# Patient Record
Sex: Female | Born: 1955 | Race: White | Hispanic: No | Smoking: Never smoker
Health system: Southern US, Community
[De-identification: ages and names within clinical notes are randomized; demographics above are authoritative.]

## PROBLEM LIST (undated history)

## (undated) ENCOUNTER — Emergency Department (HOSPITAL_BASED_OUTPATIENT_CLINIC_OR_DEPARTMENT_OTHER): Admission: EM | Payer: Medicare Other | Source: Home / Self Care

## (undated) DIAGNOSIS — D649 Anemia, unspecified: Secondary | ICD-10-CM

## (undated) DIAGNOSIS — R51 Headache: Secondary | ICD-10-CM

## (undated) DIAGNOSIS — T7840XA Allergy, unspecified, initial encounter: Secondary | ICD-10-CM

## (undated) DIAGNOSIS — K449 Diaphragmatic hernia without obstruction or gangrene: Secondary | ICD-10-CM

## (undated) DIAGNOSIS — F329 Major depressive disorder, single episode, unspecified: Secondary | ICD-10-CM

## (undated) DIAGNOSIS — Z8669 Personal history of other diseases of the nervous system and sense organs: Secondary | ICD-10-CM

## (undated) DIAGNOSIS — F419 Anxiety disorder, unspecified: Secondary | ICD-10-CM

## (undated) DIAGNOSIS — M2550 Pain in unspecified joint: Secondary | ICD-10-CM

## (undated) DIAGNOSIS — D329 Benign neoplasm of meninges, unspecified: Secondary | ICD-10-CM

## (undated) DIAGNOSIS — Z87898 Personal history of other specified conditions: Secondary | ICD-10-CM

## (undated) DIAGNOSIS — K8020 Calculus of gallbladder without cholecystitis without obstruction: Secondary | ICD-10-CM

## (undated) DIAGNOSIS — K279 Peptic ulcer, site unspecified, unspecified as acute or chronic, without hemorrhage or perforation: Secondary | ICD-10-CM

## (undated) DIAGNOSIS — R011 Cardiac murmur, unspecified: Secondary | ICD-10-CM

## (undated) DIAGNOSIS — Z8719 Personal history of other diseases of the digestive system: Secondary | ICD-10-CM

## (undated) DIAGNOSIS — M1990 Unspecified osteoarthritis, unspecified site: Secondary | ICD-10-CM

## (undated) DIAGNOSIS — M549 Dorsalgia, unspecified: Secondary | ICD-10-CM

## (undated) DIAGNOSIS — M2540 Effusion, unspecified joint: Secondary | ICD-10-CM

## (undated) DIAGNOSIS — K315 Obstruction of duodenum: Secondary | ICD-10-CM

## (undated) DIAGNOSIS — F32A Depression, unspecified: Secondary | ICD-10-CM

## (undated) DIAGNOSIS — IMO0002 Reserved for concepts with insufficient information to code with codable children: Secondary | ICD-10-CM

## (undated) DIAGNOSIS — K219 Gastro-esophageal reflux disease without esophagitis: Secondary | ICD-10-CM

## (undated) DIAGNOSIS — Z8711 Personal history of peptic ulcer disease: Secondary | ICD-10-CM

## (undated) DIAGNOSIS — R519 Headache, unspecified: Secondary | ICD-10-CM

## (undated) HISTORY — PX: REPLACEMENT TOTAL KNEE: SUR1224

## (undated) HISTORY — DX: Headache, unspecified: R51.9

## (undated) HISTORY — DX: Anemia, unspecified: D64.9

## (undated) HISTORY — PX: COLONOSCOPY: SHX174

## (undated) HISTORY — DX: Obstruction of duodenum: K31.5

## (undated) HISTORY — PX: DIAGNOSTIC LAPAROSCOPY: SUR761

## (undated) HISTORY — DX: Peptic ulcer, site unspecified, unspecified as acute or chronic, without hemorrhage or perforation: K27.9

## (undated) HISTORY — DX: Benign neoplasm of meninges, unspecified: D32.9

## (undated) HISTORY — DX: Cardiac murmur, unspecified: R01.1

## (undated) HISTORY — DX: Calculus of gallbladder without cholecystitis without obstruction: K80.20

## (undated) HISTORY — DX: Reserved for concepts with insufficient information to code with codable children: IMO0002

## (undated) HISTORY — DX: Diaphragmatic hernia without obstruction or gangrene: K44.9

## (undated) HISTORY — DX: Allergy, unspecified, initial encounter: T78.40XA

---

## 1998-11-28 ENCOUNTER — Emergency Department (HOSPITAL_COMMUNITY): Admission: EM | Admit: 1998-11-28 | Discharge: 1998-11-28 | Payer: Self-pay | Admitting: Emergency Medicine

## 1998-12-03 ENCOUNTER — Emergency Department (HOSPITAL_COMMUNITY): Admission: EM | Admit: 1998-12-03 | Discharge: 1998-12-03 | Payer: Self-pay | Admitting: Emergency Medicine

## 1999-02-27 ENCOUNTER — Encounter: Payer: Self-pay | Admitting: Orthopedic Surgery

## 1999-02-27 ENCOUNTER — Ambulatory Visit (HOSPITAL_COMMUNITY): Admission: RE | Admit: 1999-02-27 | Discharge: 1999-02-27 | Payer: Self-pay | Admitting: Orthopedic Surgery

## 2000-04-11 ENCOUNTER — Other Ambulatory Visit: Admission: RE | Admit: 2000-04-11 | Discharge: 2000-04-11 | Payer: Self-pay | Admitting: *Deleted

## 2001-04-16 ENCOUNTER — Encounter (INDEPENDENT_AMBULATORY_CARE_PROVIDER_SITE_OTHER): Payer: Self-pay | Admitting: Specialist

## 2001-04-16 ENCOUNTER — Ambulatory Visit (HOSPITAL_COMMUNITY): Admission: RE | Admit: 2001-04-16 | Discharge: 2001-04-16 | Payer: Self-pay | Admitting: Urology

## 2001-06-03 ENCOUNTER — Encounter (INDEPENDENT_AMBULATORY_CARE_PROVIDER_SITE_OTHER): Payer: Self-pay

## 2001-06-03 ENCOUNTER — Ambulatory Visit (HOSPITAL_COMMUNITY): Admission: RE | Admit: 2001-06-03 | Discharge: 2001-06-03 | Payer: Self-pay | Admitting: Obstetrics and Gynecology

## 2001-11-18 ENCOUNTER — Emergency Department (HOSPITAL_COMMUNITY): Admission: EM | Admit: 2001-11-18 | Discharge: 2001-11-18 | Payer: Self-pay | Admitting: Emergency Medicine

## 2001-11-18 ENCOUNTER — Encounter: Payer: Self-pay | Admitting: Emergency Medicine

## 2002-04-29 ENCOUNTER — Encounter: Admission: RE | Admit: 2002-04-29 | Discharge: 2002-04-29 | Payer: Self-pay | Admitting: Internal Medicine

## 2002-06-07 ENCOUNTER — Emergency Department (HOSPITAL_COMMUNITY): Admission: EM | Admit: 2002-06-07 | Discharge: 2002-06-08 | Payer: Self-pay | Admitting: *Deleted

## 2002-06-08 ENCOUNTER — Encounter: Payer: Self-pay | Admitting: Emergency Medicine

## 2002-06-09 ENCOUNTER — Encounter: Admission: RE | Admit: 2002-06-09 | Discharge: 2002-06-09 | Payer: Self-pay | Admitting: Internal Medicine

## 2002-06-10 ENCOUNTER — Encounter: Payer: Self-pay | Admitting: Emergency Medicine

## 2002-06-10 ENCOUNTER — Emergency Department (HOSPITAL_COMMUNITY): Admission: EM | Admit: 2002-06-10 | Discharge: 2002-06-10 | Payer: Self-pay | Admitting: Emergency Medicine

## 2002-06-14 ENCOUNTER — Encounter: Admission: RE | Admit: 2002-06-14 | Discharge: 2002-06-14 | Payer: Self-pay | Admitting: Internal Medicine

## 2002-06-15 ENCOUNTER — Ambulatory Visit (HOSPITAL_COMMUNITY): Admission: RE | Admit: 2002-06-15 | Discharge: 2002-06-15 | Payer: Self-pay | Admitting: Internal Medicine

## 2002-06-15 ENCOUNTER — Encounter: Payer: Self-pay | Admitting: Internal Medicine

## 2002-06-16 ENCOUNTER — Encounter: Admission: RE | Admit: 2002-06-16 | Discharge: 2002-06-16 | Payer: Self-pay | Admitting: Internal Medicine

## 2002-06-18 ENCOUNTER — Ambulatory Visit (HOSPITAL_COMMUNITY): Admission: RE | Admit: 2002-06-18 | Discharge: 2002-06-18 | Payer: Self-pay | Admitting: Internal Medicine

## 2002-06-18 ENCOUNTER — Encounter: Payer: Self-pay | Admitting: Internal Medicine

## 2002-06-30 ENCOUNTER — Encounter: Admission: RE | Admit: 2002-06-30 | Discharge: 2002-06-30 | Payer: Self-pay | Admitting: Internal Medicine

## 2002-07-19 ENCOUNTER — Encounter: Admission: RE | Admit: 2002-07-19 | Discharge: 2002-07-19 | Payer: Self-pay | Admitting: Internal Medicine

## 2002-07-21 ENCOUNTER — Encounter: Admission: RE | Admit: 2002-07-21 | Discharge: 2002-07-21 | Payer: Self-pay | Admitting: Internal Medicine

## 2002-08-19 ENCOUNTER — Encounter: Admission: RE | Admit: 2002-08-19 | Discharge: 2002-08-19 | Payer: Self-pay | Admitting: Internal Medicine

## 2002-09-08 ENCOUNTER — Emergency Department (HOSPITAL_COMMUNITY): Admission: EM | Admit: 2002-09-08 | Discharge: 2002-09-08 | Payer: Self-pay | Admitting: *Deleted

## 2002-09-29 ENCOUNTER — Encounter: Admission: RE | Admit: 2002-09-29 | Discharge: 2002-09-29 | Payer: Self-pay | Admitting: Internal Medicine

## 2002-10-07 ENCOUNTER — Encounter: Admission: RE | Admit: 2002-10-07 | Discharge: 2002-10-07 | Payer: Self-pay | Admitting: Internal Medicine

## 2002-12-02 ENCOUNTER — Encounter: Admission: RE | Admit: 2002-12-02 | Discharge: 2002-12-02 | Payer: Self-pay | Admitting: Internal Medicine

## 2002-12-22 ENCOUNTER — Encounter: Payer: Self-pay | Admitting: Internal Medicine

## 2002-12-22 ENCOUNTER — Encounter: Admission: RE | Admit: 2002-12-22 | Discharge: 2002-12-22 | Payer: Self-pay | Admitting: Internal Medicine

## 2003-01-03 ENCOUNTER — Encounter: Admission: RE | Admit: 2003-01-03 | Discharge: 2003-01-03 | Payer: Self-pay | Admitting: Internal Medicine

## 2003-01-12 ENCOUNTER — Encounter: Admission: RE | Admit: 2003-01-12 | Discharge: 2003-01-12 | Payer: Self-pay | Admitting: Internal Medicine

## 2003-02-04 ENCOUNTER — Ambulatory Visit (HOSPITAL_COMMUNITY): Admission: RE | Admit: 2003-02-04 | Discharge: 2003-02-04 | Payer: Self-pay | Admitting: *Deleted

## 2003-03-11 ENCOUNTER — Encounter: Admission: RE | Admit: 2003-03-11 | Discharge: 2003-03-11 | Payer: Self-pay | Admitting: Internal Medicine

## 2003-07-18 ENCOUNTER — Encounter: Admission: RE | Admit: 2003-07-18 | Discharge: 2003-07-18 | Payer: Self-pay | Admitting: Internal Medicine

## 2003-12-04 ENCOUNTER — Ambulatory Visit (HOSPITAL_COMMUNITY): Admission: RE | Admit: 2003-12-04 | Discharge: 2003-12-04 | Payer: Self-pay | Admitting: Internal Medicine

## 2004-02-28 ENCOUNTER — Ambulatory Visit (HOSPITAL_COMMUNITY): Admission: RE | Admit: 2004-02-28 | Discharge: 2004-02-28 | Payer: Self-pay | Admitting: *Deleted

## 2004-08-14 ENCOUNTER — Ambulatory Visit: Payer: Self-pay | Admitting: Internal Medicine

## 2004-08-27 ENCOUNTER — Ambulatory Visit: Payer: Self-pay | Admitting: Oncology

## 2004-09-18 ENCOUNTER — Ambulatory Visit: Payer: Self-pay | Admitting: Internal Medicine

## 2004-09-20 ENCOUNTER — Encounter (INDEPENDENT_AMBULATORY_CARE_PROVIDER_SITE_OTHER): Payer: Self-pay | Admitting: Specialist

## 2004-09-20 ENCOUNTER — Ambulatory Visit (HOSPITAL_COMMUNITY): Admission: RE | Admit: 2004-09-20 | Discharge: 2004-09-20 | Payer: Self-pay | Admitting: Oncology

## 2004-09-25 ENCOUNTER — Ambulatory Visit (HOSPITAL_COMMUNITY): Admission: RE | Admit: 2004-09-25 | Discharge: 2004-09-25 | Payer: Self-pay | Admitting: Oncology

## 2004-10-25 ENCOUNTER — Ambulatory Visit: Payer: Self-pay | Admitting: Internal Medicine

## 2004-11-22 ENCOUNTER — Ambulatory Visit: Payer: Self-pay | Admitting: Internal Medicine

## 2004-11-27 ENCOUNTER — Ambulatory Visit: Payer: Self-pay | Admitting: Internal Medicine

## 2004-12-18 ENCOUNTER — Ambulatory Visit (HOSPITAL_COMMUNITY): Admission: RE | Admit: 2004-12-18 | Discharge: 2004-12-18 | Payer: Self-pay | Admitting: Orthopedic Surgery

## 2005-09-30 ENCOUNTER — Ambulatory Visit (HOSPITAL_BASED_OUTPATIENT_CLINIC_OR_DEPARTMENT_OTHER): Admission: RE | Admit: 2005-09-30 | Discharge: 2005-09-30 | Payer: Self-pay | Admitting: Orthopedic Surgery

## 2006-06-03 ENCOUNTER — Encounter: Admission: RE | Admit: 2006-06-03 | Discharge: 2006-06-19 | Payer: Self-pay | Admitting: Orthopedic Surgery

## 2006-06-06 ENCOUNTER — Encounter: Admission: RE | Admit: 2006-06-06 | Discharge: 2006-06-06 | Payer: Self-pay | Admitting: Family Medicine

## 2006-06-23 ENCOUNTER — Emergency Department (HOSPITAL_COMMUNITY): Admission: EM | Admit: 2006-06-23 | Discharge: 2006-06-23 | Payer: Self-pay | Admitting: Emergency Medicine

## 2006-09-22 ENCOUNTER — Inpatient Hospital Stay (HOSPITAL_COMMUNITY): Admission: RE | Admit: 2006-09-22 | Discharge: 2006-09-25 | Payer: Self-pay | Admitting: Orthopedic Surgery

## 2006-10-11 ENCOUNTER — Emergency Department (HOSPITAL_COMMUNITY): Admission: EM | Admit: 2006-10-11 | Discharge: 2006-10-12 | Payer: Self-pay | Admitting: Emergency Medicine

## 2006-11-03 ENCOUNTER — Encounter: Admission: RE | Admit: 2006-11-03 | Discharge: 2006-11-19 | Payer: Self-pay | Admitting: Orthopedic Surgery

## 2007-02-19 ENCOUNTER — Observation Stay (HOSPITAL_COMMUNITY): Admission: EM | Admit: 2007-02-19 | Discharge: 2007-02-20 | Payer: Self-pay | Admitting: Emergency Medicine

## 2007-06-03 ENCOUNTER — Encounter: Admission: RE | Admit: 2007-06-03 | Discharge: 2007-08-10 | Payer: Self-pay | Admitting: Orthopedic Surgery

## 2007-07-07 ENCOUNTER — Encounter: Admission: RE | Admit: 2007-07-07 | Discharge: 2007-07-07 | Payer: Self-pay | Admitting: Family Medicine

## 2007-07-14 ENCOUNTER — Encounter: Admission: RE | Admit: 2007-07-14 | Discharge: 2007-07-14 | Payer: Self-pay | Admitting: Nephrology

## 2007-08-09 ENCOUNTER — Emergency Department (HOSPITAL_COMMUNITY): Admission: EM | Admit: 2007-08-09 | Discharge: 2007-08-09 | Payer: Self-pay | Admitting: Family Medicine

## 2007-08-10 ENCOUNTER — Emergency Department (HOSPITAL_COMMUNITY): Admission: EM | Admit: 2007-08-10 | Discharge: 2007-08-10 | Payer: Self-pay | Admitting: *Deleted

## 2008-05-29 ENCOUNTER — Emergency Department (HOSPITAL_COMMUNITY): Admission: EM | Admit: 2008-05-29 | Discharge: 2008-05-29 | Payer: Self-pay | Admitting: Family Medicine

## 2008-10-23 ENCOUNTER — Ambulatory Visit (HOSPITAL_BASED_OUTPATIENT_CLINIC_OR_DEPARTMENT_OTHER): Admission: RE | Admit: 2008-10-23 | Discharge: 2008-10-23 | Payer: Self-pay | Admitting: Otolaryngology

## 2008-10-30 ENCOUNTER — Ambulatory Visit: Payer: Self-pay | Admitting: Internal Medicine

## 2009-08-31 ENCOUNTER — Encounter: Admission: RE | Admit: 2009-08-31 | Discharge: 2009-08-31 | Payer: Self-pay | Admitting: Family Medicine

## 2010-01-12 ENCOUNTER — Emergency Department (HOSPITAL_COMMUNITY): Admission: EM | Admit: 2010-01-12 | Discharge: 2010-01-12 | Payer: Self-pay | Admitting: Family Medicine

## 2010-07-19 LAB — HM COLONOSCOPY

## 2010-08-09 ENCOUNTER — Emergency Department (HOSPITAL_COMMUNITY): Admission: EM | Admit: 2010-08-09 | Discharge: 2010-08-09 | Payer: Self-pay | Admitting: Emergency Medicine

## 2010-09-20 ENCOUNTER — Encounter
Admission: RE | Admit: 2010-09-20 | Discharge: 2010-09-20 | Payer: Self-pay | Source: Home / Self Care | Attending: Family Medicine | Admitting: Family Medicine

## 2010-10-02 ENCOUNTER — Encounter
Admission: RE | Admit: 2010-10-02 | Discharge: 2010-10-02 | Payer: Self-pay | Source: Home / Self Care | Attending: Family Medicine | Admitting: Family Medicine

## 2010-10-07 ENCOUNTER — Encounter: Payer: Self-pay | Admitting: Family Medicine

## 2010-10-07 ENCOUNTER — Encounter: Payer: Self-pay | Admitting: Internal Medicine

## 2011-01-29 NOTE — Procedures (Signed)
Lindsay Frost, DEVERS NO.:  [id number]   MEDICAL RECORD NO.:  [id number]          PATIENT TYPE:  OUT   LOCATION:  SLEEP CENTER                 FACILITY:  [facility]   PHYSICIAN:  Clinton D. Maple Hudson, MD, FCCP, FACPDATE OF BIRTH:  1956/06/08   DATE OF STUDY:  10/23/2008                            NOCTURNAL POLYSOMNOGRAM   REFERRING PHYSICIAN:  Onalee Hua L. Annalee Genta, M.D.   INDICATION FOR STUDY:  Hypersomnia with sleep apnea.  Epworth sleepiness  score 3/24, BMI 37.8, weight 220 pounds, height 64 inches, neck 13  inches.  Home medications are charted and reviewed.   SLEEP ARCHITECTURE:  Total sleep time 250 minutes with sleep efficiency  56.9%.  Stage I was 18.6%, stage II 80.2%, stage III 1.2%, REM absent.  Sleep latency 106 minutes, awake after sleep onset 83 minutes, arousal  index 50.5 indicating increased EEG arousals.  Sleep was marked by  frequent sleep stage changes and frequent awakenings.  On video review,  there was a great deal of incidental movement of arms, head, and body  position.  No bedtime medication was reported.  After arrival at the  laboratory, but apparently she had taken Cymbalta, bupropion, and  Crestor before arrival.   RESPIRATORY DATA:  Apnea/hypopnea index (AHI) 3.4 per hour.  A total of  14 events was scored, all as hypopneas associated with nonsupine sleep  position.   OXYGEN DATA:  Moderate snoring with oxygen desaturation to a nadir of  88%.  Mean oxygen saturation through the study was 95.9% on room air.   CARDIAC DATA:  Normal sinus rhythm.   MOVEMENTS/PARASOMNIA:  Although the patient made small adjustment  movements of arms, trunk, and face throughout the study, none that  scoring criteria for periodic limb movement associated with arousal.  Bathroom x1.   1. Fragmented sleep with frequent brief waking especially in the first      half of the night before 2 a.m.  2. Occasional respiratory event with sleep disturbance, AHI 3.4  per      hour (normal range 0-5 per hour).  Moderate snoring with oxygen      desaturation to a nadir of 88%.  Events were associated with sleep      in all positions, but recorded primarily as associated with      nonsupine sleep.  3. Consider primary management as insomnia.     Clinton D. Maple Hudson, MD, [facility], FACP  Diplomate, Biomedical engineer of Sleep Medicine  Electronically Signed    CDY/MEDQ  D:  10/29/2008 14:30:13  T:  10/29/2008 16:10:96  Job:  [id number]

## 2011-01-29 NOTE — Discharge Summary (Signed)
NAMEHENLI, Lindsay NO.:  [id number]   MEDICAL RECORD NO.:  [id number]          PATIENT TYPE:  OBV   LOCATION:  6529                         FACILITY:  [facility]   PHYSICIAN:  Nanetta Batty, M.D.   DATE OF BIRTH:  November 07, 1955   DATE OF ADMISSION:  02/19/2007  DATE OF DISCHARGE:  02/20/2007                               DISCHARGE SUMMARY   DISCHARGE DIAGNOSES:  1. Chest pain, probably gastroesophageal reflux disease - ruled out      for myocardial infarction by serial enzymes.  2. Migraine headaches.  3. History of vasodepressor syncope.  4. Family history of coronary artery disease.   HOSPITAL COURSE:  This is a 55 year old Caucasian female patient of Dr.  Allyson Sabal who presented to the emergency department at [facility]  with complaints of chest pain.  She has a history of vasodepressor  syncope with positive tilt table test in the past and also has a  positive family history of coronary artery disease.  She said that pain  started several weeks ago with activity, but denies any relation to  food.  Symptoms were short-lived and she noted that on the day of  presentation, the patient was walking in [place] and experienced severe  midsternal chest pain.  She rested, the pain improved, but did not  dissipate completely.  Also the patient complained of associated  shortness of breath, nausea, diaphoresis and some dizziness.  She  presented to the ER where she was evaluated and admitted to [facility] for rule-out MI protocol.   Her enzymes were cycled and all three tests revealed negative.  Her  enzymes were cycled and revealed a negative result times three.  D-dimer  was within normal limits.   The patient was started on proton pump inhibitor therapy and in the  morning when she was assessed by Dr. Jacinto Halim, she did not have any  significant complaints of chest pain.  Her EKG did not show any  abnormalities.  The patient was felt to be stable for  discharge home and  some of her medications were adjusted prior to discharge.   LABORATORY DATA:  Hospital laboratories:  CMET revealed sodium 142,  potassium 3.7, chloride 109, CO2 of 27, glucose 95, BUN 13, creatinine  0.82, and liver function tests were normal, magnesium 2.3.  CBC shows  white blood cell count 6.6, hemoglobin 12.0, hematocrit 35.9, platelet  count 265,000.   DISCHARGE MEDICATIONS:  Dr. Nadara Eaton recommended patient start on  propranolol for headaches so we gave a prescription for:  1. InnoPran XL 80 mg q.h.s.  2. Cymbalta 60 mg daily.  3. Wellbutrin 150 mg daily.  4. Protonix 40 mg daily.   PLAN:  The patient will be seen by Dr. Allyson Sabal in our office on March 10, 2007 at 4 PM.  If her migraine headaches will not improve, then patient  is to be on increased dose of propranolol or altogether calcium channel  blockers can be discontinued.   Also consideration should be given to a Lipo-Med profile and Niaspan  therapy if Lipo-Med profile reveals  abnormal results.      Raymon Mutton, P.A.      Nanetta Batty, M.D.  Electronically Signed    MK/MEDQ  D:  02/20/2007  T:  02/20/2007  Job:  [id number]

## 2011-02-01 NOTE — H&P (Signed)
[facility]  Patient:    Lindsay Frost, Lindsay Frost Visit Number: [id number] MRN: [id number]          Service Type: DSU Location: [facility] Attending Physician:  Wandalee Ferdinand Dictated by:   Rudy Jew Ashley Royalty, M.D. Admit Date:  06/03/2001                           History and Physical  HISTORY OF PRESENT ILLNESS:   This is a 55 year old, gravida 1, para 1, referred through the courtesy of Ammie Dalton, M.D. for an apparent right-sided ovarian cyst.  Pelvic ultrasound was obtained on March 31, 2001, which revealed a normal-size retroverted uterus.  The left ovary was 2.6 cm in greatest diameter, and the right ovary was 3.3 cm in greatest diameter and contained an area of increased echogenicity measuring approximately 1.5 cm. The conclusion at that time was a resolving hemorrhagic cyst.  Upon further questioning, the patient also stated her periods were every 30 days, heavy, and quite painful.  She uses Motrin and DC powders to obtain relief albeit inadequate.  She returned for sonohistogram and ultrasound pursing the possibility of a structural abnormality within the uterus as well as for followup regarding the ultrasound performed initially at [facility].  Ultrasound on May 27, 2001, revealed a 2.3 cm cyst of the right ovary with defined low-level echo pattern and also an echogenic excrescence measuring 10 mm in greatest diameter.  There was also a 1.8 cm cyst on the right ovary with a similar echogenic pattern.  The left ovary was unremarkable.  The sonohistogram revealed a 1.5 cm endometrial polyp.  The patient is hence for diagnostic/operative laparoscopy, diagnostic/operative hysteroscopy, and dilatation and curettage.  MEDICATIONS:                  Paxil, Allegra, Imitrex, Prevacid, hyoscyamine, cyclobenzaprine.  PAST MEDICAL HISTORY:         Medical:                               1. Depression.  2. Migraines.                               3. Peptic ulcer disease.                               4. Back pain treated by Lindaann Slough, M.D.  ALLERGIES:                    None.  FAMILY HISTORY:               Positive for hypertension, diabetes.  SOCIAL HISTORY:               The patient denies use of tobacco or significant alcohol.  REVIEW OF SYSTEMS:            Noncontributory.  PHYSICAL EXAMINATION:  GENERAL:                      Well-developed, well-nourished, pleasant white female in no acute distress.  VITAL SIGNS:                  Afebrile, vital signs stable.  SKIN:  Warm and dry without lesions.  LYMPH:                        There are no supraclavicular, cervical, or inguinal adenopathy.  HEENT:                        Normocephalic.  NECK:                         Supple without thyromegaly.  CHEST:                        Lungs are clear.  CARDIAC:                      Regular rate and rhythm without murmurs, gallops, or rubs.  BREASTS:                      Exam deferred.  ABDOMEN:                      Soft and nontender without masses or organomegaly.  Bowel sounds are active.  MUSCULOSKELETAL:              Full range of motion without edema, cyanosis, or CVA tenderness.  PELVIC:                       Deferred until examination under anesthesia.  IMPRESSION: 1. Right ovarian cyst or cysts, persistent.  Differential includes benign    ovarian neoplasm, endometriosis, paraovarian cyst, etc.  Doubt malignant    neoplasm. 2. Endometrial polyp at sonohysterogram. 3. Menorrhagia probably secondary to #2.  PLAN:                         1. Diagnostic/operative laparoscopy.                               2. Diagnostic/operative hysteroscopy.                               3. Dilatation and curettage.  Risks, benefits, complications, and alternatives fully discussed with the patient.  Possible need for unilateral salpingo-oophorectomy  discussed and accepted.  Possible need for full exploratory laparotomy discussed and accepted.  Questions invited and answered. Dictated by:   Rudy Jew Ashley Royalty, M.D. Attending Physician:  Wandalee Ferdinand DD:  06/03/01 TD:  06/03/01 Job: [id number]

## 2011-02-01 NOTE — Op Note (Signed)
[facility]  Patient:    Lindsay Frost, Lindsay Frost Visit Number: [id number] MRN: [id number]          Service Type: DSU Location: [facility] Attending Physician:  Wandalee Ferdinand Dictated by:   Rudy Jew Ashley Royalty, M.D. Proc. Date: 06/03/01 Admit Date:  06/03/2001                             Operative Report  PREOPERATIVE DIAGNOSES:       1. Right ovarian cyst or cysts--persistent.                               2. Endometrial polyp.                               3. Menorrhagia--probably secondary to                                  endometrial polyp.  POSTOPERATIVE DIAGNOSES:      1. Right ovarian cyst--probably physiologic.                               2. Pelvic adhesions from the omentum and colon                                  to the posterior aspect of the uterus and                                  cul-de-sac.                               3. Endometrial polyp.                               4. Possible submucosal fibroid, pathology                                  pending.  PROCEDURE:                    1. Diagnostic/operative hysteroscopy.                               2. Dilatation and curettage.                               3. Diagnostic laparoscopy.  SURGEON:                      Rudy Jew. Ashley Royalty, M.D.  ANESTHESIA:                   General.  ESTIMATED BLOOD LOSS:         Less than 50 cc.  COMPLICATIONS:                None.  PACKS AND DRAINS:             None.  DESCRIPTION OF PROCEDURE:     The patient was taken to the operating room and placed in the dorsal supine position. After adequate general anesthesia was administered, she was placed in the lithotomy position and prepped and draped in the usual manner for abdominal and vaginal surgery. A posterior weighted retractor was placed per vagina. The anterior lip of the cervix was grasped with a single-tooth tenaculum. The uterus was gently sounded to approximately 8 cm. The cervix  was then serially dilated to a size 29 [place] using [employer] dilators. The resectoscope was then placed into the uterine cavity. Sorbitol was used as the distention medium. The cavity was explored in its entirety and appropriate photos obtained. The region of the tubal ostia were seen bilaterally. There was an apparent polyp on the left anterior aspect of the uterine fundus which was excised using the resectoscope with 70 watts cutting waveform. The remainder of the cavity was thoroughly inspected. There was a hard area on the anterior aspect of the fundus to the right of the previous area and also involving the midline. This felt consistent with but not diagnostic of a submucosal fibroid. Using the same power setting, this area was shaved such that is was flush with the remainder of the uterine cavity. The shavings were submitted to pathology separate from the first specimen for histologic studies.  Next, a uterine curettage was performed. First, a four-quadrant technique was used. Then, a therapeutic technique was used. Next, hemostasis was obtained using the coagulation waveform at approximately 60 watts power. Hemostasis was noted and this portion of the procedure terminated. The posterior weighted retractor was replaced. Jarcho uterine manipulator was placed per cervix and held in place with the tenaculum. The bladder was drained with a red rubber catheter. Next, a 1.5 cm infraumbilical incision was made in the longitudinal plane. Disposable laparoscopic trocar was placed into the abdominal cavity. Its location was verified by placement of the laparoscope. There was no evidence of any trauma. The pelvic organs were visualized. Suprapubic trocars, 5 mm diameter, were placed in the left and right lower quadrants, respectively. Transillumination and direct visualization techniques were employed to avoid any trauma. Further exploration was conducted. The fundus appeared larger than normal  with perhaps a subserosal fibroid present. This did not seem to distort the overall contour of the uterus but simply looked more protuberant in the corpus than normal. Immediately upon manipulating the pelvic contents, adhesions were noted from the colon to the posterior cul-de-sac and the posterior aspect of the uterus. These all but obliterated the ability to see the adnexa, however, with careful manipulation, the adnexa were visualized satisfactorily. There was approximately a 1 cm cyst on the right ovary which appeared probably physiologic. The right ovarian surface was smooth and glistening. There were no excrescences whatsoever. The fallopian tube on the right side appeared normal as well. The left fallopian tube appeared normal size, shape, and contour. The fimbria were noted to be normal. The left ovary was normal size, shape, and contour without evidence of any cysts or endometriosis. Due to the fact that the adhesions to the posterior aspect of the uterus were quite dense and involving the colon, the decision was made to avoid any further manipulation in this operative setting, but simply to follow the patient clinically postoperatively. Abdominal instruments were then removed and the pneumoperitoneum evacuated. Fascial defects were closed using O Vicryl in interrupted  fashion. The skin was closed with 3-0 chromic in a subcuticular fashion. The vaginal instruments were removed and the procedure terminated. The patient tolerated the procedure extremely well and was returned to the recovery room in good condition.    Dictated by: Rudy Jew Ashley Royalty, M.D. Attending Physician:  Wandalee Ferdinand DD:  06/03/01 TD:  06/03/01 Job: [id number]

## 2011-02-01 NOTE — Op Note (Signed)
[facility]  Patient:    Lindsay Frost                      MRN: [id number] Proc. Date: 04/16/01 Attending:  Lindaann Slough, M.D. CC:         Ammie Dalton, M.D.   Operative Report  PREOPERATIVE DIAGNOSES:  Left hydronephrosis, microhematuria.  POSTOPERATIVE DIAGNOSES:  No hydronephrosis and microhematuria and meatal stenosis.  PROCEDURE DONE:  Cystoscopy, left retrograde pyelogram, and meatal dilation.  SURGEON:  Lindaann Slough, M.D.  ANESTHESIA:  General.  INDICATION:  The patient is a 55 year old female, who was found on urinalysis to have microhematuria.  CT scan showed possible left hydronephrosis.  Renal ultrasound showed the same findings.  She is scheduled today for cystoscopy and retrograde pyelogram.  DESCRIPTION OF PROCEDURE:  Under general anesthesia, the patient was prepped and draped and placed in the dorsal lithotomy position.  A #22 Wappler cystoscope could not be passed in the bladder because of meatal stenosis.  The urethra was then dilated up to a #32 [place].  Then the cystoscope was passed in the bladder.  The bladder mucosa was normal.  There is no stone or tumor in the bladder.  The ureteral orifices were normal in position in shape with clear efflux.  Then a cone-tip catheter was passed through the cystoscope into the left ureter.  Contrast was then injected through the cone-tip catheter. The ureter was normal.  The collecting system was normal.  There was no evidence of hydronephrosis.  There was no evidence of filling defect in the ureter nor in the collecting system.  There was good drainage of the collecting system.  The cone-tip catheter was then removed.  The bladder was irrigated with normal saline, and bladder washings were sent for cytology. The bladder was then emptied and the cystoscope removed.  The patient tolerated the procedure well and left the OR in satisfactory condition to postanesthesia care  unit. DD:  04/16/01 TD:  04/16/01 Job: [id number]

## 2011-02-01 NOTE — Op Note (Signed)
NAMEJAVON, Lindsay Frost NO.:  [id number]   MEDICAL RECORD NO.:  [id number]          PATIENT TYPE:  AMB   LOCATION:  DSC                          FACILITY:  [facility]   PHYSICIAN:  Feliberto Gottron. Turner Daniels, M.D.   DATE OF BIRTH:  03-20-1956   DATE OF PROCEDURE:  09/30/2005  DATE OF DISCHARGE:                                 OPERATIVE REPORT   PREOPERATIVE DIAGNOSIS:  Medial meniscal tear of right knee, possible  chondromalacia.   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   PROCEDURE:  Right knee arthroscopic partial medial meniscectomy and  debridement chondromalacia.   SURGEON:  Feliberto Gottron. Turner Daniels, MD   FIRST ASSISTANT:  Skip Mayer, PA-C.   ANESTHETIC:  General LMA.   ESTIMATED BLOOD LOSS:  Minimal.   FLUID REPLACEMENT:  700 mL crystalloid.   DRAINS PLACED:  None.   TOURNIQUET TIME:  None.   INDICATIONS FOR PROCEDURE:  A 55 year old woman with persistent right knee  pain who has failed conservative treatment with anti-inflammatory medicines,  exercises and attempts at weight loss MRI scan is consistent with  chondromalacia. They did not really see much in the way of a meniscal tear,  but she is quite tender along the medial joint line.  She does have some  loss of cartilage on the plain x-rays, but she is not bone-on-bone. The  risks and benefits of surgery discussed with her. She desires elective  arthroscopic evaluation and treatment of her right knee.   DESCRIPTION OF PROCEDURE:  The patient was identified by armband, taken the  operating room at [facility] day surgery center where appropriate anesthetic  monitors were attached and general LMA anesthesia induced with the patient  in supine position.  A lateral post was applied to the table and the right  lower extremity  prepped and draped in usual sterile fashion from the ankle  to the midthigh.  We then performed a local block using 15 mL of  0.5%  Marcaine and epinephrine solution in the inferomedial, inferolateral  peripatellar portal region and using a #11 blade, made standard  inferolateral, inferomedial peripatellar portals. Diagnostic arthroscopy  revealed focal grade 3 chondromalacia of the apex of the patella, trochlea,  and the medial femoral condyle. These areas were all debrided with some  small flap tears along the trochlea. Moving into the medial compartment, a  posterior horn medial meniscal tear was identified requiring removal of the  inner third of the posterior horn of the medial meniscus. The medial tibial  plateau had global grade 3 and focal grade 4 chondromalacia which was also  debrided.  The ACL and PCL were intact. On the lateral side, there is a  small amount of degenerative tearing of the lateral meniscus and this  was incidentally debrided.  The gutters were cleared medially and laterally.  The knee was irrigated out with normal saline solution. The arthroscopic  instruments  were removed and a dressing of Xeroform, 4x4 dressing sponges,  Webril and Ace wrap applied. The patient was awakened and taken to the  recovery room without difficulty.      Feliberto Gottron. Turner Daniels, M.D.  Electronically Signed     FJR/MEDQ  D:  09/30/2005  T:  09/30/2005  Job:  [id number]

## 2011-02-01 NOTE — Cardiovascular Report (Signed)
NAME:  Lindsay Frost, Lindsay Frost                         ACCOUNT NO.:  [id number]   MEDICAL RECORD NO.:  [id number]                   PATIENT TYPE:  OIB   LOCATION:  2899                                 FACILITY:  [facility]   PHYSICIAN:  Darlin Priestly, M.D.             DATE OF BIRTH:  02/06/1956   DATE OF PROCEDURE:  02/28/2004  DATE OF DISCHARGE:  02/28/2004                              CARDIAC CATHETERIZATION   PROCEDURE PERFORMED:  Heads up tilt table testing.   CARDIOLOGIST:  Darlin Priestly, M.D.   COMPLICATIONS:  None.   INDICATIONS:  Ms. Swayze is a 56 year old, patient of Dr. Ricki Miller and Dr.  Nanetta Batty, with a history of recurrent syncope.  She had a negative  Cardiolite scan.  She is now referred for tilt table testing to rule out new  cardiogenic syncope.   DESCRIPTION OF PROCEDURE:  After getting informed written consent the  patient was brought to the cardiac cath lab in the fasting state.  The  patient was placed in the supine position and the hemodynamic measurements  were obtained.  Resting blood pressure 124/68 and respiratory rate 69.  The  patient was monitored for approximately five minutes and then tilted in the  70-degree heads up position.  After approximately 30 minutes in the heads up  position the patient became nauseated, diaphoretic and again vomiting.  Blood pressure dropped to 65/40and ultimately to 22/16.  The patient was  returned to a supine position, but had no loss of consciousness.  She  quickly regained blood pressure again to 106/56.  She was monitored for  approximately 15 minutes following the episode and remained hemodynamically  stable.   The patient was disconnected from the monitor and then transferred to the  recovery room in stable condition.   CONCLUSION:  Positive heads up tilt table testing.                                               Darlin Priestly, M.D.    RHM/MEDQ  D:  02/28/2004  T:  02/28/2004  Job:  [id number]   cc:    Juline Patch, M.D.  [address]  [place], [place] [address]  Fax: [phone]   Nanetta Batty, M.D.  Fax: [phone]

## 2011-02-01 NOTE — Discharge Summary (Signed)
Lindsay Frost, Lindsay Frost NO.:  [id number]   MEDICAL RECORD NO.:  [id number]          PATIENT TYPE:  INP   LOCATION:  5019                         FACILITY:  [facility]   PHYSICIAN:  Erskine Squibb B. Su Hilt, P.A.  DATE OF BIRTH:  12-23-1955   DATE OF ADMISSION:  09/22/2006  DATE OF DISCHARGE:  09/25/2006                               DISCHARGE SUMMARY   DATE OF ADMISSION:  09/22/2006   DATE OF DISCHARGE:  09/25/2006.   PRIMARY DIAGNOSIS:  End-stage DJD of the right knee.   PROCEDURE IN HOSPITAL:  Right total knee arthroplasty.   DISCHARGE SUMMARY:  The patient is a 55 year old woman who was injured  at work and sustained cartilage tears and chondromalacia of the right  knee.  She had been treated appropriately and conservatively with anti-  inflammatory medications, physical therapy, cortisone injections, and  knee arthroscopy revealing grade 4 chondromalacia of the medial  compartment of the knee consistent with end-stage arthritis.  Because of  the patient's persistent pain and disability, she desires total knee  elective arthroplasty.  The risks and benefits of surgery were  discussed, and questions were answered.  The patient has no known drug  allergies.   MEDICATIONS AT ADMISSION:  Cymbalta, Wellbutrin, omeprazole, Skelaxin,  Imitrex, and she had recently discontinued BC powders and ibuprofen.   PAST MEDICAL HISTORY:  Childhood diseases, adult history DJD, peptic  ulcer disease.   PAST SURGICAL HISTORY:  Knee scope, cystoscope, laparoscopic and  difficulty with __________.   SOCIAL HISTORY:  No tobacco, no ethanol, no intravenous drug abuse.  She  is a Naval architect and divorced.   FAMILY HISTORY:  Her mother is alive at 5 with positive coronary artery  disease and hypertension.  Father died at age 7 with a history of  coronary artery disease, kidney failure, and diabetes.   REVIEW OF SYSTEMS:  Positive for glasses, morning cough, and depression.  She denies  any shortness of breath, chest pain, or recent illness.   PHYSICAL EXAMINATION:  VITAL SIGNS:  Temperature 98, pulse 72,  respirations 18, blood pressure 127/85.  The patient is a 5 feet 4 inch  198-pound female.  HEENT:  Head is normocephalic and atraumatic.  Ears:  TMs are clear.  Eyes:  Pupils are round and reactive to light and accommodation.  Nose:  Patent.  Throat:  Benign.  Neck:  Supple.  Full range of motion  CHEST:  Clear to auscultation and percussion.  HEART:  Regular rate and rhythm.  ABDOMEN:  Soft and nontender.  No masses.  Bowel sounds are 2+.  EXTREMITIES:  Within normal limits with the exception of the right knee  with range of motion applied to 95 degrees, positive crepitus, trace  effusion, and stable ligaments.  SKIN:  Within normal limits.   Preoperative x-rays show bone on bone changes in the medial compartment,  plus there are the intraoperative radiographs showing grade 4  chondromalacia.   Preoperative labs including CBC, CMET, chest x-ray, EKG, PT and PTT were  all within normal limits.   HOSPITAL COURSE:  On the day of  admission, the patient was taken to the  operating room at [facility] where she underwent a right total  knee arthroplasty using DePuy Sigma RP components, #3 femur, #3 tibia,  10-mm PS spacer, and a 32-mm all-poly patellar button.  Components were  cemented.  Hemovac drain was placed double-armed to the wound  perioperatively.  The patient was placed on preoperative antibiotic  coverage.  She was placed on postoperative Coumadin prophylaxis with a  target INR of 1.5 to 2 per pharmacy protocol with bridging Lovenox  therapy.  The patient was begun on physical therapy in the recovery room  using a CPM.  The patient was placed on a PCA Dilaudid pump for pain  control.  On postoperative day 1, the patient was awake and alert.  No  nausea or vomiting, taking p.o. well.  Vital signs were stable.  She was  afebrile.  Drain output was  200 mL.  It had become quiescent __________.  Urine output 600 mL.  Hemoglobin 10.6, WBC 6.8.  She was neurovascularly  intact and dressing was dry.  INR 1.0.  She continued with physical  therapy, weightbearing as tolerated, as well as with the CPM and  continued Coumadin.  Discharge planning was begun.  On postoperative day  2, the patient was complaining of moderate pain.  She was afebrile.  Hemoglobin 10.1, WBC 7.2, INR 1.4.  She was otherwise stable.  Her IV  was discontinued as was her PCA.  She continued with PT.  On  postoperative day 3, the patient was without complaint, had her way out  to the hallway with physical therapy's help, and had begun doing  transfers independently.  Hemoglobin 9.4, WBC 6.9.  She was afebrile.  INR 3.0.  Dressing was dry.  No signs of bleeding from the elevated INR.  Calf was soft and nontender.  She was otherwise neurovascularly intact  and medically stable and orthopedically improved.  She was discharged  home having met physical therapy goals.  She will have home PT, home  health RN for blood draws, CPM, and durable medical equipment, Coumadin  per pharmacy protocol times 2 weeks with a target INR of 1.5 to 2,  Robaxin and Mepergan Fortis were both prescribed.  Her home med rec  sheet was reconciled, and she will continue on all of her home meds  other than the ibuprofen and the [facility] powders.  Diet is regular.  Activity:  Weightbearing as tolerated with total knee precautions.  She  will continue with incentive spirometry.  She will return to see [place] in 1  week's time, sooner if she is having increase in temperature, drainage,  or pain from the wound.      Laural Benes. Judithe Modest  D:  11/17/2006  T:  11/17/2006  Job:  [id number]

## 2011-02-01 NOTE — Op Note (Signed)
[name], Lindsay Frost NO.:  [id number]   MEDICAL RECORD NO.:  [id number]          PATIENT TYPE:  INP   LOCATION:  2550                         FACILITY:  [facility]   PHYSICIAN:  Lindsay Frost. Lindsay Frost, M.D.   DATE OF BIRTH:  1956/03/13   DATE OF PROCEDURE:  09/22/2006  DATE OF DISCHARGE:                               OPERATIVE REPORT   PREOPERATIVE DIAGNOSIS:  End-stage arthritis of the right knee with  varus deformity.   POSTOPERATIVE DIAGNOSIS:  End-stage arthritis of the right knee with  varus deformity.   PROCEDURE:  Right total knee arthroplasty using DePuy Sigma RP  components, a #3 femur, a #3 tibia, 10 mm PS spacer and a 32-mm all poly  patellar button.   SURGEON:  Lindsay Frost. Lindsay Frost, M.D.   FIRST ASSISTANT:  Skip Mayer PA-C   SECOND ASSISTANT:  Shawna Clamp PA student.   ANESTHETIC:  General endotracheal.   ESTIMATED BLOOD LOSS:  Minimal.   FLUID REPLACEMENT:  12 mL crystalloid.   URINE OUTPUT:  300 mL.   TOURNIQUET TIME:  1 hour 15 minutes.   INDICATIONS FOR PROCEDURE:  The patient is a 55 year old woman injured  at work who sustained cartilage tears and chondromalacia to her right  knee.  She has been treated appropriately and conservatively with anti-  inflammatory medicines, physical therapy, cortisone injections, recently  had a knee arthroscopy which revealed grade 4 chondromalacia of the  medial compartment of the knee consistent with end-stage arthritis  because of persistent pain and disability.  She desires elective total  knee arthroplasty.  Risks and benefits of surgery discussed  preoperatively and questions answered.   DESCRIPTION OF PROCEDURE:  The patient identified by armband and in the  block area received a femoral nerve block to her right leg.  She was  then taken to the operative suite [facility].  Appropriate site  monitors were attached and general endotracheal anesthesia induced with  the patient supine position.   Tourniquet applied high to the right thigh  and a right lower extremity prepped and draped in usual sterile fashion  from the ankle to the midthigh after first placing a lateral post and  foot positioner on the table.  The limb was wrapped with an Esmarch  bandage, tourniquet inflated to 350 mmHg and prior to this she did  receive IV antibiotics we made an anterior midline incision 18 cm in  length, centered over the patella through the skin and subcutaneous  tissue down to the level the transverse retinaculum which was cut in  line with skin incision and reflected medially allowing a medial  parapatellar arthrotomy.  The prepatellar fat pad was resected the  superficial medial collateral ligament was elevated off the proximal  medial tibial plateau, kept intact distally, and we peeled it around to  the semimembranosus insertion to loosen up to medial side of the knee.  The patella was then everted the knee was hyperflexed and the tibia  translated forward with a posterior medial Z retractor notch osteophytes  were removed the ACL and PCL resected with electrocautery and  the tibia  was translated further anteriorly with a McHale retractor through the  notch and a lateral Homan retractor.  We then entered the proximal tibia  in the center with the DePuy step drill followed by intramedullary rod  and 0 degrees posterior slope cutting guide was pinned into place  allowing resection of 7 to 8 mm of bone medially and 10 mm of bone  laterally.  Satisfied with a proximal tibial resection which was  accomplished with a power saw, we then entered the distal femur 2 mm  anterior to the PCL origin with a step drill, followed by intramedullary  rod and 5 degree right distal femoral cutting guide pinned along the  epicondylar axis.  We then resected 11 mm of the distal femur and sized  for a #3 femoral component with the sizing guide and pinned it in 3  degrees of external rotation.  The chamfer  cutting guide was then  hammered into place.  The anterior-posterior chamfer cuts were  accomplished without difficulty followed by the DePuy Sigma RP box cut.  We measured the patella at 22.5 mm and set up to cut for a 32 mm  patellar button setting the cutting guide at 15 mm and resecting the  posterior 7.5 to 8 mm of the patella.  This was then drilled for the 32  mm patellar button and with the knee hyperflexed.  We sized the tibia  for a #3 tibial baseplate which was pinned into place followed by the  smokestack and the conical reamers set on forward followed by the Delta  Fit keel punch with a post for the trial tibial bearing.  We then  hammered into place a #3 right femoral trial, snapped in a 10 mm PS  trial bearing.  The knee was reduced with a 32-mm trial patellar button.  She did come to full extension.  The ligaments were stable and she  flexed to about 120 degrees limited by adipose tissue.  At this point  the trial components were removed.  All bony surfaces were water picked  clean and dried with suction sponges.  At the back table a double batch  of DePuy fast set polymethyl methacrylate cement with 1500 mg Zinacef  was mixed and applied to all bony and metallic mating surfaces except  for the posterior condyles of the femur itself.  In order we hammered  into place a #3 tibial baseplate and removed excess cement, a #3 right  femoral component and removed excess cement and we clamped the 32 mm  patellar button in place and removed excess cement.  The real 10 mm PS  spacer was then placed into the tibial tray and the knee reduced and  held in extension to apply compression to the cement mantle and also  allowing removal of any further excess cement.  Once again the wound was  water picked clean and dried with suction.  Medium Hemovac drains were  placed deep in the wound.  The patella was reduced, the knee taken through range of motion and tracking of the patella was  obtained without  any thumb pressure.  At this point the medial parapatellar arthrotomy  was closed with running #1 Vicryl suture.  The subcutaneous tissue with  running 0-0 and 2-0 undyed Vicryl suture and the skin with skin staples.  A dressing of Xeroform, 4x4 dressing sponges, Webril and Ace wrap  applied.  The tourniquet was let down and the patient was awakened and  taken to the recovery room without difficulty.      Lindsay Frost. Lindsay Frost, M.D.  Electronically Signed     FJR/MEDQ  D:  09/22/2006  T:  09/22/2006  Job:  [id number]

## 2011-04-19 ENCOUNTER — Other Ambulatory Visit: Payer: Self-pay

## 2011-04-19 ENCOUNTER — Other Ambulatory Visit: Payer: Self-pay | Admitting: Family Medicine

## 2011-04-19 ENCOUNTER — Ambulatory Visit
Admission: RE | Admit: 2011-04-19 | Discharge: 2011-04-19 | Disposition: A | Discharge status: Home / Self Care | Payer: Medicare Other | Source: Ambulatory Visit | Attending: Family Medicine | Admitting: Family Medicine

## 2011-04-19 DIAGNOSIS — M545 Low back pain, unspecified: Secondary | ICD-10-CM

## 2011-07-04 LAB — COMPREHENSIVE METABOLIC PANEL
ALT: 10
AST: 16
Albumin: 3.6
Alkaline Phosphatase: 78
BUN: 13
CO2: 27
Calcium: 9
Chloride: 109
Creatinine, Ser: 0.82
GFR calc Af Amer: >60
GFR calc non Af Amer: >60
Glucose, Bld: 95
Potassium: 3.7
Sodium: 142
Total Bilirubin: 0.4
Total Protein: 6.2

## 2011-07-04 LAB — TROPONIN I
Troponin I: 0.01
Troponin I: 0.01

## 2011-07-04 LAB — CK TOTAL AND CKMB (NOT AT ARMC)
CK, MB: 0.6
CK, MB: 0.6
Relative Index: INVALID
Relative Index: INVALID
Total CK: 50
Total CK: 64

## 2011-07-04 LAB — BASIC METABOLIC PANEL
BUN: 13
CO2: 24
Calcium: 9
Chloride: 110
Creatinine, Ser: 0.66
GFR calc Af Amer: >60
GFR calc non Af Amer: >60
Glucose, Bld: 93
Potassium: 3.4 — ABNORMAL LOW
Sodium: 142

## 2011-07-04 LAB — POCT CARDIAC MARKERS
CKMB, poc: <1 — ABNORMAL LOW
Myoglobin, poc: 31.1
Operator id: 272551
Troponin i, poc: <0.05

## 2011-07-04 LAB — LIPID PANEL
Cholesterol: 195
HDL: 38 — ABNORMAL LOW
LDL Cholesterol: 143 — ABNORMAL HIGH
Total CHOL/HDL Ratio: 5.1
Triglycerides: 72
VLDL: 14

## 2011-07-04 LAB — DIFFERENTIAL
Basophils Absolute: 0.1
Basophils Relative: 1
Eosinophils Absolute: 0.1
Eosinophils Relative: 2
Lymphocytes Relative: 29
Lymphs Abs: 1.9
Monocytes Absolute: 0.6
Monocytes Relative: 10
Neutro Abs: 3.8
Neutrophils Relative %: 58

## 2011-07-04 LAB — CBC
HCT: 35.9 — ABNORMAL LOW
Hemoglobin: 12
MCHC: 33.5
MCV: 85.4
Platelets: 265
RBC: 4.21
RDW: 15.5 — ABNORMAL HIGH
WBC: 6.6

## 2011-07-04 LAB — PROTIME-INR
INR: 0.9
Prothrombin Time: 12.7

## 2011-07-04 LAB — D-DIMER, QUANTITATIVE: D-Dimer, Quant: 0.41

## 2011-07-04 LAB — H. PYLORI ANTIBODY, IGG: H Pylori IgG: 0.4

## 2011-07-04 LAB — APTT: aPTT: 28

## 2011-07-04 LAB — MAGNESIUM: Magnesium: 2.3

## 2011-07-04 LAB — HEMOGLOBIN A1C: Hgb A1c MFr Bld: 5.2

## 2011-07-04 LAB — TSH: TSH: 3.09

## 2011-11-19 DIAGNOSIS — J069 Acute upper respiratory infection, unspecified: Secondary | ICD-10-CM | POA: Diagnosis not present

## 2011-11-19 DIAGNOSIS — R112 Nausea with vomiting, unspecified: Secondary | ICD-10-CM | POA: Diagnosis not present

## 2012-01-29 DIAGNOSIS — R1013 Epigastric pain: Secondary | ICD-10-CM | POA: Diagnosis not present

## 2012-01-29 DIAGNOSIS — E7800 Pure hypercholesterolemia, unspecified: Secondary | ICD-10-CM | POA: Diagnosis not present

## 2012-01-29 DIAGNOSIS — K219 Gastro-esophageal reflux disease without esophagitis: Secondary | ICD-10-CM | POA: Diagnosis not present

## 2012-01-29 DIAGNOSIS — R5383 Other fatigue: Secondary | ICD-10-CM | POA: Diagnosis not present

## 2012-01-29 DIAGNOSIS — R5381 Other malaise: Secondary | ICD-10-CM | POA: Diagnosis not present

## 2012-02-18 LAB — HM COLONOSCOPY: HM Colonoscopy: NORMAL

## 2012-03-03 DIAGNOSIS — M25559 Pain in unspecified hip: Secondary | ICD-10-CM | POA: Diagnosis not present

## 2012-03-03 DIAGNOSIS — M1710 Unilateral primary osteoarthritis, unspecified knee: Secondary | ICD-10-CM | POA: Diagnosis not present

## 2012-03-09 DIAGNOSIS — M25569 Pain in unspecified knee: Secondary | ICD-10-CM | POA: Diagnosis not present

## 2012-03-09 DIAGNOSIS — M1710 Unilateral primary osteoarthritis, unspecified knee: Secondary | ICD-10-CM | POA: Diagnosis not present

## 2012-03-18 DIAGNOSIS — M1710 Unilateral primary osteoarthritis, unspecified knee: Secondary | ICD-10-CM | POA: Diagnosis not present

## 2012-03-18 DIAGNOSIS — M25569 Pain in unspecified knee: Secondary | ICD-10-CM | POA: Diagnosis not present

## 2012-03-23 DIAGNOSIS — K208 Other esophagitis without bleeding: Secondary | ICD-10-CM | POA: Diagnosis not present

## 2012-03-23 DIAGNOSIS — K269 Duodenal ulcer, unspecified as acute or chronic, without hemorrhage or perforation: Secondary | ICD-10-CM | POA: Diagnosis not present

## 2012-03-23 DIAGNOSIS — R1013 Epigastric pain: Secondary | ICD-10-CM | POA: Diagnosis not present

## 2012-03-31 DIAGNOSIS — K2990 Gastroduodenitis, unspecified, without bleeding: Secondary | ICD-10-CM | POA: Diagnosis not present

## 2012-03-31 DIAGNOSIS — K315 Obstruction of duodenum: Secondary | ICD-10-CM | POA: Diagnosis not present

## 2012-03-31 DIAGNOSIS — K259 Gastric ulcer, unspecified as acute or chronic, without hemorrhage or perforation: Secondary | ICD-10-CM | POA: Diagnosis not present

## 2012-03-31 DIAGNOSIS — R109 Unspecified abdominal pain: Secondary | ICD-10-CM | POA: Diagnosis not present

## 2012-03-31 DIAGNOSIS — K222 Esophageal obstruction: Secondary | ICD-10-CM | POA: Diagnosis not present

## 2012-03-31 DIAGNOSIS — K2970 Gastritis, unspecified, without bleeding: Secondary | ICD-10-CM | POA: Diagnosis not present

## 2012-04-01 DIAGNOSIS — Z13820 Encounter for screening for osteoporosis: Secondary | ICD-10-CM | POA: Diagnosis not present

## 2012-04-01 DIAGNOSIS — M810 Age-related osteoporosis without current pathological fracture: Secondary | ICD-10-CM | POA: Diagnosis not present

## 2012-04-02 DIAGNOSIS — M899 Disorder of bone, unspecified: Secondary | ICD-10-CM | POA: Diagnosis not present

## 2012-04-02 DIAGNOSIS — M1710 Unilateral primary osteoarthritis, unspecified knee: Secondary | ICD-10-CM | POA: Diagnosis not present

## 2012-04-02 DIAGNOSIS — M949 Disorder of cartilage, unspecified: Secondary | ICD-10-CM | POA: Diagnosis not present

## 2012-04-03 ENCOUNTER — Ambulatory Visit (HOSPITAL_COMMUNITY)
Admission: RE | Admit: 2012-04-03 | Discharge: 2012-04-03 | Disposition: A | Discharge status: Home / Self Care | Payer: Medicare Other | Source: Ambulatory Visit | Attending: Gastroenterology | Admitting: Gastroenterology

## 2012-04-03 ENCOUNTER — Encounter (HOSPITAL_COMMUNITY)
Admission: RE | Disposition: A | Discharge status: Home / Self Care | Payer: Self-pay | Source: Ambulatory Visit | Attending: Gastroenterology

## 2012-04-03 ENCOUNTER — Encounter (HOSPITAL_COMMUNITY): Payer: Self-pay | Admitting: *Deleted

## 2012-04-03 DIAGNOSIS — K259 Gastric ulcer, unspecified as acute or chronic, without hemorrhage or perforation: Secondary | ICD-10-CM | POA: Insufficient documentation

## 2012-04-03 DIAGNOSIS — Z96659 Presence of unspecified artificial knee joint: Secondary | ICD-10-CM | POA: Insufficient documentation

## 2012-04-03 DIAGNOSIS — K315 Obstruction of duodenum: Secondary | ICD-10-CM | POA: Diagnosis not present

## 2012-04-03 DIAGNOSIS — K219 Gastro-esophageal reflux disease without esophagitis: Secondary | ICD-10-CM | POA: Insufficient documentation

## 2012-04-03 HISTORY — DX: Depression, unspecified: F32.A

## 2012-04-03 HISTORY — DX: Gastro-esophageal reflux disease without esophagitis: K21.9

## 2012-04-03 HISTORY — DX: Headache: R51

## 2012-04-03 HISTORY — DX: Major depressive disorder, single episode, unspecified: F32.9

## 2012-04-03 HISTORY — PX: ESOPHAGOGASTRODUODENOSCOPY: SHX5428

## 2012-04-03 HISTORY — DX: Unspecified osteoarthritis, unspecified site: M19.90

## 2012-04-03 HISTORY — PX: BALLOON DILATION: SHX5330

## 2012-04-03 SURGERY — EGD (ESOPHAGOGASTRODUODENOSCOPY)
Anesthesia: Moderate Sedation

## 2012-04-03 MED ORDER — MIDAZOLAM HCL 10 MG/2ML IJ SOLN
INTRAMUSCULAR | Status: DC | PRN
  Administered 2012-04-03 (×2): 1 mg via INTRAVENOUS
  Administered 2012-04-03 (×3): 2 mg via INTRAVENOUS

## 2012-04-03 MED ORDER — BUTAMBEN-TETRACAINE-BENZOCAINE 2-2-14 % EX AERO
INHALATION_SPRAY | CUTANEOUS | Status: DC | PRN
  Administered 2012-04-03: 2 via TOPICAL

## 2012-04-03 MED ORDER — DIPHENHYDRAMINE HCL 50 MG/ML IJ SOLN
INTRAMUSCULAR | Status: AC
  Filled 2012-04-03: qty 1

## 2012-04-03 MED ORDER — FENTANYL CITRATE 0.05 MG/ML IJ SOLN
INTRAMUSCULAR | Status: AC
  Filled 2012-04-03: qty 2

## 2012-04-03 MED ORDER — DIPHENHYDRAMINE HCL 50 MG/ML IJ SOLN
INTRAMUSCULAR | Status: DC | PRN
  Administered 2012-04-03: 25 mg via INTRAVENOUS

## 2012-04-03 MED ORDER — SODIUM CHLORIDE 0.9 % IV SOLN
Freq: Once | INTRAVENOUS | Status: AC
  Administered 2012-04-03: 08:00:00 via INTRAVENOUS

## 2012-04-03 MED ORDER — FENTANYL CITRATE 0.05 MG/ML IJ SOLN
INTRAMUSCULAR | Status: DC | PRN
  Administered 2012-04-03 (×4): 25 ug via INTRAVENOUS

## 2012-04-03 MED ORDER — MIDAZOLAM HCL 10 MG/2ML IJ SOLN
INTRAMUSCULAR | Status: AC
  Filled 2012-04-03: qty 2

## 2012-04-03 NOTE — Op Note (Signed)
[facility] [address] [place], [place]  [address]  OPERATIVE PROCEDURE REPORT  PATIENT:  Baby, Gieger  MR#:  [id number] BIRTHDATE:  [birth year]  GENDER:  female ENDOSCOPIST:  Jeani Hawking, MD ASSISTANT:  Julieta Gutting PROCEDURE DATE:  04/03/2012 PROCEDURE:  EGD with balloon dilatation ASA CLASS:  Class III INDICATIONS:  Post bulbar stricture MEDICATIONS:  Fentanyl 100 mcg IV, Versed 8 mg IV, Benadryl 25 mg IV TOPICAL ANESTHETIC:  Cetacaine Spray  DESCRIPTION OF PROCEDURE:   After the risks benefits and alternatives of the procedure were thoroughly explained, informed consent was obtained.  The [id number] endoscope was introduced through the mouth and advanced to the third portion of the duodenum, without limitations.  The instrument was slowly withdrawn as the mucosa was fully examined. <<PROCEDUREIMAGES>> FINDINGS:  The shallow antral ulcers were again identified. The pediatric endoscope was able to pass through the post bulbar stricture without any difficulty. The 12-15 mm dilating balloon was inserted into the gastric lumen without difficulty. Using the long, 450 cm Jagwire, the tip of the wire was grasped with biopsy forceps. (The short guidewire was too short to traverse through the entire dilating balloon.) The balloon was then guided into the second portion of the duodenum. The Al Pimple was inserted further into the duodenum to ensure stability of the balloon. Dilation was performed initially at 12 mm, but the balloon was able to move freely. At 13.5 mm the balloon was tighter, but some movement was noted. The balloon at this diameter was held in place for 1 minute. Reexamination of the stricture site was performed after the 13.5 mm dilation and it was felt that she would benefit with the 15 mm dilation. Again, the balloon was held in place for 1 minute. Actual dilation of the stricture was visualized with the larger diameter. Some post dilation bleeding was  noted, which was expected and it appeared that the dilation was successful. Retroflexed views revealed no abnormalities.    The scope was then withdrawn from the patient and the procedure terminated.  COMPLICATIONS:  None  IMPRESSION:  1) Post bulbar duodenal tricture s/p balloon dilation. 2) Antral ulcers. RECOMMENDATIONS:  1) Follow up in the office in one month.  ______________________________ Jeani Hawking, MD  n. Rosalie DoctorJeani Hawking at 04/03/2012 09:35 AM  Josetta Huddle, [phone]

## 2012-04-03 NOTE — H&P (Signed)
  Reason for Consult:Duodenal stenosis Referring Physician: Jeani Hawking, M.D.  Lindsay Frost HPI: The patient underwent an EGD in the office 3 days ago for complaints of abdominal pain.  She was identified to have shallow antral ulcers and a post-bulbar duodenal stenosis.  The adult endoscope was not able to pass through the area and an attempted balloon dilation failed.  Past Medical History  Diagnosis Date  . GERD (gastroesophageal reflux disease)   . Headache     migraines  . Depression   . Arthritis     Past Surgical History  Procedure Date  . Replacement total knee     RT    History reviewed. No pertinent family history.  Social History:  reports that she has never smoked. She has never used smokeless tobacco. She reports that she does not drink alcohol or use illicit drugs.  Allergies: No Known Allergies  Medications:  Scheduled:   . sodium chloride   Intravenous Once   Continuous:   No results found for this or any previous visit (from the past 24 hour(s)).   No results found.  ROS:  As stated above in the HPI otherwise negative.  Blood pressure 143/69, temperature 98 F (36.7 C), temperature source Oral, resp. rate 15, height 5\' 4"  (1.626 m), weight 81.647 kg (180 lb), SpO2 96.00%.    PE: Gen: NAD, Alert and Oriented HEENT:  [place]/AT, EOMI Neck: Supple, no LAD Lungs: CTA Bilaterally CV: RRR without M/G/R ABM: Soft, NTND, +BS Ext: No C/C/E  Assessment/Plan: 1) Duodenal stenosis.  Plan: 1) EGD with balloon dilation.  Kamora Vossler D 04/03/2012, 8:40 AM

## 2012-04-06 ENCOUNTER — Encounter (HOSPITAL_COMMUNITY): Payer: Self-pay | Admitting: Gastroenterology

## 2012-07-22 DIAGNOSIS — Z23 Encounter for immunization: Secondary | ICD-10-CM | POA: Diagnosis not present

## 2012-07-22 DIAGNOSIS — J019 Acute sinusitis, unspecified: Secondary | ICD-10-CM | POA: Diagnosis not present

## 2012-09-24 DIAGNOSIS — R51 Headache: Secondary | ICD-10-CM | POA: Diagnosis not present

## 2012-09-24 DIAGNOSIS — J019 Acute sinusitis, unspecified: Secondary | ICD-10-CM | POA: Diagnosis not present

## 2012-11-10 DIAGNOSIS — M1710 Unilateral primary osteoarthritis, unspecified knee: Secondary | ICD-10-CM | POA: Diagnosis not present

## 2012-11-11 ENCOUNTER — Other Ambulatory Visit: Payer: Self-pay | Admitting: Orthopedic Surgery

## 2012-11-13 ENCOUNTER — Other Ambulatory Visit: Payer: Self-pay | Admitting: Orthopedic Surgery

## 2012-11-24 ENCOUNTER — Encounter (HOSPITAL_COMMUNITY)
Admission: RE | Admit: 2012-11-24 | Discharge: 2012-11-24 | Disposition: A | Discharge status: Home / Self Care | Payer: Medicare Other | Source: Ambulatory Visit | Attending: Orthopedic Surgery | Admitting: Orthopedic Surgery

## 2012-11-24 ENCOUNTER — Encounter (HOSPITAL_COMMUNITY): Payer: Self-pay

## 2012-11-24 DIAGNOSIS — M6281 Muscle weakness (generalized): Secondary | ICD-10-CM | POA: Diagnosis not present

## 2012-11-24 DIAGNOSIS — IMO0002 Reserved for concepts with insufficient information to code with codable children: Secondary | ICD-10-CM | POA: Diagnosis not present

## 2012-11-24 DIAGNOSIS — R279 Unspecified lack of coordination: Secondary | ICD-10-CM | POA: Diagnosis not present

## 2012-11-24 DIAGNOSIS — F3289 Other specified depressive episodes: Secondary | ICD-10-CM | POA: Diagnosis present

## 2012-11-24 DIAGNOSIS — R269 Unspecified abnormalities of gait and mobility: Secondary | ICD-10-CM | POA: Diagnosis not present

## 2012-11-24 DIAGNOSIS — G8918 Other acute postprocedural pain: Secondary | ICD-10-CM | POA: Diagnosis not present

## 2012-11-24 DIAGNOSIS — S8990XA Unspecified injury of unspecified lower leg, initial encounter: Secondary | ICD-10-CM | POA: Diagnosis not present

## 2012-11-24 DIAGNOSIS — Z96659 Presence of unspecified artificial knee joint: Secondary | ICD-10-CM | POA: Diagnosis not present

## 2012-11-24 DIAGNOSIS — Z01818 Encounter for other preprocedural examination: Secondary | ICD-10-CM | POA: Diagnosis not present

## 2012-11-24 DIAGNOSIS — R05 Cough: Secondary | ICD-10-CM | POA: Diagnosis not present

## 2012-11-24 DIAGNOSIS — R059 Cough, unspecified: Secondary | ICD-10-CM | POA: Diagnosis not present

## 2012-11-24 DIAGNOSIS — Z01810 Encounter for preprocedural cardiovascular examination: Secondary | ICD-10-CM | POA: Diagnosis not present

## 2012-11-24 DIAGNOSIS — Z471 Aftercare following joint replacement surgery: Secondary | ICD-10-CM | POA: Diagnosis not present

## 2012-11-24 DIAGNOSIS — M1990 Unspecified osteoarthritis, unspecified site: Secondary | ICD-10-CM | POA: Diagnosis not present

## 2012-11-24 DIAGNOSIS — F329 Major depressive disorder, single episode, unspecified: Secondary | ICD-10-CM | POA: Diagnosis present

## 2012-11-24 DIAGNOSIS — Z01812 Encounter for preprocedural laboratory examination: Secondary | ICD-10-CM | POA: Diagnosis not present

## 2012-11-24 DIAGNOSIS — K219 Gastro-esophageal reflux disease without esophagitis: Secondary | ICD-10-CM | POA: Diagnosis not present

## 2012-11-24 DIAGNOSIS — G43909 Migraine, unspecified, not intractable, without status migrainosus: Secondary | ICD-10-CM | POA: Diagnosis present

## 2012-11-24 DIAGNOSIS — D62 Acute posthemorrhagic anemia: Secondary | ICD-10-CM | POA: Diagnosis not present

## 2012-11-24 DIAGNOSIS — M25569 Pain in unspecified knee: Secondary | ICD-10-CM | POA: Diagnosis not present

## 2012-11-24 DIAGNOSIS — M1710 Unilateral primary osteoarthritis, unspecified knee: Secondary | ICD-10-CM | POA: Diagnosis not present

## 2012-11-24 HISTORY — DX: Personal history of peptic ulcer disease: Z87.11

## 2012-11-24 HISTORY — DX: Effusion, unspecified joint: M25.40

## 2012-11-24 HISTORY — DX: Personal history of other diseases of the nervous system and sense organs: Z86.69

## 2012-11-24 HISTORY — DX: Pain in unspecified joint: M25.50

## 2012-11-24 HISTORY — DX: Personal history of other diseases of the digestive system: Z87.19

## 2012-11-24 HISTORY — DX: Dorsalgia, unspecified: M54.9

## 2012-11-24 LAB — CBC WITH DIFFERENTIAL/PLATELET
Band Neutrophils: 0 % (ref 0–10)
Basophils Absolute: 0.1 10*3/uL (ref 0.0–0.1)
Basophils Relative: 2 % — ABNORMAL HIGH (ref 0–1)
Blasts: 0 %
Eosinophils Absolute: 0 10*3/uL (ref 0.0–0.7)
Eosinophils Relative: 0 % (ref 0–5)
HCT: 40.4 % (ref 36.0–46.0)
Hemoglobin: 13.1 g/dL (ref 12.0–15.0)
Lymphocytes Relative: 19 % (ref 12–46)
Lymphs Abs: 0.8 10*3/uL (ref 0.7–4.0)
MCH: 29.4 pg (ref 26.0–34.0)
MCHC: 32.4 g/dL (ref 30.0–36.0)
MCV: 90.8 fL (ref 78.0–100.0)
Metamyelocytes Relative: 0 %
Monocytes Absolute: 0.2 10*3/uL (ref 0.1–1.0)
Monocytes Relative: 4 % (ref 3–12)
Myelocytes: 0 %
Neutro Abs: 3 10*3/uL (ref 1.7–7.7)
Neutrophils Relative %: 75 % (ref 43–77)
Platelets: 314 10*3/uL (ref 150–400)
Promyelocytes Absolute: 0 %
RBC: 4.45 MIL/uL (ref 3.87–5.11)
RDW: 14 % (ref 11.5–15.5)
WBC: 4.1 10*3/uL (ref 4.0–10.5)
nRBC: 0 /100 WBC

## 2012-11-24 LAB — APTT: aPTT: 31 seconds (ref 24–37)

## 2012-11-24 LAB — URINALYSIS, ROUTINE W REFLEX MICROSCOPIC
Bilirubin Urine: NEGATIVE
Glucose, UA: NEGATIVE mg/dL
Hgb urine dipstick: NEGATIVE
Ketones, ur: NEGATIVE mg/dL
Leukocytes, UA: NEGATIVE
Nitrite: NEGATIVE
Protein, ur: NEGATIVE mg/dL
Specific Gravity, Urine: 1.028 (ref 1.005–1.030)
Urobilinogen, UA: 0.2 mg/dL (ref 0.0–1.0)
pH: 5 (ref 5.0–8.0)

## 2012-11-24 LAB — BASIC METABOLIC PANEL
BUN: 14 mg/dL (ref 6–23)
CO2: 25 mEq/L (ref 19–32)
Calcium: 8.6 mg/dL (ref 8.4–10.5)
Chloride: 106 mEq/L (ref 96–112)
Creatinine, Ser: 0.68 mg/dL (ref 0.50–1.10)
GFR calc Af Amer: >90 mL/min (ref >90)
GFR calc non Af Amer: >90 mL/min (ref >90)
Glucose, Bld: 74 mg/dL (ref 70–99)
Potassium: 3.4 mEq/L — ABNORMAL LOW (ref 3.5–5.1)
Sodium: 142 mEq/L (ref 135–145)

## 2012-11-24 LAB — SURGICAL PCR SCREEN
MRSA, PCR: NEGATIVE
Staphylococcus aureus: NEGATIVE

## 2012-11-24 LAB — PROTIME-INR
INR: 0.98 (ref 0.00–1.49)
Prothrombin Time: 12.9 seconds (ref 11.6–15.2)

## 2012-11-24 LAB — TYPE AND SCREEN
ABO/RH(D): O POS
Antibody Screen: NEGATIVE

## 2012-11-24 MED ORDER — CHLORHEXIDINE GLUCONATE 4 % EX LIQD: 60.0000 mL | Freq: Once | CUTANEOUS | Status: DC

## 2012-11-24 NOTE — Progress Notes (Signed)
Pt saw Dr.Berry at [facility] about 69yrs ago was referred by a neurologist d/t migraines and dizziness  Stress test and echo to be requested from Dr.Berry  Denies Heart cath  Dr.Tanya Daphine Deutscher is Medical Md with [facility]  Denies EKG or CXR being done within past yr

## 2012-11-24 NOTE — Pre-Procedure Instructions (Signed)
Lindsay Frost  11/24/2012   Your procedure is scheduled on: Mon, Mar 17 @ 7:30 AM  Report to [facility] at 5:30 AM.  Call this number if you have problems the morning of surgery: [phone]   Remember:   Do not eat food or drink liquids after midnight.   Take these medicines the morning of surgery with A SIP OF WATER: Cymbalta(Duloxetine) and Omeprazole(Prilosec)   Do not wear jewelry, make-up or nail polish.  Do not wear lotions, powders, or perfumes. You may wear deodorant.  Do not shave 48 hours prior to surgery.   Do not bring valuables to the hospital.  Contacts, dentures or bridgework may not be worn into surgery.  Leave suitcase in the car. After surgery it may be brought to your room.  For patients admitted to the hospital, checkout time is 11:00 AM the day of  discharge.   Patients discharged the day of surgery will not be allowed to drive  home.    Special Instructions: Shower using CHG 2 nights before surgery and the night before surgery.  If you shower the day of surgery use CHG.  Use special wash - you have one bottle of CHG for all showers.  You should use approximately 1/3 of the bottle for each shower.   Please read over the following fact sheets that you were given: Pain Booklet, Coughing and Deep Breathing, Blood Transfusion Information, Total Joint Packet, MRSA Information and Surgical Site Infection Prevention

## 2012-11-25 NOTE — H&P (Signed)
Lindsay Frost is an 57 y.o. female.   Chief Complaint: Left Knee pain HPI: Patient is here to discuss left total knee arthroplasty.  She has failed conservative treatment with observation, anti-inflammatory medicine, intra-articular injection of cortisone and viscosupplementation.  Her right total knee that was done September 22, 2006 continues to perform well.  Her most recent x-rays of the left knee done in July of 2013 did show bone-on-bone arthritic changes, peripheral osteophytes, subchondral sclerosis, and mild lateral subluxation of the tibia beneath the femur.  Past Medical History  Diagnosis Date  . Headache     migraines  . Arthritis   . History of migraine     last one about 2wks ago-takes Imitrex prn  . Joint pain   . Joint swelling   . Back pain     arthritis  . GERD (gastroesophageal reflux disease)     takes Omeprazole daily  . History of gastric ulcer   . Depression     takes cymbalta daily    Past Surgical History  Procedure Laterality Date  . Replacement total knee Right 27yrs ago  . Esophagogastroduodenoscopy  04/03/2012    Procedure: ESOPHAGOGASTRODUODENOSCOPY (EGD);  Surgeon: Theda Belfast, MD;  Location: [facility];  Service: Endoscopy;  Laterality: N/A;  EGD w/ balloon dilation  . Balloon dilation  04/03/2012    Procedure: BALLOON DILATION;  Surgeon: Theda Belfast, MD;  Location: [facility];  Service: Endoscopy;  Laterality: N/A;  . Diagnostic laparoscopy  47yrs ago  . Colonoscopy      No family history on file. Social History:  reports that she has never smoked. She has never used smokeless tobacco. She reports that she does not drink alcohol or use illicit drugs.  Allergies: No Known Allergies  No prescriptions prior to admission    Results for orders placed during the hospital encounter of 11/24/12 (from the past 48 hour(s))  APTT     Status: None   Collection Time    11/24/12 11:32 AM      Result Value Range   aPTT 31  24 - 37 seconds  BASIC  METABOLIC PANEL     Status: Abnormal   Collection Time    11/24/12 11:32 AM      Result Value Range   Sodium 142  135 - 145 mEq/L   Potassium 3.4 (*) 3.5 - 5.1 mEq/L   Chloride 106  96 - 112 mEq/L   CO2 25  19 - 32 mEq/L   Glucose, Bld 74  70 - 99 mg/dL   BUN 14  6 - 23 mg/dL   Creatinine, Ser 1.61  0.50 - 1.10 mg/dL   Calcium 8.6  8.4 - 09.6 mg/dL   GFR calc non Af Amer >90  >90 mL/min   GFR calc Af Amer >90  >90 mL/min   Comment:            The eGFR has been calculated     using the CKD EPI equation.     This calculation has not been     validated in all clinical     situations.     eGFR's persistently     <90 mL/min signify     possible Chronic Kidney Disease.  CBC WITH DIFFERENTIAL     Status: Abnormal   Collection Time    11/24/12 11:32 AM      Result Value Range   WBC 4.1  4.0 - 10.5 K/uL   RBC 4.45  3.87 -  5.11 MIL/uL   Hemoglobin 13.1  12.0 - 15.0 g/dL   HCT 87.5  64.3 - 32.9 %   MCV 90.8  78.0 - 100.0 fL   MCH 29.4  26.0 - 34.0 pg   MCHC 32.4  30.0 - 36.0 g/dL   RDW 51.8  84.1 - 66.0 %   Platelets 314  150 - 400 K/uL   Neutrophils Relative 75  43 - 77 %   Lymphocytes Relative 19  12 - 46 %   Monocytes Relative 4  3 - 12 %   Eosinophils Relative 0  0 - 5 %   Basophils Relative 2 (*) 0 - 1 %   Band Neutrophils 0  0 - 10 %   Metamyelocytes Relative 0     Myelocytes 0     Promyelocytes Absolute 0     Blasts 0     nRBC 0  0 /100 WBC   Neutro Abs 3.0  1.7 - 7.7 K/uL   Lymphs Abs 0.8  0.7 - 4.0 K/uL   Monocytes Absolute 0.2  0.1 - 1.0 K/uL   Eosinophils Absolute 0.0  0.0 - 0.7 K/uL   Basophils Absolute 0.1  0.0 - 0.1 K/uL   RBC Morphology ELLIPTOCYTES    PROTIME-INR     Status: None   Collection Time    11/24/12 11:32 AM      Result Value Range   Prothrombin Time 12.9  11.6 - 15.2 seconds   INR 0.98  0.00 - 1.49  TYPE AND SCREEN     Status: None   Collection Time    11/24/12 11:32 AM      Result Value Range   ABO/RH(D) O POS     Antibody Screen NEG      Sample Expiration 12/08/2012    URINALYSIS, ROUTINE W REFLEX MICROSCOPIC     Status: None   Collection Time    11/24/12 11:32 AM      Result Value Range   Color, Urine YELLOW  YELLOW   APPearance CLEAR  CLEAR   Specific Gravity, Urine 1.028  1.005 - 1.030   pH 5.0  5.0 - 8.0   Glucose, UA NEGATIVE  NEGATIVE mg/dL   Hgb urine dipstick NEGATIVE  NEGATIVE   Bilirubin Urine NEGATIVE  NEGATIVE   Ketones, ur NEGATIVE  NEGATIVE mg/dL   Protein, ur NEGATIVE  NEGATIVE mg/dL   Urobilinogen, UA 0.2  0.0 - 1.0 mg/dL   Nitrite NEGATIVE  NEGATIVE   Leukocytes, UA NEGATIVE  NEGATIVE   Comment: MICROSCOPIC NOT DONE ON URINES WITH NEGATIVE PROTEIN, BLOOD, LEUKOCYTES, NITRITE, OR GLUCOSE <1000 mg/dL.  SURGICAL PCR SCREEN     Status: None   Collection Time    11/24/12 11:32 AM      Result Value Range   MRSA, PCR NEGATIVE  NEGATIVE   Staphylococcus aureus NEGATIVE  NEGATIVE   Comment:            The Xpert SA Assay (FDA     approved for NASAL specimens     in patients over 40 years of age),     is one component of     a comprehensive surveillance     program.  Test performance has     been validated by [employer] for patients greater     than or equal to 53 year old.     It is not intended     to diagnose infection nor to  guide or monitor treatment.   Dg Chest 2 View  11/24/2012  *RADIOLOGY REPORT*  Clinical Data: Cough; preoperative evaluation  CHEST - 2 VIEW  Comparison: February 19, 2007  Findings:  Lungs clear.  Heart size and pulmonary vascularity are normal.  No adenopathy.  No bone lesions.  IMPRESSION: No abnormality noted.   Original Report Authenticated By: Bretta Bang, M.D.     Review of Systems  Constitutional: Negative.   HENT: Negative.   Eyes: Negative.   Respiratory: Negative.   Cardiovascular: Negative.   Gastrointestinal: Negative.   Genitourinary: Negative.   Musculoskeletal: Positive for joint pain.  Skin: Negative.   Neurological: Negative.    Endo/Heme/Allergies: Negative.   Psychiatric/Behavioral: Negative.     There were no vitals taken for this visit. Physical Exam  Constitutional: She is oriented to person, place, and time. She appears well-developed and well-nourished.  HENT:  Head: Normocephalic.  Eyes: Pupils are equal, round, and reactive to light.  Cardiovascular: Normal heart sounds.   Respiratory: Breath sounds normal.  GI: Bowel sounds are normal.  Musculoskeletal:       Left knee: She exhibits decreased range of motion and effusion.  Neurological: She is alert and oriented to person, place, and time.  Psychiatric: Her behavior is normal.     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  Plan: Risks and benefits of knee replacement were discussed and reinforced at length with the patient.  We will get her set up for left total knee arthroplasty at her convenience using DePuy Sigma RP components.  WILLIAMS,JENNIFER M. 11/25/2012, 7:54 PM

## 2012-11-29 MED ORDER — CEFAZOLIN SODIUM-DEXTROSE 2-3 GM-% IV SOLR
2.0000 g | INTRAVENOUS | Status: AC
  Administered 2012-11-30: 2 g via INTRAVENOUS
  Filled 2012-11-29: qty 50

## 2012-11-30 ENCOUNTER — Encounter (HOSPITAL_COMMUNITY): Payer: Self-pay | Admitting: Vascular Surgery

## 2012-11-30 ENCOUNTER — Inpatient Hospital Stay (HOSPITAL_COMMUNITY)
Admission: RE | Admit: 2012-11-30 | Discharge: 2012-12-03 | DRG: 470 | Disposition: A | Discharge status: Skilled Nursing Facility | Payer: Medicare Other | Source: Ambulatory Visit | Attending: Orthopedic Surgery | Admitting: Orthopedic Surgery

## 2012-11-30 ENCOUNTER — Encounter (HOSPITAL_COMMUNITY): Payer: Self-pay | Admitting: *Deleted

## 2012-11-30 ENCOUNTER — Inpatient Hospital Stay (HOSPITAL_COMMUNITY): Payer: Medicare Other | Admitting: Anesthesiology

## 2012-11-30 ENCOUNTER — Encounter (HOSPITAL_COMMUNITY)
Admission: RE | Disposition: A | Discharge status: Skilled Nursing Facility | Payer: Self-pay | Source: Ambulatory Visit | Attending: Orthopedic Surgery

## 2012-11-30 DIAGNOSIS — Z96659 Presence of unspecified artificial knee joint: Secondary | ICD-10-CM | POA: Diagnosis not present

## 2012-11-30 DIAGNOSIS — Z01810 Encounter for preprocedural cardiovascular examination: Secondary | ICD-10-CM

## 2012-11-30 DIAGNOSIS — F329 Major depressive disorder, single episode, unspecified: Secondary | ICD-10-CM | POA: Diagnosis present

## 2012-11-30 DIAGNOSIS — M1712 Unilateral primary osteoarthritis, left knee: Secondary | ICD-10-CM

## 2012-11-30 DIAGNOSIS — G8918 Other acute postprocedural pain: Secondary | ICD-10-CM | POA: Diagnosis not present

## 2012-11-30 DIAGNOSIS — M1710 Unilateral primary osteoarthritis, unspecified knee: Secondary | ICD-10-CM | POA: Diagnosis not present

## 2012-11-30 DIAGNOSIS — D62 Acute posthemorrhagic anemia: Secondary | ICD-10-CM | POA: Diagnosis not present

## 2012-11-30 DIAGNOSIS — K219 Gastro-esophageal reflux disease without esophagitis: Secondary | ICD-10-CM | POA: Diagnosis not present

## 2012-11-30 DIAGNOSIS — G43909 Migraine, unspecified, not intractable, without status migrainosus: Secondary | ICD-10-CM | POA: Diagnosis present

## 2012-11-30 DIAGNOSIS — Z01818 Encounter for other preprocedural examination: Secondary | ICD-10-CM

## 2012-11-30 DIAGNOSIS — Z01812 Encounter for preprocedural laboratory examination: Secondary | ICD-10-CM

## 2012-11-30 DIAGNOSIS — F3289 Other specified depressive episodes: Secondary | ICD-10-CM | POA: Diagnosis present

## 2012-11-30 HISTORY — PX: TOTAL KNEE ARTHROPLASTY: SHX125

## 2012-11-30 SURGERY — ARTHROPLASTY, KNEE, TOTAL
Anesthesia: Regional | Site: Knee | Laterality: Left | Wound class: Clean

## 2012-11-30 MED ORDER — HYDROMORPHONE HCL PF 1 MG/ML IJ SOLN
0.2500 mg | INTRAMUSCULAR | Status: DC | PRN
  Administered 2012-11-30 (×6): 0.5 mg via INTRAVENOUS

## 2012-11-30 MED ORDER — METHOCARBAMOL 100 MG/ML IJ SOLN: 500.0000 mg | Freq: Four times a day (QID) | INTRAVENOUS | Status: DC | PRN

## 2012-11-30 MED ORDER — LIDOCAINE HCL (CARDIAC) 20 MG/ML IV SOLN
INTRAVENOUS | Status: DC | PRN
  Administered 2012-11-30: 100 mg via INTRAVENOUS

## 2012-11-30 MED ORDER — CEFUROXIME SODIUM 1.5 G IJ SOLR
INTRAMUSCULAR | Status: AC
  Filled 2012-11-30: qty 1.5

## 2012-11-30 MED ORDER — OXYCODONE HCL 5 MG PO TABS
5.0000 mg | ORAL_TABLET | ORAL | Status: DC | PRN
  Administered 2012-11-30 – 2012-12-03 (×17): 10 mg via ORAL
  Filled 2012-11-30 (×17): qty 2

## 2012-11-30 MED ORDER — METOCLOPRAMIDE HCL 5 MG PO TABS: 5.0000 mg | ORAL_TABLET | Freq: Three times a day (TID) | ORAL | Status: DC | PRN

## 2012-11-30 MED ORDER — FLEET ENEMA 7-19 GM/118ML RE ENEM: 1.0000 | ENEMA | Freq: Once | RECTAL | Status: AC | PRN

## 2012-11-30 MED ORDER — ONDANSETRON HCL 4 MG/2ML IJ SOLN: 4.0000 mg | Freq: Four times a day (QID) | INTRAMUSCULAR | Status: DC | PRN

## 2012-11-30 MED ORDER — DULOXETINE HCL 60 MG PO CPEP
60.0000 mg | ORAL_CAPSULE | Freq: Two times a day (BID) | ORAL | Status: DC
  Administered 2012-11-30 – 2012-12-03 (×6): 60 mg via ORAL
  Filled 2012-11-30 (×8): qty 1

## 2012-11-30 MED ORDER — FENTANYL CITRATE 0.05 MG/ML IJ SOLN
INTRAMUSCULAR | Status: DC | PRN
  Administered 2012-11-30 (×3): 50 ug via INTRAVENOUS
  Administered 2012-11-30: 100 ug via INTRAVENOUS

## 2012-11-30 MED ORDER — ONDANSETRON HCL 4 MG PO TABS
4.0000 mg | ORAL_TABLET | Freq: Four times a day (QID) | ORAL | Status: DC | PRN
  Administered 2012-12-02: 4 mg via ORAL
  Filled 2012-11-30: qty 1

## 2012-11-30 MED ORDER — OXYCODONE HCL 5 MG/5ML PO SOLN: 5.0000 mg | Freq: Once | ORAL | Status: DC | PRN

## 2012-11-30 MED ORDER — ACETAMINOPHEN 10 MG/ML IV SOLN
1000.0000 mg | Freq: Four times a day (QID) | INTRAVENOUS | Status: AC
  Administered 2012-11-30 – 2012-12-01 (×4): 1000 mg via INTRAVENOUS
  Filled 2012-11-30 (×4): qty 100

## 2012-11-30 MED ORDER — METOCLOPRAMIDE HCL 5 MG/ML IJ SOLN
INTRAMUSCULAR | Status: AC
  Filled 2012-11-30: qty 2

## 2012-11-30 MED ORDER — HYDROMORPHONE HCL PF 1 MG/ML IJ SOLN
INTRAMUSCULAR | Status: AC
  Filled 2012-11-30: qty 1

## 2012-11-30 MED ORDER — BUPIVACAINE-EPINEPHRINE PF 0.5-1:200000 % IJ SOLN
INTRAMUSCULAR | Status: DC | PRN
  Administered 2012-11-30: 30 mL

## 2012-11-30 MED ORDER — DEXAMETHASONE SODIUM PHOSPHATE 10 MG/ML IJ SOLN
INTRAMUSCULAR | Status: DC | PRN
  Administered 2012-11-30: 8 mg via INTRAVENOUS

## 2012-11-30 MED ORDER — PANTOPRAZOLE SODIUM 40 MG PO TBEC
40.0000 mg | DELAYED_RELEASE_TABLET | Freq: Every day | ORAL | Status: DC
  Administered 2012-11-30 – 2012-12-03 (×4): 40 mg via ORAL
  Filled 2012-11-30 (×4): qty 1

## 2012-11-30 MED ORDER — CELECOXIB 200 MG PO CAPS
200.0000 mg | ORAL_CAPSULE | Freq: Two times a day (BID) | ORAL | Status: DC
  Administered 2012-11-30 – 2012-12-03 (×7): 200 mg via ORAL
  Filled 2012-11-30 (×8): qty 1

## 2012-11-30 MED ORDER — DEXTROSE-NACL 5-0.45 % IV SOLN: INTRAVENOUS | Status: DC

## 2012-11-30 MED ORDER — TEMAZEPAM 15 MG PO CAPS: 15.0000 mg | ORAL_CAPSULE | Freq: Every evening | ORAL | Status: DC | PRN

## 2012-11-30 MED ORDER — OXYCODONE HCL 5 MG PO TABS
ORAL_TABLET | ORAL | Status: AC
  Filled 2012-11-30: qty 2

## 2012-11-30 MED ORDER — SUMATRIPTAN SUCCINATE 50 MG PO TABS: 50.0000 mg | ORAL_TABLET | ORAL | Status: DC | PRN

## 2012-11-30 MED ORDER — PHENOL 1.4 % MT LIQD: 1.0000 | OROMUCOSAL | Status: DC | PRN

## 2012-11-30 MED ORDER — BISACODYL 5 MG PO TBEC: 5.0000 mg | DELAYED_RELEASE_TABLET | Freq: Every day | ORAL | Status: DC | PRN

## 2012-11-30 MED ORDER — SODIUM CHLORIDE 0.9 % IR SOLN
Status: DC | PRN
  Administered 2012-11-30: 1000 mL
  Administered 2012-11-30: 3000 mL

## 2012-11-30 MED ORDER — METHOCARBAMOL 500 MG PO TABS
ORAL_TABLET | ORAL | Status: AC
  Filled 2012-11-30: qty 1

## 2012-11-30 MED ORDER — KCL IN DEXTROSE-NACL 20-5-0.45 MEQ/L-%-% IV SOLN
INTRAVENOUS | Status: DC
  Administered 2012-11-30: 12:00:00 via INTRAVENOUS
  Filled 2012-11-30 (×12): qty 1000

## 2012-11-30 MED ORDER — ACETAMINOPHEN 650 MG RE SUPP: 650.0000 mg | Freq: Four times a day (QID) | RECTAL | Status: DC | PRN

## 2012-11-30 MED ORDER — MAGNESIUM HYDROXIDE 400 MG/5ML PO SUSP
30.0000 mL | Freq: Every day | ORAL | Status: DC | PRN
  Administered 2012-12-01: 30 mL via ORAL
  Filled 2012-11-30: qty 30

## 2012-11-30 MED ORDER — OXYCODONE HCL 5 MG PO TABS: 5.0000 mg | ORAL_TABLET | Freq: Once | ORAL | Status: DC | PRN

## 2012-11-30 MED ORDER — DIPHENHYDRAMINE HCL 12.5 MG/5ML PO ELIX: 12.5000 mg | ORAL_SOLUTION | ORAL | Status: DC | PRN

## 2012-11-30 MED ORDER — HYDROMORPHONE HCL PF 1 MG/ML IJ SOLN
1.0000 mg | INTRAMUSCULAR | Status: DC | PRN
  Administered 2012-11-30 – 2012-12-01 (×4): 1 mg via INTRAVENOUS
  Filled 2012-11-30 (×5): qty 1

## 2012-11-30 MED ORDER — CEFUROXIME SODIUM 1.5 G IJ SOLR
INTRAMUSCULAR | Status: DC | PRN
  Administered 2012-11-30: 1.5 g

## 2012-11-30 MED ORDER — METHOCARBAMOL 500 MG PO TABS
500.0000 mg | ORAL_TABLET | Freq: Four times a day (QID) | ORAL | Status: DC | PRN
  Administered 2012-11-30 – 2012-12-03 (×10): 500 mg via ORAL
  Filled 2012-11-30 (×9): qty 1

## 2012-11-30 MED ORDER — ACETAMINOPHEN 325 MG PO TABS
650.0000 mg | ORAL_TABLET | Freq: Four times a day (QID) | ORAL | Status: DC | PRN
  Administered 2012-12-01 – 2012-12-02 (×3): 650 mg via ORAL
  Filled 2012-11-30 (×3): qty 2

## 2012-11-30 MED ORDER — ONDANSETRON HCL 4 MG/2ML IJ SOLN
INTRAMUSCULAR | Status: DC | PRN
  Administered 2012-11-30: 4 mg via INTRAVENOUS

## 2012-11-30 MED ORDER — PROPOFOL 10 MG/ML IV EMUL
INTRAVENOUS | Status: DC | PRN
  Administered 2012-11-30: 180 mL via INTRAVENOUS

## 2012-11-30 MED ORDER — LACTATED RINGERS IV SOLN
INTRAVENOUS | Status: DC | PRN
  Administered 2012-11-30 (×2): via INTRAVENOUS

## 2012-11-30 MED ORDER — METOCLOPRAMIDE HCL 5 MG/ML IJ SOLN
5.0000 mg | Freq: Three times a day (TID) | INTRAMUSCULAR | Status: DC | PRN
  Administered 2012-11-30: 10 mg via INTRAVENOUS

## 2012-11-30 MED ORDER — MENTHOL 3 MG MT LOZG: 1.0000 | LOZENGE | OROMUCOSAL | Status: DC | PRN

## 2012-11-30 MED ORDER — ASPIRIN EC 325 MG PO TBEC
325.0000 mg | DELAYED_RELEASE_TABLET | Freq: Two times a day (BID) | ORAL | Status: DC
Start: 2012-11-30 — End: 2012-12-03
  Administered 2012-11-30 – 2012-12-03 (×7): 325 mg via ORAL
  Filled 2012-11-30 (×8): qty 1

## 2012-11-30 MED ORDER — ALUM & MAG HYDROXIDE-SIMETH 200-200-20 MG/5ML PO SUSP: 30.0000 mL | ORAL | Status: DC | PRN

## 2012-11-30 MED ORDER — MIDAZOLAM HCL 5 MG/5ML IJ SOLN
INTRAMUSCULAR | Status: DC | PRN
  Administered 2012-11-30: 2 mg via INTRAVENOUS

## 2012-11-30 SURGICAL SUPPLY — 54 items
BANDAGE ESMARK 6X9 LF (GAUZE/BANDAGES/DRESSINGS) ×1 IMPLANT
BLADE SAG 18X100X1.27 (BLADE) ×2 IMPLANT
BLADE SAW SGTL 13X75X1.27 (BLADE) ×2 IMPLANT
BLADE SURG ROTATE 9660 (MISCELLANEOUS) IMPLANT
BNDG ELASTIC 6X10 VLCR STRL LF (GAUZE/BANDAGES/DRESSINGS) ×2 IMPLANT
BNDG ESMARK 6X9 LF (GAUZE/BANDAGES/DRESSINGS) ×2
BOWL SMART MIX CTS (DISPOSABLE) ×2 IMPLANT
CEMENT HV SMART SET (Cement) ×4 IMPLANT
CLOTH BEACON ORANGE TIMEOUT ST (SAFETY) ×2 IMPLANT
COVER SURGICAL LIGHT HANDLE (MISCELLANEOUS) ×2 IMPLANT
CUFF TOURNIQUET SINGLE 34IN LL (TOURNIQUET CUFF) ×2 IMPLANT
CUFF TOURNIQUET SINGLE 44IN (TOURNIQUET CUFF) IMPLANT
DRAPE EXTREMITY T 121X128X90 (DRAPE) ×2 IMPLANT
DRAPE U-SHAPE 47X51 STRL (DRAPES) ×2 IMPLANT
DURAPREP 26ML APPLICATOR (WOUND CARE) ×4 IMPLANT
ELECT REM PT RETURN 9FT ADLT (ELECTROSURGICAL) ×2
ELECTRODE REM PT RTRN 9FT ADLT (ELECTROSURGICAL) ×1 IMPLANT
EVACUATOR 1/8 PVC DRAIN (DRAIN) ×2 IMPLANT
GAUZE XEROFORM 1X8 LF (GAUZE/BANDAGES/DRESSINGS) ×2 IMPLANT
GLOVE BIO SURGEON STRL SZ7 (GLOVE) ×2 IMPLANT
GLOVE BIO SURGEON STRL SZ7.5 (GLOVE) ×4 IMPLANT
GLOVE BIOGEL PI IND STRL 7.0 (GLOVE) ×2 IMPLANT
GLOVE BIOGEL PI IND STRL 8 (GLOVE) ×1 IMPLANT
GLOVE BIOGEL PI INDICATOR 7.0 (GLOVE) ×2
GLOVE BIOGEL PI INDICATOR 8 (GLOVE) ×1
GLOVE BIOGEL PI ORTHO PRO 7.5 (GLOVE) ×1
GLOVE PI ORTHO PRO STRL 7.5 (GLOVE) ×1 IMPLANT
GLOVE SURG SS PI 7.5 STRL IVOR (GLOVE) ×6 IMPLANT
GOWN PREVENTION PLUS XLARGE (GOWN DISPOSABLE) ×2 IMPLANT
GOWN STRL NON-REIN LRG LVL3 (GOWN DISPOSABLE) ×2 IMPLANT
GOWN STRL REIN XL XLG (GOWN DISPOSABLE) ×2 IMPLANT
HANDPIECE INTERPULSE COAX TIP (DISPOSABLE) ×1
HOOD PEEL AWAY FACE SHEILD DIS (HOOD) ×4 IMPLANT
KIT BASIN OR (CUSTOM PROCEDURE TRAY) ×2 IMPLANT
KIT ROOM TURNOVER OR (KITS) ×2 IMPLANT
MANIFOLD NEPTUNE II (INSTRUMENTS) ×2 IMPLANT
NS IRRIG 1000ML POUR BTL (IV SOLUTION) ×2 IMPLANT
PACK TOTAL JOINT (CUSTOM PROCEDURE TRAY) ×2 IMPLANT
PAD ARMBOARD 7.5X6 YLW CONV (MISCELLANEOUS) ×4 IMPLANT
PADDING CAST COTTON 6X4 STRL (CAST SUPPLIES) ×2 IMPLANT
SET HNDPC FAN SPRY TIP SCT (DISPOSABLE) ×1 IMPLANT
SPONGE GAUZE 4X4 12PLY (GAUZE/BANDAGES/DRESSINGS) ×4 IMPLANT
STAPLER VISISTAT 35W (STAPLE) ×2 IMPLANT
SUCTION FRAZIER TIP 10 FR DISP (SUCTIONS) ×2 IMPLANT
SUT VIC AB 0 CTX 36 (SUTURE) ×1
SUT VIC AB 0 CTX36XBRD ANTBCTR (SUTURE) ×1 IMPLANT
SUT VIC AB 1 CTX 36 (SUTURE) ×1
SUT VIC AB 1 CTX36XBRD ANBCTR (SUTURE) ×1 IMPLANT
SUT VIC AB 2-0 CT1 27 (SUTURE) ×1
SUT VIC AB 2-0 CT1 TAPERPNT 27 (SUTURE) ×1 IMPLANT
TOWEL OR 17X24 6PK STRL BLUE (TOWEL DISPOSABLE) ×2 IMPLANT
TOWEL OR 17X26 10 PK STRL BLUE (TOWEL DISPOSABLE) ×2 IMPLANT
TRAY FOLEY CATH 14FR (SET/KITS/TRAYS/PACK) ×2 IMPLANT
WATER STERILE IRR 1000ML POUR (IV SOLUTION) ×4 IMPLANT

## 2012-11-30 NOTE — Transfer of Care (Signed)
Immediate Anesthesia Transfer of Care Note  Patient: Lindsay Frost  Procedure(s) Performed: Procedure(s): LEFT TOTAL KNEE ARTHROPLASTY (Left)  Patient Location: PACU  Anesthesia Type:General  Level of Consciousness: awake, alert , oriented and patient cooperative  Airway & Oxygen Therapy: Patient Spontanous Breathing and Patient connected to nasal cannula oxygen  Post-op Assessment: Report given to PACU RN and Post -op Vital signs reviewed and stable  Post vital signs: Reviewed and stable  Complications: No apparent anesthesia complications

## 2012-11-30 NOTE — Anesthesia Procedure Notes (Addendum)
Anesthesia Regional Block:  Femoral nerve block  Pre-Anesthetic Checklist: ,, timeout performed, Correct Patient, Correct Site, Correct Laterality, Correct Procedure,, site marked, risks and benefits discussed, Surgical consent,  Pre-op evaluation,  At surgeon's request and post-op pain management  Laterality: Left  Prep: chloraprep       Needles:  Injection technique: Single-shot  Needle Type: Other     Needle Length: 5cm 5 cm Needle Gauge: 20 and 20 G    Additional Needles:  Procedures: ultrasound guided (picture in chart) and nerve stimulator Femoral nerve block Narrative:  Start time: 11/30/2012 7:04 AM End time: 11/30/2012 7:23 AM Injection made incrementally with aspirations every 5 mL.  Performed by: Personally   Additional Notes: Monitors applied. Patient sedated. Sterile prep and drape,hand hygiene and sterile gloves were used. Relevant anatomy identified.Needle position confirmed.Local anesthetic injected incrementally after negative aspiration. Local anesthetic spread visualized around nerve(s). Vascular puncture avoided. No complications. Image printed for medical record.The patient tolerated the procedure well.         Femoral nerve block Procedure Name: LMA Insertion Date/Time: 11/30/2012 7:36 AM Performed by: Tyrone Nine Pre-anesthesia Checklist: Patient identified, Timeout performed, Emergency Drugs available, Suction available and Patient being monitored Patient Re-evaluated:Patient Re-evaluated prior to inductionOxygen Delivery Method: Circle system utilized Preoxygenation: Pre-oxygenation with 100% oxygen Intubation Type: IV induction Ventilation: Mask ventilation without difficulty LMA: LMA with gastric port inserted LMA Size: 4.0 Number of attempts: 1 Placement Confirmation: breath sounds checked- equal and bilateral and positive ETCO2 Tube secured with: Tape Dental Injury: Teeth and Oropharynx as per pre-operative assessment

## 2012-11-30 NOTE — Anesthesia Preprocedure Evaluation (Addendum)
Anesthesia Evaluation  Patient identified by MRN, date of birth, ID band Patient awake    Reviewed: Allergy & Precautions, H&P , NPO status , Patient's Chart, lab work & pertinent test results  Airway Mallampati: II  Neck ROM: full    Dental  (+) Teeth Intact and Dental Advisory Given   Pulmonary  11-24-12 Chest X-ray  Findings:  Lungs clear.  Heart size and pulmonary vascularity are normal.  No adenopathy.  No bone lesions.   IMPRESSION: No abnormality noted.        Pulmonary exam normal       Cardiovascular Exercise Tolerance: Good Rhythm:Regular Rate:Normal     Neuro/Psych  Headaches, Depression    GI/Hepatic Neg liver ROS, GERD-  Medicated and Controlled,  Endo/Other  negative endocrine ROSobese  Renal/GU negative Renal ROS  negative genitourinary   Musculoskeletal  (+) Arthritis -,   Abdominal Normal abdominal exam  (+)   Peds  Hematology negative hematology ROS (+)   Anesthesia Other Findings   Reproductive/Obstetrics negative OB ROS                        Anesthesia Physical Anesthesia Plan  ASA: II  Anesthesia Plan: General and Regional   Post-op Pain Management: MAC Combined w/ Regional for Post-op pain   Induction: Intravenous  Airway Management Planned: LMA  Additional Equipment:   Intra-op Plan:   Post-operative Plan: Extubation in OR  Informed Consent: I have reviewed the patients History and Physical, chart, labs and discussed the procedure including the risks, benefits and alternatives for the proposed anesthesia with the patient or authorized representative who has indicated his/her understanding and acceptance.   Dental advisory given  Plan Discussed with: CRNA and Surgeon  Anesthesia Plan Comments:        Anesthesia Quick Evaluation

## 2012-11-30 NOTE — Progress Notes (Signed)
Orthopedic Tech Progress Note Patient Details:  Lindsay Frost [birth year] [id number]  CPM Left Knee CPM Left Knee: On Left Knee Flexion (Degrees): 60 Left Knee Extension (Degrees): 0 Additional Comments: trapeze bar   [name] 11/30/2012, 10:57 AM

## 2012-11-30 NOTE — Op Note (Signed)
PATIENT ID:      Lindsay Frost  MRN:     [id number] DOB/AGE:    02-28-1956 / 57 y.o.       OPERATIVE REPORT    DATE OF PROCEDURE:  11/30/2012       PREOPERATIVE DIAGNOSIS:   OSTEOARTHRITIS LEFT KNEE VARUS      Estimated body mass index is 30.88 kg/(m^2) as calculated from the following:   Height as of 11/24/12: 5\' 4"  (1.626 m).   Weight as of 04/03/12: 81.647 kg (180 lb).                                                        POSTOPERATIVE DIAGNOSIS:   OSTEOARTHRITIS LEFT KNEE VARUS                                                                      PROCEDURE:  Procedure(s): LEFT TOTAL KNEE ARTHROPLASTY Using Depuy Sigma RP implants #3L Femur, #3Tibia, 10mm sigma RP bearing, 32 Patella     SURGEON: Islah Eve J    ASSISTANT:   Shirl Harris PA-C   (Present and scrubbed throughout the case, critical for assistance with exposure, retraction, instrumentation, and closure.)         ANESTHESIA: GET with Femoral Nerve Block  DRAINS: foley, 2 medium hemovac in knee   TOURNIQUET TIME:   COMPLICATIONS:  None     SPECIMENS: None   INDICATIONS FOR PROCEDURE: The patient has  OSTEOARTHRITIS LEFT KNEE VARUS, varus deformities, XR shows bone on bone arthritis. Patient has failed all conservative measures including anti-inflammatory medicines, narcotics, attempts at  exercise and weight loss, cortisone injections and viscosupplementation.  Risks and benefits of surgery have been discussed, questions answered.   DESCRIPTION OF PROCEDURE: The patient identified by armband, received  right femoral nerve block and IV antibiotics, in the holding area at [facility]. Patient taken to the operating room, appropriate anesthetic  monitors were attached General endotracheal anesthesia induced with  the patient in supine position, Foley catheter was inserted. Tourniquet  applied high to the operative thigh. Lateral post and foot positioner  applied to the table, the lower extremity was  then prepped and draped  in usual sterile fashion from the ankle to the tourniquet. Time-out procedure was performed. The limb was wrapped with an Esmarch bandage and the tourniquet inflated to 350 mmHg. We began the operation by making the anterior midline incision starting at handbreadth above the patella going over the patella 1 cm medial to and  4 cm distal to the tibial tubercle. Small bleeders in the skin and the  subcutaneous tissue identified and cauterized. Transverse retinaculum was incised and reflected medially and a medial parapatellar arthrotomy was accomplished. the patella was everted and theprepatellar fat pad resected. The superficial medial collateral  ligament was then elevated from anterior to posterior along the proximal  flare of the tibia and anterior half of the menisci resected. The knee was hyperflexed exposing bone on bone arthritis. Peripheral and notch osteophytes as well as the cruciate ligaments were then resected. We  continued to  work our way around posteriorly along the proximal tibia, and externally  rotated the tibia subluxing it out from underneath the femur. A McHale  retractor was placed through the notch and a lateral Hohmann retractor  placed, and we then drilled through the proximal tibia in line with the  axis of the tibia followed by an intramedullary guide rod and 2-degree  posterior slope cutting guide. The tibial cutting guide was pinned into place  allowing resection of 5 mm of bone medially and about 9 mm of bone  laterally because of her varus deformity. Satisfied with the tibial resection, we then  entered the distal femur 2 mm anterior to the PCL origin with the  intramedullary guide rod and applied the distal femoral cutting guide  set at 11mm, with 5 degrees of valgus. This was pinned along the  epicondylar axis. At this point, the distal femoral cut was accomplished without difficulty. We then sized for a #3R femoral component and pinned the  guide in 3 degrees of external rotation.The chamfer cutting guide was pinned into place. The anterior, posterior, and chamfer cuts were accomplished without difficulty followed by  the Sigma RP box cutting guide and the box cut. We also removed posterior osteophytes from the posterior femoral condyles. At this  time, the knee was brought into full extension. We checked our  extension and flexion gaps and found them symmetric at 10mm.  The patella thickness measured at 22 mm. We set the cutting guide at 13 and removed the posterior 9 mm  of the patella sized for 32 button and drilled the lollipop. The knee  was then once again hyperflexed exposing the proximal tibia. We sized for a #3 tibial base plate, applied the smokestack and the conical reamer followed by the the Delta fin keel punch. We then hammered into place the Sigma RP trial femoral component, inserted a 10-mm trial bearing, trial patellar button, and took the knee through range of motion from 0-130 degrees. No thumb pressure was required for patellar  tracking. At this point, all trial components were removed, a double batch of DePuy HV cement with 1500 mg of Zinacef was mixed and applied to all bony metallic mating surfaces except for the posterior condyles of the femur itself. In order, we  hammered into place the tibial tray and removed excess cement, the femoral component and removed excess cement, a 10-mm Sigma RP bearing  was inserted, and the knee brought to full extension with compression.  The patellar button was clamped into place, and excess cement  removed. While the cement cured the wound was irrigated out with normal saline solution pulse lavage, and medium Hemovac drains were placed from an anterolateral  approach. Ligament stability and patellar tracking were checked and found to be excellent. The parapatellar arthrotomy was closed with  running #1 Vicryl suture. The subcutaneous tissue with 0 and 2-0 undyed  Vicryl suture,  and the skin with skin staples. A dressing of Xeroform,  4 x 4, dressing sponges, Webril, and Ace wrap applied. The patient  awakened, extubated, and taken to recovery room without difficulty.   Krishav Mamone J 11/30/2012, 8:52 AM

## 2012-11-30 NOTE — Anesthesia Postprocedure Evaluation (Signed)
Anesthesia Post Note  Patient: Lindsay Frost  Procedure(s) Performed: Procedure(s) (LRB): LEFT TOTAL KNEE ARTHROPLASTY (Left)  Anesthesia type: General  Patient location: PACU  Post pain: Pain level controlled and Adequate analgesia  Post assessment: Post-op Vital signs reviewed, Patient's Cardiovascular Status Stable, Respiratory Function Stable, Patent Airway and Pain level controlled  Last Vitals:  Filed Vitals:   11/30/12 0900  BP: 143/72  Pulse: 98  Temp: 36.6 C  Resp: 16    Post vital signs: Reviewed and stable  Level of consciousness: awake, alert  and oriented  Complications: No apparent anesthesia complications

## 2012-11-30 NOTE — Evaluation (Signed)
Physical Therapy Evaluation Patient Details Name: Lindsay Frost MRN: [id number] DOB: 01-11-1956 Today's Date: 11/30/2012 Time: 8413-2440 PT Time Calculation (min): 20 min  PT Assessment / Plan / Recommendation Clinical Impression  Patient is a 57 yo female s/p Lt. TKA.  Will benefit from acute PT to maximize independence prior to return home.  Patient lives with daughter who works away from home most of day.  Recommend ST-SNF for continued therapy at discharge.    PT Assessment  Patient needs continued PT services    Follow Up Recommendations  SNF    Does the patient have the potential to tolerate intense rehabilitation      Barriers to Discharge Decreased caregiver support Daughter is away from home most of the day.    Equipment Recommendations  Rolling walker with 5" wheels (3-in-1 BSC)    Recommendations for Other Services     Frequency 7X/week    Precautions / Restrictions Precautions Precautions: Knee Precaution Booklet Issued: Yes (comment) Precaution Comments: Provided education on precautions to patient Restrictions Weight Bearing Restrictions: Yes LLE Weight Bearing: Weight bearing as tolerated   Pertinent Vitals/Pain       Mobility  Bed Mobility Bed Mobility: Supine to Sit;Sitting - Scoot to Edge of Bed Supine to Sit: 4: Min assist;HOB flat Sitting - Scoot to [place] of Bed: 4: Min guard Details for Bed Mobility Assistance: Verbal cues for technique.  Assist to move LLE off of bed. Transfers Transfers: Sit to Stand;Stand to [employer] Transfers Sit to Stand: 4: Min assist;With upper extremity assist;From bed Stand to Sit: 4: Min assist;With upper extremity assist;With armrests;To chair/3-in-1 Stand Pivot Transfers: 4: Min assist Details for Transfer Assistance: Verbal cues for hand placement and technique.  Assist to rise to standing and for balance.  Verbal and tactile cues for safe use of RW and technique for pivot transfer.  Patient able to take  several steps to pivot to chair. Ambulation/Gait Ambulation/Gait Assistance: Not tested (comment)    Exercises Total Joint Exercises Ankle Circles/Pumps: AROM;Both;10 reps;Seated   PT Diagnosis: Difficulty walking;Acute pain  PT Problem List: Decreased strength;Decreased range of motion;Decreased activity tolerance;Decreased mobility;Decreased knowledge of use of DME;Decreased knowledge of precautions;Pain PT Treatment Interventions: DME instruction;Gait training;Functional mobility training;Therapeutic exercise;Patient/family education   PT Goals Acute Rehab PT Goals PT Goal Formulation: With patient Time For Goal Achievement: 12/07/12 Potential to Achieve Goals: Good Pt will go Supine/Side to Sit: with modified independence;with rail;with HOB 0 degrees PT Goal: Supine/Side to Sit - Progress: Goal set today Pt will go Sit to Supine/Side: with modified independence;with HOB 0 degrees;with rail PT Goal: Sit to Supine/Side - Progress: Goal set today Pt will go Sit to Stand: with supervision;with upper extremity assist PT Goal: Sit to Stand - Progress: Goal set today Pt will Ambulate: 51 - 150 feet;with supervision;with rolling walker PT Goal: Ambulate - Progress: Goal set today Pt will Perform Home Exercise Program: with supervision, verbal cues required/provided PT Goal: Perform Home Exercise Program - Progress: Goal set today  Visit Information  Last PT Received On: 11/30/12 Assistance Needed: +1    Subjective Data  Subjective: "I'm going to [facility] for therapy" Patient Stated Goal: To walk.   Prior Functioning  Home Living Lives With: Daughter (Daughter works - away from home most of day) Available Help at Discharge: Skilled Nursing Facility Home Adaptive Equipment: None Prior Function Level of Independence: Independent Able to Take Stairs?: Yes Driving: Yes Vocation: Unemployed Communication Communication: No difficulties    Cognition  Cognition Overall  Cognitive Status: Appears within functional limits for tasks assessed/performed Arousal/Alertness: Awake/alert (Initially somewhat drowzy) Orientation Level: Oriented X4 / Intact Behavior During Session: [facility] for tasks performed    Extremity/Trunk Assessment Right Upper Extremity Assessment RUE ROM/Strength/Tone: WFL for tasks assessed RUE Sensation: WFL - Light Touch Left Upper Extremity Assessment LUE ROM/Strength/Tone: WFL for tasks assessed LUE Sensation: WFL - Light Touch Right Lower Extremity Assessment RLE ROM/Strength/Tone: WFL for tasks assessed RLE Sensation: WFL - Light Touch Left Lower Extremity Assessment LLE ROM/Strength/Tone: Deficits;Unable to fully assess;Due to pain LLE ROM/Strength/Tone Deficits: Able to assist moving LLE off of bed. Trunk Assessment Trunk Assessment: Normal   Balance Balance Balance Assessed: Yes Static Sitting Balance Static Sitting - Balance Support: No upper extremity supported;Feet supported Static Sitting - Level of Assistance: 5: Stand by assistance Static Sitting - Comment/# of Minutes: 6  End of Session PT - End of Session Equipment Utilized During Treatment: Gait belt;Oxygen Activity Tolerance: Patient limited by pain;Patient limited by fatigue Patient left: in chair;with call bell/phone within reach;with family/visitor present Nurse Communication: Mobility status;Patient requests pain meds CPM Left Knee CPM Left Knee: Off  GP     Vena Austria 11/30/2012, 5:27 PM Durenda Hurt. Renaldo Fiddler, [facility] Acute Rehab Services Pager [phone]

## 2012-11-30 NOTE — Preoperative (Signed)
Beta Blockers   Reason not to administer Beta Blockers:Not Applicable 

## 2012-11-30 NOTE — Progress Notes (Signed)
UR COMPLETED  

## 2012-11-30 NOTE — Interval H&P Note (Signed)
History and Physical Interval Note:  11/30/2012 7:11 AM  Lindsay Frost  has presented today for surgery, with the diagnosis of OSTEOARTHRITIS LEFT KNEE VARUS  The various methods of treatment have been discussed with the patient and family. After consideration of risks, benefits and other options for treatment, the patient has consented to  Procedure(s) with comments: TOTAL KNEE ARTHROPLASTY (Left) - DEPUY, CATEGORY 3 as a surgical intervention .  The patient's history has been reviewed, patient examined, no change in status, stable for surgery.  I have reviewed the patient's chart and labs.  Questions were answered to the patient's satisfaction.     Nestor Lewandowsky

## 2012-12-01 ENCOUNTER — Encounter (HOSPITAL_COMMUNITY): Payer: Self-pay | Admitting: Orthopedic Surgery

## 2012-12-01 LAB — CBC
HCT: 28.8 % — ABNORMAL LOW (ref 36.0–46.0)
Hemoglobin: 9.5 g/dL — ABNORMAL LOW (ref 12.0–15.0)
MCH: 30 pg (ref 26.0–34.0)
MCHC: 33 g/dL (ref 30.0–36.0)
MCV: 90.9 fL (ref 78.0–100.0)
Platelets: 240 10*3/uL (ref 150–400)
RBC: 3.17 MIL/uL — ABNORMAL LOW (ref 3.87–5.11)
RDW: 13.6 % (ref 11.5–15.5)
WBC: 9.3 10*3/uL (ref 4.0–10.5)

## 2012-12-01 LAB — BASIC METABOLIC PANEL
BUN: 9 mg/dL (ref 6–23)
CO2: 26 mEq/L (ref 19–32)
Calcium: 8.3 mg/dL — ABNORMAL LOW (ref 8.4–10.5)
Chloride: 109 mEq/L (ref 96–112)
Creatinine, Ser: 0.65 mg/dL (ref 0.50–1.10)
GFR calc Af Amer: >90 mL/min (ref >90)
GFR calc non Af Amer: >90 mL/min (ref >90)
Glucose, Bld: 106 mg/dL — ABNORMAL HIGH (ref 70–99)
Potassium: 3.9 mEq/L (ref 3.5–5.1)
Sodium: 142 mEq/L (ref 135–145)

## 2012-12-01 NOTE — Progress Notes (Signed)
[facility]  Patient Status: New  [facility] is providing the following services: PT - referral from MD office.  Noted plan is to go to [facility] for rehab  If patient discharges after hours, please call [phone].   Jodene Nam 12/01/2012, 4:46 PM

## 2012-12-01 NOTE — Clinical Social Work Psychosocial (Addendum)
Clinical Social Work Department  BRIEF PSYCHOSOCIAL ASSESSMENT  Patient: Lindsay Frost  Account Number: [id number]  Admit date: 11/30/12 Clinical Social Worker Sabino Niemann, MSW Date/Time: 12/01/2012  Referred by: Physician Date Referred: 11/30/12 Referred for   SNF Placement   Other Referral:  Interview type: Patient  Other interview type: PSYCHOSOCIAL DATA  Living Status: Alone Admitted from facility:  Level of care:  Primary support name: Deroshia,Vicky Primary support relationship to patient: sister Degree of support available:  Strong and vested  CURRENT CONCERNS  Current Concerns   Post-Acute Placement   Other Concerns:  SOCIAL WORK ASSESSMENT / PLAN  CSW met with pt re: PT recommendation for SNF.   Pt lives alone  CSW explained placement process and answered questions.   Pt reports she pre-registered at [facility]   CSW completed FL2 and SW sent clinicals to [facility]    Assessment/plan status: Information/Referral to [facility]  Other assessment/ plan:  Information/referral to community resources:  SNF      PATIENT'S/FAMILY'S RESPONSE TO PLAN OF CARE:  Pt  reports she is agreeable to ST SNF in order to increase strength and independence with mobility prior to return home  Pt verbalized understanding of placement process and appreciation for CSW assist.   Sabino Niemann, MSW [phone]

## 2012-12-01 NOTE — Progress Notes (Signed)
Physical Therapy Treatment Patient Details Name: Lindsay Frost MRN: [id number] DOB: [birth year] Today's Date: 12/01/2012 Time: 3086-5784 PT Time Calculation (min): 24 min  PT Assessment / Plan / Recommendation Comments on Treatment Session  Patient s/p L TKA. Able to ambulate this morning and progress with therex. Patient lives with daugther who is not available during the day. Patient planning to discharge to [facility] for rehab prior to going home    Follow Up Recommendations  SNF     Does the patient have the potential to tolerate intense rehabilitation     Barriers to Discharge        Equipment Recommendations  Rolling walker with 5" wheels    Recommendations for Other Services    Frequency 7X/week   Plan Discharge plan remains appropriate;Frequency remains appropriate    Precautions / Restrictions Precautions Precautions: Knee Restrictions Weight Bearing Restrictions: Yes LLE Weight Bearing: Weight bearing as tolerated   Pertinent Vitals/Pain     Mobility  Bed Mobility Supine to Sit: 4: Min guard;HOB elevated;With rails Sitting - Scoot to Edge of Bed: 5: Supervision Details for Bed Mobility Assistance: Verbal cues for technique. Transfers Sit to Stand: 4: Min assist;With upper extremity assist;From bed Stand to Sit: 4: Min assist;With upper extremity assist;With armrests;To chair/3-in-1 Details for Transfer Assistance: Verbal cues for hand placement and technique. A to ensure balance as patient with slight posterior LOBx2 Ambulation/Gait Ambulation/Gait Assistance: 4: Min guard Ambulation Distance (Feet): 60 Feet Assistive device: Rolling walker Ambulation/Gait Assistance Details: Cues for gait sequence, posture and management of RW Gait Pattern: Step-to pattern;Decreased step length - right;Decreased step length - left;Antalgic    Exercises Total Joint Exercises Ankle Circles/Pumps: AROM;Both;10 reps;Seated Quad Sets: AROM;Left;10 reps Heel Slides:  AAROM;Left;10 reps Hip ABduction/ADduction: AAROM;Left;10 reps Straight Leg Raises: AAROM;Left;10 reps   PT Diagnosis:    PT Problem List:   PT Treatment Interventions:     PT Goals Acute Rehab PT Goals PT Goal: Supine/Side to Sit - Progress: Progressing toward goal PT Goal: Sit to Stand - Progress: Progressing toward goal PT Goal: Ambulate - Progress: Progressing toward goal PT Goal: Perform Home Exercise Program - Progress: Progressing toward goal  Visit Information  Last PT Received On: 12/01/12 Assistance Needed: +1    Subjective Data      Cognition  Cognition Overall Cognitive Status: Appears within functional limits for tasks assessed/performed Arousal/Alertness: Awake/alert Orientation Level: Oriented X4 / Intact Behavior During Session: [facility] for tasks performed    Balance     End of Session CPM Left Knee CPM Left Knee: On Left Knee Flexion (Degrees): 60 Left Knee Extension (Degrees): 0 Additional Comments: trapeze bar   GP     Fredrich Birks 12/01/2012, 9:47 AM 12/01/2012 Fredrich Birks PTA [phone] pager [phone] office

## 2012-12-01 NOTE — Progress Notes (Deleted)
Physical Therapy Treatment Patient Details Name: Lindsay Frost MRN: [id number] DOB: Feb 05, 1956 Today's Date: 12/01/2012 Time: 6644-0347 PT Time Calculation (min): 24 min  PT Assessment / Plan / Recommendation Comments on Treatment Session  Patient s/p L TKA. Able to ambulate this morning and progress with therex. Patient lives with daugther who is not available during the day. Patient planning to discharge to [facility] for rehab prior to going home    Follow Up Recommendations  SNF     Does the patient have the potential to tolerate intense rehabilitation     Barriers to Discharge        Equipment Recommendations  Rolling walker with 5" wheels    Recommendations for Other Services    Frequency 7X/week   Plan Discharge plan remains appropriate;Frequency remains appropriate    Precautions / Restrictions Precautions Precautions: Knee Restrictions Weight Bearing Restrictions: Yes LLE Weight Bearing: Weight bearing as tolerated   Pertinent Vitals/Pain 8/10 R knee pain. Patient medicated    Mobility  Bed Mobility Supine to Sit: 4: Min guard;HOB elevated;With rails Sitting - Scoot to Edge of Bed: 5: Supervision Details for Bed Mobility Assistance: Verbal cues for technique. Transfers Sit to Stand: 4: Min assist;With upper extremity assist;From bed Stand to Sit: 4: Min assist;With upper extremity assist;With armrests;To chair/3-in-1 Details for Transfer Assistance: Verbal cues for hand placement and technique. A to ensure balance as patient with slight posterior LOBx2 Ambulation/Gait Ambulation/Gait Assistance: 4: Min guard Ambulation Distance (Feet): 60 Feet Assistive device: Rolling walker Ambulation/Gait Assistance Details: Cues for gait sequence, posture and management of RW Gait Pattern: Step-to pattern;Decreased step length - right;Decreased step length - left;Antalgic    Exercises Total Joint Exercises Ankle Circles/Pumps: AROM;Both;10 reps;Seated Quad Sets:  AROM;Left;10 reps Heel Slides: AAROM;Left;10 reps Hip ABduction/ADduction: AAROM;Left;10 reps Straight Leg Raises: AAROM;Left;10 reps   PT Diagnosis:    PT Problem List:   PT Treatment Interventions:     PT Goals Acute Rehab PT Goals PT Goal: Supine/Side to Sit - Progress: Progressing toward goal PT Goal: Sit to Stand - Progress: Progressing toward goal PT Goal: Ambulate - Progress: Progressing toward goal PT Goal: Perform Home Exercise Program - Progress: Progressing toward goal  Visit Information  Last PT Received On: 12/01/12 Assistance Needed: +1    Subjective Data      Cognition  Cognition Overall Cognitive Status: Appears within functional limits for tasks assessed/performed Arousal/Alertness: Awake/alert Orientation Level: Oriented X4 / Intact Behavior During Session: [facility] for tasks performed    Balance     End of Session CPM Left Knee CPM Left Knee: On Left Knee Flexion (Degrees): 60 Left Knee Extension (Degrees): 0 Additional Comments: trapeze bar   GP     Fredrich Birks 12/01/2012, 9:48 AM  12/01/2012 Fredrich Birks PTA [phone] pager [phone] office

## 2012-12-01 NOTE — Progress Notes (Signed)
Physical Therapy Treatment Patient Details Name: Lindsay Frost MRN: [id number] DOB: July 18, 1956 Today's Date: 12/01/2012 Time: 1350-1407 PT Time Calculation (min): 17 min  PT Assessment / Plan / Recommendation Comments on Treatment Session  Patient progressing well with ambulation. Patient requested to stay up in recliner at end of session and to get back in CPM later this afternoon    Follow Up Recommendations  SNF     Does the patient have the potential to tolerate intense rehabilitation     Barriers to Discharge        Equipment Recommendations  Rolling walker with 5" wheels    Recommendations for Other Services    Frequency 7X/week   Plan Discharge plan remains appropriate;Frequency remains appropriate    Precautions / Restrictions Precautions Precautions: Knee Restrictions LLE Weight Bearing: Weight bearing as tolerated   Pertinent Vitals/Pain     Mobility  Bed Mobility Bed Mobility: Not assessed Transfers Sit to Stand: 4: Min guard;From chair/3-in-1;With upper extremity assist Stand to Sit: 4: Min guard;To chair/3-in-1;With upper extremity assist Details for Transfer Assistance: Verbal cues for hand placement and technique.  Ambulation/Gait Ambulation/Gait Assistance: 4: Min guard Ambulation Distance (Feet): 100 Feet Assistive device: Rolling walker Ambulation/Gait Assistance Details: Cues for posture and to look forward. Patient complained of UE soreness. Encouraged to increase weight through LEs Gait Pattern: Step-to pattern;Decreased step length - right;Decreased step length - left;Antalgic    Exercises     PT Diagnosis:    PT Problem List:   PT Treatment Interventions:     PT Goals Acute Rehab PT Goals PT Goal: Sit to Stand - Progress: Progressing toward goal PT Goal: Ambulate - Progress: Progressing toward goal  Visit Information  Last PT Received On: 12/01/12 Assistance Needed: +1    Subjective Data      Cognition  Cognition Overall  Cognitive Status: Appears within functional limits for tasks assessed/performed Arousal/Alertness: Awake/alert Orientation Level: Oriented X4 / Intact Behavior During Session: [facility] for tasks performed    Balance     End of Session PT - End of Session Equipment Utilized During Treatment: Gait belt Activity Tolerance: Patient tolerated treatment well Patient left: in chair;with call bell/phone within reach Nurse Communication: Mobility status   GP     Fredrich Birks 12/01/2012, 2:22 PM 12/01/2012 Fredrich Birks PTA [phone] pager [phone] office

## 2012-12-01 NOTE — Clinical Social Work Placement (Addendum)
Clinical Social Work Department  CLINICAL SOCIAL WORK PLACEMENT NOTE  12/01/2012 Patient: Lindsay Frost Account Number: [id number] Admit date: 08/19/2012  Clinical Social Worker: Sabino Niemann LCSWA Date/time: 08/23/2012 11:30 AM  Clinical Social Work is seeking post-discharge placement for this patient at the following level of care: SKILLED NURSING (*CSW will update this form in Epic as items are completed)  12/01/2012 Patient/family provided with [facility] Department of Clinical Social Work's list of facilities offering this level of care within the geographic area requested by the patient (or if unable, by the patient's family).  12/01/2012 Patient/family informed of their freedom to choose among providers that offer the needed level of care, that participate in Medicare, Medicaid or managed care program needed by the patient, have an available bed and are willing to accept the patient.  12/01/2012  Patient/family informed of [facility]' ownership interest in [facility], as well as of the fact that they are under no obligation to receive care at this facility.  PASARR submitted to EDS on 12/01/2012 PASARR number received from [employer]DS on 13/18/2014 FL2 transmitted to all facilities in geographic area requested by pt/family on 12/01/2012  FL2 transmitted to all facilities within larger geographic area on  Patient informed that his/her managed care company has contracts with or will negotiate with certain facilities, including the following:  Patient/family informed of bed offers received: Patient pre-registered at [facility] Patient chooses bed at [facility] Physician recommends and patient chooses bed at  Patient to be transferred to on 12/03/2012 Patient to be transferred to facility by [facility] The following physician request were entered in Epic:  Additional Comments:   Sabino Niemann, MSW, LCSWA [phone] Signing Off

## 2012-12-01 NOTE — Progress Notes (Signed)
Patient ID: Lindsay Frost, female   DOB: 08-26-1956, 56 y.o.   MRN: [id number] PATIENT ID: Lindsay Frost  MRN: [id number]  DOB/AGE:  1956/06/14 / 57 y.o.  1 Day Post-Op Procedure(s) (LRB): LEFT TOTAL KNEE ARTHROPLASTY (Left)    PROGRESS NOTE Subjective: Patient is alert, oriented, no Nausea, no Vomiting, yes passing gas, no Bowel Movement. Taking PO well. Denies SOB, Chest or Calf Pain. Using Incentive Spirometer, PAS in place. Ambulate WBAT, CPM 0-50 Patient reports pain as 3 on 0-10 scale  .    Objective: Vital signs in last 24 hours: Filed Vitals:   11/30/12 1756 11/30/12 2154 12/01/12 0243 12/01/12 0618  BP: 110/58 88/50 110/70 114/78  Pulse: 89 65 74 66  Temp: 98.1 F (36.7 C) 98.3 F (36.8 C) 98.9 F (37.2 C) 98.7 F (37.1 C)  TempSrc:  Oral Oral Oral  Resp: 18 16 16 16   SpO2: 98% 99% 98% 97%      Intake/Output from previous day: I/O last 3 completed shifts: In: 2170 [P.O.:720; I.V.:1450] Out: 2650 [Urine:2100; Drains:550]   Intake/Output this shift:     LABORATORY DATA:  Recent Labs  12/01/12 0420  WBC 9.3  HGB 9.5*  HCT 28.8*  PLT 240  NA 142  K 3.9  CL 109  CO2 26  BUN 9  CREATININE 0.65  GLUCOSE 106*  CALCIUM 8.3*    Examination: Neurologically intact ABD soft Neurovascular intact Sensation intact distally Intact pulses distally Dorsiflexion/Plantar flexion intact Incision: scant drainage No cellulitis present Compartment soft} Blood and plasma separated in drain indicating minimal recent drainage, drain pulled without difficulty.  Assessment:   1 Day Post-Op Procedure(s) (LRB): LEFT TOTAL KNEE ARTHROPLASTY (Left) ADDITIONAL DIAGNOSIS:    Plan: PT/OT WBAT, CPM 5/hrs day until ROM 0-90 degrees, then D/C CPM DVT Prophylaxis:  SCDx72hrs, ASA 325 mg BID x 2 weeks DISCHARGE PLAN: Skilled Nursing Facility/Rehab, [facility] DISCHARGE NEEDS: HHPT, HHRN, CPM, Walker and 3-in-1 comode seat     Jesusmanuel Erbes J 12/01/2012, 7:24 AM

## 2012-12-02 DIAGNOSIS — M1712 Unilateral primary osteoarthritis, left knee: Secondary | ICD-10-CM

## 2012-12-02 HISTORY — DX: Unilateral primary osteoarthritis, left knee: M17.12

## 2012-12-02 LAB — CBC
HCT: 26.5 % — ABNORMAL LOW (ref 36.0–46.0)
Hemoglobin: 8.8 g/dL — ABNORMAL LOW (ref 12.0–15.0)
MCH: 29.4 pg (ref 26.0–34.0)
MCHC: 33.2 g/dL (ref 30.0–36.0)
MCV: 88.6 fL (ref 78.0–100.0)
Platelets: 200 10*3/uL (ref 150–400)
RBC: 2.99 MIL/uL — ABNORMAL LOW (ref 3.87–5.11)
RDW: 13.9 % (ref 11.5–15.5)
WBC: 7.5 10*3/uL (ref 4.0–10.5)

## 2012-12-02 MED ORDER — METHOCARBAMOL 500 MG PO TABS: 500.0000 mg | ORAL_TABLET | Freq: Four times a day (QID) | ORAL | Status: DC | PRN

## 2012-12-02 MED ORDER — CELECOXIB 200 MG PO CAPS: 200.0000 mg | ORAL_CAPSULE | Freq: Two times a day (BID) | ORAL | Status: DC

## 2012-12-02 MED ORDER — OXYCODONE-ACETAMINOPHEN 5-325 MG PO TABS: 1.0000 | ORAL_TABLET | ORAL | Status: DC | PRN

## 2012-12-02 MED ORDER — BISACODYL 5 MG PO TBEC: 5.0000 mg | DELAYED_RELEASE_TABLET | Freq: Every day | ORAL | Status: DC | PRN

## 2012-12-02 MED ORDER — ASPIRIN 325 MG PO TBEC: 325.0000 mg | DELAYED_RELEASE_TABLET | Freq: Two times a day (BID) | ORAL | Status: DC

## 2012-12-02 NOTE — Progress Notes (Signed)
Physical Therapy Treatment Patient Details Name: Lindsay Frost MRN: [id number] DOB: 04-29-1956 Today's Date: 12/02/2012 Time: 4098-1191 PT Time Calculation (min): 26 min  PT Assessment / Plan / Recommendation Comments on Treatment Session  Patient with increased knee pain this morning. Able to tolerate ambulation and increased therex well. Continue with current POC    Follow Up Recommendations  SNF     Does the patient have the potential to tolerate intense rehabilitation     Barriers to Discharge        Equipment Recommendations  Rolling walker with 5" wheels    Recommendations for Other Services    Frequency 7X/week   Plan Discharge plan remains appropriate;Frequency remains appropriate    Precautions / Restrictions Precautions Precautions: Knee Restrictions Weight Bearing Restrictions: Yes LLE Weight Bearing: Weight bearing as tolerated   Pertinent Vitals/Pain 8/10 knee pain. RN aware    Mobility  Bed Mobility Supine to Sit: 5: Supervision Sitting - Scoot to Edge of Bed: 5: Supervision Transfers Sit to Stand: 4: Min guard;With upper extremity assist;From bed Stand to Sit: With upper extremity assist;4: Min guard;To chair/3-in-1 Details for Transfer Assistance: Verbal cues for hand placement. Patient continues to be a little unsteady with standing Ambulation/Gait Ambulation/Gait Assistance: 4: Min guard Ambulation Distance (Feet): 110 Feet Assistive device: Rolling walker Ambulation/Gait Assistance Details: Cues for posture throughout and to increase weight through LEs Gait Pattern: Step-to pattern;Decreased step length - right;Decreased step length - left;Antalgic    Exercises Total Joint Exercises Quad Sets: AROM;Left;15 reps Short Arc QuadBarbaraann Boys;Left;10 reps Heel Slides: AAROM;Left;15 reps Hip ABduction/ADduction: AAROM;Left;15 reps Straight Leg Raises: AAROM;Left;15 reps   PT Diagnosis:    PT Problem List:   PT Treatment Interventions:     PT  Goals Acute Rehab PT Goals PT Goal: Supine/Side to Sit - Progress: Progressing toward goal PT Goal: Sit to Stand - Progress: Progressing toward goal PT Goal: Ambulate - Progress: Progressing toward goal PT Goal: Perform Home Exercise Program - Progress: Progressing toward goal  Visit Information  Last PT Received On: 12/02/12 Assistance Needed: +1    Subjective Data      Cognition  Cognition Overall Cognitive Status: Appears within functional limits for tasks assessed/performed Arousal/Alertness: Awake/alert Orientation Level: Oriented X4 / Intact Behavior During Session: [facility] for tasks performed    Balance     End of Session PT - End of Session Equipment Utilized During Treatment: Gait belt Activity Tolerance: Patient tolerated treatment well Patient left: in chair;with call bell/phone within reach Nurse Communication: Mobility status CPM Left Knee CPM Left Knee: Off Additional Comments: trapeze bar   GP     Fredrich Birks 12/02/2012, 8:30 AM 12/02/2012 Fredrich Birks PTA [phone] pager [phone] office

## 2012-12-02 NOTE — Progress Notes (Addendum)
CARE MANAGEMENT NOTE 12/02/2012  Patient:  Lindsay Frost, Lindsay Frost   Account Number:  [id number]  Date Initiated:  12/02/2012  Documentation initiated by:  Vance Peper  Subjective/Objective Assessment:   57 yr old female s/p left total knee arthroplasty.     Action/Plan:   Patient is going to [facility] for shortterm rehab. Social worker is aware.   Anticipated DC Date:  12/03/2012   Anticipated DC Plan:  SKILLED NURSING FACILITY  In-house referral  Clinical Social Worker      DC Planning Services  CM consult      Choice offered to / List presented to:             Status of service:  Completed, signed off Medicare Important Message given?   (If response is "NO", the following Medicare IM given date fields will be blank) Date Medicare IM given:   Date Additional Medicare IM given:    Discharge Disposition:  SKILLED NURSING FACILITY  Per UR Regulation:    If discussed at Long Length of Stay Meetings, dates discussed:    Comments:

## 2012-12-02 NOTE — Progress Notes (Signed)
OT Cancellation Note  Patient Details Name: Lindsay Frost MRN: [id number] DOB: 1956-06-01   Cancelled Treatment:    Reason Eval/Treat Not Completed: Other (comment) Pt plans to rehab at [facility]. Will defer OT to SNF. If eval needed, please reorder. OT signing off  [facility], OTR/L  [phone] 12/02/2012 12/02/2012, 10:26 AM

## 2012-12-02 NOTE — Discharge Summary (Signed)
Patient ID: Lindsay Frost MRN: [id number] DOB/AGE: June 29, 1956 57 y.o.  Admit date: 11/30/2012 Discharge date: 12/03/12  Admission Diagnoses:  Principal Problem:   Osteoarthritis of left knee   Discharge Diagnoses:  Same  Past Medical History  Diagnosis Date  . Headache     migraines  . Arthritis   . History of migraine     last one about 2wks ago-takes Imitrex prn  . Joint pain   . Joint swelling   . Back pain     arthritis  . GERD (gastroesophageal reflux disease)     takes Omeprazole daily  . History of gastric ulcer   . Depression     takes cymbalta daily    Surgeries: Procedure(s): LEFT TOTAL KNEE ARTHROPLASTY on 11/30/2012   Consultants:    Discharged Condition: Improved  Hospital Course: RUTA CAPECE is an 57 y.o. female who was admitted 11/30/2012 for operative treatment ofOsteoarthritis of left knee. Patient has severe unremitting pain that affects sleep, daily activities, and work/hobbies. After pre-op clearance the patient was taken to the operating room on 11/30/2012 and underwent  Procedure(s): LEFT TOTAL KNEE ARTHROPLASTY.    Patient was given perioperative antibiotics: Anti-infectives   Start     Dose/Rate Route Frequency Ordered Stop   11/30/12 0822  cefUROXime (ZINACEF) injection  Status:  Discontinued       As needed 11/30/12 0822 11/30/12 0910   11/30/12 0600  ceFAZolin (ANCEF) IVPB 2 g/50 mL premix     2 g 100 mL/hr over 30 Minutes Intravenous On call to O.R. 11/29/12 1243 11/30/12 0735       Patient was given sequential compression devices, early ambulation, and chemoprophylaxis to prevent DVT.  Patient benefited maximally from hospital stay and there were no complications.    Recent vital signs: Patient Vitals for the past 24 hrs:  BP Temp Temp src Pulse Resp SpO2  12/02/12 0630 90/60 mmHg - - - - -  12/02/12 0608 85/50 mmHg 98 F (36.7 C) Oral 62 16 94 %  12/02/12 0400 - - - - 16 97 %  12/01/12 2349 - - - - 16 97 %  12/01/12 2001  92/51 mmHg 98.7 F (37.1 C) Oral 60 16 97 %  12/01/12 2000 - - - - 16 97 %  12/01/12 1549 - - - - 16 -  12/01/12 1355 113/50 mmHg 98.1 F (36.7 C) - 87 18 98 %  12/01/12 1141 - - - - 16 -     Recent laboratory studies:  Recent Labs  12/01/12 0420 12/02/12 0430  WBC 9.3 7.5  HGB 9.5* 8.8*  HCT 28.8* 26.5*  PLT 240 200  NA 142  --   K 3.9  --   CL 109  --   CO2 26  --   BUN 9  --   CREATININE 0.65  --   GLUCOSE 106*  --   CALCIUM 8.3*  --      Discharge Medications:     Medication List    STOP taking these medications       BC HEADACHE POWDER PO     HYDROcodone-acetaminophen 5-500 MG per tablet  Commonly known as:  VICODIN      TAKE these medications       aspirin 325 MG EC tablet  Take 1 tablet (325 mg total) by mouth 2 (two) times daily.     bisacodyl 5 MG EC tablet  Commonly known as:  DULCOLAX  Take 1 tablet (5  mg total) by mouth daily as needed.     celecoxib 200 MG capsule  Commonly known as:  CELEBREX  Take 1 capsule (200 mg total) by mouth every 12 (twelve) hours.     DULoxetine 60 MG capsule  Commonly known as:  CYMBALTA  Take 60 mg by mouth 2 (two) times daily.     methocarbamol 500 MG tablet  Commonly known as:  ROBAXIN  Take 1 tablet (500 mg total) by mouth every 6 (six) hours as needed.     omeprazole 20 MG capsule  Commonly known as:  PRILOSEC  Take 20 mg by mouth 2 (two) times daily.     oxyCODONE-acetaminophen 5-325 MG per tablet  Commonly known as:  ROXICET  Take 1-2 tablets by mouth every 4 (four) hours as needed for pain.     SUMAtriptan 50 MG tablet  Commonly known as:  IMITREX  Take 50 mg by mouth every 2 (two) hours as needed.        Diagnostic Studies: Dg Chest 2 View  11/24/2012  *RADIOLOGY REPORT*  Clinical Data: Cough; preoperative evaluation  CHEST - 2 VIEW  Comparison: February 19, 2007  Findings:  Lungs clear.  Heart size and pulmonary vascularity are normal.  No adenopathy.  No bone lesions.  IMPRESSION: No  abnormality noted.   Original Report Authenticated By: Bretta Bang, M.D.     Disposition: 01-Home or Self Care      Discharge Orders   Future Orders Complete By Expires     Call MD for:  redness, tenderness, or signs of infection (pain, swelling, redness, odor or green/yellow discharge around incision site)  As directed     Call MD for:  severe uncontrolled pain  As directed     Call MD for:  temperature >100.4  As directed     Change dressing (specify)  As directed     Comments:      Dressing change as needed.    Discharge instructions  As directed     Comments:      F/U with Dr. Turner Daniels as scheduled (POD #14)    Driving Restrictions  As directed     Comments:      No driving for 2 weeks.    Increase activity slowly  As directed     May shower / Bathe  As directed     Walker   As directed           Signed: Andreu Drudge M. 12/02/2012, 8:09 AM

## 2012-12-02 NOTE — Progress Notes (Signed)
PATIENT ID: Lindsay Frost  MRN: [id number]  DOB/AGE:  1955/10/16 / 57 y.o.  2 Days Post-Op Procedure(s) (LRB): LEFT TOTAL KNEE ARTHROPLASTY (Left)    PROGRESS NOTE Subjective: Patient is alert, oriented, no Nausea, no Vomiting, yes passing gas, no Bowel Movement. Taking PO well. Denies SOB, Chest or Calf Pain. Using Incentive Spirometer, PAS in place. Ambulating slowly with PT. Patient reports pain as 7 on 0-10 scale  .    Objective: Vital signs in last 24 hours: Filed Vitals:   12/01/12 2349 12/02/12 0400 12/02/12 0608 12/02/12 0630  BP:   85/50 90/60  Pulse:   62   Temp:   98 F (36.7 C)   TempSrc:   Oral   Resp: 16 16 16    SpO2: 97% 97% 94%       Intake/Output from previous day: I/O last 3 completed shifts: In: 1280 [P.O.:1280] Out: 1350 [Urine:1200; Drains:150]   Intake/Output this shift:     LABORATORY DATA:  Recent Labs  12/01/12 0420 12/02/12 0430  WBC 9.3 7.5  HGB 9.5* 8.8*  HCT 28.8* 26.5*  PLT 240 200  NA 142  --   K 3.9  --   CL 109  --   CO2 26  --   BUN 9  --   CREATININE 0.65  --   GLUCOSE 106*  --   CALCIUM 8.3*  --     Examination: Neurologically intact ABD soft Neurovascular intact Sensation intact distally Intact pulses distally Dorsiflexion/Plantar flexion intact Incision: dressing C/D/I}  Assessment:   2 Days Post-Op Procedure(s) (LRB): LEFT TOTAL KNEE ARTHROPLASTY (Left) ADDITIONAL DIAGNOSIS:  none  Plan: PT/OT WBAT, CPM 5/hrs day until ROM 0-90 degrees, then D/C CPM DVT Prophylaxis:  SCDx72hrs, ASA 325 mg BID x 2 weeks DISCHARGE PLAN: Skilled Nursing Facility/Rehab tomorrow DISCHARGE NEEDS: HHPT, HHRN, CPM, Walker and 3-in-1 comode seat     Lindsay Frost M. 12/02/2012, 8:05 AM

## 2012-12-02 NOTE — Progress Notes (Signed)
Physical Therapy Treatment Patient Details Name: RACINE ERBY MRN: [id number] DOB: 02-29-1956 Today's Date: 12/02/2012 Time: 4098-1191 PT Time Calculation (min): 14 min  PT Assessment / Plan / Recommendation Comments on Treatment Session  Patient agreeable to therex. She had just returned to bed for dressing change. Educated on importance of extension stretch.     Follow Up Recommendations  SNF     Does the patient have the potential to tolerate intense rehabilitation     Barriers to Discharge        Equipment Recommendations  Rolling walker with 5" wheels    Recommendations for Other Services    Frequency 7X/week   Plan Discharge plan remains appropriate;Frequency remains appropriate    Precautions / Restrictions Precautions Precautions: Knee Precaution Comments: Provided education on precautions to patient; Patient educated on extension stretch Restrictions LLE Weight Bearing: Weight bearing as tolerated   Pertinent Vitals/Pain     Mobility       Exercises Total Joint Exercises Quad Sets: AROM;Left;15 reps Short Arc QuadBarbaraann Boys;Left;10 reps Heel Slides: AAROM;Left;15 reps Hip ABduction/ADduction: AAROM;Left;15 reps Straight Leg Raises: AAROM;Left;15 reps   PT Diagnosis:    PT Problem List:   PT Treatment Interventions:     PT Goals Acute Rehab PT Goals PT Goal: Perform Home Exercise Program - Progress: Progressing toward goal  Visit Information  Last PT Received On: 12/02/12    Subjective Data      Cognition  Cognition Overall Cognitive Status: Appears within functional limits for tasks assessed/performed Arousal/Alertness: Awake/alert Orientation Level: Oriented X4 / Intact Behavior During Session: [facility] for tasks performed    Balance     End of Session PT - End of Session Activity Tolerance: Patient limited by pain Patient left: in bed;with call bell/phone within reach   GP     Fredrich Birks 12/02/2012, 2:43  PM 12/02/2012 Fredrich Birks PTA [phone] pager [phone] office

## 2012-12-03 DIAGNOSIS — S8990XA Unspecified injury of unspecified lower leg, initial encounter: Secondary | ICD-10-CM | POA: Diagnosis not present

## 2012-12-03 DIAGNOSIS — R52 Pain, unspecified: Secondary | ICD-10-CM | POA: Diagnosis not present

## 2012-12-03 DIAGNOSIS — G43909 Migraine, unspecified, not intractable, without status migrainosus: Secondary | ICD-10-CM | POA: Diagnosis not present

## 2012-12-03 DIAGNOSIS — R059 Cough, unspecified: Secondary | ICD-10-CM | POA: Diagnosis not present

## 2012-12-03 DIAGNOSIS — R279 Unspecified lack of coordination: Secondary | ICD-10-CM | POA: Diagnosis not present

## 2012-12-03 DIAGNOSIS — S99919A Unspecified injury of unspecified ankle, initial encounter: Secondary | ICD-10-CM | POA: Diagnosis not present

## 2012-12-03 DIAGNOSIS — Z471 Aftercare following joint replacement surgery: Secondary | ICD-10-CM | POA: Diagnosis not present

## 2012-12-03 DIAGNOSIS — F329 Major depressive disorder, single episode, unspecified: Secondary | ICD-10-CM | POA: Diagnosis not present

## 2012-12-03 DIAGNOSIS — Z7982 Long term (current) use of aspirin: Secondary | ICD-10-CM | POA: Diagnosis not present

## 2012-12-03 DIAGNOSIS — M6281 Muscle weakness (generalized): Secondary | ICD-10-CM | POA: Diagnosis not present

## 2012-12-03 DIAGNOSIS — Z8739 Personal history of other diseases of the musculoskeletal system and connective tissue: Secondary | ICD-10-CM | POA: Diagnosis not present

## 2012-12-03 DIAGNOSIS — M79609 Pain in unspecified limb: Secondary | ICD-10-CM | POA: Diagnosis not present

## 2012-12-03 DIAGNOSIS — M7989 Other specified soft tissue disorders: Secondary | ICD-10-CM | POA: Diagnosis not present

## 2012-12-03 DIAGNOSIS — M62838 Other muscle spasm: Secondary | ICD-10-CM | POA: Diagnosis not present

## 2012-12-03 DIAGNOSIS — M1990 Unspecified osteoarthritis, unspecified site: Secondary | ICD-10-CM | POA: Diagnosis not present

## 2012-12-03 DIAGNOSIS — F3289 Other specified depressive episodes: Secondary | ICD-10-CM | POA: Diagnosis not present

## 2012-12-03 DIAGNOSIS — Z79899 Other long term (current) drug therapy: Secondary | ICD-10-CM | POA: Diagnosis not present

## 2012-12-03 DIAGNOSIS — M1710 Unilateral primary osteoarthritis, unspecified knee: Secondary | ICD-10-CM | POA: Diagnosis not present

## 2012-12-03 DIAGNOSIS — D62 Acute posthemorrhagic anemia: Secondary | ICD-10-CM | POA: Diagnosis not present

## 2012-12-03 DIAGNOSIS — Z96659 Presence of unspecified artificial knee joint: Secondary | ICD-10-CM | POA: Diagnosis not present

## 2012-12-03 DIAGNOSIS — Z8719 Personal history of other diseases of the digestive system: Secondary | ICD-10-CM | POA: Diagnosis not present

## 2012-12-03 DIAGNOSIS — R609 Edema, unspecified: Secondary | ICD-10-CM | POA: Diagnosis not present

## 2012-12-03 DIAGNOSIS — G43901 Migraine, unspecified, not intractable, with status migrainosus: Secondary | ICD-10-CM | POA: Diagnosis not present

## 2012-12-03 DIAGNOSIS — R269 Unspecified abnormalities of gait and mobility: Secondary | ICD-10-CM | POA: Diagnosis not present

## 2012-12-03 DIAGNOSIS — M25569 Pain in unspecified knee: Secondary | ICD-10-CM | POA: Diagnosis not present

## 2012-12-03 DIAGNOSIS — R05 Cough: Secondary | ICD-10-CM | POA: Diagnosis not present

## 2012-12-03 DIAGNOSIS — K219 Gastro-esophageal reflux disease without esophagitis: Secondary | ICD-10-CM | POA: Diagnosis not present

## 2012-12-03 LAB — CBC
HCT: 26.2 % — ABNORMAL LOW (ref 36.0–46.0)
Hemoglobin: 8.7 g/dL — ABNORMAL LOW (ref 12.0–15.0)
MCH: 29.4 pg (ref 26.0–34.0)
MCHC: 33.2 g/dL (ref 30.0–36.0)
MCV: 88.5 fL (ref 78.0–100.0)
Platelets: 210 10*3/uL (ref 150–400)
RBC: 2.96 MIL/uL — ABNORMAL LOW (ref 3.87–5.11)
RDW: 13.9 % (ref 11.5–15.5)
WBC: 6.3 10*3/uL (ref 4.0–10.5)

## 2012-12-03 NOTE — Progress Notes (Signed)
Physical Therapy Treatment Patient Details Name: Lindsay Frost MRN: [id number] DOB: 1956-08-06 Today's Date: 12/03/2012 Time: 0812-0828 PT Time Calculation (min): 16 min  PT Assessment / Plan / Recommendation Comments on Treatment Session  Patient progressing well with ambulation. Appears to have better pain control and tolerating increased therex and ambulation. Patient hopeful to [facility] to work with rehab to increase functional independence prior to returning to daughters house where she will be home most of the day alon.    Follow Up Recommendations  SNF     Does the patient have the potential to tolerate intense rehabilitation     Barriers to Discharge        Equipment Recommendations       Recommendations for Other Services    Frequency     Plan Discharge plan remains appropriate;Frequency remains appropriate    Precautions / Restrictions Precautions Precautions: Knee Restrictions LLE Weight Bearing: Weight bearing as tolerated   Pertinent Vitals/Pain     Mobility  Bed Mobility Bed Mobility: Not assessed Transfers Sit to Stand: 5: Supervision;With upper extremity assist Stand to Sit: 5: Supervision;With upper extremity assist Ambulation/Gait Ambulation/Gait Assistance: 5: Supervision Ambulation Distance (Feet): 200 Feet Assistive device: Rolling walker Ambulation/Gait Assistance Details: Patient starting to ambulate with step through gait pattern this morning Gait Pattern: Step-through pattern    Exercises Total Joint Exercises Quad Sets: AROM;Left;15 reps Heel Slides: Left;15 reps;AROM Hip ABduction/ADduction: Left;15 reps;AROM Straight Leg Raises: Left;15 reps;AROM Long Arc Quad: AAROM;Left   PT Diagnosis:    PT Problem List:   PT Treatment Interventions:     PT Goals Acute Rehab PT Goals PT Goal: Sit to Stand - Progress: Met PT Goal: Ambulate - Progress: Met PT Goal: Perform Home Exercise Program - Progress: Progressing  toward goal  Visit Information  Last PT Received On: 12/03/12 Assistance Needed: +1    Subjective Data      Cognition  Cognition Overall Cognitive Status: Appears within functional limits for tasks assessed/performed Arousal/Alertness: Awake/alert Orientation Level: Oriented X4 / Intact Behavior During Session: [facility] for tasks performed    Balance     End of Session PT - End of Session Equipment Utilized During Treatment: Gait belt Activity Tolerance: Patient tolerated treatment well Patient left: in chair;with call bell/phone within reach Nurse Communication: Mobility status   GP     Fredrich Birks 12/03/2012, 8:35 AM  12/03/2012 Fredrich Birks PTA [phone] pager [phone] office

## 2012-12-03 NOTE — Progress Notes (Signed)
PATIENT ID: Lindsay Frost  MRN: [id number]  DOB/AGE:  [birth year] / 57 y.o.  3 Days Post-Op Procedure(s) (LRB): LEFT TOTAL KNEE ARTHROPLASTY (Left)    PROGRESS NOTE Subjective: Patient is alert, oriented, no Nausea, no Vomiting, yes passing gas, no Bowel Movement. Taking PO well. Denies SOB, Chest or Calf Pain. Using Incentive Spirometer, PAS in place. Ambulating slowly with PT. Patient reports pain as moderate  .    Objective: Vital signs in last 24 hours: Filed Vitals:   12/02/12 2152 12/03/12 0000 12/03/12 0631 12/03/12 0800  BP: 133/60  110/60   Pulse: 96  76   Temp: 98.6 F (37 C)  98.1 F (36.7 C)   TempSrc: Oral  Oral   Resp: 18 18 18 20   SpO2: 96%  97% 97%      Intake/Output from previous day: I/O last 3 completed shifts: In: 1400 [P.O.:1400] Out: -    Intake/Output this shift:     LABORATORY DATA:  Recent Labs  12/01/12 0420 12/02/12 0430 12/03/12 0550  WBC 9.3 7.5 6.3  HGB 9.5* 8.8* 8.7*  HCT 28.8* 26.5* 26.2*  PLT 240 200 210  NA 142  --   --   K 3.9  --   --   CL 109  --   --   CO2 26  --   --   BUN 9  --   --   CREATININE 0.65  --   --   GLUCOSE 106*  --   --   CALCIUM 8.3*  --   --     Examination: Neurologically intact ABD soft Neurovascular intact Sensation intact distally Intact pulses distally Dorsiflexion/Plantar flexion intact Incision: dressing C/D/I}  Assessment:   3 Days Post-Op Procedure(s) (LRB): LEFT TOTAL KNEE ARTHROPLASTY (Left) ADDITIONAL DIAGNOSIS:  Acute Blood Loss Anemia - asymptomatic  Plan: PT/OT WBAT, CPM 5/hrs day until ROM 0-90 degrees, then D/C CPM DVT Prophylaxis:  SCDx72hrs, ASA 325 mg BID x 2 weeks DISCHARGE PLAN: Skilled Nursing Facility/Rehab today DISCHARGE NEEDS: HHPT, HHRN, CPM, Walker and 3-in-1 comode seat     Keano Guggenheim M. 12/03/2012, 9:40 AM

## 2012-12-05 ENCOUNTER — Encounter (HOSPITAL_COMMUNITY): Payer: Self-pay

## 2012-12-05 ENCOUNTER — Emergency Department (HOSPITAL_COMMUNITY)
Admission: EM | Admit: 2012-12-05 | Discharge: 2012-12-05 | Disposition: A | Discharge status: Skilled Nursing Facility | Payer: Medicare Other | Attending: Emergency Medicine | Admitting: Emergency Medicine

## 2012-12-05 DIAGNOSIS — Z8719 Personal history of other diseases of the digestive system: Secondary | ICD-10-CM | POA: Insufficient documentation

## 2012-12-05 DIAGNOSIS — F329 Major depressive disorder, single episode, unspecified: Secondary | ICD-10-CM | POA: Insufficient documentation

## 2012-12-05 DIAGNOSIS — Z7982 Long term (current) use of aspirin: Secondary | ICD-10-CM | POA: Insufficient documentation

## 2012-12-05 DIAGNOSIS — G43909 Migraine, unspecified, not intractable, without status migrainosus: Secondary | ICD-10-CM | POA: Insufficient documentation

## 2012-12-05 DIAGNOSIS — F3289 Other specified depressive episodes: Secondary | ICD-10-CM | POA: Insufficient documentation

## 2012-12-05 DIAGNOSIS — M7989 Other specified soft tissue disorders: Secondary | ICD-10-CM

## 2012-12-05 DIAGNOSIS — Z8739 Personal history of other diseases of the musculoskeletal system and connective tissue: Secondary | ICD-10-CM | POA: Insufficient documentation

## 2012-12-05 DIAGNOSIS — Z96659 Presence of unspecified artificial knee joint: Secondary | ICD-10-CM | POA: Insufficient documentation

## 2012-12-05 DIAGNOSIS — Z79899 Other long term (current) drug therapy: Secondary | ICD-10-CM | POA: Insufficient documentation

## 2012-12-05 DIAGNOSIS — M79609 Pain in unspecified limb: Secondary | ICD-10-CM

## 2012-12-05 DIAGNOSIS — K219 Gastro-esophageal reflux disease without esophagitis: Secondary | ICD-10-CM | POA: Insufficient documentation

## 2012-12-05 MED ORDER — OXYCODONE-ACETAMINOPHEN 5-325 MG PO TABS
2.0000 | ORAL_TABLET | Freq: Once | ORAL | Status: AC
  Administered 2012-12-05: 2 via ORAL
  Filled 2012-12-05: qty 2

## 2012-12-05 NOTE — Progress Notes (Signed)
VASCULAR LAB PRELIMINARY  PRELIMINARY  PRELIMINARY  PRELIMINARY  Left lower extremity venous Doppler completed.    Preliminary report:  There is no DVT or SVT noted in the left lower extremity.  Kimari Lienhard, RVT 12/05/2012, 4:43 PM

## 2012-12-05 NOTE — ED Provider Notes (Signed)
DVT study was negative per technician.  Gavin Pound. Jannifer Fischler, MD 12/05/12 [phone]

## 2012-12-05 NOTE — Discharge Instructions (Signed)
 Your ultrasound was negative for a blood clot on preliminary testing.  Please continue to keep your leg elevated, take your usual medications as previously instructed and follow up with your orthopedist as previously scheduled.  Return if you have other concerns, develop fever, chest pain, or difficulty breathing.

## 2012-12-05 NOTE — ED Provider Notes (Signed)
History     CSN: [id number]  Arrival date & time 12/05/12  1424   First MD Initiated Contact with Patient 12/05/12 1431      Chief Complaint  Patient presents with  . Leg Swelling    (Consider location/radiation/quality/duration/timing/severity/associated sxs/prior treatment) HPI Comments: Patient history of osteoarthritis, status post left total knee replacement performed on 11/30/2012 by Dr. Turner Daniels -- presents with complaints of left lower extremity swelling and dark into the skin that began last night and got worse this morning. She is currently at rehabilitation and was sent to the emergency department for a Doppler ultrasound to rule out DVT. Patient complaint of mild tenderness. No chest pain or shortness of breath. No past history of blood clots. Patient is not currently anticoagulated. She denies weakness, numbness, or tingling. Onset of symptoms gradual. Course is gradually worsening. Nothing makes symptoms better or worse.  The history is provided by the patient and medical records.    Past Medical History  Diagnosis Date  . Headache     migraines  . Arthritis   . History of migraine     last one about 2wks ago-takes Imitrex prn  . Joint pain   . Joint swelling   . Back pain     arthritis  . GERD (gastroesophageal reflux disease)     takes Omeprazole daily  . History of gastric ulcer   . Depression     takes cymbalta daily    Past Surgical History  Procedure Laterality Date  . Replacement total knee Right 106yrs ago  . Esophagogastroduodenoscopy  04/03/2012    Procedure: ESOPHAGOGASTRODUODENOSCOPY (EGD);  Surgeon: Theda Belfast, MD;  Location: [facility];  Service: Endoscopy;  Laterality: N/A;  EGD w/ balloon dilation  . Balloon dilation  04/03/2012    Procedure: BALLOON DILATION;  Surgeon: Theda Belfast, MD;  Location: [facility];  Service: Endoscopy;  Laterality: N/A;  . Diagnostic laparoscopy  66yrs ago  . Colonoscopy    . Total knee arthroplasty Left  11/30/2012    Procedure: LEFT TOTAL KNEE ARTHROPLASTY;  Surgeon: Nestor Lewandowsky, MD;  Location: [facility] OR;  Service: Orthopedics;  Laterality: Left;    No family history on file.  History  Substance Use Topics  . Smoking status: Never Smoker   . Smokeless tobacco: Never Used  . Alcohol Use: No    OB History   Grav Para Term Preterm Abortions TAB SAB Ect Mult Living                  Review of Systems  Constitutional: Negative for fever.  HENT: Negative for sore throat and rhinorrhea.   Eyes: Negative for redness.  Respiratory: Negative for shortness of breath.   Cardiovascular: Positive for leg swelling. Negative for chest pain.  Gastrointestinal: Negative for nausea, vomiting, abdominal pain and diarrhea.  Genitourinary: Negative for dysuria.  Musculoskeletal: Negative for myalgias.  Skin: Negative for rash.  Neurological: Negative for headaches.    Allergies  Review of patient's allergies indicates no known allergies.  Home Medications   Current Outpatient Rx  Name  Route  Sig  Dispense  Refill  . aspirin EC 325 MG EC tablet   Oral   Take 1 tablet (325 mg total) by mouth 2 (two) times daily.   30 tablet   0   . bisacodyl (DULCOLAX) 5 MG EC tablet   Oral   Take 1 tablet (5 mg total) by mouth daily as needed.   30 tablet  0   . celecoxib (CELEBREX) 200 MG capsule   Oral   Take 1 capsule (200 mg total) by mouth every 12 (twelve) hours.   60 capsule   0   . DULoxetine (CYMBALTA) 60 MG capsule   Oral   Take 60 mg by mouth 2 (two) times daily.         . methocarbamol (ROBAXIN) 500 MG tablet   Oral   Take 1 tablet (500 mg total) by mouth every 6 (six) hours as needed.   60 tablet   0   . omeprazole (PRILOSEC) 20 MG capsule   Oral   Take 20 mg by mouth 2 (two) times daily.         Marland Kitchen oxyCODONE-acetaminophen (ROXICET) 5-325 MG per tablet   Oral   Take 1-2 tablets by mouth every 4 (four) hours as needed for pain.   60 tablet   0   . SUMAtriptan  (IMITREX) 50 MG tablet   Oral   Take 50 mg by mouth every 2 (two) hours as needed.           BP 147/81  Pulse 78  Temp(Src) 98.1 F (36.7 C) (Oral)  Resp 20  SpO2 98%  Physical Exam  Nursing note and vitals reviewed. Constitutional: She appears well-developed and well-nourished.  HENT:  Head: Normocephalic and atraumatic.  Eyes: Conjunctivae are normal. Right eye exhibits no discharge. Left eye exhibits no discharge.  Neck: Normal range of motion. Neck supple.  Cardiovascular: Normal rate, regular rhythm and normal heart sounds.   Pulmonary/Chest: Effort normal and breath sounds normal.  Abdominal: Soft. There is no tenderness.  Musculoskeletal: She exhibits edema and tenderness.  2+ nonpitting edema from left mid calf to left foot with mild tenderness to palpation. Compartments are soft. Skin is normal color, temperature. 2+ pedal pulses bilaterally. Surgical wound appears to be healing well without drainage.   Neurological: She is alert.  Skin: Skin is warm and dry.  Psychiatric: She has a normal mood and affect.    ED Course  Procedures (including critical care time)  Labs Reviewed - No data to display No results found.   1. Leg swelling     2:40 PM Patient seen and examined. Doppler US ordered.    Vital signs reviewed and are as follows: Filed Vitals:   12/05/12 1428  BP: 147/81  Pulse: 78  Temp: 98.1 F (36.7 C)  Resp: 20   3:40 PM Handoff to Dr. Oletta Lamas. Patient pending doppler ultrasound.   Plan: d/c to home if negative.     MDM  Pending [place] to r/o DVT. No concern for arterial injury, compartment syndrome.         Renne Crigler, PA-C 12/05/12 1541

## 2012-12-05 NOTE — ED Notes (Signed)
Josh, PA at the bedside  

## 2012-12-05 NOTE — ED Notes (Addendum)
Pt had left knee surgery on Monday and has been at [facility] since discharge. Noticed increase in swelling to left lower leg and ankle since yesterday after doing PT. Denies any SOB or CP. VSS.

## 2012-12-06 NOTE — ED Provider Notes (Signed)
Medical screening examination/treatment/procedure(s) were performed by non-physician practitioner and as supervising physician I was immediately available for consultation/collaboration.   Gwyneth Sprout, MD 12/06/12 [phone]

## 2012-12-08 ENCOUNTER — Non-Acute Institutional Stay (SKILLED_NURSING_FACILITY): Payer: Medicare Other | Admitting: Internal Medicine

## 2012-12-08 DIAGNOSIS — R059 Cough, unspecified: Secondary | ICD-10-CM | POA: Diagnosis not present

## 2012-12-08 DIAGNOSIS — D62 Acute posthemorrhagic anemia: Secondary | ICD-10-CM

## 2012-12-08 DIAGNOSIS — G43901 Migraine, unspecified, not intractable, with status migrainosus: Secondary | ICD-10-CM

## 2012-12-08 DIAGNOSIS — M1710 Unilateral primary osteoarthritis, unspecified knee: Secondary | ICD-10-CM

## 2012-12-08 DIAGNOSIS — R05 Cough: Secondary | ICD-10-CM | POA: Diagnosis not present

## 2012-12-14 ENCOUNTER — Encounter: Payer: Self-pay | Admitting: Adult Health

## 2012-12-14 ENCOUNTER — Other Ambulatory Visit: Payer: Self-pay | Admitting: *Deleted

## 2012-12-14 ENCOUNTER — Non-Acute Institutional Stay (SKILLED_NURSING_FACILITY): Payer: Medicare Other | Admitting: Adult Health

## 2012-12-14 DIAGNOSIS — F3289 Other specified depressive episodes: Secondary | ICD-10-CM

## 2012-12-14 DIAGNOSIS — F329 Major depressive disorder, single episode, unspecified: Secondary | ICD-10-CM | POA: Diagnosis not present

## 2012-12-14 DIAGNOSIS — IMO0002 Reserved for concepts with insufficient information to code with codable children: Secondary | ICD-10-CM

## 2012-12-14 DIAGNOSIS — K219 Gastro-esophageal reflux disease without esophagitis: Secondary | ICD-10-CM | POA: Insufficient documentation

## 2012-12-14 DIAGNOSIS — M1712 Unilateral primary osteoarthritis, left knee: Secondary | ICD-10-CM

## 2012-12-14 DIAGNOSIS — F418 Other specified anxiety disorders: Secondary | ICD-10-CM | POA: Insufficient documentation

## 2012-12-14 DIAGNOSIS — M62838 Other muscle spasm: Secondary | ICD-10-CM

## 2012-12-14 DIAGNOSIS — G43909 Migraine, unspecified, not intractable, without status migrainosus: Secondary | ICD-10-CM | POA: Insufficient documentation

## 2012-12-14 DIAGNOSIS — M1710 Unilateral primary osteoarthritis, unspecified knee: Secondary | ICD-10-CM

## 2012-12-14 DIAGNOSIS — F32A Depression, unspecified: Secondary | ICD-10-CM | POA: Insufficient documentation

## 2012-12-14 MED ORDER — OXYCODONE-ACETAMINOPHEN 5-325 MG PO TABS: ORAL_TABLET | ORAL | Status: DC

## 2012-12-14 NOTE — Progress Notes (Signed)
  Subjective:    Patient ID: Lindsay Frost, female    DOB: 1956-04-29, 57 y.o.   MRN: [id number]  HPI This is a 57 year old female who is for discharge home with Home health PT and Nursing. DME:  Rolling walker and 3-in-1 commode due to unsteady gait. She has been admitted to [facility] on 12/03/12 from [facility] with Osteoarthritis S/P Left TKA. She has been admitted to [facility] for a short-term rehabilitation.   Review of Systems  Constitutional: Negative.   HENT: Negative.   Eyes: Negative.   Respiratory: Negative for chest tightness, shortness of breath and wheezing.   Cardiovascular: Positive for leg swelling. Negative for chest pain.  Gastrointestinal: Negative.   Endocrine: Negative.   Genitourinary: Negative.   Neurological: Negative.   Hematological: Negative for adenopathy. Does not bruise/bleed easily.  Psychiatric/Behavioral: Negative.        Objective:   Physical Exam  Constitutional: She is oriented to person, place, and time. She appears well-developed and well-nourished.  HENT:  Head: Normocephalic.  Right Ear: External ear normal.  Left Ear: External ear normal.  Eyes: Conjunctivae are normal. Pupils are equal, round, and reactive to light.  Neck: Normal range of motion. Neck supple.  Cardiovascular: Normal rate, regular rhythm and normal heart sounds.   Pulmonary/Chest: Effort normal and breath sounds normal. No respiratory distress.  Abdominal: Soft. Bowel sounds are normal. She exhibits no distension. There is no tenderness.  Musculoskeletal: Normal range of motion. She exhibits edema. She exhibits no tenderness.  LLE edema , 2+  Neurological: She is alert and oriented to person, place, and time.  Skin: Skin is warm and dry.  Psychiatric: She has a normal mood and affect. Her behavior is normal. Judgment and thought content normal.          Assessment & Plan:  Osteoarthritis of left knee S/P Left TKA - for Home health PT and Nursing  follow-up  Depression - stable  GERD (gastroesophageal reflux disease) - stable  Migraine, unspecified, without mention of intractable migraine without mention of status migrainosus - stable  Muscle spasm - stable

## 2012-12-15 DIAGNOSIS — M1710 Unilateral primary osteoarthritis, unspecified knee: Secondary | ICD-10-CM | POA: Diagnosis not present

## 2012-12-16 DIAGNOSIS — Z471 Aftercare following joint replacement surgery: Secondary | ICD-10-CM

## 2012-12-16 DIAGNOSIS — Z96659 Presence of unspecified artificial knee joint: Secondary | ICD-10-CM | POA: Diagnosis not present

## 2012-12-16 DIAGNOSIS — M25569 Pain in unspecified knee: Secondary | ICD-10-CM

## 2012-12-16 DIAGNOSIS — R269 Unspecified abnormalities of gait and mobility: Secondary | ICD-10-CM | POA: Diagnosis not present

## 2012-12-21 NOTE — Progress Notes (Signed)
Patient ID: Lindsay Frost, female   DOB: Nov 13, 1955, 57 y.o.   MRN: [id number]        HISTORY & PHYSICAL  DATE:   12/08/2012  FACILITY:  [facility]   LEVEL OF CARE: SNF   ALLERGIES:  No Known Allergies   CHIEF COMPLAINT:  Manage left knee osteoarthritis, acute blood loss anemia and migraines.    HISTORY OF PRESENT ILLNESS:  57 year-old female was having end-stage left knee osteoarthritis.    KNEE OSTEOARTHRITIS: Patient had a history of pain and functional disability in the knee due to end-stage osteoarthritis and has failed nonsurgical conservative treatments. Patient had worsening of pain with activity and weight bearing, pain that interfered with activities of daily living & pain with passive range of motion. Therefore patient underwent total knee arthroplasty and tolerated the procedure well. Patient is admitted to this facility for sort short-term rehabilitation. Patient denies knee pain.   ANEMIA: Postoperatively, the patient suffered acute blood loss.  The anemia has been stable. The patient denies fatigue, melena or hematochezia. No complications from the medications currently being used.  Last hemoglobins are 8.8 and 9.5.    MIGRAINES: The patient has a history of migraine headaches.  She is on sumatriptan and tolerates it without any problems.  She denies recent migraines.     PAST MEDICAL HISTORY :  Past Medical History  Diagnosis Date  . Headache     migraines  . Arthritis   . History of migraine     last one about 2wks ago-takes Imitrex prn  . Joint pain   . Joint swelling   . Back pain     arthritis  . GERD (gastroesophageal reflux disease)     takes Omeprazole daily  . History of gastric ulcer   . Depression     takes cymbalta daily     PAST SURGICAL HISTORY: Past Surgical History  Procedure Laterality Date  . Replacement total knee Right 78yrs ago  . Esophagogastroduodenoscopy  04/03/2012    Procedure: ESOPHAGOGASTRODUODENOSCOPY (EGD);  Surgeon:  Theda Belfast, MD;  Location: [facility];  Service: Endoscopy;  Laterality: N/A;  EGD w/ balloon dilation  . Balloon dilation  04/03/2012    Procedure: BALLOON DILATION;  Surgeon: Theda Belfast, MD;  Location: [facility];  Service: Endoscopy;  Laterality: N/A;  . Diagnostic laparoscopy  66yrs ago  . Colonoscopy    . Total knee arthroplasty Left 11/30/2012    Procedure: LEFT TOTAL KNEE ARTHROPLASTY;  Surgeon: Nestor Lewandowsky, MD;  Location: [facility] OR;  Service: Orthopedics;  Laterality: Left;   SOCIAL HISTORY:  reports that she has never smoked. She has never used smokeless tobacco. She reports that she does not drink alcohol or use illicit drugs.  FAMILY HISTORY: None  CURRENT MEDICATIONS: Reviewed per [facility]  REVIEW OF SYSTEMS:   CHEST/RESPIRATORY:   Complains of a productive cough with hemoptysis.   See HPI otherwise 14 point ROS is negative.  PHYSICAL EXAMINATION  VS:  T 98       P 62      RR 16       BP 90/60      POX% 94       WT (Lb)  GENERAL: no acute distress, moderately obese body habitus SKIN: warm & dry, no suspicious lesions or rashes, no excessive dryness, left knee exam clean and dry, staples in place EYES: conjunctivae normal, sclerae normal, normal eye lids MOUTH/THROAT: lips without lesions,no lesions in the mouth,tongue is  without lesions,uvula elevates in midline NECK: supple, trachea midline, no neck masses, no thyroid tenderness, no thyromegaly LYMPHATICS: no LAN in the neck, no supraclavicular LAN RESPIRATORY: breathing is even & unlabored, BS CTAB CARDIAC: RRR, no murmur,no extra heart sounds EDEMA/VARICOSITIES:  left lower extremity has +2 edema   ARTERIAL:  Pedal pulse nonpalpable GI:  ABDOMEN: abdomen soft, normal BS, no masses, no tenderness  LIVER/SPLEEN: no hepatomegaly, no splenomegaly MUSCULOSKELETAL: HEAD: normal to inspection & palpation BACK: no kyphosis, scoliosis or spinal processes tenderness EXTREMITIES: LEFT UPPER EXTREMITY: full range of  motion, normal strength & tone RIGHT UPPER EXTREMITY:  full range of motion, normal strength & tone LEFT LOWER EXTREMITY:  Strength intact.  Range of motion not tested due to surgery RIGHT LOWER EXTREMITY:  full range of motion, normal strength & ton PSYCHIATRIC: the patient is alert & oriented to person, affect & behavior appropriate  LABS/RADIOLOGY: Hemoglobin 8.8, otherwise CBC normal.    BMP normal.  Chest x-ray:  No acute disease.   ASSESSMENT/PLAN:  Left knee osteoarthritis.  Status post left total knee arthroplasty.  Continue rehabilitation.   Acute blood loss anemia.  Reassess hemoglobin level.   Migraine headaches.  Continue sumatriptan as needed.     Cough with hemoptysis.  Start Mucinex 600 mg b.i.d. for one week and saline nasal spray 2 sprays to each nostril b.i.d. for 10 days.    GERD.  Well controlled.   V58.69.  Check CBC and BMP.  I have reviewed patient's medical records received from hospitalization.  CPT CODE: 14782.

## 2013-02-15 ENCOUNTER — Other Ambulatory Visit: Payer: Self-pay | Admitting: Adult Health

## 2013-02-17 LAB — HM MAMMOGRAPHY: HM Mammogram: NORMAL

## 2013-02-22 ENCOUNTER — Other Ambulatory Visit: Payer: Self-pay | Admitting: *Deleted

## 2013-03-08 ENCOUNTER — Encounter (HOSPITAL_BASED_OUTPATIENT_CLINIC_OR_DEPARTMENT_OTHER): Payer: Self-pay | Admitting: *Deleted

## 2013-03-08 ENCOUNTER — Emergency Department (HOSPITAL_BASED_OUTPATIENT_CLINIC_OR_DEPARTMENT_OTHER)
Admission: EM | Admit: 2013-03-08 | Discharge: 2013-03-09 | Disposition: A | Discharge status: Home / Self Care | Payer: Medicare Other | Attending: Emergency Medicine | Admitting: Emergency Medicine

## 2013-03-08 DIAGNOSIS — R1084 Generalized abdominal pain: Secondary | ICD-10-CM | POA: Diagnosis not present

## 2013-03-08 DIAGNOSIS — M129 Arthropathy, unspecified: Secondary | ICD-10-CM | POA: Diagnosis not present

## 2013-03-08 DIAGNOSIS — E876 Hypokalemia: Secondary | ICD-10-CM | POA: Diagnosis not present

## 2013-03-08 DIAGNOSIS — K920 Hematemesis: Secondary | ICD-10-CM | POA: Diagnosis not present

## 2013-03-08 DIAGNOSIS — Z9889 Other specified postprocedural states: Secondary | ICD-10-CM | POA: Insufficient documentation

## 2013-03-08 DIAGNOSIS — G43909 Migraine, unspecified, not intractable, without status migrainosus: Secondary | ICD-10-CM | POA: Diagnosis not present

## 2013-03-08 DIAGNOSIS — Z8719 Personal history of other diseases of the digestive system: Secondary | ICD-10-CM | POA: Insufficient documentation

## 2013-03-08 DIAGNOSIS — Z79899 Other long term (current) drug therapy: Secondary | ICD-10-CM | POA: Diagnosis not present

## 2013-03-08 DIAGNOSIS — K219 Gastro-esophageal reflux disease without esophagitis: Secondary | ICD-10-CM | POA: Diagnosis not present

## 2013-03-08 DIAGNOSIS — Z8739 Personal history of other diseases of the musculoskeletal system and connective tissue: Secondary | ICD-10-CM | POA: Insufficient documentation

## 2013-03-08 DIAGNOSIS — F3289 Other specified depressive episodes: Secondary | ICD-10-CM | POA: Diagnosis not present

## 2013-03-08 DIAGNOSIS — F329 Major depressive disorder, single episode, unspecified: Secondary | ICD-10-CM | POA: Insufficient documentation

## 2013-03-08 LAB — CBC
HCT: 38.7 % (ref 36.0–46.0)
Hemoglobin: 12.6 g/dL (ref 12.0–15.0)
MCH: 28.1 pg (ref 26.0–34.0)
MCHC: 32.6 g/dL (ref 30.0–36.0)
MCV: 86.2 fL (ref 78.0–100.0)
Platelets: 291 10*3/uL (ref 150–400)
RBC: 4.49 MIL/uL (ref 3.87–5.11)
RDW: 14.2 % (ref 11.5–15.5)
WBC: 6.1 10*3/uL (ref 4.0–10.5)

## 2013-03-08 LAB — COMPREHENSIVE METABOLIC PANEL
ALT: 10 U/L (ref 0–35)
AST: 15 U/L (ref 0–37)
Albumin: 3.7 g/dL (ref 3.5–5.2)
Alkaline Phosphatase: 73 U/L (ref 39–117)
BUN: 12 mg/dL (ref 6–23)
CO2: 25 mEq/L (ref 19–32)
Calcium: 9.1 mg/dL (ref 8.4–10.5)
Chloride: 105 mEq/L (ref 96–112)
Creatinine, Ser: 0.7 mg/dL (ref 0.50–1.10)
GFR calc Af Amer: >90 mL/min (ref >90)
GFR calc non Af Amer: >90 mL/min (ref >90)
Glucose, Bld: 92 mg/dL (ref 70–99)
Potassium: 2.9 mEq/L — ABNORMAL LOW (ref 3.5–5.1)
Sodium: 142 mEq/L (ref 135–145)
Total Bilirubin: 0.2 mg/dL — ABNORMAL LOW (ref 0.3–1.2)
Total Protein: 6.9 g/dL (ref 6.0–8.3)

## 2013-03-08 LAB — LIPASE, BLOOD: Lipase: 23 U/L (ref 11–59)

## 2013-03-08 LAB — OCCULT BLOOD X 1 CARD TO LAB, STOOL: Fecal Occult Bld: NEGATIVE

## 2013-03-08 MED ORDER — POTASSIUM CHLORIDE CRYS ER 20 MEQ PO TBCR
40.0000 meq | EXTENDED_RELEASE_TABLET | Freq: Once | ORAL | Status: AC
  Administered 2013-03-08: 40 meq via ORAL
  Filled 2013-03-08: qty 2

## 2013-03-08 MED ORDER — POTASSIUM CHLORIDE ER 10 MEQ PO TBCR: 10.0000 meq | EXTENDED_RELEASE_TABLET | Freq: Two times a day (BID) | ORAL | Status: DC

## 2013-03-08 NOTE — ED Notes (Signed)
Pt states she vomited blood x 1 today after eating, abd pain before she vomited. Pt states she has also had diarrhea x 3 days which was dark in color.

## 2013-03-08 NOTE — ED Provider Notes (Signed)
History    CSN: [id number] Arrival date & time 03/08/13  2150  First MD Initiated Contact with Patient 03/08/13 2229     Chief Complaint  Patient presents with  . Hematemesis   (Consider location/radiation/quality/duration/timing/severity/associated sxs/prior Treatment) HPI Comments: Pt states that she had blood in her vomit after one vomit today:pt states that she had a similar episode a couple of days ago:pt states that she has had some generalized abdominal pain:pt states that she has also had diarrhea times 3 days which is dark in color:denies fever  The history is provided by the patient. No language interpreter was used.   Past Medical History  Diagnosis Date  . Headache(784.0)     migraines  . Arthritis   . History of migraine     last one about 2wks ago-takes Imitrex prn  . Joint pain   . Joint swelling   . Back pain     arthritis  . GERD (gastroesophageal reflux disease)     takes Omeprazole daily  . History of gastric ulcer   . Depression     takes cymbalta daily   Past Surgical History  Procedure Laterality Date  . Replacement total knee Right 39yrs ago  . Esophagogastroduodenoscopy  04/03/2012    Procedure: ESOPHAGOGASTRODUODENOSCOPY (EGD);  Surgeon: Theda Belfast, MD;  Location: [facility];  Service: Endoscopy;  Laterality: N/A;  EGD w/ balloon dilation  . Balloon dilation  04/03/2012    Procedure: BALLOON DILATION;  Surgeon: Theda Belfast, MD;  Location: [facility];  Service: Endoscopy;  Laterality: N/A;  . Diagnostic laparoscopy  51yrs ago  . Colonoscopy    . Total knee arthroplasty Left 11/30/2012    Procedure: LEFT TOTAL KNEE ARTHROPLASTY;  Surgeon: Nestor Lewandowsky, MD;  Location: [facility] OR;  Service: Orthopedics;  Laterality: Left;   History reviewed. No pertinent family history. History  Substance Use Topics  . Smoking status: Never Smoker   . Smokeless tobacco: Never Used  . Alcohol Use: No   OB History   Grav Para Term Preterm Abortions TAB SAB  Ect Mult Living                 Review of Systems  Constitutional: Negative.   Respiratory: Negative.   Cardiovascular: Negative.     Allergies  Review of patient's allergies indicates no known allergies.  Home Medications   Current Outpatient Rx  Name  Route  Sig  Dispense  Refill  . methocarbamol (ROBAXIN) 500 MG tablet   Oral   Take 500 mg by mouth every 6 (six) hours as needed (for muscle spasms).         Marland Kitchen omeprazole (PRILOSEC) 20 MG capsule   Oral   Take 20 mg by mouth 2 (two) times daily.         Marland Kitchen oxyCODONE-acetaminophen (PERCOCET/ROXICET) 5-325 MG per tablet      Take one tablet by mouth every four hours routine; Take one tablet by mouth every four hours as needed for pain.   360 tablet   0   . SUMAtriptan (IMITREX) 50 MG tablet   Oral   Take 50 mg by mouth every 2 (two) hours as needed for migraine.          Marland Kitchen aspirin EC 325 MG EC tablet   Oral   Take 1 tablet (325 mg total) by mouth 2 (two) times daily.   30 tablet   0   . bisacodyl (DULCOLAX) 5 MG EC tablet  Oral   Take 1 tablet (5 mg total) by mouth daily as needed.   30 tablet   0   . DULoxetine (CYMBALTA) 60 MG capsule   Oral   Take 60 mg by mouth 2 (two) times daily.          BP 148/75  Pulse 78  Temp(Src) 97.9 F (36.6 C) (Oral)  Resp 20  Ht 5\' 4"  (1.626 m)  Wt 185 lb (83.915 kg)  BMI 31.74 kg/m2  SpO2 99% Physical Exam  Nursing note and vitals reviewed. Constitutional: She is oriented to person, place, and time. She appears well-developed and well-nourished.  HENT:  Head: Normocephalic and atraumatic.  Eyes: Conjunctivae are normal. Pupils are equal, round, and reactive to light.  Neck: Normal range of motion. Neck supple.  Cardiovascular: Normal rate and regular rhythm.   Pulmonary/Chest: Effort normal and breath sounds normal.  Abdominal: Soft. Bowel sounds are normal.  Genitourinary:  Stool normal color  Musculoskeletal: Normal range of motion.  Neurological: She  is alert and oriented to person, place, and time.  Skin: Skin is warm and dry.    ED Course  Procedures (including critical care time) Labs Reviewed  COMPREHENSIVE METABOLIC PANEL - Abnormal; Notable for the following:    Potassium 2.9 (*)    Total Bilirubin 0.2 (*)    All other components within normal limits  CBC  LIPASE, BLOOD  OCCULT BLOOD X 1 CARD TO LAB, STOOL   No results found. 1. Hypokalemia   2. Hematemesis     MDM  Pt treated for low potassium:vitals stable:hgb within normal limits  Teressa Lower, NP 03/09/13 0001

## 2013-03-11 NOTE — ED Provider Notes (Signed)
Medical screening examination/treatment/procedure(s) were performed by non-physician practitioner and as supervising physician I was immediately available for consultation/collaboration.   Rolan Bucco, MD 03/11/13 [phone]

## 2013-04-28 DIAGNOSIS — Z0000 Encounter for general adult medical examination without abnormal findings: Secondary | ICD-10-CM | POA: Diagnosis not present

## 2013-05-06 DIAGNOSIS — M1710 Unilateral primary osteoarthritis, unspecified knee: Secondary | ICD-10-CM | POA: Diagnosis not present

## 2013-05-27 DIAGNOSIS — Z1231 Encounter for screening mammogram for malignant neoplasm of breast: Secondary | ICD-10-CM | POA: Diagnosis not present

## 2013-05-27 DIAGNOSIS — Z803 Family history of malignant neoplasm of breast: Secondary | ICD-10-CM | POA: Diagnosis not present

## 2013-05-28 DIAGNOSIS — Z0000 Encounter for general adult medical examination without abnormal findings: Secondary | ICD-10-CM | POA: Diagnosis not present

## 2013-07-22 LAB — HM PAP SMEAR: HM Pap smear: NORMAL

## 2013-08-05 ENCOUNTER — Emergency Department (HOSPITAL_COMMUNITY)
Admission: EM | Admit: 2013-08-05 | Discharge: 2013-08-05 | Disposition: A | Discharge status: Home / Self Care | Payer: Medicare Other | Attending: Emergency Medicine | Admitting: Emergency Medicine

## 2013-08-05 ENCOUNTER — Emergency Department (HOSPITAL_COMMUNITY): Payer: Medicare Other

## 2013-08-05 ENCOUNTER — Encounter (HOSPITAL_COMMUNITY): Payer: Self-pay | Admitting: Emergency Medicine

## 2013-08-05 DIAGNOSIS — Y929 Unspecified place or not applicable: Secondary | ICD-10-CM | POA: Insufficient documentation

## 2013-08-05 DIAGNOSIS — Z96659 Presence of unspecified artificial knee joint: Secondary | ICD-10-CM | POA: Insufficient documentation

## 2013-08-05 DIAGNOSIS — D320 Benign neoplasm of cerebral meninges: Secondary | ICD-10-CM | POA: Insufficient documentation

## 2013-08-05 DIAGNOSIS — D329 Benign neoplasm of meninges, unspecified: Secondary | ICD-10-CM

## 2013-08-05 DIAGNOSIS — W1809XA Striking against other object with subsequent fall, initial encounter: Secondary | ICD-10-CM | POA: Insufficient documentation

## 2013-08-05 DIAGNOSIS — S8990XA Unspecified injury of unspecified lower leg, initial encounter: Secondary | ICD-10-CM | POA: Insufficient documentation

## 2013-08-05 DIAGNOSIS — W010XXA Fall on same level from slipping, tripping and stumbling without subsequent striking against object, initial encounter: Secondary | ICD-10-CM | POA: Insufficient documentation

## 2013-08-05 DIAGNOSIS — Y9301 Activity, walking, marching and hiking: Secondary | ICD-10-CM | POA: Insufficient documentation

## 2013-08-05 DIAGNOSIS — S0990XA Unspecified injury of head, initial encounter: Secondary | ICD-10-CM | POA: Insufficient documentation

## 2013-08-05 DIAGNOSIS — M25569 Pain in unspecified knee: Secondary | ICD-10-CM | POA: Diagnosis not present

## 2013-08-05 DIAGNOSIS — G43909 Migraine, unspecified, not intractable, without status migrainosus: Secondary | ICD-10-CM | POA: Insufficient documentation

## 2013-08-05 DIAGNOSIS — Z9889 Other specified postprocedural states: Secondary | ICD-10-CM | POA: Insufficient documentation

## 2013-08-05 DIAGNOSIS — F329 Major depressive disorder, single episode, unspecified: Secondary | ICD-10-CM | POA: Insufficient documentation

## 2013-08-05 DIAGNOSIS — K219 Gastro-esophageal reflux disease without esophagitis: Secondary | ICD-10-CM | POA: Insufficient documentation

## 2013-08-05 DIAGNOSIS — Z23 Encounter for immunization: Secondary | ICD-10-CM | POA: Insufficient documentation

## 2013-08-05 DIAGNOSIS — R011 Cardiac murmur, unspecified: Secondary | ICD-10-CM | POA: Insufficient documentation

## 2013-08-05 DIAGNOSIS — M129 Arthropathy, unspecified: Secondary | ICD-10-CM | POA: Insufficient documentation

## 2013-08-05 DIAGNOSIS — IMO0002 Reserved for concepts with insufficient information to code with codable children: Secondary | ICD-10-CM | POA: Insufficient documentation

## 2013-08-05 DIAGNOSIS — F3289 Other specified depressive episodes: Secondary | ICD-10-CM | POA: Insufficient documentation

## 2013-08-05 DIAGNOSIS — W19XXXA Unspecified fall, initial encounter: Secondary | ICD-10-CM

## 2013-08-05 DIAGNOSIS — Z79899 Other long term (current) drug therapy: Secondary | ICD-10-CM | POA: Insufficient documentation

## 2013-08-05 DIAGNOSIS — M25562 Pain in left knee: Secondary | ICD-10-CM

## 2013-08-05 DIAGNOSIS — Z8711 Personal history of peptic ulcer disease: Secondary | ICD-10-CM | POA: Insufficient documentation

## 2013-08-05 MED ORDER — TETANUS-DIPHTH-ACELL PERTUSSIS 5-2.5-18.5 LF-MCG/0.5 IM SUSP
0.5000 mL | Freq: Once | INTRAMUSCULAR | Status: AC
  Administered 2013-08-05: 0.5 mL via INTRAMUSCULAR
  Filled 2013-08-05: qty 0.5

## 2013-08-05 MED ORDER — ACETAMINOPHEN 325 MG PO TABS
650.0000 mg | ORAL_TABLET | Freq: Once | ORAL | Status: AC
  Administered 2013-08-05: 650 mg via ORAL
  Filled 2013-08-05: qty 2

## 2013-08-05 NOTE — ED Notes (Signed)
Patient reports that she was out walking the dog in the dark and tripped on something causing her to fall and hit her head. Patient denies having LOC. Patient also c/o left knee pain and iced the left knee. Patient has had surgery to the left knee.

## 2013-08-05 NOTE — ED Provider Notes (Signed)
CSN: [id number]     Arrival date & time 08/05/13  1742 History   First MD Initiated Contact with Patient 08/05/13 1801     Chief Complaint  Patient presents with  . Fall  . Head Injury   HPI  Lindsay Frost is a 57 y.o. female with a PMH of migraine headaches, arthritis, GERD, gastric ulcer, and depression who presents to the ED for evaluation of fall and head injury.  History was provided by the patient.  Patient states that last night she was outside walking her dog when she tripped and fell forward and hit her forehead. She states that she used her hands to help her prevent her fall. She denies any loss of consciousness.  She also states that she fell on her left knee. She had swelling to her left knee last night which has improved after applying ice. Patient has a history of a knee replacement. She has a small abrasion to the right forehead. She is unsure when her last tetanus administration was.  She states that she has a frontal headache on the right side of her head.  She has a history of migraines which are not similar to her headaches today. She describes a throbbing constant pain. She denies any vision changes, back pain, or neck pain. She's been able to ambulate without difficulty.  Patient is not on any anticoagulation. She denies any confusion, slurred speech, weakness, loss of sensation, or numbness or tingling. She denies any chest pain, shortness of breath, abdominal pain, nausea, vomiting, or other concerns.   Past Medical History  Diagnosis Date  . Headache(784.0)     migraines  . Arthritis   . History of migraine     last one about 2wks ago-takes Imitrex prn  . Joint pain   . Joint swelling   . Back pain     arthritis  . GERD (gastroesophageal reflux disease)     takes Omeprazole daily  . History of gastric ulcer   . Depression     takes cymbalta daily   Past Surgical History  Procedure Laterality Date  . Replacement total knee Right 29yrs ago  .  Esophagogastroduodenoscopy  04/03/2012    Procedure: ESOPHAGOGASTRODUODENOSCOPY (EGD);  Surgeon: Theda Belfast, MD;  Location: [facility];  Service: Endoscopy;  Laterality: N/A;  EGD w/ balloon dilation  . Balloon dilation  04/03/2012    Procedure: BALLOON DILATION;  Surgeon: Theda Belfast, MD;  Location: [facility];  Service: Endoscopy;  Laterality: N/A;  . Diagnostic laparoscopy  57yrs ago  . Colonoscopy    . Total knee arthroplasty Left 11/30/2012    Procedure: LEFT TOTAL KNEE ARTHROPLASTY;  Surgeon: Nestor Lewandowsky, MD;  Location: [facility] OR;  Service: Orthopedics;  Laterality: Left;   History reviewed. No pertinent family history. History  Substance Use Topics  . Smoking status: Never Smoker   . Smokeless tobacco: Never Used  . Alcohol Use: No   OB History   Grav Para Term Preterm Abortions TAB SAB Ect Mult Living                 Review of Systems  Constitutional: Negative for fever, chills, diaphoresis, activity change, appetite change and fatigue.  HENT: Negative for congestion, rhinorrhea, sore throat and trouble swallowing.   Eyes: Negative for photophobia and visual disturbance.  Respiratory: Negative for cough and shortness of breath.   Cardiovascular: Negative for chest pain.  Gastrointestinal: Negative for nausea, vomiting, abdominal pain and diarrhea.  Genitourinary:  Negative for dysuria and hematuria.  Musculoskeletal: Positive for arthralgias (left knee) and joint swelling (resolved). Negative for back pain, myalgias, neck pain and neck stiffness.  Skin: Positive for wound. Negative for color change and rash.  Neurological: Positive for headaches. Negative for dizziness, syncope, facial asymmetry, speech difficulty, weakness, light-headedness and numbness.    Allergies  Review of patient's allergies indicates no known allergies.  Home Medications   Current Outpatient Rx  Name  Route  Sig  Dispense  Refill  . DULoxetine (CYMBALTA) 60 MG capsule   Oral   Take 60 mg  by mouth 2 (two) times daily.         Marland Kitchen ibuprofen (ADVIL,MOTRIN) 200 MG tablet   Oral   Take 800 mg by mouth every 6 (six) hours as needed.         Marland Kitchen omeprazole (PRILOSEC) 20 MG capsule   Oral   Take 20 mg by mouth 2 (two) times daily.         . potassium chloride (K-DUR) 10 MEQ tablet   Oral   Take 1 tablet (10 mEq total) by mouth 2 (two) times daily.   10 tablet   0    BP 154/94  Pulse 80  Temp(Src) 98.3 F (36.8 C) (Oral)  Resp 16  SpO2 97%  Filed Vitals:   08/05/13 1750 08/05/13 2014  BP: 154/94 160/82  Pulse: 80 70  Temp: 98.3 F (36.8 C)   TempSrc: Oral   Resp: 16 16  SpO2: 97% 99%    Physical Exam  Nursing note and vitals reviewed. Constitutional: She is oriented to person, place, and time. She appears well-developed and well-nourished. No distress.  HENT:  Head: Normocephalic.    Right Ear: External ear normal.  Left Ear: External ear normal.  Nose: Nose normal.  Mouth/Throat: Oropharynx is clear and moist. No oropharyngeal exudate.  Superficial 1 cm x 2 cm abrasion to the right forehead with no open wounds or surrounding edema, erythema, or ecchymosis.  Tenderness to palpation to the right forehead and parietal scalp throughout. No palpable hematoma or step-off. Tympanic membranes gray and translucent bilaterally.  Eyes: Conjunctivae and EOM are normal. Pupils are equal, round, and reactive to light. Right eye exhibits no discharge. Left eye exhibits no discharge.  Neck: Normal range of motion. Neck supple.  No cervical spinal or paraspinal tenderness.  Cardiovascular: Normal rate, regular rhythm and intact distal pulses.  Exam reveals no gallop and no friction rub.   Murmur heard. Radial and dorsalis pedis pulses present bilaterally  Pulmonary/Chest: Effort normal and breath sounds normal. No respiratory distress. She has no wheezes. She has no rales. She exhibits no tenderness.  Abdominal: Soft. Bowel sounds are normal. She exhibits no distension  and no mass. There is no tenderness. There is no rebound and no guarding.  Musculoskeletal: Normal range of motion. She exhibits tenderness. She exhibits no edema.       Legs: Tenderness to palpation to the left inferior joint lines of the left knee. No joint effusion to the left knee. Patient able to flex and extend left knee without difficulty or limitations.  No hip or ankle tenderness bilaterally. No tenderness to the upper extremities throughout. No thoracic or lumbar spinal tenderness. Strength 5 out of 5 in the upper and lower extremities. Patient able to ambulate without difficulty or ataxia. No calf tenderness or edema bilaterally.  Neurological: She is alert and oriented to person, place, and time. No cranial nerve deficit.  GCS  15. No focal neurological deficits. Cranial nerves II through XII intact.  Gross sensation intact.  Skin: Skin is warm and dry. She is not diaphoretic.  Surgical scar present on the left kneecap with no overlying wounds, erythema, edema, ecchymosis.    ED Course  Procedures (including critical care time) Labs Review Labs Reviewed - No data to display Imaging Review No results found.  EKG Interpretation   None       CT Head Wo Contrast (Final result)  Result time: 08/05/13 19:11:37    Final result by Rad Results In Interface (08/05/13 19:11:37)    Narrative:   CLINICAL DATA: Fall.  EXAM: CT HEAD WITHOUT CONTRAST  TECHNIQUE: Contiguous axial images were obtained from the base of the skull through the vertex without intravenous contrast.  COMPARISON: 12/04/2003  FINDINGS: The brainstem, cerebellum, cerebral peduncles, thalamus, basal ganglia, basilar cisterns, and ventricular system appear within normal limits. Left mastoid effusion noted. Small left parietal vertex densely calcified meningioma, 1.2 x 0.8 cm, without adjacent vasogenic edema or significant mass effect on the brain. No intracranial hemorrhage or acute CVA.  IMPRESSION: 1.  No acute intracranial findings. 2. Chronic left mastoid effusion. 3. Small chronic left parietal vertex meningioma without complicating feature.   Electronically Signed By: Herbie Baltimore M.D. On: 08/05/2013 19:11             DG Knee Complete 4 Views Left (Final result)  Result time: 08/05/13 19:05:10    Final result by Rad Results In Interface (08/05/13 19:05:10)    Narrative:   CLINICAL DATA: Fall. Left knee pain.  EXAM: LEFT KNEE - COMPLETE 4+ VIEW  COMPARISON: 12/18/2004  FINDINGS: Left total knee prosthesis in place without fracture or complicating feature observed.  Underlying bony demineralization noted.  IMPRESSION: 1. No acute bony findings are observed. Left total knee prosthesis is in place.   Electronically Signed By: Herbie Baltimore M.D. On: 08/05/2013 19:05            MDM   Lindsay Frost is a 57 y.o. female with a PMH of migraine headaches, arthritis, GERD, gastric ulcer, and depression who presents to the ED for evaluation of fall and head injury.  CT head and left knee ordered.  Tylenol ordered for pain.  Tdap ordered.     Rechecks  7:50 PM = Patient asking for something for her headache.  Tylenol ordered.   8:40 PM = Patient able to ambulate without difficulty or ataxia.  Dressed and ready for discharge.      Patient evaluated in emergency department for a fall, which occurred yesterday.  Fall is described as a mechanical fall. Patient complained of a headache and left knee pain. Her head CT was negative for an acute intracranial process. She was found to have a chronic left mastoid effusion and left parietal meningioma. Both of these results were communicated to the patient. Patient was given referral to neurosurgery and instructed to make an appointment as soon as possible. Patient had no neurological deficits on exam. She had improvement in her pain throughout her ED visit. Left knee x-rays were negative for dislocation or fracture.  Patient was neurovascularly intact. Patient was instructed to followup with her orthopedic physician if she continues to have pain or concerns. Patient was given return precautions and instructed to return if she has any concerns. Patient was in agreement with discharge and plan.    New Prescriptions   No medications on file     Final impressions: 1. Fall, initial  encounter   2. Knee pain, left   3. Head injury, initial encounter       Luiz Iron PA-C        Jillyn Ledger, PA-C 08/06/13 [phone]

## 2013-08-06 NOTE — ED Provider Notes (Signed)
Medical screening examination/treatment/procedure(s) were performed by non-physician practitioner and as supervising physician I was immediately available for consultation/collaboration.  EKG Interpretation   None        Shon Baton, MD 08/06/13 [phone]

## 2013-08-10 ENCOUNTER — Other Ambulatory Visit: Payer: Self-pay | Admitting: Neurosurgery

## 2013-08-10 DIAGNOSIS — D320 Benign neoplasm of cerebral meninges: Secondary | ICD-10-CM

## 2013-08-18 ENCOUNTER — Ambulatory Visit
Admission: RE | Admit: 2013-08-18 | Discharge: 2013-08-18 | Disposition: A | Discharge status: Home / Self Care | Payer: Medicare Other | Source: Ambulatory Visit | Attending: Neurosurgery | Admitting: Neurosurgery

## 2013-08-18 DIAGNOSIS — D320 Benign neoplasm of cerebral meninges: Secondary | ICD-10-CM

## 2013-08-18 DIAGNOSIS — S0990XA Unspecified injury of head, initial encounter: Secondary | ICD-10-CM | POA: Diagnosis not present

## 2013-08-18 MED ORDER — GADOBENATE DIMEGLUMINE 529 MG/ML IV SOLN
20.0000 mL | Freq: Once | INTRAVENOUS | Status: AC | PRN
  Administered 2013-08-18: 20 mL via INTRAVENOUS

## 2013-08-22 ENCOUNTER — Other Ambulatory Visit: Payer: Medicare Other

## 2013-09-27 DIAGNOSIS — J019 Acute sinusitis, unspecified: Secondary | ICD-10-CM | POA: Diagnosis not present

## 2013-11-01 DIAGNOSIS — D1802 Hemangioma of intracranial structures: Secondary | ICD-10-CM | POA: Diagnosis not present

## 2013-12-07 DIAGNOSIS — M25569 Pain in unspecified knee: Secondary | ICD-10-CM | POA: Diagnosis not present

## 2014-01-10 DIAGNOSIS — R51 Headache: Secondary | ICD-10-CM | POA: Diagnosis not present

## 2014-01-10 DIAGNOSIS — D320 Benign neoplasm of cerebral meninges: Secondary | ICD-10-CM | POA: Diagnosis not present

## 2014-01-31 ENCOUNTER — Encounter (HOSPITAL_COMMUNITY): Payer: Self-pay | Admitting: Pharmacy Technician

## 2014-01-31 ENCOUNTER — Other Ambulatory Visit: Payer: Self-pay | Admitting: Gastroenterology

## 2014-01-31 DIAGNOSIS — R1013 Epigastric pain: Secondary | ICD-10-CM | POA: Diagnosis not present

## 2014-01-31 DIAGNOSIS — K259 Gastric ulcer, unspecified as acute or chronic, without hemorrhage or perforation: Secondary | ICD-10-CM | POA: Diagnosis not present

## 2014-01-31 DIAGNOSIS — K315 Obstruction of duodenum: Secondary | ICD-10-CM | POA: Diagnosis not present

## 2014-02-02 ENCOUNTER — Encounter (HOSPITAL_COMMUNITY): Payer: Self-pay | Admitting: *Deleted

## 2014-02-16 ENCOUNTER — Telehealth: Payer: Self-pay

## 2014-02-16 NOTE — Telephone Encounter (Signed)
Left message for call back Non-identifiable   New patient Previous PCP: Dr. Hassell Done Pap- DUE [facility]- DUE [facility]- 10/02/10- negative---DUE [facility]- 08/05/13

## 2014-02-17 ENCOUNTER — Ambulatory Visit (INDEPENDENT_AMBULATORY_CARE_PROVIDER_SITE_OTHER): Payer: Medicare Other | Admitting: Family Medicine

## 2014-02-17 ENCOUNTER — Encounter: Payer: Self-pay | Admitting: Family Medicine

## 2014-02-17 ENCOUNTER — Other Ambulatory Visit: Payer: Self-pay | Admitting: Family Medicine

## 2014-02-17 VITALS — BP 140/84 | HR 89 | Temp 98.2°F | Resp 16 | Ht 63.0 in | Wt 193.0 lb

## 2014-02-17 DIAGNOSIS — K219 Gastro-esophageal reflux disease without esophagitis: Secondary | ICD-10-CM

## 2014-02-17 DIAGNOSIS — F3289 Other specified depressive episodes: Secondary | ICD-10-CM

## 2014-02-17 DIAGNOSIS — F329 Major depressive disorder, single episode, unspecified: Secondary | ICD-10-CM | POA: Diagnosis not present

## 2014-02-17 DIAGNOSIS — G43909 Migraine, unspecified, not intractable, without status migrainosus: Secondary | ICD-10-CM | POA: Diagnosis not present

## 2014-02-17 DIAGNOSIS — R350 Frequency of micturition: Secondary | ICD-10-CM

## 2014-02-17 DIAGNOSIS — F32A Depression, unspecified: Secondary | ICD-10-CM

## 2014-02-17 DIAGNOSIS — R109 Unspecified abdominal pain: Secondary | ICD-10-CM | POA: Diagnosis not present

## 2014-02-17 DIAGNOSIS — R82998 Other abnormal findings in urine: Secondary | ICD-10-CM

## 2014-02-17 LAB — POCT URINALYSIS DIPSTICK
Bilirubin, UA: NEGATIVE
Blood, UA: NEGATIVE
Glucose, UA: NEGATIVE
Ketones, UA: NEGATIVE
Nitrite, UA: NEGATIVE
Protein, UA: 100
Spec Grav, UA: >=1.03
Urobilinogen, UA: 0.2
pH, UA: 5

## 2014-02-17 MED ORDER — RIZATRIPTAN BENZOATE 10 MG PO TABS: 10.0000 mg | ORAL_TABLET | ORAL | Status: DC | PRN

## 2014-02-17 MED ORDER — BUPROPION HCL ER (XL) 150 MG PO TB24: 150.0000 mg | ORAL_TABLET | Freq: Every day | ORAL | Status: DC

## 2014-02-17 NOTE — Telephone Encounter (Signed)
Med filled for #90 per pt request. We had originally sent in only #30 to make sure it works.

## 2014-02-17 NOTE — Assessment & Plan Note (Signed)
New to provider, ongoing for pt.  Not currently on meds.  Has endoscopy scheduled for tomorrow w/ Dr Benson Norway.  Will follow.

## 2014-02-17 NOTE — Assessment & Plan Note (Signed)
New to provider, ongoing for pt.  sxs are not well controlled on current dose of Cymbalta.  Will add Wellbutrin and monitor for improvement.  If no sxs improvement, will need to see psych to discuss medication options.

## 2014-02-17 NOTE — Patient Instructions (Signed)
Follow up in 1 month to recheck mood Continue the Cymbalta ADD the Welbutrin daily for the depression Use the Maxalt as needed for migraine We'll call you with your neuro appt for the migraines We'll notify you of your urine culture Call with any questions or concerns Hang in there!! Welcome!  We're glad to have you!

## 2014-02-17 NOTE — Progress Notes (Signed)
Pre visit review using our clinic review tool, if applicable. No additional management support is needed unless otherwise documented below in the visit note. 

## 2014-02-17 NOTE — Assessment & Plan Note (Signed)
New to provider, ongoing for pt.  No relief w/ Imitrex- will try Maxalt.  Refer to neurology for evaluation and tx as pt reports this is the biggest problem she currently faces.

## 2014-02-17 NOTE — Progress Notes (Signed)
   Subjective:    Patient ID: Lindsay Frost, female    DOB: June 09, 1956, 58 y.o.   MRN: [id number]  HPI New to establish.  Previous MD- Dr Criss Rosales  GI- [facility]  Neurosurg- Dr Sherwood Gambler  GERD- pt has endoscopy scheduled for tomorrow due to recurrent sxs.  Not currently on medication- previously on omeprazole.  Depression- chronic problem, currently on Cymbalta.  'it's keeping me from crying and totally breaking down but that's about all it's doing'.  Not currently in counseling.  Has seen psych 'years ago' at [facility].  Pt left previous office b/c she was not getting any help w/ her sxs.    Migraine- 'horrible'.  Was put on Imitrex previously and 'it did not help much'.  Was previously on Topamax but it did not improve HAs- had hair loss.  Currently has meningioma- L coronal, following w/ Dr Sherwood Gambler, 'watchful waiting'.    Lower back pain- no dysuria, + frequency, no urgency.  No blood in urine.  No fevers.   Review of Systems For ROS see HPI     Objective:   Physical Exam  Vitals reviewed. Constitutional: She is oriented to person, place, and time. She appears well-developed and well-nourished. No distress.  HENT:  Head: Normocephalic and atraumatic.  TMs WNL No TTP over sinuses Minimal nasal congestion  Eyes: Conjunctivae and EOM are normal. Pupils are equal, round, and reactive to light.  Neck: Normal range of motion. Neck supple.  Cardiovascular: Normal rate, regular rhythm, normal heart sounds and intact distal pulses.   Pulmonary/Chest: Effort normal and breath sounds normal. No respiratory distress. She has no wheezes. She has no rales.  Abdominal: Soft. Bowel sounds are normal. She exhibits no distension. There is no tenderness. There is no rebound.  Lymphadenopathy:    She has no cervical adenopathy.  Neurological: She is alert and oriented to person, place, and time. She has normal reflexes. No cranial nerve deficit. Coordination normal.  Psychiatric: Her behavior is  normal. Judgment and thought content normal.  Flat affect- withdrawn          Assessment & Plan:

## 2014-02-17 NOTE — Assessment & Plan Note (Signed)
New.  UA is not overwhelming for infxn so rather than start abx, will wait for urine cx.  Reviewed supportive care and red flags that should prompt return.  Pt expressed understanding and is in agreement w/ plan.

## 2014-02-18 ENCOUNTER — Encounter (HOSPITAL_COMMUNITY)
Admission: RE | Disposition: A | Discharge status: Home / Self Care | Payer: Self-pay | Source: Ambulatory Visit | Attending: Gastroenterology

## 2014-02-18 ENCOUNTER — Ambulatory Visit (HOSPITAL_COMMUNITY)
Admission: RE | Admit: 2014-02-18 | Discharge: 2014-02-18 | Disposition: A | Discharge status: Home / Self Care | Payer: Medicare Other | Source: Ambulatory Visit | Attending: Gastroenterology | Admitting: Gastroenterology

## 2014-02-18 ENCOUNTER — Ambulatory Visit (HOSPITAL_COMMUNITY): Payer: Medicare Other | Admitting: Anesthesiology

## 2014-02-18 ENCOUNTER — Encounter (HOSPITAL_COMMUNITY): Payer: Self-pay | Admitting: *Deleted

## 2014-02-18 ENCOUNTER — Encounter (HOSPITAL_COMMUNITY): Payer: Medicare Other | Admitting: Anesthesiology

## 2014-02-18 DIAGNOSIS — K219 Gastro-esophageal reflux disease without esophagitis: Secondary | ICD-10-CM | POA: Insufficient documentation

## 2014-02-18 DIAGNOSIS — K259 Gastric ulcer, unspecified as acute or chronic, without hemorrhage or perforation: Secondary | ICD-10-CM | POA: Insufficient documentation

## 2014-02-18 DIAGNOSIS — R1013 Epigastric pain: Secondary | ICD-10-CM | POA: Diagnosis not present

## 2014-02-18 DIAGNOSIS — F329 Major depressive disorder, single episode, unspecified: Secondary | ICD-10-CM | POA: Diagnosis not present

## 2014-02-18 DIAGNOSIS — K269 Duodenal ulcer, unspecified as acute or chronic, without hemorrhage or perforation: Secondary | ICD-10-CM | POA: Insufficient documentation

## 2014-02-18 DIAGNOSIS — K315 Obstruction of duodenum: Secondary | ICD-10-CM | POA: Insufficient documentation

## 2014-02-18 DIAGNOSIS — F3289 Other specified depressive episodes: Secondary | ICD-10-CM | POA: Diagnosis not present

## 2014-02-18 DIAGNOSIS — Z96659 Presence of unspecified artificial knee joint: Secondary | ICD-10-CM | POA: Insufficient documentation

## 2014-02-18 DIAGNOSIS — G43909 Migraine, unspecified, not intractable, without status migrainosus: Secondary | ICD-10-CM | POA: Diagnosis not present

## 2014-02-18 DIAGNOSIS — K449 Diaphragmatic hernia without obstruction or gangrene: Secondary | ICD-10-CM | POA: Diagnosis not present

## 2014-02-18 HISTORY — PX: ESOPHAGOGASTRODUODENOSCOPY (EGD) WITH PROPOFOL: SHX5813

## 2014-02-18 SURGERY — ESOPHAGOGASTRODUODENOSCOPY (EGD) WITH PROPOFOL
Anesthesia: Monitor Anesthesia Care

## 2014-02-18 MED ORDER — PROPOFOL INFUSION 10 MG/ML OPTIME
INTRAVENOUS | Status: DC | PRN
  Administered 2014-02-18: 160 ug/kg/min via INTRAVENOUS

## 2014-02-18 MED ORDER — LACTATED RINGERS IV SOLN
INTRAVENOUS | Status: DC | PRN
  Administered 2014-02-18: 11:00:00 via INTRAVENOUS

## 2014-02-18 MED ORDER — SODIUM CHLORIDE 0.9 % IV SOLN: INTRAVENOUS | Status: DC

## 2014-02-18 MED ORDER — FENTANYL CITRATE 0.05 MG/ML IJ SOLN: 25.0000 ug | INTRAMUSCULAR | Status: DC | PRN

## 2014-02-18 MED ORDER — PROPOFOL 10 MG/ML IV BOLUS
INTRAVENOUS | Status: DC | PRN
  Administered 2014-02-18: 80 mg via INTRAVENOUS

## 2014-02-18 MED ORDER — LACTATED RINGERS IV SOLN: INTRAVENOUS | Status: DC

## 2014-02-18 MED ORDER — PROPOFOL 10 MG/ML IV BOLUS
INTRAVENOUS | Status: AC
  Filled 2014-02-18: qty 20

## 2014-02-18 SURGICAL SUPPLY — 14 items

## 2014-02-18 NOTE — Anesthesia Preprocedure Evaluation (Addendum)
Anesthesia Evaluation  Patient identified by MRN, date of birth, ID band Patient awake    Reviewed: Allergy & Precautions, H&P , NPO status , Patient's Chart, lab work & pertinent test results  Airway Mallampati: II  TM Distance: >3 FB Neck ROM: full    Dental no notable dental hx. (+) Teeth Intact, Dental Advisory Given   Pulmonary neg pulmonary ROS,  breath sounds clear to auscultation  Pulmonary exam normal       Cardiovascular Exercise Tolerance: Good negative cardio ROS  Rhythm:regular Rate:Normal     Neuro/Psych negative neurological ROS  negative psych ROS   GI/Hepatic negative GI ROS, Neg liver ROS,   Endo/Other  negative endocrine ROS  Renal/GU negative Renal ROS  negative genitourinary   Musculoskeletal   Abdominal   Peds  Hematology negative hematology ROS (+)   Anesthesia Other Findings   Reproductive/Obstetrics negative OB ROS                             Anesthesia Physical Anesthesia Plan  ASA: I  Anesthesia Plan: MAC   Post-op Pain Management:    Induction:   Airway Management Planned:   Additional Equipment:   Intra-op Plan:   Post-operative Plan:   Informed Consent: I have reviewed the patients History and Physical, chart, labs and discussed the procedure including the risks, benefits and alternatives for the proposed anesthesia with the patient or authorized representative who has indicated his/her understanding and acceptance.   Dental Advisory Given  Plan Discussed with: CRNA and Surgeon  Anesthesia Plan Comments:         Anesthesia Quick Evaluation  

## 2014-02-18 NOTE — Anesthesia Postprocedure Evaluation (Signed)
  Anesthesia Post-op Note  Patient: Lindsay Frost  Procedure(s) Performed: Procedure(s) (LRB): ESOPHAGOGASTRODUODENOSCOPY (EGD) WITH PROPOFOL (N/A)  Patient Location: PACU  Anesthesia Type: MAC  Level of Consciousness: awake and alert   Airway and Oxygen Therapy: Patient Spontanous Breathing  Post-op Pain: mild  Post-op Assessment: Post-op Vital signs reviewed, Patient's Cardiovascular Status Stable, Respiratory Function Stable, Patent Airway and No signs of Nausea or vomiting  Last Vitals:  Filed Vitals:   02/18/14 1210  BP: 152/75  Pulse: 62  Temp:   Resp: 15    Post-op Vital Signs: stable   Complications: No apparent anesthesia complications

## 2014-02-18 NOTE — Op Note (Signed)
[facility] [place], [address]   OPERATIVE PROCEDURE REPORT  PATIENT: Lindsay Frost, Lindsay Frost  MR#: [id number] BIRTHDATE: 07-05-1956  GENDER: Female ENDOSCOPIST: Carol Ada, MD ASSISTANT:   Alcide Clever, RN, CGRN and Corliss Parish, technician  PROCEDURE DATE: 02/18/2014 PROCEDURE:   EGD, diagnostic ASA CLASS:   Class III INDICATIONS: ABM pain MEDICATIONS: MAC sedation, administered by CRNA TOPICAL ANESTHETIC:  DESCRIPTION OF PROCEDURE:   After the risks benefits and alternatives of the procedure were thoroughly explained, informed consent was obtained.  The [employer]entry [id number]  endoscope was introduced through the mouth  and advanced to the second portion of the duodenum Without limitations.      The instrument was slowly withdrawn as the mucosa was fully examined.     FINDINGS: The Z-line was sharp.  A 3 cm hiatal hernia was identified.  In the gastric antrum multiple clean-based ulcers were identified.  No evidence of any bleeding.  The largest ulcer measured 1-1.5 cm.  In the post duodenal bulbar area there was a stenosis, but the endoscope was able to pass through this area. The stenosis was a combination of stricturing and duodenal ulcer. An adequate image was not possible.   Retroflexed views revealed no abnormalities.     The scope was then withdrawn from the patient and the procedure terminated.  COMPLICATIONS: There were no complications. IMPRESSION: 1) Antral ulcers. 2) Duodenal ulcer with stenosis. 3) 3 cm hiatal hernia.  RECOMMENDATIONS: 1) PPI BID and sucralfate. 2) Repeat EGD in 2 months to assess healing and the stenosis.  If the ulcerations and stenosis persist surgical intervention will be necessary.  _______________________________ eSignedCarol Ada, MD 02/18/2014 11:48 AM

## 2014-02-18 NOTE — Transfer of Care (Signed)
Immediate Anesthesia Transfer of Care Note  Patient: Lindsay Frost  Procedure(s) Performed: Procedure(s): ESOPHAGOGASTRODUODENOSCOPY (EGD) WITH PROPOFOL (N/A)  Patient Location: endoscopy Anesthesia Type:MAC  Level of Consciousness: awake and alert   Airway & Oxygen Therapy: Patient Spontanous Breathing and Patient connected to nasal cannula oxygen  Post-op Assessment: Report given to PACU RN and Post -op Vital signs reviewed and stable  Post vital signs: Reviewed and stable  Complications: No apparent anesthesia complications

## 2014-02-18 NOTE — Discharge Instructions (Signed)
Esophagogastroduodenoscopy Care After Refer to this sheet in the next few weeks. These instructions provide you with information on caring for yourself after your procedure. Your caregiver may also give you more specific instructions. Your treatment has been planned according to current medical practices, but problems sometimes occur. Call your caregiver if you have any problems or questions after your procedure.  HOME CARE INSTRUCTIONS  Do not eat or drink anything until the numbing medicine (local anesthetic) has worn off and your gag reflex has returned. You will know that the local anesthetic has worn off when you can swallow comfortably.  Do not drive for 12 hours after the procedure or as directed by your caregiver.  Only take medicines as directed by your caregiver. SEEK MEDICAL CARE IF:   You cannot stop coughing.  You are not urinating at all or less than usual. SEEK IMMEDIATE MEDICAL CARE IF:  You have difficulty swallowing.  You cannot eat or drink.  You have worsening throat or chest pain.  You have dizziness, lightheadedness, or you faint.  You have nausea or vomiting.  You have chills.  You have a fever.  You have severe abdominal pain.  You have black, tarry, or bloody stools. Document Released: 08/19/2012 Document Reviewed: 08/19/2012 [facility] Patient Information 2014 [place], [place].   Monitored Anesthesia Care  Monitored anesthesia care is an anesthesia service for a medical procedure. Anesthesia is the loss of the ability to feel pain. It is produced by medications called anesthetics. It may affect a small area of your body (local anesthesia), a large area of your body (regional anesthesia), or your entire body (general anesthesia). The need for monitored anesthesia care depends your procedure, your condition, and the potential need for regional or general anesthesia. It is often provided during procedures where:   General anesthesia may be needed if there  are complications. This is because you need special care when you are under general anesthesia.   You will be under local or regional anesthesia. This is so that you are able to have higher levels of anesthesia if needed.   You will receive calming medications (sedatives). This is especially the case if sedatives are given to put you in a semi-conscious state of relaxation (deep sedation). This is because the amount of sedative needed to produce this state can be hard to predict. Too much of a sedative can produce general anesthesia. Monitored anesthesia care is performed by one or more caregivers who have special training in all types of anesthesia. You will need to meet with these caregivers before your procedure. During this meeting, they will ask you about your medical history. They will also give you instructions to follow. (For example, you will need to stop eating and drinking before your procedure. You may also need to stop or change medications you are taking.) During your procedure, your caregivers will stay with you. They will:   Watch your condition. This includes watching you blood pressure, breathing, and level of pain.   Diagnose and treat problems that occur.   Give medications if they are needed. These may include calming medications (sedatives) and anesthetics.   Make sure you are comfortable.  Having monitored anesthesia care does not necessarily mean that you will be under anesthesia. It does mean that your caregivers will be able to manage anesthesia if you need it or if it occurs. It also means that you will be able to have a different type of anesthesia than you are having if you need  it. When your procedure is complete, your caregivers will continue to watch your condition. They will make sure any medications wear off before you are allowed to go home.  Document Released: 05/29/2005 Document Revised: 12/28/2012 Document Reviewed: 10/14/2012 [facility] Patient Information  2014 [place], [place].

## 2014-02-18 NOTE — Consult Note (Signed)
Lindsay Frost HPI: This is a 58 year old female with complaints of worsening GERD.  The symptoms aer described as a burning sensation.  It is brought about by eating.  She is having some reflux of material after eating and burning as well.  She was taking omeprazole and stopped due to being between doctors, however, this did help.  She takes BC powders, imitrex, advil, tylenol for a lot of headaches, which may be associated with her recent meningioma diagnosis.  It is worse at night.  She eats a meal around 8-9 PM and goes to bed around 11 PM.  She denies lightheadedness, but is fatigued more lately.  No blood in her bowel movements and no change in bowel habits.  She tried Maalox and TUMS without much benefit.  Past Medical History  Diagnosis Date  . Headache(784.0)     migraines  . Arthritis   . History of migraine     last one about 2wks ago-takes Imitrex prn  . Joint pain   . Joint swelling   . Back pain     arthritis  . GERD (gastroesophageal reflux disease)     takes Omeprazole daily  . History of gastric ulcer   . Depression     takes cymbalta daily  . Ulcer   . Heart murmur     Past Surgical History  Procedure Laterality Date  . Replacement total knee Right 33yrs ago  . Esophagogastroduodenoscopy  04/03/2012    Procedure: ESOPHAGOGASTRODUODENOSCOPY (EGD);  Surgeon: Beryle Beams, MD;  Location: [facility];  Service: Endoscopy;  Laterality: N/A;  EGD w/ balloon dilation  . Balloon dilation  04/03/2012    Procedure: BALLOON DILATION;  Surgeon: Beryle Beams, MD;  Location: [facility];  Service: Endoscopy;  Laterality: N/A;  . Diagnostic laparoscopy  23yrs ago  . Colonoscopy    . Total knee arthroplasty Left 11/30/2012    Procedure: LEFT TOTAL KNEE ARTHROPLASTY;  Surgeon: Kerin Salen, MD;  Location: [facility];  Service: Orthopedics;  Laterality: Left;    Family History  Problem Relation Age of Onset  . Arthritis Mother   . Cancer Mother     breast  . Heart disease  Mother   . Depression Mother   . Heart disease Father   . Kidney disease Father     Social History:  reports that she has never smoked. She has never used smokeless tobacco. She reports that she does not drink alcohol or use illicit drugs.  Allergies: No Known Allergies  Medications:  Scheduled:  Continuous: . sodium chloride      Results for orders placed in visit on 02/17/14 (from the past 24 hour(s))  POCT URINALYSIS DIPSTICK     Status: Abnormal   Collection Time    02/17/14  1:56 PM      Result Value Ref Range   Color, UA amber     Clarity, UA hazy     Glucose, UA negative     Bilirubin, UA negative     Ketones, UA negative     Spec Grav, UA >=1.030     Blood, UA negative     pH, UA 5.0     Protein, UA 100     Urobilinogen, UA 0.2     Nitrite, UA negative     Leukocytes, UA Trace       No results found.  ROS:  As stated above in the HPI otherwise negative.  Blood pressure 141/60, pulse  71, temperature 98.3 F (36.8 C), resp. rate 19, height 5\' 3"  (1.6 m), weight 193 lb (87.544 kg), SpO2 96.00%.    PE: Gen: NAD, Alert and Oriented HEENT:  [place]/AT, EOMI Neck: Supple, no LAD Lungs: CTA Bilaterally CV: RRR without M/G/R ABM: Soft, NTND, +BS Ext: No C/C/E  Assessment/Plan: 1) Epigastric pain. 2) GERD.   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  Plan: 1) EGD now.  Beryle Beams 02/18/2014, 11:20 AM

## 2014-02-19 LAB — URINE CULTURE: Colony Count: 60000

## 2014-02-19 LAB — HM MAMMOGRAPHY

## 2014-02-21 ENCOUNTER — Encounter (HOSPITAL_COMMUNITY): Payer: Self-pay | Admitting: Gastroenterology

## 2014-02-23 NOTE — Telephone Encounter (Signed)
Unable to reach patient pre visit.  

## 2014-03-11 ENCOUNTER — Encounter: Payer: Self-pay | Admitting: Neurology

## 2014-03-11 ENCOUNTER — Ambulatory Visit (INDEPENDENT_AMBULATORY_CARE_PROVIDER_SITE_OTHER): Payer: Medicare Other | Admitting: Neurology

## 2014-03-11 VITALS — BP 118/70 | HR 74 | Temp 97.8°F | Ht 63.0 in | Wt 192.2 lb

## 2014-03-11 DIAGNOSIS — G43009 Migraine without aura, not intractable, without status migrainosus: Secondary | ICD-10-CM | POA: Diagnosis not present

## 2014-03-11 DIAGNOSIS — G4440 Drug-induced headache, not elsewhere classified, not intractable: Secondary | ICD-10-CM | POA: Diagnosis not present

## 2014-03-11 MED ORDER — ELETRIPTAN HYDROBROMIDE 40 MG PO TABS: ORAL_TABLET | ORAL | Status: DC

## 2014-03-11 NOTE — Progress Notes (Signed)
NEUROLOGY CONSULTATION NOTE  Lindsay Frost MRN: [id number] DOB: Sep 28, 1955  Referring Kristoph Sattler: Dr. Birdie Riddle Primary care Giovannina Mun: Dr. Birdie Riddle  Reason for consult:  Headache  HISTORY OF PRESENT ILLNESS: Lindsay Frost is a 58 year old right-handed woman with history of depression, migraine, and GERD who presents for headache.  Records personally reviewed.  Onset:  Early 40s Location:  Left frontal Quality:  Stabbing Intensity:  10 out of 10 Aura:  No Prodrome:  Photophobia Associated symptoms:  Nausea, phonophobia, photophobia, sometimes vomiting. No osmophobia, visual disturbance, or autonomic symptoms. Duration:  2-3 days Frequency:  Migraines occur every 2 weeks (4-6 days per month) and dull headaches occur 2 times a week (8 days per month). Total of 14 headache days per month. Triggers/exacerbating factors:  Heat Relieving factors:  Laying down in a quiet, dark, cool room with ice pack. Activity:  Cannot function when she has migraine  Past abortive therapy:  sumatriptan 50mg  (ineffective, caused racing heart)  Past preventative therapy:  Topamax (side effects, ineffective)  Current abortive therapy:  Maxalt 10mg  (takes 1 day/week.  Takes edge off), ibuprofen 800mg  (takes once a week), BC powder (takes daily, often for arthritis) Current preventative therapy:  none Other medications:  Cymbalta 60mg  twice daily, Wellbutrin XL 150mg  daily  Caffeine:  Coffee, tea, soda Alcohol:  no Smoker:  no Diet:  Junk food Exercise:  Walks dog Depression/stress:  controlled Sleep hygiene:  Good.  Sleeps 10 hours, which she feels is too much (particularly if she has a migraine) Family history of headache:  daughter  08/19/13 MRI BRAIN W/WO:  12 mm left parietal calcified meningioma without surrounding vasogenic edema, minimally increased compared to prior study from 10/2003.  PAST MEDICAL HISTORY: Past Medical History  Diagnosis Date  . Headache(784.0)     migraines  . Arthritis    . History of migraine     last one about 2wks ago-takes Imitrex prn  . Joint pain   . Joint swelling   . Back pain     arthritis  . GERD (gastroesophageal reflux disease)     takes Omeprazole daily  . History of gastric ulcer   . Depression     takes cymbalta daily  . Ulcer   . Heart murmur   . Meningioma     PAST SURGICAL HISTORY: Past Surgical History  Procedure Laterality Date  . Replacement total knee Right 67yrs ago  . Esophagogastroduodenoscopy  04/03/2012    Procedure: ESOPHAGOGASTRODUODENOSCOPY (EGD);  Surgeon: Beryle Beams, MD;  Location: [facility];  Service: Endoscopy;  Laterality: N/A;  EGD w/ balloon dilation  . Balloon dilation  04/03/2012    Procedure: BALLOON DILATION;  Surgeon: Beryle Beams, MD;  Location: [facility];  Service: Endoscopy;  Laterality: N/A;  . Diagnostic laparoscopy  1yrs ago  . Colonoscopy    . Total knee arthroplasty Left 11/30/2012    Procedure: LEFT TOTAL KNEE ARTHROPLASTY;  Surgeon: Kerin Salen, MD;  Location: [facility];  Service: Orthopedics;  Laterality: Left;  . Esophagogastroduodenoscopy (egd) with propofol N/A 02/18/2014    Procedure: ESOPHAGOGASTRODUODENOSCOPY (EGD) WITH PROPOFOL;  Surgeon: Beryle Beams, MD;  Location: [facility];  Service: Endoscopy;  Laterality: N/A;    MEDICATIONS: Current Outpatient Prescriptions on File Prior to Visit  Medication Sig Dispense Refill  . aluminum-magnesium hydroxide-simethicone (MAALOX) [phone] MG/5ML SUSP Take 30 mLs by mouth as needed (Upset stomach).      . Aspirin-Salicylamide-Caffeine (BC HEADACHE POWDER PO) Take 1 packet by mouth 2 (  two) times daily as needed (Pain).      Marland Kitchen buPROPion (WELLBUTRIN XL) 150 MG 24 hr tablet TAKE 1 TABLET BY MOUTH DAILY  90 tablet  0  . DULoxetine (CYMBALTA) 60 MG capsule Take 60 mg by mouth 2 (two) times daily.      Marland Kitchen ibuprofen (ADVIL,MOTRIN) 200 MG tablet Take 800 mg by mouth every 6 (six) hours as needed (Pain).      . rizatriptan (MAXALT) 10 MG tablet  Take 1 tablet (10 mg total) by mouth as needed for migraine. May repeat in 2 hours if needed  10 tablet  3   No current facility-administered medications on file prior to visit.    ALLERGIES: No Known Allergies  FAMILY HISTORY: Family History  Problem Relation Age of Onset  . Arthritis Mother   . Cancer Mother     breast  . Heart disease Mother   . Depression Mother   . Heart disease Father   . Kidney disease Father     SOCIAL HISTORY: History   Social History  . Marital Status: Single    Spouse Name: N/A    Number of Children: N/A  . Years of Education: N/A   Occupational History  . Not on file.   Social History Main Topics  . Smoking status: Never Smoker   . Smokeless tobacco: Never Used  . Alcohol Use: No  . Drug Use: No  . Sexual Activity: No   Other Topics Concern  . Not on file   Social History Narrative  . No narrative on file    REVIEW OF SYSTEMS: Constitutional: No fevers, chills, or sweats, no generalized fatigue, change in appetite Eyes: No visual changes, double vision, eye pain Ear, nose and throat: No hearing loss, ear pain, nasal congestion, sore throat Cardiovascular: No chest pain, palpitations Respiratory:  No shortness of breath at rest or with exertion, wheezes GastrointestinaI: No nausea, vomiting, diarrhea, abdominal pain, fecal incontinence Genitourinary:  No dysuria, urinary retention or frequency Musculoskeletal:  No neck pain, back pain Integumentary: No rash, pruritus, skin lesions Neurological: as above Psychiatric: No depression, insomnia, anxiety Endocrine: No palpitations, fatigue, diaphoresis, mood swings, change in appetite, change in weight, increased thirst Hematologic/Lymphatic:  No anemia, purpura, petechiae. Allergic/Immunologic: no itchy/runny eyes, nasal congestion, recent allergic reactions, rashes  PHYSICAL EXAM: Filed Vitals:   03/11/14 1038  BP: 118/70  Pulse: 74  Temp: 97.8 F (36.6 C)   General: No  acute distress Head:  Normocephalic/atraumatic Neck: supple, no paraspinal tenderness, full range of motion Back: No paraspinal tenderness Heart: regular rate and rhythm Lungs: Clear to auscultation bilaterally. Vascular: No carotid bruits. Neurological Exam: Mental status: alert and oriented to person, place, and time, recent and remote memory intact, fund of knowledge intact, attention and concentration intact, speech fluent and not dysarthric, language intact. Cranial nerves: CN I: not tested CN II: pupils equal, round and reactive to light, visual fields intact, fundi unremarkable, without vessel changes, exudates, hemorrhages or papilledema. CN III, IV, VI:  full range of motion, no nystagmus, no ptosis CN V: facial sensation intact CN VII: upper and lower face symmetric CN VIII: hearing intact CN IX, X: gag intact, uvula midline CN XI: sternocleidomastoid and trapezius muscles intact CN XII: tongue midline Bulk & Tone: normal, no fasciculations. Motor: 5 out of 5 throughout Sensation: Temperature and vibration intact Deep Tendon Reflexes: 2+ throughout, toes downgoing Finger to nose testing: No dysmetria Heel to shin: No dysmetria Gait: Normal station and stride. Able  to turn and walk in tandem. Romberg negative.  IMPRESSION: 1. Migraine without aura 2. Medication overuse headaches  PLAN: 1. Start Inderal LA 60 mg daily. 2. Stop Maxalt. We'll start Relpax 40 mg. 3. Stopped BC powder. Take ibuprofen only sparingly for arthritis. Should limit all pain relievers to no more than 2 days out of the week. 4. Stop caffeine 5. Call in 4 weeks with update. Followup in 3 months.   Thank you for allowing me to take part in the care of this patient.  Metta Clines, DO  CC: Annye Asa, MD

## 2014-03-11 NOTE — Patient Instructions (Signed)
Migraine Recommendations: 1.  We will start Inderal LA 60mg  at bedtime.  Side effects include sleepiness or dizziness. 2.  Stop Maxalt (shouldn't take with Inderal anyway).  Instead, we will start Relpax 40mg .  Take at immediate onset of migraine.  May repeat once in 2 hours if needed.  Due not exceed 2 pills in 24hours.   3.  Stop BC powder.  Use ibuprofen sparingly for arthritis.  All pain relievers must be limited to no more than 2 days out of the week.  This will help reduce risk of rebound headaches. 4.  Keep a headache diary. 5.  Stay adequately hydrated. 6.  Maintain good sleep hygiene. 7.  Maintain proper stress management. 8.  Call in 4 weeks with update and we can adjust dose of Inderal if needed. 9.  Follow up in 3 months.

## 2014-03-21 ENCOUNTER — Ambulatory Visit (INDEPENDENT_AMBULATORY_CARE_PROVIDER_SITE_OTHER): Payer: Medicare Other | Admitting: Family Medicine

## 2014-03-21 ENCOUNTER — Encounter: Payer: Self-pay | Admitting: Family Medicine

## 2014-03-21 VITALS — BP 130/84 | HR 69 | Temp 98.5°F | Wt 192.4 lb

## 2014-03-21 DIAGNOSIS — F32A Depression, unspecified: Secondary | ICD-10-CM

## 2014-03-21 DIAGNOSIS — F3289 Other specified depressive episodes: Secondary | ICD-10-CM

## 2014-03-21 DIAGNOSIS — G43909 Migraine, unspecified, not intractable, without status migrainosus: Secondary | ICD-10-CM

## 2014-03-21 DIAGNOSIS — F329 Major depressive disorder, single episode, unspecified: Secondary | ICD-10-CM | POA: Diagnosis not present

## 2014-03-21 MED ORDER — DULOXETINE HCL 60 MG PO CPEP: 60.0000 mg | ORAL_CAPSULE | Freq: Two times a day (BID) | ORAL | Status: DC

## 2014-03-21 NOTE — Assessment & Plan Note (Signed)
Chronic problem.  Pt's HAs are improving as mood improves.  Was unable to afford Relpax so continues Maxalt prn.  Pt has f/u scheduled w/ Neurology in Sept.  Will follow.

## 2014-03-21 NOTE — Patient Instructions (Signed)
Schedule your complete physical in 3-4 months Continue the Cymbalta twice daily and the Wellbutrin once daily Call with any questions or concerns I'm so glad you're feeling better! Have a great summer!!

## 2014-03-21 NOTE — Progress Notes (Signed)
   Subjective:    Patient ID: Lindsay Frost, female    DOB: 11-07-1955, 58 y.o.   MRN: [id number]  HPI Depression- chronic problem.  Wellbutrin added at last visit w/ some improvement.  Continues Cymbalta daily.  Has been out of Cymbalta x1 week- needs refill.  Less tearful.  Sleeping better.  Migraines- chronic problem, saw Dr Tomi Likens.  He had wanted to switch from Maxalt to Relpax but pt's insurance did not cover this.  Is taking Maxalt w/ some relief of migraine HAs.       Review of Systems For ROS see HPI     Objective:   Physical Exam  Vitals reviewed. Constitutional: She is oriented to person, place, and time. She appears well-developed and well-nourished. No distress.  Neurological: She is alert and oriented to person, place, and time.  Skin: Skin is warm and dry.  Psychiatric: She has a normal mood and affect. Her behavior is normal. Thought content normal.          Assessment & Plan:

## 2014-03-21 NOTE — Assessment & Plan Note (Signed)
Chronic problem.  Improved since adding Wellbutrin.  Refill provided on Cymbalta.  Reviewed supportive care and red flags that should prompt return.  Pt expressed understanding and is in agreement w/ plan.

## 2014-03-30 DIAGNOSIS — M1710 Unilateral primary osteoarthritis, unspecified knee: Secondary | ICD-10-CM | POA: Diagnosis not present

## 2014-04-06 ENCOUNTER — Other Ambulatory Visit: Payer: Self-pay | Admitting: Gastroenterology

## 2014-04-06 DIAGNOSIS — K315 Obstruction of duodenum: Secondary | ICD-10-CM | POA: Diagnosis not present

## 2014-04-06 DIAGNOSIS — K259 Gastric ulcer, unspecified as acute or chronic, without hemorrhage or perforation: Secondary | ICD-10-CM | POA: Diagnosis not present

## 2014-04-11 ENCOUNTER — Encounter (HOSPITAL_COMMUNITY): Payer: Self-pay | Admitting: Pharmacy Technician

## 2014-04-15 ENCOUNTER — Encounter (HOSPITAL_COMMUNITY): Payer: Self-pay | Admitting: *Deleted

## 2014-04-20 ENCOUNTER — Telehealth: Payer: Self-pay | Admitting: *Deleted

## 2014-04-20 NOTE — Telephone Encounter (Signed)
Please see if pt is willing to see another provider b/c I don't want her to wait over a week to be seen.  In the interim, she can increase the Wellbutrin to 300mg  (2 tabs daily) and continue the Cymbalta

## 2014-04-20 NOTE — Telephone Encounter (Signed)
Pt states that she has been under a lot of stress lately.  She is in the process of finding another place and has to find another place before September.  She also shared that she felt fine until this past weekend.  She experienced a severe migraine.  + n/v  While getting up from the bed to walk to the bathroom to throw up, pt fell "pretty hard" and hit her head.  States she feels fine today.  Denies headache or injury.  However, she just feels sad.  She wakes up in the morning and starts crying. She feels hopeless.  Denies suicidal thoughts at this time.  No plan.   Dr. Virgil Benedict recommendations (below) were discussed with the patient.  Pt agreed with plan. She was also given the number to the crisis line as well [facility].  She was encouraged to call the crisis line if needed and to call [facility] to schedule an appointment with counselor.  She was also highly encouraged to keep scheduled appointment with Dr. Birdie Riddle next week.  Pt agreed.

## 2014-04-20 NOTE — Telephone Encounter (Signed)
Caller name: Laquita Relation to pt: self Call back number:  [phone] Pharmacy:  Reason for call: Pt called and stated she has been very depressed and not sleeping for the last 4 days.  States she does not know anything that has triggered this.  She has been taking both the buPROPion (WELLBUTRIN XL) 150 MG 24 hr tablet and DULoxetine (CYMBALTA) 60 MG capsule.  The first available appointment was next Thursday at 10:15, she is scheduled for that time.

## 2014-04-22 ENCOUNTER — Ambulatory Visit (HOSPITAL_COMMUNITY)
Admission: RE | Admit: 2014-04-22 | Discharge: 2014-04-22 | Disposition: A | Discharge status: Home / Self Care | Payer: Medicare Other | Source: Ambulatory Visit | Attending: Gastroenterology | Admitting: Gastroenterology

## 2014-04-22 ENCOUNTER — Encounter (HOSPITAL_COMMUNITY): Payer: Medicare Other | Admitting: Anesthesiology

## 2014-04-22 ENCOUNTER — Ambulatory Visit (HOSPITAL_COMMUNITY): Payer: Medicare Other | Admitting: Anesthesiology

## 2014-04-22 ENCOUNTER — Encounter (HOSPITAL_COMMUNITY)
Admission: RE | Disposition: A | Discharge status: Home / Self Care | Payer: Self-pay | Source: Ambulatory Visit | Attending: Gastroenterology

## 2014-04-22 ENCOUNTER — Encounter (HOSPITAL_COMMUNITY): Payer: Self-pay | Admitting: *Deleted

## 2014-04-22 DIAGNOSIS — F3289 Other specified depressive episodes: Secondary | ICD-10-CM | POA: Insufficient documentation

## 2014-04-22 DIAGNOSIS — K449 Diaphragmatic hernia without obstruction or gangrene: Secondary | ICD-10-CM | POA: Insufficient documentation

## 2014-04-22 DIAGNOSIS — F329 Major depressive disorder, single episode, unspecified: Secondary | ICD-10-CM | POA: Insufficient documentation

## 2014-04-22 DIAGNOSIS — K259 Gastric ulcer, unspecified as acute or chronic, without hemorrhage or perforation: Secondary | ICD-10-CM | POA: Insufficient documentation

## 2014-04-22 DIAGNOSIS — K315 Obstruction of duodenum: Secondary | ICD-10-CM | POA: Diagnosis not present

## 2014-04-22 DIAGNOSIS — R011 Cardiac murmur, unspecified: Secondary | ICD-10-CM | POA: Diagnosis not present

## 2014-04-22 DIAGNOSIS — K319 Disease of stomach and duodenum, unspecified: Secondary | ICD-10-CM | POA: Diagnosis not present

## 2014-04-22 DIAGNOSIS — K219 Gastro-esophageal reflux disease without esophagitis: Secondary | ICD-10-CM | POA: Diagnosis not present

## 2014-04-22 HISTORY — PX: ESOPHAGOGASTRODUODENOSCOPY (EGD) WITH PROPOFOL: SHX5813

## 2014-04-22 SURGERY — ESOPHAGOGASTRODUODENOSCOPY (EGD) WITH PROPOFOL
Anesthesia: Monitor Anesthesia Care

## 2014-04-22 MED ORDER — LACTATED RINGERS IV SOLN
INTRAVENOUS | Status: DC | PRN
  Administered 2014-04-22: 09:00:00 via INTRAVENOUS

## 2014-04-22 MED ORDER — PROPOFOL 10 MG/ML IV BOLUS
INTRAVENOUS | Status: DC | PRN
  Administered 2014-04-22: 30 mg via INTRAVENOUS
  Administered 2014-04-22 (×2): 20 mg via INTRAVENOUS

## 2014-04-22 MED ORDER — PROPOFOL INFUSION 10 MG/ML OPTIME
INTRAVENOUS | Status: DC | PRN
  Administered 2014-04-22: 160 ug/kg/min via INTRAVENOUS

## 2014-04-22 MED ORDER — PROPOFOL 10 MG/ML IV BOLUS
INTRAVENOUS | Status: AC
  Filled 2014-04-22: qty 20

## 2014-04-22 MED ORDER — LIDOCAINE HCL (CARDIAC) 20 MG/ML IV SOLN
INTRAVENOUS | Status: AC
  Filled 2014-04-22: qty 5

## 2014-04-22 MED ORDER — SODIUM CHLORIDE 0.9 % IV SOLN: INTRAVENOUS | Status: DC

## 2014-04-22 MED ORDER — LIDOCAINE HCL (CARDIAC) 20 MG/ML IV SOLN
INTRAVENOUS | Status: DC | PRN
  Administered 2014-04-22: 100 mg via INTRAVENOUS

## 2014-04-22 SURGICAL SUPPLY — 14 items

## 2014-04-22 NOTE — H&P (Signed)
   Lindsay Frost HPI: The patient is well at this time.  She is here for a follow up EGD to assess the antral ulcers, duodenal ulcer, and duodenal stricture.    Past Medical History  Diagnosis Date  . Headache(784.0)     migraines  . Arthritis   . History of migraine     last one about 2wks ago-takes Imitrex prn  . Joint pain   . Joint swelling   . Back pain     arthritis  . GERD (gastroesophageal reflux disease)     takes Omeprazole daily  . History of gastric ulcer   . Depression     takes cymbalta daily  . Ulcer   . Heart murmur   . Meningioma     Past Surgical History  Procedure Laterality Date  . Replacement total knee Right 70yrs ago  . Esophagogastroduodenoscopy  04/03/2012    Procedure: ESOPHAGOGASTRODUODENOSCOPY (EGD);  Surgeon: Beryle Beams, MD;  Location: [facility];  Service: Endoscopy;  Laterality: N/A;  EGD w/ balloon dilation  . Balloon dilation  04/03/2012    Procedure: BALLOON DILATION;  Surgeon: Beryle Beams, MD;  Location: [facility];  Service: Endoscopy;  Laterality: N/A;  . Diagnostic laparoscopy  41yrs ago  . Colonoscopy    . Total knee arthroplasty Left 11/30/2012    Procedure: LEFT TOTAL KNEE ARTHROPLASTY;  Surgeon: Kerin Salen, MD;  Location: [facility];  Service: Orthopedics;  Laterality: Left;  . Esophagogastroduodenoscopy (egd) with propofol N/A 02/18/2014    Procedure: ESOPHAGOGASTRODUODENOSCOPY (EGD) WITH PROPOFOL;  Surgeon: Beryle Beams, MD;  Location: [facility];  Service: Endoscopy;  Laterality: N/A;    Family History  Problem Relation Age of Onset  . Arthritis Mother   . Cancer Mother     breast  . Heart disease Mother   . Depression Mother   . Heart disease Father   . Kidney disease Father     Social History:  reports that she has never smoked. She has never used smokeless tobacco. She reports that she does not drink alcohol or use illicit drugs.  Allergies: No Known Allergies  Medications:  Scheduled:  Continuous: .  sodium chloride      No results found for this or any previous visit (from the past 24 hour(s)).   No results found.  ROS:  As stated above in the HPI otherwise negative.  Blood pressure 125/51, temperature 98.1 F (36.7 C), temperature source Oral, resp. rate 15, height 5\' 4"  (1.626 m), weight 192 lb (87.091 kg), SpO2 99.00%.    PE: Gen: NAD, Alert and Oriented HEENT:  [place]/AT, EOMI Neck: Supple, no LAD Lungs: CTA Bilaterally CV: RRR without M/G/R ABM: Soft, NTND, +BS Ext: No C/C/E  Assessment/Plan: 1) History of ulcers and duodenal stricture  Plan: 1) EGD now.  Obinna Ehresman D 04/22/2014, 8:38 AM

## 2014-04-22 NOTE — Discharge Instructions (Signed)

## 2014-04-22 NOTE — Anesthesia Postprocedure Evaluation (Signed)
Anesthesia Post Note  Patient: Lindsay Frost  Procedure(s) Performed: Procedure(s) (LRB): ESOPHAGOGASTRODUODENOSCOPY (EGD) WITH PROPOFOL (N/A)  Anesthesia type: MAC  Patient location: PACU  Post pain: Pain level controlled  Post assessment: Post-op Vital signs reviewed  Last Vitals: BP 151/69  Pulse 73  Temp(Src) 36.9 C (Oral)  Resp 19  Ht 5\' 4"  (1.626 m)  Wt 192 lb (87.091 kg)  BMI 32.94 kg/m2  SpO2 98%  Post vital signs: Reviewed  Level of consciousness: awake  Complications: No apparent anesthesia complications

## 2014-04-22 NOTE — Anesthesia Preprocedure Evaluation (Signed)
Anesthesia Evaluation  Patient identified by MRN, date of birth, ID band Patient awake    Reviewed: Allergy & Precautions, H&P , NPO status , Patient's Chart, lab work & pertinent test results  Airway Mallampati: II TM Distance: >3 FB Neck ROM: full    Dental no notable dental hx. (+) Teeth Intact, Dental Advisory Given   Pulmonary neg pulmonary ROS,  breath sounds clear to auscultation  Pulmonary exam normal       Cardiovascular Exercise Tolerance: Good negative cardio ROS  + Valvular Problems/Murmurs Rhythm:regular Rate:Normal     Neuro/Psych  Headaches, PSYCHIATRIC DISORDERS Depression    GI/Hepatic Neg liver ROS, GERD-  ,  Endo/Other  negative endocrine ROS  Renal/GU negative Renal ROS  negative genitourinary   Musculoskeletal   Abdominal   Peds  Hematology negative hematology ROS (+)   Anesthesia Other Findings   Reproductive/Obstetrics negative OB ROS                           Anesthesia Physical  Anesthesia Plan  ASA: II  Anesthesia Plan: MAC   Post-op Pain Management:    Induction:   Airway Management Planned:   Additional Equipment:   Intra-op Plan:   Post-operative Plan:   Informed Consent: I have reviewed the patients History and Physical, chart, labs and discussed the procedure including the risks, benefits and alternatives for the proposed anesthesia with the patient or authorized representative who has indicated his/her understanding and acceptance.   Dental Advisory Given  Plan Discussed with: CRNA  Anesthesia Plan Comments:         Anesthesia Quick Evaluation

## 2014-04-22 NOTE — Op Note (Signed)
[facility] [place], [address]   OPERATIVE PROCEDURE REPORT  PATIENT: Lindsay Frost, Lindsay Frost  MR#: [id number] BIRTHDATE: Aug 29, 1956  GENDER: Female ENDOSCOPIST: Carol Ada, MD ASSISTANT:   William Dalton, technician and Luanne Bras, RN CGRN PROCEDURE DATE: 04/22/2014 PROCEDURE:   EGD, diagnostic ASA CLASS:   Class III INDICATIONS:History of ulcer MEDICATIONS: MAC sedation, administered by CRNA TOPICAL ANESTHETIC:   none  DESCRIPTION OF PROCEDURE:   After the risks benefits and alternatives of the procedure were thoroughly explained, informed consent was obtained.  The Pentax Gastroscope [id number]  endoscope was introduced through the mouth  and advanced to the second portion of the duodenum Without limitations.      The instrument was slowly withdrawn as the mucosa was fully examined.      FINDINGS: A 3 cm hiatal hernia was identified.  In the proximal gastric body a small amount of hematin was noted.  No clear site of bleeding.  The antral ulcer was almost healed.  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  No evidence of any ulcerations.  The stricture was present, but it was nonobstructive. The scope was then withdrawn from the patient and the procedure terminated.  COMPLICATIONS: There were no complications.  IMPRESSION: 1) Hiatal hernia. 2) Healed ulcers. 3) Deformed duodenal bulb with nonobstructive stricture.  RECOMMENDATIONS: 1) PPI indefinitely. 2) Follow up in 3 months.  _______________________________ eSigned:  Carol Ada, MD 04/22/2014 9:25 AM

## 2014-04-22 NOTE — Transfer of Care (Signed)
Immediate Anesthesia Transfer of Care Note  Patient: Lindsay Frost  Procedure(s) Performed: Procedure(s): ESOPHAGOGASTRODUODENOSCOPY (EGD) WITH PROPOFOL (N/A)  Patient Location: PACU and Endoscopy Unit  Anesthesia Type:MAC  Level of Consciousness: awake, alert  and patient cooperative  Airway & Oxygen Therapy: Patient Spontanous Breathing and Patient connected to nasal cannula oxygen  Post-op Assessment: Report given to PACU RN and Post -op Vital signs reviewed and stable  Post vital signs: Reviewed and stable  Complications: No apparent anesthesia complications

## 2014-04-25 ENCOUNTER — Encounter (HOSPITAL_COMMUNITY): Payer: Self-pay | Admitting: Gastroenterology

## 2014-04-28 ENCOUNTER — Ambulatory Visit (INDEPENDENT_AMBULATORY_CARE_PROVIDER_SITE_OTHER): Payer: Medicare Other | Admitting: Family Medicine

## 2014-04-28 ENCOUNTER — Encounter: Payer: Self-pay | Admitting: Family Medicine

## 2014-04-28 VITALS — BP 146/88 | HR 86 | Temp 97.9°F | Resp 16 | Wt 186.1 lb

## 2014-04-28 DIAGNOSIS — F329 Major depressive disorder, single episode, unspecified: Secondary | ICD-10-CM | POA: Diagnosis not present

## 2014-04-28 DIAGNOSIS — F32A Depression, unspecified: Secondary | ICD-10-CM

## 2014-04-28 DIAGNOSIS — F3289 Other specified depressive episodes: Secondary | ICD-10-CM

## 2014-04-28 NOTE — Progress Notes (Signed)
   Subjective:    Patient ID: Lindsay Frost, female    DOB: Dec 19, 1955, 58 y.o.   MRN: [id number]  HPI Depression- severe.  Pt has had severe migraine x10 days.  Pt called last week and was instructed to double her Wellbutrin to 300mg  daily and continue Cymbalta.  Pt reports she fell last week after getting up to go the bathroom- 'i'm so confused about the night I fell'.  'i found myself on the floor and i don't remember falling'.  Pt reports depression has centered around HAs and fall- 'it went from really bad to horrible'.  Pt 'sorta' having thoughts about harming herself.  Pt able to contract for safety today.  Pt feels increasing the Wellbutrin has helped the situation.  HA has improved.  Pt has counseling appt scheduled.   Review of Systems For ROS see HPI     Objective:   Physical Exam  Vitals reviewed. Constitutional: She is oriented to person, place, and time. She appears well-developed and well-nourished. No distress.  Neurological: She is alert and oriented to person, place, and time.  Skin: Skin is warm and dry.  Psychiatric:  Flat No tearful Speech is somewhat slowed          Assessment & Plan:

## 2014-04-28 NOTE — Patient Instructions (Signed)
Follow up in 2 weeks to recheck mood Continue the Wellbutrin 300mg  (2 tabs daily) Continue the Cymbalta once daily Please call here or the Crisis line if you are again having thoughts of harming yourself Follow up w/ counseling as scheduled- this will be helpful! Call Dr Georgie Chard office and tell them that the medication was not covered by insurance Call Dr Sherwood Gambler to schedule an appt Call with any questions or concerns Hang in there!  You can do this!

## 2014-04-28 NOTE — Progress Notes (Signed)
Pre visit review using our clinic review tool, if applicable. No additional management support is needed unless otherwise documented below in the visit note. 

## 2014-04-29 NOTE — Assessment & Plan Note (Signed)
Deteriorated.  Pt reports sxs are severe- was unable to sleep or get out of bed for 4 days.  Feels that increasing the Wellbutrin last week to 300mg  has helped her sxs.  Remains on Cymbalta 60mg .  Recommended that pt see a psychiatrist- she declines.  Did schedule a therapy appt.  Is able to contract for safety and promise that she will not harm herself.  Pt feels her depressive sxs are improving as her HA is improving.  Pt given crisis numbers and instructions to call if she's having thoughts of harming herself.  Will follow closely.

## 2014-05-06 ENCOUNTER — Ambulatory Visit (INDEPENDENT_AMBULATORY_CARE_PROVIDER_SITE_OTHER): Payer: Medicare Other | Admitting: Psychology

## 2014-05-06 DIAGNOSIS — F331 Major depressive disorder, recurrent, moderate: Secondary | ICD-10-CM | POA: Diagnosis not present

## 2014-05-09 DIAGNOSIS — Z6836 Body mass index (BMI) 36.0-36.9, adult: Secondary | ICD-10-CM | POA: Diagnosis not present

## 2014-05-09 DIAGNOSIS — D320 Benign neoplasm of cerebral meninges: Secondary | ICD-10-CM | POA: Diagnosis not present

## 2014-05-12 ENCOUNTER — Encounter: Payer: Self-pay | Admitting: Family Medicine

## 2014-05-12 ENCOUNTER — Ambulatory Visit (INDEPENDENT_AMBULATORY_CARE_PROVIDER_SITE_OTHER): Payer: Medicare Other | Admitting: Family Medicine

## 2014-05-12 VITALS — BP 130/84 | HR 76 | Temp 98.1°F | Resp 16 | Wt 187.1 lb

## 2014-05-12 DIAGNOSIS — F329 Major depressive disorder, single episode, unspecified: Secondary | ICD-10-CM | POA: Diagnosis not present

## 2014-05-12 DIAGNOSIS — F3289 Other specified depressive episodes: Secondary | ICD-10-CM | POA: Diagnosis not present

## 2014-05-12 DIAGNOSIS — R569 Unspecified convulsions: Secondary | ICD-10-CM

## 2014-05-12 DIAGNOSIS — F32A Depression, unspecified: Secondary | ICD-10-CM

## 2014-05-12 HISTORY — DX: Unspecified convulsions: R56.9

## 2014-05-12 MED ORDER — BUPROPION HCL ER (XL) 150 MG PO TB24: 150.0000 mg | ORAL_TABLET | Freq: Every morning | ORAL | Status: DC

## 2014-05-12 NOTE — Assessment & Plan Note (Signed)
New.  Pt has now had 2 questionable episodes of seizure activity w/ completely normal neuro exam and behavior in between.  Pt reports Neurosurg was supposed to call in [place] but this has not happened (do not have his note to review).  Pt has hx of meningioma and complicated migraine which could also account for her episodes.  Pt needs to f/u w/ Dr Tomi Likens but does not have appt for another month.  Will forward this note to Dr Tomi Likens and see if her appt could be scheduled sooner.

## 2014-05-12 NOTE — Progress Notes (Signed)
Pre visit review using our clinic review tool, if applicable. No additional management support is needed unless otherwise documented below in the visit note. 

## 2014-05-12 NOTE — Patient Instructions (Signed)
Call and get Dr Georgie Chard appt moved up- this is VERY important Decrease the Wellbutrin to 1 tab daily (150mg  daily)- the higher dose could be the cause of your seizure activity I'm so glad that you're feeling better and that you're seeing Terri Call with any questions or concerns Hang in there!

## 2014-05-12 NOTE — Progress Notes (Signed)
   Subjective:    Patient ID: Lindsay Frost, female    DOB: [birth year], 58 y.o.   MRN: [id number]  HPI Depression- pt's mood has improved since increase the Wellbutrin to 300mg .  Pt started seeing Terri.  Pt had episode prior to last visit where she passed out on the bathroom floor.  Pt reports she had a 2nd episode 2 weeks ago where she fell out of bed and woke 'just kicking my legs'.  Has large rug burn on L side of face.  Pt did see surgeon and was told to start [place] for possible seizures but this was not called in.  Pt was told to f/u w/ Dr Tomi Likens- appt not scheduled until 9/29.  Pt is also to have repeat MRI.   Review of Systems For ROS see HPI     Objective:   Physical Exam  Vitals reviewed. Constitutional: She is oriented to person, place, and time. She appears well-developed and well-nourished. No distress.  HENT:  Head: Normocephalic.  semi-lunar scab consistent w/ rug burn along L cheekbone  Neurological: She is alert and oriented to person, place, and time. She has normal reflexes. No cranial nerve deficit. Coordination normal.  Skin: Skin is warm and dry.  Psychiatric:  Brighter today, less flat and withdrawn          Assessment & Plan:

## 2014-05-12 NOTE — Assessment & Plan Note (Signed)
Improved since increasing Wellbutrin and starting counseling but I have concerns that the increased Wellbutrin dose is contributing to her new, possible seizure activity.  Will decrease Wellbutrin back to 150mg  and monitor for recurrent depression.  Pt expressed understanding and is in agreement w/ plan.

## 2014-05-16 ENCOUNTER — Ambulatory Visit: Payer: Medicare Other | Admitting: Psychology

## 2014-05-30 ENCOUNTER — Encounter: Payer: Self-pay | Admitting: Neurology

## 2014-05-30 ENCOUNTER — Ambulatory Visit (INDEPENDENT_AMBULATORY_CARE_PROVIDER_SITE_OTHER): Payer: Medicare Other | Admitting: Neurology

## 2014-05-30 ENCOUNTER — Other Ambulatory Visit: Payer: Self-pay | Admitting: Neurosurgery

## 2014-05-30 VITALS — BP 122/72 | HR 68 | Resp 18 | Ht 64.0 in | Wt 188.8 lb

## 2014-05-30 DIAGNOSIS — G43909 Migraine, unspecified, not intractable, without status migrainosus: Secondary | ICD-10-CM | POA: Diagnosis not present

## 2014-05-30 DIAGNOSIS — R4020 Unspecified coma: Secondary | ICD-10-CM

## 2014-05-30 DIAGNOSIS — D320 Benign neoplasm of cerebral meninges: Secondary | ICD-10-CM

## 2014-05-30 DIAGNOSIS — D329 Benign neoplasm of meninges, unspecified: Secondary | ICD-10-CM

## 2014-05-30 DIAGNOSIS — R404 Transient alteration of awareness: Secondary | ICD-10-CM

## 2014-05-30 MED ORDER — ZOLMITRIPTAN 5 MG NA SOLN: NASAL | Status: DC

## 2014-05-30 NOTE — Progress Notes (Signed)
NEUROLOGY FOLLOW UP OFFICE NOTE  Lindsay Frost [id number]  HISTORY OF PRESENT ILLNESS: Lindsay Frost is a 58 year old right-handed woman with history of depression, migraine, and GERD who follows up for migraine without aura and now for possible seizures.  UPDATE: Her Wellbutrin was increased in August to help control her depression.    At around that time, she had an episode where she woke up and found herself on the bathroom floor.  She didn't remember falling.  Later in the month, she had another episode where she woke up on the floor next to her bed and noted that she was briefly kicking both legs.  She had a severe rug burn on the left side of her face.  She did not bite her tongue during either event but she did have some urinary incontinence during the episode in the bathroom.  She saw Dr. Kathyrn Sheriff, her neurosurgeon who has been monitoring her meningioma, and was advised to start [place] for possible seizures, although it was not filled.  She is scheduled for another MRI of the brain.  Her PCP, Dr. Birdie Riddle, subsequently decreased her dose of Wellbutrin.  She has no prior history of seizures.  She was unable to start Inderal LA because her insurance wouldn't cover it.  Headaches have pretty much been unchanged. Intensity:  10/10 Duration:  1-2 days Frequency:  Migraines occur 3-4 times a month (8 days/month), dull headaches occur 4 times a month (total of 12 headache days per month)  Current abortive therapy:  Relpax 40mg  +/- ibuprofen 800mg .  Relpax sometimes takes the edge off but the headache still persists for 1-2 days. Current preventative therapy:  none Other current medications:  Cymbalta 60mg  twice daily, Wellbutrin XL 150mg   HISTORY: Onset:  Early 40s Location:  Left frontal Quality:  Stabbing Intensity:  10 out of 10 Aura:  No Prodrome:  Photophobia Associated symptoms:  Nausea, phonophobia, photophobia, sometimes vomiting. No osmophobia, visual disturbance, or  autonomic symptoms. Duration:  2-3 days Frequency:  Migraines occur every 2 weeks (4-6 days per month) and dull headaches occur 2 times a week (8 days per month). Total of 14 headache days per month. Triggers/exacerbating factors:  Heat Relieving factors:  Laying down in a quiet, dark, cool room with ice pack. Activity:  Cannot function when she has migraine  Past abortive therapy:  sumatriptan 50mg  (ineffective, caused racing heart), Maxalt (ineffective), BC powder Past preventative therapy:  Topamax (side effects, ineffective)  Caffeine:  Coffee, tea, soda Alcohol:  no Smoker:  no Diet:  Junk food Exercise:  Walks dog Depression/stress:  controlled Sleep hygiene:  Good.  Sleeps 10 hours, which she feels is too much (particularly if she has a migraine) Family history of headache:  daughter  08/19/13 MRI BRAIN W/WO:  12 mm left parietal calcified meningioma without surrounding vasogenic edema, minimally increased compared to prior study from 10/2003.  PAST MEDICAL HISTORY: Past Medical History  Diagnosis Date  . Headache(784.0)     migraines  . Arthritis   . History of migraine     last one about 2wks ago-takes Imitrex prn  . Joint pain   . Joint swelling   . Back pain     arthritis  . GERD (gastroesophageal reflux disease)     takes Omeprazole daily  . History of gastric ulcer   . Depression     takes cymbalta daily  . Ulcer   . Heart murmur   . Meningioma     MEDICATIONS:  Current Outpatient Prescriptions on File Prior to Visit  Medication Sig Dispense Refill  . buPROPion (WELLBUTRIN XL) 150 MG 24 hr tablet Take 1 tablet (150 mg total) by mouth every morning.  30 tablet  6  . DULoxetine (CYMBALTA) 60 MG capsule Take 60 mg by mouth 2 (two) times daily.      Marland Kitchen ibuprofen (ADVIL,MOTRIN) 200 MG tablet Take 800 mg by mouth every 6 (six) hours as needed (Pain).      Marland Kitchen omeprazole (PRILOSEC) 40 MG capsule Take 40 mg by mouth 2 (two) times daily.      . rizatriptan (MAXALT) 10  MG tablet Take 10 mg by mouth every 2 (two) hours as needed for migraine. May repeat in 2 hours if needed      . sucralfate (CARAFATE) 1 G tablet Take 1 g by mouth 4 (four) times daily.       No current facility-administered medications on file prior to visit.    ALLERGIES: No Known Allergies  FAMILY HISTORY: Family History  Problem Relation Age of Onset  . Arthritis Mother   . Cancer Mother     breast  . Heart disease Mother   . Depression Mother   . Heart disease Father   . Kidney disease Father     SOCIAL HISTORY: History   Social History  . Marital Status: Single    Spouse Name: N/A    Number of Children: N/A  . Years of Education: N/A   Occupational History  . Not on file.   Social History Main Topics  . Smoking status: Never Smoker   . Smokeless tobacco: Never Used  . Alcohol Use: No  . Drug Use: No  . Sexual Activity: No   Other Topics Concern  . Not on file   Social History Narrative  . No narrative on file    REVIEW OF SYSTEMS: Constitutional: No fevers, chills, or sweats, no generalized fatigue, change in appetite Eyes: No visual changes, double vision, eye pain Ear, nose and throat: No hearing loss, ear pain, nasal congestion, sore throat Cardiovascular: No chest pain, palpitations Respiratory:  No shortness of breath at rest or with exertion, wheezes GastrointestinaI: No nausea, vomiting, diarrhea, abdominal pain, fecal incontinence Genitourinary:  No dysuria, urinary retention or frequency Musculoskeletal:  No neck pain, back pain Integumentary: No rash, pruritus, skin lesions Neurological: as above Psychiatric: Depression Endocrine: No palpitations, fatigue, diaphoresis, mood swings, change in appetite, change in weight, increased thirst Hematologic/Lymphatic:  No anemia, purpura, petechiae. Allergic/Immunologic: no itchy/runny eyes, nasal congestion, recent allergic reactions, rashes  PHYSICAL EXAM: Filed Vitals:   05/30/14 1115  BP:  122/72  Pulse: 68  Resp: 18   General: No acute distress Head:  Normocephalic/atraumatic Neck: supple, no paraspinal tenderness, full range of motion Heart:  Regular rate and rhythm Lungs:  Clear to auscultation bilaterally Back: No paraspinal tenderness Neurological Exam: alert and oriented to person, place, and time. Attention span and concentration intact, recent and remote memory intact, fund of knowledge intact.  Speech fluent and not dysarthric, language intact.  CN II-XII intact. Fundoscopic exam unremarkable without vessel changes, exudates, hemorrhages or papilledema.  Bulk and tone normal, muscle strength 5/5 throughout.  Sensation to light touch, temperature and vibration intact.  Deep tendon reflexes 2+ throughout, toes downgoing.  Finger to nose and heel to shin testing intact.  Gait normal, Romberg negative.  IMPRESSION: 1.  Two possible seizures.  It could be idiopathic versus related to the meningioma versus medication-induced.  Since  the onset of these two spells occurred soon after increase in Wellbutrin, these seizures were likely provoked by Wellbutrin. 2.  Migraine without aura 3.  Meningioma 4.  Depression  PLAN: 1.  We will get a sleep-deprived EEG to look for evidence of increased seizure tendency. 2.  She is scheduled to have a follow up brain MRI.  If the MRI reveals any meningioma enlargement with vasogenic edema or if the EEG is suggestive of increased seizure tendency, then I would initiate anti-epileptic therapy. 3.  I would discontinue the Wellbutrin completely.  Instead, she may benefit from nortriptyline, as this could also address migraine prevention.  I will discuss this with Dr. Birdie Riddle.  We could prescribe immediate-release propranolol, but I will hold off given her depression. 4.  Even though these were likely provoked seizures, I would still not recommend driving until the testing is complete. 5.  Follow up in 3 months.  Metta Clines, DO  CC:  Annye Asa, MD

## 2014-05-30 NOTE — Patient Instructions (Signed)
1.  I think the possible seizure may have been due to the Wellbutrin.  Therefore, I recommend stopping the Wellbutrin and instead try either nortriptyline or Effexor for depression, because they are also used to reduce frequency of migraines.  Let my contact Dr. Birdie Riddle before we make that change. 2.  When you feel that you are getting a migraine, take Zomig 5mg  nasal spray.  At earliest onset of headache, take 1 spray in one nostril.  May repeat once in 2 hours if needed. 3.  We will get EEG to record your brain waves to look for evidence of increased risk for seizures. 4.  Get the MRI of the brain performed. 5.  I would recommend not driving until testing is completed.  If testing (MRI and EEG) are unremarkable, then you may resume driving. 6.  Follow up in 3 months.  Call in 4 weeks with update.  Keep a headache diary so we know exactly how often and for how long the headaches are.

## 2014-06-02 ENCOUNTER — Other Ambulatory Visit: Payer: Self-pay | Admitting: *Deleted

## 2014-06-02 DIAGNOSIS — R51 Headache: Secondary | ICD-10-CM

## 2014-06-02 MED ORDER — NORTRIPTYLINE HCL 25 MG PO CAPS: 25.0000 mg | ORAL_CAPSULE | Freq: Every day | ORAL | Status: DC

## 2014-06-07 ENCOUNTER — Ambulatory Visit
Admission: RE | Admit: 2014-06-07 | Discharge: 2014-06-07 | Disposition: A | Discharge status: Home / Self Care | Payer: Medicare Other | Source: Ambulatory Visit | Attending: Neurosurgery | Admitting: Neurosurgery

## 2014-06-07 ENCOUNTER — Ambulatory Visit (INDEPENDENT_AMBULATORY_CARE_PROVIDER_SITE_OTHER): Payer: Medicare Other | Admitting: Neurology

## 2014-06-07 DIAGNOSIS — G43909 Migraine, unspecified, not intractable, without status migrainosus: Secondary | ICD-10-CM

## 2014-06-07 DIAGNOSIS — D320 Benign neoplasm of cerebral meninges: Secondary | ICD-10-CM

## 2014-06-07 DIAGNOSIS — D329 Benign neoplasm of meninges, unspecified: Secondary | ICD-10-CM

## 2014-06-07 DIAGNOSIS — E236 Other disorders of pituitary gland: Secondary | ICD-10-CM | POA: Diagnosis not present

## 2014-06-07 DIAGNOSIS — R4020 Unspecified coma: Secondary | ICD-10-CM

## 2014-06-07 MED ORDER — GADOBENATE DIMEGLUMINE 529 MG/ML IV SOLN
16.0000 mL | Freq: Once | INTRAVENOUS | Status: AC | PRN
  Administered 2014-06-07: 16 mL via INTRAVENOUS

## 2014-06-08 NOTE — Procedures (Signed)
ELECTROENCEPHALOGRAM REPORT  Date of Study: 06/07/2014  Patient's Name: Lindsay Frost MRN: [id number] Date of Birth: 01-23-1956  Referring Provider: Metta Clines, DO  Indication: 59 year old woman with history of left parietal parafalcine meningioma, migraine and depression who has had episodes of loss of consciousness after initiation of bupropion.  Medications: Duloxetine, nortriptyline, Zomig  Technical Summary: This is a multichannel digital EEG recording, using the international 10-20 placement system.  Spike detection software was employed.  Description: The EEG background is symmetric, with a well-developed posterior dominant rhythm of 10 Hz, which is reactive to eye opening and closing.  Diffuse beta activity is seen, with a bilateral frontal preponderance.  Intermittent brief bursts of transient sharp waves and theta activity are seen in the left temporal region (T3).  No focal or generalized epileptiform discharges are seen.  Stage II sleep is not seen, with normal and symmetric sleep patterns.  Hyperventilation and photic stimulation were performed, and produced no abnormalities.  ECG revealed normal cardiac rate and rhythm.  Impression: This is an abnormal routine EEG of the awake and drowsy states, with activating procedures, due to the presence of brief intermittent transient sharp waves and theta activity in the left temporal region.  This suggests a focal physiologic abnormality in this region.  Although not clearly epileptiform, it may be a focus for partial seizures.  Clinical correlation advised.  Fin Hupp R. Tomi Likens, DO

## 2014-06-10 ENCOUNTER — Telehealth: Payer: Self-pay | Admitting: Neurology

## 2014-06-10 MED ORDER — LEVETIRACETAM 500 MG PO TABS: 500.0000 mg | ORAL_TABLET | Freq: Two times a day (BID) | ORAL | Status: DC

## 2014-06-10 NOTE — Telephone Encounter (Signed)
I discussed results or EEG.  There is focal abnormality in the left temporal region.  It may be related to the known meningioma.  Since the seizures occurred when the bupropion was increased, it likely was provoked by the medication.  However, I cannot say that the meningioma is not a trigger.  Therefore, I recommend starting Keppra 500mg  twice daily.  She is agreeable.  I will send the prescription to the [facility] on [address]

## 2014-06-14 ENCOUNTER — Ambulatory Visit: Payer: Medicare Other | Admitting: Neurology

## 2014-07-22 ENCOUNTER — Ambulatory Visit (INDEPENDENT_AMBULATORY_CARE_PROVIDER_SITE_OTHER): Payer: Medicare Other | Admitting: Family Medicine

## 2014-07-22 ENCOUNTER — Encounter: Payer: Self-pay | Admitting: Family Medicine

## 2014-07-22 ENCOUNTER — Other Ambulatory Visit: Payer: Self-pay | Admitting: General Practice

## 2014-07-22 VITALS — BP 130/80 | HR 84 | Temp 97.9°F | Resp 16 | Ht 63.75 in | Wt 190.2 lb

## 2014-07-22 DIAGNOSIS — E785 Hyperlipidemia, unspecified: Secondary | ICD-10-CM

## 2014-07-22 DIAGNOSIS — Z0000 Encounter for general adult medical examination without abnormal findings: Secondary | ICD-10-CM

## 2014-07-22 DIAGNOSIS — R569 Unspecified convulsions: Secondary | ICD-10-CM | POA: Diagnosis not present

## 2014-07-22 DIAGNOSIS — E669 Obesity, unspecified: Secondary | ICD-10-CM | POA: Insufficient documentation

## 2014-07-22 DIAGNOSIS — F329 Major depressive disorder, single episode, unspecified: Secondary | ICD-10-CM

## 2014-07-22 DIAGNOSIS — G43009 Migraine without aura, not intractable, without status migrainosus: Secondary | ICD-10-CM | POA: Diagnosis not present

## 2014-07-22 DIAGNOSIS — F32A Depression, unspecified: Secondary | ICD-10-CM

## 2014-07-22 DIAGNOSIS — Z6830 Body mass index (BMI) 30.0-30.9, adult: Secondary | ICD-10-CM

## 2014-07-22 HISTORY — DX: Encounter for general adult medical examination without abnormal findings: Z00.00

## 2014-07-22 HISTORY — DX: Obesity, unspecified: E66.9

## 2014-07-22 LAB — BASIC METABOLIC PANEL
BUN: 17 mg/dL (ref 6–23)
CO2: 21 mEq/L (ref 19–32)
Calcium: 8.7 mg/dL (ref 8.4–10.5)
Chloride: 107 mEq/L (ref 96–112)
Creatinine, Ser: 0.9 mg/dL (ref 0.4–1.2)
GFR: 69.21 mL/min (ref >60.00)
Glucose, Bld: 83 mg/dL (ref 70–99)
Potassium: 3.9 mEq/L (ref 3.5–5.1)
Sodium: 142 mEq/L (ref 135–145)

## 2014-07-22 LAB — CBC WITH DIFFERENTIAL/PLATELET
Basophils Absolute: 0 10*3/uL (ref 0.0–0.1)
Basophils Relative: 0.8 % (ref 0.0–3.0)
Eosinophils Absolute: 0.1 10*3/uL (ref 0.0–0.7)
Eosinophils Relative: 2.2 % (ref 0.0–5.0)
HCT: 41.2 % (ref 36.0–46.0)
Hemoglobin: 13.7 g/dL (ref 12.0–15.0)
Lymphocytes Relative: 26.6 % (ref 12.0–46.0)
Lymphs Abs: 1.4 10*3/uL (ref 0.7–4.0)
MCHC: 33.1 g/dL (ref 30.0–36.0)
MCV: 90.3 fl (ref 78.0–100.0)
Monocytes Absolute: 0.4 10*3/uL (ref 0.1–1.0)
Monocytes Relative: 7.5 % (ref 3.0–12.0)
Neutro Abs: 3.4 10*3/uL (ref 1.4–7.7)
Neutrophils Relative %: 62.9 % (ref 43.0–77.0)
Platelets: 322 10*3/uL (ref 150.0–400.0)
RBC: 4.57 Mil/uL (ref 3.87–5.11)
RDW: 14.2 % (ref 11.5–15.5)
WBC: 5.4 10*3/uL (ref 4.0–10.5)

## 2014-07-22 LAB — LIPID PANEL
Cholesterol: 245 mg/dL — ABNORMAL HIGH (ref 0–200)
HDL: 56.3 mg/dL (ref >39.00)
LDL Cholesterol: 173 mg/dL — ABNORMAL HIGH (ref 0–99)
NonHDL: 188.7
Total CHOL/HDL Ratio: 4
Triglycerides: 77 mg/dL (ref 0.0–149.0)
VLDL: 15.4 mg/dL (ref 0.0–40.0)

## 2014-07-22 LAB — HEPATIC FUNCTION PANEL
ALT: 15 U/L (ref 0–35)
AST: 20 U/L (ref 0–37)
Albumin: 3.6 g/dL (ref 3.5–5.2)
Alkaline Phosphatase: 72 U/L (ref 39–117)
Bilirubin, Direct: 0 mg/dL (ref 0.0–0.3)
Total Bilirubin: 0.1 mg/dL — ABNORMAL LOW (ref 0.2–1.2)
Total Protein: 7.4 g/dL (ref 6.0–8.3)

## 2014-07-22 LAB — TSH: TSH: 1.75 u[IU]/mL (ref 0.35–4.50)

## 2014-07-22 MED ORDER — ATORVASTATIN CALCIUM 20 MG PO TABS: 20.0000 mg | ORAL_TABLET | Freq: Every day | ORAL | Status: DC

## 2014-07-22 NOTE — Assessment & Plan Note (Signed)
Pt reports this has improved since last visit w/ addition of Pamelor.  sxs did not worsen w/ discontinuation of Wellbutrin.  Will continue to follow as this is a chronic issue for pt.

## 2014-07-22 NOTE — Patient Instructions (Signed)
Follow up in 6 months to recheck BP We'll notify you of your lab results and make any changes if needed Try and make healthy food choices and get regular exercise Call with any questions or concerns Happy Holidays!!!

## 2014-07-22 NOTE — Progress Notes (Signed)
   Subjective:    Patient ID: Lindsay Frost, female    DOB: 06/16/1956, 58 y.o.   MRN: [id number]  HPI Here today for CPE.  Risk Factors: Seizures- new problem for pt as of this summer.  Felt to be triggered by Wellbutrin.  Has been seizure free since starting Keppra w/ Dr Tomi Likens.  Pt reports she is driving. Obesity- chronic problem, not following healthy diet or exercising more than walking the dog  Physical Activity: pt is walking dogs daily Fall Risk: pt has fallen multiple times this year but that was felt to be due to seizure activity Depression: chronic problem, severe at times but 'it's really doing good'.  On Cymbalta and Nortriptyline. Hearing: 'i can't hear hardly anything'.  Decreased to conversational tones.  Has hearing aid but 'i quit wearing it b/c it basically confused me'. ADL's: independent Cognitive: slowed thought process but linear.  Memory and attention intact Home Safety: safe at home, lives alone but good local support. Height, Weight, BMI, Visual Acuity: see vitals, vision corrected to 20/20 w/ glasses Counseling: UTD on colonoscopy, mammo, pt reports pap was last year w/ Dr Criss Rosales.   Labs Ordered: See A&P Care Plan: See A&P    Review of Systems Patient reports no vision changes, adenopathy,fever, weight change,  persistant/recurrent hoarseness , swallowing issues, chest pain, palpitations, edema, persistant/recurrent cough, hemoptysis, dyspnea (rest/exertional/paroxysmal nocturnal), gastrointestinal bleeding (melena, rectal bleeding), abdominal pain, significant heartburn, bowel changes, GU symptoms (dysuria, hematuria, incontinence), Gyn symptoms (abnormal  bleeding, pain),  syncope, focal weakness, memory loss, numbness & tingling, skin/hair/nail changes, abnormal bruising or bleeding, anxiety, or depression.     Objective:   Physical Exam General Appearance:    Alert, cooperative, no distress, appears stated age, obese  Head:    Normocephalic, without obvious  abnormality, atraumatic  Eyes:    PERRL, conjunctiva/corneas clear, EOM's intact, fundi    benign, both eyes  Ears:    Normal TM's and external ear canals, both ears  Nose:   Nares normal, septum midline, mucosa normal, no drainage    or sinus tenderness  Throat:   Lips, mucosa, and tongue normal; teeth and gums normal  Neck:   Supple, symmetrical, trachea midline, no adenopathy;    Thyroid: no enlargement/tenderness/nodules  Back:     Symmetric, no curvature, ROM normal, no CVA tenderness  Lungs:     Clear to auscultation bilaterally, respirations unlabored  Chest Wall:    No tenderness or deformity   Heart:    Regular rate and rhythm, S1 and S2 normal, no murmur, rub   or gallop  Breast Exam:    Deferred to mammo  Abdomen:     Soft, non-tender, bowel sounds active all four quadrants,    no masses, no organomegaly  Genitalia:    Deferred  Rectal:    Extremities:   Extremities normal, atraumatic, no cyanosis or edema  Pulses:   2+ and symmetric all extremities  Skin:   Skin color, texture, turgor normal, no rashes or lesions  Lymph nodes:   Cervical, supraclavicular, and axillary nodes normal  Neurologic:   CNII-XII intact, normal strength, sensation and reflexes    throughout          Assessment & Plan:

## 2014-07-22 NOTE — Assessment & Plan Note (Signed)
Pt's PE WNL w/ exception of obesity.  UTD on pap, mammo, colonoscopy.  Written screening schedule updated and given to pt.  Check labs.  Stressed need for healthy diet and regular exercise.  Anticipatory guidance provided.

## 2014-07-22 NOTE — Assessment & Plan Note (Signed)
New.  Pt continues to gain weight.  Some of this may be medication related.  Stressed the need for healthy, low carb diet and regular exercise.  Will check labs to risk stratify.  Will follow.

## 2014-07-22 NOTE — Progress Notes (Signed)
Pre visit review using our clinic review tool, if applicable. No additional management support is needed unless otherwise documented below in the visit note. 

## 2014-07-22 NOTE — Assessment & Plan Note (Signed)
Chronic problem.  Now following w/ Dr Tomi Likens.  Using Maxalt as needed but she reports HAs have improved recently.  Will continue to follow along and assist as able

## 2014-07-22 NOTE — Assessment & Plan Note (Signed)
Pt has not had seizure since starting Keppra and stopping Wellbutrin.  Following w/ Dr Tomi Likens.  Pt is driving.  Reports Keppra causes some fatigue and slowed thinking but otherwise is doing well on the medication.  Will follow along.

## 2014-08-24 DIAGNOSIS — R194 Change in bowel habit: Secondary | ICD-10-CM | POA: Diagnosis not present

## 2014-08-24 DIAGNOSIS — R1031 Right lower quadrant pain: Secondary | ICD-10-CM | POA: Diagnosis not present

## 2014-08-24 DIAGNOSIS — R1032 Left lower quadrant pain: Secondary | ICD-10-CM | POA: Diagnosis not present

## 2014-08-29 ENCOUNTER — Ambulatory Visit (INDEPENDENT_AMBULATORY_CARE_PROVIDER_SITE_OTHER): Payer: Medicare Other | Admitting: Neurology

## 2014-08-29 ENCOUNTER — Encounter: Payer: Self-pay | Admitting: Neurology

## 2014-08-29 VITALS — BP 128/88 | HR 72 | Resp 16 | Ht 64.0 in | Wt 193.0 lb

## 2014-08-29 DIAGNOSIS — D329 Benign neoplasm of meninges, unspecified: Secondary | ICD-10-CM

## 2014-08-29 DIAGNOSIS — G43009 Migraine without aura, not intractable, without status migrainosus: Secondary | ICD-10-CM

## 2014-08-29 DIAGNOSIS — R55 Syncope and collapse: Secondary | ICD-10-CM | POA: Diagnosis not present

## 2014-08-29 NOTE — Progress Notes (Signed)
NEUROLOGY FOLLOW UP OFFICE NOTE  Lindsay Frost [id number]  HISTORY OF PRESENT ILLNESS: Lindsay Frost is a 58 year old right-handed woman with history of depression, migraine, and GERD who follows up for migraine without aura and possible seizure.  MRI reviewed.  UPDATE: Migraine: Intensity:  7/10 Duration:  12 hours Frequency:  3 times a month  Current abortive therapy: Maxalt 10mg  followed by ibuprofen 1000mg  Current preventative therapy:  none Other current medications:  Cymbalta 60mg  twice daily  MRI of the brain with and without contrast was performed on 06/08/14, which demonstrated stability of the meningioma.  Seizures: A repeat MRI of the brain with and without contrast was performed on 06/07/14, which showed that the left parafalcine meningioma looked stable compared to prior scan from 08/19/13 and without vasogenic edema. She had a sleep deprived EEG performed on 06/07/14, which showed brief intermittent transient sharp waves and slowing in the left temporal region, which although not epileptiform, could be a seizure focus. She was started on Keppra 500mg  twice daily.  She has not had any other episodes of loss of consciousness or seizure like activity.  HISTORY: Migraine: Onset:  Early 40s Location:  Left frontal Quality:  Stabbing Intensity:  10 out of 10; September 10/10 Aura:  No Prodrome:  Photophobia Associated symptoms:  Nausea, phonophobia, photophobia, sometimes vomiting. No osmophobia, visual disturbance, or autonomic symptoms. Duration:  2-3 days; September 1-2 days Frequency:  Migraines occur every 2 weeks (4-6 days per month) and dull headaches occur 2 times a week (8 days per month). Total of 14 headache days per month; September 3-4 times a month (8 days/month), dull headaches occur 4 times a month (total of 12 headache days per month) Triggers/exacerbating factors:  Heat Relieving factors:  Laying down in a quiet, dark, cool room with ice  pack. Activity:  Cannot function when she has migraine  Past abortive therapy:  sumatriptan 50mg  (ineffective, caused racing heart), Maxalt (ineffective), BC powder Past preventative therapy:  Topamax (side effects, ineffective) Unable to start Inderal LA because insurance wouldn't cover it.  Caffeine:  Coffee, tea, soda Alcohol:  no Smoker:  no Diet:  Junk food Exercise:  Walks dog Depression/stress:  controlled Sleep hygiene:  Good.  Sleeps 10 hours, which she feels is too much (particularly if she has a migraine) Family history of headache:  daughter  08/19/13 MRI BRAIN W/WO:  12 mm left parietal calcified meningioma without surrounding vasogenic edema, minimally increased compared to prior study from 10/2003.  Seizure-like episodes: Her Wellbutrin was increased in August to help control her depression.    At around that time, she had an episode where she woke up and found herself on the bathroom floor.  She didn't remember falling.  Later in the month, she had another episode where she woke up on the floor next to her bed and noted that she was briefly kicking both legs.  She had a severe rug burn on the left side of her face.  She did not bite her tongue during either event but she did have some urinary incontinence during the episode in the bathroom.  She saw Dr. Kathyrn Sheriff, her neurosurgeon who has been monitoring her meningioma, and was advised to start [place] for possible seizures, although it was not filled.  She is scheduled for another MRI of the brain.  Her PCP, Dr. Birdie Riddle, subsequently decreased her dose of Wellbutrin.  She has no prior history of seizures.  PAST MEDICAL HISTORY: Past Medical History  Diagnosis  Date  . Headache(784.0)     migraines  . Arthritis   . History of migraine     last one about 2wks ago-takes Imitrex prn  . Joint pain   . Joint swelling   . Back pain     arthritis  . GERD (gastroesophageal reflux disease)     takes Omeprazole daily  . History of  gastric ulcer   . Depression     takes cymbalta daily  . Ulcer   . Heart murmur   . Meningioma     MEDICATIONS: Current Outpatient Prescriptions on File Prior to Visit  Medication Sig Dispense Refill  . atorvastatin (LIPITOR) 20 MG tablet Take 1 tablet (20 mg total) by mouth daily. 30 tablet 6  . DULoxetine (CYMBALTA) 60 MG capsule Take 60 mg by mouth 2 (two) times daily.    Marland Kitchen HYDROcodone-acetaminophen (NORCO/VICODIN) 5-325 MG per tablet 1 tablet. PRN  0  . ibuprofen (ADVIL,MOTRIN) 200 MG tablet Take 800 mg by mouth every 6 (six) hours as needed (Pain).    Marland Kitchen levETIRAcetam (KEPPRA) 500 MG tablet Take 1 tablet (500 mg total) by mouth 2 (two) times daily. 60 tablet 5  . nortriptyline (PAMELOR) 25 MG capsule Take 1 capsule (25 mg total) by mouth at bedtime. 30 capsule 3  . omeprazole (PRILOSEC) 40 MG capsule Take 40 mg by mouth 2 (two) times daily.    . rizatriptan (MAXALT) 10 MG tablet Take 10 mg by mouth every 2 (two) hours as needed for migraine. May repeat in 2 hours if needed    . sucralfate (CARAFATE) 1 G tablet Take 1 g by mouth 4 (four) times daily.     No current facility-administered medications on file prior to visit.    ALLERGIES: No Known Allergies  FAMILY HISTORY: Family History  Problem Relation Age of Onset  . Arthritis Mother   . Cancer Mother     breast  . Heart disease Mother   . Depression Mother   . Heart disease Father   . Kidney disease Father     SOCIAL HISTORY: History   Social History  . Marital Status: Single    Spouse Name: N/A    Number of Children: N/A  . Years of Education: N/A   Occupational History  . Not on file.   Social History Main Topics  . Smoking status: Never Smoker   . Smokeless tobacco: Never Used  . Alcohol Use: No  . Drug Use: No  . Sexual Activity: No   Other Topics Concern  . Not on file   Social History Narrative    REVIEW OF SYSTEMS: Constitutional: No fevers, chills, or sweats, no generalized fatigue,  change in appetite Eyes: No visual changes, double vision, eye pain Ear, nose and throat: No hearing loss, ear pain, nasal congestion, sore throat Cardiovascular: No chest pain, palpitations Respiratory:  No shortness of breath at rest or with exertion, wheezes GastrointestinaI: No nausea, vomiting, diarrhea, abdominal pain, fecal incontinence Genitourinary:  No dysuria, urinary retention or frequency Musculoskeletal:  No neck pain, back pain Integumentary: No rash, pruritus, skin lesions Neurological: as above Psychiatric: No depression, insomnia, anxiety Endocrine: No palpitations, fatigue, diaphoresis, mood swings, change in appetite, change in weight, increased thirst Hematologic/Lymphatic:  No anemia, purpura, petechiae. Allergic/Immunologic: no itchy/runny eyes, nasal congestion, recent allergic reactions, rashes  PHYSICAL EXAM: Filed Vitals:   08/29/14 1249  BP: 128/88  Pulse: 72  Resp: 16   General: No acute distress Head:  Normocephalic/atraumatic Eyes:  Fundoscopic exam unremarkable without vessel changes, exudates, hemorrhages or papilledema. Neck: supple, no paraspinal tenderness, full range of motion Heart:  Regular rate and rhythm Lungs:  Clear to auscultation bilaterally Back: No paraspinal tenderness Neurological Exam: alert and oriented to person, place, and time. Attention span and concentration intact, recent and remote memory intact, fund of knowledge intact.  Speech fluent and not dysarthric, language intact.  CN II-XII intact. Fundoscopic exam unremarkable without vessel changes, exudates, hemorrhages or papilledema.  Bulk and tone normal, muscle strength 5/5 throughout.  Sensation to light touch intact.  Deep tendon reflexes 2+ throughout.  Finger to nose  testing intact.  Gait normal.  IMPRESSION: Episodic migraine without aura Transient loss of consciousness.  With meningioma, consider possible seizure Meningioma, stable  PLAN: 1.  At earliest onset of  headache, take Maxalt 10mg  with naproxen 500mg .  The two medications may have a synergistic effect and naproxen is long-acting, which will hopefully prevent the headache from returning later in the day. 2.  Continue Keppra 500mg  twice daily 3.  Follow up in 6 months  15 minutes spent with patient, over 50% spent discussing management.  Metta Clines, DO  CC: Annye Asa, MD

## 2014-08-29 NOTE — Patient Instructions (Addendum)
1.  At the earliest onset of the headache, take the Maxalt with naproxen 500mg .  May repeat the Maxalt alone once in 2 hours if needed. 2.  Continue Keppra 500mg  twice daily 3.  Follow up in 6 months.

## 2014-09-05 ENCOUNTER — Encounter: Payer: Self-pay | Admitting: Family Medicine

## 2014-09-05 ENCOUNTER — Ambulatory Visit (INDEPENDENT_AMBULATORY_CARE_PROVIDER_SITE_OTHER): Payer: Medicare Other | Admitting: Family Medicine

## 2014-09-05 VITALS — BP 124/79 | HR 86 | Temp 98.0°F | Resp 12 | Wt 190.6 lb

## 2014-09-05 DIAGNOSIS — J019 Acute sinusitis, unspecified: Secondary | ICD-10-CM | POA: Insufficient documentation

## 2014-09-05 DIAGNOSIS — J0100 Acute maxillary sinusitis, unspecified: Secondary | ICD-10-CM | POA: Insufficient documentation

## 2014-09-05 HISTORY — DX: Acute sinusitis, unspecified: J01.90

## 2014-09-05 MED ORDER — AMOXICILLIN 875 MG PO TABS: 875.0000 mg | ORAL_TABLET | Freq: Two times a day (BID) | ORAL | Status: DC

## 2014-09-05 MED ORDER — PROMETHAZINE-DM 6.25-15 MG/5ML PO SYRP: 5.0000 mL | ORAL_SOLUTION | Freq: Four times a day (QID) | ORAL | Status: DC | PRN

## 2014-09-05 NOTE — Progress Notes (Signed)
Pre visit review using our clinic review tool, if applicable. No additional management support is needed unless otherwise documented below in the visit note. 

## 2014-09-05 NOTE — Progress Notes (Signed)
   Subjective:    Patient ID: Lindsay Frost, female    DOB: November 12, 1955, 58 y.o.   MRN: [id number]  HPI URI- sxs started 12/17 w/ congestion, cough.  'coughing up bloody mucous'.  Some dizziness.  Mild SOB.  Some bilateral ear fullness.  No fevers.  + facial pain/pressure.  + nausea.  No vomiting.  No known sick contacts.   Review of Systems For ROS see HPI     Objective:   Physical Exam  Constitutional: She appears well-developed and well-nourished. No distress.  HENT:  Head: Normocephalic and atraumatic.  Right Ear: Tympanic membrane normal.  Left Ear: Tympanic membrane normal.  Nose: Mucosal edema and rhinorrhea present. Right sinus exhibits maxillary sinus tenderness and frontal sinus tenderness. Left sinus exhibits maxillary sinus tenderness and frontal sinus tenderness.  Mouth/Throat: Uvula is midline and mucous membranes are normal. Posterior oropharyngeal erythema present. No oropharyngeal exudate.  Eyes: Conjunctivae and EOM are normal. Pupils are equal, round, and reactive to light.  Neck: Normal range of motion. Neck supple.  Cardiovascular: Normal rate, regular rhythm and normal heart sounds.   Pulmonary/Chest: Effort normal and breath sounds normal. No respiratory distress. She has no wheezes.  Lymphadenopathy:    She has no cervical adenopathy.  Vitals reviewed.         Assessment & Plan:

## 2014-09-05 NOTE — Patient Instructions (Signed)
Follow up as needed Start the Amoxicillin twice daily- take w/ food- for the sinus infection Use the promethazine cough syrup for nights/weekends- will cause drowsiness REST! Mucinex DM for daytime cough Call with any questions or concerns Happy Holidays!!

## 2014-09-11 NOTE — Assessment & Plan Note (Signed)
Pt's sxs and PE consistent w/ infxn.  Start abx.  Cough meds prn.  Reviewed supportive care and red flags that should prompt return.  Pt expressed understanding and is in agreement w/ plan.  

## 2014-09-27 DIAGNOSIS — Z96652 Presence of left artificial knee joint: Secondary | ICD-10-CM | POA: Diagnosis not present

## 2014-09-27 DIAGNOSIS — Z96651 Presence of right artificial knee joint: Secondary | ICD-10-CM | POA: Diagnosis not present

## 2014-11-07 ENCOUNTER — Ambulatory Visit (INDEPENDENT_AMBULATORY_CARE_PROVIDER_SITE_OTHER): Payer: Medicare Other | Admitting: Family Medicine

## 2014-11-07 ENCOUNTER — Encounter: Payer: Self-pay | Admitting: Family Medicine

## 2014-11-07 VITALS — BP 124/86 | HR 85 | Temp 98.2°F | Resp 16 | Wt 189.2 lb

## 2014-11-07 DIAGNOSIS — J0101 Acute recurrent maxillary sinusitis: Secondary | ICD-10-CM

## 2014-11-07 MED ORDER — PROMETHAZINE-DM 6.25-15 MG/5ML PO SYRP: 5.0000 mL | ORAL_SOLUTION | Freq: Four times a day (QID) | ORAL | Status: DC | PRN

## 2014-11-07 MED ORDER — AMOXICILLIN 875 MG PO TABS: 875.0000 mg | ORAL_TABLET | Freq: Two times a day (BID) | ORAL | Status: DC

## 2014-11-07 NOTE — Patient Instructions (Signed)
Follow up as needed Start the Amoxicillin as directed- take w/ food Drink plenty of fluids Claritin or Zyrtec daily for the allergy component REST! Use the cough syrup as needed- will cause drowsiness Use Mucinex DM for daytime cough (won't cause drowsiness) Call with any questions or concerns Hang in there!!!

## 2014-11-07 NOTE — Assessment & Plan Note (Signed)
Pt's sxs and PE consistent w/ infxn.  Start abx.  Cough meds prn.  Pt was told by ENT previously that she would benefit from surgery to fix deviated septum.  If pt has another infxn this year, would recommend f/u w/ ENT to determine the next steps.  Pt prefers [facility] provider.  Reviewed supportive care and red flags that should prompt return.  Pt expressed understanding and is in agreement w/ plan.

## 2014-11-07 NOTE — Progress Notes (Signed)
   Subjective:    Patient ID: Lindsay Frost, female    DOB: December 05, 1955, 59 y.o.   MRN: [id number]  HPI URI- sxs started 'last week' w/ 'a sinus headache', 'my nose would swell', and 'bloody discharge' from nose.  + cough- worse at night.  'i just can't breathe'- worse at night.  Pt had seen ENT previously and was told she would benefit from nasal surgery.  No fevers.  No tooth pain.  + sick contacts.  No ear pain.   Review of Systems For ROS see HPI     Objective:   Physical Exam  Constitutional: She appears well-developed and well-nourished. No distress.  HENT:  Head: Normocephalic and atraumatic.  Right Ear: Tympanic membrane normal.  Left Ear: Tympanic membrane normal.  Nose: Mucosal edema and rhinorrhea present. Right sinus exhibits maxillary sinus tenderness and frontal sinus tenderness. Left sinus exhibits maxillary sinus tenderness and frontal sinus tenderness.  Mouth/Throat: Uvula is midline and mucous membranes are normal. Posterior oropharyngeal erythema present. No oropharyngeal exudate.  Eyes: Conjunctivae and EOM are normal. Pupils are equal, round, and reactive to light.  Neck: Normal range of motion. Neck supple.  Cardiovascular: Normal rate, regular rhythm and normal heart sounds.   Pulmonary/Chest: Effort normal and breath sounds normal. No respiratory distress. She has no wheezes.  Lymphadenopathy:    She has no cervical adenopathy.  Vitals reviewed.         Assessment & Plan:

## 2014-11-07 NOTE — Progress Notes (Signed)
Pre visit review using our clinic review tool, if applicable. No additional management support is needed unless otherwise documented below in the visit note. 

## 2014-12-17 ENCOUNTER — Other Ambulatory Visit: Payer: Self-pay | Admitting: Neurology

## 2014-12-20 ENCOUNTER — Telehealth: Payer: Self-pay | Admitting: Neurology

## 2014-12-20 NOTE — Telephone Encounter (Signed)
Pt called wanting to see if Dr. Tomi Likens can Rx her anything for her headaches. She stated that she has been having headaches for 2 weeks now. C.b [phone]

## 2014-12-20 NOTE — Telephone Encounter (Signed)
Prior Auth was gotten for Relpax  40 mg [id number]  Valid until 09/16/15 spoke with Tillie Rung  Patient is aware

## 2014-12-20 NOTE — Telephone Encounter (Signed)
Pt called stating that she is returning your call. C/b [phone]

## 2014-12-20 NOTE — Telephone Encounter (Signed)
I informed patient that Relpax  Was sent to pharmacy this am 7:34am # 9  And advised her to call me back if it is not there

## 2015-01-13 ENCOUNTER — Ambulatory Visit (INDEPENDENT_AMBULATORY_CARE_PROVIDER_SITE_OTHER): Payer: Medicare Other | Admitting: Neurology

## 2015-01-13 ENCOUNTER — Encounter: Payer: Self-pay | Admitting: Neurology

## 2015-01-13 VITALS — BP 140/70 | HR 68 | Resp 16 | Ht 64.0 in | Wt 187.4 lb

## 2015-01-13 DIAGNOSIS — G40209 Localization-related (focal) (partial) symptomatic epilepsy and epileptic syndromes with complex partial seizures, not intractable, without status epilepticus: Secondary | ICD-10-CM

## 2015-01-13 DIAGNOSIS — G43009 Migraine without aura, not intractable, without status migrainosus: Secondary | ICD-10-CM

## 2015-01-13 HISTORY — DX: Migraine without aura, not intractable, without status migrainosus: G43.009

## 2015-01-13 HISTORY — DX: Localization-related (focal) (partial) symptomatic epilepsy and epileptic syndromes with complex partial seizures, not intractable, without status epilepticus: G40.209

## 2015-01-13 MED ORDER — NORTRIPTYLINE HCL 50 MG PO CAPS: 50.0000 mg | ORAL_CAPSULE | Freq: Every day | ORAL | Status: DC

## 2015-01-13 MED ORDER — LEVETIRACETAM 750 MG PO TABS: 750.0000 mg | ORAL_TABLET | Freq: Two times a day (BID) | ORAL | Status: DC

## 2015-01-13 NOTE — Progress Notes (Signed)
NEUROLOGY FOLLOW UP OFFICE NOTE  Lindsay Frost [id number]  HISTORY OF PRESENT ILLNESS: Lindsay Frost is a 59 year old right-handed woman with history of depression, migraine, and GERD who follows up for migraine without aura and  seizure.    UPDATE: Migraine: About 3 weeks ago, she developed a migraine which lasted 3 days. She was prescribed Relpax, which helped, however she began having a migraine every 3 days.  It responds to Relpax after 1.5 hours. I Current abortive therapy: relpax 40mg  Current preventative therapy:  nortriptyline 25mg  at bedtime Other current medications:  Cymbalta 60mg  twice daily  Seizure: She is taking Keppra 500mg  twice daily.  Yesterday, she had another episode of black out.  She was on the couch watching TV.  She felt fine.  No prodrome.  The next thing she knows, she is slumped over the couch.  She felt confused.  She did not bite her tongue or have incontinence.  Her cell phone, which had been in front of her, was now under the couch.  HISTORY: Migraine: Onset:  Early 40s Location:  Left frontal Quality:  Stabbing Initial Intensity:  10 out of 10; December 7/10 Aura:  No Prodrome:  Photophobia Associated symptoms:  Nausea, phonophobia, photophobia, sometimes vomiting. No osmophobia, visual disturbance, or autonomic symptoms. Initial Duration:  2-3 days; December 12 hours Initial Frequency:  Migraines occur every 2 weeks (4-6 days per month) and dull headaches occur 2 times a week (8 days per month). Total of 14 headache days per month; December 3 days per month. Triggers/exacerbating factors:  Heat Relieving factors:  Laying down in a quiet, dark, cool room with ice pack. Activity:  Cannot function when she has migraine  Past abortive therapy:  sumatriptan 50mg  (ineffective, caused racing heart), Maxalt (ineffective), BC powder Past preventative therapy:  Topamax (side effects, ineffective) Unable to start Inderal LA because insurance wouldn't  cover it.  Caffeine:  Coffee, tea, soda Alcohol:  no Smoker:  no Diet:  Junk food Exercise:  Walks dog Depression/stress:  controlled Sleep hygiene:  Good.  Sleeps 10 hours, which she feels is too much (particularly if she has a migraine) Family history of headache:  daughter  08/19/13 MRI BRAIN W/WO:  12 mm left parietal calcified meningioma without surrounding vasogenic edema, minimally increased compared to prior study from 10/2003. MRI of the brain with and without contrast was performed on 06/08/14, which demonstrated stability of the meningioma.  Seizure-like episodes: Her Wellbutrin was increased in August to help control her depression.    At around that time, she had an episode where she woke up and found herself on the bathroom floor.  She didn't remember falling.  Later in the month, she had another episode where she woke up on the floor next to her bed and noted that she was briefly kicking both legs.  She had a severe rug burn on the left side of her face.  She did not bite her tongue during either event but she did have some urinary incontinence during the episode in the bathroom.  She saw Dr. Kathyrn Sheriff, her neurosurgeon who has been monitoring her meningioma, and was advised to start [place] for possible seizures, although it was not filled.  She is scheduled for another MRI of the brain.  Her PCP, Dr. Birdie Riddle, subsequently decreased her dose of Wellbutrin.  She has no prior history of seizures.  She had a sleep deprived EEG performed on 06/07/14, which showed brief intermittent transient sharp waves and slowing  in the left temporal region, which although not epileptiform, could be a seizure focus.    PAST MEDICAL HISTORY: Past Medical History  Diagnosis Date  . Headache(784.0)     migraines  . Arthritis   . History of migraine     last one about 2wks ago-takes Imitrex prn  . Joint pain   . Joint swelling   . Back pain     arthritis  . GERD (gastroesophageal reflux disease)      takes Omeprazole daily  . History of gastric ulcer   . Depression     takes cymbalta daily  . Ulcer   . Heart murmur   . Meningioma     MEDICATIONS: Current Outpatient Prescriptions on File Prior to Visit  Medication Sig Dispense Refill  . amoxicillin (AMOXIL) 875 MG tablet Take 1 tablet (875 mg total) by mouth 2 (two) times daily. 20 tablet 0  . atorvastatin (LIPITOR) 20 MG tablet Take 1 tablet (20 mg total) by mouth daily. 30 tablet 6  . DULoxetine (CYMBALTA) 60 MG capsule Take 60 mg by mouth 2 (two) times daily.    Marland Kitchen HYDROcodone-acetaminophen (NORCO/VICODIN) 5-325 MG per tablet 1 tablet. PRN  0  . ibuprofen (ADVIL,MOTRIN) 200 MG tablet Take 800 mg by mouth every 6 (six) hours as needed (Pain).    Marland Kitchen omeprazole (PRILOSEC) 40 MG capsule Take 40 mg by mouth 2 (two) times daily.    . promethazine-dextromethorphan (PROMETHAZINE-DM) 6.25-15 MG/5ML syrup Take 5 mLs by mouth 4 (four) times daily as needed. 240 mL 0  . RELPAX 40 MG tablet   0  . RELPAX 40 MG tablet TAKE 1 TABLET BY MOUTH AT ONSET OF HEADACHE. MAY REPEAT IN 2 HOURS IF HEADACHE PERSISTS OR RECURS. DO NOT EXCEED 2 TABLETS IN 24 HOURS 9 tablet 0  . sucralfate (CARAFATE) 1 G tablet Take 1 g by mouth 4 (four) times daily.    . rizatriptan (MAXALT) 10 MG tablet Take 10 mg by mouth every 2 (two) hours as needed for migraine. May repeat in 2 hours if needed     No current facility-administered medications on file prior to visit.    ALLERGIES: No Known Allergies  FAMILY HISTORY: Family History  Problem Relation Age of Onset  . Arthritis Mother   . Cancer Mother     breast  . Heart disease Mother   . Depression Mother   . Heart disease Father   . Kidney disease Father     SOCIAL HISTORY: History   Social History  . Marital Status: Single    Spouse Name: N/A  . Number of Children: N/A  . Years of Education: N/A   Occupational History  . Not on file.   Social History Main Topics  . Smoking status: Never Smoker   .  Smokeless tobacco: Never Used  . Alcohol Use: No  . Drug Use: No  . Sexual Activity: No   Other Topics Concern  . Not on file   Social History Narrative    REVIEW OF SYSTEMS: Constitutional: No fevers, chills, or sweats, no generalized fatigue, change in appetite Eyes: No visual changes, double vision, eye pain Ear, nose and throat: No hearing loss, ear pain, nasal congestion, sore throat Cardiovascular: No chest pain, palpitations Respiratory:  No shortness of breath at rest or with exertion, wheezes GastrointestinaI: No nausea, vomiting, diarrhea, abdominal pain, fecal incontinence Genitourinary:  No dysuria, urinary retention or frequency Musculoskeletal:  No neck pain, back pain Integumentary: No rash, pruritus,  skin lesions Neurological: as above Psychiatric: No depression, insomnia, anxiety Endocrine: No palpitations, fatigue, diaphoresis, mood swings, change in appetite, change in weight, increased thirst Hematologic/Lymphatic:  No anemia, purpura, petechiae. Allergic/Immunologic: no itchy/runny eyes, nasal congestion, recent allergic reactions, rashes  PHYSICAL EXAM: Filed Vitals:   01/13/15 1137  BP: 140/70  Pulse: 68  Resp: 16   General: No acute distress Head:  Normocephalic/atraumatic Eyes:  Fundoscopic exam unremarkable without vessel changes, exudates, hemorrhages or papilledema. Neck: supple, no paraspinal tenderness, full range of motion Heart:  Regular rate and rhythm Lungs:  Clear to auscultation bilaterally Back: No paraspinal tenderness Neurological Exam: alert and oriented to person, place, and time. Attention span and concentration intact, recent and remote memory intact, fund of knowledge intact.  Speech fluent and not dysarthric, language intact.  CN II-XII intact. Fundoscopic exam unremarkable without vessel changes, exudates, hemorrhages or papilledema.  Bulk and tone normal, muscle strength 5/5 throughout.  Sensation to light touch, temperature and  vibration intact.  Deep tendon reflexes 2+ throughout, toes downgoing.  Finger to nose and heel to shin testing intact.  Gait normal, Romberg negative.  IMPRESSION: Another episode of blacking out.  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 Migraine without aura, worsening  PLAN: 1.  Will increase Keppra to 750mg  twice daily.  Will start immediately 2.  Increase nortriptyline to 50mg  at bedtime.  In order to minimize side effects, will have her start this dose in one week. 3.  Relpax 40mg  for abortive migraine treatment. 4.  Informed her that as per [place] law, she is not to drive until she has had 6 months without another seizure. 5.  She has scheduled appointment in 3 months.  25 minutes spent with patient, over 50% spent discussing management.  Metta Clines, DO  CC:  Annye Asa, MD

## 2015-01-13 NOTE — Patient Instructions (Addendum)
1.  We will need to increase the Keppra.  I have put in a prescription for 750mg  pills.  Take 1 twice daily 2.  In one week, start nortriptyline 50mg  at bedtime.  I don't want to increase it at the same time as the [place] in order to avoid side effects, which is why you will begin the 50mg  capsule in one week.  CALL IN 5 WEEKS WITH UPDATE 3.  Relpax for migraine 4.  No driving as per state law 5.  Follow up at next scheduled appointment

## 2015-01-26 ENCOUNTER — Telehealth: Payer: Self-pay | Admitting: *Deleted

## 2015-01-26 NOTE — Telephone Encounter (Signed)
Patient has increased her Keppra and nortriptyline and is calling to report that all is well with this increase   Call back number [phone]

## 2015-02-14 ENCOUNTER — Other Ambulatory Visit: Payer: Self-pay | Admitting: *Deleted

## 2015-02-14 MED ORDER — ELETRIPTAN HYDROBROMIDE 40 MG PO TABS: ORAL_TABLET | ORAL | Status: DC

## 2015-02-20 ENCOUNTER — Encounter: Payer: Self-pay | Admitting: Family Medicine

## 2015-02-20 ENCOUNTER — Ambulatory Visit (INDEPENDENT_AMBULATORY_CARE_PROVIDER_SITE_OTHER): Payer: Medicare Other | Admitting: Family Medicine

## 2015-02-20 VITALS — BP 136/80 | HR 82 | Temp 98.0°F | Resp 16 | Wt 195.2 lb

## 2015-02-20 DIAGNOSIS — G4762 Sleep related leg cramps: Secondary | ICD-10-CM

## 2015-02-20 DIAGNOSIS — J0101 Acute recurrent maxillary sinusitis: Secondary | ICD-10-CM | POA: Diagnosis not present

## 2015-02-20 HISTORY — DX: Sleep related leg cramps: G47.62

## 2015-02-20 LAB — BASIC METABOLIC PANEL
BUN: 21 mg/dL (ref 6–23)
CO2: 25 mEq/L (ref 19–32)
Calcium: 8.8 mg/dL (ref 8.4–10.5)
Chloride: 105 mEq/L (ref 96–112)
Creatinine, Ser: 0.69 mg/dL (ref 0.40–1.20)
GFR: 92.65 mL/min (ref >60.00)
Glucose, Bld: 80 mg/dL (ref 70–99)
Potassium: 3.7 mEq/L (ref 3.5–5.1)
Sodium: 139 mEq/L (ref 135–145)

## 2015-02-20 MED ORDER — AMOXICILLIN 875 MG PO TABS: 875.0000 mg | ORAL_TABLET | Freq: Two times a day (BID) | ORAL | Status: DC

## 2015-02-20 NOTE — Assessment & Plan Note (Signed)
New.  Stressed need for increased water intake and decreased soda intake.  Check BMP to r/o electrolyte abnormality.  Reviewed supportive care and red flags that should prompt return.  Pt expressed understanding and is in agreement w/ plan.

## 2015-02-20 NOTE — Progress Notes (Signed)
Pre visit review using our clinic review tool, if applicable. No additional management support is needed unless otherwise documented below in the visit note. 

## 2015-02-20 NOTE — Progress Notes (Signed)
   Subjective:    Patient ID: Lindsay Frost, female    DOB: May 13, 1956, 59 y.o.   MRN: [id number]  HPI URI- sxs started 3 days ago w/ nasal congestion, 'really bad headache for 3 days'.  Had nosebleeds yesterday.  + dizziness.  No fever.  + sinus pain/pressure.  No ear pain.  Minimal cough.  No known sick contacts.  Nocturnal leg cramps- sxs started 'a couple of weeks ago'.  Not drinking water.  Drinking a lot of soda.  Pt is worried about her potassium.   Review of Systems For ROS see HPI     Objective:   Physical Exam  Constitutional: She is oriented to person, place, and time. She appears well-developed and well-nourished. No distress.  HENT:  Head: Normocephalic and atraumatic.  Right Ear: Tympanic membrane normal.  Left Ear: Tympanic membrane normal.  Nose: Mucosal edema and rhinorrhea present. Right sinus exhibits maxillary sinus tenderness and frontal sinus tenderness. Left sinus exhibits maxillary sinus tenderness and frontal sinus tenderness.  Mouth/Throat: Uvula is midline and mucous membranes are normal. Posterior oropharyngeal erythema present. No oropharyngeal exudate.  Eyes: Conjunctivae and EOM are normal. Pupils are equal, round, and reactive to light.  Neck: Normal range of motion. Neck supple.  Cardiovascular: Normal rate, regular rhythm, normal heart sounds and intact distal pulses.   Pulmonary/Chest: Effort normal and breath sounds normal. No respiratory distress. She has no wheezes.  Musculoskeletal: She exhibits no edema or tenderness.  Lymphadenopathy:    She has no cervical adenopathy.  Neurological: She is alert and oriented to person, place, and time.  Skin: Skin is warm and dry.  Psychiatric: She has a normal mood and affect. Her behavior is normal.  Vitals reviewed.         Assessment & Plan:

## 2015-02-20 NOTE — Assessment & Plan Note (Signed)
Recurrent issue for pt.  sxs and PE consistent w/ infxn.  Start abx.  Reviewed supportive care and red flags that should prompt return.  Pt expressed understanding and is in agreement w/ plan.

## 2015-02-20 NOTE — Patient Instructions (Signed)
Follow up as needed We'll notify you of your lab results and make any changes if needed Start the Amoxicillin twice daily- take w/ food Drink plenty of water- try and limit your soda intake Call with any questions or concerns Hang in there!!!

## 2015-02-28 ENCOUNTER — Encounter: Payer: Self-pay | Admitting: Neurology

## 2015-02-28 ENCOUNTER — Ambulatory Visit (INDEPENDENT_AMBULATORY_CARE_PROVIDER_SITE_OTHER): Payer: Medicare Other | Admitting: Neurology

## 2015-02-28 VITALS — BP 128/78 | HR 104 | Ht 64.0 in | Wt 194.0 lb

## 2015-02-28 DIAGNOSIS — R208 Other disturbances of skin sensation: Secondary | ICD-10-CM

## 2015-02-28 DIAGNOSIS — G43009 Migraine without aura, not intractable, without status migrainosus: Secondary | ICD-10-CM

## 2015-02-28 DIAGNOSIS — R200 Anesthesia of skin: Secondary | ICD-10-CM

## 2015-02-28 DIAGNOSIS — G40209 Localization-related (focal) (partial) symptomatic epilepsy and epileptic syndromes with complex partial seizures, not intractable, without status epilepticus: Secondary | ICD-10-CM | POA: Diagnosis not present

## 2015-02-28 NOTE — Patient Instructions (Signed)
To evaluate the right foot numbness and cramping, we will get a nerve study of the right lower extremity Continue nortriptyline 50mg  at bedtime Take Relpax 40mg  as needed for headache Follow up in 3 months.
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
## 2015-03-28 DIAGNOSIS — M25562 Pain in left knee: Secondary | ICD-10-CM | POA: Diagnosis not present

## 2015-03-30 ENCOUNTER — Other Ambulatory Visit (HOSPITAL_COMMUNITY): Payer: Self-pay | Admitting: Orthopedic Surgery

## 2015-03-30 DIAGNOSIS — M9712XA Periprosthetic fracture around internal prosthetic left knee joint, initial encounter: Secondary | ICD-10-CM

## 2015-04-03 ENCOUNTER — Encounter (HOSPITAL_COMMUNITY)
Admission: RE | Admit: 2015-04-03 | Discharge: 2015-04-03 | Disposition: A | Discharge status: Home / Self Care | Payer: Medicare Other | Source: Ambulatory Visit | Attending: Orthopedic Surgery | Admitting: Orthopedic Surgery

## 2015-04-03 DIAGNOSIS — Z96652 Presence of left artificial knee joint: Secondary | ICD-10-CM | POA: Diagnosis not present

## 2015-04-03 DIAGNOSIS — M9712XA Periprosthetic fracture around internal prosthetic left knee joint, initial encounter: Secondary | ICD-10-CM

## 2015-04-03 DIAGNOSIS — M25562 Pain in left knee: Secondary | ICD-10-CM | POA: Diagnosis not present

## 2015-04-03 DIAGNOSIS — T84043A Periprosthetic fracture around internal prosthetic left knee joint, initial encounter: Secondary | ICD-10-CM | POA: Diagnosis not present

## 2015-04-03 DIAGNOSIS — X58XXXA Exposure to other specified factors, initial encounter: Secondary | ICD-10-CM | POA: Diagnosis not present

## 2015-04-03 DIAGNOSIS — Z96653 Presence of artificial knee joint, bilateral: Secondary | ICD-10-CM | POA: Diagnosis not present

## 2015-04-03 MED ORDER — TECHNETIUM TC 99M MEDRONATE IV KIT
25.0000 | PACK | Freq: Once | INTRAVENOUS | Status: AC | PRN
  Administered 2015-04-03: 25 via INTRAVENOUS

## 2015-04-04 ENCOUNTER — Ambulatory Visit (INDEPENDENT_AMBULATORY_CARE_PROVIDER_SITE_OTHER): Payer: Medicare Other | Admitting: Neurology

## 2015-04-04 DIAGNOSIS — G40209 Localization-related (focal) (partial) symptomatic epilepsy and epileptic syndromes with complex partial seizures, not intractable, without status epilepticus: Secondary | ICD-10-CM

## 2015-04-04 DIAGNOSIS — R200 Anesthesia of skin: Secondary | ICD-10-CM

## 2015-04-04 DIAGNOSIS — M5417 Radiculopathy, lumbosacral region: Secondary | ICD-10-CM

## 2015-04-04 DIAGNOSIS — G43009 Migraine without aura, not intractable, without status migrainosus: Secondary | ICD-10-CM

## 2015-04-04 NOTE — Procedures (Signed)
[facility] Neurology  [address], [address]  [place], [place] [address] Tel: [phone] Fax:  [phone] Test Date:  04/04/2015  Patient: Lindsay Frost DOB: 1956/01/17 Physician: Narda Amber  Sex: Female Height: 5\' 4"  Ref Phys: Metta Clines, M.D.  ID#: [id number] Temp: 33.8C Technician: Jerilynn Mages. Dean   Patient Complaints: This is a 59 year-old female presenting for evaluation of right foot numbness.  NCV & EMG Findings: Extensive electrodiagnostic testing of the right lower extremity shows:  1. Right sural and superficial peroneal sensory responses are within normal limits. 2. Right peroneal and tibial motor responses are within normal limits. 3. Sparse chronic motor axon loss changes are seen affecting the L5 myotomes, without accompanied active denervation.  Impression: 1. Chronic L5 radiculopathy affecting the right lower extremity; very mild in degree electrically. 2. There is no evidence of a generalized sensorimotor polyneuropathy.   ___________________________ Narda Amber    Nerve Conduction Studies Anti Sensory Summary Table   Stim Site NR Peak (ms) Norm Peak (ms) P-T Amp (V) Norm P-T Amp  Right Sup Peroneal Anti Sensory (Ant Lat Mall)  33.8C  12 cm    3.1 <4.6 7.2 >4  Right Sural Anti Sensory (Lat Mall)  33.8C  Calf    3.0 <4.6 5.7 >4   Motor Summary Table   Stim Site NR Onset (ms) Norm Onset (ms) O-P Amp (mV) Norm O-P Amp Site1 Site2 Delta-0 (ms) Dist (cm) Vel (m/s) Norm Vel (m/s)  Right Peroneal Motor (Ext Dig Brev)  33.8C  Ankle    3.8 <6.0 6.4 >2.5 B Fib Ankle 5.8 29.0 50 >40  B Fib    9.6  6.0  Poplt B Fib 1.8 10.0 56 >40  Poplt    11.4  6.1         Right Peroneal TA Motor (Tib Ant)  33.8C  Fib Head    3.6 <4.5 3.5 >3 Poplit Fib Head 1.2 8.0 67 >40  Poplit    4.8  3.4         Right Tibial Motor (Abd Hall Brev)  33.8C  Ankle    2.9 <6.0 8.6 >4 Knee Ankle 8.4 39.0 46 >40  Knee    11.3  4.7          F Wave Studies   NR F-Lat (ms) Lat Norm  (ms) L-R F-Lat (ms)  Right Tibial (Mrkrs) (Abd Hallucis)  33.8C     50.15 <55    H Reflex Studies   NR H-Lat (ms) Lat Norm (ms) L-R H-Lat (ms) M-Lat (ms) HLat-MLat (ms)  Right Tibial (Gastroc)  33.8C     31.97 <35  3.81 28.16   EMG   Side Muscle Ins Act Fibs Psw Fasc Number Recrt Dur Dur. Amp Amp. Poly Poly. Comment  Right AntTibialis Nml Nml Nml Nml 1- Mod-R Few 1+ Few 1+ Nml Nml N/A  Right Gastroc Nml Nml Nml Nml Nml Nml Nml Nml Nml Nml Nml Nml N/A  Right Flex Dig Long Nml Nml Nml Nml 2- Mod-R Some 1+ Some 1+ Nml Nml N/A  Right RectFemoris Nml Nml Nml Nml Nml Nml Nml Nml Nml Nml Nml Nml N/A  Right GluteusMed Nml Nml Nml Nml 1- Mod-R Few 1+ Few 1+ Nml Nml N/A  Right BicepsFemS Nml Nml Nml Nml Nml Nml Nml Nml Nml Nml Nml Nml N/A      Waveforms:

## 2015-04-11 ENCOUNTER — Other Ambulatory Visit: Payer: Self-pay | Admitting: Family Medicine

## 2015-04-11 NOTE — Telephone Encounter (Signed)
Patient requesting Cymbalta, this is a historical med prescribed by [facility] hospitalist.    Lindsay Frost 02/20/15 for leg cramps/sinusitis CPE 07/22/14  Please advise if OK to refill

## 2015-04-20 DIAGNOSIS — Z09 Encounter for follow-up examination after completed treatment for conditions other than malignant neoplasm: Secondary | ICD-10-CM | POA: Diagnosis not present

## 2015-04-20 DIAGNOSIS — M25551 Pain in right hip: Secondary | ICD-10-CM | POA: Diagnosis not present

## 2015-04-20 DIAGNOSIS — Z96651 Presence of right artificial knee joint: Secondary | ICD-10-CM | POA: Diagnosis not present

## 2015-04-20 DIAGNOSIS — Z96652 Presence of left artificial knee joint: Secondary | ICD-10-CM | POA: Diagnosis not present

## 2015-05-30 ENCOUNTER — Telehealth: Payer: Self-pay | Admitting: Neurology

## 2015-05-30 MED ORDER — LEVETIRACETAM 1000 MG PO TABS: 1000.0000 mg | ORAL_TABLET | Freq: Two times a day (BID) | ORAL | Status: DC

## 2015-05-30 NOTE — Telephone Encounter (Signed)
Spoke with pt. Pt.states that this was not a witnessed seizure she states that she lives alone. She stated that when she woke up she felt like she couldn't catch her breath and it took her minutes to recover. She stated that she then went to the bathroom and looked in the mirror and reports that her lips were "gray". She states that she was panicky and had felt like someone hit her and she lost her breath. I advised her that we sent in the new dosage of Keppra and advised that if this occurred again she needed to seek medical attention. She stated that this was the scariest seizure she has had and thought she was having " a come to [employer].

## 2015-05-30 NOTE — Telephone Encounter (Signed)
Spoke with pt. Pt. States that 3 nights ago she had a "pretty big" seizure. She stated that she "quit breathing for a few minutes" and then the next day she was very confused and groggy. She stated that she has not missed any dosages of her Keppra but that this seizure has really scared her and she wanted to know if she should increase her Keppra dosage? Thanks

## 2015-05-30 NOTE — Telephone Encounter (Signed)
Pt called and said she was having bad seizures and wanted to know if she should increase her Keppra/Dawn CB# [phone]

## 2015-05-30 NOTE — Telephone Encounter (Signed)
I would increase Keppra to 1000mg  twice daily.  Was this seizure witnessed?  How did she know she stopped breathing for a few minutes?  She should note that if she has a seizure lasting over  5 minutes or if she does not return to baseline within a few minutes, she should go to the ED.

## 2015-06-14 ENCOUNTER — Encounter: Payer: Self-pay | Admitting: Neurology

## 2015-06-14 ENCOUNTER — Ambulatory Visit (INDEPENDENT_AMBULATORY_CARE_PROVIDER_SITE_OTHER): Payer: Medicare Other | Admitting: Neurology

## 2015-06-14 VITALS — BP 122/78 | HR 88 | Ht 64.0 in | Wt 197.8 lb

## 2015-06-14 DIAGNOSIS — G40209 Localization-related (focal) (partial) symptomatic epilepsy and epileptic syndromes with complex partial seizures, not intractable, without status epilepticus: Secondary | ICD-10-CM

## 2015-06-14 DIAGNOSIS — D329 Benign neoplasm of meninges, unspecified: Secondary | ICD-10-CM

## 2015-06-14 DIAGNOSIS — G43009 Migraine without aura, not intractable, without status migrainosus: Secondary | ICD-10-CM | POA: Diagnosis not present

## 2015-06-14 NOTE — Patient Instructions (Addendum)
1.  We will repeat MRI of the brain with and without contrast to re-examine the meningioma 2.  Continue Keppra 1000mg  twice daily 3.  Continue nortriptyline 50mg  at bedtime.  Use Relpax as needed for headache 4.  No driving until 11/28/92, unless you have another seizure 5.  Follow up in 6 months.  MRI/Brain scheduled at [place] location on 06/29/2015 at 7:30 pm arrive at 7:10 pm

## 2015-06-14 NOTE — Addendum Note (Signed)
Addended by: Oda Kilts on: 06/14/2015 03:42 PM   Modules accepted: Orders

## 2015-06-14 NOTE — Progress Notes (Signed)
NEUROLOGY FOLLOW UP OFFICE NOTE  [name] [id number]  HISTORY OF PRESENT ILLNESS: Lindsay Frost is a 59 year old right-handed woman with history of depression, migraine, and GERD who follows up for migraine without aura and seizure.  Labs reviewed.  UPDATE: Migraine: Stable Duration:  3 hours with Relpax Frequency:  3 times a month Current abortive therapy: relpax 40mg  Current preventative therapy:  nortriptyline 50mg  at bedtime Other current medications:  Cymbalta 60mg  twice daily  Seizure: Last spell occurred 05/27/15.  It was unwitnessed.  This spell was different.  When she woke up, she was short of breath and it took a few minutes to recover.  Sometimes, she will wake up short of breath, but this episode was worse.  She did not have incontinence or tongue biting.  Keppra was increased from 750mg  twice daily to 1000mg  twice daily.  Beginning in May, she noted numbness over the dorsum of her toes in her right foot.  There is also associated cramping in the right foot at night time.  She denies symptoms involving the left leg.  She denies back pain or radicular pain down the leg.  She says she sometimes trips because of the numbness in the foot.  She says she does not cross her legs.  NCV-EMG performed on 04/04/15 demonstrated mild chronic L5 radiculopathy.  The numbness has since resolved.  HISTORY: Migraine: Onset:  Early 40s Location:  Left frontal Quality:  Stabbing Initial Intensity:  10 out of 10; June 7/10 Aura:  No Prodrome:  Photophobia Associated symptoms:  Nausea, phonophobia, photophobia, sometimes vomiting. No osmophobia, visual disturbance, or autonomic symptoms. Initial Duration:  2-3 days; June 3 hours with Relpax Initial Frequency:  Migraines occur every 2 weeks (4-6 days per month) and dull headaches occur 2 times a week (8 days per month). Total of 14 headache days per month; June 3 days per month. Triggers/exacerbating factors:  Heat Relieving factors:   Laying down in a quiet, dark, cool room with ice pack. Activity:  Cannot function when she has migraine  Past abortive therapy:  sumatriptan 50mg  (ineffective, caused racing heart), Maxalt (ineffective), BC powder Past preventative therapy:  Topamax (side effects, ineffective) Unable to start Inderal LA because insurance wouldn't cover it.  Caffeine:  Coffee, tea, soda Alcohol:  no Smoker:  no Diet:  Junk food Exercise:  Walks dog Depression/stress:  controlled Sleep hygiene:  Good.  Sleeps 10 hours, which she feels is too much (particularly if she has a migraine) Family history of headache:  daughter  08/19/13 MRI BRAIN W/WO:  12 mm left parietal calcified meningioma without surrounding vasogenic edema, minimally increased compared to prior study from 10/2003. MRI of the brain with and without contrast was performed on 06/08/14, which demonstrated stability of the meningioma.  Seizure-like episodes: Her Wellbutrin was increased in August to help control her depression.    At around that time, she had an episode where she woke up and found herself on the bathroom floor.  She didn't remember falling.  Later in the month, she had another episode where she woke up on the floor next to her bed and noted that she was briefly kicking both legs.  She had a severe rug burn on the left side of her face.  She did not bite her tongue during either event but she did have some urinary incontinence during the episode in the bathroom.  Her PCP, Dr. Birdie Riddle, subsequently decreased her dose of Wellbutrin.  She has no prior  history of seizures.  She had a sleep deprived EEG performed on 06/07/14, which showed brief intermittent transient sharp waves and slowing in the left temporal region, which although not epileptiform, could be a seizure focus.  PAST MEDICAL HISTORY: Past Medical History  Diagnosis Date  . Headache(784.0)     migraines  . Arthritis   . History of migraine     last one about 2wks ago-takes  Imitrex prn  . Joint pain   . Joint swelling   . Back pain     arthritis  . GERD (gastroesophageal reflux disease)     takes Omeprazole daily  . History of gastric ulcer   . Depression     takes cymbalta daily  . Ulcer   . Heart murmur   . Meningioma     MEDICATIONS: Current Outpatient Prescriptions on File Prior to Visit  Medication Sig Dispense Refill  . amoxicillin (AMOXIL) 875 MG tablet Take 1 tablet (875 mg total) by mouth 2 (two) times daily. 20 tablet 0  . atorvastatin (LIPITOR) 20 MG tablet Take 1 tablet (20 mg total) by mouth daily. 30 tablet 6  . DULoxetine (CYMBALTA) 60 MG capsule Take 60 mg by mouth 2 (two) times daily.    Marland Kitchen HYDROcodone-acetaminophen (NORCO/VICODIN) 5-325 MG per tablet 1 tablet. PRN  0  . ibuprofen (ADVIL,MOTRIN) 200 MG tablet Take 800 mg by mouth every 6 (six) hours as needed (Pain).    Marland Kitchen levETIRAcetam (KEPPRA) 1000 MG tablet Take 1 tablet (1,000 mg total) by mouth 2 (two) times daily. 60 tablet 3  . nortriptyline (PAMELOR) 50 MG capsule Take 1 capsule (50 mg total) by mouth at bedtime. 30 capsule 1  . omeprazole (PRILOSEC) 40 MG capsule Take 40 mg by mouth 2 (two) times daily.    . RELPAX 40 MG tablet   0  . rizatriptan (MAXALT) 10 MG tablet Take 10 mg by mouth every 2 (two) hours as needed for migraine. May repeat in 2 hours if needed    . sucralfate (CARAFATE) 1 G tablet Take 1 g by mouth 4 (four) times daily.     No current facility-administered medications on file prior to visit.    ALLERGIES: No Known Allergies  FAMILY HISTORY: Family History  Problem Relation Age of Onset  . Arthritis Mother   . Cancer Mother     breast  . Heart disease Mother   . Depression Mother   . Heart disease Father   . Kidney disease Father     SOCIAL HISTORY: Social History   Social History  . Marital Status: Single    Spouse Name: N/A  . Number of Children: N/A  . Years of Education: N/A   Occupational History  . Not on file.   Social History  Main Topics  . Smoking status: Never Smoker   . Smokeless tobacco: Never Used  . Alcohol Use: No  . Drug Use: No  . Sexual Activity: No   Other Topics Concern  . Not on file   Social History Narrative    REVIEW OF SYSTEMS: Constitutional: No fevers, chills, or sweats, no generalized fatigue, change in appetite Eyes: No visual changes, double vision, eye pain Ear, nose and throat: No hearing loss, ear pain, nasal congestion, sore throat Cardiovascular: No chest pain, palpitations Respiratory:  No shortness of breath at rest or with exertion, wheezes GastrointestinaI: No nausea, vomiting, diarrhea, abdominal pain, fecal incontinence Genitourinary:  No dysuria, urinary retention or frequency Musculoskeletal:  No neck pain,  back pain Integumentary: No rash, pruritus, skin lesions Neurological: as above Psychiatric: No depression, insomnia, anxiety Endocrine: No palpitations, fatigue, diaphoresis, mood swings, change in appetite, change in weight, increased thirst Hematologic/Lymphatic:  No anemia, purpura, petechiae. Allergic/Immunologic: no itchy/runny eyes, nasal congestion, recent allergic reactions, rashes  PHYSICAL EXAM: Filed Vitals:   06/14/15 1407  BP: 122/78  Pulse: 88   General: No acute distress.   Head:  Normocephalic/atraumatic Eyes:  Fundoscopic exam unremarkable without vessel changes, exudates, hemorrhages or papilledema. Neck: supple, no paraspinal tenderness, full range of motion Heart:  Regular rate and rhythm Lungs:  Clear to auscultation bilaterally Back: No paraspinal tenderness Neurological Exam: alert and oriented to person, place, and time. Attention span and concentration intact, recent and remote memory intact, fund of knowledge intact.  Speech fluent and not dysarthric, language intact.  CN II-XII intact. Fundoscopic exam unremarkable without vessel changes, exudates, hemorrhages or papilledema.  Bulk and tone normal, muscle strength 5/5 throughout.   Sensation to light touch intact.  Deep tendon reflexes 2+ throughout.  Finger to nose and heel to shin testing intact.  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  Unclear if recent spell from earlier this week was seizure.  My suspicion for seizure is low. Meningioma  PLAN: 1.  Repeat MRI of brain with and without contrast to evaluate meningioma.  If stable, would not repeat imaging any longer unless clinically indicated 2.  Continue Keppra 1000mg  twice daily.  As long as she is seizure-free, no driving until 78/67/54. 3.  Continue nortriptyline and Relpax for migraine 4.  Follow up in 6 months.  Metta Clines, DO  CC: Annye Asa, MD

## 2015-06-15 ENCOUNTER — Encounter: Payer: Self-pay | Admitting: Family Medicine

## 2015-06-15 ENCOUNTER — Ambulatory Visit (INDEPENDENT_AMBULATORY_CARE_PROVIDER_SITE_OTHER): Payer: Medicare Other | Admitting: Family Medicine

## 2015-06-15 VITALS — BP 120/80 | HR 73 | Temp 98.0°F | Resp 16 | Wt 198.2 lb

## 2015-06-15 DIAGNOSIS — J0100 Acute maxillary sinusitis, unspecified: Secondary | ICD-10-CM

## 2015-06-15 MED ORDER — PROMETHAZINE-DM 6.25-15 MG/5ML PO SYRP: 5.0000 mL | ORAL_SOLUTION | Freq: Four times a day (QID) | ORAL | Status: DC | PRN

## 2015-06-15 MED ORDER — AMOXICILLIN 875 MG PO TABS: 875.0000 mg | ORAL_TABLET | Freq: Two times a day (BID) | ORAL | Status: DC

## 2015-06-15 NOTE — Progress Notes (Signed)
   Subjective:    Patient ID: Lindsay Frost, female    DOB: 1956-08-22, 59 y.o.   MRN: [id number]  HPI URI- sxs started 4 days ago w/ nasal congestion, sore throat.  Difficulty breathing at night, 'sinus drainage when I lay down'.  + cough- productive of yellow mucous.  No fevers.  No known sick contacts.  No N/V.  + sinus pain/pressure.  + HA.   Review of Systems For ROS see HPI     Objective:   Physical Exam  Constitutional: She appears well-developed and well-nourished. No distress.  HENT:  Head: Normocephalic and atraumatic.  Right Ear: Tympanic membrane normal.  Left Ear: Tympanic membrane normal.  Nose: Mucosal edema and rhinorrhea present. Right sinus exhibits maxillary sinus tenderness and frontal sinus tenderness. Left sinus exhibits maxillary sinus tenderness and frontal sinus tenderness.  Mouth/Throat: Uvula is midline and mucous membranes are normal. Posterior oropharyngeal erythema present. No oropharyngeal exudate.  Eyes: Conjunctivae and EOM are normal. Pupils are equal, round, and reactive to light.  Neck: Normal range of motion. Neck supple.  Cardiovascular: Normal rate, regular rhythm and normal heart sounds.   Pulmonary/Chest: Effort normal and breath sounds normal. No respiratory distress. She has no wheezes.  Lymphadenopathy:    She has no cervical adenopathy.          Assessment & Plan:

## 2015-06-15 NOTE — Patient Instructions (Signed)
Follow up as needed Start the Amoxicillin twice daily- take w/ food Drink plenty of fluids Use the cough syrup as needed- will cause drowsiness Mucinex DM for daytime cough REST! Call with any questions or concerns Hang in there!!!

## 2015-06-15 NOTE — Assessment & Plan Note (Signed)
Pt's sxs and PE consistent w/ infxn.  Start abx.  Cough meds prn.  Reviewed supportive care and red flags that should prompt return.  Pt expressed understanding and is in agreement w/ plan.  

## 2015-06-15 NOTE — Progress Notes (Signed)
Pre visit review using our clinic review tool, if applicable. No additional management support is needed unless otherwise documented below in the visit note. 

## 2015-06-29 ENCOUNTER — Ambulatory Visit
Admission: RE | Admit: 2015-06-29 | Discharge: 2015-06-29 | Disposition: A | Discharge status: Home / Self Care | Payer: Medicare Other | Source: Ambulatory Visit | Attending: Neurology | Admitting: Neurology

## 2015-06-29 DIAGNOSIS — D329 Benign neoplasm of meninges, unspecified: Secondary | ICD-10-CM | POA: Diagnosis not present

## 2015-06-29 DIAGNOSIS — G40209 Localization-related (focal) (partial) symptomatic epilepsy and epileptic syndromes with complex partial seizures, not intractable, without status epilepticus: Secondary | ICD-10-CM

## 2015-06-29 DIAGNOSIS — G43009 Migraine without aura, not intractable, without status migrainosus: Secondary | ICD-10-CM

## 2015-06-29 DIAGNOSIS — R51 Headache: Secondary | ICD-10-CM | POA: Diagnosis not present

## 2015-06-29 MED ORDER — GADOBENATE DIMEGLUMINE 529 MG/ML IV SOLN
18.0000 mL | Freq: Once | INTRAVENOUS | Status: AC | PRN
  Administered 2015-06-29: 18 mL via INTRAVENOUS

## 2015-07-24 ENCOUNTER — Other Ambulatory Visit: Payer: Self-pay | Admitting: Neurology

## 2015-07-24 DIAGNOSIS — G43009 Migraine without aura, not intractable, without status migrainosus: Secondary | ICD-10-CM

## 2015-07-24 NOTE — Telephone Encounter (Signed)
Last OV: 06/04/15 Next OV: 01/15/16

## 2015-08-02 DIAGNOSIS — K449 Diaphragmatic hernia without obstruction or gangrene: Secondary | ICD-10-CM | POA: Diagnosis not present

## 2015-08-02 DIAGNOSIS — K219 Gastro-esophageal reflux disease without esophagitis: Secondary | ICD-10-CM | POA: Diagnosis not present

## 2015-08-02 DIAGNOSIS — K315 Obstruction of duodenum: Secondary | ICD-10-CM | POA: Diagnosis not present

## 2015-08-18 ENCOUNTER — Other Ambulatory Visit: Payer: Self-pay | Admitting: Neurology

## 2015-08-30 ENCOUNTER — Encounter: Payer: Self-pay | Admitting: Family Medicine

## 2015-08-30 ENCOUNTER — Ambulatory Visit (INDEPENDENT_AMBULATORY_CARE_PROVIDER_SITE_OTHER): Payer: Medicare Other | Admitting: Family Medicine

## 2015-08-30 VITALS — BP 130/78 | HR 76 | Temp 97.9°F | Resp 16 | Ht 64.0 in | Wt 203.1 lb

## 2015-08-30 DIAGNOSIS — J0100 Acute maxillary sinusitis, unspecified: Secondary | ICD-10-CM | POA: Diagnosis not present

## 2015-08-30 MED ORDER — AMOXICILLIN 875 MG PO TABS: 875.0000 mg | ORAL_TABLET | Freq: Two times a day (BID) | ORAL | Status: DC

## 2015-08-30 MED ORDER — PROMETHAZINE-DM 6.25-15 MG/5ML PO SYRP: 5.0000 mL | ORAL_SOLUTION | Freq: Four times a day (QID) | ORAL | Status: DC | PRN

## 2015-08-30 NOTE — Patient Instructions (Signed)
Follow up as needed Start the Amoxicillin twice daily- take w/ food Drink plenty of fluids Use the cough syrup as needed- will cause drowsiness Mucinex DM for daytime cough REST! Call with any questions or concerns If you want to join [place] at the new [place] office, any scheduled appointments will automatically transfer and we will see you at [address] [place] [address] [place], [place] [address] (OPENING 09/19/15) Happy Holidays!!!

## 2015-08-30 NOTE — Assessment & Plan Note (Signed)
Pt's sxs and PE consistent w/ infxn.  Start abx.  Cough meds prn.  Reviewed supportive care and red flags that should prompt return.  Pt expressed understanding and is in agreement w/ plan.  

## 2015-08-30 NOTE — Progress Notes (Signed)
   Subjective:    Patient ID: Lindsay Frost, female    DOB: [birth year], 59 y.o.   MRN: [id number]  HPI URI- sxs started ~1 week ago, 'with a really bad cough'.  + nasal congestion, blood tinged.  Cough is productive of 'mucous w/ a little bit of blood'.  No fevers.  + facial pain/pressure, tooth pain.  L ear pain.  Mild nausea, no vomiting.  No known sick contacts.   Review of Systems For ROS see HPI     Objective:   Physical Exam  Constitutional: She appears well-developed and well-nourished. No distress.  HENT:  Head: Normocephalic and atraumatic.  Right Ear: Tympanic membrane normal.  Left Ear: Tympanic membrane normal.  Nose: Mucosal edema and rhinorrhea present. Right sinus exhibits maxillary sinus tenderness. Right sinus exhibits no frontal sinus tenderness. Left sinus exhibits maxillary sinus tenderness. Left sinus exhibits no frontal sinus tenderness.  Mouth/Throat: Uvula is midline and mucous membranes are normal. Posterior oropharyngeal erythema present. No oropharyngeal exudate.  Eyes: Conjunctivae and EOM are normal. Pupils are equal, round, and reactive to light.  Neck: Normal range of motion. Neck supple.  Cardiovascular: Normal rate, regular rhythm and normal heart sounds.   Pulmonary/Chest: Effort normal and breath sounds normal. No respiratory distress. She has no wheezes.  Lymphadenopathy:    She has no cervical adenopathy.  Vitals reviewed.         Assessment & Plan:

## 2015-08-30 NOTE — Progress Notes (Signed)
Pre visit review using our clinic review tool, if applicable. No additional management support is needed unless otherwise documented below in the visit note. 

## 2015-09-04 ENCOUNTER — Ambulatory Visit (HOSPITAL_COMMUNITY)
Admission: RE | Admit: 2015-09-04 | Discharge: 2015-09-04 | Disposition: A | Discharge status: Home / Self Care | Payer: Medicare Other | Source: Ambulatory Visit | Attending: Gastroenterology | Admitting: Gastroenterology

## 2015-09-04 ENCOUNTER — Encounter (HOSPITAL_COMMUNITY)
Admission: RE | Disposition: A | Discharge status: Home / Self Care | Payer: Self-pay | Source: Ambulatory Visit | Attending: Gastroenterology

## 2015-09-04 DIAGNOSIS — K449 Diaphragmatic hernia without obstruction or gangrene: Secondary | ICD-10-CM | POA: Diagnosis not present

## 2015-09-04 DIAGNOSIS — F329 Major depressive disorder, single episode, unspecified: Secondary | ICD-10-CM | POA: Diagnosis not present

## 2015-09-04 DIAGNOSIS — K219 Gastro-esophageal reflux disease without esophagitis: Secondary | ICD-10-CM | POA: Diagnosis not present

## 2015-09-04 DIAGNOSIS — G43909 Migraine, unspecified, not intractable, without status migrainosus: Secondary | ICD-10-CM | POA: Diagnosis not present

## 2015-09-04 DIAGNOSIS — Z538 Procedure and treatment not carried out for other reasons: Secondary | ICD-10-CM | POA: Insufficient documentation

## 2015-09-04 DIAGNOSIS — R05 Cough: Secondary | ICD-10-CM | POA: Diagnosis not present

## 2015-09-04 DIAGNOSIS — Z96653 Presence of artificial knee joint, bilateral: Secondary | ICD-10-CM | POA: Insufficient documentation

## 2015-09-04 DIAGNOSIS — M479 Spondylosis, unspecified: Secondary | ICD-10-CM | POA: Insufficient documentation

## 2015-09-04 HISTORY — PX: ESOPHAGEAL MANOMETRY: SHX5429

## 2015-09-04 HISTORY — PX: PH IMPEDANCE STUDY: SHX5565

## 2015-09-04 SURGERY — MANOMETRY, ESOPHAGUS

## 2015-09-04 MED ORDER — LIDOCAINE VISCOUS 2 % MT SOLN
OROMUCOSAL | Status: AC
  Filled 2015-09-04: qty 15

## 2015-09-04 SURGICAL SUPPLY — 2 items
FACESHIELD LNG OPTICON STERILE (SAFETY) IMPLANT
GLOVE BIO SURGEON STRL SZ8 (GLOVE) ×4 IMPLANT

## 2015-09-05 ENCOUNTER — Encounter (HOSPITAL_COMMUNITY): Payer: Self-pay | Admitting: Gastroenterology

## 2015-09-05 NOTE — H&P (Signed)
  Lindsay Frost HPI: A couple of months ago she started to "gag" she she had to spit up. She also had an episode of nocturnal waking and coughing. Regurgitation occurred. A few nights ago she had similar sympoms. Last year she was diagnosed with a healing antral ulcer, deformed duodenum, and 3 cm hiatal hernia. With these nocturnal symptoms she will have some SOB. On a routine basis she does take omeprazole BID and most of the time it is 20 minutes before PO intake.  Past Medical History  Diagnosis Date  . Headache(784.0)     migraines  . Arthritis   . History of migraine     last one about 2wks ago-takes Imitrex prn  . Joint pain   . Joint swelling   . Back pain     arthritis  . GERD (gastroesophageal reflux disease)     takes Omeprazole daily  . History of gastric ulcer   . Depression     takes cymbalta daily  . Ulcer   . Heart murmur   . Meningioma [facility])     Past Surgical History  Procedure Laterality Date  . Replacement total knee Right 37yrs ago  . Esophagogastroduodenoscopy  04/03/2012    Procedure: ESOPHAGOGASTRODUODENOSCOPY (EGD);  Surgeon: Beryle Beams, MD;  Location: [facility];  Service: Endoscopy;  Laterality: N/A;  EGD w/ balloon dilation  . Balloon dilation  04/03/2012    Procedure: BALLOON DILATION;  Surgeon: Beryle Beams, MD;  Location: [facility];  Service: Endoscopy;  Laterality: N/A;  . Diagnostic laparoscopy  35yrs ago  . Colonoscopy    . Total knee arthroplasty Left 11/30/2012    Procedure: LEFT TOTAL KNEE ARTHROPLASTY;  Surgeon: Kerin Salen, MD;  Location: [facility];  Service: Orthopedics;  Laterality: Left;  . Esophagogastroduodenoscopy (egd) with propofol N/A 02/18/2014    Procedure: ESOPHAGOGASTRODUODENOSCOPY (EGD) WITH PROPOFOL;  Surgeon: Beryle Beams, MD;  Location: [facility];  Service: Endoscopy;  Laterality: N/A;  . Esophagogastroduodenoscopy (egd) with propofol N/A 04/22/2014    Procedure: ESOPHAGOGASTRODUODENOSCOPY (EGD) WITH PROPOFOL;   Surgeon: Beryle Beams, MD;  Location: [facility];  Service: Endoscopy;  Laterality: N/A;  . Esophageal manometry N/A 09/04/2015    Procedure: ESOPHAGEAL MANOMETRY (EM);  Surgeon: Carol Ada, MD;  Location: [facility];  Service: Endoscopy;  Laterality: N/A;  . Ph impedance study N/A 09/04/2015    Procedure: [place] IMPEDANCE STUDY;  Surgeon: Carol Ada, MD;  Location: [facility];  Service: Endoscopy;  Laterality: N/A;    Family History  Problem Relation Age of Onset  . Arthritis Mother   . Cancer Mother     breast  . Heart disease Mother   . Depression Mother   . Heart disease Father   . Kidney disease Father     Social History:  reports that she has never smoked. She has never used smokeless tobacco. She reports that she does not drink alcohol or use illicit drugs.  Allergies: No Known Allergies  Medications: Scheduled: Continuous:  No results found for this or any previous visit (from the past 24 hour(s)).   No results found.  ROS:  As stated above in the HPI otherwise negative.  Height 5\' 4"  (1.626 m).    PE: Nursing procedure.  Assessment/Plan: 1) Manometry and pH Impedence.  Kaiel Weide D 09/05/2015, 2:02 PM

## 2015-10-23 ENCOUNTER — Telehealth: Payer: Self-pay | Admitting: Neurology

## 2015-10-23 ENCOUNTER — Other Ambulatory Visit: Payer: Self-pay

## 2015-10-23 DIAGNOSIS — G43009 Migraine without aura, not intractable, without status migrainosus: Secondary | ICD-10-CM

## 2015-10-23 MED ORDER — ELETRIPTAN HYDROBROMIDE 40 MG PO TABS
40.0000 mg | ORAL_TABLET | Freq: Once | ORAL | Status: DC
Start: 2015-10-23 — End: 2016-04-15

## 2015-10-23 NOTE — Progress Notes (Signed)
Relpax order submitted. P.A. initiated on [email or web address]

## 2015-10-23 NOTE — Telephone Encounter (Signed)
Pat called and has had a migraine since yesterday. Needs her Relpax refilled.

## 2015-10-23 NOTE — Telephone Encounter (Signed)
RX refilled this morning. Patient aware.

## 2015-10-27 ENCOUNTER — Telehealth: Payer: Self-pay | Admitting: Neurology

## 2015-10-27 NOTE — Telephone Encounter (Signed)
Pt needs to talk to some one about medication please call [phone]

## 2015-10-27 NOTE — Telephone Encounter (Signed)
Pa initiated for Relax via [place] tracks @ [phone]. PA# [id number]

## 2015-11-02 ENCOUNTER — Telehealth: Payer: Self-pay | Admitting: Neurology

## 2015-11-02 NOTE — Telephone Encounter (Signed)
PT called and has questions about her medication/Dawn CB# [phone]

## 2015-11-02 NOTE — Telephone Encounter (Signed)
Needs Relpax refilled but her insurance will not pay for it.  She said that they will pay for Naratriptan.  Please advise.

## 2015-11-02 NOTE — Telephone Encounter (Signed)
See if you can fill out appeal form for relpax since she has been taking in the past; doesn't make sense to change.  Plus, in my opinion, amerge is less effective and generally meant for menstrual migraine.  Find out also from patient if she has used imitrex in past

## 2015-11-06 NOTE — Telephone Encounter (Signed)
Call patient to see if she has gotten a denial letter.

## 2015-11-07 NOTE — Telephone Encounter (Signed)
She can come in and pick up a sample of the Zomig 5mg  nasal spray (1 spray in one nostril, may repeat once in 2 hours if needed)  It appears that her insurance will not be approving anything else at this point.

## 2015-11-07 NOTE — Telephone Encounter (Signed)
I spoke with patient and she is going to appeal this.  She will bring a copy of the denial letter and I will also appeal for her.  Is there anything that we can call in for now?

## 2015-11-07 NOTE — Telephone Encounter (Signed)
Left message on machine for pt to return call to the office.  

## 2015-12-05 DIAGNOSIS — Z96652 Presence of left artificial knee joint: Secondary | ICD-10-CM | POA: Diagnosis not present

## 2015-12-05 DIAGNOSIS — Z09 Encounter for follow-up examination after completed treatment for conditions other than malignant neoplasm: Secondary | ICD-10-CM | POA: Diagnosis not present

## 2015-12-05 DIAGNOSIS — Z96651 Presence of right artificial knee joint: Secondary | ICD-10-CM | POA: Diagnosis not present

## 2015-12-05 DIAGNOSIS — M7052 Other bursitis of knee, left knee: Secondary | ICD-10-CM | POA: Diagnosis not present

## 2015-12-11 DIAGNOSIS — K449 Diaphragmatic hernia without obstruction or gangrene: Secondary | ICD-10-CM | POA: Diagnosis not present

## 2015-12-11 DIAGNOSIS — K219 Gastro-esophageal reflux disease without esophagitis: Secondary | ICD-10-CM | POA: Diagnosis not present

## 2015-12-19 DIAGNOSIS — K315 Obstruction of duodenum: Secondary | ICD-10-CM | POA: Diagnosis not present

## 2015-12-19 DIAGNOSIS — K208 Other esophagitis: Secondary | ICD-10-CM | POA: Diagnosis not present

## 2015-12-19 DIAGNOSIS — K2970 Gastritis, unspecified, without bleeding: Secondary | ICD-10-CM | POA: Diagnosis not present

## 2015-12-19 DIAGNOSIS — K319 Disease of stomach and duodenum, unspecified: Secondary | ICD-10-CM | POA: Diagnosis not present

## 2015-12-19 DIAGNOSIS — K209 Esophagitis, unspecified: Secondary | ICD-10-CM | POA: Diagnosis not present

## 2015-12-19 DIAGNOSIS — K2950 Unspecified chronic gastritis without bleeding: Secondary | ICD-10-CM | POA: Diagnosis not present

## 2015-12-19 DIAGNOSIS — K222 Esophageal obstruction: Secondary | ICD-10-CM | POA: Diagnosis not present

## 2015-12-19 DIAGNOSIS — K449 Diaphragmatic hernia without obstruction or gangrene: Secondary | ICD-10-CM | POA: Diagnosis not present

## 2015-12-19 DIAGNOSIS — K259 Gastric ulcer, unspecified as acute or chronic, without hemorrhage or perforation: Secondary | ICD-10-CM | POA: Diagnosis not present

## 2016-01-02 ENCOUNTER — Encounter: Payer: Self-pay | Admitting: Physician Assistant

## 2016-01-02 ENCOUNTER — Ambulatory Visit (INDEPENDENT_AMBULATORY_CARE_PROVIDER_SITE_OTHER): Payer: Medicare Other | Admitting: Physician Assistant

## 2016-01-02 VITALS — BP 118/86 | HR 87 | Temp 98.3°F | Resp 14 | Ht 64.0 in | Wt 210.2 lb

## 2016-01-02 DIAGNOSIS — R3911 Hesitancy of micturition: Secondary | ICD-10-CM

## 2016-01-02 DIAGNOSIS — R399 Unspecified symptoms and signs involving the genitourinary system: Secondary | ICD-10-CM | POA: Diagnosis not present

## 2016-01-02 LAB — POCT URINALYSIS DIPSTICK
Bilirubin, UA: NEGATIVE
Blood, UA: NEGATIVE
Glucose, UA: NEGATIVE
Ketones, UA: NEGATIVE
Leukocytes, UA: NEGATIVE
Nitrite, UA: NEGATIVE
Protein, UA: NEGATIVE
Spec Grav, UA: >=1.03
Urobilinogen, UA: 4
pH, UA: 6

## 2016-01-02 MED ORDER — CEPHALEXIN 500 MG PO CAPS: 500.0000 mg | ORAL_CAPSULE | Freq: Two times a day (BID) | ORAL | Status: DC

## 2016-01-02 NOTE — Progress Notes (Signed)
Patient presents to clinic today c/o left sided lower back pain x 1 week associated with urinary urgency, frequency, incomplete bladder emptying. Also endorses suprapubic pressure. Denies fever, chills, nausea, vomiting, hematuria. Endorses history of urinary issues, previously followed by Urology ([facility]). Endorses having bladder stretched previously. Denies history of kidney stone. Denies trauma, injury or heavy lifting. Denies radiation of back pain.   Past Medical History  Diagnosis Date  . Headache(784.0)     migraines  . Arthritis   . History of migraine     last one about 2wks ago-takes Imitrex prn  . Joint pain   . Joint swelling   . Back pain     arthritis  . GERD (gastroesophageal reflux disease)     takes Omeprazole daily  . History of gastric ulcer   . Depression     takes cymbalta daily  . Ulcer   . Heart murmur   . Meningioma [facility])     Current Outpatient Prescriptions on File Prior to Visit  Medication Sig Dispense Refill  . atorvastatin (LIPITOR) 20 MG tablet Take 1 tablet (20 mg total) by mouth daily. 30 tablet 6  . DULoxetine (CYMBALTA) 60 MG capsule Take 60 mg by mouth 2 (two) times daily.    Marland Kitchen HYDROcodone-acetaminophen (NORCO/VICODIN) 5-325 MG per tablet 1 tablet. PRN  0  . levETIRAcetam (KEPPRA) 1000 MG tablet Take 1 tablet (1,000 mg total) by mouth 2 (two) times daily. 60 tablet 3  . nortriptyline (PAMELOR) 50 MG capsule TAKE 1 CAPSULE(50 MG) BY MOUTH AT BEDTIME 90 capsule 1  . sucralfate (CARAFATE) 1 G tablet Take 1 g by mouth 4 (four) times daily.    Marland Kitchen eletriptan (RELPAX) 40 MG tablet Take 1 tablet (40 mg total) by mouth once. MAY REPEAT IN 2 HOURS IF HEADACHE PERSISTS OR RECURS. DO NOT EXCEED 2 TABLETS IN 24 HOURS (Patient not taking: Reported on 01/02/2016) 9 tablet 4  . ibuprofen (ADVIL,MOTRIN) 200 MG tablet Take 800 mg by mouth every 6 (six) hours as needed (Pain).     No current facility-administered medications on file prior to visit.    No Known  Allergies  Family History  Problem Relation Age of Onset  . Arthritis Mother   . Cancer Mother     breast  . Heart disease Mother   . Depression Mother   . Heart disease Father   . Kidney disease Father     Social History   Social History  . Marital Status: Single    Spouse Name: N/A  . Number of Children: N/A  . Years of Education: N/A   Social History Main Topics  . Smoking status: Never Smoker   . Smokeless tobacco: Never Used  . Alcohol Use: No  . Drug Use: No  . Sexual Activity: No   Other Topics Concern  . None   Social History Narrative   Review of Systems - See HPI.  All other ROS are negative.  BP 118/86 mmHg  Pulse 87  Temp(Src) 98.3 F (36.8 C) (Oral)  Resp 14  Ht 5\' 4"  (1.626 m)  Wt 210 lb 3.2 oz (95.346 kg)  BMI 36.06 kg/m2  SpO2 99%  Physical Exam  Constitutional: She is oriented to person, place, and time.  Cardiovascular: Normal rate, regular rhythm, normal heart sounds and intact distal pulses.   Pulmonary/Chest: Effort normal and breath sounds normal. No respiratory distress. She has no wheezes. She has no rales. She exhibits no tenderness.  Neurological: She is  alert and oriented to person, place, and time.  Skin: Skin is warm and dry. No rash noted.  Psychiatric: Affect normal.  Vitals reviewed.  Assessment/Plan: UTI symptoms Urine dip negative but classic symptoms and + history. Will start Keflex BID while culture is processing. Supportive measures reviewed. If culture negative, suspect IC and will refer to Urology for further assessment.

## 2016-01-02 NOTE — Assessment & Plan Note (Signed)
Urine dip negative but classic symptoms and + history. Will start Keflex BID while culture is processing. Supportive measures reviewed. If culture negative, suspect IC and will refer to Urology for further assessment.

## 2016-01-02 NOTE — Progress Notes (Signed)
Pre visit review using our clinic tool,if applicable. No additional management support is needed unless otherwise documented below in the visit note.  

## 2016-01-02 NOTE — Patient Instructions (Signed)
Your symptoms are concerning for a bladder infection, also called acute cystitis. Please take your antibiotic (Keflex) as directed until all pills are gone.  Stay very well hydrated.  Consider a daily probiotic (Align, Culturelle, or Activia) to help prevent stomach upset caused by the antibiotic.  Taking a probiotic daily may also help prevent recurrent UTIs.  Also consider taking AZO (Phenazopyridine) tablets to help decrease pain with urination.  I will call you with your urine testing results. If negative, then we will want to get you with in Urology for further assessment and to check for interstitial cystitis which is inflammation that mimics symptoms of UTI. Call or return to clinic if symptoms are not resolved by completion of antibiotic.   Urinary Tract Infection A urinary tract infection (UTI) can occur any place along the urinary tract. The tract includes the kidneys, ureters, bladder, and urethra. A type of germ called bacteria often causes a UTI. UTIs are often helped with antibiotic medicine.  HOME CARE   If given, take antibiotics as told by your doctor. Finish them even if you start to feel better.  Drink enough fluids to keep your pee (urine) clear or pale yellow.  Avoid tea, drinks with caffeine, and bubbly (carbonated) drinks.  Pee often. Avoid holding your pee in for a long time.  Pee before and after having sex (intercourse).  Wipe from front to back after you poop (bowel movement) if you are a woman. Use each tissue only once. GET HELP RIGHT AWAY IF:   You have back pain.  You have lower belly (abdominal) pain.  You have chills.  You feel sick to your stomach (nauseous).  You throw up (vomit).  Your burning or discomfort with peeing does not go away.  You have a fever.  Your symptoms are not better in 3 days. MAKE SURE YOU:   Understand these instructions.  Will watch your condition.  Will get help right away if you are not doing well or get  worse. Document Released: 02/19/2008 Document Revised: 05/27/2012 Document Reviewed: 04/02/2012 [facility] Patient Information 2015 [place], [place]. This information is not intended to replace advice given to you by your health care provider. Make sure you discuss any questions you have with your health care provider.

## 2016-01-15 ENCOUNTER — Ambulatory Visit (INDEPENDENT_AMBULATORY_CARE_PROVIDER_SITE_OTHER): Payer: Medicare Other | Admitting: Neurology

## 2016-01-15 ENCOUNTER — Encounter: Payer: Self-pay | Admitting: Neurology

## 2016-01-15 VITALS — BP 126/74 | HR 68 | Ht 64.0 in | Wt 208.0 lb

## 2016-01-15 DIAGNOSIS — R413 Other amnesia: Secondary | ICD-10-CM

## 2016-01-15 DIAGNOSIS — G40209 Localization-related (focal) (partial) symptomatic epilepsy and epileptic syndromes with complex partial seizures, not intractable, without status epilepticus: Secondary | ICD-10-CM | POA: Diagnosis not present

## 2016-01-15 DIAGNOSIS — G43009 Migraine without aura, not intractable, without status migrainosus: Secondary | ICD-10-CM | POA: Diagnosis not present

## 2016-01-15 MED ORDER — NARATRIPTAN HCL 2.5 MG PO TABS: 2.5000 mg | ORAL_TABLET | ORAL | Status: DC | PRN

## 2016-01-15 NOTE — Progress Notes (Signed)
NEUROLOGY FOLLOW UP OFFICE NOTE  [name] [id number]  HISTORY OF PRESENT ILLNESS: Lindsay Frost is a 60 year old right-handed woman with history of depression, migraine, and GERD who follows up for migraine without aura and seizure.    UPDATE: Current abortive therapy: Insurance stopped covering Relpax.  As a result, headaches are lasting 3 days.  They occur 6 times a month. Current preventative therapy:  nortriptyline 50mg  at bedtime Other current medications:  Cymbalta 60mg  twice daily  No seizures.  For past 3 or 4 months, she reports memory problems.  She has been leaving the stove or faucets on and forgetting to shut them off.  One time, she got lost on a familiar route.  She denies any new medications.  Her mother had memory problems when she was older.  Seizure: Last spell occurred 05/27/15.  It was unwitnessed.  This spell was different.  When she woke up, she was short of breath and it took a few minutes to recover.  Sometimes, she will wake up short of breath, but this episode was worse.  She did not have incontinence or tongue biting.  Keppra was increased from 750mg  twice daily to 1000mg  twice daily.  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  HISTORY: Migraine: Onset:  Early 40s Location:  Left frontal Quality:  Stabbing Initial Intensity:  10 out of 10; September 7/10 Aura:  No Prodrome:  Photophobia Associated symptoms:  Nausea, phonophobia, photophobia, sometimes vomiting. No osmophobia, visual disturbance, or autonomic symptoms. Initial Duration:  2-3 days; September 3 hours with Relpax Initial Frequency:  Migraines occur every 2 weeks (4-6 days per month) and dull headaches occur 2 times a week (8 days per month). Total of 14 headache days per month;  September 3 times a month Triggers/exacerbating factors:  Heat Relieving factors:  Laying down in a quiet, dark, cool room with ice pack. Activity:  Cannot function when she has migraine  Past abortive therapy:  sumatriptan 50mg  (ineffective, caused racing heart), Maxalt (ineffective), BC powder Past preventative therapy:  Topamax (side effects, ineffective) Unable to start Inderal LA because insurance wouldn't cover it.  Caffeine:  Coffee, tea, soda Alcohol:  no Smoker:  no Diet:  Junk food Exercise:  Walks dog Depression/stress:  controlled Sleep hygiene:  Good.  Sleeps 10 hours, which she feels is too much (particularly if she has a migraine) Family history of headache:  daughter  08/19/13 MRI BRAIN W/WO:  12 mm left parietal calcified meningioma without surrounding vasogenic edema, minimally increased compared to prior study from 10/2003. MRI of the brain with and without contrast was performed on 06/08/14, which demonstrated stability of the meningioma.  Seizure: Her Wellbutrin was increased in August to help control her depression.    At around that time, she had an episode where she woke up and found herself on the bathroom floor.  She didn't remember falling.  Later in the month, she had another episode where she woke up on the floor next to her bed and noted that she was briefly kicking both legs.  She had a severe rug burn on the left side of her face.  She did not bite her tongue during either event but she did have some urinary incontinence during the episode in the bathroom.  Her  PCP, Dr. Birdie Riddle, subsequently decreased her dose of Wellbutrin.  She has no prior history of seizures.  She had a sleep deprived EEG performed on 06/07/14, which showed brief intermittent transient sharp waves and slowing in the left temporal region, which although not epileptiform, could be a seizure focus.  PAST MEDICAL HISTORY: Past Medical History  Diagnosis Date  . Headache(784.0)     migraines  .  Arthritis   . History of migraine     last one about 2wks ago-takes Imitrex prn  . Joint pain   . Joint swelling   . Back pain     arthritis  . GERD (gastroesophageal reflux disease)     takes Omeprazole daily  . History of gastric ulcer   . Depression     takes cymbalta daily  . Ulcer   . Heart murmur   . Meningioma [facility])     MEDICATIONS: Current Outpatient Prescriptions on File Prior to Visit  Medication Sig Dispense Refill  . atorvastatin (LIPITOR) 20 MG tablet Take 1 tablet (20 mg total) by mouth daily. 30 tablet 6  . dexlansoprazole (DEXILANT) 60 MG capsule Take 60 mg by mouth.    . DULoxetine (CYMBALTA) 60 MG capsule Take 60 mg by mouth 2 (two) times daily.    Marland Kitchen HYDROcodone-acetaminophen (NORCO/VICODIN) 5-325 MG per tablet 1 tablet. PRN  0  . ibuprofen (ADVIL,MOTRIN) 200 MG tablet Take 800 mg by mouth every 6 (six) hours as needed (Pain).    Marland Kitchen levETIRAcetam (KEPPRA) 1000 MG tablet Take 1 tablet (1,000 mg total) by mouth 2 (two) times daily. 60 tablet 3  . nortriptyline (PAMELOR) 50 MG capsule TAKE 1 CAPSULE(50 MG) BY MOUTH AT BEDTIME 90 capsule 1  . sucralfate (CARAFATE) 1 G tablet Take 1 g by mouth 4 (four) times daily.    Marland Kitchen eletriptan (RELPAX) 40 MG tablet Take 1 tablet (40 mg total) by mouth once. MAY REPEAT IN 2 HOURS IF HEADACHE PERSISTS OR RECURS. DO NOT EXCEED 2 TABLETS IN 24 HOURS (Patient not taking: Reported on 01/02/2016) 9 tablet 4   No current facility-administered medications on file prior to visit.    ALLERGIES: No Known Allergies  FAMILY HISTORY: Family History  Problem Relation Age of Onset  . Arthritis Mother   . Cancer Mother     breast  . Heart disease Mother   . Depression Mother   . Heart disease Father   . Kidney disease Father     SOCIAL HISTORY: Social History   Social History  . Marital Status: Single    Spouse Name: N/A  . Number of Children: N/A  . Years of Education: N/A   Occupational History  . Not on file.   Social  History Main Topics  . Smoking status: Never Smoker   . Smokeless tobacco: Never Used  . Alcohol Use: No  . Drug Use: No  . Sexual Activity: No   Other Topics Concern  . Not on file   Social History Narrative    REVIEW OF SYSTEMS: Constitutional: No fevers, chills, or sweats, no generalized fatigue, change in appetite Eyes: No visual changes, double vision, eye pain Ear, nose and throat: No hearing loss, ear pain, nasal congestion, sore throat Cardiovascular: No chest pain, palpitations Respiratory:  No shortness of breath at rest or with exertion, wheezes GastrointestinaI: No nausea, vomiting, diarrhea, abdominal pain, fecal incontinence Genitourinary:  No dysuria, urinary retention or frequency Musculoskeletal:  No neck pain, back pain Integumentary: No rash, pruritus, skin lesions  Neurological: as above Psychiatric: No depression, insomnia, anxiety Endocrine: No palpitations, fatigue, diaphoresis, mood swings, change in appetite, change in weight, increased thirst Hematologic/Lymphatic:  No anemia, purpura, petechiae. Allergic/Immunologic: no itchy/runny eyes, nasal congestion, recent allergic reactions, rashes  PHYSICAL EXAM: Filed Vitals:   01/15/16 1346  BP: 126/74  Pulse: 68   General: No acute distress.   Head:  Normocephalic/atraumatic Eyes:  Fundi examined but not visualized Neck: supple, no paraspinal tenderness, full range of motion Heart:  Regular rate and rhythm Lungs:  Clear to auscultation bilaterally Back: No paraspinal tenderness Neurological Exam: alert and oriented to person, place, and time. Attention span and concentration intact, recent and remote memory intact, fund of knowledge intact.  Speech fluent and not dysarthric, language intact.   MMSE - Mini Mental State Exam 01/15/2016  Orientation to time 5  Orientation to Place 5  Registration 3  Attention/ Calculation 5  Recall 3  Language- name 2 objects 2  Language- repeat 1  Language- follow 3  step command 3  Language- read & follow direction 1  Write a sentence 1  Copy design 0  Total score 29   CN II-XII intact. Bulk and tone normal, muscle strength 5/5 throughout.  sensation to light touch intact.  Deep tendon reflexes 2+ throughout.  Finger to nose and heel to shin testing intact.  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  Consider medication effect?  PLAN: 1.  Continue nortriptyline 50mg  at bedtime 2.  For migraine attacks, will try naratriptan 2.5mg , as recommended by the insurance company.  If ineffective, will petition for Relpax. 3.  Continue Keppra 1000mg  twice daily 4.  Will check TSH and B12.  If labs are normal, we will switch seizure medication from Keppra to Lamictal.  We will also check CMP. 5.  Follow up in 6 months.  15 minutes spent face to face with patient, over 50% spent discussing management and diagnosis.  Metta Clines, DO  CC:  Annye Asa, MD

## 2016-01-15 NOTE — Patient Instructions (Addendum)
1.  Continue nortriptyline 50mg  at bedtime 2.  For migraine attacks, take naratriptan 2.5mg  at earliest onset of headache.  May repeat dose once in 4 hours if needed.  Do not exceed 2 tablets in 24 hours. 3.  Continue Keppra 1000mg  twice daily 4.  Will check TSH and B12.  If labs are normal, we will switch seizure medication from Keppra to Lamictal.  We will also check CMP. 5.  Follow up in 6 months.

## 2016-01-17 ENCOUNTER — Other Ambulatory Visit (INDEPENDENT_AMBULATORY_CARE_PROVIDER_SITE_OTHER): Payer: Medicare Other

## 2016-01-17 DIAGNOSIS — G40209 Localization-related (focal) (partial) symptomatic epilepsy and epileptic syndromes with complex partial seizures, not intractable, without status epilepticus: Secondary | ICD-10-CM | POA: Diagnosis not present

## 2016-01-17 DIAGNOSIS — R413 Other amnesia: Secondary | ICD-10-CM | POA: Diagnosis not present

## 2016-01-17 DIAGNOSIS — K259 Gastric ulcer, unspecified as acute or chronic, without hemorrhage or perforation: Secondary | ICD-10-CM | POA: Diagnosis not present

## 2016-01-17 DIAGNOSIS — K449 Diaphragmatic hernia without obstruction or gangrene: Secondary | ICD-10-CM | POA: Diagnosis not present

## 2016-01-17 DIAGNOSIS — K208 Other esophagitis: Secondary | ICD-10-CM | POA: Diagnosis not present

## 2016-01-17 LAB — COMPLETE METABOLIC PANEL WITH GFR
ALT: 18 U/L (ref 6–29)
AST: 24 U/L (ref 10–35)
Albumin: 4.2 g/dL (ref 3.6–5.1)
Alkaline Phosphatase: 63 U/L (ref 33–130)
BUN: 12 mg/dL (ref 7–25)
CO2: 25 mmol/L (ref 20–31)
Calcium: 9.1 mg/dL (ref 8.6–10.4)
Chloride: 110 mmol/L (ref 98–110)
Creat: 0.71 mg/dL (ref 0.50–1.05)
GFR, Est African American: >89 mL/min (ref >=60)
GFR, Est Non African American: >89 mL/min (ref >=60)
Glucose, Bld: 78 mg/dL (ref 65–99)
Potassium: 3.7 mmol/L (ref 3.5–5.3)
Sodium: 143 mmol/L (ref 135–146)
Total Bilirubin: 0.2 mg/dL (ref 0.2–1.2)
Total Protein: 6.6 g/dL (ref 6.1–8.1)

## 2016-01-17 LAB — VITAMIN B12: Vitamin B-12: 125 pg/mL — ABNORMAL LOW (ref 211–911)

## 2016-01-17 LAB — TSH: TSH: 1.61 u[IU]/mL (ref 0.35–4.50)

## 2016-01-18 ENCOUNTER — Other Ambulatory Visit: Payer: Self-pay

## 2016-01-18 ENCOUNTER — Telehealth: Payer: Self-pay

## 2016-01-18 DIAGNOSIS — E538 Deficiency of other specified B group vitamins: Secondary | ICD-10-CM

## 2016-01-18 MED ORDER — CYANOCOBALAMIN 1000 MCG/ML IJ SOLN: 1000.0000 ug | INTRAMUSCULAR | Status: DC

## 2016-01-18 MED ORDER — CYANOCOBALAMIN 1000 MCG/ML IJ SOLN: 1000.0000 ug | Freq: Every day | INTRAMUSCULAR | Status: DC

## 2016-01-18 NOTE — Telephone Encounter (Signed)
All order's placed. Will call pt after 9 a.m. Mychart message also sent.

## 2016-01-18 NOTE — Telephone Encounter (Signed)
-----   Message from Pieter Partridge, DO sent at 01/18/2016  6:55 AM EDT ----- TSH is normal.  However, B12 level is quite low.  This deficiency may cause memory problems.  I recommend starting B12 injections:  1048mcg daily for 7 days, then weekly for 4 weeks, then monthly for one year.  I would repeat B12 level in 6 months prior to follow up and see if she has improved.

## 2016-01-18 NOTE — Telephone Encounter (Signed)
Pt will come in on Monday for first B12.

## 2016-01-22 ENCOUNTER — Ambulatory Visit (INDEPENDENT_AMBULATORY_CARE_PROVIDER_SITE_OTHER): Payer: Medicare Other | Admitting: Family Medicine

## 2016-01-22 DIAGNOSIS — E538 Deficiency of other specified B group vitamins: Secondary | ICD-10-CM

## 2016-01-22 MED ORDER — CYANOCOBALAMIN 1000 MCG/ML IJ SOLN
1000.0000 ug | Freq: Once | INTRAMUSCULAR | Status: AC
  Administered 2016-01-22: 1000 ug via INTRAMUSCULAR

## 2016-01-22 NOTE — Progress Notes (Signed)
Patient came in today for 1st B12 injection. She tolerated it well.

## 2016-01-23 ENCOUNTER — Ambulatory Visit: Payer: Medicare Other

## 2016-01-25 DIAGNOSIS — Z96652 Presence of left artificial knee joint: Secondary | ICD-10-CM | POA: Diagnosis not present

## 2016-01-25 DIAGNOSIS — M7052 Other bursitis of knee, left knee: Secondary | ICD-10-CM | POA: Diagnosis not present

## 2016-01-25 DIAGNOSIS — M25562 Pain in left knee: Secondary | ICD-10-CM | POA: Diagnosis not present

## 2016-01-29 ENCOUNTER — Ambulatory Visit (INDEPENDENT_AMBULATORY_CARE_PROVIDER_SITE_OTHER): Payer: Medicare Other

## 2016-01-29 DIAGNOSIS — E538 Deficiency of other specified B group vitamins: Secondary | ICD-10-CM

## 2016-01-29 MED ORDER — CYANOCOBALAMIN 1000 MCG/ML IJ SOLN
1000.0000 ug | Freq: Once | INTRAMUSCULAR | Status: AC
  Administered 2016-01-29: 1000 ug via INTRAMUSCULAR

## 2016-01-29 NOTE — Progress Notes (Signed)
Pt presented to clinic for B12 injection. Administered in right deltoid. Tolerated well. Pt did not come for her daily injections. Stated that she hurt her knee last week and was unable to come daily. Would just like to proceed w/ weekly injections.

## 2016-01-30 ENCOUNTER — Ambulatory Visit (INDEPENDENT_AMBULATORY_CARE_PROVIDER_SITE_OTHER): Payer: Medicare Other

## 2016-01-30 DIAGNOSIS — M25562 Pain in left knee: Secondary | ICD-10-CM | POA: Diagnosis not present

## 2016-01-30 DIAGNOSIS — E538 Deficiency of other specified B group vitamins: Secondary | ICD-10-CM | POA: Diagnosis not present

## 2016-01-30 MED ORDER — CYANOCOBALAMIN 1000 MCG/ML IJ SOLN
1000.0000 ug | Freq: Once | INTRAMUSCULAR | Status: AC
  Administered 2016-01-30: 1000 ug via INTRAMUSCULAR

## 2016-01-30 NOTE — Progress Notes (Signed)
Pt presented to clinic for B12 injection. Administered in left detoid. Tolerated well. Will set up next month's injection prior to leaving. Pt has decided that she will be able to come in for her daily shots. Today makes 3/7.

## 2016-01-31 ENCOUNTER — Ambulatory Visit (INDEPENDENT_AMBULATORY_CARE_PROVIDER_SITE_OTHER): Payer: Medicare Other

## 2016-01-31 DIAGNOSIS — E538 Deficiency of other specified B group vitamins: Secondary | ICD-10-CM

## 2016-01-31 MED ORDER — CYANOCOBALAMIN 1000 MCG/ML IJ SOLN
1000.0000 ug | Freq: Once | INTRAMUSCULAR | Status: AC
  Administered 2016-01-31: 1000 ug via INTRAMUSCULAR

## 2016-01-31 NOTE — Progress Notes (Signed)
Pt presented to clinic for B12 injection. Administered in right deltoid. Tolerated well. Will set up next injection prior to leaving. Today makes 4/7

## 2016-02-01 ENCOUNTER — Ambulatory Visit (INDEPENDENT_AMBULATORY_CARE_PROVIDER_SITE_OTHER): Payer: Medicare Other | Admitting: *Deleted

## 2016-02-01 DIAGNOSIS — E538 Deficiency of other specified B group vitamins: Secondary | ICD-10-CM | POA: Diagnosis not present

## 2016-02-01 MED ORDER — CYANOCOBALAMIN 1000 MCG/ML IJ SOLN
1000.0000 ug | Freq: Once | INTRAMUSCULAR | Status: AC
  Administered 2016-02-01: 1000 ug via INTRAMUSCULAR

## 2016-02-01 NOTE — Progress Notes (Signed)
Patient in for B12 injection. 

## 2016-02-02 ENCOUNTER — Ambulatory Visit (INDEPENDENT_AMBULATORY_CARE_PROVIDER_SITE_OTHER): Payer: Medicare Other | Admitting: *Deleted

## 2016-02-02 DIAGNOSIS — E538 Deficiency of other specified B group vitamins: Secondary | ICD-10-CM | POA: Diagnosis not present

## 2016-02-02 MED ORDER — CYANOCOBALAMIN 1000 MCG/ML IJ SOLN
1000.0000 ug | Freq: Once | INTRAMUSCULAR | Status: AC
  Administered 2016-02-02: 1000 ug via INTRAMUSCULAR

## 2016-02-02 NOTE — Progress Notes (Signed)
Patient in for B12 injection. 

## 2016-02-13 ENCOUNTER — Ambulatory Visit (INDEPENDENT_AMBULATORY_CARE_PROVIDER_SITE_OTHER): Payer: Medicare Other | Admitting: *Deleted

## 2016-02-13 DIAGNOSIS — E538 Deficiency of other specified B group vitamins: Secondary | ICD-10-CM | POA: Diagnosis not present

## 2016-02-13 MED ORDER — CYANOCOBALAMIN 1000 MCG/ML IJ SOLN
1000.0000 ug | Freq: Once | INTRAMUSCULAR | Status: AC
  Administered 2016-02-13: 1000 ug via INTRAMUSCULAR

## 2016-02-13 NOTE — Progress Notes (Signed)
Patient in for B12 injection. 

## 2016-02-21 ENCOUNTER — Ambulatory Visit (INDEPENDENT_AMBULATORY_CARE_PROVIDER_SITE_OTHER): Payer: Medicare Other

## 2016-02-21 DIAGNOSIS — E538 Deficiency of other specified B group vitamins: Secondary | ICD-10-CM

## 2016-02-21 MED ORDER — CYANOCOBALAMIN 1000 MCG/ML IJ SOLN
1000.0000 ug | Freq: Once | INTRAMUSCULAR | Status: AC
  Administered 2016-02-21: 1000 ug via INTRAMUSCULAR

## 2016-02-22 ENCOUNTER — Ambulatory Visit: Payer: Medicare Other

## 2016-03-05 ENCOUNTER — Ambulatory Visit (INDEPENDENT_AMBULATORY_CARE_PROVIDER_SITE_OTHER): Payer: Medicare Other

## 2016-03-05 DIAGNOSIS — E538 Deficiency of other specified B group vitamins: Secondary | ICD-10-CM

## 2016-03-05 MED ORDER — CYANOCOBALAMIN 1000 MCG/ML IJ SOLN
1000.0000 ug | Freq: Once | INTRAMUSCULAR | Status: AC
  Administered 2016-03-05: 1000 ug via INTRAMUSCULAR

## 2016-03-25 ENCOUNTER — Encounter: Payer: Self-pay | Admitting: General Practice

## 2016-03-25 ENCOUNTER — Other Ambulatory Visit: Payer: Self-pay | Admitting: General Practice

## 2016-03-25 MED ORDER — DULOXETINE HCL 60 MG PO CPEP: 60.0000 mg | ORAL_CAPSULE | Freq: Two times a day (BID) | ORAL | Status: DC

## 2016-03-25 NOTE — Telephone Encounter (Signed)
Pt needs to schedule f/u- she has not been seen for a routine visit since 2015

## 2016-03-25 NOTE — Telephone Encounter (Signed)
Last OV 08/30/15 (sinus) Duloxetine is listed as a historical med (last filled per chart in 2014)

## 2016-03-25 NOTE — Telephone Encounter (Signed)
mychart message sent to pt to inform.

## 2016-03-26 ENCOUNTER — Ambulatory Visit (INDEPENDENT_AMBULATORY_CARE_PROVIDER_SITE_OTHER): Payer: Medicare Other

## 2016-03-26 DIAGNOSIS — E538 Deficiency of other specified B group vitamins: Secondary | ICD-10-CM

## 2016-03-26 MED ORDER — CYANOCOBALAMIN 1000 MCG/ML IJ SOLN
1000.0000 ug | Freq: Once | INTRAMUSCULAR | Status: AC
  Administered 2016-03-26: 1000 ug via INTRAMUSCULAR

## 2016-04-15 ENCOUNTER — Telehealth: Payer: Self-pay

## 2016-04-15 MED ORDER — ELETRIPTAN HYDROBROMIDE 40 MG PO TABS: 40.0000 mg | ORAL_TABLET | ORAL | 0 refills | Status: DC | PRN

## 2016-04-15 NOTE — Telephone Encounter (Signed)
Now that she has failed naratriptan, they should now approve Relpax 40mg  (may repeat dose once in 2 hours if needed).

## 2016-04-15 NOTE — Telephone Encounter (Signed)
RX sent in. Pt aware. P.A. Initiated via covermymeds.

## 2016-04-15 NOTE — Telephone Encounter (Signed)
Pt called with complaints of migraine. States that the naratriptan is not effective. Would like something else called in now that it has been tried. (Which was a requirement by her insurance company because they did not want to pay for the Relpax before she tried naratriptan.) Pt stated that the Relpax was helpful previously. Please advise.   [phone]

## 2016-04-16 NOTE — Telephone Encounter (Signed)
Received fax from [employer] that P.A. For Relpax approved. Reference #: [id number].

## 2016-04-17 DIAGNOSIS — K208 Other esophagitis: Secondary | ICD-10-CM | POA: Diagnosis not present

## 2016-04-17 DIAGNOSIS — K449 Diaphragmatic hernia without obstruction or gangrene: Secondary | ICD-10-CM | POA: Diagnosis not present

## 2016-04-17 DIAGNOSIS — K259 Gastric ulcer, unspecified as acute or chronic, without hemorrhage or perforation: Secondary | ICD-10-CM | POA: Diagnosis not present

## 2016-04-18 ENCOUNTER — Ambulatory Visit (INDEPENDENT_AMBULATORY_CARE_PROVIDER_SITE_OTHER): Payer: Medicare Other | Admitting: *Deleted

## 2016-04-18 DIAGNOSIS — E538 Deficiency of other specified B group vitamins: Secondary | ICD-10-CM

## 2016-04-18 MED ORDER — CYANOCOBALAMIN 1000 MCG/ML IJ SOLN
1000.0000 ug | Freq: Once | INTRAMUSCULAR | Status: AC
  Administered 2016-04-18: 1000 ug via INTRAMUSCULAR

## 2016-05-09 ENCOUNTER — Ambulatory Visit (INDEPENDENT_AMBULATORY_CARE_PROVIDER_SITE_OTHER): Payer: Medicare Other | Admitting: *Deleted

## 2016-05-09 DIAGNOSIS — E538 Deficiency of other specified B group vitamins: Secondary | ICD-10-CM

## 2016-05-09 MED ORDER — CYANOCOBALAMIN 1000 MCG/ML IJ SOLN
1000.0000 ug | Freq: Once | INTRAMUSCULAR | Status: AC
  Administered 2016-05-09: 1000 ug via INTRAMUSCULAR

## 2016-05-24 ENCOUNTER — Telehealth: Payer: Self-pay | Admitting: Family Medicine

## 2016-05-24 ENCOUNTER — Ambulatory Visit: Payer: Medicare Other | Admitting: Medical

## 2016-05-24 NOTE — Telephone Encounter (Signed)
No charge. 

## 2016-05-24 NOTE — Telephone Encounter (Signed)
Patient lvm at 9:16am cancelling her 1pm appointment due to patient not feeling well. Charge or no charge

## 2016-05-30 ENCOUNTER — Ambulatory Visit (INDEPENDENT_AMBULATORY_CARE_PROVIDER_SITE_OTHER): Payer: Medicare Other

## 2016-05-30 DIAGNOSIS — E538 Deficiency of other specified B group vitamins: Secondary | ICD-10-CM | POA: Diagnosis not present

## 2016-05-30 MED ORDER — CYANOCOBALAMIN 1000 MCG/ML IJ SOLN
1000.0000 ug | Freq: Once | INTRAMUSCULAR | Status: AC
  Administered 2016-05-30: 1000 ug via INTRAMUSCULAR

## 2016-06-02 ENCOUNTER — Other Ambulatory Visit: Payer: Self-pay | Admitting: Neurology

## 2016-06-04 ENCOUNTER — Ambulatory Visit (INDEPENDENT_AMBULATORY_CARE_PROVIDER_SITE_OTHER): Payer: Medicare Other | Admitting: Family

## 2016-06-04 ENCOUNTER — Encounter: Payer: Self-pay | Admitting: Family

## 2016-06-04 VITALS — BP 122/75 | HR 76 | Temp 98.3°F | Resp 16 | Ht 64.0 in | Wt 203.2 lb

## 2016-06-04 DIAGNOSIS — R350 Frequency of micturition: Secondary | ICD-10-CM

## 2016-06-04 LAB — POCT URINALYSIS DIPSTICK
Bilirubin, UA: NEGATIVE
Blood, UA: NEGATIVE
Glucose, UA: NEGATIVE
Ketones, UA: NEGATIVE
Nitrite, UA: NEGATIVE
Spec Grav, UA: >=1.03
Urobilinogen, UA: NEGATIVE
pH, UA: 5.5

## 2016-06-04 MED ORDER — NITROFURANTOIN MONOHYD MACRO 100 MG PO CAPS: 100.0000 mg | ORAL_CAPSULE | Freq: Two times a day (BID) | ORAL | 0 refills | Status: DC

## 2016-06-04 NOTE — Patient Instructions (Signed)
For diarrhea you may use imodium as needed. Please let us know if your diarrhea worsens or if it does not improve. For urinary tract infection- please begin antibiotic (macrodantin).  Let us know if urinary symptoms worsen, if you see blood in your urine, if you have fever >101 or if your symptoms are not improved in 2-3 days.

## 2016-06-04 NOTE — Progress Notes (Signed)
Subjective:    Patient ID: Lindsay Frost, female    DOB: 09/15/1956, 60 y.o.   MRN: [id number]  HPI  Lindsay Frost is a 60 yr old female who presents today with c/o urinary frequency at night.  Reports diarrhea x 3 days. She denies abdominal pain but does have some bladder pressure.  She reports 7 loose stools/day for 3 days. No sick contacts. She denies recent antibiotic use or travel.  She denies fever or blood in stool.     Review of Systems    see HPI  Past Medical History:  Diagnosis Date  . Arthritis   . Back pain    arthritis  . Depression    takes cymbalta daily  . GERD (gastroesophageal reflux disease)    takes Omeprazole daily  . Headache(784.0)    migraines  . Heart murmur   . History of gastric ulcer   . History of migraine    last one about 2wks ago-takes Imitrex prn  . Joint pain   . Joint swelling   . Meningioma ([place])   . Ulcer      Social History   Social History  . Marital status: Single    Spouse name: N/A  . Number of children: N/A  . Years of education: N/A   Occupational History  . Not on file.   Social History Main Topics  . Smoking status: Never Smoker  . Smokeless tobacco: Never Used  . Alcohol use No  . Drug use: No  . Sexual activity: No   Other Topics Concern  . Not on file   Social History Narrative  . No narrative on file    Past Surgical History:  Procedure Laterality Date  . BALLOON DILATION  04/03/2012   Procedure: BALLOON DILATION;  Surgeon: Beryle Beams, MD;  Location: [facility];  Service: Endoscopy;  Laterality: N/A;  . COLONOSCOPY    . DIAGNOSTIC LAPAROSCOPY  29yrs ago  . ESOPHAGEAL MANOMETRY N/A 09/04/2015   Procedure: ESOPHAGEAL MANOMETRY (EM);  Surgeon: Carol Ada, MD;  Location: [facility];  Service: Endoscopy;  Laterality: N/A;  . ESOPHAGOGASTRODUODENOSCOPY  04/03/2012   Procedure: ESOPHAGOGASTRODUODENOSCOPY (EGD);  Surgeon: Beryle Beams, MD;  Location: [facility];  Service: Endoscopy;   Laterality: N/A;  EGD w/ balloon dilation  . ESOPHAGOGASTRODUODENOSCOPY (EGD) WITH PROPOFOL N/A 02/18/2014   Procedure: ESOPHAGOGASTRODUODENOSCOPY (EGD) WITH PROPOFOL;  Surgeon: Beryle Beams, MD;  Location: [facility];  Service: Endoscopy;  Laterality: N/A;  . ESOPHAGOGASTRODUODENOSCOPY (EGD) WITH PROPOFOL N/A 04/22/2014   Procedure: ESOPHAGOGASTRODUODENOSCOPY (EGD) WITH PROPOFOL;  Surgeon: Beryle Beams, MD;  Location: [facility];  Service: Endoscopy;  Laterality: N/A;  . [place] IMPEDANCE STUDY N/A 09/04/2015   Procedure: [place] IMPEDANCE STUDY;  Surgeon: Carol Ada, MD;  Location: [facility];  Service: Endoscopy;  Laterality: N/A;  . REPLACEMENT TOTAL KNEE Right 21yrs ago  . TOTAL KNEE ARTHROPLASTY Left 11/30/2012   Procedure: LEFT TOTAL KNEE ARTHROPLASTY;  Surgeon: Kerin Salen, MD;  Location: [facility];  Service: Orthopedics;  Laterality: Left;    Family History  Problem Relation Age of Onset  . Arthritis Mother   . Cancer Mother     breast  . Heart disease Mother   . Depression Mother   . Heart disease Father   . Kidney disease Father     No Known Allergies  Current Outpatient Prescriptions on File Prior to Visit  Medication Sig Dispense Refill  . atorvastatin (LIPITOR) 20 MG tablet Take 1 tablet (  20 mg total) by mouth daily. 30 tablet 6  . dexlansoprazole (DEXILANT) 60 MG capsule Take 60 mg by mouth.    . DULoxetine (CYMBALTA) 60 MG capsule Take 1 capsule (60 mg total) by mouth 2 (two) times daily. 180 capsule 0  . eletriptan (RELPAX) 40 MG tablet TAKE 1 TABLET BY MOUTH AS NEEDED FOR MIGRAINE OF HEADACHE. MAY REPEAT IN 2 HOURS IF HEADACHE PERSISTS OR RECURS 10 tablet 0  . HYDROcodone-acetaminophen (NORCO/VICODIN) 5-325 MG per tablet 1 tablet. PRN  0  . ibuprofen (ADVIL,MOTRIN) 200 MG tablet Take 800 mg by mouth every 6 (six) hours as needed (Pain).    Marland Kitchen levETIRAcetam (KEPPRA) 1000 MG tablet Take 1 tablet (1,000 mg total) by mouth 2 (two) times daily. 60 tablet 3  . naratriptan  (AMERGE) 2.5 MG tablet Take 1 tablet (2.5 mg total) by mouth as needed for migraine. Take 1 tab at onset of headache; imay repeat once after 4 hours; do not exceed 2 tabs in 24 hours. 10 tablet 5  . nortriptyline (PAMELOR) 50 MG capsule TAKE 1 CAPSULE(50 MG) BY MOUTH AT BEDTIME 90 capsule 1   No current facility-administered medications on file prior to visit.     Pulse 76   Temp 98.3 F (36.8 C) (Oral)   Resp 16   Ht 5\' 4"  (1.626 m)   Wt 203 lb 3.2 oz (92.2 kg)   SpO2 100% Comment: room air  BMI 34.88 kg/m    Objective:   Physical Exam  Constitutional: She appears well-developed and well-nourished.  Cardiovascular: Normal rate, regular rhythm and normal heart sounds.   No murmur heard. Pulmonary/Chest: Effort normal and breath sounds normal. No respiratory distress. She has no wheezes.  Abdominal: Soft. She exhibits no distension. There is tenderness in the suprapubic area. There is no rebound, no guarding and no CVA tenderness.  Psychiatric: She has a normal mood and affect. Her behavior is normal. Judgment and thought content normal.          Assessment & Plan:  UTI-  Trace leuks noted on dip. Will send urine for culture and rx with macrobid.   Viral gastroenteritis- tolerating PO's.  Advised pt to aggressively hydrate, ok to use imodium prn. Should resolve on its own in the next few days. However, if not improving in the next few days she is to let us know so that we can do additional work up including stool studies.

## 2016-06-04 NOTE — Progress Notes (Signed)
Pre visit review using our clinic review tool, if applicable. No additional management support is needed unless otherwise documented below in the visit note. 

## 2016-06-06 LAB — URINE CULTURE

## 2016-06-07 ENCOUNTER — Encounter: Payer: Self-pay | Admitting: Family

## 2016-06-17 ENCOUNTER — Ambulatory Visit (INDEPENDENT_AMBULATORY_CARE_PROVIDER_SITE_OTHER): Payer: Medicare Other | Admitting: Family

## 2016-06-17 ENCOUNTER — Other Ambulatory Visit: Payer: Self-pay | Admitting: Family

## 2016-06-17 ENCOUNTER — Encounter: Payer: Self-pay | Admitting: Family

## 2016-06-17 VITALS — BP 131/73 | HR 76 | Temp 98.3°F | Resp 18 | Ht 64.0 in | Wt 199.0 lb

## 2016-06-17 DIAGNOSIS — Z114 Encounter for screening for human immunodeficiency virus [HIV]: Secondary | ICD-10-CM

## 2016-06-17 DIAGNOSIS — F329 Major depressive disorder, single episode, unspecified: Secondary | ICD-10-CM

## 2016-06-17 DIAGNOSIS — R197 Diarrhea, unspecified: Secondary | ICD-10-CM

## 2016-06-17 DIAGNOSIS — F32A Depression, unspecified: Secondary | ICD-10-CM

## 2016-06-17 DIAGNOSIS — R0981 Nasal congestion: Secondary | ICD-10-CM | POA: Diagnosis not present

## 2016-06-17 DIAGNOSIS — Z23 Encounter for immunization: Secondary | ICD-10-CM | POA: Diagnosis not present

## 2016-06-17 DIAGNOSIS — Z1159 Encounter for screening for other viral diseases: Secondary | ICD-10-CM | POA: Diagnosis not present

## 2016-06-17 DIAGNOSIS — G43909 Migraine, unspecified, not intractable, without status migrainosus: Secondary | ICD-10-CM

## 2016-06-17 DIAGNOSIS — Z1239 Encounter for other screening for malignant neoplasm of breast: Secondary | ICD-10-CM

## 2016-06-17 DIAGNOSIS — Z0000 Encounter for general adult medical examination without abnormal findings: Secondary | ICD-10-CM

## 2016-06-17 LAB — HIV ANTIBODY (ROUTINE TESTING W REFLEX): HIV 1&2 Ab, 4th Generation: NONREACTIVE

## 2016-06-17 LAB — HEPATITIS C ANTIBODY: HCV Ab: NEGATIVE

## 2016-06-17 MED ORDER — DULOXETINE HCL 30 MG PO CPEP: ORAL_CAPSULE | ORAL | 0 refills | Status: DC

## 2016-06-17 MED ORDER — ESCITALOPRAM OXALATE 10 MG PO TABS: ORAL_TABLET | ORAL | 0 refills | Status: DC

## 2016-06-17 NOTE — Progress Notes (Signed)
Pre visit review using our clinic review tool, if applicable. No additional management support is needed unless otherwise documented below in the visit note. 

## 2016-06-17 NOTE — Patient Instructions (Addendum)
Please change cymbalta to 60mg  once daily for 2 weeks, then decrease to 30mg  once daily for 2 weeks then stop. Begin lexapo 10mg  1/2 tab once daily for 1 week, then increase to a full tab once daily on week two.   Begin claritin 10mg  once daily for sinus congestion. Call if new/worsening symptoms, if fever, or if symptoms are not improved in 4 days.

## 2016-06-17 NOTE — Assessment & Plan Note (Signed)
Uncontrolled. Will wean off of cymbalta, begin trial of lexapro. Please change cymbalta to 60mg  once daily for 2 weeks, then decrease to 30mg  once daily for 2 weeks then stop. Begin lexapo 10mg  1/2 tab once daily for 1 week, then increase to a full tab once daily on week two.

## 2016-06-17 NOTE — Progress Notes (Signed)
Subjective:    Patient ID: Lindsay Frost, female    DOB: Sep 06, 1956, 60 y.o.   MRN: [id number]  HPI  Mr. Fullenwider is a 60 yr old female who presents today for follow up.   1) Depression- She is maintained on cymbalta. Mood is "OK."  Has felt more tired lately.  She reports that she has trouble staying asleep.  Had been on wellbutrin in the past but this was stopped due to possible lowering of seizure threshold.  She has never been on lexapro.    2) Migraines- reports that migraines have worsened with recent weather change.  She is followed by Dr. Tomi Likens, neurology for migraines.  He also follows her for hx of seizures.   3) Sinus congestion- has been present x 4 days.  + nasal congestion, cough, phlegm   4) Diarrhea- reports diarrhea "all day over the weekend."  Has been using imodium which helped.   Review of Systems See HPI  Past Medical History:  Diagnosis Date  . Arthritis   . Back pain    arthritis  . Depression    takes cymbalta daily  . GERD (gastroesophageal reflux disease)    takes Omeprazole daily  . Headache(784.0)    migraines  . Heart murmur   . History of gastric ulcer   . History of migraine    last one about 2wks ago-takes Imitrex prn  . Joint pain   . Joint swelling   . Meningioma ([place])   . Ulcer [facility])      Social History   Social History  . Marital status: Single    Spouse name: N/A  . Number of children: N/A  . Years of education: N/A   Occupational History  . Not on file.   Social History Main Topics  . Smoking status: Never Smoker  . Smokeless tobacco: Never Used  . Alcohol use No  . Drug use: No  . Sexual activity: No   Other Topics Concern  . Not on file   Social History Narrative  . No narrative on file    Past Surgical History:  Procedure Laterality Date  . BALLOON DILATION  04/03/2012   Procedure: BALLOON DILATION;  Surgeon: Beryle Beams, MD;  Location: [facility];  Service: Endoscopy;  Laterality: N/A;  . COLONOSCOPY     . DIAGNOSTIC LAPAROSCOPY  72yrs ago  . ESOPHAGEAL MANOMETRY N/A 09/04/2015   Procedure: ESOPHAGEAL MANOMETRY (EM);  Surgeon: Carol Ada, MD;  [facility]: [facility];  Service: Endoscopy;  Laterality: N/A;  . ESOPHAGOGASTRODUODENOSCOPY  04/03/2012   Procedure: ESOPHAGOGASTRODUODENOSCOPY (EGD);  Surgeon: Beryle Beams, MD;  Location: [facility];  Service: Endoscopy;  Laterality: N/A;  EGD w/ balloon dilation  . ESOPHAGOGASTRODUODENOSCOPY (EGD) WITH PROPOFOL N/A 02/18/2014   Procedure: ESOPHAGOGASTRODUODENOSCOPY (EGD) WITH PROPOFOL;  Surgeon: Beryle Beams, MD;  [facility];  Service: Endoscopy;  Laterality: N/A;  . ESOPHAGOGASTRODUODENOSCOPY (EGD) WITH PROPOFOL N/A 04/22/2014   Procedure: ESOPHAGOGASTRODUODENOSCOPY (EGD) WITH PROPOFOL;  Surgeon: Beryle Beams, MD;  [facility];  Service: Endoscopy;  Laterality: N/A;  . [place] IMPEDANCE STUDY N/A 09/04/2015   Procedure: [place] IMPEDANCE STUDY;  Surgeon: Carol Ada, MD;  [facility];  Service: Endoscopy;  Laterality: N/A;  . REPLACEMENT TOTAL KNEE Right 69yrs ago  . TOTAL KNEE ARTHROPLASTY Left 11/30/2012   Procedure: LEFT TOTAL KNEE ARTHROPLASTY;  Surgeon: Kerin Salen, MD;  Location: [facility];  Service: Orthopedics;  Laterality: Left;    Family History  Problem  Relation Age of Onset  . Arthritis Mother   . Cancer Mother     breast  . Heart disease Mother   . Depression Mother   . Heart disease Father   . Kidney disease Father     No Known Allergies  Current Outpatient Prescriptions on File Prior to Visit  Medication Sig Dispense Refill  . atorvastatin (LIPITOR) 20 MG tablet Take 1 tablet (20 mg total) by mouth daily. 30 tablet 6  . dexlansoprazole (DEXILANT) 60 MG capsule Take 60 mg by mouth.    . DULoxetine (CYMBALTA) 60 MG capsule Take 1 capsule (60 mg total) by mouth 2 (two) times daily. 180 capsule 0  . eletriptan (RELPAX) 40 MG tablet TAKE 1 TABLET BY MOUTH AS NEEDED FOR MIGRAINE OF HEADACHE. MAY  REPEAT IN 2 HOURS IF HEADACHE PERSISTS OR RECURS 10 tablet 0  . HYDROcodone-acetaminophen (NORCO/VICODIN) 5-325 MG per tablet 1 tablet. PRN  0  . ibuprofen (ADVIL,MOTRIN) 200 MG tablet Take 800 mg by mouth every 6 (six) hours as needed (Pain).    Marland Kitchen levETIRAcetam (KEPPRA) 1000 MG tablet Take 1 tablet (1,000 mg total) by mouth 2 (two) times daily. 60 tablet 3  . nortriptyline (PAMELOR) 50 MG capsule TAKE 1 CAPSULE(50 MG) BY MOUTH AT BEDTIME 90 capsule 1   No current facility-administered medications on file prior to visit.     BP 131/73 (BP Location: Right Arm, Cuff Size: Large)   Pulse 76   Temp 98.3 F (36.8 C) (Oral)   Resp 18   Ht 5\' 4"  (1.626 m)   Wt 199 lb (90.3 kg)   SpO2 99% Comment: room air  BMI 34.16 kg/m       Objective:   Physical Exam  Constitutional: She appears well-developed and well-nourished.  HENT:  Right Ear: Tympanic membrane and ear canal normal.  Left Ear: Tympanic membrane and ear canal normal.  Mouth/Throat: No oropharyngeal exudate, posterior oropharyngeal edema or posterior oropharyngeal erythema.  Cardiovascular: Normal rate, regular rhythm and normal heart sounds.   No murmur heard. Pulmonary/Chest: Effort normal and breath sounds normal. No respiratory distress. She has no wheezes.  Psychiatric: She has a normal mood and affect. Her behavior is normal. Judgment and thought content normal.          Assessment & Plan:  Migraines- uncontrolled, defer to neurology.  Sinus congestion- suspect allergies. Advised pt as follows:   Begin claritin 10mg  once daily for sinus congestion. Begin trial of claritin. Call if new/worsening symptoms, if fever, or if symptoms are not improved in 4 days.   Diarrhea- resolved.  Advised pt to let me know if recurrent diarrhea.  Flu shot today.

## 2016-06-18 NOTE — Telephone Encounter (Signed)
Received requests from [facility] requesting 90 day supply of lexapro and duloxetine. Denials sent to pharmacy as 90 day supply not appropriate at this time. Pt is weaning off duloxetine and lexapro is new start. Will be re-evaluated in 1 month.

## 2016-06-25 ENCOUNTER — Ambulatory Visit (INDEPENDENT_AMBULATORY_CARE_PROVIDER_SITE_OTHER): Payer: Medicare Other

## 2016-06-25 DIAGNOSIS — E538 Deficiency of other specified B group vitamins: Secondary | ICD-10-CM

## 2016-06-25 MED ORDER — CYANOCOBALAMIN 1000 MCG/ML IJ SOLN
1000.0000 ug | Freq: Once | INTRAMUSCULAR | Status: AC
  Administered 2016-06-25: 1000 ug via INTRAMUSCULAR

## 2016-06-27 DIAGNOSIS — M25562 Pain in left knee: Secondary | ICD-10-CM | POA: Diagnosis not present

## 2016-06-27 DIAGNOSIS — S93411A Sprain of calcaneofibular ligament of right ankle, initial encounter: Secondary | ICD-10-CM | POA: Diagnosis not present

## 2016-07-03 ENCOUNTER — Other Ambulatory Visit: Payer: Self-pay | Admitting: Neurology

## 2016-07-16 ENCOUNTER — Ambulatory Visit (INDEPENDENT_AMBULATORY_CARE_PROVIDER_SITE_OTHER): Payer: Medicare Other | Admitting: Family

## 2016-07-16 ENCOUNTER — Other Ambulatory Visit: Payer: Self-pay | Admitting: Family

## 2016-07-16 ENCOUNTER — Encounter: Payer: Self-pay | Admitting: Family

## 2016-07-16 VITALS — BP 151/88 | HR 71 | Temp 98.2°F | Resp 16 | Ht 64.0 in | Wt 201.6 lb

## 2016-07-16 DIAGNOSIS — Z1239 Encounter for other screening for malignant neoplasm of breast: Secondary | ICD-10-CM

## 2016-07-16 DIAGNOSIS — R030 Elevated blood-pressure reading, without diagnosis of hypertension: Secondary | ICD-10-CM

## 2016-07-16 DIAGNOSIS — J309 Allergic rhinitis, unspecified: Secondary | ICD-10-CM | POA: Diagnosis not present

## 2016-07-16 DIAGNOSIS — F329 Major depressive disorder, single episode, unspecified: Secondary | ICD-10-CM

## 2016-07-16 DIAGNOSIS — Z0000 Encounter for general adult medical examination without abnormal findings: Secondary | ICD-10-CM | POA: Diagnosis not present

## 2016-07-16 DIAGNOSIS — F32A Depression, unspecified: Secondary | ICD-10-CM

## 2016-07-16 HISTORY — DX: Allergic rhinitis, unspecified: J30.9

## 2016-07-16 MED ORDER — ESCITALOPRAM OXALATE 20 MG PO TABS: 20.0000 mg | ORAL_TABLET | Freq: Every day | ORAL | 1 refills | Status: DC

## 2016-07-16 NOTE — Patient Instructions (Signed)
Please increase lexapro form 10mg  to 20mg .

## 2016-07-16 NOTE — Progress Notes (Signed)
Subjective:    Patient ID: Lindsay Frost, female    DOB: 20-Nov-1955, 60 y.o.   MRN: [id number]  HPI  [name] is a 60 yr old female who presents today for follow up of her depression. She reports that "I feel very depressed."  She thinks that she felt better on the cymbalta from a mood standpoint and a pain standpoint.   Reports stress re: her health problems.  Has her appointment with neurology to re-assess her meningioma. Lindsay Frost needs new brakes.  She has not seen psychiatry in in "years."  She is unable to afford to go.   Diarrhea- notes that she continues to have some loose stools. She reports that it occurs a "couple times a week."  Declines referral to GI.   Allergic rhinitis- improved with claritin.  Review of Systems    see HPI  Past Medical History:  Diagnosis Date  . Arthritis   . Back pain    arthritis  . Depression    takes cymbalta daily  . GERD (gastroesophageal reflux disease)    takes Omeprazole daily  . Headache(784.0)    migraines  . Heart murmur   . History of gastric ulcer   . History of migraine    last one about 2wks ago-takes Imitrex prn  . Joint pain   . Joint swelling   . Meningioma ([place])   . Ulcer [facility])      Social History   Social History  . Marital status: Single    Spouse name: N/A  . Number of children: N/A  . Years of education: N/A   Occupational History  . Not on file.   Social History Main Topics  . Smoking status: Never Smoker  . Smokeless tobacco: Never Used  . Alcohol use No  . Drug use: No  . Sexual activity: No   Other Topics Concern  . Not on file   Social History Narrative  . No narrative on file    Past Surgical History:  Procedure Laterality Date  . BALLOON DILATION  04/03/2012   Procedure: BALLOON DILATION;  Surgeon: Beryle Beams, MD;  [facility];  Service: Endoscopy;  Laterality: N/A;  . COLONOSCOPY    . DIAGNOSTIC LAPAROSCOPY  3yrs ago  . ESOPHAGEAL MANOMETRY N/A 09/04/2015   Procedure:  ESOPHAGEAL MANOMETRY (EM);  Surgeon: Carol Ada, MD;  [facility];  Service: Endoscopy;  Laterality: N/A;  . ESOPHAGOGASTRODUODENOSCOPY  04/03/2012   Procedure: ESOPHAGOGASTRODUODENOSCOPY (EGD);  Surgeon: Beryle Beams, MD;  Location: [facility];  Service: Endoscopy;  Laterality: N/A;  EGD w/ balloon dilation  . ESOPHAGOGASTRODUODENOSCOPY (EGD) WITH PROPOFOL N/A 02/18/2014   Procedure: ESOPHAGOGASTRODUODENOSCOPY (EGD) WITH PROPOFOL;  Surgeon: Beryle Beams, MD;  [facility];  Service: Endoscopy;  Laterality: N/A;  . ESOPHAGOGASTRODUODENOSCOPY (EGD) WITH PROPOFOL N/A 04/22/2014   Procedure: ESOPHAGOGASTRODUODENOSCOPY (EGD) WITH PROPOFOL;  Surgeon: Beryle Beams, MD;  [facility];  Service: Endoscopy;  Laterality: N/A;  . [place] IMPEDANCE STUDY N/A 09/04/2015   Procedure: [place] IMPEDANCE STUDY;  Surgeon: Carol Ada, MD;  [facility];  Service: Endoscopy;  Laterality: N/A;  . REPLACEMENT TOTAL KNEE Right 54yrs ago  . TOTAL KNEE ARTHROPLASTY Left 11/30/2012   Procedure: LEFT TOTAL KNEE ARTHROPLASTY;  Surgeon: Kerin Salen, MD;  Location: [facility];  Service: Orthopedics;  Laterality: Left;    Family History  Problem Relation Age of Onset  . Arthritis Mother   . Cancer Mother     breast  .  Heart disease Mother   . Depression Mother   . Heart disease Father   . Kidney disease Father     Allergies  Allergen Reactions  . Naratriptan     Ineffective    Current Outpatient Prescriptions on File Prior to Visit  Medication Sig Dispense Refill  . atorvastatin (LIPITOR) 20 MG tablet Take 1 tablet (20 mg total) by mouth daily. 30 tablet 6  . dexlansoprazole (DEXILANT) 60 MG capsule Take 60 mg by mouth.    . eletriptan (RELPAX) 40 MG tablet TAKE 1 TABLET BY MOUTH AS NEEDED FOR MIGRAINE OF HEADACHE. MAY REPEAT IN 2 HOURS IF HEADACHE PERSISTS OR RECURS 10 tablet 0  . escitalopram (LEXAPRO) 10 MG tablet 1/2 tab by mouth once daily for 1 week, then increase to a  full tab once daily on week two 30 tablet 0  . HYDROcodone-acetaminophen (NORCO/VICODIN) 5-325 MG per tablet 1 tablet. PRN  0  . ibuprofen (ADVIL,MOTRIN) 200 MG tablet Take 800 mg by mouth every 6 (six) hours as needed (Pain).    Marland Kitchen levETIRAcetam (KEPPRA) 1000 MG tablet Take 1 tablet (1,000 mg total) by mouth 2 (two) times daily. 60 tablet 3  . nortriptyline (PAMELOR) 50 MG capsule TAKE 1 CAPSULE(50 MG) BY MOUTH AT BEDTIME 90 capsule 1   No current facility-administered medications on file prior to visit.     BP (!) 151/88 (BP Location: Right Arm, Patient Position: Sitting, Cuff Size: Large)   Pulse 71   Temp 98.2 F (36.8 C) (Oral)   Resp 16   Ht 5\' 4"  (1.626 m)   Wt 201 lb 9.6 oz (91.4 kg)   SpO2 100% Comment: RA  BMI 34.60 kg/m    Objective:   Physical Exam  Constitutional: She is oriented to person, place, and time. She appears well-developed and well-nourished.  HENT:  Head: Normocephalic and atraumatic.  Cardiovascular: Normal rate, regular rhythm and normal heart sounds.   No murmur heard. Pulmonary/Chest: Effort normal and breath sounds normal. No respiratory distress. She has no wheezes.  Neurological: She is alert and oriented to person, place, and time.  Psychiatric: She has a normal mood and affect. Her behavior is normal. Judgment and thought content normal.          Assessment & Plan:  Elevated BP - plan to repeat at her follow up visit. And if still elevated, consider adding bp med.

## 2016-07-16 NOTE — Assessment & Plan Note (Addendum)
Improved on claritin, continue same.

## 2016-07-16 NOTE — Progress Notes (Signed)
Pre visit review using our clinic review tool, if applicable. No additional management support is needed unless otherwise documented below in the visit note. 

## 2016-07-16 NOTE — Assessment & Plan Note (Signed)
Uncontrolled. Increase lexapro from 10 mg to 20 mg. We discussed risk of serotonin syndrome.  She uses relpax sparingly.  I think benefit outweighs risk for her.

## 2016-07-17 ENCOUNTER — Other Ambulatory Visit (INDEPENDENT_AMBULATORY_CARE_PROVIDER_SITE_OTHER): Payer: Medicare Other

## 2016-07-17 ENCOUNTER — Ambulatory Visit: Payer: Medicare Other

## 2016-07-17 ENCOUNTER — Other Ambulatory Visit: Payer: Self-pay | Admitting: Neurology

## 2016-07-17 ENCOUNTER — Encounter: Payer: Self-pay | Admitting: Neurology

## 2016-07-17 ENCOUNTER — Ambulatory Visit (INDEPENDENT_AMBULATORY_CARE_PROVIDER_SITE_OTHER): Payer: Medicare Other | Admitting: Neurology

## 2016-07-17 VITALS — BP 130/86 | HR 83 | Ht 64.0 in | Wt 200.0 lb

## 2016-07-17 DIAGNOSIS — G40209 Localization-related (focal) (partial) symptomatic epilepsy and epileptic syndromes with complex partial seizures, not intractable, without status epilepticus: Secondary | ICD-10-CM | POA: Diagnosis not present

## 2016-07-17 DIAGNOSIS — E538 Deficiency of other specified B group vitamins: Secondary | ICD-10-CM

## 2016-07-17 DIAGNOSIS — R413 Other amnesia: Secondary | ICD-10-CM | POA: Diagnosis not present

## 2016-07-17 DIAGNOSIS — G43009 Migraine without aura, not intractable, without status migrainosus: Secondary | ICD-10-CM | POA: Diagnosis not present

## 2016-07-17 LAB — VITAMIN B12: Vitamin B-12: >1500 pg/mL — ABNORMAL HIGH (ref 211–911)

## 2016-07-17 MED ORDER — CYANOCOBALAMIN 1000 MCG/ML IJ SOLN
1000.0000 ug | Freq: Once | INTRAMUSCULAR | Status: AC
  Administered 2016-07-17: 1000 ug via INTRAMUSCULAR

## 2016-07-17 MED ORDER — TOPIRAMATE 50 MG PO TABS: ORAL_TABLET | ORAL | 0 refills | Status: DC

## 2016-07-17 NOTE — Telephone Encounter (Signed)
Rx for Lexapro 20 mg was sent into pharmacy on 07/16/16 with #30 tablet and 1 refill to [facility]. TL/CMA
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
## 2016-07-17 NOTE — Patient Instructions (Addendum)
1.  We will start topiramate (Topamax) 50mg  tablets.  We will increase the dose as follows to goal of 100mg  twice daily:      Morning Evening Week 1:   0.5 tab  0.5 tab Week 2:    1 tab  1 tab Week 3:   1.5 tabs 1.5 tabs Week 4 and thereafter 2 tabs  2 tabs.  Possible side effects include: impaired thinking, sedation, paresthesias (numbness and tingling) and weight loss.  It may cause dehydration and there is a small risk for kidney stones, so make sure to stay hydrated with water during the day.  There is also a very small risk for glaucoma, so if you notice any change in your vision while taking this medication, see an ophthalmologist.  There is also a very small risk of possible suicidal ideation, as it the case with all antiepileptic medications.  2.  In the meantime, continue Keppra 1000mg  twice daily.  Contact me in 4 weeks (once you start 2 tablets twice daily) and we can then slowly get off of Keppra. 3.  Continue nortriptyline 50mg  at bedtime.  If headaches improve on topamax, we can discontinue this as well. 4.  Continue B12.  Recheck level. 5.  Follow up in 6 months.

## 2016-07-18 ENCOUNTER — Telehealth: Payer: Self-pay

## 2016-07-18 ENCOUNTER — Other Ambulatory Visit: Payer: Self-pay | Admitting: Family

## 2016-07-18 NOTE — Telephone Encounter (Signed)
Sent via mychart

## 2016-07-18 NOTE — Telephone Encounter (Signed)
-----   Message from Pieter Partridge, DO sent at 07/18/2016  7:14 AM EDT ----- B12 is okay

## 2016-07-20 ENCOUNTER — Other Ambulatory Visit: Payer: Self-pay | Admitting: Neurology

## 2016-07-31 ENCOUNTER — Other Ambulatory Visit: Payer: Self-pay | Admitting: Neurology

## 2016-08-05 ENCOUNTER — Ambulatory Visit (INDEPENDENT_AMBULATORY_CARE_PROVIDER_SITE_OTHER): Payer: Medicare Other

## 2016-08-05 DIAGNOSIS — E538 Deficiency of other specified B group vitamins: Secondary | ICD-10-CM

## 2016-08-05 MED ORDER — CYANOCOBALAMIN 1000 MCG/ML IJ SOLN
1000.0000 ug | Freq: Once | INTRAMUSCULAR | Status: AC
  Administered 2016-08-05: 1000 ug via INTRAMUSCULAR

## 2016-08-14 ENCOUNTER — Encounter: Payer: Self-pay | Admitting: Medical

## 2016-08-14 ENCOUNTER — Ambulatory Visit (INDEPENDENT_AMBULATORY_CARE_PROVIDER_SITE_OTHER): Payer: Medicare Other | Admitting: Medical

## 2016-08-14 ENCOUNTER — Other Ambulatory Visit: Payer: Self-pay | Admitting: Medical

## 2016-08-14 ENCOUNTER — Ambulatory Visit (HOSPITAL_BASED_OUTPATIENT_CLINIC_OR_DEPARTMENT_OTHER)
Admission: RE | Admit: 2016-08-14 | Discharge: 2016-08-14 | Disposition: A | Discharge status: Home / Self Care | Payer: Medicare Other | Source: Ambulatory Visit | Attending: Medical | Admitting: Medical

## 2016-08-14 ENCOUNTER — Other Ambulatory Visit: Payer: Self-pay | Admitting: Neurology

## 2016-08-14 VITALS — BP 128/80 | HR 87 | Temp 98.1°F | Ht 64.0 in | Wt 193.4 lb

## 2016-08-14 DIAGNOSIS — J0100 Acute maxillary sinusitis, unspecified: Secondary | ICD-10-CM

## 2016-08-14 DIAGNOSIS — R0600 Dyspnea, unspecified: Secondary | ICD-10-CM | POA: Insufficient documentation

## 2016-08-14 DIAGNOSIS — R059 Cough, unspecified: Secondary | ICD-10-CM

## 2016-08-14 DIAGNOSIS — R918 Other nonspecific abnormal finding of lung field: Secondary | ICD-10-CM | POA: Insufficient documentation

## 2016-08-14 DIAGNOSIS — J329 Chronic sinusitis, unspecified: Secondary | ICD-10-CM | POA: Diagnosis not present

## 2016-08-14 DIAGNOSIS — R05 Cough: Secondary | ICD-10-CM | POA: Insufficient documentation

## 2016-08-14 DIAGNOSIS — R0602 Shortness of breath: Secondary | ICD-10-CM | POA: Diagnosis not present

## 2016-08-14 MED ORDER — ALBUTEROL SULFATE HFA 108 (90 BASE) MCG/ACT IN AERS: 2.0000 | INHALATION_SPRAY | Freq: Four times a day (QID) | RESPIRATORY_TRACT | 0 refills | Status: DC | PRN

## 2016-08-14 MED ORDER — FLUTICASONE PROPIONATE 50 MCG/ACT NA SUSP: 2.0000 | Freq: Every day | NASAL | 1 refills | Status: DC

## 2016-08-14 MED ORDER — DOXYCYCLINE HYCLATE 100 MG PO TABS: 100.0000 mg | ORAL_TABLET | Freq: Two times a day (BID) | ORAL | 0 refills | Status: DC

## 2016-08-14 MED ORDER — BENZONATATE 100 MG PO CAPS: 100.0000 mg | ORAL_CAPSULE | Freq: Three times a day (TID) | ORAL | 0 refills | Status: DC | PRN

## 2016-08-14 NOTE — Progress Notes (Signed)
Subjective:    Patient ID: Lindsay Frost, female    DOB: 08/25/1956, 60 y.o.   MRN: [id number]  HPI Pt in sick for one week.  She states hx of sinus infections. Recent illness cough and nasal congestion.Nose has been stopped and running some.  Has been coughing up dark brownish.  No wheezing. But states at times when laying down feels like can't take full deep breath.   Pt never smoked. No prior use of inhalers.     Review of Systems  Constitutional: Negative for chills, fatigue and fever.  HENT: Positive for congestion, sinus pain and sinus pressure. Negative for sneezing and sore throat.   Respiratory: Positive for cough. Negative for shortness of breath and wheezing.   Cardiovascular: Negative for chest pain and palpitations.  Gastrointestinal: Negative for abdominal pain.  Musculoskeletal: Negative for back pain.  Skin: Negative for rash.  Neurological: Negative for dizziness and headaches.  Hematological: Negative for adenopathy. Does not bruise/bleed easily.  Psychiatric/Behavioral: Negative for behavioral problems and confusion.    Past Medical History:  Diagnosis Date  . Arthritis   . Back pain    arthritis  . Depression    takes cymbalta daily  . GERD (gastroesophageal reflux disease)    takes Omeprazole daily  . Headache(784.0)    migraines  . Heart murmur   . History of gastric ulcer   . History of migraine    last one about 2wks ago-takes Imitrex prn  . Joint pain   . Joint swelling   . Meningioma ([place])   . Ulcer [facility])      Social History   Social History  . Marital status: Single    Spouse name: N/A  . Number of children: N/A  . Years of education: N/A   Occupational History  . Not on file.   Social History Main Topics  . Smoking status: Never Smoker  . Smokeless tobacco: Never Used  . Alcohol use No  . Drug use: No  . Sexual activity: No   Other Topics Concern  . Not on file   Social History Narrative  . No narrative on file      Past Surgical History:  Procedure Laterality Date  . BALLOON DILATION  04/03/2012   Procedure: BALLOON DILATION;  Surgeon: Beryle Beams, MD;  Location: [facility];  Service: Endoscopy;  Laterality: N/A;  . COLONOSCOPY    . DIAGNOSTIC LAPAROSCOPY  8yrs ago  . ESOPHAGEAL MANOMETRY N/A 09/04/2015   Procedure: ESOPHAGEAL MANOMETRY (EM);  Surgeon: Carol Ada, MD;  [facility];  Service: Endoscopy;  Laterality: N/A;  . ESOPHAGOGASTRODUODENOSCOPY  04/03/2012   Procedure: ESOPHAGOGASTRODUODENOSCOPY (EGD);  Surgeon: Beryle Beams, MD;  [facility];  Service: Endoscopy;  Laterality: N/A;  EGD w/ balloon dilation  . ESOPHAGOGASTRODUODENOSCOPY (EGD) WITH PROPOFOL N/A 02/18/2014   Procedure: ESOPHAGOGASTRODUODENOSCOPY (EGD) WITH PROPOFOL;  Surgeon: Beryle Beams, MD;  [facility];  Service: Endoscopy;  Laterality: N/A;  . ESOPHAGOGASTRODUODENOSCOPY (EGD) WITH PROPOFOL N/A 04/22/2014   Procedure: ESOPHAGOGASTRODUODENOSCOPY (EGD) WITH PROPOFOL;  Surgeon: Beryle Beams, MD;  [facility];  Service: Endoscopy;  Laterality: N/A;  . [place] IMPEDANCE STUDY N/A 09/04/2015   Procedure: [place] IMPEDANCE STUDY;  Surgeon: Carol Ada, MD;  [facility];  Service: Endoscopy;  Laterality: N/A;  . REPLACEMENT TOTAL KNEE Right 70yrs ago  . TOTAL KNEE ARTHROPLASTY Left 11/30/2012   Procedure: LEFT TOTAL KNEE ARTHROPLASTY;  Surgeon: Kerin Salen, MD;  Location: [facility];  Service: Orthopedics;  Laterality: Left;    Family History  Problem Relation Age of Onset  . Arthritis Mother   . Cancer Mother     breast  . Heart disease Mother   . Depression Mother   . Heart disease Father   . Kidney disease Father     Allergies  Allergen Reactions  . Naratriptan     Ineffective    Current Outpatient Prescriptions on File Prior to Visit  Medication Sig Dispense Refill  . atorvastatin (LIPITOR) 20 MG tablet Take 1 tablet (20 mg total) by mouth daily. 30 tablet 6  .  dexlansoprazole (DEXILANT) 60 MG capsule Take 60 mg by mouth.    . eletriptan (RELPAX) 40 MG tablet TAKE 1 TABLET BY MOUTH AS NEEDED FOR MIGRAINE OF HEADACHE. MAY REPEAT IN 2 HOURS IF HEADACHE PERSISTS OR RECURS 10 tablet 0  . eletriptan (RELPAX) 40 MG tablet TAKE 1 TABLET BY MOUTH AS NEEDED FOR MIGRAINE OF HEADACHE. MAY REPEAT IN 2 HOURS IF HEADACHE PERSISTS OR RECURS 10 tablet 0  . escitalopram (LEXAPRO) 20 MG tablet Take 1 tablet (20 mg total) by mouth daily. 30 tablet 1  . HYDROcodone-acetaminophen (NORCO/VICODIN) 5-325 MG per tablet 1 tablet. PRN  0  . ibuprofen (ADVIL,MOTRIN) 200 MG tablet Take 800 mg by mouth every 6 (six) hours as needed (Pain).    Marland Kitchen levETIRAcetam (KEPPRA) 1000 MG tablet Take 1 tablet (1,000 mg total) by mouth 2 (two) times daily. 60 tablet 3  . naratriptan (AMERGE) 2.5 MG tablet TAKE 1 TABLET BY MOUTH AT ONSET OF MIGRAINE HEADACHE. MAY REPEAT ONCE AFTER 4 HORUS. DO NOT EXCEED 2 TABLETS IN 24 HOURS 10 tablet 0  . nortriptyline (PAMELOR) 50 MG capsule TAKE 1 CAPSULE(50 MG) BY MOUTH AT BEDTIME 90 capsule 1  . topiramate (TOPAMAX) 50 MG tablet AKE 1/2 TABLET TWICE DAILY X 7 DAYS, THEN 1 TABLET TWICE DAILY X 7 DAYS THEN 1 AND 1/2 TABLET TWICE DAILY X7 DAYS THEN 2 TABLETS TWICE DAILY 360 tablet 1   No current facility-administered medications on file prior to visit.     BP 128/80 (BP Location: Left Arm, Patient Position: Sitting, Cuff Size: Normal)   Pulse 87   Temp 98.1 F (36.7 C) (Oral)   Ht 5\' 4"  (1.626 m)   Wt 193 lb 6.4 oz (87.7 kg)   SpO2 98%   BMI 33.20 kg/m       Objective:   Physical Exam  General  Mental Status - Alert. General Appearance - Well groomed. Not in acute distress.  Skin Rashes- No Rashes.  HEENT Head- Normal. Ear Auditory Canal - Left- Normal. Right - Normal.Tympanic Membrane- Left- Normal. Right- Normal. Eye Sclera/Conjunctiva- Left- Normal. Right- Normal. Nose & Sinuses Nasal Mucosa- Left-  Boggy and Congested. Right-  Boggy and   Congested.Bilateral maxillary and frontal sinus pressure. Mouth & Throat Lips: Upper Lip- Normal: no dryness, cracking, pallor, cyanosis, or vesicular eruption. Lower Lip-Normal: no dryness, cracking, pallor, cyanosis or vesicular eruption. Buccal Mucosa- Bilateral- No Aphthous ulcers. Oropharynx- No Discharge or Erythema. Tonsils: Characteristics- Bilateral- No Erythema or Congestion. Size/Enlargement- Bilateral- No enlargement. Discharge- bilateral-None.  Neck Neck- Supple. No Masses.   Chest and Lung Exam Auscultation: Breath Sounds:-Clear even and unlabored.  Cardiovascular Auscultation:Rythm- Regular, rate and rhythm. Murmurs & Other Heart Sounds:Ausculatation of the heart reveal- No Murmurs.  Lymphatic Head & Neck General Head & Neck Lymphatics: Bilateral: Description- No Localized lymphadenopathy.  Lower ext- no pedal edema. Negative homans sign.  Assessment & Plan:  You appear to have a sinus infection. I am prescribing doxycycline  antibiotic for the infection. To help with the nasal congestion I prescribed nasal steroid flonase. For your associated cough, I prescribed cough medicine benzonatate  Rest, hydrate, tylenol for fever.  Recommend cxr to evaluate your mild sob at night and having to sleep more upright position.(notify us of any worse breathing issues or swelling of your legs or pain behind your knees)  If you have any wheezing then use albuterol  At your request for chronic sinus infections refer to ENT.   Follow up in 7 days or as needed.  [place] Defina, Percell Miller, PA-C

## 2016-08-14 NOTE — Progress Notes (Signed)
Pre visit review using our clinic review tool, if applicable. No additional management support is needed unless otherwise documented below in the visit note. 

## 2016-08-14 NOTE — Patient Instructions (Addendum)
You appear to have a sinus infection. I am prescribing antibiotic doxycycline  for the infection. To help with the nasal congestion I prescribed nasal steroid flonase. For your associated cough, I prescribed cough medicine benzonatate.  Rest, hydrate, tylenol for fever.  Recommend cxr to evaluate your mild sob at night and having to sleep more upright position.(notify us of any worse breathing issues or swelling of your legs or pain behind your knees)  If you have any wheezing then use albuterol  At your request for chronic sinus infections refer to ENT.   Follow up in 7 days or as needed.

## 2016-08-15 ENCOUNTER — Telehealth: Payer: Self-pay | Admitting: Neurology

## 2016-08-15 NOTE — Telephone Encounter (Signed)
I don't think it is topamax.  If the symptoms have completely resolved, she should see opthalmology or contact her PCP for referral.

## 2016-08-15 NOTE — Telephone Encounter (Signed)
Message relayed to patient. Verbalized understanding and denied questions.   

## 2016-08-15 NOTE — Telephone Encounter (Signed)
Pt called to report vision issues. Last night when returning home from pcp's office (seen for sinus infection) pt began having difficulty seeing. Pt reported that it was dark, and lots of traffic. Pt began experiencing "double vision". Stated she was unable to see anything but lights, had to pull over and call someone to come get her. Pt wanted to know if it could be related to medication. Pt has not had any other vision issues or other side effects since switching to Topamax on 07/17/16. Please advise.

## 2016-08-15 NOTE — Telephone Encounter (Signed)
PT called and said she is having some vision problems/Dawn [phone]

## 2016-08-15 NOTE — Progress Notes (Signed)
Pt has seen results on MyChart and message also sent for patient to call back if any questions.

## 2016-08-27 ENCOUNTER — Telehealth: Payer: Self-pay | Admitting: Family

## 2016-08-27 ENCOUNTER — Ambulatory Visit: Payer: Medicare Other | Admitting: Family

## 2016-08-27 NOTE — Telephone Encounter (Signed)
Patient left message on VM 12/12 @ 8:58 stating she could not make her 9:30 appointment today because of car problems. Charge or No Charge?

## 2016-08-27 NOTE — Telephone Encounter (Signed)
No charge. 

## 2016-08-28 ENCOUNTER — Other Ambulatory Visit: Payer: Self-pay | Admitting: Neurology

## 2016-08-28 DIAGNOSIS — J343 Hypertrophy of nasal turbinates: Secondary | ICD-10-CM | POA: Diagnosis not present

## 2016-08-28 DIAGNOSIS — J342 Deviated nasal septum: Secondary | ICD-10-CM | POA: Insufficient documentation

## 2016-08-28 DIAGNOSIS — H6983 Other specified disorders of Eustachian tube, bilateral: Secondary | ICD-10-CM | POA: Diagnosis not present

## 2016-08-28 DIAGNOSIS — H903 Sensorineural hearing loss, bilateral: Secondary | ICD-10-CM | POA: Diagnosis not present

## 2016-08-28 DIAGNOSIS — J3089 Other allergic rhinitis: Secondary | ICD-10-CM | POA: Diagnosis not present

## 2016-09-02 ENCOUNTER — Other Ambulatory Visit: Payer: Self-pay | Admitting: Neurology

## 2016-09-02 ENCOUNTER — Ambulatory Visit (INDEPENDENT_AMBULATORY_CARE_PROVIDER_SITE_OTHER): Payer: Medicare Other

## 2016-09-02 DIAGNOSIS — E538 Deficiency of other specified B group vitamins: Secondary | ICD-10-CM

## 2016-09-02 MED ORDER — CYANOCOBALAMIN 1000 MCG/ML IJ SOLN
1000.0000 ug | Freq: Once | INTRAMUSCULAR | Status: AC
  Administered 2016-09-02: 1000 ug via INTRAMUSCULAR

## 2016-09-10 DIAGNOSIS — H35363 Drusen (degenerative) of macula, bilateral: Secondary | ICD-10-CM | POA: Diagnosis not present

## 2016-09-10 DIAGNOSIS — H35373 Puckering of macula, bilateral: Secondary | ICD-10-CM | POA: Diagnosis not present

## 2016-09-10 DIAGNOSIS — H04123 Dry eye syndrome of bilateral lacrimal glands: Secondary | ICD-10-CM | POA: Diagnosis not present

## 2016-09-10 DIAGNOSIS — H25013 Cortical age-related cataract, bilateral: Secondary | ICD-10-CM | POA: Diagnosis not present

## 2016-09-10 DIAGNOSIS — M3501 Sicca syndrome with keratoconjunctivitis: Secondary | ICD-10-CM | POA: Diagnosis not present

## 2016-09-10 DIAGNOSIS — H2513 Age-related nuclear cataract, bilateral: Secondary | ICD-10-CM | POA: Diagnosis not present

## 2016-09-11 ENCOUNTER — Other Ambulatory Visit: Payer: Self-pay | Admitting: Family

## 2016-09-13 NOTE — Telephone Encounter (Signed)
Refill done for #30 days and patient informed done

## 2016-09-13 NOTE — Telephone Encounter (Signed)
Patient is completely out of this medication and is calling to follow up on the status of the refill. Please advise

## 2016-09-16 HISTORY — PX: NASAL SEPTUM SURGERY: SHX37

## 2016-09-18 ENCOUNTER — Ambulatory Visit: Payer: Medicare Other

## 2016-09-20 DIAGNOSIS — J343 Hypertrophy of nasal turbinates: Secondary | ICD-10-CM | POA: Diagnosis not present

## 2016-09-20 DIAGNOSIS — J342 Deviated nasal septum: Secondary | ICD-10-CM | POA: Diagnosis not present

## 2016-09-24 ENCOUNTER — Other Ambulatory Visit: Payer: Self-pay | Admitting: Neurology

## 2016-10-07 ENCOUNTER — Telehealth: Payer: Self-pay | Admitting: Family

## 2016-10-08 DIAGNOSIS — M3501 Sicca syndrome with keratoconjunctivitis: Secondary | ICD-10-CM | POA: Diagnosis not present

## 2016-10-08 DIAGNOSIS — H04123 Dry eye syndrome of bilateral lacrimal glands: Secondary | ICD-10-CM | POA: Diagnosis not present

## 2016-10-08 NOTE — Telephone Encounter (Signed)
eScribe request from [facility] for refill on Lexapro 20mg  Last filled - 09/13/16, #30x0 [Early] Last AEX - 07/16/16 Next AEX - Patient was to return in 6-WKS [08/27/16 - No Show] Please Advise on refills/SLS 01/23

## 2016-10-08 NOTE — Telephone Encounter (Signed)
Ok to send 1 week supply but needs office visit prior to additional refills.

## 2016-10-09 ENCOUNTER — Other Ambulatory Visit: Payer: Self-pay | Admitting: Family

## 2016-10-09 NOTE — Telephone Encounter (Signed)
Lindsay Frost-- please call pt to schedule f/u with Melissa ASAP. We only sent 7 day supply of lexapro and she needs to be seen before we can give her any more. Thanks!

## 2016-10-09 NOTE — Telephone Encounter (Signed)
Pt has been scheduled for Friday 10/11/16 at 1:30

## 2016-10-11 ENCOUNTER — Ambulatory Visit (INDEPENDENT_AMBULATORY_CARE_PROVIDER_SITE_OTHER): Payer: Medicare Other | Admitting: Family

## 2016-10-11 ENCOUNTER — Encounter: Payer: Self-pay | Admitting: Family

## 2016-10-11 DIAGNOSIS — F329 Major depressive disorder, single episode, unspecified: Secondary | ICD-10-CM

## 2016-10-11 DIAGNOSIS — G43009 Migraine without aura, not intractable, without status migrainosus: Secondary | ICD-10-CM

## 2016-10-11 DIAGNOSIS — F32A Depression, unspecified: Secondary | ICD-10-CM

## 2016-10-11 MED ORDER — ESCITALOPRAM OXALATE 20 MG PO TABS: ORAL_TABLET | ORAL | 5 refills | Status: DC

## 2016-10-11 MED ORDER — OMEPRAZOLE 40 MG PO CPDR: 40.0000 mg | DELAYED_RELEASE_CAPSULE | Freq: Every day | ORAL | 3 refills | Status: DC

## 2016-10-11 NOTE — Assessment & Plan Note (Signed)
Stable on increased dose of lexapro. She is advised to continue once daily dosing.

## 2016-10-11 NOTE — Assessment & Plan Note (Signed)
Stable on PPI, management per GI.

## 2016-10-11 NOTE — Progress Notes (Signed)
Pre visit review using our clinic review tool, if applicable. No additional management support is needed unless otherwise documented below in the visit note. 

## 2016-10-11 NOTE — Progress Notes (Signed)
Subjective:    Patient ID: Lindsay Frost, female    DOB: 1955/10/08, 61 y.o.   MRN: [id number]  HPI  Lindsay Frost is a 61 yr old female who presents today for follow up of her her depression. Last visit she notes that her depression was poorly controlled.  Lexapro was increased from 10mg  to 20mg . She notes that she got confused and was taking 20mg  bid by accident.  Her last dose was this AM. She notes that she had surgery for a deviated septum in early January. Since that time she is sleeping a lot better.   Elevated blood pressure- BP was elevated last visit in October.  BP Readings from Last 3 Encounters:  10/11/16 126/78  08/14/16 128/80  07/17/16 130/86    Notes GI is prescribing omeprazole and her reflux is well controlled.  Migraines- reports well controlled on the regimen that neurology has her on.   Review of Systems See HPI  Past Medical History:  Diagnosis Date  . Arthritis   . Back pain    arthritis  . Depression    takes cymbalta daily  . GERD (gastroesophageal reflux disease)    takes Omeprazole daily  . Headache(784.0)    migraines  . Heart murmur   . History of gastric ulcer   . History of migraine    last one about 2wks ago-takes Imitrex prn  . Joint pain   . Joint swelling   . Meningioma ([place])   . Ulcer [facility])      Social History   Social History  . Marital status: Single    Spouse name: N/A  . Number of children: N/A  . Years of education: N/A   Occupational History  . Not on file.   Social History Main Topics  . Smoking status: Never Smoker  . Smokeless tobacco: Never Used  . Alcohol use No  . Drug use: No  . Sexual activity: No   Other Topics Concern  . Not on file   Social History Narrative  . No narrative on file    Past Surgical History:  Procedure Laterality Date  . BALLOON DILATION  04/03/2012   Procedure: BALLOON DILATION;  Surgeon: Beryle Beams, MD;  [facility];  Service: Endoscopy;  Laterality: N/A;  .  COLONOSCOPY    . DIAGNOSTIC LAPAROSCOPY  45yrs ago  . ESOPHAGEAL MANOMETRY N/A 09/04/2015   Procedure: ESOPHAGEAL MANOMETRY (EM);  Surgeon: Carol Ada, MD;  [facility];  Service: Endoscopy;  Laterality: N/A;  . ESOPHAGOGASTRODUODENOSCOPY  04/03/2012   Procedure: ESOPHAGOGASTRODUODENOSCOPY (EGD);  Surgeon: Beryle Beams, MD;  [facility];  Service: Endoscopy;  Laterality: N/A;  EGD w/ balloon dilation  . ESOPHAGOGASTRODUODENOSCOPY (EGD) WITH PROPOFOL N/A 02/18/2014   Procedure: ESOPHAGOGASTRODUODENOSCOPY (EGD) WITH PROPOFOL;  Surgeon: Beryle Beams, MD;  [facility];  Service: Endoscopy;  Laterality: N/A;  . ESOPHAGOGASTRODUODENOSCOPY (EGD) WITH PROPOFOL N/A 04/22/2014   Procedure: ESOPHAGOGASTRODUODENOSCOPY (EGD) WITH PROPOFOL;  Surgeon: Beryle Beams, MD;  [facility];  Service: Endoscopy;  Laterality: N/A;  . [place] IMPEDANCE STUDY N/A 09/04/2015   Procedure: [place] IMPEDANCE STUDY;  Surgeon: Carol Ada, MD;  [facility];  Service: Endoscopy;  Laterality: N/A;  . REPLACEMENT TOTAL KNEE Right 54yrs ago  . TOTAL KNEE ARTHROPLASTY Left 11/30/2012   Procedure: LEFT TOTAL KNEE ARTHROPLASTY;  Surgeon: Kerin Salen, MD;  Location: [place];  Service: Orthopedics;  Laterality: Left;    Family History  Problem Relation  Age of Onset  . Arthritis Mother   . Cancer Mother     breast  . Heart disease Mother   . Depression Mother   . Heart disease Father   . Kidney disease Father     Allergies  Allergen Reactions  . Naratriptan     Ineffective    Current Outpatient Prescriptions on File Prior to Visit  Medication Sig Dispense Refill  . albuterol (PROVENTIL HFA;VENTOLIN HFA) 108 (90 Base) MCG/ACT inhaler Inhale 2 puffs into the lungs every 6 (six) hours as needed for wheezing or shortness of breath. 1 Inhaler 0  . atorvastatin (LIPITOR) 20 MG tablet Take 1 tablet (20 mg total) by mouth daily. 30 tablet 6  . eletriptan (RELPAX) 40 MG tablet TAKE 1  TABLET BY MOUTH AS NEEDED FOR MIGRAINE OF HEADACHE. MAY REPEAT IN 2 HOURS IF HEADACHE PERSISTS OR RECURS 10 tablet 0  . fluticasone (FLONASE) 50 MCG/ACT nasal spray SHAKE LIQUID AND USE 2 SPRAYS IN EACH NOSTRIL DAILY 16 g 1  . HYDROcodone-acetaminophen (NORCO/VICODIN) 5-325 MG per tablet 1 tablet. PRN  0  . ibuprofen (ADVIL,MOTRIN) 200 MG tablet Take 800 mg by mouth every 6 (six) hours as needed (Pain).    . naratriptan (AMERGE) 2.5 MG tablet TAKE 1 TABLET BY MOUTH AT ONSET OF MIGRAINE HEADACHE. MAY REPEAT ONCE AFTER 4 HORUS. DO NOT EXCEED 2 TABLETS IN 24 HOURS 10 tablet 0  . nortriptyline (PAMELOR) 50 MG capsule TAKE 1 CAPSULE(50 MG) BY MOUTH AT BEDTIME 90 capsule 0  . topiramate (TOPAMAX) 100 MG tablet Take 1 tablet (100 mg total) by mouth 2 (two) times daily. 60 tablet 2  . topiramate (TOPAMAX) 50 MG tablet AKE 1/2 TABLET TWICE DAILY X 7 DAYS, THEN 1 TABLET TWICE DAILY X 7 DAYS THEN 1 AND 1/2 TABLET TWICE DAILY X7 DAYS THEN 2 TABLETS TWICE DAILY (Patient taking differently: Take 100 mg by mouth 2 (two) times daily. AKE 1/2 TABLET TWICE DAILY X 7 DAYS, THEN 1 TABLET TWICE DAILY X 7 DAYS THEN 1 AND 1/2 TABLET TWICE DAILY X7 DAYS THEN 2 TABLETS TWICE DAILY) 360 tablet 1   No current facility-administered medications on file prior to visit.     BP 126/78 (BP Location: Right Arm, Cuff Size: Large)   Pulse 78   Temp 97.8 F (36.6 C) (Oral)   Resp 18   Wt 180 lb (81.6 kg)   SpO2 100% Comment: room air  BMI 30.90 kg/m       Objective:   Physical Exam  Constitutional: She appears well-developed and well-nourished.  Cardiovascular: Normal rate, regular rhythm and normal heart sounds.   No murmur heard. Pulmonary/Chest: Effort normal and breath sounds normal. No respiratory distress. She has no wheezes.  Psychiatric: She has a normal mood and affect. Her behavior is normal. Judgment and thought content normal.          Assessment & Plan:

## 2016-10-11 NOTE — Assessment & Plan Note (Signed)
Stable, management per GI 

## 2016-10-21 ENCOUNTER — Telehealth: Payer: Self-pay | Admitting: Family

## 2016-10-21 NOTE — Telephone Encounter (Signed)
Patient scheduled medicare wellness appointment with PCP 01/07/17

## 2016-10-22 ENCOUNTER — Other Ambulatory Visit: Payer: Self-pay | Admitting: Neurology

## 2016-10-23 ENCOUNTER — Ambulatory Visit (INDEPENDENT_AMBULATORY_CARE_PROVIDER_SITE_OTHER): Payer: Medicare Other | Admitting: *Deleted

## 2016-10-23 DIAGNOSIS — E538 Deficiency of other specified B group vitamins: Secondary | ICD-10-CM

## 2016-10-23 MED ORDER — CYANOCOBALAMIN 1000 MCG/ML IJ SOLN
1000.0000 ug | Freq: Once | INTRAMUSCULAR | Status: AC
  Administered 2016-10-23: 1000 ug via INTRAMUSCULAR

## 2016-10-23 NOTE — Progress Notes (Signed)
Patient here for B12 injection. Gave 109mcg of Cyanocobalamin in L Deltoid. Patient tolerated well. Band-Aid applied.

## 2016-11-09 ENCOUNTER — Other Ambulatory Visit: Payer: Self-pay | Admitting: Neurology

## 2016-11-11 ENCOUNTER — Other Ambulatory Visit: Payer: Self-pay | Admitting: *Deleted

## 2016-11-11 MED ORDER — TOPIRAMATE 100 MG PO TABS: 100.0000 mg | ORAL_TABLET | Freq: Two times a day (BID) | ORAL | 2 refills | Status: DC

## 2016-11-11 NOTE — Telephone Encounter (Signed)
Rx sent 

## 2016-11-20 ENCOUNTER — Other Ambulatory Visit: Payer: Self-pay | Admitting: Neurology

## 2016-12-03 ENCOUNTER — Other Ambulatory Visit: Payer: Self-pay | Admitting: Neurology

## 2016-12-05 ENCOUNTER — Ambulatory Visit (INDEPENDENT_AMBULATORY_CARE_PROVIDER_SITE_OTHER): Payer: Medicare Other | Admitting: *Deleted

## 2016-12-05 DIAGNOSIS — E538 Deficiency of other specified B group vitamins: Secondary | ICD-10-CM

## 2016-12-05 MED ORDER — CYANOCOBALAMIN 1000 MCG/ML IJ SOLN
1000.0000 ug | Freq: Once | INTRAMUSCULAR | Status: AC
  Administered 2016-12-05: 1000 ug via INTRAMUSCULAR

## 2016-12-05 NOTE — Progress Notes (Signed)
Pt here for B12 injection. Gave Cyanocobalamin 1057mcb in L Deltoid. Patient tolerated well. Band-Aid applied.

## 2016-12-11 DIAGNOSIS — J018 Other acute sinusitis: Secondary | ICD-10-CM | POA: Diagnosis not present

## 2016-12-12 ENCOUNTER — Telehealth: Payer: Self-pay

## 2016-12-12 ENCOUNTER — Encounter: Payer: Self-pay | Admitting: Neurology

## 2016-12-12 NOTE — Telephone Encounter (Signed)
Pt requested letter written to apt owner who recently implemented "no pets policy", stating why it is medically necessary for her to have pet she has had for the past 3 years. Called patient and advised letter ready for pick up at the front desk.

## 2016-12-21 ENCOUNTER — Other Ambulatory Visit: Payer: Self-pay | Admitting: Neurology

## 2016-12-26 ENCOUNTER — Other Ambulatory Visit: Payer: Self-pay | Admitting: Neurology

## 2017-01-06 ENCOUNTER — Telehealth: Payer: Self-pay

## 2017-01-06 NOTE — Telephone Encounter (Signed)
Pre-visit call completed with  patient. 

## 2017-01-07 ENCOUNTER — Ambulatory Visit: Payer: Medicare Other | Admitting: Family

## 2017-01-07 ENCOUNTER — Ambulatory Visit (INDEPENDENT_AMBULATORY_CARE_PROVIDER_SITE_OTHER): Payer: Medicare Other | Admitting: Family

## 2017-01-07 ENCOUNTER — Encounter: Payer: Self-pay | Admitting: Family

## 2017-01-07 VITALS — BP 118/63 | HR 68 | Temp 98.4°F | Resp 18 | Ht 63.5 in | Wt 165.2 lb

## 2017-01-07 DIAGNOSIS — K219 Gastro-esophageal reflux disease without esophagitis: Secondary | ICD-10-CM

## 2017-01-07 DIAGNOSIS — F329 Major depressive disorder, single episode, unspecified: Secondary | ICD-10-CM

## 2017-01-07 DIAGNOSIS — Z0000 Encounter for general adult medical examination without abnormal findings: Secondary | ICD-10-CM

## 2017-01-07 DIAGNOSIS — E785 Hyperlipidemia, unspecified: Secondary | ICD-10-CM

## 2017-01-07 DIAGNOSIS — G43909 Migraine, unspecified, not intractable, without status migrainosus: Secondary | ICD-10-CM

## 2017-01-07 DIAGNOSIS — E538 Deficiency of other specified B group vitamins: Secondary | ICD-10-CM

## 2017-01-07 DIAGNOSIS — Z5181 Encounter for therapeutic drug level monitoring: Secondary | ICD-10-CM

## 2017-01-07 DIAGNOSIS — G40909 Epilepsy, unspecified, not intractable, without status epilepticus: Secondary | ICD-10-CM

## 2017-01-07 DIAGNOSIS — E2839 Other primary ovarian failure: Secondary | ICD-10-CM

## 2017-01-07 DIAGNOSIS — F32A Depression, unspecified: Secondary | ICD-10-CM

## 2017-01-07 LAB — COMPREHENSIVE METABOLIC PANEL
ALT: 9 U/L (ref 0–35)
AST: 16 U/L (ref 0–37)
Albumin: 4.2 g/dL (ref 3.5–5.2)
Alkaline Phosphatase: 79 U/L (ref 39–117)
BUN: 10 mg/dL (ref 6–23)
CO2: 22 mEq/L (ref 19–32)
Calcium: 8.9 mg/dL (ref 8.4–10.5)
Chloride: 110 mEq/L (ref 96–112)
Creatinine, Ser: 0.74 mg/dL (ref 0.40–1.20)
GFR: 84.92 mL/min (ref >60.00)
Glucose, Bld: 78 mg/dL (ref 70–99)
Potassium: 3.5 mEq/L (ref 3.5–5.1)
Sodium: 141 mEq/L (ref 135–145)
Total Bilirubin: 0.3 mg/dL (ref 0.2–1.2)
Total Protein: 6.9 g/dL (ref 6.0–8.3)

## 2017-01-07 LAB — LIPID PANEL
Cholesterol: 246 mg/dL — ABNORMAL HIGH (ref 0–200)
HDL: 55.3 mg/dL (ref >39.00)
LDL Cholesterol: 173 mg/dL — ABNORMAL HIGH (ref 0–99)
NonHDL: 190.2
Total CHOL/HDL Ratio: 4
Triglycerides: 85 mg/dL (ref 0.0–149.0)
VLDL: 17 mg/dL (ref 0.0–40.0)

## 2017-01-07 MED ORDER — ASPIRIN EC 81 MG PO TBEC: 81.0000 mg | DELAYED_RELEASE_TABLET | Freq: Every day | ORAL | Status: DC

## 2017-01-07 MED ORDER — ZOSTER VAC RECOMB ADJUVANTED 50 MCG/0.5ML IM SUSR: INTRAMUSCULAR | 1 refills | Status: DC

## 2017-01-07 NOTE — Patient Instructions (Signed)
Please complete lab work prior to leaving. Continue healthy diet and exercise. Please complete the 2 dose series of Shingrix (shingles vaccine) prior to leaving.

## 2017-01-07 NOTE — Progress Notes (Signed)
Pre visit review using our clinic review tool, if applicable. No additional management support is needed unless otherwise documented below in the visit note. 

## 2017-01-07 NOTE — Progress Notes (Signed)
Subjective:    Lindsay Frost is a 61 y.o. female who presents for Medicare Annual/Subsequent preventive examination.  Preventive Screening-Counseling & Management  Tobacco History  Smoking Status  . Never Smoker  Smokeless Tobacco  . Never Used     Problems Prior to Visit 1. Migraine- on topamax, pamelor, amerge- management per Dr. Tomi Likens- neuro.  Reports that her migraines are stable.   2. Jerrye Bushy- maintained on omeprazole. Working with GI. Reports symptoms are stable.   3. Depression- maintained on lexapro.  Reports mood is good.   4.  Seizure disorder- Last seizure was about 1 year ago.  Maintained on topamax per neuro.   5. B12 deficiency- receiving injections per neuro.  6. Hyperlipidemia- maintained on lipitor.  Lab Results  Component Value Date   CHOL 245 (H) 07/22/2014   HDL 56.30 07/22/2014   LDLCALC 173 (H) 07/22/2014   TRIG 77.0 07/22/2014   CHOLHDL 4 07/22/2014     Patient presents today for complete physical.  Immunizations: up to date.  Diet: reports that her diet is healthy Exercise: reports that she walks her dogs daily.  Colonoscopy: 2013 normal per patient (followed by Dr. Benson Norway) Dexa: reports that this was done at [facility] about 5 yrs ago Pap Smear: 11/14- declines.   Mammogram: scheduled this week    Current Problems (verified) Patient Active Problem List   Diagnosis Date Noted  . Allergic rhinitis 07/16/2016  . Nocturnal leg cramps 02/20/2015  . Migraine without aura and without status migrainosus, not intractable 01/13/2015  . 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[place]) 01/13/2015  . Obesity (BMI 30-39.9) 07/22/2014  . Physical exam 07/22/2014  . Seizure-like activity ([place]) 05/12/2014  . Depression 12/14/2012  . GERD (gastroesophageal reflux disease) 12/14/2012  . Migraine 12/14/2012  . Muscle spasm 12/14/2012  . Osteoarthritis of left knee 12/02/2012    Medications Prior to Visit Current Outpatient  Prescriptions on File Prior to Visit  Medication Sig Dispense Refill  . albuterol (PROVENTIL HFA;VENTOLIN HFA) 108 (90 Base) MCG/ACT inhaler Inhale 2 puffs into the lungs every 6 (six) hours as needed for wheezing or shortness of breath. 1 Inhaler 0  . atorvastatin (LIPITOR) 20 MG tablet Take 1 tablet (20 mg total) by mouth daily. 30 tablet 6  . eletriptan (RELPAX) 40 MG tablet TAKE 1 TABLET BY MOUTH AS NEEDED FOR MIGRAINE OF HEADACHE. MAY REPEAT IN 2 HOURS IF HEADACHE PERSISTS OR RECURS 10 tablet 0  . escitalopram (LEXAPRO) 20 MG tablet TAKE 1 TABLET(20 MG) BY MOUTH DAILY 30 tablet 5  . fluorometholone (FML) 0.1 % ophthalmic suspension Place 1 drop into both eyes as needed.  1  . fluticasone (FLONASE) 50 MCG/ACT nasal spray SHAKE LIQUID AND USE 2 SPRAYS IN EACH NOSTRIL DAILY 16 g 1  . HYDROcodone-acetaminophen (NORCO/VICODIN) 5-325 MG per tablet 1 tablet. PRN  0  . ibuprofen (ADVIL,MOTRIN) 200 MG tablet Take 800 mg by mouth every 6 (six) hours as needed (Pain).    . naratriptan (AMERGE) 2.5 MG tablet TAKE 1 TABLET BY MOUTH AT ONSET OF MIGRAINE HEADACHE. MAY REPEAT ONCE AFTER 4 HORUS. DO NOT EXCEED 2 TABLETS IN 24 HOURS 10 tablet 0  . nortriptyline (PAMELOR) 50 MG capsule TAKE 1 CAPSULE(50 MG) BY MOUTH AT BEDTIME 90 capsule 0  . omeprazole (PRILOSEC) 40 MG capsule Take 1 capsule (40 mg total) by mouth daily. 30 capsule 3  . RESTASIS 0.05 % ophthalmic emulsion     . topiramate (TOPAMAX) 100 MG tablet Take  1 tablet (100 mg total) by mouth 2 (two) times daily. 60 tablet 2  . topiramate (TOPAMAX) 50 MG tablet AKE 1/2 TABLET TWICE DAILY X 7 DAYS, THEN 1 TABLET TWICE DAILY X 7 DAYS THEN 1 AND 1/2 TABLET TWICE DAILY X7 DAYS THEN 2 TABLETS TWICE DAILY (Patient taking differently: Take 100 mg by mouth 2 (two) times daily. AKE 1/2 TABLET TWICE DAILY X 7 DAYS, THEN 1 TABLET TWICE DAILY X 7 DAYS THEN 1 AND 1/2 TABLET TWICE DAILY X7 DAYS THEN 2 TABLETS TWICE DAILY) 360 tablet 1   No current facility-administered  medications on file prior to visit.     Current Medications (verified) Current Outpatient Prescriptions  Medication Sig Dispense Refill  . albuterol (PROVENTIL HFA;VENTOLIN HFA) 108 (90 Base) MCG/ACT inhaler Inhale 2 puffs into the lungs every 6 (six) hours as needed for wheezing or shortness of breath. 1 Inhaler 0  . atorvastatin (LIPITOR) 20 MG tablet Take 1 tablet (20 mg total) by mouth daily. 30 tablet 6  . eletriptan (RELPAX) 40 MG tablet TAKE 1 TABLET BY MOUTH AS NEEDED FOR MIGRAINE OF HEADACHE. MAY REPEAT IN 2 HOURS IF HEADACHE PERSISTS OR RECURS 10 tablet 0  . escitalopram (LEXAPRO) 20 MG tablet TAKE 1 TABLET(20 MG) BY MOUTH DAILY 30 tablet 5  . fluorometholone (FML) 0.1 % ophthalmic suspension Place 1 drop into both eyes as needed.  1  . fluticasone (FLONASE) 50 MCG/ACT nasal spray SHAKE LIQUID AND USE 2 SPRAYS IN EACH NOSTRIL DAILY 16 g 1  . HYDROcodone-acetaminophen (NORCO/VICODIN) 5-325 MG per tablet 1 tablet. PRN  0  . ibuprofen (ADVIL,MOTRIN) 200 MG tablet Take 800 mg by mouth every 6 (six) hours as needed (Pain).    . naratriptan (AMERGE) 2.5 MG tablet TAKE 1 TABLET BY MOUTH AT ONSET OF MIGRAINE HEADACHE. MAY REPEAT ONCE AFTER 4 HORUS. DO NOT EXCEED 2 TABLETS IN 24 HOURS 10 tablet 0  . nortriptyline (PAMELOR) 50 MG capsule TAKE 1 CAPSULE(50 MG) BY MOUTH AT BEDTIME 90 capsule 0  . omeprazole (PRILOSEC) 40 MG capsule Take 1 capsule (40 mg total) by mouth daily. 30 capsule 3  . RESTASIS 0.05 % ophthalmic emulsion     . topiramate (TOPAMAX) 100 MG tablet Take 1 tablet (100 mg total) by mouth 2 (two) times daily. 60 tablet 2  . topiramate (TOPAMAX) 50 MG tablet AKE 1/2 TABLET TWICE DAILY X 7 DAYS, THEN 1 TABLET TWICE DAILY X 7 DAYS THEN 1 AND 1/2 TABLET TWICE DAILY X7 DAYS THEN 2 TABLETS TWICE DAILY (Patient taking differently: Take 100 mg by mouth 2 (two) times daily. AKE 1/2 TABLET TWICE DAILY X 7 DAYS, THEN 1 TABLET TWICE DAILY X 7 DAYS THEN 1 AND 1/2 TABLET TWICE DAILY X7 DAYS THEN 2  TABLETS TWICE DAILY) 360 tablet 1   No current facility-administered medications for this visit.      Allergies (verified) Naratriptan   PAST HISTORY  Family History Family History  Problem Relation Age of Onset  . Arthritis Mother   . Cancer Mother     breast  . Heart disease Mother   . Depression Mother   . Heart disease Father   . Kidney disease Father     Social History Social History  Substance Use Topics  . Smoking status: Never Smoker  . Smokeless tobacco: Never Used  . Alcohol use No     Are there smokers in your home (other than you)? No  Risk Factors Current exercise habits: walks regularly  Dietary issues discussed: continue healthy diet   Cardiac risk factors: + family hx of CAD in both parents.  Depression Screen (Note: if answer to either of the following is "Yes", a more complete depression screening is indicated)   Over the past two weeks, have you felt down, depressed or hopeless? Yes  Not happy where she lives, reports that she lives in a "bad neighborhood." Trying to move out  Over the past two weeks, have you felt little interest or pleasure in doing things? Yes  Have you lost interest or pleasure in daily life? No  Do you often feel hopeless? No  Do you cry easily over simple problems? Yes- attributes this to her personality  Activities of Daily Living In your present state of health, do you have any difficulty performing the following activities?:  Driving? No Managing money?  No Feeding yourself? No Getting from bed to chair? No . Climbing a flight of stairs? No Preparing food and eating?: No Bathing or showering? No Getting dressed: No Getting to the toilet? No Using the toilet:No Moving around from place to place: No In the past year have you fallen or had a near fall?:No   Are you sexually active?  No  Do you have more than one partner?  No  Hearing Difficulties: No Do you often ask people to speak up or repeat themselves?  Yes Do you experience ringing or noises in your ears? No Do you have difficulty understanding soft or whispered voices? Yes  Declines audiology referral   Do you feel that you have a problem with memory? no  Do you often misplace items? No  Do you feel safe at home?  No- working on finding a new apartment. Currently on [address] housing  Cognitive Testing  Alert? Yes  Normal Appearance?Yes  Oriented to person? Yes  Place? Yes   Time? Yes  Recall of three objects?  Yes  Can perform simple calculations? Yes  Displays appropriate judgment?Yes  Can read the correct time from a watch face?Yes   Advanced Directives have been discussed with the patient? No  List the Names of Other Physician/Practitioners you currently use: 1.    Indicate any recent Medical Services you may have received from other than [facility] providers in the past year (date may be approximate).  Immunization History  Administered Date(s) Administered  . Influenza,inj,Quad PF,36+ Mos 06/17/2016  . Tdap 08/05/2013    Screening Tests Health Maintenance  Topic Date Due  . MAMMOGRAM  02/06/2017 (Originally 02/20/2015)  . PAP SMEAR  07/09/2017 (Originally 07/22/2016)  . INFLUENZA VACCINE  04/16/2017  . COLONOSCOPY  02/17/2022  . TETANUS/TDAP  08/06/2023  . Hepatitis C Screening  Completed  . HIV Screening  Completed    All answers were reviewed with the patient and necessary referrals were made:  O'SULLIVAN,Lindsay Massmann S., Lindsay Frost   01/07/2017   History reviewed: allergies, current medications, past family history, past medical history, past social history, past surgical history and problem list  Review of Systems Pertinent items are noted in HPI.    Objective:   Body mass index is 28.8 kg/m. BP 118/63 (BP Location: Right Arm, Cuff Size: Large)   Pulse 68   Temp 98.4 F (36.9 C) (Oral)   Resp 18   Ht 5' 3.5" (1.613 m)   Wt 165 lb 3.2 oz (74.9 kg)   SpO2 100% Comment: room air  BMI 28.80 kg/m   Physical Exam   Constitutional: She is oriented to person, place,  and time. She appears well-developed and well-nourished. No distress. Appears older than stated age.  HENT:  Head: Normocephalic and atraumatic.  Right Ear: Tympanic membrane and ear canal normal.  Left Ear: Tympanic membrane and ear canal normal.  Mouth/Throat: Oropharynx is clear and moist.  Eyes: Pupils are equal, round, and reactive to light. No scleral icterus.  Neck: Normal range of motion. No thyromegaly present.  Cardiovascular: Normal rate and regular rhythm.   No murmur heard. Pulmonary/Chest: Effort normal and breath sounds normal. No respiratory distress. He has no wheezes. She has no rales. She exhibits no tenderness.  Abdominal: Soft. Bowel sounds are normal. She exhibits no distension and no mass. There is no tenderness. There is no rebound and no guarding.  Musculoskeletal: She exhibits no edema.  Lymphadenopathy:    She has no cervical adenopathy.  Neurological: She is alert and oriented to person, place, and time. She has normal patellar reflexes. She exhibits normal muscle tone. Coordination normal.  Skin: Skin is warm and dry.  Psychiatric: She has a slightly flattened affect. Her behavior is normal. Judgment and thought content normal.  Breasts: Examined lying Right: Without masses, retractions, discharge or axillary adenopathy.  Left: Without masses, retractions, discharge or axillary adenopathy. Pelvic: deferred          Assessment & Plan:   Migraine- stable, management per neurology.  Gerd- stable on omeprazole, management per GI.   Depression- stable. Continue lexapro.   b12 deficiency- continues injections per neuro.   Hyperlipidemia- due for follow up. Obtain follow up lipid panel.   Seizure disorder- stable on topamax- management per neuro. Obtain follow up cmet.     Assessment:          Plan:     During the course of the visit the patient was educated and counseled about appropriate  screening and preventive services including:    Advanced directives: no advanced directives, paperwork given  Diet review for nutrition referral? Yes ____  Not Indicated __x__   Patient Instructions (the written plan) was given to the patient.  Medicare Attestation I have personally reviewed: The patient's medical and social history Their use of alcohol, tobacco or illicit drugs Their current medications and supplements The patient's functional ability including ADLs,fall risks, home safety risks, cognitive, and hearing and visual impairment Diet and physical activities Evidence for depression or mood disorders  The patient's weight, height, BMI, and visual acuity have been recorded in the chart.  I have made referrals, counseling, and provided education to the patient based on review of the above and I have provided the patient with a written personalized care plan for preventive services.     O'SULLIVAN,Mivaan Corbitt S., Lindsay Frost   01/07/2017

## 2017-01-08 DIAGNOSIS — M3501 Sicca syndrome with keratoconjunctivitis: Secondary | ICD-10-CM | POA: Diagnosis not present

## 2017-01-08 DIAGNOSIS — H04123 Dry eye syndrome of bilateral lacrimal glands: Secondary | ICD-10-CM | POA: Diagnosis not present

## 2017-01-08 DIAGNOSIS — H524 Presbyopia: Secondary | ICD-10-CM | POA: Diagnosis not present

## 2017-01-09 DIAGNOSIS — Z1231 Encounter for screening mammogram for malignant neoplasm of breast: Secondary | ICD-10-CM | POA: Diagnosis not present

## 2017-01-09 DIAGNOSIS — Z803 Family history of malignant neoplasm of breast: Secondary | ICD-10-CM | POA: Diagnosis not present

## 2017-01-09 LAB — HM MAMMOGRAPHY

## 2017-01-12 ENCOUNTER — Telehealth: Payer: Self-pay | Admitting: Family

## 2017-01-12 DIAGNOSIS — E785 Hyperlipidemia, unspecified: Secondary | ICD-10-CM

## 2017-01-12 NOTE — Telephone Encounter (Signed)
Please let patient know that her blood work show cholesterol is elevated.  Please confirm that she is taking lipitor 20mg  once daily.  If so, please increase lipitor to 40mg  once daily, repeat lipids in 6 weeks, dx hyperlipidemia. Kidney function and electrolytes look good.

## 2017-01-14 ENCOUNTER — Encounter: Payer: Self-pay | Admitting: Neurology

## 2017-01-14 ENCOUNTER — Ambulatory Visit (INDEPENDENT_AMBULATORY_CARE_PROVIDER_SITE_OTHER): Payer: Medicare Other | Admitting: Neurology

## 2017-01-14 ENCOUNTER — Ambulatory Visit: Payer: Medicare Other

## 2017-01-14 ENCOUNTER — Encounter: Payer: Self-pay | Admitting: General Practice

## 2017-01-14 VITALS — BP 112/80 | HR 71 | Temp 97.8°F | Ht 63.5 in | Wt 162.0 lb

## 2017-01-14 DIAGNOSIS — E538 Deficiency of other specified B group vitamins: Secondary | ICD-10-CM | POA: Diagnosis not present

## 2017-01-14 DIAGNOSIS — G40209 Localization-related (focal) (partial) symptomatic epilepsy and epileptic syndromes with complex partial seizures, not intractable, without status epilepticus: Secondary | ICD-10-CM

## 2017-01-14 DIAGNOSIS — G43009 Migraine without aura, not intractable, without status migrainosus: Secondary | ICD-10-CM | POA: Diagnosis not present

## 2017-01-14 MED ORDER — CYANOCOBALAMIN 1000 MCG/ML IJ SOLN
1000.0000 ug | Freq: Once | INTRAMUSCULAR | Status: AC
  Administered 2017-01-14: 1000 ug via INTRAMUSCULAR

## 2017-01-14 NOTE — Telephone Encounter (Signed)
Noted. No further instructions. 

## 2017-01-14 NOTE — Progress Notes (Signed)
NEUROLOGY FOLLOW UP OFFICE NOTE  Lindsay Frost [id number]  HISTORY OF PRESENT ILLNESS: Lindsay Frost is a 61 year old right-handed woman with history of depression, migraine, and GERD who follows up for migraine without aura and seizure.     UPDATE: Last visit, we started topiramate and tapered off of Keppra.  She reported episode of visual disturbance in which she experienced double vision and unable to see anything but lights.  This occurred while driving at night.  She was referred to ophthalmology.  She was found to have mild cataracts and dry eye syndrome, but otherwise unremarkable.  Topamax has helped the headaches.   Migraine: Improved. Moderate intensity. Lasts 2 hours with Relpax Occurs twice a month Current abortive therapy: Relpax Current preventative:  topiramate 100mg  twice daily, nortriptyline 50mg  at bedtime   Seizures: No seizures.  Last spell occurred 05/27/15.   Current preventative: topiramate 100mg  twice daily   Memory:  TSH was 1.16.  However, B12 was 125.  She was started on injections.  Repeat B12 from 07/17/16 was over 1500.  Memory is okay.  HISTORY: Migraine: Onset:  Early 40s Location:  Left frontal Quality:  Stabbing Initial Intensity:  10 out of 10; September 7/10 Aura:  No Prodrome:  Photophobia Associated symptoms:  Nausea, phonophobia, photophobia, sometimes vomiting. No osmophobia, visual disturbance, or autonomic symptoms. Initial Duration:  2-3 days; September 3 hours with Relpax Initial Frequency:  Migraines occur every 2 weeks (4-6 days per month) and dull headaches occur 2 times a week (8 days per month). Total of 14 headache days per month; September 3 times a month Triggers/exacerbating factors:  Heat Relieving factors:  Laying down in a quiet, dark, cool room with ice pack. Activity:  Cannot function when she has migraine   Past abortive therapy:  sumatriptan 50mg  (ineffective, caused racing heart), Maxalt (ineffective), BC  powder Past preventative therapy:  Topamax (side effects, ineffective), Cymbalta (for depression) Unable to start Inderal LA because insurance wouldn't cover it.   Caffeine:  Coffee, tea, soda Alcohol:  no Smoker:  no Diet:  Junk food Exercise:  Walks dog Depression/stress:  controlled Sleep hygiene:  Good.  Sleeps 10 hours, which she feels is too much (particularly if she has a migraine) Family history of headache:  daughter   Seizure: Her Wellbutrin was increased in August to help control her depression.    At around that time, she had an episode where she woke up and found herself on the bathroom floor.  She didn't remember falling.  Later in the month, she had another episode where she woke up on the floor next to her bed and noted that she was briefly kicking both legs.  She had a severe rug burn on the left side of her face.  She did not bite her tongue during either event but she did have some urinary incontinence during the episode in the bathroom.  Her PCP, Dr. Birdie Riddle, subsequently decreased her dose of Wellbutrin.  She has no prior history of seizures.  She had a sleep deprived EEG performed on 06/07/14, which showed brief intermittent transient sharp waves and slowing in the left temporal region, which although not epileptiform, could be a seizure focus.   On 05/27/15, she had a different spell.  It was unwitnessed.  When she woke up, she was short of breath and it took a few minutes to recover.  Sometimes, she will wake up short of breath, but this episode was worse.  She did not  have incontinence or tongue biting.     MRI of brain with and without contrast from 08/19/13 demonstrated a 12 mm left parietal calcified meningioma without surrounding vasogenic edema, minimally increased compared to prior study from 10/2003. MRI of the brain with and without contrast was performed on 06/08/14, which demonstrated stability of the meningioma.  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  PAST MEDICAL HISTORY: Past Medical History:  Diagnosis Date  . Arthritis   . Back pain    arthritis  . Depression    takes cymbalta daily  . GERD (gastroesophageal reflux disease)    takes Omeprazole daily  . Headache(784.0)    migraines  . Heart murmur   . History of gastric ulcer   . History of migraine    last one about 2wks ago-takes Imitrex prn  . Joint pain   . Joint swelling   . Meningioma ([place])   . Ulcer     MEDICATIONS: Current Outpatient Prescriptions on File Prior to Visit  Medication Sig Dispense Refill  . albuterol (PROVENTIL HFA;VENTOLIN HFA) 108 (90 Base) MCG/ACT inhaler Inhale 2 puffs into the lungs every 6 (six) hours as needed for wheezing or shortness of breath. 1 Inhaler 0  . aspirin EC 81 MG tablet Take 1 tablet (81 mg total) by mouth daily.    Marland Kitchen atorvastatin (LIPITOR) 20 MG tablet Take 1 tablet (20 mg total) by mouth daily. 30 tablet 6  . eletriptan (RELPAX) 40 MG tablet TAKE 1 TABLET BY MOUTH AS NEEDED FOR MIGRAINE OF HEADACHE. MAY REPEAT IN 2 HOURS IF HEADACHE PERSISTS OR RECURS 10 tablet 0  . escitalopram (LEXAPRO) 20 MG tablet TAKE 1 TABLET(20 MG) BY MOUTH DAILY 30 tablet 5  . fluorometholone (FML) 0.1 % ophthalmic suspension Place 1 drop into both eyes as needed.  1  . fluticasone (FLONASE) 50 MCG/ACT nasal spray SHAKE LIQUID AND USE 2 SPRAYS IN EACH NOSTRIL DAILY 16 g 1  . HYDROcodone-acetaminophen (NORCO/VICODIN) 5-325 MG per tablet 1 tablet. PRN  0  . ibuprofen (ADVIL,MOTRIN) 200 MG tablet Take 800 mg by mouth every 6 (six) hours as needed (Pain).    . nortriptyline (PAMELOR) 50 MG capsule TAKE 1 CAPSULE(50 MG) BY MOUTH AT BEDTIME 90 capsule 0  . omeprazole (PRILOSEC) 40 MG capsule Take 1 capsule (40 mg total) by mouth daily. 30 capsule 3  . RESTASIS 0.05 % ophthalmic emulsion     . topiramate (TOPAMAX) 100 MG tablet Take 1 tablet  (100 mg total) by mouth 2 (two) times daily. 60 tablet 2  . topiramate (TOPAMAX) 50 MG tablet AKE 1/2 TABLET TWICE DAILY X 7 DAYS, THEN 1 TABLET TWICE DAILY X 7 DAYS THEN 1 AND 1/2 TABLET TWICE DAILY X7 DAYS THEN 2 TABLETS TWICE DAILY (Patient taking differently: Take 100 mg by mouth 2 (two) times daily. AKE 1/2 TABLET TWICE DAILY X 7 DAYS, THEN 1 TABLET TWICE DAILY X 7 DAYS THEN 1 AND 1/2 TABLET TWICE DAILY X7 DAYS THEN 2 TABLETS TWICE DAILY) 360 tablet 1  . Zoster Vac Recomb Adjuvanted [facility]) injection Inject 57mcg IM now and again in 2-6 months 0.5 mL 1   No current facility-administered medications on file prior to visit.     ALLERGIES: Allergies  Allergen Reactions  . Naratriptan     Ineffective    FAMILY HISTORY: Family History  Problem Relation Age  of Onset  . Arthritis Mother   . Cancer Mother     breast  . Heart disease Mother     died from "heart problems" at age 12, had CAD  . Depression Mother   . Heart disease Father     had CABG  . Kidney disease Father   . Arthritis Sister   . Heart murmur Sister   . Arthritis Brother     "serious back/neck problems"  . Arthritis Sister   . Aneurysm Sister     SOCIAL HISTORY: Social History   Social History  . Marital status: Single    Spouse name: N/A  . Number of children: N/A  . Years of education: N/A   Occupational History  . Not on file.   Social History Main Topics  . Smoking status: Never Smoker  . Smokeless tobacco: Never Used  . Alcohol use No  . Drug use: No  . Sexual activity: No   Other Topics Concern  . 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, mining   Single   1 daughter- lives in [place]   2 dogs   Enjoys reading, watching television    REVIEW OF SYSTEMS: Constitutional: No fevers, chills, or sweats, no generalized fatigue, change in appetite Eyes: No visual changes, double vision, eye pain Ear, nose and throat: No hearing  loss, ear pain, nasal congestion, sore throat Cardiovascular: No chest pain, palpitations Respiratory:  No shortness of breath at rest or with exertion, wheezes GastrointestinaI: No nausea, vomiting, diarrhea, abdominal pain, fecal incontinence Genitourinary:  No dysuria, urinary retention or frequency Musculoskeletal:  No neck pain, back pain Integumentary: No rash, pruritus, skin lesions Neurological: as above Psychiatric: No depression, insomnia, anxiety Endocrine: No palpitations, fatigue, diaphoresis, mood swings, change in appetite, change in weight, increased thirst Hematologic/Lymphatic:  No purpura, petechiae. Allergic/Immunologic: no itchy/runny eyes, nasal congestion, recent allergic reactions, rashes  PHYSICAL EXAM: Vitals:   01/14/17 1100  BP: 112/80  Pulse: 71  Temp: 97.8 F (36.6 C)   General: No acute distress.  Patient appears well-groomed.  normal body habitus. Head:  Normocephalic/atraumatic Eyes:  Fundi examined but not visualized Neck: supple, no paraspinal tenderness, full range of motion Heart:  Regular rate and rhythm Lungs:  Clear to auscultation bilaterally Back: No paraspinal tenderness Neurological Exam: alert and oriented to person, place, and time. Attention span and concentration intact, recent and remote memory intact, fund of knowledge intact.  Speech fluent and not dysarthric, language intact.  CN II-XII intact. Bulk and tone normal, muscle strength 5/5 throughout.  Sensation to light touch  intact.  Deep tendon reflexes 2+ throughout.  Finger to nose testing intact.  Gait normal, Romberg negative.  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 Continue topiramate 100mg  twice daily 2.  Discontinue nortriptyline.  Likely not needed for migraine prevention, as she is on topiramate. 3.  Relpax as needed for acute migraines 4.  B12 supplementation.  Repeat level prior to next  visit. 5.  Follow up in 8 months.  Metta Clines, DO  CC: Debbrah Alar, NP

## 2017-01-14 NOTE — Patient Instructions (Addendum)
1.  Continue Topamax 100mg  twice daily 2.  Use the Relpax for acute migraines 3.  Follow up in 8 months. 4.  Go to the lab prior to next appointment.

## 2017-01-14 NOTE — Telephone Encounter (Signed)
Notified pt of below and she voices understanding. Reports that she has not been taking cholesterol medication and doesn't ever remember taking it. Advised her I would send 20mg  Rx for her to start and scheduled lab appt for 02/25/17 at 11am. Future order entered. Please advise if any other instruction?

## 2017-02-25 ENCOUNTER — Other Ambulatory Visit: Payer: Medicare Other

## 2017-03-01 ENCOUNTER — Other Ambulatory Visit: Payer: Self-pay | Admitting: Neurology

## 2017-04-03 ENCOUNTER — Other Ambulatory Visit: Payer: Self-pay | Admitting: Neurology

## 2017-04-04 ENCOUNTER — Other Ambulatory Visit: Payer: Self-pay | Admitting: Medical

## 2017-04-07 NOTE — Telephone Encounter (Signed)
Pt is due for follow up call and schedule appointment please.

## 2017-04-08 NOTE — Telephone Encounter (Signed)
Patient scheduled for 04/22/2017 with PCP

## 2017-04-10 ENCOUNTER — Other Ambulatory Visit: Payer: Self-pay | Admitting: Family

## 2017-04-16 ENCOUNTER — Other Ambulatory Visit: Payer: Self-pay | Admitting: Family

## 2017-04-16 NOTE — Telephone Encounter (Signed)
Ok to refill. Likely needs appt with Melissa 6 mo from her most recent CPE for med check. TY.

## 2017-04-21 NOTE — Telephone Encounter (Signed)
My chart message sent to pt.

## 2017-04-22 ENCOUNTER — Encounter: Payer: Self-pay | Admitting: Family

## 2017-04-22 ENCOUNTER — Ambulatory Visit (HOSPITAL_BASED_OUTPATIENT_CLINIC_OR_DEPARTMENT_OTHER)
Admission: RE | Admit: 2017-04-22 | Discharge: 2017-04-22 | Disposition: A | Discharge status: Home / Self Care | Payer: Medicare Other | Source: Ambulatory Visit | Attending: Family | Admitting: Family

## 2017-04-22 ENCOUNTER — Ambulatory Visit (INDEPENDENT_AMBULATORY_CARE_PROVIDER_SITE_OTHER): Payer: Medicare Other | Admitting: Family

## 2017-04-22 VITALS — BP 108/78 | HR 69 | Temp 98.2°F | Ht 64.0 in | Wt 155.0 lb

## 2017-04-22 DIAGNOSIS — R059 Cough, unspecified: Secondary | ICD-10-CM

## 2017-04-22 DIAGNOSIS — R05 Cough: Secondary | ICD-10-CM | POA: Insufficient documentation

## 2017-04-22 DIAGNOSIS — J40 Bronchitis, not specified as acute or chronic: Secondary | ICD-10-CM

## 2017-04-22 DIAGNOSIS — R079 Chest pain, unspecified: Secondary | ICD-10-CM | POA: Diagnosis not present

## 2017-04-22 MED ORDER — CEFDINIR 300 MG PO CAPS: 300.0000 mg | ORAL_CAPSULE | Freq: Two times a day (BID) | ORAL | 0 refills | Status: DC

## 2017-04-22 MED ORDER — ATORVASTATIN CALCIUM 20 MG PO TABS: 20.0000 mg | ORAL_TABLET | Freq: Every day | ORAL | 6 refills | Status: DC

## 2017-04-22 NOTE — Progress Notes (Signed)
Subjective:    Patient ID: Lindsay Frost, female    DOB: 1956/01/08, 61 y.o.   MRN: [id number]  HPI   Lindsay Frost is a 61 yr old female who presents today with chief complaint of cough and chest congestion. Reports cough x 2 weeks. Denies associated fever. Using mucinex with brief improvement  Reports some pleuritic chest discomfort.      Review of Systems See hpi  Past Medical History:  Diagnosis Date  . Arthritis   . Back pain    arthritis  . Depression    takes cymbalta daily  . GERD (gastroesophageal reflux disease)    takes Omeprazole daily  . Headache(784.0)    migraines  . Heart murmur   . History of gastric ulcer   . History of migraine    last one about 2wks ago-takes Imitrex prn  . Joint pain   . Joint swelling   . Meningioma ([place])   . Ulcer      Social History   Social History  . Marital status: Single    Spouse name: N/A  . Number of children: N/A  . Years of education: N/A   Occupational History  . Not on file.   Social History Main Topics  . Smoking status: Never Smoker  . Smokeless tobacco: Never Used  . Alcohol use No  . Drug use: No  . Sexual activity: No   Other Topics Concern  . 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 BALLOON DILATION  04/03/2012   Procedure: BALLOON DILATION;  Surgeon: Beryle Beams, MD;  Location: [facility];  Service: Endoscopy;  Laterality: N/A;  . COLONOSCOPY    . DIAGNOSTIC LAPAROSCOPY  59yrs ago  . ESOPHAGEAL MANOMETRY N/A 09/04/2015   Procedure: ESOPHAGEAL MANOMETRY (EM);  Surgeon: Carol Ada, MD;  Location: [facility];  Service: Endoscopy;  Laterality: N/A;  . ESOPHAGOGASTRODUODENOSCOPY  04/03/2012   Procedure: ESOPHAGOGASTRODUODENOSCOPY (EGD);  Surgeon: Beryle Beams, MD;  Location: [facility];  Service: Endoscopy;  Laterality: N/A;  EGD w/ balloon dilation  . ESOPHAGOGASTRODUODENOSCOPY (EGD) WITH PROPOFOL N/A 02/18/2014   Procedure: ESOPHAGOGASTRODUODENOSCOPY (EGD) WITH PROPOFOL;  Surgeon: Beryle Beams, MD;  Location: [facility];  Service: Endoscopy;  Laterality: N/A;  . ESOPHAGOGASTRODUODENOSCOPY (EGD) WITH PROPOFOL N/A 04/22/2014   Procedure: ESOPHAGOGASTRODUODENOSCOPY (EGD) WITH PROPOFOL;  Surgeon: Beryle Beams, MD;  Location: [facility];  Service: Endoscopy;  Laterality: N/A;  . NASAL SEPTUM SURGERY  09/2016  . [place] IMPEDANCE STUDY N/A 09/04/2015   Procedure: [place] IMPEDANCE STUDY;  Surgeon: Carol Ada, MD;  Location: [facility];  Service: Endoscopy;  Laterality: N/A;  . REPLACEMENT TOTAL KNEE Right 26yrs ago  . TOTAL KNEE ARTHROPLASTY Left 11/30/2012   Procedure: LEFT TOTAL KNEE ARTHROPLASTY;  Surgeon: Kerin Salen, MD;  Location: [place];  Service: Orthopedics;  Laterality: Left;    Family History  Problem Relation Age of Onset  . Arthritis Mother   . Cancer Mother        breast  . Heart disease Mother        died from "heart problems" at age 7, had CAD  . Depression Mother   . Heart disease Father        had  CABG  . Kidney disease Father   . Arthritis Sister   . Heart murmur Sister   . Arthritis Brother        "serious back/neck problems"  . Arthritis Sister   . Aneurysm Sister     Allergies  Allergen Reactions  . Naratriptan     Ineffective    Current Outpatient Prescriptions on File Prior to Visit  Medication Sig Dispense Refill  . albuterol (PROVENTIL HFA;VENTOLIN HFA) 108 (90 Base) MCG/ACT inhaler Inhale 2 puffs into the lungs every 6 (six) hours as needed for wheezing or shortness of breath. 1 Inhaler 0  . aspirin EC 81 MG tablet Take 1 tablet (81 mg total) by mouth daily.    Marland Kitchen atorvastatin (LIPITOR) 20 MG tablet Take 1 tablet (20 mg total) by mouth daily. 30 tablet 6  . eletriptan (RELPAX) 40 MG tablet TAKE 1 TABLET BY MOUTH AS NEEDED FOR  MIGRAINE OF HEADACHE. MAY REPEAT IN 2 HOURS IF HEADACHE PERSISTS OR RECURS 10 tablet 0  . eletriptan (RELPAX) 40 MG tablet TAKE 1 TABLET BY MOUTH AS NEEDED FOR MIGRAINE OF HEADACHE. MAY REPEAT IN 2 HOURS IF HEADACHE PERSISTS OR RECURS 10 tablet 0  . escitalopram (LEXAPRO) 20 MG tablet TAKE 1 TABLET(20 MG) BY MOUTH DAILY 90 tablet 0  . fluorometholone (FML) 0.1 % ophthalmic suspension Place 1 drop into both eyes as needed.  1  . fluticasone (FLONASE) 50 MCG/ACT nasal spray SHAKE LIQUID AND USE 2 SPRAYS IN EACH NOSTRIL DAILY 16 g 1  . fluticasone (FLONASE) 50 MCG/ACT nasal spray SHAKE LIQUID AND USE 2 SPRAYS IN EACH NOSTRIL DAILY 48 g 0  . HYDROcodone-acetaminophen (NORCO/VICODIN) 5-325 MG per tablet 1 tablet. PRN  0  . ibuprofen (ADVIL,MOTRIN) 200 MG tablet Take 800 mg by mouth every 6 (six) hours as needed (Pain).    . nortriptyline (PAMELOR) 50 MG capsule TAKE 1 CAPSULE(50 MG) BY MOUTH AT BEDTIME 90 capsule 0  . omeprazole (PRILOSEC) 40 MG capsule Take 1 capsule (40 mg total) by mouth daily. 30 capsule 3  . RESTASIS 0.05 % ophthalmic emulsion     . topiramate (TOPAMAX) 100 MG tablet TAKE 1 TABLET(100 MG) BY MOUTH TWICE DAILY 60 tablet 5  . topiramate (TOPAMAX) 50 MG tablet AKE 1/2 TABLET TWICE DAILY X 7 DAYS, THEN 1 TABLET TWICE DAILY X 7 DAYS THEN 1 AND 1/2 TABLET TWICE DAILY X7 DAYS THEN 2 TABLETS TWICE DAILY (Patient taking differently: Take 100 mg by mouth 2 (two) times daily. AKE 1/2 TABLET TWICE DAILY X 7 DAYS, THEN 1 TABLET TWICE DAILY X 7 DAYS THEN 1 AND 1/2 TABLET TWICE DAILY X7 DAYS THEN 2 TABLETS TWICE DAILY) 360 tablet 1  . Zoster Vac Recomb Adjuvanted [facility]) injection Inject 24mcg IM now and again in 2-6 months 0.5 mL 1   No current facility-administered medications on file prior to visit.     BP 108/78 (BP Location: Left Arm, Patient Position: Sitting, Cuff Size: Normal)   Pulse 69   Temp 98.2 F (36.8 C) (Oral)   Ht 5\' 4"  (1.626 m)   Wt 155 lb (70.3 kg)   SpO2 96%   BMI  26.61 kg/m       Objective:   Physical Exam  Constitutional: She is oriented to person, place, and time. She appears well-developed and well-nourished.  HENT:  Head: Normocephalic and atraumatic.  Cardiovascular: Normal rate, regular rhythm and normal heart sounds.   No murmur heard. Pulmonary/Chest: Effort normal and breath sounds  normal. No respiratory distress. She has no wheezes.  Musculoskeletal: She exhibits no edema.  Neurological: She is alert and oriented to person, place, and time.  Psychiatric: She has a normal mood and affect. Her behavior is normal. Judgment and thought content normal.          Assessment & Plan:  Bronchitis- will prescribe Cefdinir. A chest x-ray is performed and is negative for infiltrate. She is advised to call if new or worsening symptoms or symptoms are not resolved in 1 week.

## 2017-04-22 NOTE — Progress Notes (Signed)
Pre visit review using our clinic review tool, if applicable. No additional management support is needed unless otherwise documented below in the visit note. 

## 2017-04-22 NOTE — Patient Instructions (Signed)
Please begin cefdinir for bronchitis. Begin atorvastin (lipitor) for cholesterol. Complete chest x ray on the first floor. Call if new/worsening symptoms or if not resolved in 1 week.

## 2017-05-06 ENCOUNTER — Other Ambulatory Visit: Payer: Self-pay | Admitting: Neurology

## 2017-06-04 ENCOUNTER — Other Ambulatory Visit: Payer: Self-pay | Admitting: Neurology

## 2017-06-04 DIAGNOSIS — Z23 Encounter for immunization: Secondary | ICD-10-CM | POA: Diagnosis not present

## 2017-06-23 ENCOUNTER — Other Ambulatory Visit: Payer: Self-pay | Admitting: Neurology

## 2017-07-08 DIAGNOSIS — M25562 Pain in left knee: Secondary | ICD-10-CM | POA: Diagnosis not present

## 2017-07-08 DIAGNOSIS — M25561 Pain in right knee: Secondary | ICD-10-CM | POA: Diagnosis not present

## 2017-07-10 ENCOUNTER — Other Ambulatory Visit (HOSPITAL_COMMUNITY): Payer: Self-pay | Admitting: Orthopedic Surgery

## 2017-07-10 DIAGNOSIS — T8484XA Pain due to internal orthopedic prosthetic devices, implants and grafts, initial encounter: Secondary | ICD-10-CM

## 2017-07-10 DIAGNOSIS — Z96652 Presence of left artificial knee joint: Secondary | ICD-10-CM

## 2017-07-15 ENCOUNTER — Other Ambulatory Visit: Payer: Self-pay | Admitting: Family Medicine

## 2017-07-23 ENCOUNTER — Ambulatory Visit (INDEPENDENT_AMBULATORY_CARE_PROVIDER_SITE_OTHER): Payer: Medicare Other | Admitting: Family

## 2017-07-23 ENCOUNTER — Encounter: Payer: Self-pay | Admitting: Family

## 2017-07-23 ENCOUNTER — Telehealth: Payer: Self-pay | Admitting: Family

## 2017-07-23 ENCOUNTER — Other Ambulatory Visit: Payer: Self-pay | Admitting: Family

## 2017-07-23 VITALS — BP 134/88 | HR 67 | Temp 97.7°F | Resp 18 | Ht 64.0 in | Wt 156.4 lb

## 2017-07-23 DIAGNOSIS — M545 Low back pain, unspecified: Secondary | ICD-10-CM

## 2017-07-23 DIAGNOSIS — R3989 Other symptoms and signs involving the genitourinary system: Secondary | ICD-10-CM

## 2017-07-23 DIAGNOSIS — R350 Frequency of micturition: Secondary | ICD-10-CM

## 2017-07-23 DIAGNOSIS — F32A Depression, unspecified: Secondary | ICD-10-CM

## 2017-07-23 DIAGNOSIS — F329 Major depressive disorder, single episode, unspecified: Secondary | ICD-10-CM

## 2017-07-23 LAB — POC URINALSYSI DIPSTICK (AUTOMATED)
Bilirubin, UA: NEGATIVE
Blood, UA: NEGATIVE
Glucose, UA: NEGATIVE
Ketones, UA: NEGATIVE
Leukocytes, UA: NEGATIVE
Nitrite, UA: NEGATIVE
Protein, UA: NEGATIVE
Spec Grav, UA: 1.02 (ref 1.010–1.025)
Urobilinogen, UA: 0.2 E.U./dL
pH, UA: 6 (ref 5.0–8.0)

## 2017-07-23 MED ORDER — VENLAFAXINE HCL ER 37.5 MG PO CP24: ORAL_CAPSULE | ORAL | 0 refills | Status: DC

## 2017-07-23 NOTE — Progress Notes (Signed)
Subjective:    Patient ID: Lindsay Frost, female    DOB: 12-Feb-1956, 61 y.o.   MRN: [id number]  HPI  Pt is a 61 yr old female who presents today for follow up of her depression. Maintained on lexapro 20mg  once daily.  Not seeing a counselor because it was too expensive.  Reports that her depression symptoms are fair.  Things are "about the same."  Reports low back pain- not new.  Denies dysuria.  Denies bladder pressure.  Has urinary frequency in the evenings for several years.  Saw a urologist for this "years ago and and they had to stretch my bladder because I have a small bladder. "       Review of Systems    see HPI  Past Medical History:  Diagnosis Date  . Arthritis   . Back pain    arthritis  . Depression    takes cymbalta daily  . GERD (gastroesophageal reflux disease)    takes Omeprazole daily  . Headache(784.0)    migraines  . Heart murmur   . History of gastric ulcer   . History of migraine    last one about 2wks ago-takes Imitrex prn  . Joint pain   . Joint swelling   . Meningioma ([place])   . Ulcer      Social History   Socioeconomic History  . Marital status: Single    Spouse name: Not on file  . Number of children: Not on file  . Years of education: Not on file  . Highest education level: Not on file  Social Needs  . Financial resource strain: Not on file  . Food insecurity - worry: Not on file  . Food insecurity - inability: Not on file  . Transportation needs - medical: Not on file  . Transportation needs - non-medical: Not on file  Occupational History  . Not on file  Tobacco Use  . Smoking status: Never Smoker  . Smokeless tobacco: Never Used  Substance and Sexual Activity  . Alcohol use: No  . Drug use: No  . Sexual activity: No    Birth control/protection: Post-menopausal  Other Topics Concern  . 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 COLONOSCOPY    . DIAGNOSTIC LAPAROSCOPY  51yrs ago  . NASAL SEPTUM SURGERY  09/2016  . REPLACEMENT TOTAL KNEE Right 17yrs ago    Family History  Problem Relation Age of Onset  . Arthritis Mother   . Cancer Mother        breast  . Heart disease Mother        died from "heart problems" at age 10, had CAD  . Depression Mother   . Heart disease Father        had CABG  . Kidney disease Father   . Arthritis Sister   . Heart murmur Sister   . Arthritis Brother        "serious back/neck problems"  . Arthritis Sister   . Aneurysm Sister     Allergies  Allergen Reactions  . Naratriptan     Ineffective    Current Outpatient Medications on File Prior to Visit  Medication Sig Dispense Refill  . albuterol (PROVENTIL HFA;VENTOLIN HFA) 108 (90  Base) MCG/ACT inhaler Inhale 2 puffs into the lungs every 6 (six) hours as needed for wheezing or shortness of breath. 1 Inhaler 0  . aspirin EC 81 MG tablet Take 1 tablet (81 mg total) by mouth daily.    Marland Kitchen atorvastatin (LIPITOR) 20 MG tablet Take 1 tablet (20 mg total) by mouth daily. 30 tablet 6  . cefdinir (OMNICEF) 300 MG capsule Take 1 capsule (300 mg total) by mouth 2 (two) times daily. 14 capsule 0  . eletriptan (RELPAX) 40 MG tablet TAKE 1 TABLET BY MOUTH AS NEEDED FOR MIGRAINE OF HEADACHE. MAY REPEAT IN 2 HOURS IF HEADACHE PERSISTS OR RECURS 10 tablet 0  . eletriptan (RELPAX) 40 MG tablet TAKE 1 TABLET BY MOUTH AS NEEDED FOR MIGRAINE OF HEADACHE. MAY REPEAT IN 2 HOURS IF HEADACHE PERSISTS OR RECURS 10 tablet 0  . eletriptan (RELPAX) 40 MG tablet TAKE 1 TABLET BY MOUTH AS NEEDED FOR MIGRAINE OF HEADACHE. MAY REPEAT IN 2 HOURS IF HEADACHE PERSISTS OR RECURS 10 tablet 2  . escitalopram (LEXAPRO) 20 MG tablet TAKE 1 TABLET(20 MG) BY MOUTH DAILY 90 tablet 0  . fluorometholone (FML) 0.1 % ophthalmic suspension Place 1 drop into  both eyes as needed.  1  . fluticasone (FLONASE) 50 MCG/ACT nasal spray SHAKE LIQUID AND USE 2 SPRAYS IN EACH NOSTRIL DAILY 48 g 0  . HYDROcodone-acetaminophen (NORCO/VICODIN) 5-325 MG per tablet 1 tablet. PRN  0  . ibuprofen (ADVIL,MOTRIN) 200 MG tablet Take 800 mg by mouth every 6 (six) hours as needed (Pain).    Marland Kitchen omeprazole (PRILOSEC) 40 MG capsule Take 1 capsule (40 mg total) by mouth daily. 30 capsule 3  . RESTASIS 0.05 % ophthalmic emulsion     . topiramate (TOPAMAX) 100 MG tablet TAKE 1 TABLET(100 MG) BY MOUTH TWICE DAILY 180 tablet 5  . topiramate (TOPAMAX) 50 MG tablet AKE 1/2 TABLET TWICE DAILY X 7 DAYS, THEN 1 TABLET TWICE DAILY X 7 DAYS THEN 1 AND 1/2 TABLET TWICE DAILY X7 DAYS THEN 2 TABLETS TWICE DAILY (Patient taking differently: Take 100 mg by mouth 2 (two) times daily. AKE 1/2 TABLET TWICE DAILY X 7 DAYS, THEN 1 TABLET TWICE DAILY X 7 DAYS THEN 1 AND 1/2 TABLET TWICE DAILY X7 DAYS THEN 2 TABLETS TWICE DAILY) 360 tablet 1   No current facility-administered medications on file prior to visit.     BP 134/88 (BP Location: Right Arm, Cuff Size: Normal)   Pulse 67   Temp 97.7 F (36.5 C) (Oral)   Resp 18   Ht 5\' 4"  (1.626 m)   Wt 156 lb 6.4 oz (70.9 kg)   SpO2 100%   BMI 26.85 kg/m    Objective:   Physical Exam  Constitutional: She is oriented to person, place, and time. She appears well-developed and well-nourished.  Cardiovascular: Normal rate, regular rhythm and normal heart sounds.  No murmur heard. Pulmonary/Chest: Effort normal and breath sounds normal. No respiratory distress. She has no wheezes.  Abdominal: Soft. Bowel sounds are normal. She exhibits no distension. There is no tenderness. There is no rebound, no guarding and no CVA tenderness.  Neurological: She is alert and oriented to person, place, and time.  Psychiatric: She has a normal mood and affect. Her behavior is normal. Judgment and thought content normal.          Assessment & Plan:    Depression- Scored 12 on PHQ-9. Maintained on lexapro.  Will add trial of effexor.  Urinary frequency- mainly  at night. UA unremarkable. Will send for culture.  If neg culture, consider rx for OAB.

## 2017-07-23 NOTE — Telephone Encounter (Signed)
Opened in erro

## 2017-07-23 NOTE — Patient Instructions (Signed)
Please begin effexor one tab once daily for 1 week, then increase to 2 tabs once daily on week two.

## 2017-07-24 LAB — URINE CULTURE
MICRO NUMBER:: 81252549
SPECIMEN QUALITY:: ADEQUATE

## 2017-07-27 ENCOUNTER — Telehealth: Payer: Self-pay | Admitting: Family

## 2017-07-27 MED ORDER — MIRABEGRON ER 25 MG PO TB24: 25.0000 mg | ORAL_TABLET | Freq: Every day | ORAL | 3 refills | Status: DC

## 2017-07-27 NOTE — Telephone Encounter (Signed)
Please let pt know urine culture is negative.  Let's have her try myrbetriq to see if this helps with her urinary symptoms.

## 2017-07-29 NOTE — Telephone Encounter (Signed)
Notified pt and she is agreeable to proceed with medication.

## 2017-08-22 ENCOUNTER — Ambulatory Visit: Payer: Medicare Other | Admitting: Family

## 2017-08-28 ENCOUNTER — Other Ambulatory Visit: Payer: Self-pay | Admitting: Neurology

## 2017-09-18 ENCOUNTER — Encounter: Payer: Self-pay | Admitting: Neurology

## 2017-09-18 ENCOUNTER — Ambulatory Visit (INDEPENDENT_AMBULATORY_CARE_PROVIDER_SITE_OTHER): Payer: Medicare Other | Admitting: Neurology

## 2017-09-18 VITALS — BP 132/100 | HR 66 | Ht 64.0 in | Wt 154.2 lb

## 2017-09-18 DIAGNOSIS — R400 Somnolence: Secondary | ICD-10-CM | POA: Diagnosis not present

## 2017-09-18 DIAGNOSIS — G40209 Localization-related (focal) (partial) symptomatic epilepsy and epileptic syndromes with complex partial seizures, not intractable, without status epilepticus: Secondary | ICD-10-CM

## 2017-09-18 DIAGNOSIS — G43009 Migraine without aura, not intractable, without status migrainosus: Secondary | ICD-10-CM

## 2017-09-18 NOTE — Patient Instructions (Signed)
1.  Continue topiramate 100mg  twice daily 2.  We will get a sleep study to check you for sleep apnea, which may contribute to morning headaches and daytime fatigue. 3.  Limit use of pain relievers to no more than 2 days out of the week.  These medications include acetaminophen, ibuprofen, triptans and narcotics.  This will help reduce risk of rebound headaches. 4.  Be aware of common food triggers such as processed sweets, processed foods with nitrites (such as deli meat, hot dogs, sausages), foods with MSG, alcohol (such as wine), chocolate, certain cheeses, certain fruits (dried fruits, bananas, some citrus fruit), vinegar, diet soda. 4.  Avoid caffeine 5.  Routine exercise 6.  Proper sleep hygiene 7.  Stay adequately hydrated with water 8.  Keep a headache diary. 9.  Maintain proper stress management. 10.  Do not skip meals. 11.  Consider supplements:  Magnesium citrate 400mg  to 600mg  daily, riboflavin 400mg , Coenzyme Q 10 100mg  three times daily 12.  Follow up in 5 months.

## 2017-09-18 NOTE — Progress Notes (Signed)
NEUROLOGY FOLLOW UP OFFICE NOTE  Lindsay Frost [id number]  HISTORY OF PRESENT ILLNESS: Lindsay Frost is a 62 year old right-handed woman with history of depression, migraine, and GERD who follows up for migraine without aura and possible history of seizures.  She is accompanied by her sister who supplements history.   UPDATE: Migraine: Over the past couple of months, she has had increased headaches associated with change in weather (storms).  She tends to wake up with the headache now. Moderate intensity. Lasts 2 hours with Relpax, ibuprofen or BC Occurs 3 to 4 days a week. Current abortive therapy: Relpax Current preventative:  topiramate 100mg  twice daily  Exercise:  No Diet:  Does not hydrate.  Eats processed foods Sleep.  Sleeps well but is fatigued during the day.   Seizures: No seizures.  Last spell occurred 05/27/15.   Current preventative: topiramate 100mg  twice daily  HISTORY: Migraine: Onset:  Early 40s Location:  Left frontal Quality:  Stabbing Initial Intensity:  10 out of 10; September 7/10 Aura:  No Prodrome:  Photophobia Associated symptoms:  Nausea, phonophobia, photophobia, sometimes vomiting. No osmophobia, visual disturbance, or autonomic symptoms. Initial Duration:  2-3 days; September 3 hours with Relpax Initial Frequency:  Migraines occur every 2 weeks (4-6 days per month) and dull headaches occur 2 times a week (8 days per month). Total of 14 headache days per month; September 3 times a month Triggers/exacerbating factors:  Heat Relieving factors:  Laying down in a quiet, dark, cool room with ice pack. Activity:  Cannot function when she has migraine   Past abortive therapy:  sumatriptan 50mg  (ineffective, caused racing heart), Maxalt (ineffective), BC powder Past preventative therapy:  Topamax (side effects, ineffective), Cymbalta (for depression), nortriptyline 50mg  (discontinued in order to simplify medication list) Unable to start Inderal LA  because insurance wouldn't cover it.   Caffeine:  Coffee, tea, soda Alcohol:  no Smoker:  no Diet:  Junk food Exercise:  Walks dog Depression/stress:  controlled Sleep hygiene:  Good.  Sleeps 10 hours, which she feels is too much (particularly if she has a migraine) Family history of headache:  daughter   Seizure: Her Wellbutrin was increased in August 2016 to help control her depression.    At around that time, she had an episode where she woke up and found herself on the bathroom floor.  She didn't remember falling.  Later in the month, she had another episode where she woke up on the floor next to her bed and noted that she was briefly kicking both legs.  She had a severe rug burn on the left side of her face.  She did not bite her tongue during either event but she did have some urinary incontinence during the episode in the bathroom.  Her PCP, Dr. Birdie Frost, subsequently decreased her dose of Wellbutrin.  She has no prior history of seizures.  She had a sleep deprived EEG performed on 06/07/14, which showed brief intermittent transient sharp waves and slowing in the left temporal region, which although not epileptiform, could be a seizure focus.   On 05/27/15, she had a different spell.  It was unwitnessed.  When she woke up, she was short of breath and it took a few minutes to recover.  Sometimes, she will wake up short of breath, but this episode was worse.  She did not have incontinence or tongue biting.     MRI of brain with and without contrast from 08/19/13 demonstrated a 12 mm left parietal  calcified meningioma without surrounding vasogenic edema, minimally increased compared to prior study from 10/2003. MRI of the brain with and without contrast was performed on 06/08/14, which demonstrated stability of the meningioma.  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  Past antiepileptic medication:  Keppra  PAST MEDICAL HISTORY: Past Medical History:  Diagnosis Date  . Arthritis   . Back pain    arthritis  . Depression    takes cymbalta daily  . GERD (gastroesophageal reflux disease)    takes Omeprazole daily  . Headache(784.0)    migraines  . Heart murmur   . History of gastric ulcer   . History of migraine    last one about 2wks ago-takes Imitrex prn  . Joint pain   . Joint swelling   . Meningioma ([place])   . Ulcer     MEDICATIONS: Current Outpatient Medications on File Prior to Visit  Medication Sig Dispense Refill  . albuterol (PROVENTIL HFA;VENTOLIN HFA) 108 (90 Base) MCG/ACT inhaler Inhale 2 puffs into the lungs every 6 (six) hours as needed for wheezing or shortness of breath. 1 Inhaler 0  . aspirin EC 81 MG tablet Take 1 tablet (81 mg total) by mouth daily.    Marland Kitchen atorvastatin (LIPITOR) 20 MG tablet Take 1 tablet (20 mg total) by mouth daily. 30 tablet 6  . cefdinir (OMNICEF) 300 MG capsule Take 1 capsule (300 mg total) by mouth 2 (two) times daily. 14 capsule 0  . eletriptan (RELPAX) 40 MG tablet TAKE 1 TABLET BY MOUTH AS NEEDED FOR MIGRAINE OF HEADACHE. MAY REPEAT IN 2 HOURS IF HEADACHE PERSISTS OR RECURS 10 tablet 0  . eletriptan (RELPAX) 40 MG tablet TAKE 1 TABLET BY MOUTH AS NEEDED FOR MIGRAINE OF HEADACHE. MAY REPEAT IN 2 HOURS IF HEADACHE PERSISTS OR RECURS 10 tablet 0  . eletriptan (RELPAX) 40 MG tablet TAKE 1 TABLET BY MOUTH AS NEEDED FOR MIGRAINE OF HEADACHE. MAY REPEAT IN 2 HOURS IF HEADACHE PERSISTS OR RECURS 10 tablet 2  . eletriptan (RELPAX) 40 MG tablet TAKE 1 TABLET BY MOUTH AS NEEDED FOR MIGRAINE OF HEADACHE. MAY REPEAT IN 2 HOURS IF HEADACHE PERSISTS OR RECURS 10 tablet 0  . escitalopram (LEXAPRO) 20 MG tablet TAKE 1 TABLET(20 MG) BY MOUTH DAILY 90 tablet 0  . fluorometholone (FML) 0.1 % ophthalmic suspension Place 1 drop into both eyes as needed.  1  . fluticasone (FLONASE) 50 MCG/ACT nasal spray SHAKE  LIQUID AND USE 2 SPRAYS IN EACH NOSTRIL DAILY 48 g 0  . HYDROcodone-acetaminophen (NORCO/VICODIN) 5-325 MG per tablet 1 tablet. PRN  0  . ibuprofen (ADVIL,MOTRIN) 200 MG tablet Take 800 mg by mouth every 6 (six) hours as needed (Pain).    . mirabegron ER (MYRBETRIQ) 25 MG TB24 tablet Take 1 tablet (25 mg total) daily by mouth. 30 tablet 3  . omeprazole (PRILOSEC) 40 MG capsule Take 1 capsule (40 mg total) by mouth daily. 30 capsule 3  . RESTASIS 0.05 % ophthalmic emulsion     . topiramate (TOPAMAX) 100 MG tablet TAKE 1 TABLET(100 MG) BY MOUTH TWICE DAILY 180 tablet 5  . topiramate (TOPAMAX) 50 MG tablet AKE 1/2 TABLET TWICE DAILY X 7 DAYS, THEN 1 TABLET TWICE DAILY X 7 DAYS THEN 1 AND 1/2 TABLET TWICE DAILY X7 DAYS THEN 2 TABLETS TWICE DAILY (Patient taking differently: Take 100 mg by mouth  2 (two) times daily. AKE 1/2 TABLET TWICE DAILY X 7 DAYS, THEN 1 TABLET TWICE DAILY X 7 DAYS THEN 1 AND 1/2 TABLET TWICE DAILY X7 DAYS THEN 2 TABLETS TWICE DAILY) 360 tablet 1  . venlafaxine XR (EFFEXOR XR) 37.5 MG 24 hr capsule 1 tab by mouth once daily for 1 week, then increase to two tabs once daily. 60 capsule 0   No current facility-administered medications on file prior to visit.     ALLERGIES: Allergies  Allergen Reactions  . Naratriptan     Ineffective    FAMILY HISTORY: Family History  Problem Relation Age of Onset  . Arthritis Mother   . Cancer Mother        breast  . Heart disease Mother        died from "heart problems" at age 65, had CAD  . Depression Mother   . Heart disease Father        had CABG  . Kidney disease Father   . Arthritis Sister   . Heart murmur Sister   . Arthritis Brother        "serious back/neck problems"  . Arthritis Sister   . Aneurysm Sister     SOCIAL HISTORY: Social History   Socioeconomic History  . Marital status: Single    Spouse name: Not on file  . Number of children: Not on file  . Years of education: Not on file  . Highest education level:  Not on file  Social Needs  . Financial resource strain: Not on file  . Food insecurity - worry: Not on file  . Food insecurity - inability: Not on file  . Transportation needs - medical: Not on file  . Transportation needs - non-medical: Not on file  Occupational History  . Not on file  Tobacco Use  . Smoking status: Never Smoker  . Smokeless tobacco: Never Used  Substance and Sexual Activity  . Alcohol use: No  . Drug use: No  . Sexual activity: No    Birth control/protection: Post-menopausal  Other Topics Concern  . 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, mining   Single   1 daughter- lives in [place]   2 dogs   Enjoys reading, watching television    REVIEW OF SYSTEMS: Constitutional: No fevers, chills, or sweats, no generalized fatigue, change in appetite Eyes: No visual changes, double vision, eye pain Ear, nose and throat: No hearing loss, ear pain, nasal congestion, sore throat Cardiovascular: No chest pain, palpitations Respiratory:  No shortness of breath at rest or with exertion, wheezes GastrointestinaI: No nausea, vomiting, diarrhea, abdominal pain, fecal incontinence Genitourinary:  No dysuria, urinary retention or frequency Musculoskeletal:  No neck pain, back pain Integumentary: No rash, pruritus, skin lesions Neurological: as above Psychiatric: No depression, insomnia, anxiety Endocrine: No palpitations, fatigue, diaphoresis, mood swings, change in appetite, change in weight, increased thirst Hematologic/Lymphatic:  No purpura, petechiae. Allergic/Immunologic: no itchy/runny eyes, nasal congestion, recent allergic reactions, rashes  PHYSICAL EXAM: Vitals:   09/18/17 1127  BP: (!) 132/100  Pulse: 66  SpO2: 98%   General: No acute distress.  Patient appears well-groomed.  normal body habitus. Head:  Normocephalic/atraumatic Eyes:  Fundi examined but not visualized Neck: supple, no paraspinal  tenderness, full range of motion Heart:  Regular rate and rhythm Lungs:  Clear to auscultation bilaterally Back: No paraspinal tenderness Neurological Exam: alert and oriented to person, place, and time. Attention  span and concentration intact, recent and remote memory intact, fund of knowledge intact.  Speech fluent and not dysarthric, language intact.  CN II-XII intact. Bulk and tone normal, muscle strength 5/5 throughout.  Sensation to light touch  intact.  Deep tendon reflexes 2+ throughout.  Finger to nose testing intact.  Gait normal, Romberg negative.  IMPRESSION: 1.  Migraines, increased frequency likely due to weather.  With morning headaches and daytime fatigue, consider sleep apnea. 2.  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  We will order sleep study to evaluate for sleep apnea 2.  Continue topiramate 100mg  twice daily 3.  Limit pain relievers to no more than 2 days a week to prevent rebound headache. 4.  Lifestyle modification:  Exercise, hydration, diet 5.  Recommend supplements/vitamins:  Mg, B2, CoQ10 6.  Follow up with PCP regarding blood pressure. 7.  Follow up in 5 months.  Metta Clines, DO  CC:  Debbrah Alar, NP

## 2017-09-21 IMAGING — DX DG CHEST 2V
2 series · 2 of 2 positions shown · non-contrast
Comparison: 11/24/2012 .

CLINICAL DATA: Shortness of breath.

EXAM:
CHEST  2 VIEW

[chest pa]
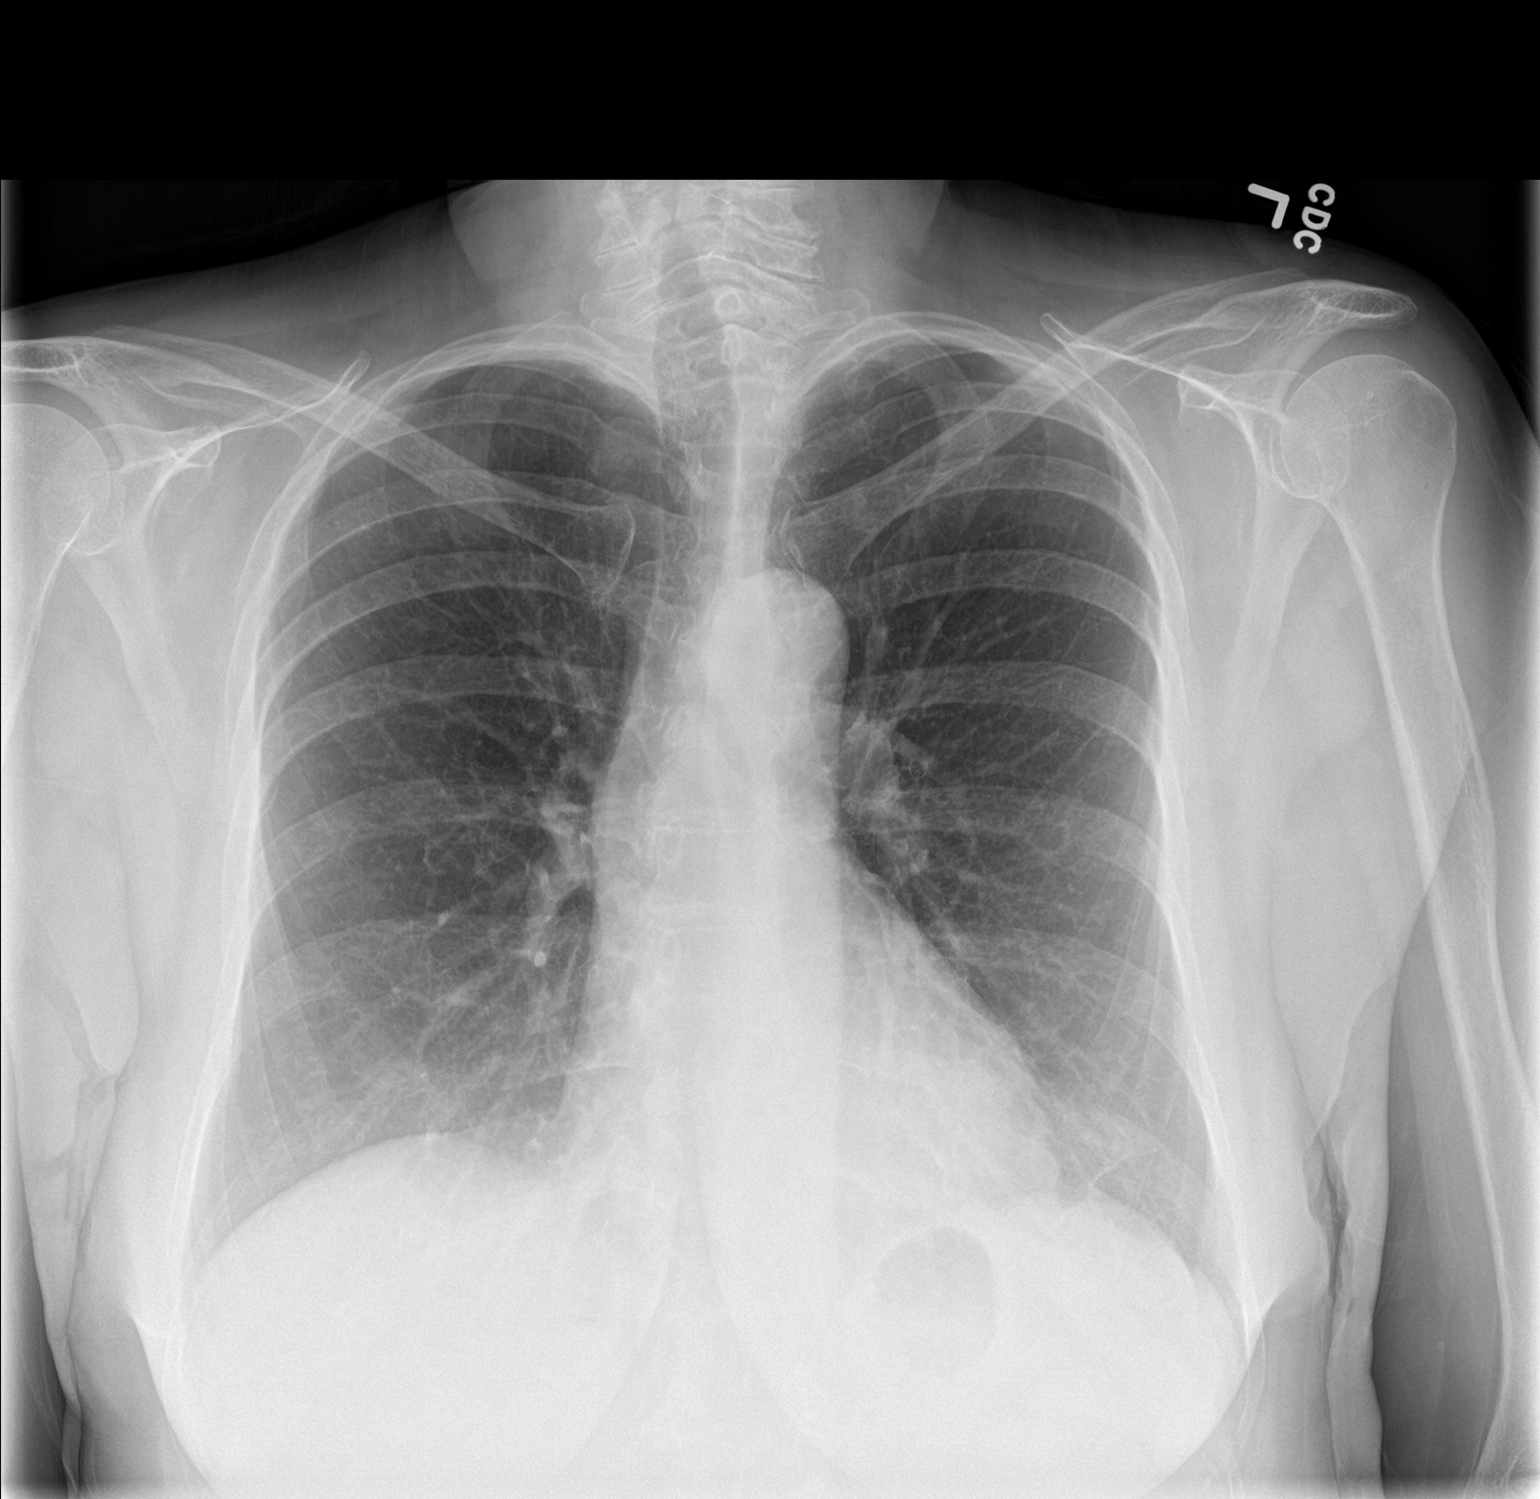

[chest lat]
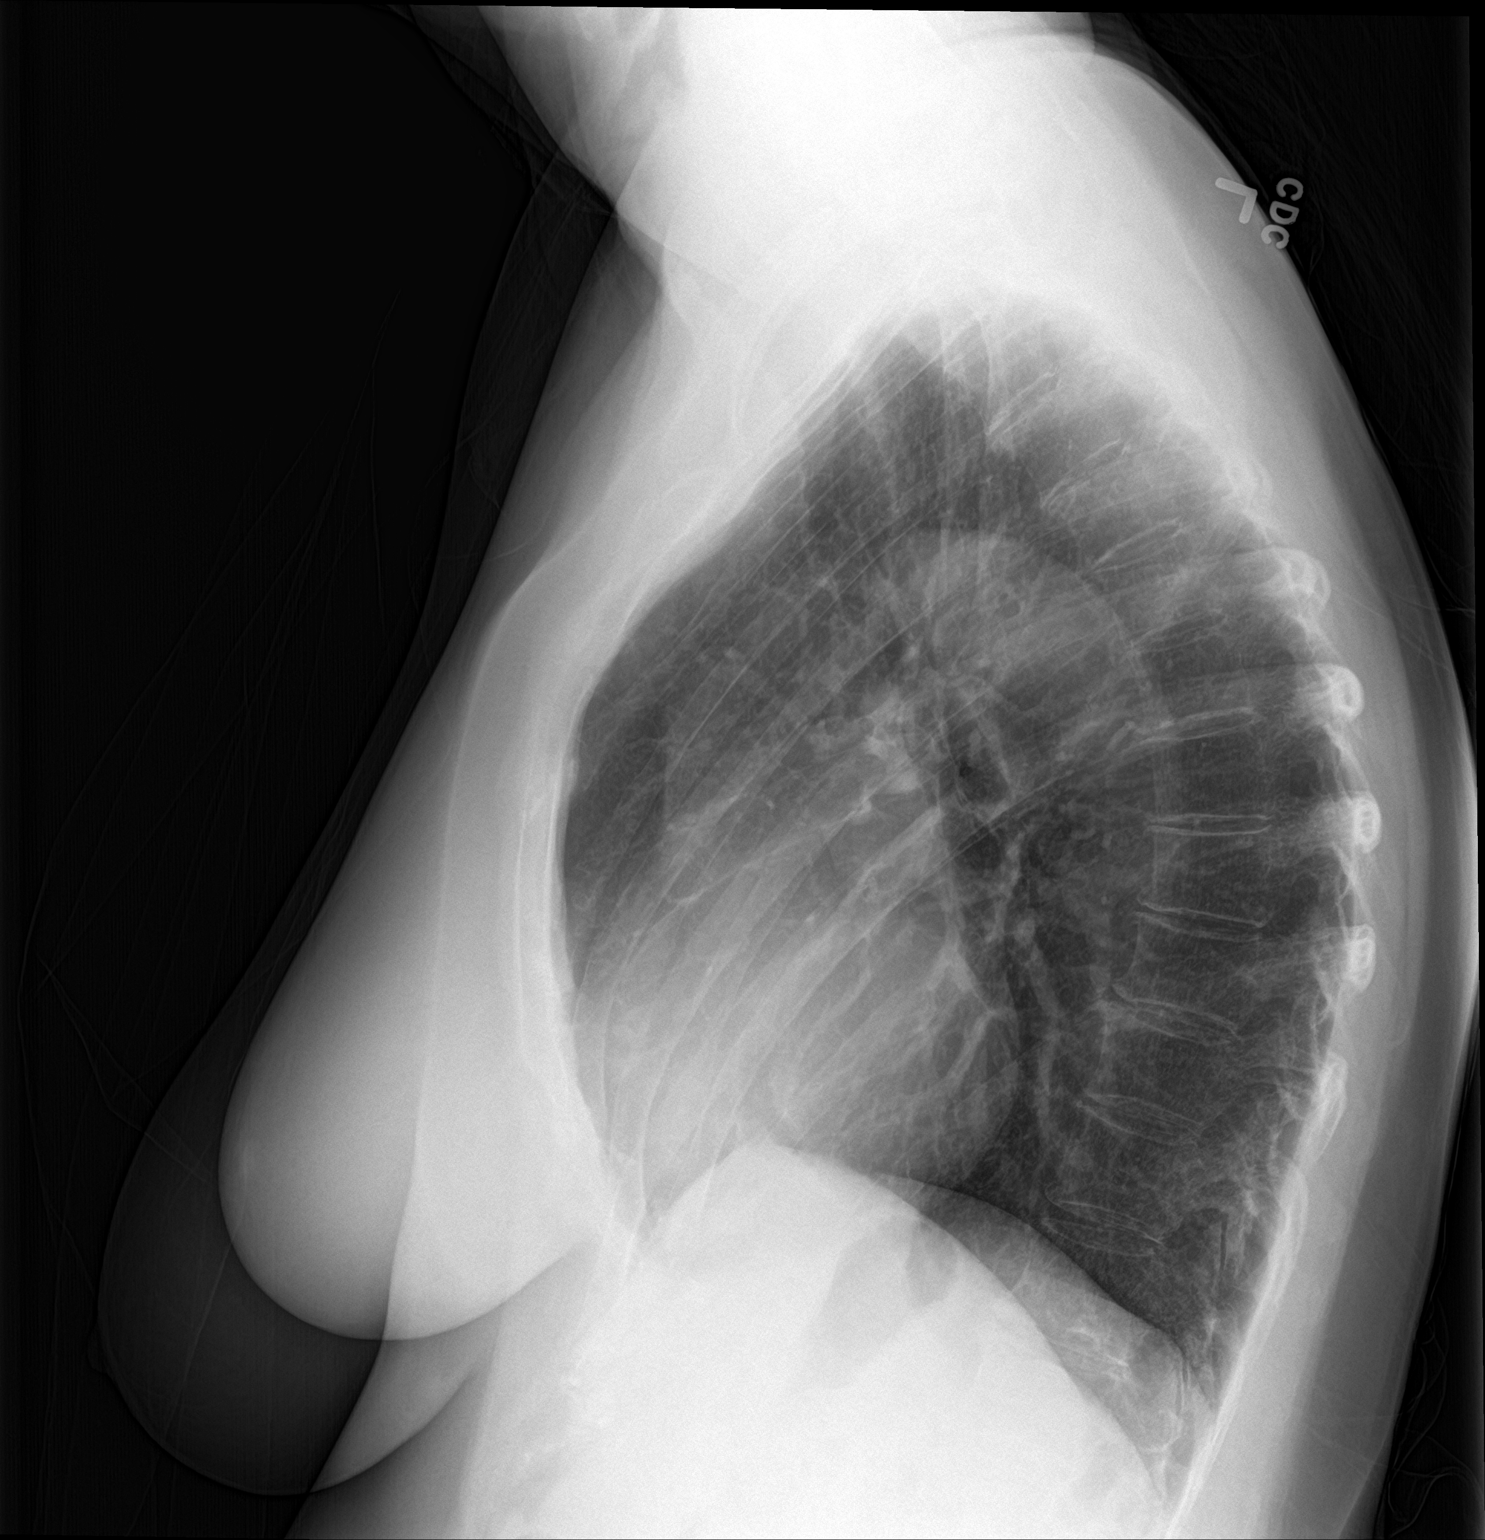

[2 of 2 positions shown; findings below may reference images not displayed]

FINDINGS: Mediastinum hilar structures are normal. Mild left base subsegmental
atelectasis and/or scarring. No pleural effusion pneumothorax. Heart
size normal. No acute bony abnormality .
IMPRESSION: Mild left base subsegmental atelectasis and/or scarring. Exam
otherwise unremarkable.

## 2017-09-30 ENCOUNTER — Ambulatory Visit (HOSPITAL_BASED_OUTPATIENT_CLINIC_OR_DEPARTMENT_OTHER)
Admission: RE | Admit: 2017-09-30 | Discharge: 2017-09-30 | Disposition: A | Discharge status: Home / Self Care | Payer: Medicare Other | Source: Ambulatory Visit | Attending: Family | Admitting: Family

## 2017-09-30 ENCOUNTER — Ambulatory Visit (INDEPENDENT_AMBULATORY_CARE_PROVIDER_SITE_OTHER): Payer: Medicare Other | Admitting: Family

## 2017-09-30 ENCOUNTER — Other Ambulatory Visit: Payer: Self-pay | Admitting: Family

## 2017-09-30 ENCOUNTER — Encounter: Payer: Self-pay | Admitting: Family

## 2017-09-30 VITALS — BP 128/82 | HR 71 | Temp 97.7°F | Resp 16 | Ht 64.0 in | Wt 156.4 lb

## 2017-09-30 DIAGNOSIS — F32A Depression, unspecified: Secondary | ICD-10-CM

## 2017-09-30 DIAGNOSIS — M545 Low back pain: Secondary | ICD-10-CM

## 2017-09-30 DIAGNOSIS — R350 Frequency of micturition: Secondary | ICD-10-CM | POA: Diagnosis not present

## 2017-09-30 DIAGNOSIS — F329 Major depressive disorder, single episode, unspecified: Secondary | ICD-10-CM

## 2017-09-30 LAB — POC URINALSYSI DIPSTICK (AUTOMATED)
Blood, UA: NEGATIVE
Glucose, UA: NEGATIVE
Ketones, UA: NEGATIVE
Leukocytes, UA: NEGATIVE
Nitrite, UA: NEGATIVE
Protein, UA: NEGATIVE
Spec Grav, UA: >=1.03 — AB (ref 1.010–1.025)
Urobilinogen, UA: 0.2 E.U./dL
pH, UA: 6 (ref 5.0–8.0)

## 2017-09-30 MED ORDER — MELOXICAM 7.5 MG PO TABS: 7.5000 mg | ORAL_TABLET | Freq: Every day | ORAL | 0 refills | Status: DC

## 2017-09-30 NOTE — Telephone Encounter (Signed)
LV: 09/30/2017 NV: No upcoming appt LR:07/15/2017

## 2017-09-30 NOTE — Patient Instructions (Addendum)
Please begin meloxicam once daily for back pain. You may use tylenol or tramadol as needed as well but don't take ibuprofen while you are taking meloxicam Complete x ray on the first floor. Call if pain worsens or if not improved in 2 weeks.

## 2017-09-30 NOTE — Progress Notes (Signed)
Continues lexapro, had nausea on effexor. Not interested in counseling. Reports depression is "about the same." Denies current SI. Had a bad day "about a week ago when I stayed in bed all day because I just didn't see the point."  Reports some low back pain 1 month. Using heating pad x 1 month. She has used the tramadol, ibuprofen prn. No improvement in the pain. Denies dysuria.   Review of Systems  See HPI      Past Medical History:  Diagnosis Date  . Arthritis   . Back pain    arthritis  . Depression    takes cymbalta daily  . GERD (gastroesophageal reflux disease)    takes Omeprazole daily  . Headache(784.0)    migraines  . Heart murmur   . History of gastric ulcer   . History of migraine    last one about 2wks ago-takes Imitrex prn  . Joint pain   . Joint swelling   . Meningioma ([place])   . Ulcer    Social History        Socioeconomic History  . Marital status: Single    Spouse name: Not on file  . Number of children: Not on file  . Years of education: Not on file  . Highest education level: Not on file  Social Needs  . Financial resource strain: Not on file  . Food insecurity - worry: Not on file  . Food insecurity - inability: Not on file  . Transportation needs - medical: Not on file  . Transportation needs - non-medical: Not on file  Occupational History  . Not on file  Tobacco Use  . Smoking status: Never Smoker  . Smokeless tobacco: Never Used  Substance and Sexual Activity  . Alcohol use: No  . Drug use: No  . Sexual activity: No    Birth control/protection: Post-menopausal  Other Topics Concern  . 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 BALLOON DILATION  04/03/2012   Procedure: BALLOON DILATION; Surgeon: Beryle Beams, MD; Location:  [facility]; Service: Endoscopy; Laterality: N/A;  . COLONOSCOPY    . DIAGNOSTIC LAPAROSCOPY  53yrs ago  . ESOPHAGEAL MANOMETRY N/A 09/04/2015   Procedure: ESOPHAGEAL MANOMETRY (EM); Surgeon: Carol Ada, MD; Location: [facility]; Service: Endoscopy; Laterality: N/A;  . ESOPHAGOGASTRODUODENOSCOPY  04/03/2012   Procedure: ESOPHAGOGASTRODUODENOSCOPY (EGD); Surgeon: Beryle Beams, MD; Location: [facility]; Service: Endoscopy; Laterality: N/A; EGD w/ balloon dilation  . ESOPHAGOGASTRODUODENOSCOPY (EGD) WITH PROPOFOL N/A 02/18/2014   Procedure: ESOPHAGOGASTRODUODENOSCOPY (EGD) WITH PROPOFOL; Surgeon: Beryle Beams, MD; Location: [facility]; Service: Endoscopy; Laterality: N/A;  . ESOPHAGOGASTRODUODENOSCOPY (EGD) WITH PROPOFOL N/A 04/22/2014   Procedure: ESOPHAGOGASTRODUODENOSCOPY (EGD) WITH PROPOFOL; Surgeon: Beryle Beams, MD; Location: [facility]; Service: Endoscopy; Laterality: N/A;  . NASAL SEPTUM SURGERY  09/2016  . [place] IMPEDANCE STUDY N/A 09/04/2015   Procedure: [place] IMPEDANCE STUDY; Surgeon: Carol Ada, MD; Location: [facility]; Service: Endoscopy; Laterality: N/A;  . REPLACEMENT TOTAL KNEE Right 4yrs ago  . TOTAL KNEE ARTHROPLASTY Left 11/30/2012   Procedure: LEFT TOTAL KNEE ARTHROPLASTY; Surgeon: Kerin Salen, MD; Location: [facility]; Service: Orthopedics; Laterality: Left;        Family History  Problem Relation Age of Onset  .  Arthritis Mother   . Cancer Mother    breast  . Heart disease Mother    died from "heart problems" at age 47, had CAD  . Depression Mother   . Heart disease Father    had CABG  . Kidney disease Father   . Arthritis Sister   . Heart murmur Sister   . Arthritis Brother    "serious back/neck problems"  . Arthritis Sister   . Aneurysm Sister         Allergies  Allergen Reactions  . Naratriptan     Ineffective         Current Outpatient Medications on File Prior to Visit  Medication Sig Dispense Refill  . albuterol (PROVENTIL HFA;VENTOLIN HFA)  108 (90 Base) MCG/ACT inhaler Inhale 2 puffs into the lungs every 6 (six) hours as needed for wheezing or shortness of breath. 1 Inhaler 0  . aspirin EC 81 MG tablet Take 1 tablet (81 mg total) by mouth daily.    Marland Kitchen atorvastatin (LIPITOR) 20 MG tablet Take 1 tablet (20 mg total) by mouth daily. 30 tablet 6  . cefdinir (OMNICEF) 300 MG capsule Take 1 capsule (300 mg total) by mouth 2 (two) times daily. 14 capsule 0  . eletriptan (RELPAX) 40 MG tablet TAKE 1 TABLET BY MOUTH AS NEEDED FOR MIGRAINE OF HEADACHE. MAY REPEAT IN 2 HOURS IF HEADACHE PERSISTS OR RECURS 10 tablet 0  . escitalopram (LEXAPRO) 20 MG tablet TAKE 1 TABLET(20 MG) BY MOUTH DAILY 90 tablet 0  . fluorometholone (FML) 0.1 % ophthalmic suspension Place 1 drop into both eyes as needed.  1  . fluticasone (FLONASE) 50 MCG/ACT nasal spray SHAKE LIQUID AND USE 2 SPRAYS IN EACH NOSTRIL DAILY 48 g 0  . HYDROcodone-acetaminophen (NORCO/VICODIN) 5-325 MG per tablet 1 tablet. PRN  0  . ibuprofen (ADVIL,MOTRIN) 200 MG tablet Take 800 mg by mouth every 6 (six) hours as needed (Pain).    . mirabegron ER (MYRBETRIQ) 25 MG TB24 tablet Take 1 tablet (25 mg total) daily by mouth. 30 tablet 3  . omeprazole (PRILOSEC) 40 MG capsule Take 1 capsule (40 mg total) by mouth daily. 30 capsule 3  . RESTASIS 0.05 % ophthalmic emulsion     . topiramate (TOPAMAX) 100 MG tablet TAKE 1 TABLET(100 MG) BY MOUTH TWICE DAILY 180 tablet 5  . topiramate (TOPAMAX) 50 MG tablet AKE 1/2 TABLET TWICE DAILY X 7 DAYS, THEN 1 TABLET TWICE DAILY X 7 DAYS THEN 1 AND 1/2 TABLET TWICE DAILY X7 DAYS THEN 2 TABLETS TWICE DAILY (Patient taking differently: Take 100 mg by mouth 2 (two) times daily. AKE 1/2 TABLET TWICE DAILY X 7 DAYS, THEN 1 TABLET TWICE DAILY X 7 DAYS THEN 1 AND 1/2 TABLET TWICE DAILY X7 DAYS THEN 2 TABLETS TWICE DAILY) 360 tablet 1  . venlafaxine XR (EFFEXOR XR) 37.5 MG 24 hr capsule 1 tab by mouth once daily for 1 week, then increase to two tabs once daily. 60 capsule 0   . traMADol (ULTRAM) 50 MG tablet Take 50 mg by mouth as needed.  3   No current facility-administered medications on file prior to visit.    BP 128/82  Pulse 71  Temp 97.7 F (36.5 C) (Oral)  Resp 16  Ht 5\' 4"  (1.626 m)  Wt 156 lb 6.4 oz (70.9 kg)  SpO2 100%  BMI 26.85 kg/m   Objective:  Physical Exam  Constitutional: She appears well-developed and well-nourished.  Cardiovascular: Normal rate, regular rhythm and  normal heart sounds.  No murmur heard.  Pulmonary/Chest: Effort normal and breath sounds normal. No respiratory distress. She has no wheezes.  Psychiatric: She has a normal mood and affect. Her behavior is normal. Judgment and thought content normal.   Assessment & Plan:   Depression- fair, does not want to change/add medications or counseling.  Continue lexapro.   Back pain- will obtain xray to further assess.  Advised pt as follows:   You may use tylenol or tramadol as needed as well but don't take ibuprofen while you are taking meloxicam.  UA negative.

## 2017-10-02 ENCOUNTER — Encounter: Payer: Self-pay | Admitting: Family

## 2017-10-14 ENCOUNTER — Other Ambulatory Visit: Payer: Self-pay | Admitting: Family

## 2017-11-14 ENCOUNTER — Other Ambulatory Visit: Payer: Self-pay | Admitting: Family

## 2017-12-22 ENCOUNTER — Encounter: Payer: Self-pay | Admitting: Family

## 2017-12-22 ENCOUNTER — Ambulatory Visit (INDEPENDENT_AMBULATORY_CARE_PROVIDER_SITE_OTHER): Payer: Medicare Other | Admitting: Family

## 2017-12-22 VITALS — BP 125/65 | HR 68 | Temp 98.4°F | Resp 16 | Ht 64.0 in | Wt 151.4 lb

## 2017-12-22 DIAGNOSIS — J329 Chronic sinusitis, unspecified: Secondary | ICD-10-CM | POA: Diagnosis not present

## 2017-12-22 MED ORDER — AMOXICILLIN-POT CLAVULANATE 875-125 MG PO TABS: 1.0000 | ORAL_TABLET | Freq: Two times a day (BID) | ORAL | 0 refills | Status: DC

## 2017-12-22 NOTE — Progress Notes (Signed)
Subjective:    Patient ID: Lindsay Frost, female    DOB: 06-Feb-1956, 62 y.o.   MRN: [id number]  HPI  Patient is a 62 yr old female who presents today with chief complaint of facial pain.  She reports sinus HA and blood discharge, nasal congestion and cheek pressure. Reports that symptoms began 1 week ago. She has tried otc sudafed, BC powders and cough syrup with very little improvement in her symptoms. She denies fever.    Review of Systems See HPI  Past Medical History:  Diagnosis Date  . Arthritis   . Back pain    arthritis  . Depression    takes cymbalta daily  . GERD (gastroesophageal reflux disease)    takes Omeprazole daily  . Headache(784.0)    migraines  . Heart murmur   . History of gastric ulcer   . History of migraine    last one about 2wks ago-takes Imitrex prn  . Joint pain   . Joint swelling   . Meningioma ([place])   . Ulcer      Social History   Socioeconomic History  . Marital status: Single    Spouse name: Not on file  . Number of children: Not on file  . Years of education: Not on file  . Highest education level: Not on file  Occupational History  . Not on file  Social Needs  . Financial resource strain: Not on file  . Food insecurity:    Worry: Not on file    Inability: Not on file  . Transportation needs:    Medical: Not on file    Non-medical: Not on file  Tobacco Use  . Smoking status: Never Smoker  . Smokeless tobacco: Never Used  Substance and Sexual Activity  . Alcohol use: No  . Drug use: No  . Sexual activity: Never    Birth control/protection: Post-menopausal  Lifestyle  . Physical activity:    Days per week: Not on file    Minutes per session: Not on file  . Stress: Not on file  Relationships  . Social connections:    Talks on phone: Not on file    Gets together: Not on file    Attends religious service: Not on file    Active member of club or organization: Not on file    Attends meetings of clubs or organizations:  Not on file    Relationship status: Not on file  . Intimate partner violence:    Fear of current or ex partner: Not on file    Emotionally abused: Not on file    Physically abused: Not on file    Forced sexual activity: Not on file  Other Topics Concern  . 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 BALLOON DILATION  04/03/2012   Procedure: BALLOON DILATION;  Surgeon: Beryle Beams, MD;  Location: [facility];  Service: Endoscopy;  Laterality: N/A;  . COLONOSCOPY    . DIAGNOSTIC LAPAROSCOPY  19yrs ago  . ESOPHAGEAL MANOMETRY N/A 09/04/2015   Procedure: ESOPHAGEAL MANOMETRY (EM);  Surgeon: Carol Ada, MD;  Location: [facility];  Service: Endoscopy;  Laterality: N/A;  . ESOPHAGOGASTRODUODENOSCOPY  04/03/2012   Procedure: ESOPHAGOGASTRODUODENOSCOPY (EGD);  Surgeon: Beryle Beams, MD;  [facility]: [facility];  Service: Endoscopy;  Laterality: N/A;  EGD w/ balloon dilation  . ESOPHAGOGASTRODUODENOSCOPY (EGD) WITH PROPOFOL N/A 02/18/2014   Procedure: ESOPHAGOGASTRODUODENOSCOPY (EGD) WITH PROPOFOL;  Surgeon: Beryle Beams, MD;  [facility];  Service: Endoscopy;  Laterality: N/A;  . ESOPHAGOGASTRODUODENOSCOPY (EGD) WITH PROPOFOL N/A 04/22/2014   Procedure: ESOPHAGOGASTRODUODENOSCOPY (EGD) WITH PROPOFOL;  Surgeon: Beryle Beams, MD;  [facility];  Service: Endoscopy;  Laterality: N/A;  . NASAL SEPTUM SURGERY  09/2016  . [place] IMPEDANCE STUDY N/A 09/04/2015   Procedure: [place] IMPEDANCE STUDY;  Surgeon: Carol Ada, MD;  [facility]: [facility];  Service: Endoscopy;  Laterality: N/A;  . REPLACEMENT TOTAL KNEE Right 54yrs ago  . TOTAL KNEE ARTHROPLASTY Left 11/30/2012   Procedure: LEFT TOTAL KNEE ARTHROPLASTY;  Surgeon: Kerin Salen, MD;  Location: [facility];  Service:  Orthopedics;  Laterality: Left;    Family History  Problem Relation Age of Onset  . Arthritis Mother   . Cancer Mother        breast  . Heart disease Mother        died from "heart problems" at age 38, had CAD  . Depression Mother   . Heart disease Father        had CABG  . Kidney disease Father   . Arthritis Sister   . Heart murmur Sister   . Arthritis Brother        "serious back/neck problems"  . Arthritis Sister   . Aneurysm Sister     Allergies  Allergen Reactions  . Naratriptan     Ineffective    Current Outpatient Medications on File Prior to Visit  Medication Sig Dispense Refill  . albuterol (PROVENTIL HFA;VENTOLIN HFA) 108 (90 Base) MCG/ACT inhaler Inhale 2 puffs into the lungs every 6 (six) hours as needed for wheezing or shortness of breath. 1 Inhaler 0  . aspirin EC 81 MG tablet Take 1 tablet (81 mg total) by mouth daily.    Marland Kitchen atorvastatin (LIPITOR) 20 MG tablet TAKE 1 TABLET(20 MG) BY MOUTH DAILY 30 tablet 5  . eletriptan (RELPAX) 40 MG tablet TAKE 1 TABLET BY MOUTH AS NEEDED FOR MIGRAINE OF HEADACHE. MAY REPEAT IN 2 HOURS IF HEADACHE PERSISTS OR RECURS 10 tablet 0  . escitalopram (LEXAPRO) 20 MG tablet TAKE 1 TABLET(20 MG) BY MOUTH DAILY 90 tablet 1  . fluorometholone (FML) 0.1 % ophthalmic suspension Place 1 drop into both eyes as needed.  1  . fluticasone (FLONASE) 50 MCG/ACT nasal spray SHAKE LIQUID AND USE 2 SPRAYS IN EACH NOSTRIL DAILY 48 g 0  . HYDROcodone-acetaminophen (NORCO/VICODIN) 5-325 MG per tablet 1 tablet. PRN  0  . ibuprofen (ADVIL,MOTRIN) 200 MG tablet Take 800 mg by mouth every 6 (six) hours as needed (Pain).    . meloxicam (MOBIC) 7.5 MG tablet TAKE 1 TABLET(7.5 MG) BY MOUTH DAILY 90 tablet 0  . mirabegron ER (MYRBETRIQ) 25 MG TB24 tablet Take 1 tablet (25 mg total) daily by mouth. 30 tablet 3  . omeprazole (PRILOSEC) 40 MG capsule Take 1 capsule (40 mg total) by mouth daily. 30 capsule 3  . RESTASIS 0.05 % ophthalmic emulsion     .  topiramate (TOPAMAX) 100 MG tablet TAKE 1 TABLET(100 MG) BY MOUTH TWICE DAILY 180 tablet 5  . topiramate (TOPAMAX) 50 MG tablet AKE 1/2 TABLET TWICE DAILY X 7 DAYS, THEN 1 TABLET TWICE DAILY X 7 DAYS THEN 1 AND 1/2 TABLET TWICE DAILY X7 DAYS THEN 2 TABLETS TWICE  DAILY (Patient taking differently: Take 100 mg by mouth 2 (two) times daily. AKE 1/2 TABLET TWICE DAILY X 7 DAYS, THEN 1 TABLET TWICE DAILY X 7 DAYS THEN 1 AND 1/2 TABLET TWICE DAILY X7 DAYS THEN 2 TABLETS TWICE DAILY) 360 tablet 1  . traMADol (ULTRAM) 50 MG tablet Take 50 mg by mouth as needed.  3   No current facility-administered medications on file prior to visit.     BP 125/65 (BP Location: Left Arm, Patient Position: Sitting, Cuff Size: Small)   Pulse 68   Temp 98.4 F (36.9 C) (Oral)   Resp 16   Ht 5\' 4"  (1.626 m)   Wt 151 lb 6.4 oz (68.7 kg)   SpO2 99%   BMI 25.99 kg/m       Objective:   Physical Exam  Constitutional: She appears well-developed and well-nourished.  HENT:  Head: Normocephalic and atraumatic.  Right Ear: Tympanic membrane and ear canal normal.  Left Ear: Tympanic membrane and ear canal normal.  Nose: Right sinus exhibits maxillary sinus tenderness and frontal sinus tenderness. Left sinus exhibits maxillary sinus tenderness and frontal sinus tenderness.  Mouth/Throat: No oropharyngeal exudate, posterior oropharyngeal edema or posterior oropharyngeal erythema.  Eyes: Conjunctivae are normal.  Cardiovascular: Normal rate, regular rhythm and normal heart sounds.  No murmur heard. Pulmonary/Chest: Effort normal and breath sounds normal. No respiratory distress. She has no wheezes.  Musculoskeletal: She exhibits no edema.  Neurological: She is alert.  Psychiatric: She has a normal mood and affect. Her behavior is normal. Judgment and thought content normal.          Assessment & Plan:  Sinusitis- new. Advised pt as follows: Continue flonase. Add claritin 10mg  once daily. Start augmentin twice  daily for 10 days. Call if new/worsening symptoms or if your symptoms are not improved in 3-4 days.

## 2017-12-22 NOTE — Patient Instructions (Signed)
Continue flonase. Add claritin 10mg  once daily. Start augmentin twice daily for 10 days. Call if new/worsening symptoms or if your symptoms are not improved in 3-4 days.

## 2017-12-29 ENCOUNTER — Other Ambulatory Visit: Payer: Self-pay | Admitting: Family

## 2018-01-05 ENCOUNTER — Other Ambulatory Visit: Payer: Self-pay | Admitting: Family

## 2018-01-09 ENCOUNTER — Encounter: Payer: Self-pay | Admitting: Neurology

## 2018-01-12 ENCOUNTER — Encounter: Payer: Self-pay | Admitting: Family Medicine

## 2018-01-12 ENCOUNTER — Ambulatory Visit (INDEPENDENT_AMBULATORY_CARE_PROVIDER_SITE_OTHER): Payer: Medicare Other | Admitting: Family Medicine

## 2018-01-12 VITALS — BP 126/86 | HR 76 | Temp 97.5°F | Resp 16 | Ht 64.0 in | Wt 152.4 lb

## 2018-01-12 DIAGNOSIS — G8929 Other chronic pain: Secondary | ICD-10-CM | POA: Diagnosis not present

## 2018-01-12 DIAGNOSIS — R350 Frequency of micturition: Secondary | ICD-10-CM | POA: Diagnosis not present

## 2018-01-12 DIAGNOSIS — M5442 Lumbago with sciatica, left side: Secondary | ICD-10-CM

## 2018-01-12 DIAGNOSIS — Z87448 Personal history of other diseases of urinary system: Secondary | ICD-10-CM

## 2018-01-12 LAB — POC URINALSYSI DIPSTICK (AUTOMATED)
Bilirubin, UA: NEGATIVE
Blood, UA: NEGATIVE
Glucose, UA: NEGATIVE
Ketones, UA: NEGATIVE
Leukocytes, UA: NEGATIVE
Nitrite, UA: NEGATIVE
Protein, UA: NEGATIVE
Spec Grav, UA: <=1.005 — AB (ref 1.010–1.025)
Urobilinogen, UA: 0.2 E.U./dL
pH, UA: 6 (ref 5.0–8.0)

## 2018-01-12 MED ORDER — PREDNISONE 10 MG PO TABS: ORAL_TABLET | ORAL | 0 refills | Status: DC

## 2018-01-12 MED ORDER — CYCLOBENZAPRINE HCL 10 MG PO TABS: 10.0000 mg | ORAL_TABLET | Freq: Three times a day (TID) | ORAL | 0 refills | Status: DC | PRN

## 2018-01-12 MED ORDER — KETOROLAC TROMETHAMINE 60 MG/2ML IM SOLN
60.0000 mg | Freq: Once | INTRAMUSCULAR | Status: AC
  Administered 2018-01-12: 60 mg via INTRAMUSCULAR

## 2018-01-12 NOTE — Patient Instructions (Signed)

## 2018-01-12 NOTE — Progress Notes (Signed)
Patient ID: KALIOPE QUINONEZ, female   DOB: 08-Mar-1956, 62 y.o.   MRN: [id number]    Subjective:  I acted as a Education administrator for Dr. Carollee Herter.  Guerry Bruin, [place]   Patient ID: [name], female    DOB: February 16, 1956, 62 y.o.   MRN: [id number]  Chief Complaint  Patient presents with  . Flank Pain    Flank Pain  This is a new problem. Episode onset: 1 week ago. The problem occurs intermittently. The quality of the pain is described as aching. Pertinent negatives include no bladder incontinence, chest pain, dysuria, fever or headaches.   pt c/o low back pain for several months.  No known injury.  She saw NP in Jan and xray were done that showed degenerative changes and disc space narrowing.  Pain has been worsening since. Patient is in today for flank pain.    No dysuria, frequency or vaginal d/c    Pain is across low back an radiates down L thigh.  + numbness and tingling.   Pt is concerned because she was told she had a cyst on her kidney many years ago and she is concerned it may be that.   Patient Care Team: Debbrah Alar, NP as PCP - General (Internal Medicine)   Past Medical History:  Diagnosis Date  . Arthritis   . Back pain    arthritis  . Depression    takes cymbalta daily  . GERD (gastroesophageal reflux disease)    takes Omeprazole daily  . Headache(784.0)    migraines  . Heart murmur   . History of gastric ulcer   . History of migraine    last one about 2wks ago-takes Imitrex prn  . Joint pain   . Joint swelling   . Meningioma ([place])   . Ulcer     Past Surgical History:  Procedure Laterality Date  . BALLOON DILATION  04/03/2012   Procedure: BALLOON DILATION;  Surgeon: Beryle Beams, MD;  [facility];  Service: Endoscopy;  Laterality: N/A;  . COLONOSCOPY    . DIAGNOSTIC LAPAROSCOPY  42yrs ago  . ESOPHAGEAL MANOMETRY N/A 09/04/2015   Procedure: ESOPHAGEAL MANOMETRY (EM);  Surgeon: Carol Ada, MD;  [facility];  Service: Endoscopy;   Laterality: N/A;  . ESOPHAGOGASTRODUODENOSCOPY  04/03/2012   Procedure: ESOPHAGOGASTRODUODENOSCOPY (EGD);  Surgeon: Beryle Beams, MD;  Location: [facility];  Service: Endoscopy;  Laterality: N/A;  EGD w/ balloon dilation  . ESOPHAGOGASTRODUODENOSCOPY (EGD) WITH PROPOFOL N/A 02/18/2014   Procedure: ESOPHAGOGASTRODUODENOSCOPY (EGD) WITH PROPOFOL;  Surgeon: Beryle Beams, MD;  [facility];  Service: Endoscopy;  Laterality: N/A;  . ESOPHAGOGASTRODUODENOSCOPY (EGD) WITH PROPOFOL N/A 04/22/2014   Procedure: ESOPHAGOGASTRODUODENOSCOPY (EGD) WITH PROPOFOL;  Surgeon: Beryle Beams, MD;  [facility];  Service: Endoscopy;  Laterality: N/A;  . NASAL SEPTUM SURGERY  09/2016  . [place] IMPEDANCE STUDY N/A 09/04/2015   Procedure: [place] IMPEDANCE STUDY;  Surgeon: Carol Ada, MD;  [facility];  Service: Endoscopy;  Laterality: N/A;  . REPLACEMENT TOTAL KNEE Right 21yrs ago  . TOTAL KNEE ARTHROPLASTY Left 11/30/2012   Procedure: LEFT TOTAL KNEE ARTHROPLASTY;  Surgeon: Kerin Salen, MD;  Location: [facility];  Service: Orthopedics;  Laterality: Left;    Family History  Problem Relation Age of Onset  . Arthritis Mother   . Cancer Mother        breast  . Heart disease Mother        died from "heart problems" at age 72,  had CAD  . Depression Mother   . Heart disease Father        had CABG  . Kidney disease Father   . Arthritis Sister   . Heart murmur Sister   . Arthritis Brother        "serious back/neck problems"  . Arthritis Sister   . Aneurysm Sister     Social History   Socioeconomic History  . Marital status: Single    Spouse name: Not on file  . Number of children: Not on file  . Years of education: Not on file  . Highest education level: Not on file  Occupational History  . Not on file  Social Needs  . Financial resource strain: Not on file  . Food insecurity:    Worry: Not on file    Inability: Not on file  . Transportation needs:    Medical: Not on file     Non-medical: Not on file  Tobacco Use  . Smoking status: Never Smoker  . Smokeless tobacco: Never Used  Substance and Sexual Activity  . Alcohol use: No  . Drug use: No  . Sexual activity: Never    Birth control/protection: Post-menopausal  Lifestyle  . Physical activity:    Days per week: Not on file    Minutes per session: Not on file  . Stress: Not on file  Relationships  . Social connections:    Talks on phone: Not on file    Gets together: Not on file    Attends religious service: Not on file    Active member of club or organization: Not on file    Attends meetings of clubs or organizations: Not on file    Relationship status: Not on file  . Intimate partner violence:    Fear of current or ex partner: Not on file    Emotionally abused: Not on file    Physically abused: Not on file    Forced sexual activity: Not on file  Other Topics Concern  . 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 albuterol (PROVENTIL HFA;VENTOLIN HFA) 108 (90 Base) MCG/ACT inhaler Inhale 2 puffs into the lungs every 6 (six) hours as needed for wheezing or shortness of breath. 1 Inhaler 0  . aspirin EC 81 MG tablet Take 1 tablet (81 mg total) by mouth daily.    Marland Kitchen atorvastatin (LIPITOR) 20 MG tablet TAKE 1 TABLET(20 MG) BY MOUTH DAILY 30 tablet 5  . eletriptan (RELPAX) 40 MG tablet TAKE 1 TABLET BY MOUTH AS NEEDED FOR MIGRAINE OF HEADACHE. MAY REPEAT IN 2 HOURS IF HEADACHE PERSISTS OR RECURS 10 tablet 0  . escitalopram (LEXAPRO) 20 MG tablet TAKE 1 TABLET(20 MG) BY MOUTH DAILY 90 tablet 1  . escitalopram (LEXAPRO) 20 MG tablet TAKE 1 TABLET(20 MG) BY MOUTH DAILY 90 tablet 1  . fluorometholone (FML) 0.1 % ophthalmic suspension Place 1 drop into both eyes as needed.  1  .  fluticasone (FLONASE) 50 MCG/ACT nasal spray SHAKE LIQUID AND USE 2 SPRAYS IN EACH NOSTRIL DAILY 48 g 0  . HYDROcodone-acetaminophen (NORCO/VICODIN) 5-325 MG per tablet 1 tablet. PRN  0  . ibuprofen (ADVIL,MOTRIN) 200 MG tablet Take 800 mg by mouth every 6 (six)  hours as needed (Pain).    . meloxicam (MOBIC) 7.5 MG tablet TAKE 1 TABLET(7.5 MG) BY MOUTH DAILY 90 tablet 0  . mirabegron ER (MYRBETRIQ) 25 MG TB24 tablet Take 1 tablet (25 mg total) daily by mouth. 30 tablet 3  . omeprazole (PRILOSEC) 40 MG capsule Take 1 capsule (40 mg total) by mouth daily. 30 capsule 3  . RESTASIS 0.05 % ophthalmic emulsion     . topiramate (TOPAMAX) 100 MG tablet TAKE 1 TABLET(100 MG) BY MOUTH TWICE DAILY 180 tablet 5  . topiramate (TOPAMAX) 50 MG tablet AKE 1/2 TABLET TWICE DAILY X 7 DAYS, THEN 1 TABLET TWICE DAILY X 7 DAYS THEN 1 AND 1/2 TABLET TWICE DAILY X7 DAYS THEN 2 TABLETS TWICE DAILY (Patient taking differently: Take 100 mg by mouth 2 (two) times daily. AKE 1/2 TABLET TWICE DAILY X 7 DAYS, THEN 1 TABLET TWICE DAILY X 7 DAYS THEN 1 AND 1/2 TABLET TWICE DAILY X7 DAYS THEN 2 TABLETS TWICE DAILY) 360 tablet 1  . traMADol (ULTRAM) 50 MG tablet Take 50 mg by mouth as needed.  3  . amoxicillin-clavulanate (AUGMENTIN) 875-125 MG tablet Take 1 tablet by mouth 2 (two) times daily. 20 tablet 0   No facility-administered medications prior to visit.     Allergies  Allergen Reactions  . Naratriptan     Ineffective    Review of Systems  Constitutional: Negative for fever and malaise/fatigue.  HENT: Negative for congestion.   Eyes: Negative for blurred vision.  Respiratory: Negative for cough and shortness of breath.   Cardiovascular: Negative for chest pain, palpitations and leg swelling.  Gastrointestinal: Negative for vomiting.  Genitourinary: Positive for flank pain, frequency and urgency. Negative for bladder incontinence and dysuria.  Musculoskeletal: Positive for back pain (bilateral back pain).  Skin:  Negative for rash.  Neurological: Negative for loss of consciousness and headaches.       Objective:    Physical Exam  Constitutional: She is oriented to person, place, and time. She appears well-developed and well-nourished.  HENT:  Head: Normocephalic and atraumatic.  Eyes: Conjunctivae and EOM are normal.  Neck: Normal range of motion. Neck supple. No JVD present. Carotid bruit is not present. No thyromegaly present.  Cardiovascular: Normal rate, regular rhythm and normal heart sounds.  No murmur heard. Pulmonary/Chest: Effort normal and breath sounds normal. No respiratory distress. She has no wheezes. She has no rales. She exhibits no tenderness.  Abdominal: Soft. There is no tenderness. There is no rebound and no guarding.  Musculoskeletal: She exhibits tenderness. She exhibits no edema.       Back:  Neurological: She is alert and oriented to person, place, and time. She displays normal reflexes. No cranial nerve deficit or sensory deficit. She exhibits normal muscle tone. Coordination normal.  Neg slr dtr + and b/l No dec strength in low ext Some pain with flexion and ext L spine but pt has full rom.    Psychiatric: She has a normal mood and affect.  Nursing note and vitals reviewed.   BP 126/86 (BP Location: Right Arm, Cuff Size: Normal)   Pulse 76   Temp (!) 97.5 F (36.4 C) (Oral)   Resp 16   Ht 5\' 4"  (1.626 m)   Wt 152 lb 6.4 oz (69.1 kg)   SpO2 98%   BMI 26.16 kg/m  Wt Readings from Last 3 Encounters:  01/12/18 152 lb 6.4 oz (69.1 kg)  12/22/17 151 lb 6.4 oz (68.7 kg)  09/30/17 156 lb  6.4 oz (70.9 kg)   BP Readings from Last 3 Encounters:  01/12/18 126/86  12/22/17 125/65  09/30/17 128/82     Immunization History  Administered Date(s) Administered  . Influenza,inj,Quad PF,6+ Mos 06/17/2016, 05/17/2017  . Tdap 08/05/2013    Health Maintenance  Topic Date Due  . PAP SMEAR  07/22/2016  . MAMMOGRAM  01/09/2018  . INFLUENZA VACCINE  04/16/2018  .  COLONOSCOPY  02/17/2022  . TETANUS/TDAP  08/06/2023  . Hepatitis C Screening  Completed  . HIV Screening  Completed    Lab Results  Component Value Date   WBC 5.4 07/22/2014   HGB 13.7 07/22/2014   HCT 41.2 07/22/2014   PLT 322.0 07/22/2014   GLUCOSE 78 01/07/2017   CHOL 246 (H) 01/07/2017   TRIG 85.0 01/07/2017   HDL 55.30 01/07/2017   LDLCALC 173 (H) 01/07/2017   ALT 9 01/07/2017   AST 16 01/07/2017   NA 141 01/07/2017   K 3.5 01/07/2017   CL 110 01/07/2017   CREATININE 0.74 01/07/2017   BUN 10 01/07/2017   CO2 22 01/07/2017   TSH 1.61 01/17/2016   INR 0.98 11/24/2012   HGBA1C  02/19/2007    5.2 (NOTE)   The ADA recommends the following therapeutic goals for glycemic   control related to Hgb A1C measurement:   Goal of Therapy:   < 7.0% Hgb A1C   Action Suggested:  > 8.0% Hgb A1C   Ref:  Diabetes Care, 22, Suppl. 1, 1999    Lab Results  Component Value Date   TSH 1.61 01/17/2016   Lab Results  Component Value Date   WBC 5.4 07/22/2014   HGB 13.7 07/22/2014   HCT 41.2 07/22/2014   MCV 90.3 07/22/2014   PLT 322.0 07/22/2014   Lab Results  Component Value Date   NA 141 01/07/2017   K 3.5 01/07/2017   CO2 22 01/07/2017   GLUCOSE 78 01/07/2017   BUN 10 01/07/2017   CREATININE 0.74 01/07/2017   BILITOT 0.3 01/07/2017   ALKPHOS 79 01/07/2017   AST 16 01/07/2017   ALT 9 01/07/2017   PROT 6.9 01/07/2017   ALBUMIN 4.2 01/07/2017   CALCIUM 8.9 01/07/2017   GFR 84.92 01/07/2017   Lab Results  Component Value Date   CHOL 246 (H) 01/07/2017   Lab Results  Component Value Date   HDL 55.30 01/07/2017   Lab Results  Component Value Date   LDLCALC 173 (H) 01/07/2017   Lab Results  Component Value Date   TRIG 85.0 01/07/2017   Lab Results  Component Value Date   CHOLHDL 4 01/07/2017   Lab Results  Component Value Date   HGBA1C  02/19/2007    5.2 (NOTE)   The ADA recommends the following therapeutic goals for glycemic   control related to Hgb A1C  measurement:   Goal of Therapy:   < 7.0% Hgb A1C   Action Suggested:  > 8.0% Hgb A1C   Ref:  Diabetes Care, 22, Suppl. 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 Bignell's amoxicillin-clavulanate. I am also having her start on cyclobenzaprine and predniSONE. Additionally, I am having her maintain her ibuprofen, HYDROcodone-acetaminophen, topiramate, albuterol, RESTASIS, fluorometholone, omeprazole, aspirin EC, fluticasone, topiramate, mirabegron ER, eletriptan, traMADol, escitalopram, atorvastatin, meloxicam, and escitalopram. We administered ketorolac.  Meds ordered this encounter  Medications  . cyclobenzaprine (FLEXERIL) 10 MG tablet    Sig: Take 1 tablet (10 mg total) by mouth 3 (three) times daily as needed for muscle spasms.    Dispense:  30 tablet    Refill:  0  . predniSONE (DELTASONE) 10 MG tablet    Sig: TAKE 3 TABLETS PO QD FOR 3 DAYS THEN TAKE 2 TABLETS PO QD FOR 3 DAYS THEN TAKE 1 TABLET PO QD FOR 3 DAYS THEN TAKE 1/2 TAB PO QD FOR 3 DAYS    Dispense:  20 tablet    Refill:  0  . ketorolac (TORADOL) injection 60 mg    CMA served as Education administrator during this visit. History, Physical and Plan performed by medical provider. Documentation and orders reviewed and attested to.  Ann Held, DO

## 2018-01-13 LAB — URINE CULTURE
MICRO NUMBER:: 90519071
Result:: NO GROWTH
SPECIMEN QUALITY:: ADEQUATE

## 2018-01-15 ENCOUNTER — Ambulatory Visit (HOSPITAL_BASED_OUTPATIENT_CLINIC_OR_DEPARTMENT_OTHER): Payer: Medicare Other

## 2018-01-20 ENCOUNTER — Ambulatory Visit (HOSPITAL_BASED_OUTPATIENT_CLINIC_OR_DEPARTMENT_OTHER)
Admission: RE | Admit: 2018-01-20 | Discharge: 2018-01-20 | Disposition: A | Discharge status: Home / Self Care | Payer: Medicare Other | Source: Ambulatory Visit | Attending: Family Medicine | Admitting: Family Medicine

## 2018-01-20 DIAGNOSIS — K8020 Calculus of gallbladder without cholecystitis without obstruction: Secondary | ICD-10-CM | POA: Diagnosis not present

## 2018-01-20 DIAGNOSIS — N281 Cyst of kidney, acquired: Secondary | ICD-10-CM | POA: Diagnosis not present

## 2018-01-20 DIAGNOSIS — Z87448 Personal history of other diseases of urinary system: Secondary | ICD-10-CM | POA: Diagnosis present

## 2018-01-21 ENCOUNTER — Telehealth: Payer: Self-pay | Admitting: *Deleted

## 2018-01-21 NOTE — Telephone Encounter (Signed)
Called patient back she is looking for ultrasound results from yesterday. I advised patient Dr. Etter Sjogren is not in the office and we will contact her after Dr. Reather Littler results and indicates any recommendations.

## 2018-01-21 NOTE — Telephone Encounter (Signed)
Melissa-- u/s was ordered from acute visit on 01/07/18.  Can you help?  Copied from [place] [phone]. Topic: Quick Communication - Lab Results >> Jan 21, 2018  2:40 PM Yvette Rack wrote: Pt called in requesting the results from 01/20/18 visit. Pt stated she was told someone would call her yesterday but she has not heard from anyone. Please call pt to discuss results.

## 2018-01-26 NOTE — Telephone Encounter (Signed)
Notified pt and she is agreeable to proceed with ortho follow up. She also requests that we fax back xrays from January to Lindsay Frost (ortho). Results faxed.

## 2018-01-26 NOTE — Telephone Encounter (Signed)
Pt states she really needs to hear back about ultrasound, because she is in a lot of pain. Please call back asap.  Pt needs to know if she needs to see her orthopedic.

## 2018-01-26 NOTE — Telephone Encounter (Signed)
I reviewed results. Some cysts noted on kidney but they appear benign and are unlikely cause for her flank pain, likely musculoskeletal in nature, urine culture was negative. I think it is reasonable for her to schedule follow up with her orthopedist.

## 2018-02-17 ENCOUNTER — Encounter: Payer: Self-pay | Admitting: Neurology

## 2018-02-17 ENCOUNTER — Ambulatory Visit (INDEPENDENT_AMBULATORY_CARE_PROVIDER_SITE_OTHER): Payer: Medicare Other | Admitting: Neurology

## 2018-02-17 VITALS — BP 124/88 | HR 78 | Ht 64.0 in | Wt 151.0 lb

## 2018-02-17 DIAGNOSIS — G43009 Migraine without aura, not intractable, without status migrainosus: Secondary | ICD-10-CM | POA: Diagnosis not present

## 2018-02-17 DIAGNOSIS — M542 Cervicalgia: Secondary | ICD-10-CM

## 2018-02-17 DIAGNOSIS — G40209 Localization-related (focal) (partial) symptomatic epilepsy and epileptic syndromes with complex partial seizures, not intractable, without status epilepticus: Secondary | ICD-10-CM

## 2018-02-17 NOTE — Patient Instructions (Signed)
1.  Continue topiramate 100mg  twice daily 2.  Relpax as needed 3.  Limit use of pain relievers to no more than 2 days out of week 4.  Try taking cyclobenzaprine 10mg  at bedtime for neck pain 5.  Follow up in 6 months.

## 2018-02-17 NOTE — Progress Notes (Signed)
NEUROLOGY FOLLOW UP OFFICE NOTE  Lindsay Frost [id number]  HISTORY OF PRESENT ILLNESS: Lindsay Frost is a 62 year old right-handed woman with history of depression, migraine, and GERD who follows up for migraine without aura and possible history of seizures.     UPDATE: Migraine: She has not had any of her habitual migraines. She wakes up in the morning with mild to moderate non-throbbing headache on the crown of her head.  It lasts an hour often without treatment.  It occurs 2 days a week.  She reports neck pain.  Current abortive therapy: Relpax, ibuprofen, BC (rarely) Current preventative:  topiramate 100mg  twice daily Other medication:  Lexapro, cyclobenzaprine as needed for back pain, tramadol   Caffeine:  Coffee, tea, soda Alcohol:  no Smoker:  no Diet:  Junk food Exercise:  Walks dog Depression/anxiety:  controlled Sleep hygiene:  Good.     Seizures: No seizures.  Last spell occurred 05/27/15.   Current preventative: topiramate 100mg  twice daily   HISTORY: Migraine: Onset:  Early 40s Location:  Left frontal Quality:  Stabbing Initial Intensity:  10 out of 10; September 7/10 Aura:  No Prodrome:  Photophobia Associated symptoms:  Nausea, phonophobia, photophobia, sometimes vomiting. No osmophobia, visual disturbance, or autonomic symptoms.  No associated unilateral numbness or weakness. Initial Duration:  2-3 days; September 3 hours with Relpax Initial Frequency:  Migraines occur every 2 weeks (4-6 days per month) and dull headaches occur 2 times a week (8 days per month). Total of 14 headache days per month; September 3 times a month Triggers/exacerbating factors:  Heat Relieving factors:  Laying down in a quiet, dark, cool room with ice pack. Activity:  Cannot function when she has migraine   Past abortive therapy:  sumatriptan 50mg  (ineffective, caused racing heart), Maxalt (ineffective), BC powder Past preventative therapy:  Topamax (side effects,  ineffective), Cymbalta (for depression), nortriptyline 50mg  (discontinued in order to simplify medication list) Unable to start Inderal LA because insurance wouldn't cover it.  Family history of headache:  daughter  Seizure: Her Wellbutrin was increased in August 2016 to help control her depression.    At around that time, she had an episode where she woke up and found herself on the bathroom floor.  She didn't remember falling.  Later in the month, she had another episode where she woke up on the floor next to her bed and noted that she was briefly kicking both legs.  She had a severe rug burn on the left side of her face.  She did not bite her tongue during either event but she did have some urinary incontinence during the episode in the bathroom.  Her PCP, Dr. Birdie Frost, subsequently decreased her dose of Wellbutrin.  She has no prior history of seizures.  She had a sleep deprived EEG performed on 06/07/14, which showed brief intermittent transient sharp waves and slowing in the left temporal region, which although not epileptiform, could be a seizure focus.   On 05/27/15, she had a different spell.  It was unwitnessed.  When she woke up, she was short of breath and it took a few minutes to recover.  Sometimes, she will wake up short of breath, but this episode was worse.  She did not have incontinence or tongue biting.     MRI of brain with and without contrast from 08/19/13 demonstrated a 12 mm left parietal calcified meningioma without surrounding vasogenic edema, minimally increased compared to prior study from 10/2003. MRI of the brain with  and without contrast was performed on 06/08/14, which demonstrated stability of the meningioma.  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   Past antiepileptic medication:  Keppra  PAST MEDICAL HISTORY: Past Medical History:    Diagnosis Date  . Arthritis   . Back pain    arthritis  . Depression    takes cymbalta daily  . GERD (gastroesophageal reflux disease)    takes Omeprazole daily  . Headache(784.0)    migraines  . Heart murmur   . History of gastric ulcer   . History of migraine    last one about 2wks ago-takes Imitrex prn  . Joint pain   . Joint swelling   . Meningioma ([place])   . Ulcer     MEDICATIONS: Current Outpatient Medications on File Prior to Visit  Medication Sig Dispense Refill  . albuterol (PROVENTIL HFA;VENTOLIN HFA) 108 (90 Base) MCG/ACT inhaler Inhale 2 puffs into the lungs every 6 (six) hours as needed for wheezing or shortness of breath. 1 Inhaler 0  . aspirin EC 81 MG tablet Take 1 tablet (81 mg total) by mouth daily.    Marland Kitchen atorvastatin (LIPITOR) 20 MG tablet TAKE 1 TABLET(20 MG) BY MOUTH DAILY 30 tablet 5  . cyclobenzaprine (FLEXERIL) 10 MG tablet Take 1 tablet (10 mg total) by mouth 3 (three) times daily as needed for muscle spasms. 30 tablet 0  . eletriptan (RELPAX) 40 MG tablet TAKE 1 TABLET BY MOUTH AS NEEDED FOR MIGRAINE OF HEADACHE. MAY REPEAT IN 2 HOURS IF HEADACHE PERSISTS OR RECURS 10 tablet 0  . escitalopram (LEXAPRO) 20 MG tablet TAKE 1 TABLET(20 MG) BY MOUTH DAILY 90 tablet 1  . escitalopram (LEXAPRO) 20 MG tablet TAKE 1 TABLET(20 MG) BY MOUTH DAILY 90 tablet 1  . fluorometholone (FML) 0.1 % ophthalmic suspension Place 1 drop into both eyes as needed.  1  . fluticasone (FLONASE) 50 MCG/ACT nasal spray SHAKE LIQUID AND USE 2 SPRAYS IN EACH NOSTRIL DAILY 48 g 0  . HYDROcodone-acetaminophen (NORCO/VICODIN) 5-325 MG per tablet 1 tablet. PRN  0  . ibuprofen (ADVIL,MOTRIN) 200 MG tablet Take 800 mg by mouth every 6 (six) hours as needed (Pain).    . meloxicam (MOBIC) 7.5 MG tablet TAKE 1 TABLET(7.5 MG) BY MOUTH DAILY 90 tablet 0  . mirabegron ER (MYRBETRIQ) 25 MG TB24 tablet Take 1 tablet (25 mg total) daily by mouth. 30 tablet 3  . omeprazole (PRILOSEC) 40 MG capsule Take 1  capsule (40 mg total) by mouth daily. 30 capsule 3  . predniSONE (DELTASONE) 10 MG tablet TAKE 3 TABLETS PO QD FOR 3 DAYS THEN TAKE 2 TABLETS PO QD FOR 3 DAYS THEN TAKE 1 TABLET PO QD FOR 3 DAYS THEN TAKE 1/2 TAB PO QD FOR 3 DAYS 20 tablet 0  . RESTASIS 0.05 % ophthalmic emulsion     . topiramate (TOPAMAX) 100 MG tablet TAKE 1 TABLET(100 MG) BY MOUTH TWICE DAILY 180 tablet 5  . topiramate (TOPAMAX) 50 MG tablet AKE 1/2 TABLET TWICE DAILY X 7 DAYS, THEN 1 TABLET TWICE DAILY X 7 DAYS THEN 1 AND 1/2 TABLET TWICE DAILY X7 DAYS THEN 2 TABLETS TWICE DAILY (Patient not taking: Reported on 02/17/2018) 360 tablet 1  . traMADol (ULTRAM) 50 MG tablet Take 50 mg by mouth as needed.  3   No current facility-administered medications on file prior to visit.  ALLERGIES: Allergies  Allergen Reactions  . Naratriptan     Ineffective    FAMILY HISTORY: Family History  Problem Relation Age of Onset  . Arthritis Mother   . Cancer Mother        breast  . Heart disease Mother        died from "heart problems" at age 71, had CAD  . Depression Mother   . Heart disease Father        had CABG  . Kidney disease Father   . Arthritis Sister   . Heart murmur Sister   . Arthritis Brother        "serious back/neck problems"  . Arthritis Sister   . Aneurysm Sister     SOCIAL HISTORY: Social History   Socioeconomic History  . Marital status: Single    Spouse name: Not on file  . Number of children: Not on file  . Years of education: Not on file  . Highest education level: Not on file  Occupational History  . Not on file  Social Needs  . Financial resource strain: Not on file  . Food insecurity:    Worry: Not on file    Inability: Not on file  . Transportation needs:    Medical: Not on file    Non-medical: Not on file  Tobacco Use  . Smoking status: Never Smoker  . Smokeless tobacco: Never Used  Substance and Sexual Activity  . Alcohol use: No  . Drug use: No  . Sexual activity: Never     Birth control/protection: Post-menopausal  Lifestyle  . Physical activity:    Days per week: Not on file    Minutes per session: Not on file  . Stress: Not on file  Relationships  . Social connections:    Talks on phone: Not on file    Gets together: Not on file    Attends religious service: Not on file    Active member of club or organization: Not on file    Attends meetings of clubs or organizations: Not on file    Relationship status: Not on file  . Intimate partner violence:    Fear of current or ex partner: Not on file    Emotionally abused: Not on file    Physically abused: Not on file    Forced sexual activity: Not on file  Other Topics Concern  . 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, mining   Single   1 daughter- lives in [place]   2 dogs   Enjoys reading, watching television    REVIEW OF SYSTEMS: Constitutional: No fevers, chills, or sweats, no generalized fatigue, change in appetite Eyes: No visual changes, double vision, eye pain Ear, nose and throat: No hearing loss, ear pain, nasal congestion, sore throat Cardiovascular: No chest pain, palpitations Respiratory:  No shortness of breath at rest or with exertion, wheezes GastrointestinaI: No nausea, vomiting, diarrhea, abdominal pain, fecal incontinence Genitourinary:  No dysuria, urinary retention or frequency Musculoskeletal:  Neck pain, back pain Integumentary: No rash, pruritus, skin lesions Neurological: as above Psychiatric: No depression, insomnia, anxiety Endocrine: No palpitations, fatigue, diaphoresis, mood swings, change in appetite, change in weight, increased thirst Hematologic/Lymphatic:  No purpura, petechiae. Allergic/Immunologic: no itchy/runny eyes, nasal congestion, recent allergic reactions, rashes  PHYSICAL EXAM: Vitals:   02/17/18 1529  BP: 124/88  Pulse: 78  SpO2: 94%   General: No acute distress.  Patient appears  well-groomed.  Head:  Normocephalic/atraumatic Eyes:  Fundi examined but not visualized Neck: supple, bilateral paraspinal tenderness, full range of motion Heart:  Regular rate and rhythm Lungs:  Clear to auscultation bilaterally Back: No paraspinal tenderness Neurological Exam: alert and oriented to person, place, and time. Attention span and concentration intact, recent and remote memory intact, fund of knowledge intact.  Speech fluent and not dysarthric, language intact.  CN II-XII intact. Bulk and tone normal, muscle strength 5/5 throughout.  Sensation to light touch  intact.  Deep tendon reflexes 2+ throughout.  Finger to nose testing intact.  Gait normal, Romberg negative.  IMPRESSION: Migraine without aura, not intractable Cervicalgia, possibly triggering posterior headaches. Seizure disorder, stable  PLAN: 1.  Continue topiramate 100mg  twice daily 2.  Relpax or ibuprofen for abortive therapy 3.  Limit pain relievers to no more than 2 days out of week to prevent rebound headache. 4.  Keep headache diary 5.  Try taking cyclobenzaprine 10mg  at bedtime for neck pain.  6.  Follow up in 6 months.  Metta Clines, DO  CC: Debbrah Alar, NP

## 2018-02-25 DIAGNOSIS — K5900 Constipation, unspecified: Secondary | ICD-10-CM | POA: Diagnosis not present

## 2018-02-25 DIAGNOSIS — K219 Gastro-esophageal reflux disease without esophagitis: Secondary | ICD-10-CM | POA: Diagnosis not present

## 2018-02-25 DIAGNOSIS — R0781 Pleurodynia: Secondary | ICD-10-CM | POA: Diagnosis not present

## 2018-03-09 ENCOUNTER — Other Ambulatory Visit: Payer: Self-pay | Admitting: Neurology

## 2018-03-16 DIAGNOSIS — R1013 Epigastric pain: Secondary | ICD-10-CM | POA: Diagnosis not present

## 2018-03-16 DIAGNOSIS — M549 Dorsalgia, unspecified: Secondary | ICD-10-CM | POA: Diagnosis not present

## 2018-03-16 DIAGNOSIS — K8020 Calculus of gallbladder without cholecystitis without obstruction: Secondary | ICD-10-CM | POA: Diagnosis not present

## 2018-03-17 DIAGNOSIS — M545 Low back pain: Secondary | ICD-10-CM | POA: Diagnosis not present

## 2018-03-24 DIAGNOSIS — K269 Duodenal ulcer, unspecified as acute or chronic, without hemorrhage or perforation: Secondary | ICD-10-CM | POA: Diagnosis not present

## 2018-03-24 DIAGNOSIS — R1013 Epigastric pain: Secondary | ICD-10-CM | POA: Diagnosis not present

## 2018-03-24 DIAGNOSIS — K259 Gastric ulcer, unspecified as acute or chronic, without hemorrhage or perforation: Secondary | ICD-10-CM | POA: Diagnosis not present

## 2018-03-31 ENCOUNTER — Ambulatory Visit: Payer: Self-pay | Admitting: Surgery

## 2018-03-31 DIAGNOSIS — K8050 Calculus of bile duct without cholangitis or cholecystitis without obstruction: Secondary | ICD-10-CM | POA: Diagnosis not present

## 2018-03-31 NOTE — H&P (Signed)
Lindsay Frost Documented: 03/31/2018 3:28 PM Location: [facility] Patient #: [id number] DOB: 04-28-1956 Divorced / Language: Lindsay Frost / Race: White Female  History of Present Illness (Lindsay Frost A. Lindsay Heller MD; 03/31/2018 3:46 PM) Patient words: This is a very pleasant 62 year old woman who is referred by Dr. Benson Frost for possible cholecystectomy. She has been having issues with abdominal pain since about January of this year. The pain is in the epigastrium and right upper quadrant, radiates around to the back. Associated with nausea and vomiting, occasional night sweats. The pain comes on an hour or after eating. It is a severe, crampy type pain.  She does have a known history of ulcers. She states that she had an endoscopy just a few days ago and was told that the ulcers are stable, have not really healed or worsened. She takes a PPI, and has been dealing with this issue for at least a decade.  No prior abdominal surgeries.  The patient is a 62 year old female.   Past Surgical History [name], [place]; 03/31/2018 3:28 PM) Knee Surgery Bilateral. Oral Surgery  Diagnostic Studies History ([name], [name]; 03/31/2018 3:28 PM) Colonoscopy 1-5 years ago Mammogram 1-3 years ago Pap Smear 1-5 years ago  Allergies [name], CMA; 03/31/2018 3:31 PM) NARATRIPTAN  Medication History ([name], CMA; 03/31/2018 3:35 PM) Eletriptan Hydrobromide (40MG  Tablet, Oral) Active. Aspirin (81MG  Tablet, Oral) Active. Albuterol Sulfate HFA (108 (90 Base)MCG/ACT Aerosol Soln, Inhalation) Active. Fluorometholone (0.1% Suspension, Ophthalmic) Active. Restasis (0.05% Emulsion, Ophthalmic) Active. Ibuprofen (200MG  Tablet, Oral) Active. Mobic (7.5MG  Tablet, Oral) Active. Mirabegron ER (25MG  Tablet ER 24HR, Oral) Active. Omeprazole (40MG  Capsule DR, Oral) Active. Topiramate (100MG  Tablet, Oral) Active. Sucralfate (1GM Tablet, Oral) Active. Medications  Reconciled  Social History [name], CMA; 03/31/2018 3:28 PM) Alcohol use Occasional alcohol use. Caffeine use Coffee. No drug use Tobacco use Never smoker.  Family History [name], [place]; 03/31/2018 3:28 PM) Arthritis Mother. Breast Cancer Mother. Diabetes Mellitus Father. Heart Disease Father, Mother. Heart disease in female family member before age 21 Kidney Disease Father.  Pregnancy / Birth History [name], [place]; 03/31/2018 3:28 PM) Age at menarche 56 years. Age of menopause <45 Gravida 1 Irregular periods Maternal age 47-25 Para 54  Other Problems [name], [place]; 03/31/2018 3:28 PM) Arthritis Back Pain Bladder Problems Depression Gastric Ulcer Gastroesophageal Reflux Disease Migraine Headache     Review of Systems ([name]; 03/31/2018 3:28 PM) General Present- Weight Loss. Not Present- Appetite Loss, Chills, Fatigue, Fever, Night Sweats and Weight Gain. Skin Not Present- Change in Wart/Mole, Dryness, Hives, Jaundice, New Lesions, Non-Healing Wounds, Rash and Ulcer. HEENT Present- Hearing Loss, Sinus Pain and Wears glasses/contact lenses. Not Present- Earache, Hoarseness, Nose Bleed, Oral Ulcers, Ringing in the Ears, Seasonal Allergies, Sore Throat, Visual Disturbances and Yellow Eyes. Respiratory Not Present- Bloody sputum, Chronic Cough, Difficulty Breathing, Snoring and Wheezing. Breast Not Present- Breast Mass, Breast Pain, Nipple Discharge and Skin Changes. Cardiovascular Not Present- Chest Pain, Difficulty Breathing Lying Down, Leg Cramps, Palpitations, Rapid Heart Rate, Shortness of Breath and Swelling of Extremities. Gastrointestinal Present- Abdominal Pain, Bloating, Change in Bowel Habits, Gets full quickly at meals, Indigestion, Nausea and Vomiting. Not Present- Bloody Stool, Chronic diarrhea, Constipation, Difficulty Swallowing, Excessive gas, Hemorrhoids and Rectal Pain. Female Genitourinary Present-  Frequency. Not Present- Nocturia, Painful Urination, Pelvic Pain and Urgency. Musculoskeletal Present- Back Pain and Joint Pain. Not Present- Joint Stiffness, Muscle Pain, Muscle Weakness and Swelling of Extremities. Neurological Present- Headaches. Not Present- Decreased Memory,  Fainting, Numbness, Seizures, Tingling, Tremor, Trouble walking and Weakness. Psychiatric Present- Depression. Not Present- Anxiety, Bipolar, Change in Sleep Pattern, Fearful and Frequent crying. Endocrine Not Present- Cold Intolerance, Excessive Hunger, Hair Changes, Heat Intolerance, Hot flashes and New Diabetes. Hematology Not Present- Blood Thinners, Easy Bruising, Excessive bleeding, Gland problems, HIV and Persistent Infections.  Vitals ([name]; 03/31/2018 3:30 PM) 03/31/2018 3:29 PM Weight: 150.13 lb Height: 64in Body Surface Area: 1.73 m Body Mass Index: 25.77 kg/m  Temp.: 97.67F  Pulse: 80 (Regular)  P.OX: 100% (Room air) BP: 120/90 (Sitting, Left Arm, Standard)      Physical Exam ([name] A. Lindsay Heller MD; 03/31/2018 3:46 PM)  The physical exam findings are as follows: Note:Gen: alert and well appearing Eye: extraocular motion intact, no scleral icterus ENT: moist mucus membranes, dentition intact Neck: no mass or thyromegaly Chest: unlabored respirations, symmetrical air entry, clear bilaterally CV: regular rate and rhythm, no pedal edema Abdomen: soft, mildly tender in the epigastrium and right subcostal margin, nondistended. No mass or organomegaly MSK: strength symmetrical throughout, no deformity Neuro: grossly intact, normal gait Psych: normal mood and affect, appropriate insight Skin: warm and dry, no rash or lesion on limited exam    Assessment & Plan ([name] A. Lindsay Heller MD; 03/31/2018 0:05 PM)  BILIARY COLIC (R10.21) Story: I recommend proceeding with laparoscopic cholecystectomy with possible cholangiogram. Discussed risks of surgery including bleeding, pain,  scarring, intraabdominal injury specifically to the common bile duct and sequelae, conversion to open surgery, failure to resolve symptoms, blood clots/ pulmonary embolus, heart attack, pneumonia, stroke, death. Questions welcomed and answered to patient's satisfaction.

## 2018-03-31 NOTE — H&P (View-Only) (Signed)
Lindsay Frost Documented: 03/31/2018 3:28 PM Location: [facility] Patient #: [id number] DOB: [birth year] Divorced / Language: [name] / Race: White Female  History of Present Illness (Jerimyah Vandunk A. Kae Heller MD; 03/31/2018 3:46 PM) Patient words: This is a very pleasant 62 year old woman who is referred by Dr. Benson [place] for possible cholecystectomy. She has been having issues with abdominal pain since about January of this year. The pain is in the epigastrium and right upper quadrant, radiates around to the back. Associated with nausea and vomiting, occasional night sweats. The pain comes on an hour or after eating. It is a severe, crampy type pain.  She does have a known history of ulcers. She states that she had an endoscopy just a few days ago and was told that the ulcers are stable, have not really healed or worsened. She takes a PPI, and has been dealing with this issue for at least a decade.  No prior abdominal surgeries.  The patient is a 62 year old female.   Past Surgical History [name], [place]; 03/31/2018 3:28 PM) Knee Surgery Bilateral. Oral Surgery  Diagnostic Studies History ([name], [place]; 03/31/2018 3:28 PM) Colonoscopy 1-5 years ago Mammogram 1-3 years ago Pap Smear 1-5 years ago  Allergies [name], CMA; 03/31/2018 3:31 PM) NARATRIPTAN  Medication History ([name], CMA; 03/31/2018 3:35 PM) Eletriptan Hydrobromide (40MG  Tablet, Oral) Active. Aspirin (81MG  Tablet, Oral) Active. Albuterol Sulfate HFA (108 (90 Base)MCG/ACT Aerosol Soln, Inhalation) Active. Fluorometholone (0.1% Suspension, Ophthalmic) Active. Restasis (0.05% Emulsion, Ophthalmic) Active. Ibuprofen (200MG  Tablet, Oral) Active. Mobic (7.5MG  Tablet, Oral) Active. Mirabegron ER (25MG  Tablet ER 24HR, Oral) Active. Omeprazole (40MG  Capsule DR, Oral) Active. Topiramate (100MG  Tablet, Oral) Active. Sucralfate (1GM Tablet, Oral) Active. Medications  Reconciled  Social History [name], CMA; 03/31/2018 3:28 PM) Alcohol use Occasional alcohol use. Caffeine use Coffee. No drug use Tobacco use Never smoker.  Family History [name], [place]; 03/31/2018 3:28 PM) Arthritis Mother. Breast Cancer Mother. Diabetes Mellitus Father. Heart Disease Father, Mother. Heart disease in female family member before age 55 Kidney Disease Father.  Pregnancy / Birth History [name], [place]; 03/31/2018 3:28 PM) Age at menarche 77 years. Age of menopause <45 Gravida 1 Irregular periods Maternal age 33-25 Para 91  Other Problems [name], [place]; 03/31/2018 3:28 PM) Arthritis Back Pain Bladder Problems Depression Gastric Ulcer Gastroesophageal Reflux Disease Migraine Headache     Review of Systems ([name]; 03/31/2018 3:28 PM) General Present- Weight Loss. Not Present- Appetite Loss, Chills, Fatigue, Fever, Night Sweats and Weight Gain. Skin Not Present- Change in Wart/Mole, Dryness, Hives, Jaundice, New Lesions, Non-Healing Wounds, Rash and Ulcer. HEENT Present- Hearing Loss, Sinus Pain and Wears glasses/contact lenses. Not Present- Earache, Hoarseness, Nose Bleed, Oral Ulcers, Ringing in the Ears, Seasonal Allergies, Sore Throat, Visual Disturbances and Yellow Eyes. Respiratory Not Present- Bloody sputum, Chronic Cough, Difficulty Breathing, Snoring and Wheezing. Breast Not Present- Breast Mass, Breast Pain, Nipple Discharge and Skin Changes. Cardiovascular Not Present- Chest Pain, Difficulty Breathing Lying Down, Leg Cramps, Palpitations, Rapid Heart Rate, Shortness of Breath and Swelling of Extremities. Gastrointestinal Present- Abdominal Pain, Bloating, Change in Bowel Habits, Gets full quickly at meals, Indigestion, Nausea and Vomiting. Not Present- Bloody Stool, Chronic diarrhea, Constipation, Difficulty Swallowing, Excessive gas, Hemorrhoids and Rectal Pain. Female Genitourinary Present-  Frequency. Not Present- Nocturia, Painful Urination, Pelvic Pain and Urgency. Musculoskeletal Present- Back Pain and Joint Pain. Not Present- Joint Stiffness, Muscle Pain, Muscle Weakness and Swelling of Extremities. Neurological Present- Headaches. Not Present- Decreased Memory,  Fainting, Numbness, Seizures, Tingling, Tremor, Trouble walking and Weakness. Psychiatric Present- Depression. Not Present- Anxiety, Bipolar, Change in Sleep Pattern, Fearful and Frequent crying. Endocrine Not Present- Cold Intolerance, Excessive Hunger, Hair Changes, Heat Intolerance, Hot flashes and New Diabetes. Hematology Not Present- Blood Thinners, Easy Bruising, Excessive bleeding, Gland problems, HIV and Persistent Infections.  Vitals ([name]; 03/31/2018 3:30 PM) 03/31/2018 3:29 PM Weight: 150.13 lb Height: 64in Body Surface Area: 1.73 m Body Mass Index: 25.77 kg/m  Temp.: 97.81F  Pulse: 80 (Regular)  P.OX: 100% (Room air) BP: 120/90 (Sitting, Left Arm, Standard)      Physical Exam (Oluwadamilare Tobler A. Kae Heller MD; 03/31/2018 3:46 PM)  The physical exam findings are as follows: Note:Gen: alert and well appearing Eye: extraocular motion intact, no scleral icterus ENT: moist mucus membranes, dentition intact Neck: no mass or thyromegaly Chest: unlabored respirations, symmetrical air entry, clear bilaterally CV: regular rate and rhythm, no pedal edema Abdomen: soft, mildly tender in the epigastrium and right subcostal margin, nondistended. No mass or organomegaly MSK: strength symmetrical throughout, no deformity Neuro: grossly intact, normal gait Psych: normal mood and affect, appropriate insight Skin: warm and dry, no rash or lesion on limited exam    Assessment & Plan (Emil Klassen A. Kae Heller MD; 03/31/2018 8:97 PM)  BILIARY COLIC (O47.84) Story: I recommend proceeding with laparoscopic cholecystectomy with possible cholangiogram. Discussed risks of surgery including bleeding, pain,  scarring, intraabdominal injury specifically to the common bile duct and sequelae, conversion to open surgery, failure to resolve symptoms, blood clots/ pulmonary embolus, heart attack, pneumonia, stroke, death. Questions welcomed and answered to patient's satisfaction.

## 2018-04-17 NOTE — Patient Instructions (Addendum)
Analicia Skibinski Stigall  04/17/2018   Your procedure is scheduled on: Friday 04/24/2018  Report to [facility] Main  Entrance              Report to admitting at  0530  AM    Call this number if you have problems the morning of surgery [phone]    Remember: Do not eat food or drink liquids :After Midnight.     Take these medicines the morning of surgery with A SIP OF WATER: Escitalopram (Lexapro), Omeprazole (Prilosec), Topiramate (Topamax), use eye drops as needed                                 You may not have any metal on your body including hair pins and              piercings  Do not wear jewelry, make-up, lotions, powders or perfumes, deodorant             Do not wear nail polish.  Do not shave  48 hours prior to surgery.               Do not bring valuables to the hospital. [place].  Contacts, dentures or bridgework may not be worn into surgery.  Leave suitcase in the car. After surgery it may be brought to your room.     Patients discharged the day of surgery will not be allowed to drive home.  Name and phone number of your driver:daughter- Hope                Please read over the following fact sheets you were given: _____________________________________________________________________             [facility] - Preparing for Surgery Before surgery, you can play an important role.  Because skin is not sterile, your skin needs to be as free of germs as possible.  You can reduce the number of germs on your skin by washing with CHG (chlorahexidine gluconate) soap before surgery.  CHG is an antiseptic cleaner which kills germs and bonds with the skin to continue killing germs even after washing. Please DO NOT use if you have an allergy to CHG or antibacterial soaps.  If your skin becomes reddened/irritated stop using the CHG and inform your nurse when you arrive at Short Stay. Do not shave (including  legs and underarms) for at least 48 hours prior to the first CHG shower.  You may shave your face/neck. Please follow these instructions carefully:  1.  Shower with CHG Soap the night before surgery and the  morning of Surgery.  2.  If you choose to wash your hair, wash your hair first as usual with your  normal  shampoo.  3.  After you shampoo, rinse your hair and body thoroughly to remove the  shampoo.                           4.  Use CHG as you would any other liquid soap.  You can apply chg directly  to the skin and wash  Gently with a scrungie or clean washcloth.  5.  Apply the CHG Soap to your body ONLY FROM THE NECK DOWN.   Do not use on face/ open                           Wound or open sores. Avoid contact with eyes, ears mouth and genitals (private parts).                       Wash face,  Genitals (private parts) with your normal soap.             6.  Wash thoroughly, paying special attention to the area where your surgery  will be performed.  7.  Thoroughly rinse your body with warm water from the neck down.  8.  DO NOT shower/wash with your normal soap after using and rinsing off  the CHG Soap.                9.  Pat yourself dry with a clean towel.            10.  Wear clean pajamas.            11.  Place clean sheets on your bed the night of your first shower and do not  sleep with pets. Day of Surgery : Do not apply any lotions/deodorants the morning of surgery.  Please wear clean clothes to the hospital/surgery center.  FAILURE TO FOLLOW THESE INSTRUCTIONS MAY RESULT IN THE CANCELLATION OF YOUR SURGERY PATIENT SIGNATURE_________________________________  NURSE SIGNATURE__________________________________  ________________________________________________________________________

## 2018-04-20 ENCOUNTER — Other Ambulatory Visit: Payer: Self-pay

## 2018-04-20 ENCOUNTER — Encounter (HOSPITAL_COMMUNITY): Payer: Self-pay

## 2018-04-20 ENCOUNTER — Encounter (HOSPITAL_COMMUNITY)
Admission: RE | Admit: 2018-04-20 | Discharge: 2018-04-20 | Disposition: A | Discharge status: Home / Self Care | Payer: Medicare Other | Source: Ambulatory Visit | Attending: Surgery | Admitting: Surgery

## 2018-04-20 DIAGNOSIS — Z01818 Encounter for other preprocedural examination: Secondary | ICD-10-CM | POA: Insufficient documentation

## 2018-04-20 DIAGNOSIS — Z01812 Encounter for preprocedural laboratory examination: Secondary | ICD-10-CM | POA: Insufficient documentation

## 2018-04-20 DIAGNOSIS — R1084 Generalized abdominal pain: Secondary | ICD-10-CM | POA: Insufficient documentation

## 2018-04-20 LAB — COMPREHENSIVE METABOLIC PANEL
ALT: 10 U/L (ref 0–44)
AST: 16 U/L (ref 15–41)
Albumin: 3.7 g/dL (ref 3.5–5.0)
Alkaline Phosphatase: 51 U/L (ref 38–126)
Anion gap: 9 (ref 5–15)
BUN: 13 mg/dL (ref 8–23)
CO2: 23 mmol/L (ref 22–32)
Calcium: 9 mg/dL (ref 8.9–10.3)
Chloride: 114 mmol/L — ABNORMAL HIGH (ref 98–111)
Creatinine, Ser: 0.81 mg/dL (ref 0.44–1.00)
GFR calc Af Amer: >60 mL/min (ref >60)
GFR calc non Af Amer: >60 mL/min (ref >60)
Glucose, Bld: 95 mg/dL (ref 70–99)
Potassium: 3.6 mmol/L (ref 3.5–5.1)
Sodium: 146 mmol/L — ABNORMAL HIGH (ref 135–145)
Total Bilirubin: 0.4 mg/dL (ref 0.3–1.2)
Total Protein: 6.6 g/dL (ref 6.5–8.1)

## 2018-04-20 LAB — CBC WITH DIFFERENTIAL/PLATELET
Basophils Absolute: 0.1 10*3/uL (ref 0.0–0.1)
Basophils Relative: 2 %
Eosinophils Absolute: 0.1 10*3/uL (ref 0.0–0.7)
Eosinophils Relative: 1 %
HCT: 40.2 % (ref 36.0–46.0)
Hemoglobin: 13.1 g/dL (ref 12.0–15.0)
Lymphocytes Relative: 40 %
Lymphs Abs: 2 10*3/uL (ref 0.7–4.0)
MCH: 29.9 pg (ref 26.0–34.0)
MCHC: 32.6 g/dL (ref 30.0–36.0)
MCV: 91.8 fL (ref 78.0–100.0)
Monocytes Absolute: 0.4 10*3/uL (ref 0.1–1.0)
Monocytes Relative: 9 %
Neutro Abs: 2.4 10*3/uL (ref 1.7–7.7)
Neutrophils Relative %: 48 %
Platelets: 367 10*3/uL (ref 150–400)
RBC: 4.38 MIL/uL (ref 3.87–5.11)
RDW: 14.3 % (ref 11.5–15.5)
WBC: 5 10*3/uL (ref 4.0–10.5)

## 2018-04-23 MED ORDER — BUPIVACAINE LIPOSOME 1.3 % IJ SUSP
20.0000 mL | INTRAMUSCULAR | Status: DC
  Filled 2018-04-23: qty 20

## 2018-04-23 NOTE — Anesthesia Preprocedure Evaluation (Addendum)
Anesthesia Evaluation  Patient identified by MRN, date of birth, ID band Patient awake    Reviewed: Allergy & Precautions, NPO status , Patient's Chart, lab work & pertinent test results  Airway Mallampati: II  TM Distance: >3 FB Neck ROM: Full    Dental no notable dental hx. (+) Teeth Intact, Dental Advisory Given   Pulmonary neg pulmonary ROS,    Pulmonary exam normal breath sounds clear to auscultation       Cardiovascular negative cardio ROS Normal cardiovascular exam Rhythm:Regular Rate:Normal     Neuro/Psych  Headaches, Seizures -,     GI/Hepatic Neg liver ROS, GERD  ,  Endo/Other  negative endocrine ROS  Renal/GU negative Renal ROS     Musculoskeletal  (+) Arthritis ,   Abdominal   Peds  Hematology negative hematology ROS (+)   Anesthesia Other Findings   Reproductive/Obstetrics                            Anesthesia Physical Anesthesia Plan  ASA: III  Anesthesia Plan: MAC   Post-op Pain Management:    Induction: Intravenous  PONV Risk Score and Plan: 4 or greater and Ondansetron, Dexamethasone and Treatment may vary due to age or medical condition  Airway Management Planned: Oral ETT  Additional Equipment:   Intra-op Plan:   Post-operative Plan: Extubation in OR  Informed Consent: I have reviewed the patients History and Physical, chart, labs and discussed the procedure including the risks, benefits and alternatives for the proposed anesthesia with the patient or authorized representative who has indicated his/her understanding and acceptance.   Dental advisory given  Plan Discussed with: CRNA  Anesthesia Plan Comments:         Anesthesia Quick Evaluation

## 2018-04-24 ENCOUNTER — Encounter (HOSPITAL_COMMUNITY): Payer: Self-pay

## 2018-04-24 ENCOUNTER — Encounter (HOSPITAL_COMMUNITY)
Admission: RE | Disposition: A | Discharge status: Home / Self Care | Payer: Self-pay | Source: Ambulatory Visit | Attending: Surgery

## 2018-04-24 ENCOUNTER — Ambulatory Visit (HOSPITAL_COMMUNITY)
Admission: RE | Admit: 2018-04-24 | Discharge: 2018-04-24 | Disposition: A | Discharge status: Home / Self Care | Payer: Medicare Other | Source: Ambulatory Visit | Attending: Surgery | Admitting: Surgery

## 2018-04-24 ENCOUNTER — Ambulatory Visit (HOSPITAL_COMMUNITY): Payer: Medicare Other | Admitting: Anesthesiology

## 2018-04-24 DIAGNOSIS — F329 Major depressive disorder, single episode, unspecified: Secondary | ICD-10-CM | POA: Diagnosis not present

## 2018-04-24 DIAGNOSIS — K8064 Calculus of gallbladder and bile duct with chronic cholecystitis without obstruction: Secondary | ICD-10-CM | POA: Insufficient documentation

## 2018-04-24 DIAGNOSIS — Z8719 Personal history of other diseases of the digestive system: Secondary | ICD-10-CM | POA: Insufficient documentation

## 2018-04-24 DIAGNOSIS — K219 Gastro-esophageal reflux disease without esophagitis: Secondary | ICD-10-CM | POA: Diagnosis not present

## 2018-04-24 DIAGNOSIS — G43009 Migraine without aura, not intractable, without status migrainosus: Secondary | ICD-10-CM | POA: Diagnosis not present

## 2018-04-24 DIAGNOSIS — Z8249 Family history of ischemic heart disease and other diseases of the circulatory system: Secondary | ICD-10-CM | POA: Diagnosis not present

## 2018-04-24 DIAGNOSIS — Z888 Allergy status to other drugs, medicaments and biological substances status: Secondary | ICD-10-CM | POA: Diagnosis not present

## 2018-04-24 DIAGNOSIS — Z79899 Other long term (current) drug therapy: Secondary | ICD-10-CM | POA: Insufficient documentation

## 2018-04-24 DIAGNOSIS — Z7982 Long term (current) use of aspirin: Secondary | ICD-10-CM | POA: Diagnosis not present

## 2018-04-24 DIAGNOSIS — M1990 Unspecified osteoarthritis, unspecified site: Secondary | ICD-10-CM | POA: Diagnosis not present

## 2018-04-24 DIAGNOSIS — M1712 Unilateral primary osteoarthritis, left knee: Secondary | ICD-10-CM | POA: Diagnosis not present

## 2018-04-24 DIAGNOSIS — K8050 Calculus of bile duct without cholangitis or cholecystitis without obstruction: Secondary | ICD-10-CM | POA: Diagnosis present

## 2018-04-24 DIAGNOSIS — K8020 Calculus of gallbladder without cholecystitis without obstruction: Secondary | ICD-10-CM | POA: Diagnosis not present

## 2018-04-24 DIAGNOSIS — K8010 Calculus of gallbladder with chronic cholecystitis without obstruction: Secondary | ICD-10-CM | POA: Diagnosis not present

## 2018-04-24 HISTORY — PX: CHOLECYSTECTOMY: SHX55

## 2018-04-24 SURGERY — LAPAROSCOPIC CHOLECYSTECTOMY
Anesthesia: Monitor Anesthesia Care

## 2018-04-24 MED ORDER — CELECOXIB 200 MG PO CAPS
200.0000 mg | ORAL_CAPSULE | ORAL | Status: AC
  Administered 2018-04-24: 200 mg via ORAL
  Filled 2018-04-24: qty 1

## 2018-04-24 MED ORDER — ACETAMINOPHEN 500 MG PO TABS: 1000.0000 mg | ORAL_TABLET | ORAL | Status: DC

## 2018-04-24 MED ORDER — 0.9 % SODIUM CHLORIDE (POUR BTL) OPTIME
TOPICAL | Status: DC | PRN
  Administered 2018-04-24: 1000 mL

## 2018-04-24 MED ORDER — ROCURONIUM BROMIDE 10 MG/ML (PF) SYRINGE
PREFILLED_SYRINGE | INTRAVENOUS | Status: AC
  Filled 2018-04-24: qty 10

## 2018-04-24 MED ORDER — OXYCODONE-ACETAMINOPHEN 5-325 MG PO TABS: 1.0000 | ORAL_TABLET | Freq: Four times a day (QID) | ORAL | 0 refills | Status: DC | PRN

## 2018-04-24 MED ORDER — BUPIVACAINE-EPINEPHRINE (PF) 0.25% -1:200000 IJ SOLN
INTRAMUSCULAR | Status: AC
  Filled 2018-04-24: qty 30

## 2018-04-24 MED ORDER — EPHEDRINE SULFATE-NACL 50-0.9 MG/10ML-% IV SOSY
PREFILLED_SYRINGE | INTRAVENOUS | Status: DC | PRN
  Administered 2018-04-24 (×3): 10 mg via INTRAVENOUS

## 2018-04-24 MED ORDER — SUGAMMADEX SODIUM 200 MG/2ML IV SOLN
INTRAVENOUS | Status: AC
  Filled 2018-04-24: qty 2

## 2018-04-24 MED ORDER — SUCCINYLCHOLINE CHLORIDE 200 MG/10ML IV SOSY
PREFILLED_SYRINGE | INTRAVENOUS | Status: DC | PRN
  Administered 2018-04-24: 100 mg via INTRAVENOUS

## 2018-04-24 MED ORDER — LIDOCAINE 2% (20 MG/ML) 5 ML SYRINGE
INTRAMUSCULAR | Status: AC
  Filled 2018-04-24: qty 5

## 2018-04-24 MED ORDER — PROPOFOL 10 MG/ML IV BOLUS
INTRAVENOUS | Status: DC | PRN
  Administered 2018-04-24: 100 mg via INTRAVENOUS

## 2018-04-24 MED ORDER — MIDAZOLAM HCL 2 MG/2ML IJ SOLN
INTRAMUSCULAR | Status: AC
  Filled 2018-04-24: qty 2

## 2018-04-24 MED ORDER — KETAMINE HCL 10 MG/ML IJ SOLN
INTRAMUSCULAR | Status: AC
  Filled 2018-04-24: qty 1

## 2018-04-24 MED ORDER — CEFAZOLIN SODIUM-DEXTROSE 2-4 GM/100ML-% IV SOLN
2.0000 g | INTRAVENOUS | Status: AC
  Administered 2018-04-24: 2 g via INTRAVENOUS
  Filled 2018-04-24: qty 100

## 2018-04-24 MED ORDER — GABAPENTIN 300 MG PO CAPS: 300.0000 mg | ORAL_CAPSULE | Freq: Once | ORAL | Status: DC

## 2018-04-24 MED ORDER — HYDROMORPHONE HCL 1 MG/ML IJ SOLN
INTRAMUSCULAR | Status: DC | PRN
  Administered 2018-04-24 (×2): 0.5 mg via INTRAVENOUS

## 2018-04-24 MED ORDER — MIDAZOLAM HCL 5 MG/5ML IJ SOLN
INTRAMUSCULAR | Status: DC | PRN
  Administered 2018-04-24 (×2): 0.5 mg via INTRAVENOUS

## 2018-04-24 MED ORDER — ACETAMINOPHEN 500 MG PO TABS
1000.0000 mg | ORAL_TABLET | Freq: Once | ORAL | Status: AC
  Administered 2018-04-24: 1000 mg via ORAL
  Filled 2018-04-24: qty 2

## 2018-04-24 MED ORDER — HYDROMORPHONE HCL 2 MG/ML IJ SOLN
INTRAMUSCULAR | Status: AC
  Filled 2018-04-24: qty 1

## 2018-04-24 MED ORDER — LACTATED RINGERS IR SOLN
Status: DC | PRN
  Administered 2018-04-24: 1000 mL

## 2018-04-24 MED ORDER — GABAPENTIN 300 MG PO CAPS
300.0000 mg | ORAL_CAPSULE | ORAL | Status: AC
  Administered 2018-04-24: 300 mg via ORAL
  Filled 2018-04-24: qty 1

## 2018-04-24 MED ORDER — SUGAMMADEX SODIUM 200 MG/2ML IV SOLN
INTRAVENOUS | Status: DC | PRN
  Administered 2018-04-24: 200 mg via INTRAVENOUS

## 2018-04-24 MED ORDER — PROPOFOL 10 MG/ML IV BOLUS
INTRAVENOUS | Status: AC
  Filled 2018-04-24: qty 20

## 2018-04-24 MED ORDER — SUCCINYLCHOLINE CHLORIDE 200 MG/10ML IV SOSY
PREFILLED_SYRINGE | INTRAVENOUS | Status: AC
  Filled 2018-04-24: qty 10

## 2018-04-24 MED ORDER — BUPIVACAINE-EPINEPHRINE 0.25% -1:200000 IJ SOLN
INTRAMUSCULAR | Status: DC | PRN
  Administered 2018-04-24: 30 mL

## 2018-04-24 MED ORDER — EPHEDRINE 5 MG/ML INJ
INTRAVENOUS | Status: AC
  Filled 2018-04-24: qty 10

## 2018-04-24 MED ORDER — DEXAMETHASONE SODIUM PHOSPHATE 10 MG/ML IJ SOLN
INTRAMUSCULAR | Status: DC | PRN
  Administered 2018-04-24: 6 mg via INTRAVENOUS

## 2018-04-24 MED ORDER — ONDANSETRON HCL 4 MG/2ML IJ SOLN
INTRAMUSCULAR | Status: DC | PRN
  Administered 2018-04-24: 4 mg via INTRAVENOUS

## 2018-04-24 MED ORDER — ROCURONIUM BROMIDE 50 MG/5ML IV SOSY
PREFILLED_SYRINGE | INTRAVENOUS | Status: DC | PRN
  Administered 2018-04-24: 10 mg via INTRAVENOUS
  Administered 2018-04-24: 30 mg via INTRAVENOUS

## 2018-04-24 MED ORDER — CHLORHEXIDINE GLUCONATE 4 % EX LIQD: 60.0000 mL | Freq: Once | CUTANEOUS | Status: DC

## 2018-04-24 MED ORDER — LACTATED RINGERS IV SOLN
INTRAVENOUS | Status: DC
  Administered 2018-04-24: 06:00:00 via INTRAVENOUS

## 2018-04-24 MED ORDER — DEXAMETHASONE SODIUM PHOSPHATE 10 MG/ML IJ SOLN
INTRAMUSCULAR | Status: AC
Start: 2018-04-24 — End: ?
  Filled 2018-04-24: qty 1

## 2018-04-24 MED ORDER — DOCUSATE SODIUM 100 MG PO CAPS: 100.0000 mg | ORAL_CAPSULE | Freq: Two times a day (BID) | ORAL | 0 refills | Status: AC

## 2018-04-24 MED ORDER — CLONIDINE HCL 0.2 MG PO TABS
0.2000 mg | ORAL_TABLET | Freq: Once | ORAL | Status: AC
  Administered 2018-04-24: 0.2 mg via ORAL
  Filled 2018-04-24: qty 1

## 2018-04-24 MED ORDER — LIDOCAINE 2% (20 MG/ML) 5 ML SYRINGE
INTRAMUSCULAR | Status: DC | PRN
  Administered 2018-04-24: 80 mg via INTRAVENOUS

## 2018-04-24 MED ORDER — ONDANSETRON HCL 4 MG/2ML IJ SOLN
INTRAMUSCULAR | Status: AC
  Filled 2018-04-24: qty 2

## 2018-04-24 SURGICAL SUPPLY — 28 items
APPLIER CLIP ROT 10 11.4 M/L (STAPLE) ×2
CABLE HIGH FREQUENCY MONO STRZ (ELECTRODE) ×2 IMPLANT
CHLORAPREP W/TINT 26ML (MISCELLANEOUS) ×2 IMPLANT
CLIP APPLIE ROT 10 11.4 M/L (STAPLE) ×1 IMPLANT
COVER MAYO STAND STRL (DRAPES) IMPLANT
COVER SURGICAL LIGHT HANDLE (MISCELLANEOUS) ×2 IMPLANT
DECANTER SPIKE VIAL GLASS SM (MISCELLANEOUS) ×2 IMPLANT
DERMABOND ADVANCED (GAUZE/BANDAGES/DRESSINGS) ×1
DERMABOND ADVANCED .7 DNX12 (GAUZE/BANDAGES/DRESSINGS) ×1 IMPLANT
ELECT REM PT RETURN 15FT ADLT (MISCELLANEOUS) ×2 IMPLANT
GLOVE BIO SURGEON STRL SZ 6 (GLOVE) ×2 IMPLANT
GLOVE INDICATOR 6.5 STRL GRN (GLOVE) ×2 IMPLANT
GOWN STRL REUS W/TWL LRG LVL3 (GOWN DISPOSABLE) ×4 IMPLANT
GOWN STRL REUS W/TWL XL LVL3 (GOWN DISPOSABLE) IMPLANT
GRASPER SUT TROCAR 14GX15 (MISCELLANEOUS) ×2 IMPLANT
HEMOSTAT SNOW SURGICEL 2X4 (HEMOSTASIS) IMPLANT
KIT BASIN OR (CUSTOM PROCEDURE TRAY) ×2 IMPLANT
NEEDLE INSUFFLATION 14GA 120MM (NEEDLE) ×2 IMPLANT
POUCH SPECIMEN RETRIEVAL 10MM (ENDOMECHANICALS) ×2 IMPLANT
SCISSORS LAP 5X35 DISP (ENDOMECHANICALS) ×2 IMPLANT
SET IRRIG TUBING LAPAROSCOPIC (IRRIGATION / IRRIGATOR) ×2 IMPLANT
SLEEVE XCEL OPT CAN 5 100 (ENDOMECHANICALS) ×4 IMPLANT
SUT MNCRL AB 4-0 PS2 18 (SUTURE) ×2 IMPLANT
TOWEL OR 17X26 10 PK STRL BLUE (TOWEL DISPOSABLE) ×2 IMPLANT
TRAY LAPAROSCOPIC (CUSTOM PROCEDURE TRAY) ×2 IMPLANT
TROCAR BLADELESS OPT 5 100 (ENDOMECHANICALS) ×2 IMPLANT
TROCAR XCEL 12X100 BLDLESS (ENDOMECHANICALS) ×2 IMPLANT
TUBING INSUF HEATED (TUBING) ×2 IMPLANT

## 2018-04-24 NOTE — Interval H&P Note (Signed)
History and Physical Interval Note:  04/24/2018 7:04 AM  Lindsay Frost  has presented today for surgery, with the diagnosis of Biliary colic  The various methods of treatment have been discussed with the patient and family. After consideration of risks, benefits and other options for treatment, the patient has consented to  Procedure(s): LAPAROSCOPIC CHOLECYSTECTOMY (N/A) as a surgical intervention .  The patient's history has been reviewed, patient examined, no change in status, stable for surgery.  I have reviewed the patient's chart and labs.  Questions were answered to the patient's satisfaction.     Faithlyn Recktenwald Rich Brave

## 2018-04-24 NOTE — Op Note (Addendum)
Operative Note  Lindsay Frost 62 y.o. female [id number]  04/24/2018  Surgeon: Clovis Riley MD  Assistant: none  Procedure performed: Laparoscopic Cholecystectomy  Preop diagnosis: biliary colic Post-op diagnosis/intraop findings: same  Specimens: gallbladder  EBL: minimal  Complications: none  Description of procedure: After obtaining informed consent the patient was brought to the operating room. Prophylactic antibiotics were administered. SCD's were applied. General endotracheal anesthesia was initiated and a formal time-out was performed. The abdomen was prepped and draped in the usual sterile fashion and the abdomen was entered using an infraumbilical veress needle after instilling the site with local. Insufflation to 37mmHg was obtained and gross inspection revealed no evidence of injury from our entry or other intraabdominal abnormalities. Two 68mm trocars were introduced in the right midclavicular and right anterior axillary lines under direct visualization and following infiltration with local. An 13mm trocar was placed in the epigastrium. The gallbladder was retracted cephalad and the infundibulum was retracted laterally. A combination of hook electrocautery and blunt dissection was utilized to clear the peritoneum from the neck and cystic duct, circumferentially isolating the cystic artery and cystic duct and lifting the gallbladder from the cystic plate. The critical view of safety was achieved with the cystic artery, cystic duct, and liver bed visualized between them with no other structures. The common bile duct was also easily visible and protected. The artery was clipped with two clips proximally and one distally and divided as was the cystic duct with three clips on the proximal end. The gallbladder was dissected from the liver plate using electrocautery. There was a tiny tubular structure entering the midbody of the gallbladder possibly a duct of Luschka which tore as I was  retracting the gallbladder; I did place a clip on this proximally although no bile or bleeding was noted from the end. Once freed the gallbladder was placed in an endocatch bag and removed intact through the epigastric trocar site. A small amount of bleeding on the liver bed was controlled with cautery. The right upper quadrant was irrigated and aspirated and the effluent was clear. Hemostasis was once again confirmed, and reinspection of the abdomen revealed no injuries. There was a band of thin omental adhesions just to the patients left of the umbilicus. The umbilical trocar was next to but did not penetrate these adhesions. I did take these adhesions down sharply and with cautery. The underlying bowel was inspected and confirmed free of injury. The liver bed was reinspected; the clips were well opposed without any bile leak from the duct or the liver bed. The 54mm trocar site in the epigastrium was closed with two 0 vicryls in the fascia under direct visualization using a PMI device. The abdomen was desufflated and all trocars removed. The skin incisions were closed with running subcuticular monocryl and Dermabond. The patient was awakened, extubated and transported to the recovery room in stable condition.   All counts were correct at the completion of the case.

## 2018-04-24 NOTE — Transfer of Care (Signed)
Immediate Anesthesia Transfer of Care Note  Patient: Lindsay Frost  Procedure(s) Performed: LAPAROSCOPIC CHOLECYSTECTOMY (N/A )  Patient Location: PACU  Anesthesia Type:General  Level of Consciousness: awake, alert , oriented and patient cooperative  Airway & Oxygen Therapy: Patient Spontanous Breathing and Patient connected to face mask  Post-op Assessment: Report given to RN and Post -op Vital signs reviewed and stable  Post vital signs: Reviewed and stable  Last Vitals:  Vitals Value Taken Time  BP 125/61 04/24/2018  8:47 AM  Temp    Pulse 81 04/24/2018  8:48 AM  Resp 16 04/24/2018  8:48 AM  SpO2 100 % 04/24/2018  8:48 AM  Vitals shown include unvalidated device data.  Last Pain:  Vitals:   04/24/18 0606  TempSrc:   PainSc: 0-No pain         Complications: No apparent anesthesia complications

## 2018-04-24 NOTE — Discharge Instructions (Signed)
LAPAROSCOPIC SURGERY: POST OP INSTRUCTIONS ° °###################################################################### ° °EAT °Gradually transition to a high fiber diet with a fiber supplement over the next few weeks after discharge.  Start with a pureed / full liquid diet (see below) ° °WALK °Walk an hour a day.  Control your pain to do that.   ° °CONTROL PAIN °Control pain so that you can walk, sleep, tolerate sneezing/coughing, go up/down stairs. ° °HAVE A BOWEL MOVEMENT DAILY °Keep your bowels regular to avoid problems.  OK to try a laxative to override constipation.  OK to use an antidairrheal to slow down diarrhea.  Call if not better after 2 tries ° °CALL IF YOU HAVE PROBLEMS/CONCERNS °Call if you are still struggling despite following these instructions. °Call if you have concerns not answered by these instructions ° °###################################################################### ° ° ° °1. DIET: Follow a light bland diet the first 24 hours after arrival home, such as soup, liquids, crackers, etc.  Be sure to include lots of fluids daily.  Avoid fast food or heavy meals as your are more likely to get nauseated.  Eat a low fat the next few days after surgery.   °2. Take your usually prescribed home medications unless otherwise directed. °3. PAIN CONTROL: °a. Pain is best controlled by a usual combination of three different methods TOGETHER: °i. Ice/Heat °ii. Over the counter pain medication °iii. Prescription pain medication °b. Most patients will experience some swelling and bruising around the incisions.  Ice packs or heating pads (30-60 minutes up to 6 times a day) will help. Use ice for the first few days to help decrease swelling and bruising, then switch to heat to help relax tight/sore spots and speed recovery.  Some people prefer to use ice alone, heat alone, alternating between ice & heat.  Experiment to what works for you.  Swelling and bruising can take several weeks to resolve.   °c. It is  helpful to take an over-the-counter pain medication regularly for the first few weeks.  Choose one of the following that works best for you: °i. Naproxen (Aleve, etc)  Two 220mg tabs twice a day °ii. Ibuprofen (Advil, etc) Three 200mg tabs four times a day (every meal & bedtime) °iii. Acetaminophen (Tylenol, etc) 500-650mg four times a day (every meal & bedtime) °d. A  prescription for pain medication (such as oxycodone, hydrocodone, etc) should be given to you upon discharge.  Take your pain medication as prescribed.  °i. If you are having problems/concerns with the prescription medicine (does not control pain, nausea, vomiting, rash, itching, etc), please call us [phone] to see if we need to switch you to a different pain medicine that will work better for you and/or control your side effect better. °ii. If you need a refill on your pain medication, please contact your pharmacy.  They will contact our office to request authorization. Prescriptions will not be filled after 5 pm or on week-ends. °4. Avoid getting constipated.  Between the surgery and the pain medications, it is common to experience some constipation.  Increasing fluid intake and taking a fiber supplement (such as Metamucil, Citrucel, FiberCon, MiraLax, etc) 1-2 times a day regularly will usually help prevent this problem from occurring.  A mild laxative (prune juice, Milk of Magnesia, MiraLax, etc) should be taken according to package directions if there are no bowel movements after 48 hours.   °5. Watch out for diarrhea.  If you have many loose bowel movements, simplify your diet to bland foods & liquids for   a few days.  Stop any stool softeners and decrease your fiber supplement.  Switching to mild anti-diarrheal medications (Kayopectate, Pepto Bismol) can help.  If this worsens or does not improve, please call us. °6. Wash / shower every day.  You may shower over the skin glue which is waterproof.  Continue to shower over incision(s) after  the dressing is off. °7. Skin glue will flake off after about 2 weeks.  You may leave the incision open to air.  You may replace a dressing/Band-Aid to cover the incision for comfort if you wish.  °8. ACTIVITIES as tolerated:   °a. You may resume regular (light) daily activities beginning the next day--such as daily self-care, walking, climbing stairs--gradually increasing activities as tolerated.  If you can walk 30 minutes without difficulty, it is safe to try more intense activity such as jogging, treadmill, bicycling, low-impact aerobics, swimming, etc. °b. Save the most intensive and strenuous activity for last such as sit-ups, heavy lifting, contact sports, etc  Refrain from any heavy lifting or straining until you are off narcotics for pain control.   °c. DO NOT PUSH THROUGH PAIN.  Let pain be your guide: If it hurts to do something, don't do it.  Pain is your body warning you to avoid that activity for another week until the pain goes down. °d. You may drive when you are no longer taking prescription pain medication, you can comfortably wear a seatbelt, and you can safely maneuver your car and apply brakes. °e. You may have sexual intercourse when it is comfortable.  °9. FOLLOW UP in our office °a. Please call [facility] at [phone] to set up an appointment to see your surgeon in the office for a follow-up appointment approximately 2-3 weeks after your surgery. °b. Make sure that you call for this appointment the day you arrive home to insure a convenient appointment time. °10. IF YOU HAVE DISABILITY OR FAMILY LEAVE FORMS, BRING THEM TO THE OFFICE FOR PROCESSING.  DO NOT GIVE THEM TO YOUR DOCTOR. ° ° °WHEN TO CALL US [phone]: °1. Poor pain control °2. Reactions / problems with new medications (rash/itching, nausea, etc)  °3. Fever over 101.5 F (38.5 C) °4. Inability to urinate °5. Nausea and/or vomiting °6. Worsening swelling or bruising °7. Continued bleeding from incision. °8. Increased pain,  redness, or drainage from the incision ° ° The clinic staff is available to answer your questions during regular business hours (8:30am-5pm).  Please don’t hesitate to call and ask to speak to one of our nurses for clinical concerns.  ° If you have a medical emergency, go to the nearest emergency room or call 911. ° A surgeon from [facility] is always on call at the hospitals ° ° °[facility] °[address], [address], [place], [place]  [address] ? °MAIN: [phone] ? TOLL FREE: [phone] ?  °FAX [phone] °[email or web address] ° ° °

## 2018-04-24 NOTE — Anesthesia Postprocedure Evaluation (Signed)
Anesthesia Post Note  Patient: Lindsay Frost  Procedure(s) Performed: LAPAROSCOPIC CHOLECYSTECTOMY (N/A )     Patient location during evaluation: PACU Anesthesia Type: MAC Level of consciousness: awake and alert Pain management: pain level controlled Vital Signs Assessment: post-procedure vital signs reviewed and stable Respiratory status: spontaneous breathing, nonlabored ventilation, respiratory function stable and patient connected to nasal cannula oxygen Cardiovascular status: stable and blood pressure returned to baseline Postop Assessment: no apparent nausea or vomiting Anesthetic complications: no    Last Vitals:  Vitals:   04/24/18 0930 04/24/18 0945  BP: (!) 97/57 (!) 94/59  Pulse: 71 74  Resp: (!) 24 15  Temp:    SpO2: 95% 96%    Last Pain:  Vitals:   04/24/18 0945  TempSrc:   PainSc: [place] A Shirley Bolle

## 2018-04-24 NOTE — Anesthesia Procedure Notes (Signed)
Procedure Name: Intubation Date/Time: 04/24/2018 7:43 AM Performed by: Maxwell Caul, CRNA Pre-anesthesia Checklist: Patient identified, Emergency Drugs available, Suction available and Patient being monitored Patient Re-evaluated:Patient Re-evaluated prior to induction Oxygen Delivery Method: Circle system utilized Preoxygenation: Pre-oxygenation with 100% oxygen Induction Type: IV induction Ventilation: Mask ventilation without difficulty Laryngoscope Size: Mac and 3 Grade View: Grade I Tube type: Oral Tube size: 7.0 mm Number of attempts: 1 Airway Equipment and Method: Stylet Placement Confirmation: ETT inserted through vocal cords under direct vision,  positive ETCO2 and breath sounds checked- equal and bilateral Secured at: 21 cm Tube secured with: Tape Dental Injury: Teeth and Oropharynx as per pre-operative assessment

## 2018-04-25 ENCOUNTER — Encounter (HOSPITAL_COMMUNITY): Payer: Self-pay | Admitting: Surgery

## 2018-04-25 ENCOUNTER — Other Ambulatory Visit: Payer: Self-pay | Admitting: Neurology

## 2018-04-27 NOTE — Addendum Note (Signed)
Addendum  created 04/27/18 1246 by Barnet Glasgow, MD   Sign clinical note

## 2018-04-27 NOTE — Anesthesia Postprocedure Evaluation (Signed)
Anesthesia Post Note  Patient: Lindsay Frost  Procedure(s) Performed: LAPAROSCOPIC CHOLECYSTECTOMY (N/A )     Patient location during evaluation: PACU Anesthesia Type: General Level of consciousness: awake and alert Pain management: pain level controlled Vital Signs Assessment: post-procedure vital signs reviewed and stable Respiratory status: spontaneous breathing, nonlabored ventilation, respiratory function stable and patient connected to nasal cannula oxygen Cardiovascular status: blood pressure returned to baseline and stable Postop Assessment: no apparent nausea or vomiting Anesthetic complications: no    Last Vitals:  Vitals:   04/24/18 1022 04/24/18 1110  BP: (!) 93/55 (!) 88/57  Pulse: 74 76  Resp: 12 12  Temp:  36.5 C  SpO2:  96%    Last Pain:  Vitals:   04/27/18 1054  TempSrc:   PainSc: 3                  Barnet Glasgow

## 2018-05-29 DIAGNOSIS — K8050 Calculus of bile duct without cholangitis or cholecystitis without obstruction: Secondary | ICD-10-CM | POA: Diagnosis not present

## 2018-06-03 DIAGNOSIS — R1012 Left upper quadrant pain: Secondary | ICD-10-CM | POA: Diagnosis not present

## 2018-06-03 DIAGNOSIS — R1013 Epigastric pain: Secondary | ICD-10-CM | POA: Diagnosis not present

## 2018-06-19 ENCOUNTER — Ambulatory Visit (INDEPENDENT_AMBULATORY_CARE_PROVIDER_SITE_OTHER): Payer: Medicare Other | Admitting: Internal Medicine

## 2018-06-19 ENCOUNTER — Ambulatory Visit (HOSPITAL_BASED_OUTPATIENT_CLINIC_OR_DEPARTMENT_OTHER)
Admission: RE | Admit: 2018-06-19 | Discharge: 2018-06-19 | Disposition: A | Discharge status: Home / Self Care | Payer: Medicare Other | Source: Ambulatory Visit | Attending: Internal Medicine | Admitting: Internal Medicine

## 2018-06-19 ENCOUNTER — Encounter: Payer: Self-pay | Admitting: Internal Medicine

## 2018-06-19 VITALS — BP 124/66 | HR 79 | Temp 98.0°F | Resp 16 | Ht 64.0 in | Wt 136.5 lb

## 2018-06-19 DIAGNOSIS — Z6823 Body mass index (BMI) 23.0-23.9, adult: Secondary | ICD-10-CM | POA: Insufficient documentation

## 2018-06-19 DIAGNOSIS — R634 Abnormal weight loss: Secondary | ICD-10-CM | POA: Diagnosis present

## 2018-06-19 DIAGNOSIS — K279 Peptic ulcer, site unspecified, unspecified as acute or chronic, without hemorrhage or perforation: Secondary | ICD-10-CM | POA: Diagnosis not present

## 2018-06-19 DIAGNOSIS — M546 Pain in thoracic spine: Secondary | ICD-10-CM | POA: Diagnosis not present

## 2018-06-19 LAB — CBC WITH DIFFERENTIAL/PLATELET
Basophils Absolute: 89 cells/uL (ref 0–200)
Basophils Relative: 1.5 %
Eosinophils Absolute: 71 cells/uL (ref 15–500)
Eosinophils Relative: 1.2 %
HCT: 38 % (ref 35.0–45.0)
Hemoglobin: 12.6 g/dL (ref 11.7–15.5)
Lymphs Abs: 1923 cells/uL (ref 850–3900)
MCH: 29.5 pg (ref 27.0–33.0)
MCHC: 33.2 g/dL (ref 32.0–36.0)
MCV: 89 fL (ref 80.0–100.0)
MPV: 11.4 fL (ref 7.5–12.5)
Monocytes Relative: 8.7 %
Neutro Abs: 3304 cells/uL (ref 1500–7800)
Neutrophils Relative %: 56 %
Platelets: 350 10*3/uL (ref 140–400)
RBC: 4.27 10*6/uL (ref 3.80–5.10)
RDW: 12.9 % (ref 11.0–15.0)
Total Lymphocyte: 32.6 %
WBC mixed population: 513 cells/uL (ref 200–950)
WBC: 5.9 10*3/uL (ref 3.8–10.8)

## 2018-06-19 LAB — COMPREHENSIVE METABOLIC PANEL
AG Ratio: 1.8 (calc) (ref 1.0–2.5)
ALT: 6 U/L (ref 6–29)
AST: 13 U/L (ref 10–35)
Albumin: 4.2 g/dL (ref 3.6–5.1)
Alkaline phosphatase (APISO): 53 U/L (ref 33–130)
BUN: 14 mg/dL (ref 7–25)
CO2: 21 mmol/L (ref 20–32)
Calcium: 9.4 mg/dL (ref 8.6–10.4)
Chloride: 108 mmol/L (ref 98–110)
Creat: 0.96 mg/dL (ref 0.50–0.99)
Globulin: 2.3 g/dL (calc) (ref 1.9–3.7)
Glucose, Bld: 81 mg/dL (ref 65–99)
Potassium: 3.5 mmol/L (ref 3.5–5.3)
Sodium: 141 mmol/L (ref 135–146)
Total Bilirubin: 0.2 mg/dL (ref 0.2–1.2)
Total Protein: 6.5 g/dL (ref 6.1–8.1)

## 2018-06-19 LAB — TSH: TSH: 1.2 mIU/L (ref 0.40–4.50)

## 2018-06-19 MED ORDER — CYCLOBENZAPRINE HCL 10 MG PO TABS: 10.0000 mg | ORAL_TABLET | Freq: Every evening | ORAL | 0 refills | Status: DC | PRN

## 2018-06-19 MED ORDER — OMEPRAZOLE 40 MG PO CPDR: 40.0000 mg | DELAYED_RELEASE_CAPSULE | Freq: Every day | ORAL | 3 refills | Status: DC

## 2018-06-19 NOTE — Progress Notes (Signed)
Subjective:    Patient ID: Lindsay Frost, female    DOB: 07/23/1956, 62 y.o.   MRN: [id number]  DOS:  10/4/2019acute Type of visit - description : acute Interval history: Here for back pain, I also noticed weight loss.  Back pain is started approximately January 2019, located at the thoracic and lumbar spine but mostly around at the distal lumbar spine. She was seen acutely 12-2017 by one of my colleagues, was prescribed prednisone and a muscle relaxant with some improvement. The pain however continue, does not radiate, is almost  daily, even at night.  As far as weight loss, is not intentional, it is started approximately 1.5 to 2 years ago. She denies fever, chills. No myalgias per se, no puffiness of the hands or wrist.  She saw Dr. Benson Norway,  GI, a couple of weeks ago, was told she has ulcers and PUD.  She is currently taking sucralfate but  reports "I was told not told to take prilosec"   Review of Systems Admits to some nausea and vomiting but no diarrhea or blood in the stools.  No hematemesis   Past Medical History:  Diagnosis Date  . Arthritis   . Back pain    arthritis  . Depression    takes cymbalta daily  . GERD (gastroesophageal reflux disease)    takes Omeprazole daily  . Headache(784.0)    migraines  . Heart murmur   . History of gastric ulcer   . History of migraine    last one about 2wks ago-takes Imitrex prn  . Joint pain   . Joint swelling   . Meningioma ([place])   . Ulcer     Past Surgical History:  Procedure Laterality Date  . BALLOON DILATION  04/03/2012   Procedure: BALLOON DILATION;  Surgeon: Beryle Beams, MD;  Location: [facility];  Service: Endoscopy;  Laterality: N/A;  . CHOLECYSTECTOMY N/A 04/24/2018   Procedure: LAPAROSCOPIC CHOLECYSTECTOMY;  Surgeon: Clovis Riley, MD;  Location: [facility];  Service: General;  Laterality: N/A;  . COLONOSCOPY    . DIAGNOSTIC LAPAROSCOPY  58yrs ago  . ESOPHAGEAL MANOMETRY N/A 09/04/2015   Procedure:  ESOPHAGEAL MANOMETRY (EM);  Surgeon: Carol Ada, MD;  [facility]: [facility];  Service: Endoscopy;  Laterality: N/A;  . ESOPHAGOGASTRODUODENOSCOPY  04/03/2012   Procedure: ESOPHAGOGASTRODUODENOSCOPY (EGD);  Surgeon: Beryle Beams, MD;  Location: [facility];  Service: Endoscopy;  Laterality: N/A;  EGD w/ balloon dilation  . ESOPHAGOGASTRODUODENOSCOPY (EGD) WITH PROPOFOL N/A 02/18/2014   Procedure: ESOPHAGOGASTRODUODENOSCOPY (EGD) WITH PROPOFOL;  Surgeon: Beryle Beams, MD;  [facility];  Service: Endoscopy;  Laterality: N/A;  . ESOPHAGOGASTRODUODENOSCOPY (EGD) WITH PROPOFOL N/A 04/22/2014   Procedure: ESOPHAGOGASTRODUODENOSCOPY (EGD) WITH PROPOFOL;  Surgeon: Beryle Beams, MD;  [facility]: [facility];  Service: Endoscopy;  Laterality: N/A;  . NASAL SEPTUM SURGERY  09/2016  . [place] IMPEDANCE STUDY N/A 09/04/2015   Procedure: [place] IMPEDANCE STUDY;  Surgeon: Carol Ada, MD;  [facility];  Service: Endoscopy;  Laterality: N/A;  . REPLACEMENT TOTAL KNEE Right 91yrs ago  . TOTAL KNEE ARTHROPLASTY Left 11/30/2012   Procedure: LEFT TOTAL KNEE ARTHROPLASTY;  Surgeon: Kerin Salen, MD;  Location: [place];  Service: Orthopedics;  Laterality: Left;    Social History   Socioeconomic History  . Marital status: Single    Spouse name: Not on file  . Number of children: Not on file  . Years of education: Not on file  . Highest education level: Not on  file  Occupational History  . Not on file  Social Needs  . Financial resource strain: Not on file  . Food insecurity:    Worry: Not on file    Inability: Not on file  . Transportation needs:    Medical: Not on file    Non-medical: Not on file  Tobacco Use  . Smoking status: Never Smoker  . Smokeless tobacco: Never Used  Substance and Sexual Activity  . Alcohol use: No  . Drug use: No  . Sexual activity: Not Currently    Birth control/protection: Post-menopausal  Lifestyle  . Physical activity:    Days per week: Not on file     Minutes per session: Not on file  . Stress: Not on file  Relationships  . Social connections:    Talks on phone: Not on file    Gets together: Not on file    Attends religious service: Not on file    Active member of club or organization: Not on file    Attends meetings of clubs or organizations: Not on file    Relationship status: Not on file  . Intimate partner violence:    Fear of current or ex partner: Not on file    Emotionally abused: Not on file    Physically abused: Not on file    Forced sexual activity: Not on file  Other Topics Concern  . 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 Always use your most recent med list.          BC HEADACHE PO Take 1 packet by mouth daily as needed (headaches).   cyclobenzaprine 10 MG tablet Commonly known as:  FLEXERIL Take 1 tablet (10 mg total) by mouth at bedtime as needed for muscle spasms.   eletriptan 40 MG tablet Commonly known as:  RELPAX TAKE 1 TABLET BY MOUTH AS NEEDED FOR MIGRAINE OF HEADACHE. MAY REPEAT IN 2 HOURS IF HEADACHE PERSISTS OR RECURS   escitalopram 20 MG tablet Commonly known as:  LEXAPRO TAKE 1 TABLET(20 MG) BY MOUTH DAILY   omeprazole 40 MG capsule Commonly known as:  PRILOSEC Take 1 capsule (40 mg total) by mouth daily.   oxyCODONE-acetaminophen 5-325 MG tablet Commonly known as:  PERCOCET/ROXICET Take 1 tablet by mouth every 6 (six) hours as needed for severe pain.   RESTASIS 0.05 % ophthalmic emulsion Generic drug:  cycloSPORINE Place 1 drop into both eyes 2 (two) times daily.   sucralfate 1 g tablet Commonly known as:  CARAFATE Take 1 g by mouth 4 (four) times daily.   topiramate 100 MG  tablet Commonly known as:  TOPAMAX TAKE 1 TABLET(100 MG) BY MOUTH TWICE DAILY          Objective:   Physical Exam BP 124/66 (BP Location: Left Arm, Patient Position: Sitting, Cuff Size: Small)   Pulse 79   Temp 98 F (36.7 C) (Oral)   Resp 16   Ht 5\' 4"  (1.626 m)   Wt 136 lb 8 oz (61.9 kg)   SpO2 98%   BMI 23.43 kg/m  General:   Well developed, NAD, see BMI.  HEENT:  Normocephalic . Face symmetric, atraumatic Neck: No thyromegaly Lungs:  CTA B Normal respiratory effort, no intercostal retractions, no accessory muscle use. Heart: RRR,  no murmur.  no pretibial edema bilaterally  Abdomen:  Not distended, soft, non-tender. No rebound or rigidity.  Palpable, nontender aorta noted. MSK: Hands and wrists with no synovitis Skin: at both sides of the distal thoracic spine, she has some excoriations from the use of a heating pad. Neurologic:  alert & oriented X3.  Speech normal, gait appropriate for age and unassisted Psych--  Cognition and judgment appear intact.  Cooperative with normal attention span and concentration.  Behavior appropriate. No anxious or depressed appearing.     Assessment & Plan:   62 year old female, PMH includes laparoscopic cholecystectomy 04-2018, GERD, migraines, seizures, depression, Presents with:  Thoracic/lumbar back pain.  Started several months ago, seems to be persistent, she has burned her skin using a heating pad. She has a history of PUD, is taking meloxicam. Had a x-ray of the lumbar spine 09-2017, showed DJD, plan: Stop meloxicam, Flexeril as needed, Ortho referral Weight loss: I am concerned about her weight loss, will check a TSH, recheck electrolytes and CBC.  Also a chest x-ray. We will get records from GI . Asked patient to come back for reassessment by PCP next week.  Wt loss: related to starting Topamax about a year and a half ago? PUD: Stop meloxicam, restart PPIs. RTC next week PCP

## 2018-06-19 NOTE — Patient Instructions (Addendum)
GO TO THE LAB : Get the blood work     GO TO THE FRONT DESK Schedule your next appointment for a follow-up with Mrs. Lindsay Frost next week next   STOP BY THE FIRST FLOOR:  get the XR   Stop naproxen meloxicam  For pain: Tylenol  500 mg OTC 2 tabs a day every 8 hours as needed for pain Flexeril at bedtime, a muscle relaxant We are referring you to a orthopedic doctor  Continue sucralfate  Restart omeprazole

## 2018-06-19 NOTE — Progress Notes (Signed)
Pre visit review using our clinic review tool, if applicable. No additional management support is needed unless otherwise documented below in the visit note. 

## 2018-06-26 ENCOUNTER — Ambulatory Visit (INDEPENDENT_AMBULATORY_CARE_PROVIDER_SITE_OTHER): Payer: Medicare Other | Admitting: Family

## 2018-06-26 ENCOUNTER — Encounter: Payer: Self-pay | Admitting: Family

## 2018-06-26 VITALS — BP 134/84 | HR 75 | Temp 98.5°F | Resp 16 | Ht 64.0 in | Wt 139.4 lb

## 2018-06-26 DIAGNOSIS — F329 Major depressive disorder, single episode, unspecified: Secondary | ICD-10-CM

## 2018-06-26 DIAGNOSIS — R634 Abnormal weight loss: Secondary | ICD-10-CM

## 2018-06-26 DIAGNOSIS — F32A Depression, unspecified: Secondary | ICD-10-CM

## 2018-06-26 DIAGNOSIS — M549 Dorsalgia, unspecified: Secondary | ICD-10-CM | POA: Diagnosis not present

## 2018-06-26 DIAGNOSIS — Z23 Encounter for immunization: Secondary | ICD-10-CM | POA: Diagnosis not present

## 2018-06-26 MED ORDER — TRAMADOL HCL 50 MG PO TABS: 50.0000 mg | ORAL_TABLET | Freq: Three times a day (TID) | ORAL | 0 refills | Status: DC | PRN

## 2018-06-26 NOTE — Progress Notes (Signed)
Subjective:    Patient ID: Lindsay Frost, female    DOB: 11-Oct-1955, 62 y.o.   MRN: [id number]  HPI  Patient is a 62 yr old female who presents today with c/o back pain. She saw Dr. Larose Kells for back pain on 06/19/18.  Initial visit was 4/19 at which time she was treated with prednisone/muscle relaxer and referred to orthopedics.  She notes that she has been sleeping better with the muscle relaxer.  Reports she still is having significant daytime pain and is unable to take the muscle relaxer during the day due to associated sleepiness.    She also had experienced weight loss.  She thinks that she was not sleeping well due to her back pain.  She is also been placed on Topamax per neurology. Wt Readings from Last 3 Encounters:  06/26/18 139 lb 6.4 oz (63.2 kg)  06/19/18 136 lb 8 oz (61.9 kg)  04/24/18 145 lb 6.4 oz (66 kg)   Lab Results  Component Value Date   WBC 5.9 06/19/2018   HGB 12.6 06/19/2018   HCT 38.0 06/19/2018   MCV 89.0 06/19/2018   PLT 350 06/19/2018   Lab Results  Component Value Date   TSH 1.20 06/19/2018   Depression- reports that her mood is good and denies anxiety.   Review of Systems    see HPI  Past Medical History:  Diagnosis Date  . Arthritis   . Back pain    arthritis  . Depression    takes cymbalta daily  . GERD (gastroesophageal reflux disease)    takes Omeprazole daily  . Headache(784.0)    migraines  . Heart murmur   . History of gastric ulcer   . History of migraine    last one about 2wks ago-takes Imitrex prn  . Joint pain   . Joint swelling   . Meningioma ([place])   . Ulcer      Social History   Socioeconomic History  . Marital status: Single    Spouse name: Not on file  . Number of children: Not on file  . Years of education: Not on file  . Highest education level: Not on file  Occupational History  . Not on file  Social Needs  . Financial resource strain: Not on file  . Food insecurity:    Worry: Not on file    Inability: Not  on file  . Transportation needs:    Medical: Not on file    Non-medical: Not on file  Tobacco Use  . Smoking status: Never Smoker  . Smokeless tobacco: Never Used  Substance and Sexual Activity  . Alcohol use: No  . Drug use: No  . Sexual activity: Not Currently    Birth control/protection: Post-menopausal  Lifestyle  . Physical activity:    Days per week: Not on file    Minutes per session: Not on file  . Stress: Not on file  Relationships  . Social connections:    Talks on phone: Not on file    Gets together: Not on file    Attends religious service: Not on file    Active member of club or organization: Not on file    Attends meetings of clubs or organizations: Not on file    Relationship status: Not on file  . Intimate partner violence:    Fear of current or ex partner: Not on file    Emotionally abused: Not on file    Physically abused: Not on file  Forced sexual activity: Not on file  Other Topics Concern  . 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 BALLOON DILATION  04/03/2012   Procedure: BALLOON DILATION;  Surgeon: Beryle Beams, MD;  [facility];  Service: Endoscopy;  Laterality: N/A;  . CHOLECYSTECTOMY N/A 04/24/2018   Procedure: LAPAROSCOPIC CHOLECYSTECTOMY;  Surgeon: Clovis Riley, MD;  [facility]: [facility];  Service: General;  Laterality: N/A;  . COLONOSCOPY    . DIAGNOSTIC LAPAROSCOPY  29yrs ago  . ESOPHAGEAL MANOMETRY N/A 09/04/2015   Procedure: ESOPHAGEAL MANOMETRY (EM);  Surgeon: Carol Ada, MD;  [facility];  Service: Endoscopy;  Laterality: N/A;  . ESOPHAGOGASTRODUODENOSCOPY  04/03/2012   Procedure: ESOPHAGOGASTRODUODENOSCOPY (EGD);  Surgeon: Beryle Beams, MD;  Location: [facility];  Service: Endoscopy;  Laterality:  N/A;  EGD w/ balloon dilation  . ESOPHAGOGASTRODUODENOSCOPY (EGD) WITH PROPOFOL N/A 02/18/2014   Procedure: ESOPHAGOGASTRODUODENOSCOPY (EGD) WITH PROPOFOL;  Surgeon: Beryle Beams, MD;  [facility];  Service: Endoscopy;  Laterality: N/A;  . ESOPHAGOGASTRODUODENOSCOPY (EGD) WITH PROPOFOL N/A 04/22/2014   Procedure: ESOPHAGOGASTRODUODENOSCOPY (EGD) WITH PROPOFOL;  Surgeon: Beryle Beams, MD;  [facility];  Service: Endoscopy;  Laterality: N/A;  . NASAL SEPTUM SURGERY  09/2016  . [place] IMPEDANCE STUDY N/A 09/04/2015   Procedure: [place] IMPEDANCE STUDY;  Surgeon: Carol Ada, MD;  [facility];  Service: Endoscopy;  Laterality: N/A;  . REPLACEMENT TOTAL KNEE Right 7yrs ago  . TOTAL KNEE ARTHROPLASTY Left 11/30/2012   Procedure: LEFT TOTAL KNEE ARTHROPLASTY;  Surgeon: Kerin Salen, MD;  Location: [place];  Service: Orthopedics;  Laterality: Left;    Family History  Problem Relation Age of Onset  . Arthritis Mother   . Cancer Mother        breast  . Heart disease Mother        died from "heart problems" at age 6, had CAD  . Depression Mother   . Heart disease Father        had CABG  . Kidney disease Father   . Arthritis Sister   . Heart murmur Sister   . Arthritis Brother        "serious back/neck problems"  . Arthritis Sister   . Aneurysm Sister     Allergies  Allergen Reactions  . Naratriptan     Ineffective    Current Outpatient Medications on File Prior to Visit  Medication Sig Dispense Refill  . Aspirin-Salicylamide-Caffeine (BC HEADACHE PO) Take 1 packet by mouth daily as needed (headaches).    . cyclobenzaprine (FLEXERIL) 10 MG tablet Take 1 tablet (10 mg total) by mouth at bedtime as needed for muscle spasms. 21 tablet 0  . eletriptan (RELPAX) 40 MG tablet TAKE 1 TABLET BY MOUTH AS NEEDED FOR MIGRAINE OF HEADACHE. MAY REPEAT IN 2 HOURS IF HEADACHE PERSISTS OR RECURS 10 tablet 5  . escitalopram (LEXAPRO) 20 MG tablet TAKE 1 TABLET(20 MG) BY MOUTH  DAILY 90 tablet 1  . omeprazole (PRILOSEC) 40 MG capsule Take 1 capsule (40 mg total) by mouth daily. 90 capsule 3  . oxyCODONE-acetaminophen (PERCOCET/ROXICET) 5-325 MG tablet Take 1 tablet by mouth every 6 (six) hours as needed for severe pain. 28 tablet 0  . RESTASIS 0.05 %  ophthalmic emulsion Place 1 drop into both eyes 2 (two) times daily.     . sucralfate (CARAFATE) 1 g tablet Take 1 g by mouth 4 (four) times daily.    [place] Kitchen topiramate (TOPAMAX) 100 MG tablet TAKE 1 TABLET(100 MG) BY MOUTH TWICE DAILY 180 tablet 5   No current facility-administered medications on file prior to visit.     BP 134/84 (BP Location: Right Arm, Patient Position: Sitting, Cuff Size: Small)   Pulse 75   Temp 98.5 F (36.9 C) (Oral)   Resp 16   Ht 5\' 4"  (1.626 m)   Wt 139 lb 6.4 oz (63.2 kg)   SpO2 99%   BMI 23.93 kg/m    Objective:   Physical Exam  Constitutional: She is oriented to person, place, and time. She appears well-developed and well-nourished.  Cardiovascular: Normal rate, regular rhythm and normal heart sounds.  No murmur heard. Pulmonary/Chest: Effort normal and breath sounds normal. No respiratory distress. She has no wheezes.  Neurological: She is alert and oriented to person, place, and time.  Skin: Skin is warm and dry.  Psychiatric: She has a normal mood and affect. Her behavior is normal. Judgment and thought content normal.          Assessment & Plan:  Back pain-she is requesting something for pain.  I told her I will give her a one-time dose of tramadol to get her through to her appointment with orthopedics early next week.  She is advised that further refills need to come from orthopedics.  Depression-this is stable on current dose of Lexapro.  Continue same.  Weight loss- blood work and chest x-ray all okay.  Her weight is up a few pounds from her last visit.  I have advised her to continue to monitor her weight at home and let me know if she has any further weight drop.   Patient verbalizes understanding.

## 2018-06-26 NOTE — Patient Instructions (Signed)
We have given you a one time prescription for tramadol as needed for pain.  Future refills will need to come from orthopedics.  Please keep your upcoming appointment with them.

## 2018-06-30 ENCOUNTER — Telehealth: Payer: Self-pay | Admitting: *Deleted

## 2018-06-30 DIAGNOSIS — M545 Low back pain: Secondary | ICD-10-CM | POA: Diagnosis not present

## 2018-06-30 NOTE — Telephone Encounter (Signed)
Received Endoscopy result notes from [facility]; forwarded to provider/SLS 10/15

## 2018-07-10 DIAGNOSIS — M546 Pain in thoracic spine: Secondary | ICD-10-CM | POA: Diagnosis not present

## 2018-07-10 DIAGNOSIS — M545 Low back pain: Secondary | ICD-10-CM | POA: Diagnosis not present

## 2018-07-14 DIAGNOSIS — M545 Low back pain: Secondary | ICD-10-CM | POA: Diagnosis not present

## 2018-07-21 DIAGNOSIS — Z803 Family history of malignant neoplasm of breast: Secondary | ICD-10-CM | POA: Diagnosis not present

## 2018-07-21 DIAGNOSIS — Z1231 Encounter for screening mammogram for malignant neoplasm of breast: Secondary | ICD-10-CM | POA: Diagnosis not present

## 2018-07-24 DIAGNOSIS — M47816 Spondylosis without myelopathy or radiculopathy, lumbar region: Secondary | ICD-10-CM | POA: Diagnosis not present

## 2018-07-24 DIAGNOSIS — M47818 Spondylosis without myelopathy or radiculopathy, sacral and sacrococcygeal region: Secondary | ICD-10-CM | POA: Diagnosis not present

## 2018-07-28 ENCOUNTER — Ambulatory Visit (INDEPENDENT_AMBULATORY_CARE_PROVIDER_SITE_OTHER): Payer: Medicare Other | Admitting: Family Medicine

## 2018-07-28 ENCOUNTER — Encounter: Payer: Self-pay | Admitting: Family

## 2018-07-28 VITALS — BP 133/62 | HR 78 | Temp 98.0°F | Resp 16 | Ht 64.0 in | Wt 135.0 lb

## 2018-07-28 DIAGNOSIS — K219 Gastro-esophageal reflux disease without esophagitis: Secondary | ICD-10-CM | POA: Diagnosis not present

## 2018-07-28 DIAGNOSIS — Z8719 Personal history of other diseases of the digestive system: Secondary | ICD-10-CM

## 2018-07-28 DIAGNOSIS — R634 Abnormal weight loss: Secondary | ICD-10-CM

## 2018-07-28 DIAGNOSIS — R1013 Epigastric pain: Secondary | ICD-10-CM | POA: Diagnosis not present

## 2018-07-28 DIAGNOSIS — Z8711 Personal history of peptic ulcer disease: Secondary | ICD-10-CM

## 2018-07-28 MED ORDER — DEXLANSOPRAZOLE 60 MG PO CPDR: 60.0000 mg | DELAYED_RELEASE_CAPSULE | Freq: Every day | ORAL | 2 refills | Status: DC

## 2018-07-28 NOTE — Patient Instructions (Signed)
Food Choices for Gastroesophageal Reflux Disease, Adult When you have gastroesophageal reflux disease (GERD), the foods you eat and your eating habits are very important. Choosing the right foods can help ease your discomfort. What guidelines do I need to follow?  Choose fruits, vegetables, whole grains, and low-fat dairy products.  Choose low-fat meat, fish, and poultry.  Limit fats such as oils, salad dressings, butter, nuts, and avocado.  Keep a food diary. This helps you identify foods that cause symptoms.  Avoid foods that cause symptoms. These may be different for everyone.  Eat small meals often instead of 3 large meals a day.  Eat your meals slowly, in a place where you are relaxed.  Limit fried foods.  Cook foods using methods other than frying.  Avoid drinking alcohol.  Avoid drinking large amounts of liquids with your meals.  Avoid bending over or lying down until 2-3 hours after eating. What foods are not recommended? These are some foods and drinks that may make your symptoms worse: Vegetables  Tomatoes. Tomato juice. Tomato and spaghetti sauce. Chili peppers. Onion and garlic. Horseradish. Fruits  Oranges, grapefruit, and lemon (fruit and juice). Meats  High-fat meats, fish, and poultry. This includes hot dogs, ribs, ham, sausage, salami, and bacon. Dairy  Whole milk and chocolate milk. Sour cream. Cream. Butter. Ice cream. Cream cheese. Drinks  Coffee and tea. Bubbly (carbonated) drinks or energy drinks. Condiments  Hot sauce. Barbecue sauce. Sweets/Desserts  Chocolate and cocoa. Donuts. Peppermint and spearmint. Fats and Oils  High-fat foods. This includes French fries and potato chips. Other  Vinegar. Strong spices. This includes black pepper, white pepper, red pepper, cayenne, curry powder, cloves, ginger, and chili powder. The items listed above may not be a complete list of foods and drinks to avoid. Contact your dietitian for more information.    This information is not intended to replace advice given to you by your health care provider. Make sure you discuss any questions you have with your health care provider. Document Released: 03/03/2012 Document Revised: 02/08/2016 Document Reviewed: 07/07/2013 Elsevier Interactive Patient Education  2017 [employer]  

## 2018-07-28 NOTE — Progress Notes (Deleted)
Patient presents today for follow up of multiple medical problems.  Diabetes Type 2  Pt is currently maintained on the following medications for diabetes:***  Lab Results  Component Value Date   HGBA1C  02/19/2007    5.2 (NOTE)   The ADA recommends the following therapeutic goals for glycemic   control related to Hgb A1C measurement:   Goal of Therapy:   < 7.0% Hgb A1C   Action Suggested:  > 8.0% Hgb A1C   Ref:  Diabetes Care, 22, Suppl. 1, 1999    Lab Results  Component Value Date   LDLCALC 173 (H) 01/07/2017   CREATININE 0.96 06/19/2018    Last diabetic eye exam was No results found for: HMDIABEYEEXA Denies polyuria/polydipsia. Denies hypoglycemia Home glucose readings range ***  Hyperlipidemia  Patient is currently maintained on the following medication for hyperlipidemia: *** Last lipid panel as follows:  Lab Results  Component Value Date   CHOL 246 (H) 01/07/2017   HDL 55.30 01/07/2017   LDLCALC 173 (H) 01/07/2017   TRIG 85.0 01/07/2017   CHOLHDL 4 01/07/2017   Patient denies myalgia. Patient reports good compliance with low fat/low cholesterol diet.   Hypertension  Patient is currently maintained on the following medications for blood pressure: *** Patient reports good compliance with blood pressure medications. Patient denies chest pain, shortness of breath or swelling. Last 3 blood pressure readings in our office are as follows: BP Readings from Last 3 Encounters:  07/28/18 133/62  06/26/18 134/84  06/19/18 124/66     Patient ID: Lindsay Frost, female    DOB: 12-23-1955  Age: 62 y.o. MRN: [id number]    Subjective:  Subjective  HPI Lindsay Frost presents for ***  Review of Systems  History Past Medical History:  Diagnosis Date  . Arthritis   . Back pain    arthritis  . Depression    takes cymbalta daily  . GERD (gastroesophageal reflux disease)    takes Omeprazole daily  . Headache(784.0)    migraines  . Heart murmur   . History of  gastric ulcer   . History of migraine    last one about 2wks ago-takes Imitrex prn  . Joint pain   . Joint swelling   . Meningioma ([place])   . Ulcer     She has a past surgical history that includes Replacement total knee (Right, 19yrs ago); Esophagogastroduodenoscopy (04/03/2012); Balloon dilation (04/03/2012); Diagnostic laparoscopy (81yrs ago); Colonoscopy; Total knee arthroplasty (Left, 11/30/2012); Esophagogastroduodenoscopy (egd) with propofol (N/A, 02/18/2014); Esophagogastroduodenoscopy (egd) with propofol (N/A, 04/22/2014); Esophageal manometry (N/A, 09/04/2015); PH impedance study (N/A, 09/04/2015); Nasal septum surgery (09/2016); and Cholecystectomy (N/A, 04/24/2018).   Her family history includes Aneurysm in her sister; Arthritis in her brother, mother, sister, and sister; Cancer in her mother; Depression in her mother; Heart disease in her father and mother; Heart murmur in her sister; Kidney disease in her father.She reports that she has never smoked. She has never used smokeless tobacco. She reports that she does not drink alcohol or use drugs.  Current Outpatient Medications on File Prior to Visit  Medication Sig Dispense Refill  . Aspirin-Salicylamide-Caffeine (BC HEADACHE PO) Take 1 packet by mouth daily as needed (headaches).    . cyclobenzaprine (FLEXERIL) 10 MG tablet Take 1 tablet (10 mg total) by mouth at bedtime as needed for muscle spasms. 21 tablet 0  . eletriptan (RELPAX) 40 MG tablet TAKE 1 TABLET BY MOUTH AS NEEDED FOR MIGRAINE OF HEADACHE. MAY REPEAT IN 2 HOURS IF  HEADACHE PERSISTS OR RECURS 10 tablet 5  . escitalopram (LEXAPRO) 20 MG tablet TAKE 1 TABLET(20 MG) BY MOUTH DAILY 90 tablet 1  . omeprazole (PRILOSEC) 40 MG capsule Take 1 capsule (40 mg total) by mouth daily. 90 capsule 3  . RESTASIS 0.05 % ophthalmic emulsion Place 1 drop into both eyes 2 (two) times daily.     . sucralfate (CARAFATE) 1 g tablet Take 1 g by mouth 4 (four) times daily.    Marland Kitchen topiramate (TOPAMAX) 100  MG tablet TAKE 1 TABLET(100 MG) BY MOUTH TWICE DAILY 180 tablet 5  . traMADol (ULTRAM) 50 MG tablet Take 1 tablet (50 mg total) by mouth every 8 (eight) hours as needed. 15 tablet 0   No current facility-administered medications on file prior to visit.      Objective:  Objective  Physical Exam  Constitutional: She is oriented to person, place, and time. She appears well-developed and well-nourished. No distress.  HENT:  Head: Normocephalic and atraumatic.  Right Ear: External ear normal.  Left Ear: External ear normal.  Nose: Nose normal.  Mouth/Throat: Oropharynx is clear and moist.  Eyes: Pupils are equal, round, and reactive to light. Conjunctivae and EOM are normal.  Neck: Normal range of motion. Neck supple. No JVD present. Carotid bruit is not present. No thyromegaly present.  Cardiovascular: Normal rate, regular rhythm and normal heart sounds.  No murmur heard. Pulmonary/Chest: Effort normal and breath sounds normal. No respiratory distress. She has no wheezes. She has no rales. She exhibits no tenderness.  Abdominal: Bowel sounds are normal. There is tenderness in the epigastric area. There is no rigidity, no rebound, no guarding, no CVA tenderness, no tenderness at McBurney's point and negative Murphy's sign.  Musculoskeletal: She exhibits no edema.  Neurological: She is alert and oriented to person, place, and time.  Psychiatric: She has a normal mood and affect. Her behavior is normal. Judgment and thought content normal.  Nursing note and vitals reviewed.  BP 133/62 (BP Location: Left Arm, Cuff Size: Normal)   Pulse 78   Temp 98 F (36.7 C) (Oral)   Resp 16   Ht 5\' 4"  (1.626 m)   Wt 135 lb (61.2 kg)   SpO2 99%   BMI 23.17 kg/m  Wt Readings from Last 3 Encounters:  07/28/18 135 lb (61.2 kg)  06/26/18 139 lb 6.4 oz (63.2 kg)  06/19/18 136 lb 8 oz (61.9 kg)     Lab Results  Component Value Date   WBC 5.9 06/19/2018   HGB 12.6 06/19/2018   HCT 38.0 06/19/2018     PLT 350 06/19/2018   GLUCOSE 81 06/19/2018   CHOL 246 (H) 01/07/2017   TRIG 85.0 01/07/2017   HDL 55.30 01/07/2017   LDLCALC 173 (H) 01/07/2017   ALT 6 06/19/2018   AST 13 06/19/2018   NA 141 06/19/2018   K 3.5 06/19/2018   CL 108 06/19/2018   CREATININE 0.96 06/19/2018   BUN 14 06/19/2018   CO2 21 06/19/2018   TSH 1.20 06/19/2018   INR 0.98 11/24/2012   HGBA1C  02/19/2007    5.2 (NOTE)   The ADA recommends the following therapeutic goals for glycemic   control related to Hgb A1C measurement:   Goal of Therapy:   < 7.0% Hgb A1C   Action Suggested:  > 8.0% Hgb A1C   Ref:  Diabetes Care, 22, Suppl. 1, 1999    Dg Chest 2 View  Result Date: 06/19/2018 CLINICAL DATA:  Weight loss,  mid back pain. EXAM: CHEST - 2 VIEW COMPARISON:  04/22/2017 FINDINGS: Heart and mediastinal contours are within normal limits. No focal opacities or effusions. No acute bony abnormality. IMPRESSION: No active cardiopulmonary disease. Electronically Signed   By: Rolm Baptise M.D.   On: 06/19/2018 16:37     Assessment & Plan:  Plan  I am having Lindsay Glassing Parekh maintain her RESTASIS, topiramate, escitalopram, sucralfate, Aspirin-Salicylamide-Caffeine (BC HEADACHE PO), eletriptan, cyclobenzaprine, omeprazole, and traMADol.  No orders of the defined types were placed in this encounter.   Problem List Items Addressed This Visit    None      Follow-up: No follow-ups on file.  Ann Held, DO

## 2018-07-28 NOTE — Progress Notes (Signed)
Patient ID: Lindsay Frost, female    DOB: December 07, 1955  Age: 62 y.o. MRN: [id number]    Subjective:  Subjective  HPI Lindsay Frost presents for midepigastric pain ==/-- she has see GI and was told nothing else could be done.  She is requesting a referral for another opinion She also has a copy of an MRI of the back that was done by her neurosurgeon and there was an abnormality on it - she was told to f/u with her pcp.   Mri showed ? Benign hemangioma vs metastatic dz.  She has had an 80 lb weight loss in the last year and is very concerned   Review of Systems  Constitutional: Positive for appetite change and unexpected weight change. Negative for diaphoresis and fatigue.  Eyes: Negative for pain, redness and visual disturbance.  Respiratory: Negative for cough, chest tightness, shortness of breath and wheezing.   Cardiovascular: Negative for chest pain, palpitations and leg swelling.  Gastrointestinal: Positive for abdominal pain and nausea. Negative for blood in stool, constipation, diarrhea and vomiting.  Endocrine: Negative for cold intolerance, heat intolerance, polydipsia, polyphagia and polyuria.  Genitourinary: Negative for difficulty urinating, dysuria and frequency.  Musculoskeletal: Positive for back pain.  Neurological: Negative for dizziness, light-headedness, numbness and headaches.    History Past Medical History:  Diagnosis Date  . Arthritis   . Back pain    arthritis  . Depression    takes cymbalta daily  . GERD (gastroesophageal reflux disease)    takes Omeprazole daily  . Headache(784.0)    migraines  . Heart murmur   . History of gastric ulcer   . History of migraine    last one about 2wks ago-takes Imitrex prn  . Joint pain   . Joint swelling   . Meningioma ([place])   . Ulcer     She has a past surgical history that includes Replacement total knee (Right, 18yrs ago); Esophagogastroduodenoscopy (04/03/2012); Balloon dilation (04/03/2012); Diagnostic  laparoscopy (50yrs ago); Colonoscopy; Total knee arthroplasty (Left, 11/30/2012); Esophagogastroduodenoscopy (egd) with propofol (N/A, 02/18/2014); Esophagogastroduodenoscopy (egd) with propofol (N/A, 04/22/2014); Esophageal manometry (N/A, 09/04/2015); PH impedance study (N/A, 09/04/2015); Nasal septum surgery (09/2016); and Cholecystectomy (N/A, 04/24/2018).   Her family history includes Aneurysm in her sister; Arthritis in her brother, mother, sister, and sister; Cancer in her mother; Depression in her mother; Heart disease in her father and mother; Heart murmur in her sister; Kidney disease in her father.She reports that she has never smoked. She has never used smokeless tobacco. She reports that she does not drink alcohol or use drugs.  Current Outpatient Medications on File Prior to Visit  Medication Sig Dispense Refill  . Aspirin-Salicylamide-Caffeine (BC HEADACHE PO) Take 1 packet by mouth daily as needed (headaches).    . cyclobenzaprine (FLEXERIL) 10 MG tablet Take 1 tablet (10 mg total) by mouth at bedtime as needed for muscle spasms. 21 tablet 0  . eletriptan (RELPAX) 40 MG tablet TAKE 1 TABLET BY MOUTH AS NEEDED FOR MIGRAINE OF HEADACHE. MAY REPEAT IN 2 HOURS IF HEADACHE PERSISTS OR RECURS 10 tablet 5  . escitalopram (LEXAPRO) 20 MG tablet TAKE 1 TABLET(20 MG) BY MOUTH DAILY 90 tablet 1  . RESTASIS 0.05 % ophthalmic emulsion Place 1 drop into both eyes 2 (two) times daily.     . sucralfate (CARAFATE) 1 g tablet Take 1 g by mouth 4 (four) times daily.    Marland Kitchen topiramate (TOPAMAX) 100 MG tablet TAKE 1 TABLET(100 MG) BY MOUTH TWICE DAILY  180 tablet 5  . traMADol (ULTRAM) 50 MG tablet Take 1 tablet (50 mg total) by mouth every 8 (eight) hours as needed. 15 tablet 0   No current facility-administered medications on file prior to visit.      Objective:  Objective  Physical Exam  Constitutional: She is oriented to person, place, and time. She appears well-developed and well-nourished.  HENT:  Head:  Normocephalic and atraumatic.  Eyes: Conjunctivae and EOM are normal.  Neck: Normal range of motion. Neck supple. No JVD present. Carotid bruit is not present. No thyromegaly present.  Cardiovascular: Normal rate, regular rhythm and normal heart sounds.  No murmur heard. Pulmonary/Chest: Effort normal and breath sounds normal. No respiratory distress. She has no wheezes. She has no rales. She exhibits no tenderness.  Abdominal: There is tenderness in the epigastric area. There is no rebound, no guarding and negative Murphy's sign.  Musculoskeletal: She exhibits no edema.  Neurological: She is alert and oriented to person, place, and time.  Psychiatric: She has a normal mood and affect.  Nursing note and vitals reviewed.  BP 133/62 (BP Location: Left Arm, Cuff Size: Normal)   Pulse 78   Temp 98 F (36.7 C) (Oral)   Resp 16   Ht 5\' 4"  (1.626 m)   Wt 135 lb (61.2 kg)   SpO2 99%   BMI 23.17 kg/m  Wt Readings from Last 3 Encounters:  07/28/18 135 lb (61.2 kg)  06/26/18 139 lb 6.4 oz (63.2 kg)  06/19/18 136 lb 8 oz (61.9 kg)     Lab Results  Component Value Date   WBC 6.4 07/28/2018   HGB 13.2 07/28/2018   HCT 39.6 07/28/2018   PLT 373 07/28/2018   GLUCOSE 81 06/19/2018   CHOL 246 (H) 01/07/2017   TRIG 85.0 01/07/2017   HDL 55.30 01/07/2017   LDLCALC 173 (H) 01/07/2017   ALT 6 06/19/2018   AST 13 06/19/2018   NA 141 06/19/2018   K 3.5 06/19/2018   CL 108 06/19/2018   CREATININE 0.96 06/19/2018   BUN 14 06/19/2018   CO2 21 06/19/2018   TSH 1.20 06/19/2018   INR 0.98 11/24/2012   HGBA1C  02/19/2007    5.2 (NOTE)   The ADA recommends the following therapeutic goals for glycemic   control related to Hgb A1C measurement:   Goal of Therapy:   < 7.0% Hgb A1C   Action Suggested:  > 8.0% Hgb A1C   Ref:  Diabetes Care, 22, Suppl. 1, 1999    Dg Chest 2 View  Result Date: 06/19/2018 CLINICAL DATA:  Weight loss, mid back pain. EXAM: CHEST - 2 VIEW COMPARISON:  04/22/2017  FINDINGS: Heart and mediastinal contours are within normal limits. No focal opacities or effusions. No acute bony abnormality. IMPRESSION: No active cardiopulmonary disease. Electronically Signed   By: Rolm Baptise M.D.   On: 06/19/2018 16:37     Assessment & Plan:  Plan  I have discontinued Juliann Pulse H. Duey's omeprazole. I am also having her start on dexlansoprazole. Additionally, I am having her maintain her RESTASIS, topiramate, escitalopram, sucralfate, Aspirin-Salicylamide-Caffeine (BC HEADACHE PO), eletriptan, cyclobenzaprine, and traMADol.  Meds ordered this encounter  Medications  . dexlansoprazole (DEXILANT) 60 MG capsule    Sig: Take 1 capsule (60 mg total) by mouth daily.    Dispense:  30 capsule    Refill:  2    Problem List Items Addressed This Visit      Unprioritized   Abnormal weight loss  Discussed weight loss with radiology They re read mri and feel the abnormality seen is more likely a benign hemangioma  The advised working up the weight loss first       Relevant Orders   CT Abdomen Pelvis W Contrast   CBC (INCLUDES DIFF/PLT) WITH PATHOLOGIST REVIEW (Completed)   Epigastric pain    Hx of ulcer / gerd  Take dexilant daily con't con't pepcid prn Refer GI       GERD (gastroesophageal reflux disease)   Relevant Medications   dexlansoprazole (DEXILANT) 60 MG capsule    Other Visit Diagnoses    History of gastric ulcer    -  Primary   Relevant Medications   dexlansoprazole (DEXILANT) 60 MG capsule   Other Relevant Orders   Ambulatory referral to Gastroenterology      Follow-up: Return if symptoms worsen or fail to improve.  Ann Held, DO

## 2018-07-29 ENCOUNTER — Encounter: Payer: Self-pay | Admitting: Family Medicine

## 2018-07-29 DIAGNOSIS — R634 Abnormal weight loss: Secondary | ICD-10-CM | POA: Insufficient documentation

## 2018-07-29 DIAGNOSIS — R1013 Epigastric pain: Secondary | ICD-10-CM | POA: Insufficient documentation

## 2018-07-29 HISTORY — DX: Epigastric pain: R10.13

## 2018-07-29 LAB — CBC (INCLUDES DIFF/PLT) WITH PATHOLOGIST REVIEW
Basophils Absolute: 102 cells/uL (ref 0–200)
Basophils Relative: 1.6 %
Eosinophils Absolute: 128 cells/uL (ref 15–500)
Eosinophils Relative: 2 %
HCT: 39.6 % (ref 35.0–45.0)
Hemoglobin: 13.2 g/dL (ref 11.7–15.5)
Lymphs Abs: 2560 cells/uL (ref 850–3900)
MCH: 29.4 pg (ref 27.0–33.0)
MCHC: 33.3 g/dL (ref 32.0–36.0)
MCV: 88.2 fL (ref 80.0–100.0)
MPV: 10.9 fL (ref 7.5–12.5)
Monocytes Relative: 7.1 %
Neutro Abs: 3155 cells/uL (ref 1500–7800)
Neutrophils Relative %: 49.3 %
Platelets: 373 10*3/uL (ref 140–400)
RBC: 4.49 10*6/uL (ref 3.80–5.10)
RDW: 13 % (ref 11.0–15.0)
Total Lymphocyte: 40 %
WBC mixed population: 454 cells/uL (ref 200–950)
WBC: 6.4 10*3/uL (ref 3.8–10.8)

## 2018-07-29 NOTE — Assessment & Plan Note (Signed)
Discussed weight loss with radiology They re read mri and feel the abnormality seen is more likely a benign hemangioma  The advised working up the weight loss first

## 2018-07-29 NOTE — Assessment & Plan Note (Signed)
Hx of ulcer / gerd  Take dexilant daily con't con't pepcid prn Refer GI

## 2018-07-31 ENCOUNTER — Other Ambulatory Visit: Payer: Self-pay | Admitting: Family Medicine

## 2018-07-31 DIAGNOSIS — R109 Unspecified abdominal pain: Secondary | ICD-10-CM

## 2018-08-06 DIAGNOSIS — M47816 Spondylosis without myelopathy or radiculopathy, lumbar region: Secondary | ICD-10-CM | POA: Diagnosis not present

## 2018-08-06 DIAGNOSIS — M545 Low back pain: Secondary | ICD-10-CM | POA: Diagnosis not present

## 2018-08-11 ENCOUNTER — Telehealth: Payer: Self-pay | Admitting: Family

## 2018-08-11 ENCOUNTER — Encounter: Payer: Self-pay | Admitting: Family

## 2018-08-11 NOTE — Telephone Encounter (Signed)
Will mail letter to patient.     Carollee Herter, Alferd Apa, DO  Debbrah Alar, NP        Just FYI--- Pt has not returned radiology phone calls   Previous Messages    ----- Message -----  From: Lindsay Frost  Sent: 08/10/2018  3:10 PM EST  To: Lindsay Held, DO, *  Subject: ct                        We have called Lindsay Frost a couple of times to get labs and schedule her CT.  She has not done so.   Thanks,  Hoyle Sauer and [employer]

## 2018-08-17 ENCOUNTER — Other Ambulatory Visit (INDEPENDENT_AMBULATORY_CARE_PROVIDER_SITE_OTHER): Payer: Medicare Other

## 2018-08-17 DIAGNOSIS — R109 Unspecified abdominal pain: Secondary | ICD-10-CM | POA: Diagnosis not present

## 2018-08-17 LAB — BASIC METABOLIC PANEL
BUN: 16 mg/dL (ref 6–23)
CO2: 22 mEq/L (ref 19–32)
Calcium: 8.6 mg/dL (ref 8.4–10.5)
Chloride: 112 mEq/L (ref 96–112)
Creatinine, Ser: 0.91 mg/dL (ref 0.40–1.20)
GFR: 66.54 mL/min (ref >60.00)
Glucose, Bld: 86 mg/dL (ref 70–99)
Potassium: 3.5 mEq/L (ref 3.5–5.1)
Sodium: 143 mEq/L (ref 135–145)

## 2018-08-19 ENCOUNTER — Ambulatory Visit (HOSPITAL_BASED_OUTPATIENT_CLINIC_OR_DEPARTMENT_OTHER)
Admission: RE | Admit: 2018-08-19 | Discharge: 2018-08-19 | Disposition: A | Discharge status: Home / Self Care | Payer: Medicare Other | Source: Ambulatory Visit | Attending: Family Medicine | Admitting: Family Medicine

## 2018-08-19 ENCOUNTER — Encounter (HOSPITAL_BASED_OUTPATIENT_CLINIC_OR_DEPARTMENT_OTHER): Payer: Self-pay

## 2018-08-19 DIAGNOSIS — R1011 Right upper quadrant pain: Secondary | ICD-10-CM | POA: Diagnosis not present

## 2018-08-19 DIAGNOSIS — K219 Gastro-esophageal reflux disease without esophagitis: Secondary | ICD-10-CM | POA: Diagnosis not present

## 2018-08-19 DIAGNOSIS — R634 Abnormal weight loss: Secondary | ICD-10-CM | POA: Diagnosis present

## 2018-08-19 MED ORDER — IOPAMIDOL (ISOVUE-300) INJECTION 61%
100.0000 mL | Freq: Once | INTRAVENOUS | Status: AC | PRN
  Administered 2018-08-19: 100 mL via INTRAVENOUS

## 2018-08-19 NOTE — Progress Notes (Signed)
NEUROLOGY FOLLOW UP OFFICE NOTE  Lindsay Frost [id number]  HISTORY OF PRESENT ILLNESS: Lindsay Frost is a 62 year old right-handed woman with depression, migraine and GERD who follows up for migraines and possible history of seizures.  UPDATE: Migraine: Intensity:  Mild-moderate Duration:  hour Frequency:  Once a week over past 30 days, sometimes 2 days in a row. Current NSAIDS:  Ibuprofen Current analgesics:  Tylenol Current triptans:  Relpax Current ergotamine:  none Current anti-emetic:  none Current muscle relaxants:  Flexeril (for back pain) Current anti-anxiolytic:  none Current sleep aide:  none Current Antihypertensive medications:  none Current Antidepressant medications:  Lexapro 20mg  Current Anticonvulsant medications:  topiramate 100mg  twice daily Current anti-CGRP:  none Current Vitamins/Herbal/Supplements:  none Current Antihistamines/Decongestants:  none Other therapy:  none  Caffeine:  Coffee, tea, soda Alcohol:  no Smoker:  no Diet:  Junk food Exercise:  Walks dog Depression:  no; Anxiety:  no Other pain:  Back pain Sleep hygiene:  good  Seizures: No seizures.  Last spell occurred 05/27/15.   Current preventative: topiramate 100mg  twice daily  HISTORY: Migraine: Onset: Early 40s Location: Left frontal Quality: Stabbing Initial Intensity: 10 out of 10; September 7/10 Aura: No Prodrome: Photophobia Associated symptoms: Nausea, phonophobia, photophobia, sometimes vomiting. No osmophobia, visual disturbance, or autonomic symptoms.  No associated unilateral numbness or weakness. Initial Duration: 2-3 days; September 3 hours with Relpax Initial Frequency: Migraines occur every 2 weeks (4-6 days per month) and dull headaches occur 2 times a week (8 days per month). Total of 14 headache days per month; September 3 times a month Triggers/exacerbating factors: Heat Relieving factors: Laying down in a quiet, dark, cool room with ice  pack. Activity: Cannot function when she has migraine  Past abortive therapy: sumatriptan 50mg  (ineffective, caused racing heart), Maxalt (ineffective), BC powder Past preventative therapy: Topamax (side effects, ineffective), Cymbalta (for depression), nortriptyline 50mg  (discontinued in order to simplify medication list) Unable to start Inderal LA because insurance wouldn't cover it.  Family history of headache: daughter  Seizure: Her Wellbutrin was increased in August 2016 to help control her depression. At around that time, she had an episode where she woke up and found herself on the bathroom floor. She didn't remember falling. Later in the month, she had another episode where she woke up on the floor next to her bed and noted that she was briefly kicking both legs. She had a severe rug burn on the left side of her face. She did not bite her tongue during either event but she did have some urinary incontinence during the episode in the bathroom. Her PCP, Dr. Birdie Riddle, subsequently decreased her dose of Wellbutrin. She has no prior history of seizures. She had a sleep deprived EEG performed on 06/07/14, which showed brief intermittent transient sharp waves and slowing in the left temporal region, which although not epileptiform, could be a seizure focus.  On 05/27/15, she had a different spell.  It was unwitnessed.  When she woke up, she was short of breath and it took a few minutes to recover.  Sometimes, she will wake up short of breath, but this episode was worse.  She did not have incontinence or tongue biting.    MRI of brain with and without contrast from 08/19/13 demonstrated a 12 mm left parietal calcified meningioma without surrounding vasogenic edema, minimally increased compared to prior study from 10/2003. MRI of the brain with and without contrast was performed on 06/08/14, which demonstrated stability of the  meningioma.  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  Past antiepileptic medication:  Keppra  PAST MEDICAL HISTORY: Past Medical History:  Diagnosis Date  . Arthritis   . Back pain    arthritis  . Depression    takes cymbalta daily  . GERD (gastroesophageal reflux disease)    takes Omeprazole daily  . Headache(784.0)    migraines  . Heart murmur   . History of gastric ulcer   . History of migraine    last one about 2wks ago-takes Imitrex prn  . Joint pain   . Joint swelling   . Meningioma ([place])   . Ulcer     MEDICATIONS: Current Outpatient Medications on File Prior to Visit  Medication Sig Dispense Refill  . Aspirin-Salicylamide-Caffeine (BC HEADACHE PO) Take 1 packet by mouth daily as needed (headaches).    . cyclobenzaprine (FLEXERIL) 10 MG tablet Take 1 tablet (10 mg total) by mouth at bedtime as needed for muscle spasms. 21 tablet 0  . dexlansoprazole (DEXILANT) 60 MG capsule Take 1 capsule (60 mg total) by mouth daily. 30 capsule 2  . eletriptan (RELPAX) 40 MG tablet TAKE 1 TABLET BY MOUTH AS NEEDED FOR MIGRAINE OF HEADACHE. MAY REPEAT IN 2 HOURS IF HEADACHE PERSISTS OR RECURS 10 tablet 5  . escitalopram (LEXAPRO) 20 MG tablet TAKE 1 TABLET(20 MG) BY MOUTH DAILY 90 tablet 1  . RESTASIS 0.05 % ophthalmic emulsion Place 1 drop into both eyes 2 (two) times daily.     . sucralfate (CARAFATE) 1 g tablet Take 1 g by mouth 4 (four) times daily.    Marland Kitchen topiramate (TOPAMAX) 100 MG tablet TAKE 1 TABLET(100 MG) BY MOUTH TWICE DAILY 180 tablet 5  . traMADol (ULTRAM) 50 MG tablet Take 1 tablet (50 mg total) by mouth every 8 (eight) hours as needed. 15 tablet 0   No current facility-administered medications on file prior to visit.     ALLERGIES: Allergies  Allergen Reactions  . Naratriptan     Ineffective    FAMILY HISTORY: Family History  Problem Relation Age of Onset  . Arthritis Mother   . Cancer  Mother        breast  . Heart disease Mother        died from "heart problems" at age 86, had CAD  . Depression Mother   . Heart disease Father        had CABG  . Kidney disease Father   . Arthritis Sister   . Heart murmur Sister   . Arthritis Brother        "serious back/neck problems"  . Arthritis Sister   . Aneurysm Sister    SOCIAL HISTORY: Social History   Socioeconomic History  . Marital status: Single    Spouse name: Not on file  . Number of children: Not on file  . Years of education: Not on file  . Highest education level: Not on file  Occupational History  . Not on file  Social Needs  . Financial resource strain: Not on file  . Food insecurity:    Worry: Not on file    Inability: Not on file  . Transportation needs:    Medical: Not on file    Non-medical: Not on file  Tobacco Use  . Smoking status: Never Smoker  . Smokeless tobacco: Never Used  Substance and Sexual  Activity  . Alcohol use: No  . Drug use: No  . Sexual activity: Not Currently    Birth control/protection: Post-menopausal  Lifestyle  . Physical activity:    Days per week: Not on file    Minutes per session: Not on file  . Stress: Not on file  Relationships  . Social connections:    Talks on phone: Not on file    Gets together: Not on file    Attends religious service: Not on file    Active member of club or organization: Not on file    Attends meetings of clubs or organizations: Not on file    Relationship status: Not on file  . Intimate partner violence:    Fear of current or ex partner: Not on file    Emotionally abused: Not on file    Physically abused: Not on file    Forced sexual activity: Not on file  Other Topics Concern  . 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, mining   Single   1 daughter- lives in [place]   2 dogs   Enjoys reading, watching television    REVIEW OF SYSTEMS: Constitutional: No fevers,  chills, or sweats, no generalized fatigue, change in appetite Eyes: No visual changes, double vision, eye pain Ear, nose and throat: No hearing loss, ear pain, nasal congestion, sore throat Cardiovascular: No chest pain, palpitations Respiratory:  No shortness of breath at rest or with exertion, wheezes GastrointestinaI: No nausea, vomiting, diarrhea, abdominal pain, fecal incontinence Genitourinary:  No dysuria, urinary retention or frequency Musculoskeletal:  Back pain Integumentary: No rash, pruritus, skin lesions Neurological: as above Psychiatric: No depression, insomnia, anxiety Endocrine: No palpitations, fatigue, diaphoresis, mood swings, change in appetite, change in weight, increased thirst Hematologic/Lymphatic:  No purpura, petechiae. Allergic/Immunologic: no itchy/runny eyes, nasal congestion, recent allergic reactions, rashes  PHYSICAL EXAM: Blood pressure 122/70, pulse 66, height 5\' 4"  (1.626 m), weight 136 lb (61.7 kg), SpO2 98 %. General: No acute distress.  Patient appears well-groomed.  Head:  Normocephalic/atraumatic Eyes:  Fundi examined but not visualized Neck: supple, no paraspinal tenderness, full range of motion Heart:  Regular rate and rhythm Lungs:  Clear to auscultation bilaterally Back: bilateral mid to lower tenderness Neurological Exam: alert and oriented to person, place, and time. Attention span and concentration intact, recent and remote memory intact, fund of knowledge intact.  Speech fluent and not dysarthric, language intact.  CN II-XII intact. Bulk and tone normal, muscle strength 5/5 throughout.  Sensation to light touch intact.  Deep tendon reflexes 2+ throughout, toes downgoing.  Finger to nose testing intact.  Gait normal, Romberg negative.  IMPRESSION: Migraine without aura, without status migrainosus, not intractable Seizure disorder, stable  PLAN: 1.  topiramate 100mg  twice daily 2.  For abortive therapy, Relpax or ibuprofen or Tylenol 3.   Limit use of pain relievers to no more than 2 days out of week to prevent risk of rebound or medication-overuse headache. 4.  Keep headache diary 5.  Follow up in 6 months.  Metta Clines, DO  CC: Debbrah Alar, NP

## 2018-08-20 ENCOUNTER — Encounter: Payer: Self-pay | Admitting: Neurology

## 2018-08-20 ENCOUNTER — Ambulatory Visit (INDEPENDENT_AMBULATORY_CARE_PROVIDER_SITE_OTHER): Payer: Medicare Other | Admitting: Neurology

## 2018-08-20 VITALS — BP 122/70 | HR 66 | Ht 64.0 in | Wt 136.0 lb

## 2018-08-20 DIAGNOSIS — R569 Unspecified convulsions: Secondary | ICD-10-CM

## 2018-08-20 DIAGNOSIS — G43009 Migraine without aura, not intractable, without status migrainosus: Secondary | ICD-10-CM | POA: Diagnosis not present

## 2018-08-20 NOTE — Patient Instructions (Signed)
1.  Continue topiramate 100mg  twice daily 2.  Continue Relpax as needed.  Limit use of pain relievers to no more than 2 days out of week to prevent risk of rebound or medication-overuse headache. 3.  Follow up in 6 months.

## 2018-08-22 ENCOUNTER — Other Ambulatory Visit: Payer: Self-pay | Admitting: Neurology

## 2018-09-16 NOTE — Progress Notes (Signed)
[facility] [address], [place],  [address] [phone]  Date:  09/17/2018   Name:  Lindsay Frost   DOB:  31-Jul-1956   MRN:  [id number]  PCP:  Debbrah Alar, NP    Chief Complaint: Sinusitis (Productive dark phlem, epistaxis, congestion, dizziness, one month)   History of Present Illness:  Lindsay Frost is a 63 y.o. very pleasant female patient who presents with the following:  Patient of Debbrah Alar here today with concern of sinus infection She suffers from migraines and is a patient of neurology He does have a possible history of seizures without any recent attack She also had a lap chole in August of 2019- she is recovered from this  She has noted sx of a sinus infection for about a month She has noted congestion, nosebleeds, pressure and sinus headache, and is coughing up a discolored mucus She has felt run down  No fever noted Her chest has felt raw some of the time, she thinks from cough She is blowing discolored mucus from her nose No GI symptoms  She has tried vicks cough syrup, sudafed She will feel better but then her sx return  Patient Active Problem List   Diagnosis Date Noted  . Abnormal weight loss 07/29/2018  . Epigastric pain 07/29/2018  . Allergic rhinitis 07/16/2016  . Nocturnal leg cramps 02/20/2015  . Migraine without aura and without status migrainosus, not intractable 01/13/2015  . 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 Obesity (BMI 30-39.9) 07/22/2014  . Physical exam 07/22/2014  . Seizure-like activity ([place]) 05/12/2014  . Depression 12/14/2012  . GERD (gastroesophageal reflux disease) 12/14/2012  . Migraine 12/14/2012  . Muscle spasm 12/14/2012  . Osteoarthritis of left knee 12/02/2012    Past Medical History:  Diagnosis Date  . Arthritis   . Back pain    arthritis  . Depression    takes cymbalta daily   . GERD (gastroesophageal reflux disease)    takes Omeprazole daily  . Headache(784.0)    migraines  . Heart murmur   . History of gastric ulcer   . History of migraine    last one about 2wks ago-takes Imitrex prn  . Joint pain   . Joint swelling   . Meningioma ([place])   . Ulcer     Past Surgical History:  Procedure Laterality Date  . BALLOON DILATION  04/03/2012   Procedure: BALLOON DILATION;  Surgeon: Beryle Beams, MD;  Location: [facility];  Service: Endoscopy;  Laterality: N/A;  . CHOLECYSTECTOMY N/A 04/24/2018   Procedure: LAPAROSCOPIC CHOLECYSTECTOMY;  Surgeon: Clovis Riley, MD;  Location: [facility];  Service: General;  Laterality: N/A;  . COLONOSCOPY    . DIAGNOSTIC LAPAROSCOPY  75yrs ago  . ESOPHAGEAL MANOMETRY N/A 09/04/2015   Procedure: ESOPHAGEAL MANOMETRY (EM);  Surgeon: Carol Ada, MD;  Location: [facility];  Service: Endoscopy;  Laterality: N/A;  . ESOPHAGOGASTRODUODENOSCOPY  04/03/2012   Procedure: ESOPHAGOGASTRODUODENOSCOPY (EGD);  Surgeon: Beryle Beams, MD;  Location: [facility];  Service: Endoscopy;  Laterality: N/A;  EGD w/ balloon dilation  . ESOPHAGOGASTRODUODENOSCOPY (EGD) WITH PROPOFOL N/A 02/18/2014   Procedure: ESOPHAGOGASTRODUODENOSCOPY (EGD) WITH PROPOFOL;  Surgeon: Beryle Beams, MD;  Location: [facility];  Service: Endoscopy;  Laterality: N/A;  . ESOPHAGOGASTRODUODENOSCOPY (EGD) WITH PROPOFOL N/A 04/22/2014   Procedure: ESOPHAGOGASTRODUODENOSCOPY (EGD) WITH PROPOFOL;  Surgeon: Beryle Beams, MD;  Location: [facility];  Service:  Endoscopy;  Laterality: N/A;  . NASAL SEPTUM SURGERY  09/2016  . [place] IMPEDANCE STUDY N/A 09/04/2015   Procedure: [place] IMPEDANCE STUDY;  Surgeon: Carol Ada, MD;  Location: [facility];  Service: Endoscopy;  Laterality: N/A;  . REPLACEMENT TOTAL KNEE Right 21yrs ago  . TOTAL KNEE ARTHROPLASTY Left 11/30/2012   Procedure: LEFT TOTAL KNEE ARTHROPLASTY;  Surgeon: Kerin Salen, MD;  Location: [facility];  Service: Orthopedics;   Laterality: Left;    Social History   Tobacco Use  . Smoking status: Never Smoker  . Smokeless tobacco: Never Used  Substance Use Topics  . Alcohol use: No  . Drug use: No    Family History  Problem Relation Age of Onset  . Arthritis Mother   . Cancer Mother        breast  . Heart disease Mother        died from "heart problems" at age 46, had CAD  . Depression Mother   . Heart disease Father        had CABG  . Kidney disease Father   . Arthritis Sister   . Heart murmur Sister   . Arthritis Brother        "serious back/neck problems"  . Arthritis Sister   . Aneurysm Sister     Allergies  Allergen Reactions  . Naratriptan     Ineffective    Medication list has been reviewed and updated.  Current Outpatient Medications on File Prior to Visit  Medication Sig Dispense Refill  . Aspirin-Salicylamide-Caffeine (BC HEADACHE PO) Take 1 packet by mouth daily as needed (headaches).    . cyclobenzaprine (FLEXERIL) 10 MG tablet Take 1 tablet (10 mg total) by mouth at bedtime as needed for muscle spasms. 21 tablet 0  . dexlansoprazole (DEXILANT) 60 MG capsule Take 1 capsule (60 mg total) by mouth daily. 30 capsule 2  . eletriptan (RELPAX) 40 MG tablet TAKE 1 TABLET BY MOUTH AS NEEDED FOR MIGRAINE OF HEADACHE. MAY REPEAT IN 2 HOURS IF HEADACHE PERSISTS OR RECURS 10 tablet 5  . escitalopram (LEXAPRO) 20 MG tablet TAKE 1 TABLET(20 MG) BY MOUTH DAILY 90 tablet 1  . RESTASIS 0.05 % ophthalmic emulsion Place 1 drop into both eyes 2 (two) times daily.     . sucralfate (CARAFATE) 1 g tablet Take 1 g by mouth 4 (four) times daily.    Marland Kitchen topiramate (TOPAMAX) 100 MG tablet TAKE 1 TABLET(100 MG) BY MOUTH TWICE DAILY 180 tablet 0  . traMADol (ULTRAM) 50 MG tablet Take 1 tablet (50 mg total) by mouth every 8 (eight) hours as needed. 15 tablet 0   No current facility-administered medications on file prior to visit.     Review of Systems:  As per HPI- otherwise negative.  No vomiting or  diarrhea Physical Examination: Vitals:   09/17/18 1521 09/17/18 1537  BP: (!) 150/98 136/88  Pulse: 96   Resp: 16   Temp: (!) 97.5 F (36.4 C)   SpO2: 97%    Vitals:   09/17/18 1521  Weight: 136 lb (61.7 kg)  Height: 5\' 4"  (1.626 m)   Body mass index is 23.34 kg/m. Ideal Body Weight: Weight in (lb) to have BMI = 25: 145.3  GEN: WDWN, NAD, Non-toxic, A & O x 3 normal weight, looks well HEENT: Atraumatic, Normocephalic. Neck supple. No masses, No LAD.   Bilateral TM wnl, oropharynx normal.  PEERL,EOMI.   Nasal cavity is congested Frontal sinus tenderness to percussion  Ears and Nose:  No external deformity. CV: RRR, No M/G/R. No JVD. No thrill. No extra heart sounds. PULM: CTA B, no wheezes, crackles, rhonchi. No retractions. No resp. distress. No accessory muscle use. EXTR: No c/c/e NEURO Normal gait.  PSYCH: Normally interactive. Conversant. Not depressed or anxious appearing.  Calm demeanor.    Assessment and Plan: Acute non-recurrent frontal sinusitis - Plan: amoxicillin (AMOXIL) 500 MG capsule  Here today with symptoms of a sinus infection for several weeks.  Will treat with amoxicillin Have asked her to let me know if not feeling better the next few days, she agrees and feels comfortable with our current plan  She has used amoxicillin multiple times in the recent past, and has not had any seizure activity with this medication She used it most recently in April of this year  Signed Lamar Blinks, MD

## 2018-09-17 ENCOUNTER — Encounter: Payer: Self-pay | Admitting: Family Medicine

## 2018-09-17 ENCOUNTER — Ambulatory Visit (INDEPENDENT_AMBULATORY_CARE_PROVIDER_SITE_OTHER): Payer: Medicare Other | Admitting: Family Medicine

## 2018-09-17 VITALS — BP 136/88 | HR 96 | Temp 97.5°F | Resp 16 | Ht 64.0 in | Wt 136.0 lb

## 2018-09-17 DIAGNOSIS — J0110 Acute frontal sinusitis, unspecified: Secondary | ICD-10-CM

## 2018-09-17 MED ORDER — AMOXICILLIN 500 MG PO CAPS: 1000.0000 mg | ORAL_CAPSULE | Freq: Two times a day (BID) | ORAL | 0 refills | Status: DC

## 2018-09-17 NOTE — Patient Instructions (Signed)
I agree that he is in a sinus infection. We will treat this with amoxicillin, take it twice a day for 10 days. Otherwise I recommend hydration, rest, and use of over-the-counter medications as needed. Please let us know if you are not feeling better in the next few days, sooner if you start getting worse

## 2018-10-20 ENCOUNTER — Ambulatory Visit: Payer: Medicare Other | Admitting: Family

## 2018-10-21 ENCOUNTER — Ambulatory Visit (INDEPENDENT_AMBULATORY_CARE_PROVIDER_SITE_OTHER): Payer: Medicare Other | Admitting: Family

## 2018-10-21 ENCOUNTER — Encounter: Payer: Self-pay | Admitting: Family

## 2018-10-21 VITALS — BP 110/71 | HR 83 | Temp 98.3°F | Resp 16 | Ht 64.0 in | Wt 131.0 lb

## 2018-10-21 DIAGNOSIS — K5900 Constipation, unspecified: Secondary | ICD-10-CM | POA: Diagnosis not present

## 2018-10-21 DIAGNOSIS — F329 Major depressive disorder, single episode, unspecified: Secondary | ICD-10-CM

## 2018-10-21 DIAGNOSIS — G8929 Other chronic pain: Secondary | ICD-10-CM

## 2018-10-21 DIAGNOSIS — M546 Pain in thoracic spine: Secondary | ICD-10-CM | POA: Diagnosis not present

## 2018-10-21 DIAGNOSIS — F32A Depression, unspecified: Secondary | ICD-10-CM

## 2018-10-21 MED ORDER — CYCLOBENZAPRINE HCL 5 MG PO TABS: 5.0000 mg | ORAL_TABLET | Freq: Every evening | ORAL | 0 refills | Status: DC | PRN

## 2018-10-21 NOTE — Progress Notes (Signed)
Subjective:    Patient ID: Lindsay Frost, female    DOB: 08-26-1956, 63 y.o.   MRN: [id number]  HPI   Patient is a 63 yr old female who presents today with two concerns:  1) Back pain-patient is followed by orthopedics. She reports that she had 4 ESI's which did not help at all.  Reports that her pain is intermittent but can be "horrible" at times.  Last weekend she had a lot of pain which kept her in the bed.    2) Constipation- reports only one bm a week.    3) Depression- maintained on lexapro 20mg .  Reports that mood is stable.  Review of Systems    see HPI  Past Medical History:  Diagnosis Date  . Arthritis   . Back pain    arthritis  . Depression    takes cymbalta daily  . GERD (gastroesophageal reflux disease)    takes Omeprazole daily  . Headache(784.0)    migraines  . Heart murmur   . History of gastric ulcer   . History of migraine    last one about 2wks ago-takes Imitrex prn  . Joint pain   . Joint swelling   . Meningioma ([place])   . Ulcer      Social History   Socioeconomic History  . Marital status: Single    Spouse name: Not on file  . Number of children: Not on file  . Years of education: Not on file  . Highest education level: Not on file  Occupational History  . Not on file  Social Needs  . Financial resource strain: Not on file  . Food insecurity:    Worry: Not on file    Inability: Not on file  . Transportation needs:    Medical: Not on file    Non-medical: Not on file  Tobacco Use  . Smoking status: Never Smoker  . Smokeless tobacco: Never Used  Substance and Sexual Activity  . Alcohol use: No  . Drug use: No  . Sexual activity: Not Currently    Birth control/protection: Post-menopausal  Lifestyle  . Physical activity:    Days per week: Not on file    Minutes per session: Not on file  . Stress: Not on file  Relationships  . Social connections:    Talks on phone: Not on file    Gets together: Not on file    Attends  religious service: Not on file    Active member of club or organization: Not on file    Attends meetings of clubs or organizations: Not on file    Relationship status: Not on file  . Intimate partner violence:    Fear of current or ex partner: Not on file    Emotionally abused: Not on file    Physically abused: Not on file    Forced sexual activity: Not on file  Other Topics Concern  . 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, mining   Single   1 daughter- lives in [place]   2 dogs   Enjoys reading, watching television    Past Surgical History:  Procedure Laterality Date  . BALLOON DILATION  04/03/2012   Procedure: BALLOON DILATION;  Surgeon: Beryle Beams, MD;  Location: [facility];  Service: Endoscopy;  Laterality: N/A;  . CHOLECYSTECTOMY N/A 04/24/2018   Procedure: LAPAROSCOPIC CHOLECYSTECTOMY;  Surgeon: Clovis Riley, MD;  Location: [facility];  Service: General;  Laterality: N/A;  . COLONOSCOPY    . DIAGNOSTIC LAPAROSCOPY  14yrs ago  . ESOPHAGEAL MANOMETRY N/A 09/04/2015   Procedure: ESOPHAGEAL MANOMETRY (EM);  Surgeon: Carol Ada, MD;  Location: [facility];  Service: Endoscopy;  Laterality: N/A;  . ESOPHAGOGASTRODUODENOSCOPY  04/03/2012   Procedure: ESOPHAGOGASTRODUODENOSCOPY (EGD);  Surgeon: Beryle Beams, MD;  Location: [facility];  Service: Endoscopy;  Laterality: N/A;  EGD w/ balloon dilation  . ESOPHAGOGASTRODUODENOSCOPY (EGD) WITH PROPOFOL N/A 02/18/2014   Procedure: ESOPHAGOGASTRODUODENOSCOPY (EGD) WITH PROPOFOL;  Surgeon: Beryle Beams, MD;  Location: [facility];  Service: Endoscopy;  Laterality: N/A;  . ESOPHAGOGASTRODUODENOSCOPY (EGD) WITH PROPOFOL N/A 04/22/2014   Procedure: ESOPHAGOGASTRODUODENOSCOPY (EGD) WITH PROPOFOL;  Surgeon: Beryle Beams, MD;  Location: [facility];  Service: Endoscopy;  Laterality: N/A;  . NASAL SEPTUM SURGERY  09/2016  . [place] IMPEDANCE STUDY N/A 09/04/2015   Procedure: [place]  IMPEDANCE STUDY;  Surgeon: Carol Ada, MD;  Location: [facility];  Service: Endoscopy;  Laterality: N/A;  . REPLACEMENT TOTAL KNEE Right 19yrs ago  . TOTAL KNEE ARTHROPLASTY Left 11/30/2012   Procedure: LEFT TOTAL KNEE ARTHROPLASTY;  Surgeon: Kerin Salen, MD;  Location: [facility];  Service: Orthopedics;  Laterality: Left;    Family History  Problem Relation Age of Onset  . Arthritis Mother   . Cancer Mother        breast  . Heart disease Mother        died from "heart problems" at age 79, had CAD  . Depression Mother   . Heart disease Father        had CABG  . Kidney disease Father   . Arthritis Sister   . Heart murmur Sister   . Arthritis Brother        "serious back/neck problems"  . Arthritis Sister   . Aneurysm Sister     Allergies  Allergen Reactions  . Naratriptan     Ineffective    Current Outpatient Medications on File Prior to Visit  Medication Sig Dispense Refill  . Aspirin-Salicylamide-Caffeine (BC HEADACHE PO) Take 1 packet by mouth daily as needed (headaches).    . cyclobenzaprine (FLEXERIL) 10 MG tablet Take 1 tablet (10 mg total) by mouth at bedtime as needed for muscle spasms. 21 tablet 0  . dexlansoprazole (DEXILANT) 60 MG capsule Take 1 capsule (60 mg total) by mouth daily. 30 capsule 2  . eletriptan (RELPAX) 40 MG tablet TAKE 1 TABLET BY MOUTH AS NEEDED FOR MIGRAINE OF HEADACHE. MAY REPEAT IN 2 HOURS IF HEADACHE PERSISTS OR RECURS 10 tablet 5  . escitalopram (LEXAPRO) 20 MG tablet TAKE 1 TABLET(20 MG) BY MOUTH DAILY 90 tablet 1  . RESTASIS 0.05 % ophthalmic emulsion Place 1 drop into both eyes 2 (two) times daily.     . sucralfate (CARAFATE) 1 g tablet Take 1 g by mouth 4 (four) times daily.    Marland Kitchen topiramate (TOPAMAX) 100 MG tablet TAKE 1 TABLET(100 MG) BY MOUTH TWICE DAILY 180 tablet 0  . traMADol (ULTRAM) 50 MG tablet Take 1 tablet (50 mg total) by mouth every 8 (eight) hours as needed. (Patient not taking: Reported on 10/21/2018) 15 tablet 0   No current  facility-administered medications on file prior to visit.     BP 110/71 (BP Location: Left Arm, Patient Position: Sitting, Cuff Size: Small)   Pulse 83   Temp 98.3 F (36.8 C) (Oral)   Resp 16   Ht 5\' 4"  (1.626 m)   Wt  131 lb (59.4 kg)   SpO2 98%   BMI 22.49 kg/m    Objective:   Physical Exam Constitutional:      Appearance: She is well-developed.  Neck:     Musculoskeletal: Neck supple.     Thyroid: No thyromegaly.  Cardiovascular:     Rate and Rhythm: Normal rate and regular rhythm.     Heart sounds: Normal heart sounds. No murmur.  Pulmonary:     Effort: Pulmonary effort is normal. No respiratory distress.     Breath sounds: Normal breath sounds. No wheezing.  Abdominal:     General: There is no distension.     Palpations: Abdomen is soft.  Skin:    General: Skin is warm and dry.  Neurological:     Mental Status: She is alert and oriented to person, place, and time.  Psychiatric:        Behavior: Behavior normal.        Thought Content: Thought content normal.        Judgment: Judgment normal.           Assessment & Plan:  Depression- stable on lexapro.  Back pain- uncontrolled. Rx provided for flexeril. Recommended prn tylenol and follow up with orthopedics.  Constipation- reports GI was waiting on her records from Dr. Benson Norway before they would schedule. States Dr. Ulyses Amor office sent records but they never called. Will re-initiate referral and pt was given contact number for their office.

## 2018-10-21 NOTE — Patient Instructions (Addendum)
Please schedule a follow up appointment with Dr. Damita Dunnings office. You should be contacted about your referral to Gastroenterology. Their number is:  [phone]

## 2018-11-07 IMAGING — DX DG THORACIC SPINE 2V
3 series · 3 of 3 positions shown · non-contrast
Comparison: 04/22/2017 chest x-ray.

CLINICAL DATA: 61-year-old female with mid and lower back pain for
months. No injury. Initial encounter.

EXAM:
THORACIC SPINE 2 VIEWS

[t-spine ap]
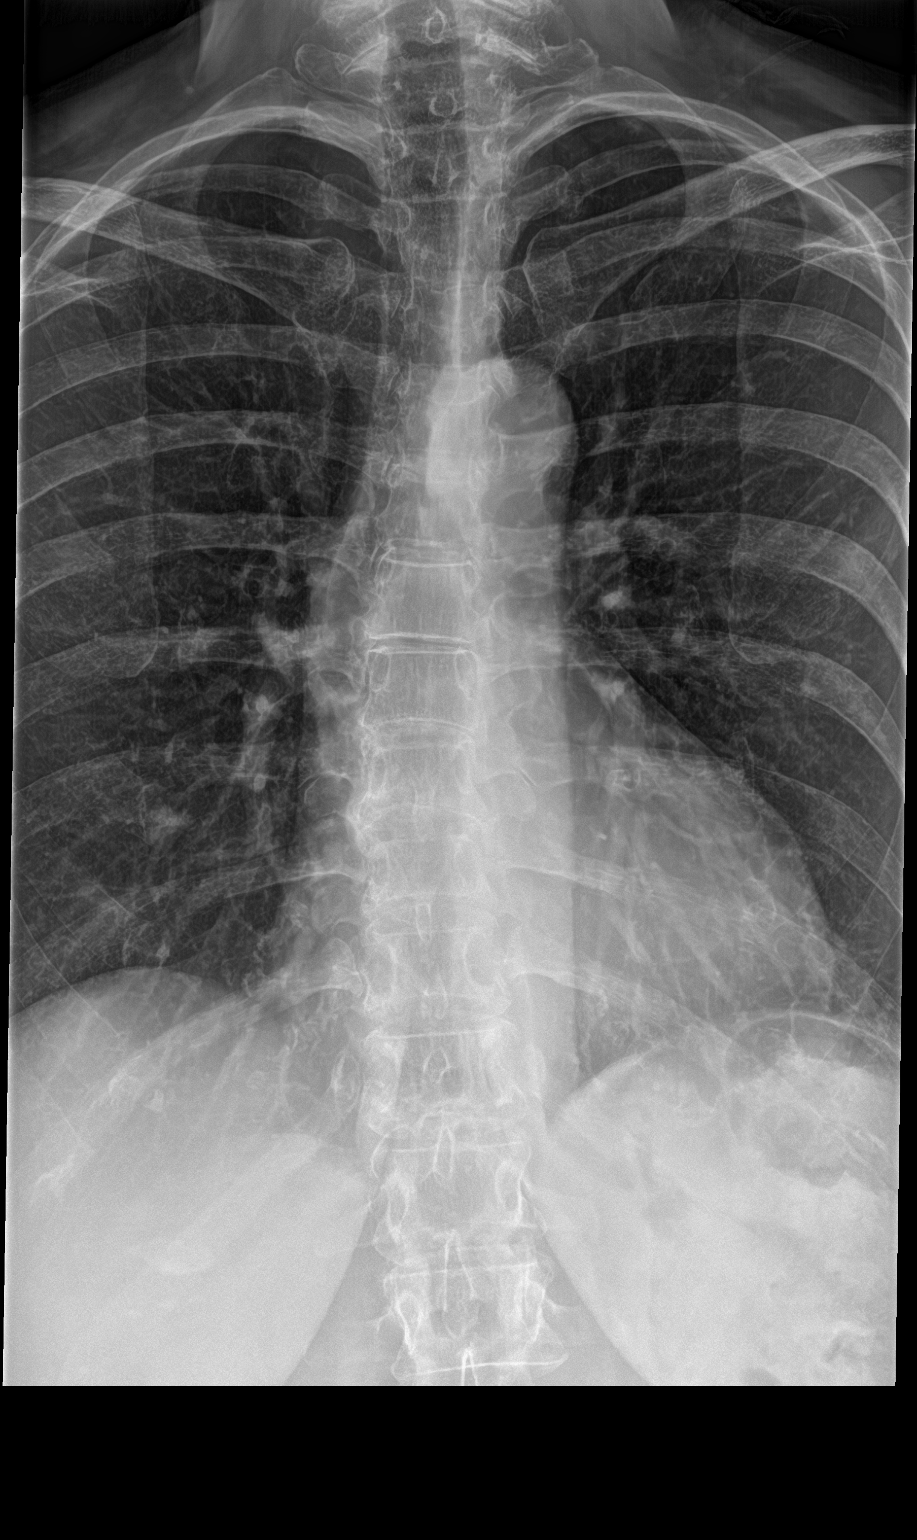

[t-spine lat]
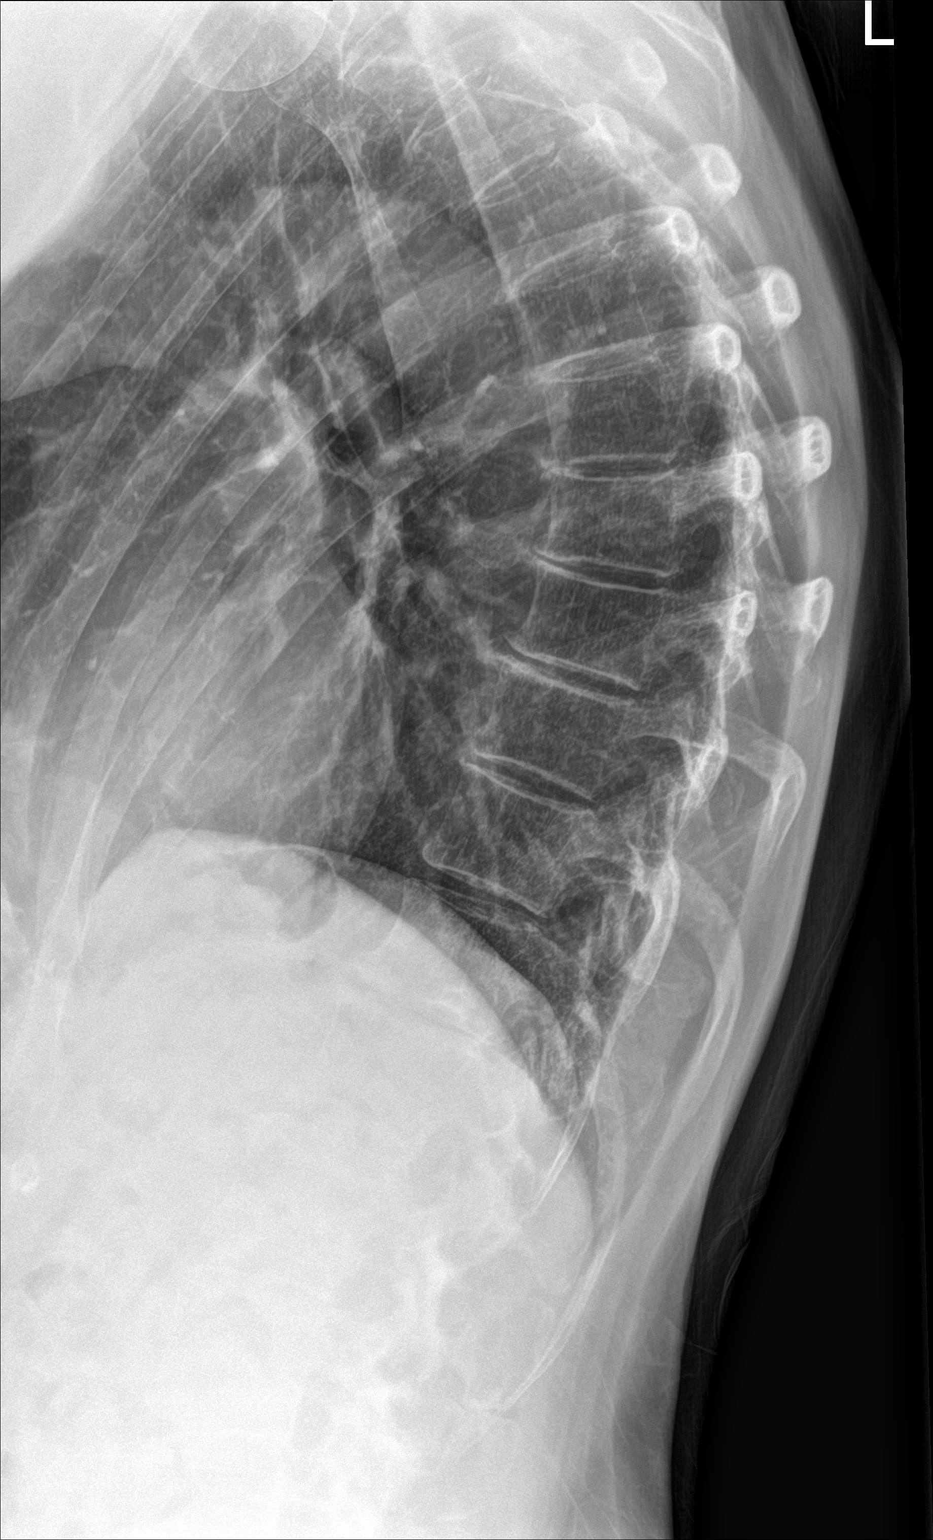

[t-spine swimmers]
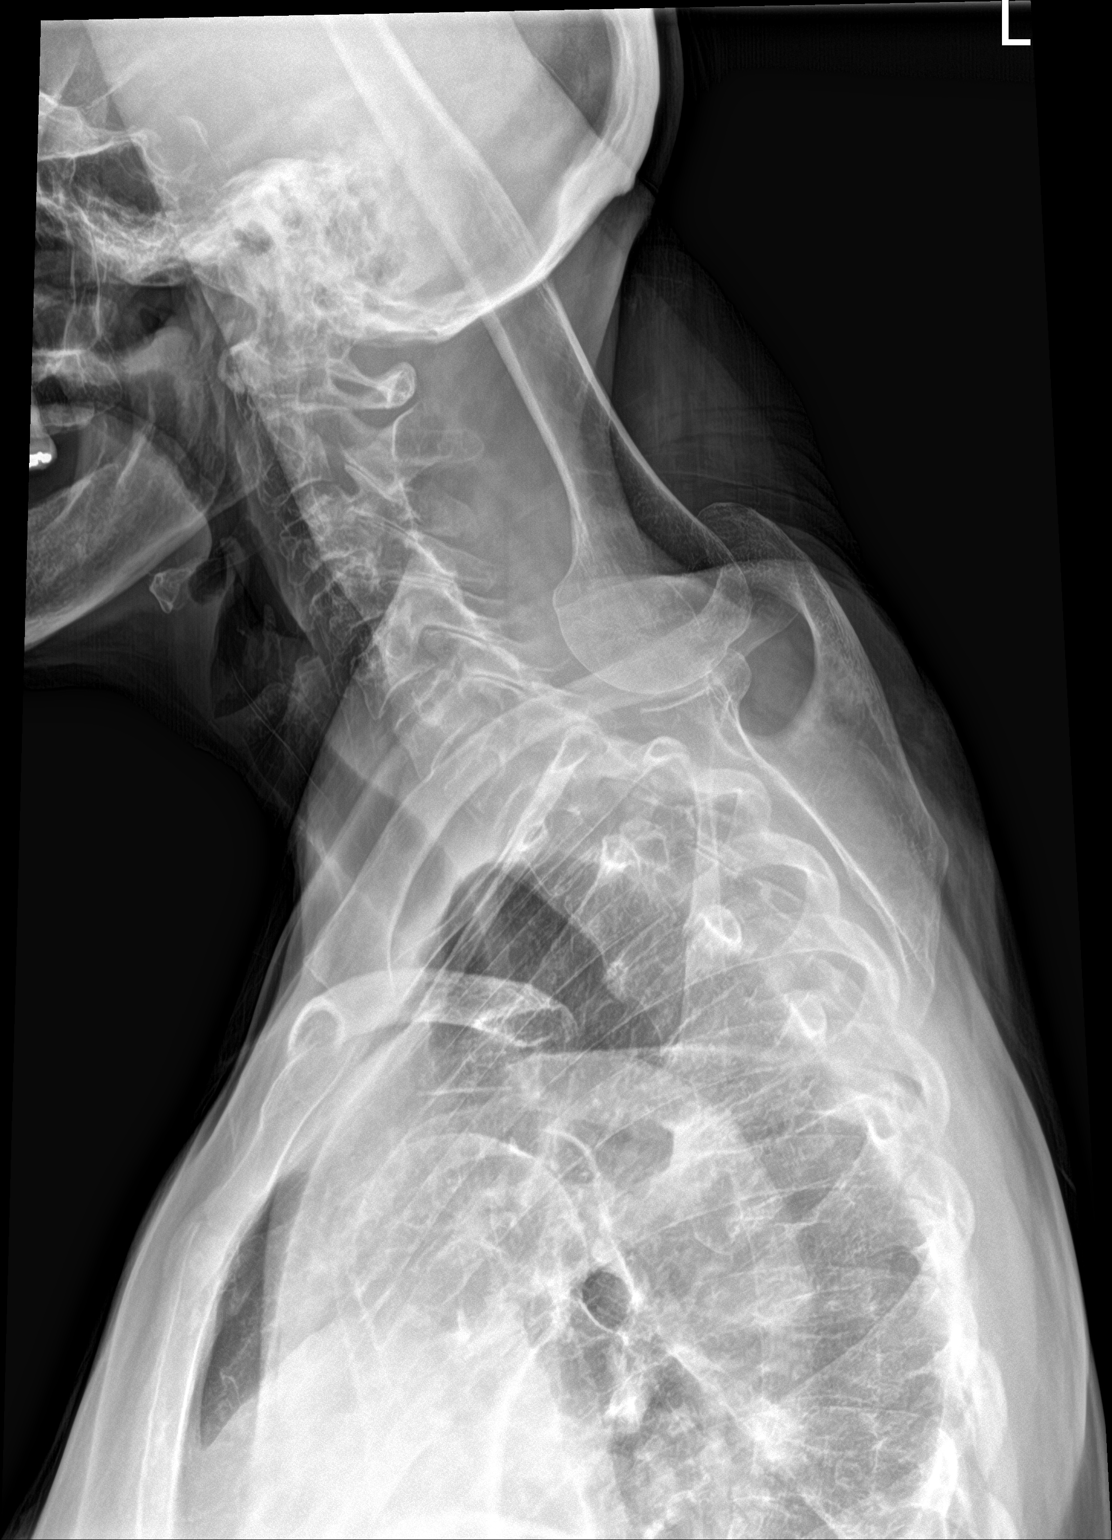

[3 of 3 positions shown; findings below may reference images not displayed]

FINDINGS: Mild curvature thoracic spine convex right.

Mild degenerative changes throughout the thoracic spine most notable
T12-L1.

No compression fracture.
IMPRESSION: Mild degenerative changes throughout the thoracic spine most notable
T12-L1.

No compression fracture.

## 2018-11-19 ENCOUNTER — Other Ambulatory Visit: Payer: Self-pay | Admitting: Neurology

## 2018-11-24 DIAGNOSIS — H353130 Nonexudative age-related macular degeneration, bilateral, stage unspecified: Secondary | ICD-10-CM | POA: Diagnosis not present

## 2018-11-24 DIAGNOSIS — H2513 Age-related nuclear cataract, bilateral: Secondary | ICD-10-CM | POA: Diagnosis not present

## 2018-11-24 DIAGNOSIS — H04123 Dry eye syndrome of bilateral lacrimal glands: Secondary | ICD-10-CM | POA: Diagnosis not present

## 2018-11-24 DIAGNOSIS — H0102A Squamous blepharitis right eye, upper and lower eyelids: Secondary | ICD-10-CM | POA: Diagnosis not present

## 2018-11-24 DIAGNOSIS — H0102B Squamous blepharitis left eye, upper and lower eyelids: Secondary | ICD-10-CM | POA: Diagnosis not present

## 2018-11-26 ENCOUNTER — Encounter: Payer: Self-pay | Admitting: Family Medicine

## 2018-12-18 ENCOUNTER — Other Ambulatory Visit: Payer: Self-pay | Admitting: Neurology

## 2019-01-18 ENCOUNTER — Ambulatory Visit (INDEPENDENT_AMBULATORY_CARE_PROVIDER_SITE_OTHER): Payer: Medicare Other | Admitting: Family

## 2019-01-18 ENCOUNTER — Telehealth: Payer: Self-pay

## 2019-01-18 DIAGNOSIS — J309 Allergic rhinitis, unspecified: Secondary | ICD-10-CM | POA: Diagnosis not present

## 2019-01-18 DIAGNOSIS — J019 Acute sinusitis, unspecified: Secondary | ICD-10-CM | POA: Diagnosis not present

## 2019-01-18 MED ORDER — AMOXICILLIN-POT CLAVULANATE 875-125 MG PO TABS: 1.0000 | ORAL_TABLET | Freq: Two times a day (BID) | ORAL | 0 refills | Status: DC

## 2019-01-18 NOTE — Progress Notes (Signed)
Virtual Visit via Video Note  I connected with Lindsay Frost on 01/18/19 at 11:40 AM EDT by a video enabled telemedicine application and verified that I am speaking with the correct person using two identifiers.  Location: Patient: home Provider: home   I discussed the limitations of evaluation and management by telemedicine and the availability of in person appointments. The patient expressed understanding and agreed to proceed.  History of Present Illness:  Patient reports a 1 month history of sinus congestion. Reports that she has been using ibuprofen and robitussin. Reports + sinus pressure.  Reports some yellow nasal drainage and also having some mucous with blood at times.  She denies known fever. She also reports a 1 month hx of cough which is worse at night.  Reports that she is sleeping propped up with helps. Attributes cough to sinus drainage. She reports that she does get seasonal allergies.   Observations/Objective:  Gen: Awake, alert, no acute distress Resp: Breathing is even and non-labored Psych: calm/pleasant demeanor Neuro: Alert and Oriented x 3, + facial symmetry, speech is clear.   Assessment and Plan:  Sinusitis- New. Will rx with augmentin.   Allergic rhinitis- I think this is contributing to her cough. Advised pt to add trial of claritin.   Follow Up Instructions:  Pt advised to call if new/worsening symptoms or if symptoms fail to improve.   I discussed the assessment and treatment plan with the patient. The patient was provided an opportunity to ask questions and all were answered. The patient agreed with the plan and demonstrated an understanding of the instructions.   The patient was advised to call back or seek an in-person evaluation if the symptoms worsen or if the condition fails to improve as anticipated.    [name], NP

## 2019-01-18 NOTE — Telephone Encounter (Signed)
Patient needs telephone visit for nosebleeds,coughing,headache, nasal congestion. Coughing up light green mucus. Appointment scheduled.

## 2019-01-18 NOTE — Telephone Encounter (Signed)
Copied from [place] [id number]. Topic: Appointment Scheduling - Scheduling Inquiry for Clinic >> Jan 18, 2019  9:17 AM Virl Axe D wrote: Reason for CRM: Pt called to schedule appointment with Melissa. No answer on scheduling line. Please reach out to pt. FO#[phone]

## 2019-01-25 ENCOUNTER — Telehealth: Payer: Self-pay

## 2019-01-25 NOTE — Telephone Encounter (Signed)
Copied from [place] [id number]. Topic: General - Inquiry >> Jan 25, 2019 12:19 PM Virl Axe D wrote: Reason for CRM: Pt called and stated she has been exposed to mold in her apartment. She would like to know if Melissa can send orders to the lab for her to be tested. Please advise. HH#[phone]

## 2019-01-26 ENCOUNTER — Encounter: Payer: Self-pay | Admitting: Family

## 2019-01-26 ENCOUNTER — Other Ambulatory Visit: Payer: Self-pay

## 2019-01-26 ENCOUNTER — Ambulatory Visit (INDEPENDENT_AMBULATORY_CARE_PROVIDER_SITE_OTHER): Payer: Medicare Other | Admitting: Family

## 2019-01-26 VITALS — Wt 131.0 lb

## 2019-01-26 DIAGNOSIS — Z77120 Contact with and (suspected) exposure to mold (toxic): Secondary | ICD-10-CM

## 2019-01-26 NOTE — Progress Notes (Signed)
Virtual Visit via phone Note  I connected with Lindsay Frost on 01/26/19 at  2:20 PM EDT by a video enabled telemedicine application and verified that I am speaking with the correct person using two identifiers. This visit type was conducted due to national recommendations for restrictions regarding the COVID-19 Pandemic (e.g. social distancing).  (patient also did not have access to video). This format is felt to be most appropriate for this patient at this time.   I discussed the limitations of evaluation and management by telemedicine and the availability of in person appointments. The patient expressed understanding and agreed to proceed.  Only the patient and myself were on today's video visit. The patient was at home and I was in my office at the time of today's visit.   History of Present Illness:  Patient is a 63 yr old female who presents today with chief complaint of "mold" in her apartment.  Reports that she has had water leaks from the apartment above her in the bathroom. Then when they took out the ceiling all this "crummy mold came out." Reports it was not professionally mediated, was just boarded up and painted over. Reports that she has had multiple nose bleeds and sinus infections and wonders "if you can test me to see how much mold I have taken into my lungs."     Observations/Objective:   Gen: Awake, alert, no acute distress Resp: Breathing sounds even and non-labored Psych: calm/pleasant demeanor Neuro: Alert and Oriented x 3, + facial symmetry, speech is clear.   Assessment and Plan:  Mold exposure- I advised pt that there is not a medical test to measure the extent of her mold exposure. I advised her that there are commercial testing that she can seek out to come test her apartment for mold. She could either reach out to her landlord to request testing or she can seek private testing. Advised her that we also could refer her to an allergist to do formal testing to see if  she is allergic to mold.  She verbalizes understanding. Notes that she was just hoping that we could test her. She does not desire any additional referrals at this time.   Follow Up Instructions:    I discussed the assessment and treatment plan with the patient. The patient was provided an opportunity to ask questions and all were answered. The patient agreed with the plan and demonstrated an understanding of the instructions.   The patient was advised to call back or seek an in-person evaluation if the symptoms worsen or if the condition fails to improve as anticipated.    Lindsay Pear, NP

## 2019-01-26 NOTE — Telephone Encounter (Signed)
Scheduled for telephone visit this afternoon with [facility]

## 2019-02-01 ENCOUNTER — Telehealth: Payer: Medicare Other | Admitting: Family

## 2019-02-01 DIAGNOSIS — J301 Allergic rhinitis due to pollen: Secondary | ICD-10-CM | POA: Diagnosis not present

## 2019-02-01 MED ORDER — FLUTICASONE PROPIONATE 50 MCG/ACT NA SUSP: 2.0000 | Freq: Every day | NASAL | 6 refills | Status: DC

## 2019-02-01 NOTE — Progress Notes (Signed)
E visit for Allergic Rhinitis We are sorry that you are not feeling well.  Here is how we plan to help!  Based on what you have shared with me it looks like you have Allergic Rhinitis.  Rhinitis is when a reaction occurs that causes nasal congestion, runny nose, sneezing, and itching.  Most types of rhinitis are caused by an inflammation and are associated with symptoms in the eyes ears or throat. There are several types of rhinitis.  The most common are acute rhinitis, which is usually caused by a viral illness, allergic or seasonal rhinitis, and nonallergic or year-round rhinitis.  Nasal allergies occur certain times of the year.  Allergic rhinitis is caused when allergens in the air trigger the release of histamine in the body.  Histamine causes itching, swelling, and fluid to build up in the fragile linings of the nasal passages, sinuses and eyelids.  An itchy nose and clear discharge are common.   Approximately 5 minutes was spent documenting and reviewing patient's chart.    I recommend the following over the counter treatments: You should take a daily dose of antihistamine and Xyzal 5 mg take 1 tablet daily  I also would recommend a nasal spray: Flonase 2 sprays into each nostril once daily  You may also benefit from eye drops such as: Visine 1-2 drops each eye twice daily as needed  HOME CARE:   You can use an over-the-counter saline nasal spray as needed  Avoid areas where there is heavy dust, mites, or molds  Stay indoors on windy days during the pollen season  Keep windows closed in home, at least in bedroom; use air conditioner.  Use high-efficiency house air filter  Keep windows closed in car, turn AC on re-circulate  Avoid playing out with dog during pollen season  GET HELP RIGHT AWAY IF:   If your symptoms do not improve within 10 days  You become short of breath  You develop yellow or green discharge from your nose for over 3 days  You have coughing  fits  MAKE SURE YOU:   Understand these instructions  Will watch your condition  Will get help right away if you are not doing well or get worse  Thank you for choosing an e-visit. Your e-visit answers were reviewed by a board certified advanced clinical practitioner to complete your personal care plan. Depending upon the condition, your plan could have included both over the counter or prescription medications. Please review your pharmacy choice. Be sure that the pharmacy you have chosen is open so that you can pick up your prescription now.  If there is a problem you may message your provider in [place] to have the prescription routed to another pharmacy. Your safety is important to [place]. If you have drug allergies check your prescription carefully.  For the next 24 hours, you can use MyChart to ask questions about today's visit, request a non-urgent call back, or ask for a work or school excuse from your e-visit provider. You will get an email in the next two days asking about your experience. I hope that your e-visit has been valuable and will speed your recovery.

## 2019-02-16 ENCOUNTER — Other Ambulatory Visit: Payer: Self-pay

## 2019-02-16 ENCOUNTER — Ambulatory Visit (INDEPENDENT_AMBULATORY_CARE_PROVIDER_SITE_OTHER): Payer: Medicare Other | Admitting: Family

## 2019-02-16 DIAGNOSIS — J329 Chronic sinusitis, unspecified: Secondary | ICD-10-CM

## 2019-02-16 DIAGNOSIS — Z77120 Contact with and (suspected) exposure to mold (toxic): Secondary | ICD-10-CM | POA: Diagnosis not present

## 2019-02-16 NOTE — Progress Notes (Signed)
Virtual Visit via Telephone Note  I connected with Lindsay Frost on 02/16/19 at  1:20 PM EDT by telephone and verified that I am speaking with the correct person using two identifiers.  Location: Patient: home Provider: home   I discussed the limitations, risks, security and privacy concerns of performing an evaluation and management service by telephone and the availability of in person appointments. I also discussed with the patient that there may be a patient responsible charge related to this service. The patient expressed understanding and agreed to proceed.   History of Present Illness:  Patient is a 63 yr old female who presents today to discuss concerns about mold exposure. We saw her back on 01/26/19 with the same concerns.   Today she reports that she is having a "really bad ear ache, sinus problems, coughing up mucous." Feels off balance, "like I have fluid in my ears." Feels "like I have another sinus infection going on. Denies fever.  She reports that augmentin helped for a little while then it seemed to come back. Reports that she is using flonase.    States that she wants testing to show her landlord that she her recurrent sinus infections are due to the mold in her apartment.   Past Medical History:  Diagnosis Date  . Arthritis   . Back pain    arthritis  . Depression    takes cymbalta daily  . GERD (gastroesophageal reflux disease)    takes Omeprazole daily  . Headache(784.0)    migraines  . Heart murmur   . History of gastric ulcer   . History of migraine    last one about 2wks ago-takes Imitrex prn  . Joint pain   . Joint swelling   . Meningioma ([place])   . Ulcer      Social History   Socioeconomic History  . Marital status: Single    Spouse name: Not on file  . Number of children: Not on file  . Years of education: Not on file  . Highest education level: Not on file  Occupational History  . Not on file  Social Needs  . Financial resource strain:  Not on file  . Food insecurity:    Worry: Not on file    Inability: Not on file  . Transportation needs:    Medical: Not on file    Non-medical: Not on file  Tobacco Use  . Smoking status: Never Smoker  . Smokeless tobacco: Never Used  Substance and Sexual Activity  . Alcohol use: No  . Drug use: No  . Sexual activity: Not Currently    Birth control/protection: Post-menopausal  Lifestyle  . Physical activity:    Days per week: Not on file    Minutes per session: Not on file  . Stress: Not on file  Relationships  . Social connections:    Talks on phone: Not on file    Gets together: Not on file    Attends religious service: Not on file    Active member of club or organization: Not on file    Attends meetings of clubs or organizations: Not on file    Relationship status: Not on file  . Intimate partner violence:    Fear of current or ex partner: Not on file    Emotionally abused: Not on file    Physically abused: Not on file    Forced sexual activity: Not on file  Other Topics Concern  . 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 BALLOON DILATION  04/03/2012   Procedure: BALLOON DILATION;  Surgeon: Beryle Beams, MD;  Location: [facility];  Service: Endoscopy;  Laterality: N/A;  . CHOLECYSTECTOMY N/A 04/24/2018   Procedure: LAPAROSCOPIC CHOLECYSTECTOMY;  Surgeon: Clovis Riley, MD;  Location: [facility];  Service: General;  Laterality: N/A;  . COLONOSCOPY    . DIAGNOSTIC LAPAROSCOPY  56yrs ago  . ESOPHAGEAL MANOMETRY N/A 09/04/2015   Procedure: ESOPHAGEAL MANOMETRY (EM);  Surgeon: Carol Ada, MD;  Location: [facility];  Service: Endoscopy;  Laterality: N/A;  . ESOPHAGOGASTRODUODENOSCOPY  04/03/2012   Procedure: ESOPHAGOGASTRODUODENOSCOPY (EGD);  Surgeon:  Beryle Beams, MD;  Location: [facility];  Service: Endoscopy;  Laterality: N/A;  EGD w/ balloon dilation  . ESOPHAGOGASTRODUODENOSCOPY (EGD) WITH PROPOFOL N/A 02/18/2014   Procedure: ESOPHAGOGASTRODUODENOSCOPY (EGD) WITH PROPOFOL;  Surgeon: Beryle Beams, MD;  Location: [facility];  Service: Endoscopy;  Laterality: N/A;  . ESOPHAGOGASTRODUODENOSCOPY (EGD) WITH PROPOFOL N/A 04/22/2014   Procedure: ESOPHAGOGASTRODUODENOSCOPY (EGD) WITH PROPOFOL;  Surgeon: Beryle Beams, MD;  Location: [facility];  Service: Endoscopy;  Laterality: N/A;  . NASAL SEPTUM SURGERY  09/2016  . [place] IMPEDANCE STUDY N/A 09/04/2015   Procedure: [place] IMPEDANCE STUDY;  Surgeon: Carol Ada, MD;  Location: [facility];  Service: Endoscopy;  Laterality: N/A;  . REPLACEMENT TOTAL KNEE Right 31yrs ago  . TOTAL KNEE ARTHROPLASTY Left 11/30/2012   Procedure: LEFT TOTAL KNEE ARTHROPLASTY;  Surgeon: Kerin Salen, MD;  Location: [place];  Service: Orthopedics;  Laterality: Left;    Family History  Problem Relation Age of Onset  . Arthritis Mother   . Cancer Mother        breast  . Heart disease Mother        died from "heart problems" at age 39, had CAD  . Depression Mother   . Heart disease Father        had CABG  . Kidney disease Father   . Arthritis Sister   . Heart murmur Sister   . Arthritis Brother        "serious back/neck problems"  . Arthritis Sister   . Aneurysm Sister     Allergies  Allergen Reactions  . Naratriptan     Ineffective    Current Outpatient Medications on File Prior to Visit  Medication Sig Dispense Refill  . Aspirin-Salicylamide-Caffeine (BC HEADACHE PO) Take 1 packet by mouth daily as needed (headaches).    . cyclobenzaprine (FLEXERIL) 5 MG tablet Take 1 tablet (5 mg total) by mouth at bedtime as needed for muscle spasms. 15 tablet 0  . dexlansoprazole (DEXILANT) 60 MG capsule Take 1 capsule (60 mg total) by mouth daily. 30 capsule 2  . eletriptan (RELPAX) 40 MG tablet TAKE 1 TABLET BY  MOUTH AS NEEDED FOR MIGRAINE OF HEADACHE. MAY REPEAT IN 2 HOURS IF HEADACHE PERSISTS OR RECURS 10 tablet 5  . escitalopram (LEXAPRO) 20 MG tablet TAKE 1 TABLET(20 MG) BY MOUTH DAILY 90 tablet 1  . fluticasone (FLONASE) 50 MCG/ACT nasal spray Place 2 sprays into both nostrils daily. 16 g 6  . RESTASIS 0.05 % ophthalmic emulsion Place 1 drop into both eyes 2 (two) times daily.     . sucralfate (CARAFATE) 1 g tablet Take  1 g by mouth 4 (four) times daily.    Marland Kitchen topiramate (TOPAMAX) 100 MG tablet TAKE 1 TABLET(100 MG) BY MOUTH TWICE DAILY 180 tablet 1   No current facility-administered medications on file prior to visit.     There were no vitals taken for this visit.       Observations/Objective:   Gen: Awake, alert, no acute distress Resp: Breathing is even and non-labored Psych: calm/pleasant demeanor Neuro: Alert and Oriented x 3, + facial symmetry, speech is clear.   Assessment and Plan:  Mold exposure- I again reinforced with the patient that we can test her for mold allergy and she can have her apartment tested for mold but we are unable to tell her how much mold she has taken into her lungs. Offered a referral to allergist- she declines at this time.  Sinusitis- will rx with levaquin since she was recently treated with augmentin.  Advised pt that if her symptoms worsen or if they fail to improve we will need to send her to ENT for further evaluation. Pt verbalizes understanding.    I discussed the assessment and treatment plan with the patient. The patient was provided an opportunity to ask questions and all were answered. The patient agreed with the plan and demonstrated an understanding of the instructions.   The patient was advised to call back or seek an in-person evaluation if the symptoms worsen or if the condition fails to improve as anticipated.  I provided 15 minutes of non-face-to-face time during this encounter.   [name], NP

## 2019-03-09 NOTE — Progress Notes (Signed)
Virtual Visit via Telephone Note The purpose of this virtual visit is to provide medical care while limiting exposure to the novel coronavirus.    Consent was obtained for phone visit:  Yes Answered questions that patient had about telehealth interaction:  Yes I discussed the limitations, risks, security and privacy concerns of performing an evaluation and management service by telephone. I also discussed with the patient that there may be a patient responsible charge related to this service. The patient expressed understanding and agreed to proceed.  Pt location: Home Physician Location: office Name of referring provider:  Debbrah Alar, NP I connected with .Lindsay Frost at patients initiation/request on 03/10/2019 at 10:50 AM EDT by telephone and verified that I am speaking with the correct person using two identifiers.  Pt MRN:  [id number] Pt DOB:  03/08/1956   History of Present Illness: UPDATE: Migraine: Intensity:  Mild-moderate.  Duration:  3 to 4 hours however does not typically repeat Relpax Frequency:  2 days a month Over the past few weeks, has been waking up with a headache at times, related to hot weather and rain as well as emotional stress related to the pandemic. Current NSAIDS:  Ibuprofen Current analgesics:  Tylenol Current triptans:  Relpax Current ergotamine:  none Current anti-emetic:  none Current muscle relaxants:  Flexeril (for back pain) Current anti-anxiolytic:  none Current sleep aide:  none Current Antihypertensive medications:  none Current Antidepressant medications:  Lexapro 20mg  Current Anticonvulsant medications:  topiramate 100mg  twice daily Current anti-CGRP:  none Current Vitamins/Herbal/Supplements:  none Current Antihistamines/Decongestants:  Flonase Other therapy:  none  Caffeine:  cut out caffeine Alcohol:  no Smoker:  no Diet:  Some junk food.  Drinks flavored water.   Exercise:  Walks dog Depression:  no; Anxiety:  yes  Other pain:  Back pain.  Has an atypical spinal hemangioma as well. Sleep hygiene:  good  Seizures: No seizures. Last spell occurred 05/27/15.  Current preventative: topiramate 100mg  twice daily  She does report weight loss.  HISTORY: Migraine: Onset:  Early 40s Location:Left frontal Quality:Stabbing Initial Intensity:10 out of 10; September 7/10 Aura:No Prodrome:Photophobia Associated symptoms:  Nausea, phonophobia, photophobia, sometimes vomiting. No osmophobia, visual disturbance, or autonomic symptoms.No associated unilateral numbness or weakness. Initial Duration:2-3 days; September 3 hours with Relpax Initial Frequency:Migraines occur every 2 weeks (4-6 days per month) and dull headaches occur 2 times a week (8 days per month). Total of 14 headache days per month; September 3 times a month Triggers:  Heat Relieving factors:  Laying down in a quiet, dark, cool room with ice pack. Activity:Cannot function when she has migraine  Past abortive therapy:sumatriptan 50mg  (ineffective, caused racing heart), Maxalt (ineffective), BC powder Past preventative therapy: Cymbalta (for depression), nortriptyline 50mg  (discontinued in order to simplify medication list) Unable to start Inderal LA because insurance wouldn't cover it.  Family history of headache:daughter  Seizure: Her Wellbutrin was increased in August 2016 to help control her depression.At around that time, she had an episode where she woke up and found herself on the bathroom floor.She didn't remember falling.Later in the month, she had another episode where she woke up on the floor next to her bed and noted that she was briefly kicking both legs.She had a severe rug burn on the left side of her face.She did not bite her tongue during either event but she did have some urinary incontinence during the episode in the bathroom.Her PCP subsequently decreased her dose of Wellbutrin.She has  no prior  history of seizures.She had a sleep deprived EEG performed on 06/07/14, which showed brief intermittent transient sharp waves and slowing in the left temporal region, which although not epileptiform, could be a seizure focus.  On 05/27/15, she had a different spell. It was unwitnessed. When she woke up, she was short of breath and it took a few minutes to recover. Sometimes, she will wake up short of breath, but this episode was worse. She did not have incontinence or tongue biting.   MRI of brain with and without contrast from 08/19/13 demonstrated a 12 mm left parietal calcified meningioma without surrounding vasogenic edema, minimally increased compared to prior study from 10/2003. MRI of the brain with and without contrast was performed on 06/08/14, which demonstrated stability of the meningioma. 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  Past antiepileptic medication: Keppra  Past Medical History: Past Medical History:  Diagnosis Date  . Arthritis   . Back pain    arthritis  . Depression    takes cymbalta daily  . GERD (gastroesophageal reflux disease)    takes Omeprazole daily  . Headache(784.0)    migraines  . Heart murmur   . History of gastric ulcer   . History of migraine    last one about 2wks ago-takes Imitrex prn  . Joint pain   . Joint swelling   . Meningioma ([place])   . Ulcer     Medications: Outpatient Encounter Medications as of 03/10/2019  Medication Sig  . Aspirin-Salicylamide-Caffeine (BC HEADACHE PO) Take 1 packet by mouth daily as needed (headaches).  . cyclobenzaprine (FLEXERIL) 5 MG tablet Take 1 tablet (5 mg total) by mouth at bedtime as needed for muscle spasms.  Marland Kitchen dexlansoprazole (DEXILANT) 60 MG capsule Take 1 capsule (60 mg total) by mouth daily.  Marland Kitchen eletriptan (RELPAX) 40 MG tablet TAKE 1 TABLET BY MOUTH AS  NEEDED FOR MIGRAINE OF HEADACHE. MAY REPEAT IN 2 HOURS IF HEADACHE PERSISTS OR RECURS  . escitalopram (LEXAPRO) 20 MG tablet TAKE 1 TABLET(20 MG) BY MOUTH DAILY  . fluticasone (FLONASE) 50 MCG/ACT nasal spray Place 2 sprays into both nostrils daily.  . RESTASIS 0.05 % ophthalmic emulsion Place 1 drop into both eyes 2 (two) times daily.   . sucralfate (CARAFATE) 1 g tablet Take 1 g by mouth 4 (four) times daily.  Marland Kitchen topiramate (TOPAMAX) 100 MG tablet TAKE 1 TABLET(100 MG) BY MOUTH TWICE DAILY   No facility-administered encounter medications on file as of 03/10/2019.     Allergies: Allergies  Allergen Reactions  . Naratriptan     Ineffective    Family History: Family History  Problem Relation Age of Onset  . Arthritis Mother   . Cancer Mother        breast  . Heart disease Mother        died from "heart problems" at age 7, had CAD  . Depression Mother   . Heart disease Father        had CABG  . Kidney disease Father   . Arthritis Sister   . Heart murmur Sister   . Arthritis Brother        "serious back/neck problems"  . Arthritis Sister   . Aneurysm Sister     Social History: Social History   Socioeconomic History  . Marital status: Single    Spouse name: Not on file  . Number  of children: Not on file  . Years of education: Not on file  . Highest education level: Not on file  Occupational History  . Not on file  Social Needs  . Financial resource strain: Not on file  . Food insecurity    Worry: Not on file    Inability: Not on file  . Transportation needs    Medical: Not on file    Non-medical: Not on file  Tobacco Use  . Smoking status: Never Smoker  . Smokeless tobacco: Never Used  Substance and Sexual Activity  . Alcohol use: No  . Drug use: No  . Sexual activity: Not Currently    Birth control/protection: Post-menopausal  Lifestyle  . Physical activity    Days per week: Not on file    Minutes per session: Not on file  . Stress: Not on file   Relationships  . Social Herbalist on phone: Not on file    Gets together: Not on file    Attends religious service: Not on file    Active member of club or organization: Not on file    Attends meetings of clubs or organizations: Not on file    Relationship status: Not on file  . Intimate partner violence    Fear of current or ex partner: Not on file    Emotionally abused: Not on file    Physically abused: Not on file    Forced sexual activity: Not on file  Other Topics Concern  . 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 No acute distress.  Alert and oriented. Speech fluent and not dysarthric.  Language intact.    Assessment and Plan:   1.  Migraine without aura, without status migrainosus, not intractable 2.  Seizure disorder, stable  1.  For migraine and seizure preventative management, topiramate 100mg  twice daily.  She reports weight loss which may be due to the topiramate.  She wishes to remain on it for now.  She denies feeling dizzy.  If she continues to lose weight, I told her we would need to switch to another medication. 2.  For abortive therapy, Relpax, ibuprofen or Tylenol 3.  Limit use of pain relievers to no more than 2 days out of week to prevent risk of rebound or medication-overuse headache. 4.  Keep headache diary 5.  Exercise, hydration, caffeine cessation, sleep hygiene, monitor for and avoid triggers 6.  Consider:  magnesium citrate 400mg  daily, riboflavin 400mg  daily, and coenzyme Q10 100mg  three times daily 7. Always keep in mind that currently taking a hormone or birth control may be a possible trigger or aggravating factor for migraine. 8. Follow up in 5 months  Follow Up Instructions:    -I discussed the  assessment and treatment plan with the patient. The patient was provided an opportunity to ask questions and all were answered. The patient agreed with the plan and demonstrated an understanding of the instructions.   The patient was advised to call back or seek an in-person evaluation if the symptoms worsen or if the condition fails to improve as anticipated.  Time spent with patient:  18 minutes    Dudley Major, DO

## 2019-03-10 ENCOUNTER — Telehealth (INDEPENDENT_AMBULATORY_CARE_PROVIDER_SITE_OTHER): Payer: Medicare Other | Admitting: Neurology

## 2019-03-10 ENCOUNTER — Other Ambulatory Visit: Payer: Self-pay

## 2019-03-10 ENCOUNTER — Encounter: Payer: Self-pay | Admitting: Neurology

## 2019-03-10 VITALS — Ht 64.0 in | Wt 128.0 lb

## 2019-03-10 DIAGNOSIS — G43009 Migraine without aura, not intractable, without status migrainosus: Secondary | ICD-10-CM

## 2019-03-10 DIAGNOSIS — G40209 Localization-related (focal) (partial) symptomatic epilepsy and epileptic syndromes with complex partial seizures, not intractable, without status epilepticus: Secondary | ICD-10-CM

## 2019-03-31 ENCOUNTER — Other Ambulatory Visit: Payer: Self-pay | Admitting: Family Medicine

## 2019-03-31 DIAGNOSIS — Z8719 Personal history of other diseases of the digestive system: Secondary | ICD-10-CM

## 2019-03-31 DIAGNOSIS — K219 Gastro-esophageal reflux disease without esophagitis: Secondary | ICD-10-CM

## 2019-03-31 DIAGNOSIS — Z8711 Personal history of peptic ulcer disease: Secondary | ICD-10-CM

## 2019-04-09 ENCOUNTER — Other Ambulatory Visit: Payer: Self-pay

## 2019-04-16 ENCOUNTER — Other Ambulatory Visit: Payer: Self-pay

## 2019-04-16 ENCOUNTER — Ambulatory Visit (INDEPENDENT_AMBULATORY_CARE_PROVIDER_SITE_OTHER): Payer: Medicare Other | Admitting: Family

## 2019-04-16 ENCOUNTER — Encounter: Payer: Self-pay | Admitting: Family

## 2019-04-16 VITALS — BP 155/89 | HR 96 | Temp 98.7°F | Resp 16 | Ht 64.0 in | Wt 129.0 lb

## 2019-04-16 DIAGNOSIS — E538 Deficiency of other specified B group vitamins: Secondary | ICD-10-CM | POA: Diagnosis not present

## 2019-04-16 DIAGNOSIS — R030 Elevated blood-pressure reading, without diagnosis of hypertension: Secondary | ICD-10-CM

## 2019-04-16 DIAGNOSIS — K219 Gastro-esophageal reflux disease without esophagitis: Secondary | ICD-10-CM

## 2019-04-16 MED ORDER — CYANOCOBALAMIN 1000 MCG/ML IJ SOLN
1000.0000 ug | Freq: Once | INTRAMUSCULAR | Status: AC
  Administered 2019-04-16: 1000 ug via INTRAMUSCULAR

## 2019-04-16 MED ORDER — AMLODIPINE BESYLATE 2.5 MG PO TABS
2.5000 mg | ORAL_TABLET | Freq: Every day | ORAL | 3 refills | Status: DC
Start: 2019-04-16 — End: 2019-05-14

## 2019-04-16 NOTE — Patient Instructions (Signed)
Please complete lab work prior to leaving. Begin amlodipine once daily for blood pressure.

## 2019-04-16 NOTE — Progress Notes (Signed)
Subjective:    Patient ID: Lindsay Frost, female    DOB: [birth year], 63 y.o.   MRN: [id number]  HPI  Pt is a 63 yr old female who presents today with chief complaint of elevated blood pressure. Reports that she has been checking it intermittently and she has had some high readings. Reports that her bp has gone up "only when I am standing on my feet." notes that she has had some dizziness with prolonged standing. Feels like this has been happening for 3 weeks.  Reports some days her bp was 150/110. Would lay down and it would "gradually go down to normal."    BP Readings from Last 3 Encounters:  04/16/19 (!) 142/88  10/21/18 110/71  09/17/18 136/88   GERD- continues dexilant. Reports recent increase in her gerd symptoms despite med compliance.  Reports that her symptoms are usually post-prandial. Denies late night meals.    She reports + fatigue, would like to restart b12. Notes feet cramp sometimes.   Review of Systems See HPI  Past Medical History:  Diagnosis Date  . Arthritis   . Back pain    arthritis  . Depression    takes cymbalta daily  . GERD (gastroesophageal reflux disease)    takes Omeprazole daily  . Headache(784.0)    migraines  . Heart murmur   . History of gastric ulcer   . History of migraine    last one about 2wks ago-takes Imitrex prn  . Joint pain   . Joint swelling   . Meningioma ([place])   . Ulcer      Social History   Socioeconomic History  . Marital status: Single    Spouse name: Not on file  . Number of children: Not on file  . Years of education: Not on file  . Highest education level: Not on file  Occupational History  . Not on file  Social Needs  . Financial resource strain: Not on file  . Food insecurity    Worry: Not on file    Inability: Not on file  . Transportation needs    Medical: Not on file    Non-medical: Not on file  Tobacco Use  . Smoking status: Never Smoker  . Smokeless tobacco: Never Used  Substance and Sexual  Activity  . Alcohol use: No  . Drug use: No  . Sexual activity: Not Currently    Birth control/protection: Post-menopausal  Lifestyle  . Physical activity    Days per week: Not on file    Minutes per session: Not on file  . Stress: Not on file  Relationships  . Social Herbalist on phone: Not on file    Gets together: Not on file    Attends religious service: Not on file    Active member of club or organization: Not on file    Attends meetings of clubs or organizations: Not on file    Relationship status: Not on file  . Intimate partner violence    Fear of current or ex partner: Not on file    Emotionally abused: Not on file    Physically abused: Not on file    Forced sexual activity: Not on file  Other Topics Concern  . 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 BALLOON DILATION  04/03/2012   Procedure: BALLOON DILATION;  Surgeon: Beryle Beams, MD;  Location: [facility];  Service: Endoscopy;  Laterality: N/A;  . CHOLECYSTECTOMY N/A 04/24/2018   Procedure: LAPAROSCOPIC CHOLECYSTECTOMY;  Surgeon: Clovis Riley, MD;  Location: [facility];  Service: General;  Laterality: N/A;  . COLONOSCOPY    . DIAGNOSTIC LAPAROSCOPY  78yrs ago  . ESOPHAGEAL MANOMETRY N/A 09/04/2015   Procedure: ESOPHAGEAL MANOMETRY (EM);  Surgeon: Carol Ada, MD;  Location: [facility];  Service: Endoscopy;  Laterality: N/A;  . ESOPHAGOGASTRODUODENOSCOPY  04/03/2012   Procedure: ESOPHAGOGASTRODUODENOSCOPY (EGD);  Surgeon: Beryle Beams, MD;  Location: [facility];  Service: Endoscopy;  Laterality: N/A;  EGD w/ balloon dilation  . ESOPHAGOGASTRODUODENOSCOPY (EGD) WITH PROPOFOL N/A 02/18/2014   Procedure: ESOPHAGOGASTRODUODENOSCOPY (EGD) WITH PROPOFOL;  Surgeon: Beryle Beams, MD;  Location:  [facility];  Service: Endoscopy;  Laterality: N/A;  . ESOPHAGOGASTRODUODENOSCOPY (EGD) WITH PROPOFOL N/A 04/22/2014   Procedure: ESOPHAGOGASTRODUODENOSCOPY (EGD) WITH PROPOFOL;  Surgeon: Beryle Beams, MD;  [facility]: [facility];  Service: Endoscopy;  Laterality: N/A;  . NASAL SEPTUM SURGERY  09/2016  . [place] IMPEDANCE STUDY N/A 09/04/2015   Procedure: [place] IMPEDANCE STUDY;  Surgeon: Carol Ada, MD;  [facility]: [facility];  Service: Endoscopy;  Laterality: N/A;  . REPLACEMENT TOTAL KNEE Right 21yrs ago  . TOTAL KNEE ARTHROPLASTY Left 11/30/2012   Procedure: LEFT TOTAL KNEE ARTHROPLASTY;  Surgeon: Kerin Salen, MD;  Location: [facility];  Service: Orthopedics;  Laterality: Left;    Family History  Problem Relation Age of Onset  . Arthritis Mother   . Cancer Mother        breast  . Heart disease Mother        died from "heart problems" at age 83, had CAD  . Depression Mother   . Heart disease Father        had CABG  . Kidney disease Father   . Arthritis Sister   . Heart murmur Sister   . Arthritis Brother        "serious back/neck problems"  . Arthritis Sister   . Aneurysm Sister     Allergies  Allergen Reactions  . Naratriptan     Ineffective    Current Outpatient Medications on File Prior to Visit  Medication Sig Dispense Refill  . Aspirin-Salicylamide-Caffeine (BC HEADACHE PO) Take 1 packet by mouth daily as needed (headaches).    . cyclobenzaprine (FLEXERIL) 5 MG tablet Take 1 tablet (5 mg total) by mouth at bedtime as needed for muscle spasms. 15 tablet 0  . DEXILANT 60 MG capsule TAKE 1 CAPSULE(60 MG) BY MOUTH DAILY 30 capsule 2  . eletriptan (RELPAX) 40 MG tablet TAKE 1 TABLET BY MOUTH AS NEEDED FOR MIGRAINE OF HEADACHE. MAY REPEAT IN 2 HOURS IF HEADACHE PERSISTS OR RECURS 10 tablet 5  . escitalopram (LEXAPRO) 20 MG tablet TAKE 1 TABLET(20 MG) BY MOUTH DAILY 90 tablet 1  . fluticasone (FLONASE) 50 MCG/ACT nasal spray Place 2 sprays into both nostrils daily. 16 g 6  .  RESTASIS 0.05 % ophthalmic emulsion Place 1 drop into both eyes 2 (two) times daily.     . sucralfate (CARAFATE) 1 g tablet Take 1 g by mouth 4 (four) times daily.    Marland Kitchen tiZANidine (ZANAFLEX) 4 MG tablet daily as needed.    . topiramate (TOPAMAX) 100 MG tablet TAKE 1 TABLET(100 MG) BY MOUTH TWICE DAILY 180 tablet 1  No current facility-administered medications on file prior to visit.     BP (!) 142/88 (BP Location: Right Arm, Patient Position: Sitting, Cuff Size: Small)   Pulse 83   Temp 98.7 F (37.1 C) (Oral)   Resp 16   Ht 5\' 4"  (1.626 m)   Wt 129 lb (58.5 kg)   SpO2 100%   BMI 22.14 kg/m       Objective:   Physical Exam Constitutional:      Appearance: She is well-developed.  Neck:     Musculoskeletal: Neck supple.     Thyroid: No thyromegaly.  Cardiovascular:     Rate and Rhythm: Normal rate and regular rhythm.     Heart sounds: Normal heart sounds. No murmur.  Pulmonary:     Effort: Pulmonary effort is normal. No respiratory distress.     Breath sounds: Normal breath sounds. No wheezing.  Skin:    General: Skin is warm and dry.  Neurological:     Mental Status: She is alert and oriented to person, place, and time.     Comments: Negative hall-pike dix bilaterally  Psychiatric:        Behavior: Behavior normal.        Thought Content: Thought content normal.        Judgment: Judgment normal.           Assessment & Plan:  HTN-  Reviewed bp readings.  Laying 150/71 hr 71 Sitting  144/82 hr 80 Standing 155/89 hr 96  Given home readings will give trial of amlodipine 2.5mg  once daily.  GERD- uncontrolled. Reinforced GERD diet. Continue dexilant.  b12 deficiency- she reports + fatigue and is requesting to restart b12 shots (previously was getting through neurology). B12 shot given today. Check follow up level.

## 2019-05-13 ENCOUNTER — Other Ambulatory Visit: Payer: Self-pay

## 2019-05-14 ENCOUNTER — Ambulatory Visit (INDEPENDENT_AMBULATORY_CARE_PROVIDER_SITE_OTHER): Payer: Medicare Other | Admitting: Family

## 2019-05-14 VITALS — BP 139/88 | HR 79 | Temp 98.1°F | Resp 16 | Ht 64.0 in | Wt 130.4 lb

## 2019-05-14 DIAGNOSIS — E538 Deficiency of other specified B group vitamins: Secondary | ICD-10-CM

## 2019-05-14 DIAGNOSIS — M549 Dorsalgia, unspecified: Secondary | ICD-10-CM

## 2019-05-14 DIAGNOSIS — R809 Proteinuria, unspecified: Secondary | ICD-10-CM | POA: Diagnosis not present

## 2019-05-14 DIAGNOSIS — I10 Essential (primary) hypertension: Secondary | ICD-10-CM

## 2019-05-14 DIAGNOSIS — R0789 Other chest pain: Secondary | ICD-10-CM | POA: Diagnosis not present

## 2019-05-14 DIAGNOSIS — J329 Chronic sinusitis, unspecified: Secondary | ICD-10-CM

## 2019-05-14 DIAGNOSIS — R030 Elevated blood-pressure reading, without diagnosis of hypertension: Secondary | ICD-10-CM | POA: Diagnosis not present

## 2019-05-14 HISTORY — DX: Essential (primary) hypertension: I10

## 2019-05-14 LAB — POC URINALSYSI DIPSTICK (AUTOMATED)
Bilirubin, UA: NEGATIVE
Blood, UA: NEGATIVE
Glucose, UA: NEGATIVE
Ketones, UA: NEGATIVE
Leukocytes, UA: NEGATIVE
Nitrite, UA: NEGATIVE
Protein, UA: POSITIVE — AB
Spec Grav, UA: >=1.03 — AB (ref 1.010–1.025)
Urobilinogen, UA: NEGATIVE E.U./dL — AB
pH, UA: 6 (ref 5.0–8.0)

## 2019-05-14 LAB — URINALYSIS, ROUTINE W REFLEX MICROSCOPIC
Bilirubin Urine: NEGATIVE
Hgb urine dipstick: NEGATIVE
Ketones, ur: NEGATIVE
Leukocytes,Ua: NEGATIVE
Nitrite: NEGATIVE
RBC / HPF: NONE SEEN (ref 0–?)
Specific Gravity, Urine: 1.025 (ref 1.000–1.030)
Total Protein, Urine: NEGATIVE
Urine Glucose: NEGATIVE
Urobilinogen, UA: 0.2 (ref 0.0–1.0)
pH: 5.5 (ref 5.0–8.0)

## 2019-05-14 MED ORDER — AMOXICILLIN 500 MG PO CAPS: 500.0000 mg | ORAL_CAPSULE | Freq: Three times a day (TID) | ORAL | 0 refills | Status: DC

## 2019-05-14 MED ORDER — CYANOCOBALAMIN 1000 MCG/ML IJ SOLN
1000.0000 ug | Freq: Once | INTRAMUSCULAR | Status: AC
  Administered 2019-05-14: 1000 ug via INTRAMUSCULAR

## 2019-05-14 MED ORDER — AMLODIPINE BESYLATE 5 MG PO TABS: 5.0000 mg | ORAL_TABLET | Freq: Every day | ORAL | 3 refills | Status: DC

## 2019-05-14 NOTE — Addendum Note (Signed)
Addended by: Caffie Pinto on: 05/14/2019 12:08 PM   Modules accepted: Orders

## 2019-05-14 NOTE — Progress Notes (Signed)
Patient ID: Lindsay Frost, female    DOB: 1956/05/31, 63 y.o.   MRN: [id number]  HPI  Patient is a 63 yr old female who presents today with several concerns.  HTN- we saw her 1 month ago and amlodipine 2.5mg  was added to her regimen. She reports that her bp this AM was 138/77 this AM. Yesterday 140/88, 8/26 142/101, 148/96, 142/108 153/72, 141/96.  She reports that she had associated light pressure.      BP Readings from Last 3 Encounters:  05/14/19 139/88  04/16/19 (!) 155/89  10/21/18 110/71   B12 deficiency- needs to complete lab work to day to check.    Back pain-  Reports low back pain. Pain is located in the lower back in the middle.    Sinus pressure/congestion- reports some small amount of bloody nasal discharge for the last 3 days. Reports + associated headaches. She started using flonase.  She denies fever.  She reports that she has had some body aches x 1 week.    Past Medical History:  Diagnosis Date  . Arthritis   . Back pain    arthritis  . Depression    takes cymbalta daily  . GERD (gastroesophageal reflux disease)    takes Omeprazole daily  . Headache(784.0)    migraines  . Heart murmur   . History of gastric ulcer   . History of migraine    last one about 2wks ago-takes Imitrex prn  . Joint pain   . Joint swelling   . Meningioma ([place])   . Ulcer      Social History   Socioeconomic History  . Marital status: Single    Spouse name: Not on file  . Number of children: Not on file  . Years of education: Not on file  . Highest education level: Not on file  Occupational History  . Not on file  Social Needs  . Financial resource strain: Not on file  . Food insecurity    Worry: Not on file    Inability: Not on file  . Transportation needs    Medical: Not on file    Non-medical: Not on file  Tobacco Use  . Smoking status: Never Smoker  . Smokeless tobacco: Never Used  Substance and Sexual Activity  . Alcohol use: No  . Drug use: No  . Sexual  activity: Not Currently    Birth control/protection: Post-menopausal  Lifestyle  . Physical activity    Days per week: Not on file    Minutes per session: Not on file  . Stress: Not on file  Relationships  . Social Herbalist on phone: Not on file    Gets together: Not on file    Attends religious service: Not on file    Active member of club or organization: Not on file    Attends meetings of clubs or organizations: Not on file    Relationship status: Not on file  . Intimate partner violence    Fear of current or ex partner: Not on file    Emotionally abused: Not on file    Physically abused: Not on file    Forced sexual activity: Not on file  Other Topics Concern  . 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 BALLOON DILATION  04/03/2012   Procedure: BALLOON DILATION;  Surgeon: Beryle Beams, MD;  [facility]: [facility];  Service: Endoscopy;  Laterality: N/A;  . CHOLECYSTECTOMY N/A 04/24/2018   Procedure: LAPAROSCOPIC CHOLECYSTECTOMY;  Surgeon: Clovis Riley, MD;  Location: [facility];  Service: General;  Laterality: N/A;  . COLONOSCOPY    . DIAGNOSTIC LAPAROSCOPY  52yrs ago  . ESOPHAGEAL MANOMETRY N/A 09/04/2015   Procedure: ESOPHAGEAL MANOMETRY (EM);  Surgeon: Carol Ada, MD;  [facility]: [facility];  Service: Endoscopy;  Laterality: N/A;  . ESOPHAGOGASTRODUODENOSCOPY  04/03/2012   Procedure: ESOPHAGOGASTRODUODENOSCOPY (EGD);  Surgeon: Beryle Beams, MD;  Location: [facility];  Service: Endoscopy;  Laterality: N/A;  EGD w/ balloon dilation  . ESOPHAGOGASTRODUODENOSCOPY (EGD) WITH PROPOFOL N/A 02/18/2014   Procedure: ESOPHAGOGASTRODUODENOSCOPY (EGD) WITH PROPOFOL;  Surgeon: Beryle Beams, MD;  [facility];  Service: Endoscopy;  Laterality: N/A;  .  ESOPHAGOGASTRODUODENOSCOPY (EGD) WITH PROPOFOL N/A 04/22/2014   Procedure: ESOPHAGOGASTRODUODENOSCOPY (EGD) WITH PROPOFOL;  Surgeon: Beryle Beams, MD;  [facility]: [facility];  Service: Endoscopy;  Laterality: N/A;  . NASAL SEPTUM SURGERY  09/2016  . [place] IMPEDANCE STUDY N/A 09/04/2015   Procedure: [place] IMPEDANCE STUDY;  Surgeon: Carol Ada, MD;  [facility];  Service: Endoscopy;  Laterality: N/A;  . REPLACEMENT TOTAL KNEE Right 1yrs ago  . TOTAL KNEE ARTHROPLASTY Left 11/30/2012   Procedure: LEFT TOTAL KNEE ARTHROPLASTY;  Surgeon: Kerin Salen, MD;  Location: [facility];  Service: Orthopedics;  Laterality: Left;    Family History  Problem Relation Age of Onset  . Arthritis Mother   . Cancer Mother        breast  . Heart disease Mother        died from "heart problems" at age 49, had CAD  . Depression Mother   . Heart disease Father        had CABG  . Kidney disease Father   . Arthritis Sister   . Heart murmur Sister   . Arthritis Brother        "serious back/neck problems"  . Arthritis Sister   . Aneurysm Sister     Allergies  Allergen Reactions  . Naratriptan     Ineffective    Current Outpatient Medications on File Prior to Visit  Medication Sig Dispense Refill  . Aspirin-Salicylamide-Caffeine (BC HEADACHE PO) Take 1 packet by mouth daily as needed (headaches).    . cyclobenzaprine (FLEXERIL) 5 MG tablet Take 1 tablet (5 mg total) by mouth at bedtime as needed for muscle spasms. 15 tablet 0  . DEXILANT 60 MG capsule TAKE 1 CAPSULE(60 MG) BY MOUTH DAILY 30 capsule 2  . eletriptan (RELPAX) 40 MG tablet TAKE 1 TABLET BY MOUTH AS NEEDED FOR MIGRAINE OF HEADACHE. MAY REPEAT IN 2 HOURS IF HEADACHE PERSISTS OR RECURS 10 tablet 5  . escitalopram (LEXAPRO) 20 MG tablet TAKE 1 TABLET(20 MG) BY MOUTH DAILY 90 tablet 1  . fluticasone (FLONASE) 50 MCG/ACT nasal spray Place 2 sprays into both nostrils daily. 16 g 6  . RESTASIS 0.05 % ophthalmic emulsion Place 1 drop into both eyes  2 (two) times daily.     . sucralfate (CARAFATE) 1 g tablet Take 1 g by mouth 4 (four) times daily.    Marland Kitchen tiZANidine (ZANAFLEX) 4 MG tablet daily as needed.    . topiramate (TOPAMAX) 100 MG tablet TAKE 1 TABLET(100 MG) BY MOUTH TWICE DAILY 180 tablet 1  No current facility-administered medications on file prior to visit.     BP 139/88 (BP Location: Right Arm, Patient Position: Sitting, Cuff Size: Small)   Pulse 79   Temp 98.1 F (36.7 C) (Temporal)   Resp 16   Ht 5\' 4"  (1.626 m)   Wt 130 lb 6.4 oz (59.1 kg)   SpO2 100%   BMI 22.38 kg/m   Constitutional:      Appearance: She is well-developed.  Cardiovascular:     Rate and Rhythm: Normal rate and regular rhythm.     Heart sounds: Normal heart sounds. No murmur.  Pulmonary:     Effort: Pulmonary effort is normal. No respiratory distress.     Breath sounds: Normal breath sounds. No wheezing.  Abdominal:     General: There is no distension.     Palpations: Abdomen is soft.  Skin:    General: Skin is warm and dry.  Neurological:     Mental Status: She is alert and oriented to person, place, and time.  Psychiatric:        Behavior: Behavior normal.        Thought Content: Thought content normal.        Judgment: Judgment normal.   HTN- bp improved. Continue current dose of amlodipine.  Atypical chest pain-EKG is performed today and personally reviewed.  I also reviewed the EKG with cardiology Dr. the day Dr. Irish Lack.  He reviewed the EKG and felt that it did indicate normal sinus rhythm without any acute ischemic changes.  Patient was advised to go to the emergency room should she develop recurrent chest pressure patient verbalizes understanding.  B12 deficiency- she is due for follow-up B12 level and a B12 injection today which is been given.  Sinusitis-will Rx with amoxicillin.  She is advised to call if symptoms worsen or fail to improve.  Back pain- appears to be musculoskeletal in nature.  Urinalysis did not suggest  UTI.  Incidental note of proteinuria was made will send urinalysis to lab for formal urinalysis is I have had many positive proteins on point-of-care urine.

## 2019-05-14 NOTE — Patient Instructions (Addendum)
Please go to the ER if you develop recurrent chest pressure. Complete lab work prior to leaving.  Increase amlodipine from 2.5 to 5 mg.

## 2019-05-15 LAB — CBC WITH DIFFERENTIAL/PLATELET
Absolute Monocytes: 432 cells/uL (ref 200–950)
Basophils Absolute: 99 cells/uL (ref 0–200)
Basophils Relative: 2.1 %
Eosinophils Absolute: 150 cells/uL (ref 15–500)
Eosinophils Relative: 3.2 %
HCT: 38.6 % (ref 35.0–45.0)
Hemoglobin: 12.4 g/dL (ref 11.7–15.5)
Lymphs Abs: 1875 cells/uL (ref 850–3900)
MCH: 29.8 pg (ref 27.0–33.0)
MCHC: 32.1 g/dL (ref 32.0–36.0)
MCV: 92.8 fL (ref 80.0–100.0)
MPV: 11.1 fL (ref 7.5–12.5)
Monocytes Relative: 9.2 %
Neutro Abs: 2143 cells/uL (ref 1500–7800)
Neutrophils Relative %: 45.6 %
Platelets: 331 10*3/uL (ref 140–400)
RBC: 4.16 10*6/uL (ref 3.80–5.10)
RDW: 12.6 % (ref 11.0–15.0)
Total Lymphocyte: 39.9 %
WBC: 4.7 10*3/uL (ref 3.8–10.8)

## 2019-05-15 LAB — BASIC METABOLIC PANEL
BUN: 21 mg/dL (ref 7–25)
CO2: 20 mmol/L (ref 20–32)
Calcium: 9.3 mg/dL (ref 8.6–10.4)
Chloride: 110 mmol/L (ref 98–110)
Creat: 0.78 mg/dL (ref 0.50–0.99)
Glucose, Bld: 75 mg/dL (ref 65–99)
Potassium: 3.7 mmol/L (ref 3.5–5.3)
Sodium: 141 mmol/L (ref 135–146)

## 2019-05-15 LAB — VITAMIN B12: Vitamin B-12: >2000 pg/mL — ABNORMAL HIGH (ref 200–1100)

## 2019-06-03 ENCOUNTER — Other Ambulatory Visit: Payer: Self-pay | Admitting: Neurology

## 2019-06-15 ENCOUNTER — Other Ambulatory Visit: Payer: Self-pay

## 2019-06-15 ENCOUNTER — Ambulatory Visit (INDEPENDENT_AMBULATORY_CARE_PROVIDER_SITE_OTHER): Payer: Medicare Other | Admitting: Family

## 2019-06-15 ENCOUNTER — Encounter: Payer: Self-pay | Admitting: Family

## 2019-06-15 VITALS — BP 135/80 | HR 78

## 2019-06-15 DIAGNOSIS — K219 Gastro-esophageal reflux disease without esophagitis: Secondary | ICD-10-CM | POA: Diagnosis not present

## 2019-06-15 DIAGNOSIS — J329 Chronic sinusitis, unspecified: Secondary | ICD-10-CM | POA: Diagnosis not present

## 2019-06-15 DIAGNOSIS — M549 Dorsalgia, unspecified: Secondary | ICD-10-CM | POA: Diagnosis not present

## 2019-06-15 DIAGNOSIS — I10 Essential (primary) hypertension: Secondary | ICD-10-CM | POA: Diagnosis not present

## 2019-06-15 NOTE — Progress Notes (Signed)
Virtual Visit via Telephone Note  I connected with Lindsay Frost on 06/15/19 at  1:00 PM EDT by telephone and verified that I am speaking with the correct person using two identifiers.  Location: Patient: home Provider: work   I discussed the limitations, risks, security and privacy concerns of performing an evaluation and management service by telephone and the availability of in person appointments. I also discussed with the patient that there may be a patient responsible charge related to this service. The patient expressed understanding and agreed to proceed.   History of Present Illness:  Patient is a 63 yr old female who presents today for follow up of her hypertension. Last visit we increased amlodipine from 2.5 to 5mg . She denies any side effects on this dose. Denies LE edema.    BP Readings from Last 3 Encounters:  06/15/19 135/80  05/14/19 139/88  04/16/19 (!) 155/89   Sinusitis- reports resolution of sinus symptoms following antibiotic course last month.    GERD- reports that she continues to have gerd symptoms despite use of dexilant. Would like to be referred to GI.   Back pain- improved.   Observations/Objective:  Gen: Awake, alert Resp: Breathing sounds even and non-labored Psych: calm/pleasant demeanor Neuro: Alert and Oriented x 3,  speech is clear.   Assessment and Plan:  GERD- uncontrolled. Will refer to GI.  Sinusitis- resolved.  Back pain- resolved.  HTN- improved on increased dose of amlodipine. Continue same.   Follow Up Instructions:    I discussed the assessment and treatment plan with the patient. The patient was provided an opportunity to ask questions and all were answered. The patient agreed with the plan and demonstrated an understanding of the instructions.   The patient was advised to call back or seek an in-person evaluation if the symptoms worsen or if the condition fails to improve as anticipated.  I provided 11 minutes of  non-face-to-face time during this encounter.   [name], NP

## 2019-06-18 ENCOUNTER — Other Ambulatory Visit: Payer: Self-pay

## 2019-06-18 ENCOUNTER — Encounter: Payer: Self-pay | Admitting: Gastroenterology

## 2019-06-18 ENCOUNTER — Ambulatory Visit (INDEPENDENT_AMBULATORY_CARE_PROVIDER_SITE_OTHER): Payer: Medicare Other | Admitting: Gastroenterology

## 2019-06-18 VITALS — BP 134/86 | HR 72 | Temp 98.0°F | Ht 64.0 in | Wt 134.1 lb

## 2019-06-18 DIAGNOSIS — R195 Other fecal abnormalities: Secondary | ICD-10-CM | POA: Diagnosis not present

## 2019-06-18 DIAGNOSIS — K315 Obstruction of duodenum: Secondary | ICD-10-CM

## 2019-06-18 DIAGNOSIS — R1030 Lower abdominal pain, unspecified: Secondary | ICD-10-CM | POA: Diagnosis not present

## 2019-06-18 DIAGNOSIS — K219 Gastro-esophageal reflux disease without esophagitis: Secondary | ICD-10-CM | POA: Diagnosis not present

## 2019-06-18 DIAGNOSIS — K921 Melena: Secondary | ICD-10-CM

## 2019-06-18 DIAGNOSIS — R634 Abnormal weight loss: Secondary | ICD-10-CM

## 2019-06-18 DIAGNOSIS — K449 Diaphragmatic hernia without obstruction or gangrene: Secondary | ICD-10-CM

## 2019-06-18 DIAGNOSIS — K279 Peptic ulcer, site unspecified, unspecified as acute or chronic, without hemorrhage or perforation: Secondary | ICD-10-CM

## 2019-06-18 NOTE — Progress Notes (Signed)
Chief Complaint: GERD, lower abdominal pain, change in bowel habits  Referring Provider:     Debbrah Alar, NP  HPI:     Lindsay Frost is a 63 y.o. female referred to the [facility] for evaluation of multiple GI symptoms, to include reflux, lower abdominal pain, and change in bowel habits.  She describes a longstanding history of reflux, characterized by HB, regurgitation, sourbrash. Intermittent dry cough. +dysphagia for the last few years, pointing to lower sternal border. Reflux sxs have been worsening in the last year. Increased postprandial and nocturnal HB. Worse with fruit, spicy food. Takes Maalox for breakthrough sxs, with increasing frequency. Has ongoing symptoms despite Dexilant. Has previously trialed Carafate, Tums, Prilosec.   Previously followed by Dr. 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  Additionally, she endorses change sin bowel habits, alternating between diarrhea and constipation for hte last 3-4 months. No preceding change in medications, diet, or activitry. Intermittent BRB and marroon colored blood mixed in stool over last few months. Intermittent lower abdominal cramping pain over this time. Not reliably improved with BM. Trialed stool softeners without change.    Has lost 85# over the last few years.  Unintentional. No night sweats, fever. CT abd/pelvis in 08/2018 without intraabodminal process.  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  Reports some abnormality on prior spine imaging, and has been following with a spine surgeon.   Normal CBC and BMP in 04/2019.  Normal CMP in 06/2018. ccy in 04/2018.   Endoscopic history: -EGD (07/2018, Dr. Benson Norway): Normal esophagus, 4 gastric ulcers, 1 duodenal ulcer, antral/duodenal bulb deformity with pyloric stenosis but easily traversable -EGD (12/2015, Dr. Benson Norway): LA Grade D esophagitis, 3 cm HH, 2 gastric ulcers, gastritis,  mild duodenal stenosis.  Started on Dexilant - EGD (04/2014, Dr. Benson Norway): 3 cm HH, healing antral ulcer, duodenal bulb deformity with nonobstructive stricture -EGD (02/2014, Dr. Benson Norway): 3 cm HH, multiple clean-based ulcers in the antrum, posterior duodenal bulb stenosis but traversable -EGD (03/2012, Dr. Benson Norway): Antral ulcers, post bulbar stricture, dilated with 15 mm TTS balloon with mucosal rent -EGD (03/2012, Dr. Benson Norway): Antral ulcers, post bulbar stricture which was not traversed, unsuccessful balloon dilation -Colonoscopy (07/2010, Dr. Benson Norway): Normal.  Repeat 10 years   Past Medical History:  Diagnosis Date   Arthritis    Back pain    arthritis   Depression    takes cymbalta daily   GERD (gastroesophageal reflux disease)    takes Omeprazole daily   Headache(784.0)    migraines   Heart murmur    History of gastric ulcer    History of migraine    last one about 2wks ago-takes Imitrex prn   Joint pain    Joint swelling    Meningioma ([place])    Ulcer      Past Surgical History:  Procedure Laterality Date   BALLOON DILATION  04/03/2012   Procedure: BALLOON DILATION;  Surgeon: Beryle Beams, MD;  Location: [facility];  Service: Endoscopy;  Laterality: N/A;   CHOLECYSTECTOMY N/A 04/24/2018   Procedure: LAPAROSCOPIC CHOLECYSTECTOMY;  Surgeon: Clovis Riley, MD;  Location: [facility];  Service: General;  Laterality: N/A;   COLONOSCOPY     DIAGNOSTIC LAPAROSCOPY  27yrs ago   ESOPHAGEAL MANOMETRY N/A 09/04/2015   Procedure: ESOPHAGEAL MANOMETRY (EM);  Surgeon: Carol Ada, MD;  [facility]: [facility];  Service: Endoscopy;  Laterality: N/A;  ESOPHAGOGASTRODUODENOSCOPY  04/03/2012   Procedure: ESOPHAGOGASTRODUODENOSCOPY (EGD);  Surgeon: Beryle Beams, MD;  [facility];  Service: Endoscopy;  Laterality: N/A;  EGD w/ balloon dilation   ESOPHAGOGASTRODUODENOSCOPY (EGD) WITH PROPOFOL N/A 02/18/2014   Procedure: ESOPHAGOGASTRODUODENOSCOPY (EGD) WITH PROPOFOL;  Surgeon:  Beryle Beams, MD;  [facility];  Service: Endoscopy;  Laterality: N/A;   ESOPHAGOGASTRODUODENOSCOPY (EGD) WITH PROPOFOL N/A 04/22/2014   Procedure: ESOPHAGOGASTRODUODENOSCOPY (EGD) WITH PROPOFOL;  Surgeon: Beryle Beams, MD;  [facility];  Service: Endoscopy;  Laterality: N/A;   NASAL SEPTUM SURGERY  09/2016   [place] IMPEDANCE STUDY N/A 09/04/2015   Procedure: [place] IMPEDANCE STUDY;  Surgeon: Carol Ada, MD;  [facility];  Service: Endoscopy;  Laterality: N/A;   REPLACEMENT TOTAL KNEE Right 76yrs ago   TOTAL KNEE ARTHROPLASTY Left 11/30/2012   Procedure: LEFT TOTAL KNEE ARTHROPLASTY;  Surgeon: Kerin Salen, MD;  Location: [facility];  Service: Orthopedics;  Laterality: Left;   Family History  Problem Relation Age of Onset   Arthritis Mother    Cancer Mother        breast   Heart disease Mother        died from "heart problems" at age 50, had CAD   Depression Mother    Heart disease Father        had CABG   Kidney disease Father    Arthritis Sister    Heart murmur Sister    Arthritis Brother        "serious back/neck problems"   Arthritis Sister    Aneurysm Sister    Colon cancer Neg Hx    Esophageal cancer Neg Hx    Social History   Tobacco Use   Smoking status: Never Smoker   Smokeless tobacco: Never Used  Substance Use Topics   Alcohol use: No   Drug use: No   Current Outpatient Medications  Medication Sig Dispense Refill   amLODipine (NORVASC) 5 MG tablet Take 1 tablet (5 mg total) by mouth daily. 30 tablet 3   Aspirin-Salicylamide-Caffeine (BC HEADACHE PO) Take 1 packet by mouth daily as needed (headaches).     cyclobenzaprine (FLEXERIL) 5 MG tablet Take 1 tablet (5 mg total) by mouth at bedtime as needed for muscle spasms. 15 tablet 0   DEXILANT 60 MG capsule TAKE 1 CAPSULE(60 MG) BY MOUTH DAILY 30 capsule 2   eletriptan (RELPAX) 40 MG tablet TAKE 1 TABLET BY MOUTH AS NEEDED FOR MIGRAINE OF HEADACHE. MAY REPEAT IN 2  HOURS IF HEADACHE PERSISTS OR RECURS 10 tablet 5   fluticasone (FLONASE) 50 MCG/ACT nasal spray Place 2 sprays into both nostrils daily. (Patient taking differently: Place 2 sprays into both nostrils as needed. ) 16 g 6   Lifitegrast (XIIDRA) 5 % SOLN Apply 1 drop to eye 2 (two) times daily.     tiZANidine (ZANAFLEX) 4 MG tablet daily as needed.     topiramate (TOPAMAX) 100 MG tablet TAKE 1 TABLET(100 MG) BY MOUTH TWICE DAILY 180 tablet 1   No current facility-administered medications for this visit.    Allergies  Allergen Reactions   Naratriptan     Ineffective     Review of Systems: All systems reviewed and negative except where noted in HPI.     Physical Exam:    Wt Readings from Last 3 Encounters:  06/18/19 134 lb 2 oz (60.8 kg)  05/14/19 130 lb 6.4 oz (59.1 kg)  04/16/19 129 lb (58.5 kg)    BP  134/86    Pulse 72    Temp 98 F (36.7 C)    Ht 5\' 4"  (1.626 m)    Wt 134 lb 2 oz (60.8 kg)    BMI 23.02 kg/m  Constitutional:  Pleasant, in no acute distress. Psychiatric: Normal mood and affect. Behavior is normal. EENT: Pupils normal.  Conjunctivae are normal. No scleral icterus. Neck supple. No cervical LAD. Cardiovascular: Normal rate, regular rhythm. No edema Pulmonary/chest: Effort normal and breath sounds normal. No wheezing, rales or rhonchi. Abdominal: Soft, nondistended, nontender. Bowel sounds active throughout. There are no masses palpable. No hepatomegaly. Neurological: Alert and oriented to person place and time. Skin: Skin is warm and dry. No rashes noted.   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  Discussed evaluation/treatment strategies, to include changing acid suppression therapy, increasing dose, evaluation for erosive esophagitis, etc. with EGD, or pH/impedance vs Bravo.   She opted for the latter, which I feel is very reasonable  - EGD with Bravo at [facility] to evaluate for continued reflux despite Dexilant.  Do not take OTC antacids for the duration of the study -Evaluate for erosive esophagitis, LES laxity and size of hernia at time of EGD -Duodenal and gastric biopsies - Additional treatment pending study results -Assess for patency of previously described post bulbar stenosis at time of EGD  5) Change in bowel habits 6) Hematochezia 7) Lower abdominal pain 8) Weight loss  - Fiber supplement - Ensure adequate hydration - Colonoscopy to assess for mucosal/luminal etiology -Objectively, her weight is largely the same since 07/2018 (#135 then, 134 today).  However, baseline was low 200s, as recently as 06/2016, then per record review, had lost 70-80 between then and 07/2018. -CT abdomen/pelvis in 08/2017 otherwise unrevealing.  Normal CBC and BMP. - Assess for malabsorption with duodenal biopsies at time of EGD -Random and directed colonic biopsies - Given need for EGD with Bravo to be completed at [facility], will plan for colonoscopy at [facility] as well  RTC in 3 months or sooner prn  The indications, risks, and benefits of EGD and colonoscopy were explained to the patient in detail. Risks include but are not limited to bleeding, perforation, adverse reaction to medications, and cardiopulmonary compromise. Sequelae include but are not limited to the possibility of surgery, hositalization, and mortality. The patient verbalized understanding and wished to proceed. All questions answered, referred to scheduler and bowel prep ordered. Further recommendations pending results of the exam.    Lavena Bullion, DO, FACG  06/18/2019, 11:08 AM   Debbrah Alar, NP

## 2019-06-18 NOTE — Patient Instructions (Signed)
If you are age 63 or older, your body mass index should be between 23-30. Your Body mass index is 23.02 kg/m. If this is out of the aforementioned range listed, please consider follow up with your Primary Care Provider.  If you are age 101 or younger, your body mass index should be between 19-25. Your Body mass index is 23.02 kg/m. If this is out of the aformentioned range listed, please consider follow up with your Primary Care Provider.   It has been recommended to you by your physician that you have a(n) EGD and Colonoscopy completed. We did not schedule the procedure(s) today, our office will contact you with a date and time for this appointment.  Follow up in 3 months.  It was a pleasure to see you today!  Vito Cirigliano, D.O.

## 2019-07-02 ENCOUNTER — Telehealth: Payer: Self-pay | Admitting: Gastroenterology

## 2019-07-02 NOTE — Telephone Encounter (Signed)
Called and spoke with patient- patient is requesting to know when she is going to be scheduled for her EGD w/bravo and colon at [facility]; patient advised that the appt date/times for October and November are already filled and that she would be contacted in the near future to schedule her procedure at [facility] in December-patient is appreciative for this information and will be waiting to have a phone call to set her procedure up; Patient advised to call back to the office at [phone] should questions/concerns arise; Patient verbalized understanding of information/instructions;

## 2019-07-13 ENCOUNTER — Other Ambulatory Visit: Payer: Self-pay | Admitting: *Deleted

## 2019-07-13 MED ORDER — AMLODIPINE BESYLATE 5 MG PO TABS: 5.0000 mg | ORAL_TABLET | Freq: Every day | ORAL | 1 refills | Status: DC

## 2019-07-22 ENCOUNTER — Other Ambulatory Visit: Payer: Self-pay | Admitting: Gastroenterology

## 2019-07-22 ENCOUNTER — Telehealth: Payer: Self-pay

## 2019-07-22 DIAGNOSIS — R634 Abnormal weight loss: Secondary | ICD-10-CM

## 2019-07-22 DIAGNOSIS — R195 Other fecal abnormalities: Secondary | ICD-10-CM

## 2019-07-22 DIAGNOSIS — K315 Obstruction of duodenum: Secondary | ICD-10-CM

## 2019-07-22 DIAGNOSIS — K279 Peptic ulcer, site unspecified, unspecified as acute or chronic, without hemorrhage or perforation: Secondary | ICD-10-CM

## 2019-07-22 DIAGNOSIS — R1030 Lower abdominal pain, unspecified: Secondary | ICD-10-CM

## 2019-07-22 DIAGNOSIS — K921 Melena: Secondary | ICD-10-CM

## 2019-07-22 DIAGNOSIS — K449 Diaphragmatic hernia without obstruction or gangrene: Secondary | ICD-10-CM

## 2019-07-22 DIAGNOSIS — K219 Gastro-esophageal reflux disease without esophagitis: Secondary | ICD-10-CM

## 2019-07-22 MED ORDER — CLENPIQ 10-3.5-12 MG-GM -GM/160ML PO SOLN: 1.0000 | Freq: Once | ORAL | 0 refills | Status: AC

## 2019-08-09 ENCOUNTER — Ambulatory Visit (INDEPENDENT_AMBULATORY_CARE_PROVIDER_SITE_OTHER): Payer: Medicare Other | Admitting: Family

## 2019-08-09 ENCOUNTER — Other Ambulatory Visit: Payer: Self-pay | Admitting: Family

## 2019-08-09 DIAGNOSIS — J019 Acute sinusitis, unspecified: Secondary | ICD-10-CM | POA: Diagnosis not present

## 2019-08-09 DIAGNOSIS — J301 Allergic rhinitis due to pollen: Secondary | ICD-10-CM

## 2019-08-09 MED ORDER — AMOXICILLIN-POT CLAVULANATE 875-125 MG PO TABS: 1.0000 | ORAL_TABLET | Freq: Two times a day (BID) | ORAL | 0 refills | Status: DC

## 2019-08-09 NOTE — Progress Notes (Signed)
Virtual Visit via Telephone Note  I connected with Lindsay Frost on 08/09/19 at 10:20 AM EST by telephone and verified that I am speaking with the correct person using two identifiers.  Location: Patient: home Provider: work   I discussed the limitations, risks, security and privacy concerns of performing an evaluation and management service by telephone and the availability of in person appointments. I also discussed with the patient that there may be a patient responsible charge related to this service. The patient expressed understanding and agreed to proceed.   History of Present Illness:  Patient reports 10 day hx of sinus HA.  Worse in the AM. Has nasal discharge, bloody x the last 3 days. She is coughing up dark colored mucous.  Notes nose is "really sore."  + tenderness to palpation beneath both eyes.  Feels like her ears are filled with fluid because she has felt a little bit dizzy with walking. She has been using flonase without much improvement in her symptoms.  Has had to sleep propped up the last few nights. + cough which she attributes to the drainage.  She denies covid exposure and has been staying home.   Past Medical History:  Diagnosis Date  . Arthritis   . Back pain    arthritis  . Depression    takes cymbalta daily  . Gallstones   . GERD (gastroesophageal reflux disease)    takes Omeprazole daily  . Headache(784.0)    migraines  . Heart murmur   . History of gastric ulcer   . History of migraine    last one about 2wks ago-takes Imitrex prn  . Joint pain   . Joint swelling   . Meningioma ([place])   . Ulcer      Social History   Socioeconomic History  . Marital status: Widowed    Spouse name: Not on file  . Number of children: 1  . Years of education: Not on file  . Highest education level: Not on file  Occupational History  . Occupation: Retired  Scientific laboratory technician  . Financial resource strain: Not on file  . Food insecurity    Worry: Not on file     Inability: Not on file  . Transportation needs    Medical: Not on file    Non-medical: Not on file  Tobacco Use  . Smoking status: Never Smoker  . Smokeless tobacco: Never Used  Substance and Sexual Activity  . Alcohol use: No  . Drug use: No  . Sexual activity: Not Currently    Birth control/protection: Post-menopausal  Lifestyle  . Physical activity    Days per week: Not on file    Minutes per session: Not on file  . Stress: Not on file  Relationships  . Social Herbalist on phone: Not on file    Gets together: Not on file    Attends religious service: Not on file    Active member of club or organization: Not on file    Attends meetings of clubs or organizations: Not on file    Relationship status: Not on file  . Intimate partner violence    Fear of current or ex partner: Not on file    Emotionally abused: Not on file    Physically abused: Not on file    Forced sexual activity: Not on file  Other Topics Concern  . 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 BALLOON DILATION  04/03/2012   Procedure: BALLOON DILATION;  Surgeon: Beryle Beams, MD;  [facility];  Service: Endoscopy;  Laterality: N/A;  . CHOLECYSTECTOMY N/A 04/24/2018   Procedure: LAPAROSCOPIC CHOLECYSTECTOMY;  Surgeon: Clovis Riley, MD;  [facility];  Service: General;  Laterality: N/A;  . COLONOSCOPY    . DIAGNOSTIC LAPAROSCOPY  79yrs ago  . ESOPHAGEAL MANOMETRY N/A 09/04/2015   Procedure: ESOPHAGEAL MANOMETRY (EM);  Surgeon: Carol Ada, MD;  [facility];  Service: Endoscopy;  Laterality: N/A;  . ESOPHAGOGASTRODUODENOSCOPY  04/03/2012   Procedure: ESOPHAGOGASTRODUODENOSCOPY (EGD);  Surgeon: Beryle Beams, MD;  Location: [facility];  Service: Endoscopy;   Laterality: N/A;  EGD w/ balloon dilation  . ESOPHAGOGASTRODUODENOSCOPY (EGD) WITH PROPOFOL N/A 02/18/2014   Procedure: ESOPHAGOGASTRODUODENOSCOPY (EGD) WITH PROPOFOL;  Surgeon: Beryle Beams, MD;  [facility];  Service: Endoscopy;  Laterality: N/A;  . ESOPHAGOGASTRODUODENOSCOPY (EGD) WITH PROPOFOL N/A 04/22/2014   Procedure: ESOPHAGOGASTRODUODENOSCOPY (EGD) WITH PROPOFOL;  Surgeon: Beryle Beams, MD;  [facility];  Service: Endoscopy;  Laterality: N/A;  . NASAL SEPTUM SURGERY  09/2016  . [place] IMPEDANCE STUDY N/A 09/04/2015   Procedure: [place] IMPEDANCE STUDY;  Surgeon: Carol Ada, MD;  [facility];  Service: Endoscopy;  Laterality: N/A;  . REPLACEMENT TOTAL KNEE Right 68yrs ago  . TOTAL KNEE ARTHROPLASTY Left 11/30/2012   Procedure: LEFT TOTAL KNEE ARTHROPLASTY;  Surgeon: Kerin Salen, MD;  Location: [facility];  Service: Orthopedics;  Laterality: Left;    Family History  Problem Relation Age of Onset  . Arthritis Mother   . Cancer Mother        breast  . Heart disease Mother        died from "heart problems" at age 13, had CAD  . Depression Mother   . Heart disease Father        had CABG  . Kidney disease Father   . Arthritis Sister   . Heart murmur Sister   . Arthritis Brother        "serious back/neck problems"  . Arthritis Sister   . Aneurysm Sister   . Colon cancer Neg Hx   . Esophageal cancer Neg Hx     Allergies  Allergen Reactions  . Naratriptan     Ineffective    Current Outpatient Medications on File Prior to Visit  Medication Sig Dispense Refill  . amLODipine (NORVASC) 5 MG tablet Take 1 tablet (5 mg total) by mouth daily. 90 tablet 1  . Aspirin-Salicylamide-Caffeine (BC HEADACHE PO) Take 1 packet by mouth daily as needed (headaches).    . cyclobenzaprine (FLEXERIL) 5 MG tablet Take 1 tablet (5 mg total) by mouth at bedtime as needed for muscle spasms. 15 tablet 0  . DEXILANT 60 MG capsule TAKE 1 CAPSULE(60 MG) BY MOUTH DAILY 30 capsule 2   . eletriptan (RELPAX) 40 MG tablet TAKE 1 TABLET BY MOUTH AS NEEDED FOR MIGRAINE OF HEADACHE. MAY REPEAT IN 2 HOURS IF HEADACHE PERSISTS OR RECURS 10 tablet 5  . fluticasone (FLONASE) 50 MCG/ACT nasal spray Place 2 sprays into both nostrils daily. (Patient taking differently: Place 2 sprays into both nostrils as needed. ) 16 g 6  . Lifitegrast (XIIDRA) 5 % SOLN Apply 1 drop to eye 2 (two) times  daily.    . tiZANidine (ZANAFLEX) 4 MG tablet daily as needed.    . topiramate (TOPAMAX) 100 MG tablet TAKE 1 TABLET(100 MG) BY MOUTH TWICE DAILY 180 tablet 1   No current facility-administered medications on file prior to visit.     There were no vitals taken for this visit.      Observations/Objective:   Gen: Awake, alert, no acute distress Resp: Breathing sounds even and non-labored Psych: calm/pleasant demeanor Neuro: Alert and Oriented x 3,  speech is clear.    Assessment and Plan:  Sinusitis- new. Will rx with augmentin. Advised pt to use tylenol as needed for pain. Call if symptoms worsen or if not improvedin 2-3 days. Pt verbalizes understanding.  Follow Up Instructions:    I discussed the assessment and treatment plan with the patient. The patient was provided an opportunity to ask questions and all were answered. The patient agreed with the plan and demonstrated an understanding of the instructions.   The patient was advised to call back or seek an in-person evaluation if the symptoms worsen or if the condition fails to improve as anticipated.  I provided 7 minutes of non-face-to-face time during this encounter.   [name], NP
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
## 2019-08-16 ENCOUNTER — Encounter: Payer: Self-pay | Admitting: Neurology

## 2019-08-16 ENCOUNTER — Telehealth (INDEPENDENT_AMBULATORY_CARE_PROVIDER_SITE_OTHER): Payer: Medicare Other | Admitting: Neurology

## 2019-08-16 ENCOUNTER — Other Ambulatory Visit: Payer: Self-pay

## 2019-08-16 VITALS — Ht 64.0 in | Wt 135.0 lb

## 2019-08-16 DIAGNOSIS — G43009 Migraine without aura, not intractable, without status migrainosus: Secondary | ICD-10-CM | POA: Diagnosis not present

## 2019-08-16 DIAGNOSIS — G40209 Localization-related (focal) (partial) symptomatic epilepsy and epileptic syndromes with complex partial seizures, not intractable, without status epilepticus: Secondary | ICD-10-CM

## 2019-08-16 DIAGNOSIS — R634 Abnormal weight loss: Secondary | ICD-10-CM

## 2019-08-20 ENCOUNTER — Other Ambulatory Visit (HOSPITAL_COMMUNITY)
Admission: RE | Admit: 2019-08-20 | Discharge: 2019-08-20 | Disposition: A | Discharge status: Home / Self Care | Payer: Medicare Other | Source: Ambulatory Visit | Attending: Gastroenterology | Admitting: Gastroenterology

## 2019-08-20 ENCOUNTER — Encounter (HOSPITAL_COMMUNITY): Payer: Self-pay

## 2019-08-20 DIAGNOSIS — Z20828 Contact with and (suspected) exposure to other viral communicable diseases: Secondary | ICD-10-CM | POA: Diagnosis not present

## 2019-08-20 DIAGNOSIS — Z01812 Encounter for preprocedural laboratory examination: Secondary | ICD-10-CM | POA: Insufficient documentation

## 2019-08-20 NOTE — Progress Notes (Signed)
Attempted to obtain medical history via telephone, unable to reach at this time. I left a voicemail to return pre surgical testing department's phone call.  

## 2019-08-21 LAB — NOVEL CORONAVIRUS, NAA (HOSP ORDER, SEND-OUT TO REF LAB; TAT 18-24 HRS): SARS-CoV-2, NAA: NOT DETECTED

## 2019-08-24 ENCOUNTER — Ambulatory Visit (HOSPITAL_COMMUNITY): Payer: Medicare Other | Admitting: Certified Registered"

## 2019-08-24 ENCOUNTER — Ambulatory Visit (HOSPITAL_COMMUNITY)
Admission: RE | Admit: 2019-08-24 | Discharge: 2019-08-24 | Disposition: A | Discharge status: Home / Self Care | Payer: Medicare Other | Source: Ambulatory Visit | Attending: Gastroenterology | Admitting: Gastroenterology

## 2019-08-24 ENCOUNTER — Encounter (HOSPITAL_COMMUNITY): Payer: Self-pay | Admitting: *Deleted

## 2019-08-24 ENCOUNTER — Encounter (HOSPITAL_COMMUNITY)
Admission: RE | Disposition: A | Discharge status: Home / Self Care | Payer: Self-pay | Source: Ambulatory Visit | Attending: Gastroenterology

## 2019-08-24 ENCOUNTER — Other Ambulatory Visit: Payer: Self-pay

## 2019-08-24 DIAGNOSIS — K571 Diverticulosis of small intestine 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 or abscess without bleeding: Secondary | ICD-10-CM | POA: Insufficient documentation

## 2019-08-24 DIAGNOSIS — K2100 Gastro-esophageal reflux disease with esophagitis, without bleeding: Secondary | ICD-10-CM | POA: Diagnosis not present

## 2019-08-24 DIAGNOSIS — I10 Essential (primary) hypertension: Secondary | ICD-10-CM | POA: Insufficient documentation

## 2019-08-24 DIAGNOSIS — R634 Abnormal weight loss: Secondary | ICD-10-CM | POA: Diagnosis not present

## 2019-08-24 DIAGNOSIS — K219 Gastro-esophageal reflux disease without esophagitis: Secondary | ICD-10-CM | POA: Diagnosis present

## 2019-08-24 DIAGNOSIS — F329 Major depressive disorder, single episode, unspecified: Secondary | ICD-10-CM | POA: Insufficient documentation

## 2019-08-24 DIAGNOSIS — R1030 Lower abdominal pain, unspecified: Secondary | ICD-10-CM

## 2019-08-24 DIAGNOSIS — K279 Peptic ulcer, site unspecified, unspecified as acute or chronic, without hemorrhage or perforation: Secondary | ICD-10-CM

## 2019-08-24 DIAGNOSIS — K3189 Other diseases of stomach and duodenum: Secondary | ICD-10-CM | POA: Diagnosis not present

## 2019-08-24 DIAGNOSIS — R1310 Dysphagia, unspecified: Secondary | ICD-10-CM | POA: Diagnosis not present

## 2019-08-24 DIAGNOSIS — K2970 Gastritis, unspecified, without bleeding: Secondary | ICD-10-CM | POA: Diagnosis not present

## 2019-08-24 DIAGNOSIS — Z6823 Body mass index (BMI) 23.0-23.9, adult: Secondary | ICD-10-CM | POA: Diagnosis not present

## 2019-08-24 DIAGNOSIS — Z8719 Personal history of other diseases of the digestive system: Secondary | ICD-10-CM | POA: Insufficient documentation

## 2019-08-24 DIAGNOSIS — K315 Obstruction of duodenum: Secondary | ICD-10-CM | POA: Diagnosis not present

## 2019-08-24 DIAGNOSIS — K573 Diverticulosis of large intestine 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 or abscess without bleeding: Secondary | ICD-10-CM | POA: Diagnosis not present

## 2019-08-24 DIAGNOSIS — R197 Diarrhea, unspecified: Secondary | ICD-10-CM

## 2019-08-24 DIAGNOSIS — K921 Melena: Secondary | ICD-10-CM

## 2019-08-24 DIAGNOSIS — K2980 Duodenitis without bleeding: Secondary | ICD-10-CM | POA: Insufficient documentation

## 2019-08-24 DIAGNOSIS — K2950 Unspecified chronic gastritis without bleeding: Secondary | ICD-10-CM | POA: Diagnosis not present

## 2019-08-24 DIAGNOSIS — K449 Diaphragmatic hernia without obstruction or gangrene: Secondary | ICD-10-CM | POA: Diagnosis not present

## 2019-08-24 DIAGNOSIS — R195 Other fecal abnormalities: Secondary | ICD-10-CM

## 2019-08-24 DIAGNOSIS — J449 Chronic obstructive pulmonary disease, unspecified: Secondary | ICD-10-CM | POA: Diagnosis not present

## 2019-08-24 HISTORY — PX: COLONOSCOPY WITH PROPOFOL: SHX5780

## 2019-08-24 HISTORY — PX: BRAVO PH STUDY: SHX5421

## 2019-08-24 HISTORY — PX: BIOPSY: SHX5522

## 2019-08-24 HISTORY — PX: ESOPHAGOGASTRODUODENOSCOPY (EGD) WITH PROPOFOL: SHX5813

## 2019-08-24 HISTORY — DX: Personal history of other specified conditions: Z87.898

## 2019-08-24 SURGERY — ESOPHAGOGASTRODUODENOSCOPY (EGD) WITH PROPOFOL
Anesthesia: Monitor Anesthesia Care

## 2019-08-24 MED ORDER — LACTATED RINGERS IV SOLN
INTRAVENOUS | Status: DC
  Administered 2019-08-24: 09:00:00 via INTRAVENOUS

## 2019-08-24 MED ORDER — PROPOFOL 500 MG/50ML IV EMUL
INTRAVENOUS | Status: DC | PRN
  Administered 2019-08-24: 150 ug/kg/min via INTRAVENOUS

## 2019-08-24 MED ORDER — SODIUM CHLORIDE 0.9 % IV SOLN: INTRAVENOUS | Status: DC

## 2019-08-24 MED ORDER — PROPOFOL 10 MG/ML IV BOLUS
INTRAVENOUS | Status: DC | PRN
  Administered 2019-08-24 (×2): 20 mg via INTRAVENOUS

## 2019-08-24 MED ORDER — LIDOCAINE 2% (20 MG/ML) 5 ML SYRINGE
INTRAMUSCULAR | Status: DC | PRN
  Administered 2019-08-24: 40 mg via INTRAVENOUS
  Administered 2019-08-24: 60 mg via INTRAVENOUS

## 2019-08-24 MED ORDER — PROPOFOL 500 MG/50ML IV EMUL
INTRAVENOUS | Status: AC
  Filled 2019-08-24: qty 50

## 2019-08-24 SURGICAL SUPPLY — 24 items

## 2019-08-24 NOTE — Op Note (Signed)
[facility] Patient Name: Lindsay Frost Procedure Date: 08/24/2019 MRN: [id number] Attending MD: Gerrit Heck , MD Date of Birth: May 26, 1956 CSN: [id number] Age: 63 Admit Type: Outpatient Procedure:                Upper GI endoscopy Indications:              Esophageal reflux, Exclusion of reflux esophagitis,                            Hiatal hernia, Diarrhea, Weight loss                           63 year old female with a longstanding history of                            reflux, characterized by heartburn, regurgitation,                            sour brash. Incompletely controlled with Dexilant,                            presents today for endoscopic evaluation along with                            Bravo placement (on PPI). Additionally, she has a                            history of antral/duodenal bulb ulcers and post                            bulbar stenosis requiring dilation in 2013. Last                            EGD in 07/2018 demonstrated a normal-appearing                            esophagus, 4 gastric ulcers, 1 duodenal ulcer, and                            antral/duodenal bulb deformity with pyloric                            stenosis that was easily traversed. Separately, she                            endorses change in bowel habits and intermittent                            BRBPR. Also with 85#unintentional weight loss over                            the last few years. Providers:                Gerrit Heck, MD, Jobe Igo, RN, Narda Rutherford  [place], Technician Referring MD:              Medicines:                Monitored Anesthesia Care Complications:            No immediate complications. Estimated Blood Loss:     Estimated blood loss was minimal. Procedure:                Pre-Anesthesia Assessment:                           - Prior to the procedure, a History and Physical                            was  performed, and patient medications and                            allergies were reviewed. The patient's tolerance of                            previous anesthesia was also reviewed. The risks                            and benefits of the procedure and the sedation                            options and risks were discussed with the patient.                            All questions were answered, and informed consent                            was obtained. Prior Anticoagulants: The patient has                            taken no previous anticoagulant or antiplatelet                            agents. ASA Grade Assessment: III - A patient with                            severe systemic disease. After reviewing the risks                            and benefits, the patient was deemed in                            satisfactory condition to undergo the procedure.                           After obtaining informed consent, the endoscope was                            passed under direct vision. Throughout the  procedure, the patient's blood pressure, pulse, and                            oxygen saturations were monitored continuously. The                            [id number]) Olympus gastroscope was                            introduced through the mouth, and advanced to the                            second part of duodenum. The upper GI endoscopy was                            accomplished without difficulty. The patient                            tolerated the procedure well. Scope In: Scope Out: Findings:      Esophagogastric landmarks were identified: the Z-line was found at 36       cm, the gastroesophageal junction was found at 36 cm and the site of       hiatal narrowing was found at 39 cm from the incisors.      A 3 cm hiatal hernia was present.      The gastroesophageal flap valve was visualized endoscopically and       classified as Hill Grade  III (minimal fold, loose to endoscope, hiatal       hernia likely).      LA Grade A (one or more mucosal breaks less than 5 mm, not extending       between tops of 2 mucosal folds) esophagitis with no bleeding was found       36 cm from the incisors. The BRAVO capsule with delivery system was       introduced through the mouth and advanced into the esophagus, such that       the BRAVO pH capsule was positioned 30 cm from the incisors, which was 6       cm proximal to the GE junction. The BRAVO pH capsule was then deployed       and attached to the esophageal mucosa. The delivery system was then       withdrawn. Endoscopy was utilized for probe placement and diagnostic       evaluation.      The upper third of the esophagus and middle third of the esophagus were       normal.      Diffuse mild inflammation characterized by erythema was found in the       gastric fundus, in the gastric body, at the incisura and in the gastric       antrum. There was a single, non-bleeding, shallow ulcer in the antrum.       Several mucosal biopsies were taken with a cold forceps for Helicobacter       pylori testing. There was luminal deformity of the pre-pyloric antrum       and duodenal bulb. Estimated blood loss was minimal.      A medium non-bleeding diverticulum was found in the duodenal bulb.  An acquired benign-appearing, intrinsic moderate stenosis was found in       the duodenal bulb and was traversed with gentle endoscope pressure.       Small mucosal rent noted after endoscope advancement. Estimated blood       loss was minimal. Given the location immediately adjacent to a       diverticulum, further balloon dilation was not attempted during this       procedure.      The second portion of the duodenum was normal. Biopsies for histology       were taken with a cold forceps for evaluation of celiac disease.       Estimated blood loss was minimal. Impression:               - Esophagogastric  landmarks identified.                           - 3 cm hiatal hernia.                           - Gastroesophageal flap valve classified as Hill                            Grade III (minimal fold, loose to endoscope, hiatal                            hernia likely).                           - LA Grade A reflux esophagitis with no bleeding.                           - Normal upper third of esophagus and middle third                            of esophagus.                           - Gastritis. Biopsied.                           - Non-bleeding duodenal diverticulum.                           - Acquired duodenal stenosis.                           - Normal second portion of the duodenum. Biopsied.                           - The BRAVO pH capsule was deployed (study                            conducted ON PPI to evaluate for breakthrough                            reflux despite Dexilant).                           -  Luminal deformity noted in the prepyloric antrum                            as well as the duodenal bulb. There was a medium                            size diverticulum noted in the duodenal bulb, with                            a moderate post bulbar stenosis. The stenosis was                            traversed, but require gentle endoscopic pressure                            to advance, with a small mucosal rent noted. Given                            the location immediately adjacent to a                            diverticulum, further balloon dilation was not                            attempted during this procedure, in favor of                            discussion re: surgical intervention.                           Given clinical presentation, persistent reflux,                            hiatal hernia, post bulbar stenosis and duodenal                            bulb diverticulum, have increasing clinical                            suspicion for gastric outlet  obstruction which is                            manifesting as worsening reflux                            symptoms/regurgitation. Will review Bravo results                            and plan on full discussion in the clinic of                            medical and surgical options, to include hernia  repair with fundoplication, hernia repair with                            gastric bypass, and medical management alone. To                            schedule follow-up appointment with me following                            Bravo results to review. Moderate Sedation:      Not Applicable - Patient had care per Anesthesia. Recommendation:           - Patient has a contact number available for                            emergencies. The signs and symptoms of potential                            delayed complications were discussed with the                            patient. Return to normal activities tomorrow.                            Written discharge instructions were provided to the                            patient.                           - Resume previous diet today.                           - Continue present medications.                           - Await pathology results.                           - Return to GI clinic at appointment to be                            scheduled.                           -Will instruct on use of Bravo equipment.                           - Proceed with colonoscopy today. Procedure Code(s):        --- Professional ---                           [id number], Esophagogastroduodenoscopy, flexible,                            transoral; with biopsy, single or multiple Diagnosis Code(s):        ---  Professional ---                           K44.9, Diaphragmatic hernia without obstruction or                            gangrene                           K21.00, Gastro-esophageal reflux disease with                             esophagitis, without bleeding                           K29.70, Gastritis, unspecified, without bleeding                           K31.5, Obstruction of duodenum                           R19.7, Diarrhea, unspecified                           R63.4, Abnormal weight loss                           K57.10, Diverticulosis of small intestine without                            perforation or abscess without bleeding CPT copyright 2019 American [employer]. All rights reserved. The codes documented in this report are preliminary and upon coder review may  be revised to meet current compliance requirements. Gerrit Heck, MD 08/24/2019 10:17:31 AM Number of Addenda: 0

## 2019-08-24 NOTE — H&P (Signed)
P  Chief Complaint:    GERD, lower abdominal pain, change in bowel habits  GI History: Lindsay Frost is a 63 y.o. female referred to the [facility] for evaluation of multiple GI symptoms, to include reflux, lower abdominal pain, and change in bowel habits.  She describes a longstanding history of reflux, characterized by HB, regurgitation, sourbrash. Intermittent dry cough. +dysphagia for the last few years, pointing to lower sternal border. Reflux sxs have been worsening in the last year. Increased postprandial and nocturnal HB. Worse with fruit, spicy food. Takes Maalox for breakthrough sxs, with increasing frequency. Has ongoing symptoms despite Dexilant. Has previously trialed Carafate, Tums, Prilosec.   Previously followed by Dr. 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  Additionally, she endorses changes in bowel habits, alternating between diarrhea and constipation for the last 3-4 months. No preceding change in medications, diet, or activity. Intermittent BRB and marroon colored blood mixed in stool over last few months. Intermittent lower abdominal cramping pain over this time. Not reliably improved with BM. Trialed stool softeners without change.    Has lost 85# over the last few years.  Unintentional. No night sweats, fever. CT abd/pelvis in 08/2018 without intraabodminal process.  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  Reports some abnormality on prior spine imaging, and has been following with a spine surgeon.   Normal CBC and BMP in 04/2019.  Normal CMP in 06/2018. ccy in 04/2018.   Endoscopic history: -EGD (07/2018, Dr. Benson Norway): Normal esophagus, 4 gastric ulcers, 1 duodenal ulcer, antral/duodenal bulb deformity with pyloric stenosis but easily traversable -EGD (12/2015, Dr. Benson Norway): LA Grade D esophagitis, 3 cm HH, 2 gastric ulcers, gastritis, mild duodenal stenosis.  Started on Dexilant - EGD  (04/2014, Dr. Benson Norway): 3 cm HH, healing antral ulcer, duodenal bulb deformity with nonobstructive stricture -EGD (02/2014, Dr. Benson Norway): 3 cm HH, multiple clean-based ulcers in the antrum, posterior duodenal bulb stenosis but traversable -EGD (03/2012, Dr. Benson Norway): Antral ulcers, post bulbar stricture, dilated with 15 mm TTS balloon with mucosal rent -EGD (03/2012, Dr. Benson Norway): Antral ulcers, post bulbar stricture which was not traversed, unsuccessful balloon dilation -Colonoscopy (07/2010, Dr. Benson Norway): Normal.  Repeat 10 years   HPI:     Patient is a 63 y.o. female presenting today for EGD with biopsies, Bravo placement, and possible dilation, along with colonoscopy with biopsies, as outlined above.  No significant change in clinical history since last appointment.  No new medications.  Symptoms all essentially unchanged from appointment in 06/2019.  Review of systems:     No chest pain, no SOB, no fevers, no urinary sx   Past Medical History:  Diagnosis Date  . Arthritis   . Back pain    arthritis  . Depression    takes cymbalta daily  . Gallstones   . GERD (gastroesophageal reflux disease)    takes Omeprazole daily  . Headache(784.0)    migraines  . Heart murmur   . History of gastric ulcer   . History of migraine    last one about 2wks ago-takes Imitrex prn  . History of seizures   . Joint pain   . Joint swelling   . Meningioma (Babb)   . Ulcer     Patient's surgical history, family medical history, social history, medications and allergies were all reviewed in Epic    Current Facility-Administered Medications  Medication Dose Route Frequency Provider Last Rate Last Dose  . 0.9 %  sodium chloride infusion   Intravenous Continuous Cherity Blickenstaff V, DO      . lactated ringers infusion   Intravenous Continuous Lequan Dobratz V, DO 10 mL/hr at 08/24/19 0854      Physical Exam:     BP (!) 147/64   Pulse 74   Temp 97.8 F (36.6 C) (Oral)   Resp 14   Ht 5\' 4"  (1.626 m)   Wt  61.2 kg   SpO2 100%   BMI 23.17 kg/m   GENERAL:  Pleasant female in NAD PSYCH: : Cooperative, normal affect EENT:  conjunctiva pink, mucous membranes moist, neck supple without masses CARDIAC:  RRR, no murmur heard, no peripheral edema PULM: Normal respiratory effort, lungs CTA bilaterally, no wheezing ABDOMEN:  Nondistended, soft, nontender. 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  Discussed evaluation/treatment strategies, to include changing acid suppression therapy, increasing dose, evaluation for erosive esophagitis, etc. with EGD, or pH/impedance vs Bravo.  She opted for the latter, which I feel is very reasonable  - EGD with Bravo at [facility] ([facility]) today to evaluate for continued reflux despite Dexilant.  Do not take OTC antacids for the duration of the study - Evaluate for erosive esophagitis, LES laxity and size of hernia at time of EGD - Duodenal and gastric biopsies - Additional treatment pending study results - Assess for patency of previously described post bulbar stenosis at time of EGD -Possible esophageal dilation pending endoscopic findings  6) Change in bowel habits 7) Hematochezia 8) Lower abdominal pain 9) Weight loss  - Fiber supplement - Ensure adequate hydration - Colonoscopy w/ bxs to assess for mucosal/luminal etiology -Objectively, her weight is largely the same since 07/2018 (#135 then, 134 today).  However, baseline was low 200s, as recently as 06/2016, then per record review, had lost 70-80 between then and 07/2018. - CT abdomen/pelvis in 08/2017 otherwise  unrevealing.  Normal CBC and BMP. - Assess for malabsorption with duodenal biopsies at time of EGD - Random and directed colonic biopsies - Given need for EGD with Bravo to be completed at [facility], will plan for colonoscopy at [facility] today as well          [place] ,DO, FACG 08/24/2019, 9:02 AM

## 2019-08-24 NOTE — Anesthesia Postprocedure Evaluation (Signed)
Anesthesia Post Note  Patient: Lindsay Frost  Procedure(s) Performed: ESOPHAGOGASTRODUODENOSCOPY (EGD) WITH PROPOFOL/WITH BRAVO (N/A ) COLONOSCOPY WITH PROPOFOL (N/A ) BIOPSY     Patient location during evaluation: Endoscopy Anesthesia Type: MAC Level of consciousness: awake and alert Pain management: pain level controlled Vital Signs Assessment: post-procedure vital signs reviewed and stable Respiratory status: spontaneous breathing, nonlabored ventilation and respiratory function stable Cardiovascular status: blood pressure returned to baseline and stable Postop Assessment: no apparent nausea or vomiting Anesthetic complications: no    Last Vitals:  Vitals:   08/24/19 0848 08/24/19 1004  BP: (!) 147/64 (!) 114/55  Pulse: 74 66  Resp: 14 18  Temp: 36.6 C (!) 36.4 C  SpO2: 100% 98%    Last Pain:  Vitals:   08/24/19 1010  TempSrc:   PainSc: 0-No pain                 Lidia Collum

## 2019-08-24 NOTE — Transfer of Care (Signed)
Immediate Anesthesia Transfer of Care Note  Patient: Lindsay Frost  Procedure(s) Performed: ESOPHAGOGASTRODUODENOSCOPY (EGD) WITH PROPOFOL/WITH BRAVO (N/A ) COLONOSCOPY WITH PROPOFOL (N/A ) BIOPSY  Patient Location: PACU and Endoscopy Unit  Anesthesia Type:MAC  Level of Consciousness: awake, drowsy and patient cooperative  Airway & Oxygen Therapy: spontaneously breathing with [place] O2 Post-op Assessment: Report given to RN and Post -op Vital signs reviewed and stable  Post vital signs: Reviewed and stable  Last Vitals:  Vitals Value Taken Time  BP 114/55 08/24/19 1003  Temp    Pulse 64 08/24/19 1005  Resp 18 08/24/19 1005  SpO2 99 % 08/24/19 1005  Vitals shown include unvalidated device data.  Last Pain:  Vitals:   08/24/19 1003  TempSrc: Oral  PainSc:          Complications: No apparent anesthesia complications

## 2019-08-24 NOTE — Anesthesia Preprocedure Evaluation (Addendum)
Anesthesia Evaluation  Patient identified by MRN, date of birth, ID band Patient awake    Reviewed: Allergy & Precautions, NPO status , Patient's Chart, lab work & pertinent test results  History of Anesthesia Complications Negative for: history of anesthetic complications  Airway Mallampati: II  TM Distance: >3 FB Neck ROM: Full    Dental  (+) Teeth Intact   Pulmonary neg pulmonary ROS,    Pulmonary exam normal        Cardiovascular hypertension, Normal cardiovascular exam     Neuro/Psych  Headaches, Seizures -,  PSYCHIATRIC DISORDERS Depression meningioma    GI/Hepatic Neg liver ROS, PUD, GERD  ,  Endo/Other  negative endocrine ROS  Renal/GU negative Renal ROS  negative genitourinary   Musculoskeletal  (+) Arthritis ,   Abdominal   Peds  Hematology negative hematology ROS (+)   Anesthesia Other Findings   Reproductive/Obstetrics                            Anesthesia Physical Anesthesia Plan  ASA: II  Anesthesia Plan: MAC   Post-op Pain Management:    Induction: Intravenous  PONV Risk Score and Plan: Propofol infusion, TIVA and Treatment may vary due to age or medical condition  Airway Management Planned: Natural Airway, Nasal Cannula and Simple Face Mask  Additional Equipment: None  Intra-op Plan:   Post-operative Plan:   Informed Consent: I have reviewed the patients History and Physical, chart, labs and discussed the procedure including the risks, benefits and alternatives for the proposed anesthesia with the patient or authorized representative who has indicated his/her understanding and acceptance.       Plan Discussed with:   Anesthesia Plan Comments:        Anesthesia Quick Evaluation

## 2019-08-24 NOTE — Op Note (Signed)
[facility] Patient Name: Lindsay Frost Procedure Date: 08/24/2019 MRN: [id number] Attending MD: Gerrit Heck , MD Date of Birth: 1955-12-11 CSN: [id number] Age: 63 Admit Type: Outpatient Procedure:                Colonoscopy Indications:              Hematochezia, Change in bowel habits, Diarrhea,                            Weight loss. Last colonoscopy was in 2011 and                            normal. Providers:                Gerrit Heck, MD, Jobe Igo, RN, Cherylynn Ridges, Technician Good,Melaanie CRNA Referring MD:              Medicines:                Monitored Anesthesia Care Complications:            No immediate complications. Estimated Blood Loss:     Estimated blood loss was minimal. Procedure:                Pre-Anesthesia Assessment:                           - Prior to the procedure, a History and Physical                            was performed, and patient medications and                            allergies were reviewed. The patient's tolerance of                            previous anesthesia was also reviewed. The risks                            and benefits of the procedure and the sedation                            options and risks were discussed with the patient.                            All questions were answered, and informed consent                            was obtained. Prior Anticoagulants: The patient has                            taken no previous anticoagulant or antiplatelet                            agents. ASA Grade  Assessment: III - A patient with                            severe systemic disease. After reviewing the risks                            and benefits, the patient was deemed in                            satisfactory condition to undergo the procedure.                           After obtaining informed consent, the colonoscope                            was passed under  direct vision. Throughout the                            procedure, the patient's blood pressure, pulse, and                            oxygen saturations were monitored continuously. The                            [id number]) Olympus pediatric colonscope                            was introduced through the anus and advanced to the                            the cecum, identified by appendiceal orifice and                            ileocecal valve. The colonoscopy was performed                            without difficulty. The patient tolerated the                            procedure well. The quality of the bowel                            preparation was fair. The ileocecal valve,                            appendiceal orifice, and rectum were photographed. Scope In: 9:37:17 AM Scope Out: [id number] AM Scope Withdrawal Time: 0 hours 15 minutes 16 seconds  Total Procedure Duration: 0 hours 18 minutes 51 seconds  Findings:      The perianal and digital rectal examinations were normal.      A few small-mouthed diverticula were found in the sigmoid colon.      A moderate amount of semi-liquid stool and adherent stool was found       scattered throughout the colon, interfering with visualization. Lavage  of the area was performed using copious amounts of sterile water,       resulting in clearance with adequate visualization.      The visualized mucosa was otherwise normal appearing throughout the       colon. No areas of mucosal erythema, edema, erosions, or ulceration       noted. Biopsies for histology were taken with a cold forceps from the       right colon and left colon for evaluation of microscopic colitis.       Estimated blood loss was minimal.      The retroflexed view of the distal rectum and anal verge was normal and       showed no anal or rectal abnormalities. Impression:               - Preparation of the colon was fair.                           -  Diverticulosis in the sigmoid colon.                           - Stool in the entire examined colon.                           - Normal mucosa in the entire examined colon.                            Biopsied.                           - The distal rectum and anal verge are normal on                            retroflexion view. Moderate Sedation:      Not Applicable - Patient had care per Anesthesia. Recommendation:           - Patient has a contact number available for                            emergencies. The signs and symptoms of potential                            delayed complications were discussed with the                            patient. Return to normal activities tomorrow.                            Written discharge instructions were provided to the                            patient.                           - Resume previous diet.                           - Continue present medications.                           -  Await pathology results.                           - Repeat colonoscopy in 3 years because the bowel                            preparation was suboptimal.                           - Return to GI office at appointment to be                            scheduled. Procedure Code(s):        --- Professional ---                           [phone], Colonoscopy, flexible; with biopsy, single                            or multiple Diagnosis Code(s):        --- Professional ---                           K92.1, Melena (includes Hematochezia)                           R19.4, Change in bowel habit                           R19.7, Diarrhea, unspecified                           R63.4, Abnormal weight loss                           K57.30, Diverticulosis of large intestine without                            perforation or abscess without bleeding CPT copyright 2019 American Medical Association. All rights reserved. The codes documented in this report are preliminary  and upon coder review may  be revised to meet current compliance requirements. Gerrit Heck, MD 08/24/2019 10:25:11 AM Number of Addenda: 0

## 2019-08-24 NOTE — Anesthesia Procedure Notes (Signed)
Procedure Name: MAC Date/Time: 08/24/2019 9:18 AM Performed by: Eben Burow, CRNA Pre-anesthesia Checklist: Patient identified, Emergency Drugs available, Suction available, Patient being monitored and Timeout performed Oxygen Delivery Method: Nasal cannula Dental Injury: Teeth and Oropharynx as per pre-operative assessment

## 2019-08-24 NOTE — Discharge Instructions (Signed)
YOU HAD AN ENDOSCOPIC PROCEDURE TODAY: Refer to the procedure report and other information in the discharge instructions given to you for any specific questions about what was found during the examination. If this information does not answer your questions, please call [place] office at [phone] to clarify.  ° °YOU SHOULD EXPECT: Some feelings of bloating in the abdomen. Passage of more gas than usual. Walking can help get rid of the air that was put into your GI tract during the procedure and reduce the bloating. If you had a lower endoscopy (such as a colonoscopy or flexible sigmoidoscopy) you may notice spotting of blood in your stool or on the toilet paper. Some abdominal soreness may be present for a day or two, also. ° °DIET: Your first meal following the procedure should be a light meal and then it is ok to progress to your normal diet. A half-sandwich or bowl of soup is an example of a good first meal. Heavy or fried foods are harder to digest and may make you feel nauseous or bloated. Drink plenty of fluids but you should avoid alcoholic beverages for 24 hours. If you had a esophageal dilation, please see attached instructions for diet.   ° °ACTIVITY: Your care partner should take you home directly after the procedure. You should plan to take it easy, moving slowly for the rest of the day. You can resume normal activity the day after the procedure however YOU SHOULD NOT DRIVE, use power tools, machinery or perform tasks that involve climbing or major physical exertion for 24 hours (because of the sedation medicines used during the test).  ° °SYMPTOMS TO REPORT IMMEDIATELY: °A gastroenterologist can be reached at any hour. Please call [phone]  for any of the following symptoms:  °Following lower endoscopy (colonoscopy, flexible sigmoidoscopy) °Excessive amounts of blood in the stool  °Significant tenderness, worsening of abdominal pains  °Swelling of the abdomen that is new, acute  °Fever of 100° or  higher  °Following upper endoscopy (EGD, EUS, ERCP, esophageal dilation) °Vomiting of blood or coffee ground material  °New, significant abdominal pain  °New, significant chest pain or pain under the shoulder blades  °Painful or persistently difficult swallowing  °New shortness of breath  °Black, tarry-looking or red, bloody stools ° °FOLLOW UP:  °If any biopsies were taken you will be contacted by phone or by letter within the next 1-3 weeks. Call [phone]  if you have not heard about the biopsies in 3 weeks.  °Please also call with any specific questions about appointments or follow up tests. ° °

## 2019-08-24 NOTE — Interval H&P Note (Signed)
History and Physical Interval Note:  08/24/2019 9:12 AM  Lindsay Frost  has presented today for surgery, with the diagnosis of GERD, HH,Lower abdomina pain, change in stools Hematochezia weight loss.  The various methods of treatment have been discussed with the patient and family. After consideration of risks, benefits and other options for treatment, the patient has consented to  Procedure(s) with comments: ESOPHAGOGASTRODUODENOSCOPY (EGD) WITH PROPOFOL/WITH BRAVO (N/A) - egd WITH bRAVO COLONOSCOPY WITH PROPOFOL (N/A) as a surgical intervention.  The patient's history has been reviewed, patient examined, no change in status, stable for surgery.  I have reviewed the patient's chart and labs.  Questions were answered to the patient's satisfaction.     Dominic Pea [name]

## 2019-08-25 ENCOUNTER — Other Ambulatory Visit: Payer: Self-pay

## 2019-08-25 LAB — SURGICAL PATHOLOGY

## 2019-08-26 ENCOUNTER — Encounter: Payer: Self-pay | Admitting: Gastroenterology

## 2019-08-27 ENCOUNTER — Encounter: Payer: Self-pay | Admitting: *Deleted

## 2019-09-03 ENCOUNTER — Ambulatory Visit (INDEPENDENT_AMBULATORY_CARE_PROVIDER_SITE_OTHER): Payer: Medicare Other | Admitting: Medical

## 2019-09-03 ENCOUNTER — Encounter: Payer: Self-pay | Admitting: *Deleted

## 2019-09-03 ENCOUNTER — Other Ambulatory Visit: Payer: Self-pay | Admitting: Medical

## 2019-09-03 VITALS — Temp 98.0°F

## 2019-09-03 DIAGNOSIS — R05 Cough: Secondary | ICD-10-CM

## 2019-09-03 DIAGNOSIS — J301 Allergic rhinitis due to pollen: Secondary | ICD-10-CM | POA: Diagnosis not present

## 2019-09-03 DIAGNOSIS — J0100 Acute maxillary sinusitis, unspecified: Secondary | ICD-10-CM

## 2019-09-03 DIAGNOSIS — R059 Cough, unspecified: Secondary | ICD-10-CM

## 2019-09-03 MED ORDER — CEFDINIR 300 MG PO CAPS: 300.0000 mg | ORAL_CAPSULE | Freq: Two times a day (BID) | ORAL | 0 refills | Status: DC

## 2019-09-03 MED ORDER — FLUTICASONE PROPIONATE 50 MCG/ACT NA SUSP: 2.0000 | Freq: Every day | NASAL | 1 refills | Status: DC

## 2019-09-03 MED ORDER — BENZONATATE 100 MG PO CAPS: 100.0000 mg | ORAL_CAPSULE | Freq: Three times a day (TID) | ORAL | 0 refills | Status: DC | PRN

## 2019-09-03 NOTE — Progress Notes (Signed)
   Subjective:    Patient ID: Lindsay Frost, female    DOB: [birth year], 63 y.o.   MRN: [id number]  HPI  Virtual Visit via Telephone Note  I connected with Lindsay Frost on 09/03/19 at  2:40 PM EST by telephone and verified that I am speaking with the correct person using two identifiers.  Location: Patient: home Provider: office   I discussed the limitations, risks, security and privacy concerns of performing an evaluation and management service by telephone and the availability of in person appointments. I also discussed with the patient that there may be a patient responsible charge related to this service. The patient expressed understanding and agreed to proceed.   History of Present Illness:   Pt had about 10 days of sinus congestion, yellow blood tinged mucus and sinus ha. Pt had moderate cough that is dry. Last month she had sinus infection. Antibiotic used last month upset her stomach  Pt has no fever, no chills, no sweat or body aches.  Can smell.    Observations/Objective: General- no acute distress, pleasant, alert, oriented. Normal speech. Sounds nasal congested. heent- pt states when she palpates over maxillary sinus has some pain.  Assessment and Plan: Recent signs and symptoms that appear  to represent sinus infection.  Note also history of recurrent sinus pressures in the past on review of chart.  We will go ahead and prescribe cefdinir antibiotic, Flonase nasal spray and benzonatate for cough.  Counseled patient that she likely does have sinus infection but in light of the Covid pandemic we need to watch her closely and make sure that her signs and symptoms improve with above treatment.  If symptoms persist or worsen then in that event would recommend her getting testing for Covid.  Over the next 4 to 5 days asked her to quarantine verify that symptoms improving with above treatment.  If not then she would get tested and we would advise extending quarantine.   Patient has information to get scheduled for Covid testing is necessary.  She does get scheduled asked her to update me so I can look out for the report.  Follow-up 3 to 5 days by call update on my chart update.  Asked her to update me before Christmas.  Follow Up Instructions:    I discussed the assessment and treatment plan with the patient. The patient was provided an opportunity to ask questions and all were answered. The patient agreed with the plan and demonstrated an understanding of the instructions.   The patient was advised to call back or seek an in-person evaluation if the symptoms worsen or if the condition fails to improve as anticipated.  I provided 15  minutes of non-face-to-face time during this encounter.   Mackie Pai, PA-C    Review of Systems  Constitutional: Negative for chills, fatigue and fever.  HENT: Positive for congestion, sinus pressure and sinus pain. Negative for ear pain.   Respiratory: Positive for cough. Negative for chest tightness, shortness of breath and wheezing.   Cardiovascular: Negative for chest pain and palpitations.  Gastrointestinal: Negative for abdominal pain.  Musculoskeletal: Negative for back pain and myalgias.  Skin: Negative for rash.  Neurological: Negative for dizziness, syncope and headaches.  Hematological: Negative for adenopathy. Does not bruise/bleed easily.  Psychiatric/Behavioral: Negative for behavioral problems and confusion. The patient is not nervous/anxious.        Objective:   Physical Exam        Assessment & Plan:

## 2019-09-03 NOTE — Telephone Encounter (Signed)
Last OV 09/03/19 Last refill 09/03/19

## 2019-09-03 NOTE — Patient Instructions (Signed)
Recent signs and symptoms that appear  to represent sinus infection.  Note also history of recurrent sinus pressures in the past on review of chart.  We will go ahead and prescribe cefdinir antibiotic, Flonase nasal spray and benzonatate for cough.  Counseled patient that she likely does have sinus infection but in light of the Covid pandemic we need to watch her closely and make sure that her signs and symptoms improve with above treatment.  If symptoms persist or worsen then in that event would recommend her getting testing for Covid.  Over the next 4 to 5 days asked her to quarantine verify that symptoms improving with above treatment.  If not then she would get tested and we would advise extending quarantine.  Patient has information to get scheduled for Covid testing is necessary.  She does get scheduled asked her to update me so I can look out for the report.  Follow-up 3 to 5 days by call update on my chart update.  Asked her to update me before Christmas.

## 2019-09-16 ENCOUNTER — Other Ambulatory Visit: Payer: Self-pay | Admitting: *Deleted

## 2019-09-16 MED ORDER — AMLODIPINE BESYLATE 5 MG PO TABS: 5.0000 mg | ORAL_TABLET | Freq: Every day | ORAL | 0 refills | Status: DC

## 2019-09-17 HISTORY — PX: EYE SURGERY: SHX253

## 2019-09-28 ENCOUNTER — Other Ambulatory Visit: Payer: Self-pay

## 2019-09-28 ENCOUNTER — Encounter: Payer: Self-pay | Admitting: Gastroenterology

## 2019-09-28 ENCOUNTER — Telehealth (INDEPENDENT_AMBULATORY_CARE_PROVIDER_SITE_OTHER): Payer: Medicare Other | Admitting: Gastroenterology

## 2019-09-28 VITALS — Ht 64.0 in | Wt 130.0 lb

## 2019-09-28 DIAGNOSIS — R195 Other fecal abnormalities: Secondary | ICD-10-CM

## 2019-09-28 DIAGNOSIS — K315 Obstruction of duodenum: Secondary | ICD-10-CM | POA: Diagnosis not present

## 2019-09-28 DIAGNOSIS — K571 Diverticulosis of small intestine 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 or abscess without bleeding: Secondary | ICD-10-CM

## 2019-09-28 DIAGNOSIS — K2100 Gastro-esophageal reflux disease with esophagitis, without bleeding: Secondary | ICD-10-CM

## 2019-09-28 DIAGNOSIS — K449 Diaphragmatic hernia without obstruction or gangrene: Secondary | ICD-10-CM

## 2019-09-28 DIAGNOSIS — K2970 Gastritis, unspecified, without bleeding: Secondary | ICD-10-CM

## 2019-09-28 DIAGNOSIS — Z03818 Encounter for observation for suspected exposure to other biological agents ruled out: Secondary | ICD-10-CM | POA: Diagnosis not present

## 2019-09-28 NOTE — Progress Notes (Signed)
Chief Complaint: GERD, lower abdominal pain, change in bowel habits  GI History: Lindsay Stride Burtneris a 64 y.o.femalefollows in the [facility]ology Clinic for multiple GI symptoms, to include reflux, lower abdominal pain, and change in bowel habits.  She describes a longstanding history of reflux, characterized byHB, regurgitation, sourbrash. Intermittent dry cough. +dysphagia for the last few years, pointing to lower sternal border. Reflux sxs have been worsening in the last year. Increased postprandial and nocturnal HB. Worse with fruit, spicy food. Takes Maalox for breakthrough sxs, with increasing frequency. Has ongoing symptoms despite Dexilant.Has previously trialed Carafate, Tums, Prilosec.  Previously followed by Dr. Barbette Reichmann GERD along with antral/duodenal bulb ulcers and post bulbar stenosis, requiring dilation in 2013.  Additionally, she endorseschanges in bowel habits, alternating between diarrhea and constipation since mid 2020. No preceding change in medications, diet, or activity. Intermittent BRB and marroon colored blood mixed in stool over last few months. Intermittent lower abdominal cramping pain over this time. Not reliably improved with BM. Trialed stool softeners without change.   Has lost 85# over thelast few years. Unintentional. No night sweats, fever. CT abd/pelvis in 08/2018 without intraabodminal process.Benign hemangioma on MRI brain in 2019. Reports some abnormality on prior spine imaging, and has been following with a spine surgeon.   Normal CBC and BMP in 04/2019.Normal CMP in 06/2018. ccyin 04/2018.   Endoscopic history: -EGD with Bravo (08/2019, Dr. Bryan Lemma; on PPI): 3 cm HH, Hill grade 3 valve, LA Grade A esophagitis, mild non-H. pylori gastritis with single ulcer in antrum (biopsy: Gastritis with intestinal metaplasia), duodenal bulb diverticulum, stenosis in duodenal bulb dilated with endoscope alone -Bravo results  (08/2019 on [place]): Significant esophageal acid exposure, DeMeester score 24.5, pH <4 9.6% of time.  Significant reflux despite Dexilant -Colonoscopy (08/2019, Dr. Bryan Lemma): Sigmoid diverticulosis, moderate stool, biopsies negative for [facility].  Repeat in 3 years due to suboptimal prep -EGD (07/2018, Dr. Benson Norway): Normal esophagus, 4 gastric ulcers, 1 duodenal ulcer, antral/duodenal bulb deformity with pyloric stenosis but easily traversable -EGD (12/2015, Dr. Benson Norway): LA Grade D esophagitis, 3 cm HH, 2 gastric ulcers, gastritis, mild duodenal stenosis. Started on Dexilant -EGD (04/2014, Dr. Benson Norway): 3 cm HH, healing antral ulcer, duodenal bulb deformity with nonobstructive stricture -EGD (02/2014, Dr. Benson Norway): 3 cm HH, multiple clean-based ulcers in the antrum, posterior duodenal bulb stenosis but traversable -EGD (03/2012, Dr. Benson Norway): Antral ulcers, post bulbar stricture, dilated with 15 mm TTS balloon with mucosal rent -EGD (03/2012, Dr. Benson Norway): Antral ulcers, post bulbar stricture which was not traversed, unsuccessful balloon dilation -Colonoscopy (07/2010, Dr. Benson Norway): Normal. Repeat 10 years    HPI:    Due to current restrictions/limitations of in-office visits due to the COVID-19 pandemic, this scheduled clinical appointment was converted to a telehealth virtual consultation using [employer].  Connectivity issues on the patient side, so this was converted to telephone call.  -Time of medical discussion: 24 minutes -The patient did consent to this virtual visit and is aware of possible charges through their insurance for this visit.  -Names of all parties present: Lindsay Frost (patient), Gerrit Heck, DO, [facility] (physician) -Patient location: Home -Physician location: Office  Lindsay Frost is a 64 y.o. female referred to the Gastroenterology Clinic for follow-up after recent EGD with Bravo placement.  EGD with 3 cm HH, Hill grade 3 valve, LA Grade A erosive esophagitis, along with post bulbar  stenosis and diverticulum in the duodenal bulb.  Bravo study (on  Dexilant) with significant esophageal acid exposure, with DeMeester score 24.5 despite appropriate PPI.  She continues to have daily HB. Continues to take prn antacids regularly for breakthrough sxs nearly every day.   Colonoscopy unfortunately with suboptimal prep.  Visualized mucosa was normal, with biopsies negative for MC.  Recommend repeat in 3 years.  Continues to endorse change in bowel habits, alternating between diarrhea/constipation. Does not consume much dietary fiber- no fruit, rare vegetables.  No hematochezia or melena.  Weight stable.  She thinks her initial significant weight loss was due to subconscious food avoidance due to exacerbating reflux symptoms.  Past medical history, past surgical history, social history, family history, medications, and allergies reviewed in the chart and with patient.    Past Medical History:  Diagnosis Date  . Arthritis   . Back pain    arthritis  . Depression    takes cymbalta daily  . Gallstones   . GERD (gastroesophageal reflux disease)    takes Omeprazole daily  . Headache(784.0)    migraines  . Heart murmur   . History of gastric ulcer   . History of migraine    last one about 2wks ago-takes Imitrex prn  . History of seizures   . Joint pain   . Joint swelling   . Meningioma ([place])   . Ulcer      Past Surgical History:  Procedure Laterality Date  . BALLOON DILATION  04/03/2012   Procedure: BALLOON DILATION;  Surgeon: Beryle Beams, MD;  Location: [facility];  Service: Endoscopy;  Laterality: N/A;  . BIOPSY  08/24/2019   Procedure: BIOPSY;  Surgeon: Lavena Bullion, DO;  Location: [facility];  Service: Gastroenterology;;  EGD and COLON  . BRAVO [place] STUDY N/A 08/24/2019   Procedure: BRAVO [place];  Surgeon: Lavena Bullion, DO;  [facility];  Service: Gastroenterology;  Laterality: N/A;  . CHOLECYSTECTOMY N/A 04/24/2018   Procedure:  LAPAROSCOPIC CHOLECYSTECTOMY;  Surgeon: Clovis Riley, MD;  Location: [facility];  Service: General;  Laterality: N/A;  . COLONOSCOPY    . COLONOSCOPY WITH PROPOFOL N/A 08/24/2019   Procedure: COLONOSCOPY WITH PROPOFOL;  Surgeon: Lavena Bullion, DO;  [facility];  Service: Gastroenterology;  Laterality: N/A;  . DIAGNOSTIC LAPAROSCOPY  43yrs ago  . ESOPHAGEAL MANOMETRY N/A 09/04/2015   Procedure: ESOPHAGEAL MANOMETRY (EM);  Surgeon: Carol Ada, MD;  [facility];  Service: Endoscopy;  Laterality: N/A;  . ESOPHAGOGASTRODUODENOSCOPY  04/03/2012   Procedure: ESOPHAGOGASTRODUODENOSCOPY (EGD);  Surgeon: Beryle Beams, MD;  Location: [facility];  Service: Endoscopy;  Laterality: N/A;  EGD w/ balloon dilation  . ESOPHAGOGASTRODUODENOSCOPY (EGD) WITH PROPOFOL N/A 02/18/2014   Procedure: ESOPHAGOGASTRODUODENOSCOPY (EGD) WITH PROPOFOL;  Surgeon: Beryle Beams, MD;  [facility];  Service: Endoscopy;  Laterality: N/A;  . ESOPHAGOGASTRODUODENOSCOPY (EGD) WITH PROPOFOL N/A 04/22/2014   Procedure: ESOPHAGOGASTRODUODENOSCOPY (EGD) WITH PROPOFOL;  Surgeon: Beryle Beams, MD;  [facility];  Service: Endoscopy;  Laterality: N/A;  . ESOPHAGOGASTRODUODENOSCOPY (EGD) WITH PROPOFOL N/A 08/24/2019   Procedure: ESOPHAGOGASTRODUODENOSCOPY (EGD) WITH PROPOFOL;  Surgeon: Lavena Bullion, DO;  [facility];  Service: Gastroenterology;  Laterality: N/A;  . NASAL SEPTUM SURGERY  09/2016  . [place] IMPEDANCE STUDY N/A 09/04/2015   Procedure: [place] IMPEDANCE STUDY;  Surgeon: Carol Ada, MD;  [facility];  Service: Endoscopy;  Laterality: N/A;  . REPLACEMENT TOTAL KNEE Right 49yrs ago  . TOTAL KNEE ARTHROPLASTY Left 11/30/2012   Procedure: LEFT TOTAL KNEE ARTHROPLASTY;  Surgeon:  Kerin Salen, MD;  Location: [facility];  Service: Orthopedics;  Laterality: Left;   Family History  Problem Relation Age of Onset  . Arthritis Mother   . Cancer Mother        breast  . Heart  disease Mother        died from "heart problems" at age 44, had CAD  . Depression Mother   . Heart disease Father        had CABG  . Kidney disease Father   . Arthritis Sister   . Heart murmur Sister   . Arthritis Brother        "serious back/neck problems"  . Arthritis Sister   . Aneurysm Sister   . Healthy Daughter   . Colon cancer Neg Hx   . Esophageal cancer Neg Hx    Social History   Tobacco Use  . Smoking status: Never Smoker  . Smokeless tobacco: Never Used  Substance Use Topics  . Alcohol use: No  . Drug use: No   Current Outpatient Medications  Medication Sig Dispense Refill  . amLODipine (NORVASC) 5 MG tablet Take 1 tablet (5 mg total) by mouth daily. 90 tablet 0  . Aspirin-Salicylamide-Caffeine (BC HEADACHE PO) Take 1 packet by mouth daily as needed (headaches).    . benzonatate (TESSALON) 100 MG capsule Take 1 capsule (100 mg total) by mouth 3 (three) times daily as needed for cough. 30 capsule 0  . cefdinir (OMNICEF) 300 MG capsule Take 1 capsule (300 mg total) by mouth 2 (two) times daily. 20 capsule 0  . cyclobenzaprine (FLEXERIL) 5 MG tablet Take 1 tablet (5 mg total) by mouth at bedtime as needed for muscle spasms. 15 tablet 0  . DEXILANT 60 MG capsule TAKE 1 CAPSULE(60 MG) BY MOUTH DAILY (Patient taking differently: Take 60 mg by mouth daily. ) 30 capsule 2  . eletriptan (RELPAX) 40 MG tablet TAKE 1 TABLET BY MOUTH AS NEEDED FOR MIGRAINE OF HEADACHE. MAY REPEAT IN 2 HOURS IF HEADACHE PERSISTS OR RECURS (Patient taking differently: Take 40 mg by mouth every 2 (two) hours as needed for migraine. ) 10 tablet 5  . fluticasone (FLONASE) 50 MCG/ACT nasal spray Place 2 sprays into both nostrils daily. (Patient taking differently: Place 2 sprays into both nostrils daily as needed. ) 16 g 1  . Lifitegrast (XIIDRA) 5 % SOLN Apply 1 drop to eye 2 (two) times daily.    Marland Kitchen topiramate (TOPAMAX) 100 MG tablet TAKE 1 TABLET(100 MG) BY MOUTH TWICE DAILY (Patient taking  differently: Take 100 mg by mouth 2 (two) times daily. ) 180 tablet 1   No current facility-administered medications for this visit.   Allergies  Allergen Reactions  . Naratriptan     Ineffective     Review of Systems: All systems reviewed and negative except where noted in HPI.     Physical Exam:    Complete physical exam not completed due to the nature of this telehealth communication.     ASSESSMENT AND PLAN;   1) GERD with Erosive Esophagitis 2) Hiatal hernia -Resume Dexilant and on-demand antacids -Continue antireflux lifestyle/dietary modifications -Referral to [facility] for consideration of lap hiatal hernia repair with either partial Nissen vs gastric bypass due to post bulbar stenosis -Check serum gastrin level.  Did discuss the potential for elevation due to PPI alone, but significantly elevated (>1000) should prompt further evaluation for gastrin producing tumor  3) Gastritis/Peptic ulcer disease -Biopsies with gastritis, negative for H. pylori but  did demonstrate intestinal metaplasia without dysplasia. -Discussed intestinal metaplasia finding at length today.  While this is a described premalignant entity, the risk of progression to malignancy is relatively unknown.  She is otherwise without high risk features (family history of stomach cancer, active smoking, persistent H. pylori, from high risk/endemic area, etc.). -Discussed repeat EGD with GIM mapping.  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  Given the post bulbar stenosis, do not feel that TIF would be the best option -Referral to Dr. Redmond Pulling at [facility]  6) Change in bowel  habits -Colonoscopy limited by suboptimal prep, but visualized mucosa was normal with biopsies negative for MC, IBD, etc. -Start fiber supplement  7) Weight loss -Weight has been largely stable since 07/2018, but baseline was low 200s as recently as 06/2016, losing 70-80# between then and 07/2018 unintentionally -CT abdomen/pelvis in 08/2018 otherwise unrevealing -Normal duodenal biopsies -Query weight loss due to post bulbar stenosis and reduced p.o. tolerance that she may not have necessarily recognized as a change  RTC in 6 months or sooner as needed   Lavena Bullion, DO, FACG  09/28/2019, 12:41 PM   Debbrah Alar, NP

## 2019-09-28 NOTE — Patient Instructions (Addendum)
Please start taking a daily fiber supplement such as citrucel, benefiber, metamucil, or fiber choice.   Return to office in 6 months  Your provider has requested that you go to the basement level for lab work at [facility] in [place] [address]. Press "B" on the elevator. The lab is located at the first door on the left as you exit the elevator.   You will be scheduled for an appointment with Redmond Pulling at [facility]. . Make certain to bring a list of current medications, including any over the counter medications or vitamins. Also bring your co-pay if you have one as well as your insurance cards. [facility] is located at [address]. Should you need to reschedule your appointment, please contact them at [phone].    It was a pleasure to see you today!  Lindsay Frost, D.O.

## 2019-10-01 ENCOUNTER — Other Ambulatory Visit: Payer: Self-pay | Admitting: Medical

## 2019-10-20 DIAGNOSIS — K449 Diaphragmatic hernia without obstruction or gangrene: Secondary | ICD-10-CM | POA: Diagnosis not present

## 2019-10-20 DIAGNOSIS — K259 Gastric ulcer, unspecified as acute or chronic, without hemorrhage or perforation: Secondary | ICD-10-CM | POA: Diagnosis not present

## 2019-10-20 DIAGNOSIS — K219 Gastro-esophageal reflux disease without esophagitis: Secondary | ICD-10-CM | POA: Diagnosis not present

## 2019-10-20 DIAGNOSIS — K315 Obstruction of duodenum: Secondary | ICD-10-CM | POA: Diagnosis not present

## 2019-10-21 ENCOUNTER — Other Ambulatory Visit: Payer: Self-pay | Admitting: General Surgery

## 2019-10-21 ENCOUNTER — Other Ambulatory Visit (HOSPITAL_COMMUNITY): Payer: Self-pay | Admitting: General Surgery

## 2019-10-21 DIAGNOSIS — K8050 Calculus of bile duct without cholangitis or cholecystitis without obstruction: Secondary | ICD-10-CM

## 2019-10-21 DIAGNOSIS — K449 Diaphragmatic hernia without obstruction or gangrene: Secondary | ICD-10-CM

## 2019-10-21 DIAGNOSIS — K219 Gastro-esophageal reflux disease without esophagitis: Secondary | ICD-10-CM

## 2019-10-26 ENCOUNTER — Telehealth: Payer: Self-pay

## 2019-10-26 DIAGNOSIS — K315 Obstruction of duodenum: Secondary | ICD-10-CM

## 2019-10-26 DIAGNOSIS — K449 Diaphragmatic hernia without obstruction or gangrene: Secondary | ICD-10-CM

## 2019-10-26 DIAGNOSIS — K2100 Gastro-esophageal reflux disease with esophagitis, without bleeding: Secondary | ICD-10-CM

## 2019-10-26 DIAGNOSIS — Z01818 Encounter for other preprocedural examination: Secondary | ICD-10-CM

## 2019-10-26 NOTE — Telephone Encounter (Signed)
Called and spoke with patient-patient informed of result note and MD recommendations; patient is agreeable with plan of care has been scheduled for her esophageal mano on 11/10/2019 at 10:30 am; COVID screening scheduled on 11/05/2019 at 10:00 am; instructions have been sent to the patient via mail and MyChart; Patient verbalized understanding of information/instructions;  Patient was advised to call the office at [phone] if questions/concerns arise;

## 2019-10-26 NOTE — Telephone Encounter (Signed)
-----   Message from [place], DO sent at 10/25/2019  4:51 PM EST ----- Regarding: Esophageal manometry Can you please set this patient up for esophageal manometry.  No need for pH/impedance testing.  Thank you. ----- Message ----- From: Greer Pickerel, MD Sent: 10/21/2019  10:32 AM EST To: Lavena Bullion, DO  Hi Vito,  I saw this lady yesterday. Very interesting case.   The duodenal stenosis/antral issue complicates the picture for me.   I'm getting an UGI to further define anatomy.   Do you think her duodenal stenosis is from chronic PUD and she is heading toward GOO?  I told her and her daughter I would also like to get esophageal manometry as well. Can you arrange that?    I think we need as much info as possible with this lady to come up with best surgical plan.   Not a candidate for gastric bypass.  Thanks Randall Hiss

## 2019-10-28 ENCOUNTER — Ambulatory Visit (HOSPITAL_COMMUNITY)
Admission: RE | Admit: 2019-10-28 | Discharge: 2019-10-28 | Disposition: A | Discharge status: Home / Self Care | Payer: Medicare Other | Source: Ambulatory Visit | Attending: General Surgery | Admitting: General Surgery

## 2019-10-28 ENCOUNTER — Other Ambulatory Visit: Payer: Self-pay

## 2019-10-28 ENCOUNTER — Encounter (HOSPITAL_COMMUNITY): Payer: Self-pay

## 2019-10-28 DIAGNOSIS — K219 Gastro-esophageal reflux disease without esophagitis: Secondary | ICD-10-CM

## 2019-11-01 NOTE — Telephone Encounter (Signed)
Pt requested to cancel hospital procedure scheduled with Dr. Silverio Decamp.

## 2019-11-01 NOTE — Telephone Encounter (Signed)
Called and spoke with patient- patient reports that Dr.Hung had sent her to complete this test some time in the past and she is not wanting to complete the test as it was "too painful for them to get that thing down my nose"; eso mano cancelled per patient request;

## 2019-11-02 NOTE — Telephone Encounter (Addendum)
Called and spoke with patient-patient informed of MD recommendations; patient is agreeable with plan of care and has requested that this information be sent to Dr. Pricilla Holm to [facility] per patient request; Patient verbalized understanding of information/instructions;  Patient was advised to call the office at [phone] if questions/concerns arise;

## 2019-11-02 NOTE — Telephone Encounter (Signed)
Ok. We will wait to see what her surgeon says, and possibly a barium esophagram will suffice to r/o esophageal motility disorder as part of her pre-op work up. Thanks.

## 2019-11-03 ENCOUNTER — Encounter (HOSPITAL_COMMUNITY): Payer: Self-pay

## 2019-11-03 ENCOUNTER — Encounter (HOSPITAL_COMMUNITY): Payer: Medicare Other

## 2019-11-05 ENCOUNTER — Other Ambulatory Visit (HOSPITAL_COMMUNITY): Payer: Medicare Other

## 2019-11-10 ENCOUNTER — Ambulatory Visit (HOSPITAL_COMMUNITY): Admission: RE | Admit: 2019-11-10 | Payer: Medicare Other | Source: Home / Self Care | Admitting: Gastroenterology

## 2019-11-10 ENCOUNTER — Encounter (HOSPITAL_COMMUNITY): Admission: RE | Payer: Self-pay | Source: Home / Self Care

## 2019-11-10 SURGERY — MANOMETRY, ESOPHAGUS
Anesthesia: Choice

## 2019-11-24 ENCOUNTER — Ambulatory Visit (INDEPENDENT_AMBULATORY_CARE_PROVIDER_SITE_OTHER): Payer: Medicare Other | Admitting: Internal Medicine

## 2019-11-24 ENCOUNTER — Encounter: Payer: Self-pay | Admitting: Internal Medicine

## 2019-11-24 ENCOUNTER — Inpatient Hospital Stay (HOSPITAL_COMMUNITY): Admission: RE | Admit: 2019-11-24 | Payer: Medicare Other | Source: Ambulatory Visit

## 2019-11-24 VITALS — Ht 64.0 in | Wt 135.0 lb

## 2019-11-24 DIAGNOSIS — J019 Acute sinusitis, unspecified: Secondary | ICD-10-CM | POA: Diagnosis not present

## 2019-11-24 MED ORDER — AZELASTINE HCL 0.1 % NA SOLN: 2.0000 | Freq: Two times a day (BID) | NASAL | 1 refills | Status: DC

## 2019-11-24 MED ORDER — AMOXICILLIN 875 MG PO TABS: 875.0000 mg | ORAL_TABLET | Freq: Two times a day (BID) | ORAL | 0 refills | Status: DC

## 2019-11-24 NOTE — Progress Notes (Signed)
Subjective:    Patient ID: Lindsay Frost, female    DOB: [birth year], 64 y.o.   MRN: [id number]  DOS:  11/24/2019 Type of visit - description: Virtual Visit via Telephone  Attempted  to make this a video visit, due to technical difficulties from the patient side it was not possible  thus we proceeded with a Virtual Visit via Telephone    I connected with above mentioned patient  by telephone and verified that I am speaking with the correct person using two identifiers.  THIS ENCOUNTER IS A VIRTUAL VISIT DUE TO COVID-19 - PATIENT WAS NOT SEEN IN THE OFFICE. PATIENT HAS CONSENTED TO VIRTUAL VISIT / TELEMEDICINE VISIT   Location of patient: home  Location of provider: office  I discussed the limitations, risks, security and privacy concerns of performing an evaluation and management service by telephone and the availability of in person appointments. I also discussed with the patient that there may be a patient responsible charge related to this service. The patient expressed understanding and agreed to proceed.  Acute Symptoms started 2 weeks ago, she thinks has a sinus infection: Sinus pressure, runny nose, occasional blood on the nasal discharge. She feels her ears are congested, slightly dizzy. Unable to sleep well due to cough which is mostly dry. Taking over-the-counter Flonase, Sudafed.     Review of Systems Has checked her temperature: No fever, no chills. Denies myalgias No chest pain no difficulty breathing Occasional nausea without vomiting or diarrhea  Past Medical History:  Diagnosis Date  . Arthritis   . Back pain    arthritis  . Depression    takes cymbalta daily  . Gallstones   . GERD (gastroesophageal reflux disease)    takes Omeprazole daily  . Headache(784.0)    migraines  . Heart murmur   . History of gastric ulcer   . History of migraine    last one about 2wks ago-takes Imitrex prn  . History of seizures   . Joint pain   . Joint swelling   .  Meningioma ([place])   . Ulcer     Past Surgical History:  Procedure Laterality Date  . BALLOON DILATION  04/03/2012   Procedure: BALLOON DILATION;  Surgeon: Beryle Beams, MD;  Location: [facility];  Service: Endoscopy;  Laterality: N/A;  . BIOPSY  08/24/2019   Procedure: BIOPSY;  Surgeon: Lavena Bullion, DO;  Location: [facility];  Service: Gastroenterology;;  EGD and COLON  . BRAVO [place] STUDY N/A 08/24/2019   Procedure: BRAVO [place];  Surgeon: Lavena Bullion, DO;  Location: [facility];  Service: Gastroenterology;  Laterality: N/A;  . CHOLECYSTECTOMY N/A 04/24/2018   Procedure: LAPAROSCOPIC CHOLECYSTECTOMY;  Surgeon: Clovis Riley, MD;  Location: [facility];  Service: General;  Laterality: N/A;  . COLONOSCOPY    . COLONOSCOPY WITH PROPOFOL N/A 08/24/2019   Procedure: COLONOSCOPY WITH PROPOFOL;  Surgeon: Lavena Bullion, DO;  Location: [facility];  Service: Gastroenterology;  Laterality: N/A;  . DIAGNOSTIC LAPAROSCOPY  47yrs ago  . ESOPHAGEAL MANOMETRY N/A 09/04/2015   Procedure: ESOPHAGEAL MANOMETRY (EM);  Surgeon: Carol Ada, MD;  Location: [facility];  Service: Endoscopy;  Laterality: N/A;  . ESOPHAGOGASTRODUODENOSCOPY  04/03/2012   Procedure: ESOPHAGOGASTRODUODENOSCOPY (EGD);  Surgeon: Beryle Beams, MD;  Location: [facility];  Service: Endoscopy;  Laterality: N/A;  EGD w/ balloon dilation  . ESOPHAGOGASTRODUODENOSCOPY (EGD) WITH PROPOFOL N/A 02/18/2014   Procedure: ESOPHAGOGASTRODUODENOSCOPY (EGD) WITH PROPOFOL;  Surgeon: Beryle Beams, MD;  Location:  [facility];  Service: Endoscopy;  Laterality: N/A;  . ESOPHAGOGASTRODUODENOSCOPY (EGD) WITH PROPOFOL N/A 04/22/2014   Procedure: ESOPHAGOGASTRODUODENOSCOPY (EGD) WITH PROPOFOL;  Surgeon: Beryle Beams, MD;  [facility];  Service: Endoscopy;  Laterality: N/A;  . ESOPHAGOGASTRODUODENOSCOPY (EGD) WITH PROPOFOL N/A 08/24/2019   Procedure: ESOPHAGOGASTRODUODENOSCOPY (EGD) WITH PROPOFOL;  Surgeon: Lavena Bullion,  DO;  Location: [facility];  Service: Gastroenterology;  Laterality: N/A;  . NASAL SEPTUM SURGERY  09/2016  . [place] IMPEDANCE STUDY N/A 09/04/2015   Procedure: [place] IMPEDANCE STUDY;  Surgeon: Carol Ada, MD;  Location: [facility];  Service: Endoscopy;  Laterality: N/A;  . REPLACEMENT TOTAL KNEE Right 38yrs ago  . TOTAL KNEE ARTHROPLASTY Left 11/30/2012   Procedure: LEFT TOTAL KNEE ARTHROPLASTY;  Surgeon: Kerin Salen, MD;  Location: [place];  Service: Orthopedics;  Laterality: Left;    Allergies as of 11/24/2019      Reactions   Naratriptan    Ineffective      Medication List       Accurate as of November 24, 2019 11:59 PM. If you have any questions, ask your nurse or doctor.        STOP taking these medications   benzonatate 100 MG capsule Commonly known as: TESSALON Stopped by: Kathlene November, MD   cefdinir 300 MG capsule Commonly known as: OMNICEF Stopped by: Kathlene November, MD     TAKE these medications   amLODipine 5 MG tablet Commonly known as: NORVASC Take 1 tablet (5 mg total) by mouth daily.   amoxicillin 875 MG tablet Commonly known as: AMOXIL Take 1 tablet (875 mg total) by mouth 2 (two) times daily. Started by: Kathlene November, MD   azelastine 0.1 % nasal spray Commonly known as: ASTELIN Place 2 sprays into both nostrils 2 (two) times daily. Started by: Kathlene November, MD   [facility] HEADACHE PO Take 1 packet by mouth daily as needed (headaches).   cyclobenzaprine 5 MG tablet Commonly known as: FLEXERIL Take 1 tablet (5 mg total) by mouth at bedtime as needed for muscle spasms.   Dexilant 60 MG capsule Generic drug: dexlansoprazole TAKE 1 CAPSULE(60 MG) BY MOUTH DAILY What changed: See the new instructions.   eletriptan 40 MG tablet Commonly known as: RELPAX TAKE 1 TABLET BY MOUTH AS NEEDED FOR MIGRAINE OF HEADACHE. MAY REPEAT IN 2 HOURS IF HEADACHE PERSISTS OR RECURS What changed: See the new instructions.   fluticasone 50 MCG/ACT nasal spray Commonly known as: FLONASE SHAKE  LIQUID AND USE 2 SPRAYS IN EACH NOSTRIL DAILY   topiramate 100 MG tablet Commonly known as: TOPAMAX TAKE 1 TABLET(100 MG) BY MOUTH TWICE DAILY What changed: See the new instructions.   Xiidra 5 % Soln Generic drug: Lifitegrast Apply 1 drop to eye 2 (two) times daily.          Objective:   Physical Exam Ht 5\' 4"  (1.626 m)   Wt 135 lb (61.2 kg)   BMI 23.17 kg/m  This is a telephone virtual visit, she is alert oriented x3.  Voice sounds slightly congested but no respiratory distress    Assessment     64 year old female, PMH includes laparoscopic cholecystectomy 04-2018, GERD, migraines, seizures, depression, Presents with: Sinusitis Symptoms are consistent with sinusitis, she knows the limitations of telephone assessment. Offered the Covid testing since she has respiratory symptoms, she declined. Plan: Amoxicillin, continue Flonase, start Astelin twice daily, stop Sudafed , rec  Mucinex DM. Good hydration. Call if not gradually better. We can check her  for COVID-19 at anytime if she changes her mind. She verbalized understanding of all of the above.     I discussed the assessment and treatment plan with the patient. The patient was provided an opportunity to ask questions and all were answered. The patient agreed with the plan and demonstrated an understanding of the instructions.   The patient was advised to call back or seek an in-person evaluation if the symptoms worsen or if the condition fails to improve as anticipated.  I provided 15 minutes of non-face-to-face time during this encounter.  Kathlene November, MD

## 2019-11-24 NOTE — Progress Notes (Signed)
Pre visit review using our clinic review tool, if applicable. No additional management support is needed unless otherwise documented below in the visit note. 

## 2019-11-26 ENCOUNTER — Other Ambulatory Visit: Payer: Self-pay | Admitting: Neurology

## 2019-11-30 ENCOUNTER — Ambulatory Visit (HOSPITAL_COMMUNITY)
Admission: RE | Admit: 2019-11-30 | Discharge: 2019-11-30 | Disposition: A | Discharge status: Home / Self Care | Payer: Medicare Other | Source: Ambulatory Visit | Attending: General Surgery | Admitting: General Surgery

## 2019-11-30 ENCOUNTER — Other Ambulatory Visit: Payer: Self-pay

## 2019-11-30 DIAGNOSIS — K219 Gastro-esophageal reflux disease without esophagitis: Secondary | ICD-10-CM | POA: Diagnosis not present

## 2019-11-30 DIAGNOSIS — K449 Diaphragmatic hernia without obstruction or gangrene: Secondary | ICD-10-CM | POA: Diagnosis not present

## 2019-12-20 ENCOUNTER — Other Ambulatory Visit: Payer: Self-pay | Admitting: Neurology

## 2019-12-24 ENCOUNTER — Other Ambulatory Visit: Payer: Medicare Other

## 2019-12-24 ENCOUNTER — Telehealth: Payer: Self-pay | Admitting: Gastroenterology

## 2019-12-24 DIAGNOSIS — R197 Diarrhea, unspecified: Secondary | ICD-10-CM

## 2019-12-24 NOTE — Telephone Encounter (Signed)
Patient notified New labs entered  She will come in today and pick up the stool specimens.

## 2019-12-24 NOTE — Telephone Encounter (Signed)
Left message for patient to call back  

## 2019-12-24 NOTE — Telephone Encounter (Signed)
Given recent antibiotic exposure, recommend the following: -Check C. difficile -If C. difficile negative, okay to resume OTC antidiarrheals -Start OTC probiotic -Ensure maintaining adequate hydration

## 2019-12-24 NOTE — Telephone Encounter (Signed)
Patient reports that she is having diarrhea 3-5 episodes of diarrhea.  She has tried OTC kaopectate and  imodium 4 tabs a day with no relief.  She completed a round of augmentin about 3 weeks ago for a sinus infection.  Please advise.

## 2019-12-27 NOTE — Telephone Encounter (Signed)
Pt requested to speak to RN regarding stool specimen.  She stated that she was instructed to drop off specimen today but was told that she is to pick it up.

## 2019-12-27 NOTE — Telephone Encounter (Signed)
Called and spoke with patient -patient's questions were answered regarding stool specimen being submitted to [facility] lab;  Patient advised to call back to the office at [phone] should questions/concerns arise;  Patient verbalized understanding of information/instructions;

## 2020-01-02 ENCOUNTER — Other Ambulatory Visit: Payer: Self-pay | Admitting: Medical

## 2020-02-04 ENCOUNTER — Ambulatory Visit (INDEPENDENT_AMBULATORY_CARE_PROVIDER_SITE_OTHER): Payer: Medicare Other | Admitting: Family Medicine

## 2020-02-04 ENCOUNTER — Other Ambulatory Visit: Payer: Self-pay

## 2020-02-04 ENCOUNTER — Encounter: Payer: Self-pay | Admitting: Family Medicine

## 2020-02-04 VITALS — BP 135/73 | HR 73 | Temp 98.2°F | Resp 18 | Ht 64.0 in | Wt 134.5 lb

## 2020-02-04 DIAGNOSIS — M549 Dorsalgia, unspecified: Secondary | ICD-10-CM | POA: Diagnosis not present

## 2020-02-04 DIAGNOSIS — R110 Nausea: Secondary | ICD-10-CM | POA: Diagnosis not present

## 2020-02-04 LAB — POC URINALSYSI DIPSTICK (AUTOMATED)
Bilirubin, UA: NEGATIVE
Blood, UA: NEGATIVE
Glucose, UA: NEGATIVE
Ketones, UA: NEGATIVE
Leukocytes, UA: NEGATIVE
Nitrite, UA: NEGATIVE
Protein, UA: POSITIVE — AB
Spec Grav, UA: >=1.03 — AB (ref 1.010–1.025)
Urobilinogen, UA: 0.2 E.U./dL
pH, UA: 6 (ref 5.0–8.0)

## 2020-02-04 MED ORDER — CYCLOBENZAPRINE HCL 5 MG PO TABS: 5.0000 mg | ORAL_TABLET | Freq: Every evening | ORAL | 0 refills | Status: DC | PRN

## 2020-02-04 MED ORDER — ONDANSETRON 4 MG PO TBDP: 4.0000 mg | ORAL_TABLET | Freq: Three times a day (TID) | ORAL | 0 refills | Status: DC | PRN

## 2020-02-04 MED ORDER — PREDNISONE 10 MG PO TABS: ORAL_TABLET | ORAL | 0 refills | Status: DC

## 2020-02-04 NOTE — Progress Notes (Signed)
Patient ID: Lindsay Frost, female    DOB: 09-19-1955  Age: 64 y.o. MRN: [id number]    Subjective:  Subjective  HPI Lindsay Frost presents for L flank pain x 3-4 days  ,   No known injury   Sitting for awhile or laying in same position makes it worse Heat makes it better.     Review of Systems  Constitutional: Negative for appetite change, diaphoresis, fatigue and unexpected weight change.  Eyes: Negative for pain, redness and visual disturbance.  Respiratory: Negative for cough, chest tightness, shortness of breath and wheezing.   Cardiovascular: Negative for chest pain, palpitations and leg swelling.  Endocrine: Negative for cold intolerance, heat intolerance, polydipsia, polyphagia and polyuria.  Genitourinary: Negative for difficulty urinating, dysuria and frequency.  Musculoskeletal: Positive for back pain. Negative for gait problem, joint swelling, myalgias, neck pain and neck stiffness.  Neurological: Negative for dizziness, light-headedness, numbness and headaches.    History Past Medical History:  Diagnosis Date  . Arthritis   . Back pain    arthritis  . Depression    takes cymbalta daily  . Gallstones   . GERD (gastroesophageal reflux disease)    takes Omeprazole daily  . Headache(784.0)    migraines  . Heart murmur   . History of gastric ulcer   . History of migraine    last one about 2wks ago-takes Imitrex prn  . History of seizures   . Joint pain   . Joint swelling   . Meningioma ([place])   . Ulcer     She has a past surgical history that includes Replacement total knee (Right, 30yrs ago); Esophagogastroduodenoscopy (04/03/2012); Balloon dilation (04/03/2012); Diagnostic laparoscopy (20yrs ago); Colonoscopy; Total knee arthroplasty (Left, 11/30/2012); Esophagogastroduodenoscopy (egd) with propofol (N/A, 02/18/2014); Esophagogastroduodenoscopy (egd) with propofol (N/A, 04/22/2014); Esophageal manometry (N/A, 09/04/2015); PH impedance study (N/A, 09/04/2015); Nasal septum  surgery (09/2016); Cholecystectomy (N/A, 04/24/2018); Esophagogastroduodenoscopy (egd) with propofol (N/A, 08/24/2019); Colonoscopy with propofol (N/A, 08/24/2019); biopsy (08/24/2019); and BRAVO ph study (N/A, 08/24/2019).   Her family history includes Aneurysm in her sister; Arthritis in her brother, mother, sister, and sister; Cancer in her mother; Depression in her mother; Healthy in her daughter; Heart disease in her father and mother; Heart murmur in her sister; Kidney disease in her father.She reports that she has never smoked. She has never used smokeless tobacco. She reports that she does not drink alcohol or use drugs.  Current Outpatient Medications on File Prior to Visit  Medication Sig Dispense Refill  . amLODipine (NORVASC) 5 MG tablet Take 1 tablet (5 mg total) by mouth daily. 90 tablet 0  . Aspirin-Salicylamide-Caffeine (BC HEADACHE PO) Take 1 packet by mouth daily as needed (headaches).    Marland Kitchen azelastine (ASTELIN) 0.1 % nasal spray Place 2 sprays into both nostrils 2 (two) times daily. 30 mL 1  . DEXILANT 60 MG capsule TAKE 1 CAPSULE(60 MG) BY MOUTH DAILY (Patient taking differently: Take 60 mg by mouth daily. ) 30 capsule 2  . eletriptan (RELPAX) 40 MG tablet TAKE 1 TABLET BY MOUTH AS NEEDED FOR MIGRAINE OR HEADACHE, MAY REPEAT IN 2 HOURS IF HEADACHE PERSISTS OR RECURS 10 tablet 5  . fluticasone (FLONASE) 50 MCG/ACT nasal spray SHAKE LIQUID AND USE 2 SPRAYS IN EACH NOSTRIL DAILY 48 g 0  . Lifitegrast (XIIDRA) 5 % SOLN Apply 1 drop to eye 2 (two) times daily.    Marland Kitchen topiramate (TOPAMAX) 100 MG tablet Take 1 tablet (100 mg total) by mouth 2 (two) times  daily. 60 tablet 3  . amoxicillin (AMOXIL) 875 MG tablet Take 1 tablet (875 mg total) by mouth 2 (two) times daily. (Patient not taking: Reported on 02/04/2020) 14 tablet 0   No current facility-administered medications on file prior to visit.     Objective:  Objective  Physical Exam BP 135/73 (BP Location: Left Arm, Patient Position:  Sitting, Cuff Size: Small)   Pulse 73   Temp 98.2 F (36.8 C) (Temporal)   Resp 18   Ht 5\' 4"  (1.626 m)   Wt 134 lb 8 oz (61 kg)   SpO2 100%   BMI 23.09 kg/m  Wt Readings from Last 3 Encounters:  02/04/20 134 lb 8 oz (61 kg)  11/24/19 135 lb (61.2 kg)  09/28/19 130 lb (59 kg)     Lab Results  Component Value Date   WBC 4.7 05/14/2019   HGB 12.4 05/14/2019   HCT 38.6 05/14/2019   PLT 331 05/14/2019   GLUCOSE 75 05/14/2019   CHOL 246 (H) 01/07/2017   TRIG 85.0 01/07/2017   HDL 55.30 01/07/2017   LDLCALC 173 (H) 01/07/2017   ALT 6 06/19/2018   AST 13 06/19/2018   NA 141 05/14/2019   K 3.7 05/14/2019   CL 110 05/14/2019   CREATININE 0.78 05/14/2019   BUN 21 05/14/2019   CO2 20 05/14/2019   TSH 1.20 06/19/2018   INR 0.98 11/24/2012   HGBA1C  02/19/2007    5.2 (NOTE)   The ADA recommends the following therapeutic goals for glycemic   control related to Hgb A1C measurement:   Goal of Therapy:   < 7.0% Hgb A1C   Action Suggested:  > 8.0% Hgb A1C   Ref:  Diabetes Care, 22, Suppl. 1, 1999    DG UGI W DOUBLE CM (HD BA)  Result Date: 11/30/2019 CLINICAL DATA:  64 year old female with history of hiatal hernia and gastroesophageal reflux disease (GERD). Under preoperative evaluation prior to potential Nissen fundoplication. Prior history of multiple gastric ulcers and duodenal ulcers. EXAM: UPPER GI SERIES WITH KUB TECHNIQUE: After obtaining a scout radiograph a routine upper GI series was performed using thin and high density barium. FLUOROSCOPY TIME:  Fluoroscopy Time:  3 minutes and 54 seconds Radiation Exposure Index (if provided by the fluoroscopic device): 27.7 mGy Number of Acquired Spot Images: 0 COMPARISON:  None. FINDINGS: Preprocedural KUB demonstrates a nonspecific, nonobstructive bowel gas pattern, with a nondilated loops of gas-filled small bowel in the low central abdomen, with gas and stool noted throughout the visualized portions of the colon. Surgical clips project  over the right upper quadrant of the abdomen, compatible with prior cholecystectomy. Initial double contrast images of the esophagus demonstrate a normal appearance of the esophageal mucosa. No esophageal mass, stricture or esophageal ring. Small hiatal hernia. Multiple single swallow attempts demonstrated intermittent failure to fully propagate a primary peristaltic wave. Double contrast images of the stomach demonstrate no definite gastric mass or ulcer. Duodenal bulb and proximal duodenum appeared slightly irregular and narrowed, which could suggest scarring from prior ulcer disease. IMPRESSION: 1. Mild irregularity and narrowing of the duodenal bulb and proximal duodenum suggesting scarring from prior ulcer disease. 2. Nonspecific esophageal motility disorder, as above. 3. Small hiatal hernia. Electronically Signed   By: Vinnie Langton M.D.   On: 11/30/2019 10:59     Assessment & Plan:  Plan  I am having Devoria Glassing. H[place]butt start on predniSONE and ondansetron. I am also having her maintain her Aspirin-Salicylamide-Caffeine (BC HEADACHE PO), Dexilant,  Xiidra, amLODipine, amoxicillin, azelastine, topiramate, eletriptan, fluticasone, and cyclobenzaprine.  Meds ordered this encounter  Medications  . cyclobenzaprine (FLEXERIL) 5 MG tablet    Sig: Take 1 tablet (5 mg total) by mouth at bedtime as needed for muscle spasms.    Dispense:  15 tablet    Refill:  0  . predniSONE (DELTASONE) 10 MG tablet    Sig: TAKE 3 TABLETS PO QD FOR 3 DAYS THEN TAKE 2 TABLETS PO QD FOR 3 DAYS THEN TAKE 1 TABLET PO QD FOR 3 DAYS THEN TAKE 1/2 TAB PO QD FOR 3 DAYS    Dispense:  20 tablet    Refill:  0  . ondansetron (ZOFRAN ODT) 4 MG disintegrating tablet    Sig: Take 1 tablet (4 mg total) by mouth every 8 (eight) hours as needed for nausea or vomiting.    Dispense:  20 tablet    Refill:  0    Problem List Items Addressed This Visit    None    Visit Diagnoses    Back pain, unspecified back location, unspecified  back pain laterality, unspecified chronicity    -  Primary   Relevant Medications   cyclobenzaprine (FLEXERIL) 5 MG tablet   predniSONE (DELTASONE) 10 MG tablet   Other Relevant Orders   POCT Urinalysis Dipstick (Automated) (Completed)   Nausea       Relevant Medications   ondansetron (ZOFRAN ODT) 4 MG disintegrating tablet      Follow-up: Return in about 2 weeks (around 02/18/2020), or if symptoms worsen or fail to improve.  Ann Held, DO

## 2020-02-04 NOTE — Patient Instructions (Signed)
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
## 2020-02-09 ENCOUNTER — Other Ambulatory Visit: Payer: Self-pay

## 2020-02-09 ENCOUNTER — Telehealth (INDEPENDENT_AMBULATORY_CARE_PROVIDER_SITE_OTHER): Payer: Medicare Other | Admitting: Neurology

## 2020-02-09 ENCOUNTER — Encounter: Payer: Self-pay | Admitting: Neurology

## 2020-02-09 DIAGNOSIS — R569 Unspecified convulsions: Secondary | ICD-10-CM | POA: Diagnosis not present

## 2020-02-09 DIAGNOSIS — G43009 Migraine without aura, not intractable, without status migrainosus: Secondary | ICD-10-CM | POA: Diagnosis not present

## 2020-02-25 ENCOUNTER — Other Ambulatory Visit: Payer: Self-pay | Admitting: Neurology

## 2020-03-22 ENCOUNTER — Telehealth: Payer: Self-pay | Admitting: *Deleted

## 2020-03-22 MED ORDER — AMLODIPINE BESYLATE 5 MG PO TABS: 5.0000 mg | ORAL_TABLET | Freq: Every day | ORAL | 0 refills | Status: DC

## 2020-03-22 NOTE — Telephone Encounter (Signed)
Please contact pt to schedule a follow up visit.  

## 2020-03-22 NOTE — Telephone Encounter (Signed)
Lindsay Frost -- pt has had a few acute visits this past year. When will she be due for routine follow up?  Received request from [facility] for amlodipine refill. 90 day supply sent.

## 2020-04-01 ENCOUNTER — Other Ambulatory Visit: Payer: Self-pay | Admitting: Family

## 2020-04-10 DIAGNOSIS — M545 Low back pain: Secondary | ICD-10-CM | POA: Diagnosis not present

## 2020-04-11 ENCOUNTER — Other Ambulatory Visit: Payer: Self-pay

## 2020-04-11 ENCOUNTER — Encounter: Payer: Self-pay | Admitting: Family

## 2020-04-11 ENCOUNTER — Ambulatory Visit (INDEPENDENT_AMBULATORY_CARE_PROVIDER_SITE_OTHER): Payer: Medicare Other | Admitting: Family

## 2020-04-11 VITALS — BP 130/83 | HR 81 | Temp 98.6°F | Resp 16 | Ht 64.0 in | Wt 138.2 lb

## 2020-04-11 DIAGNOSIS — R5383 Other fatigue: Secondary | ICD-10-CM

## 2020-04-11 DIAGNOSIS — E538 Deficiency of other specified B group vitamins: Secondary | ICD-10-CM | POA: Diagnosis not present

## 2020-04-11 DIAGNOSIS — I10 Essential (primary) hypertension: Secondary | ICD-10-CM

## 2020-04-11 DIAGNOSIS — H6980 Other specified disorders of Eustachian tube, unspecified ear: Secondary | ICD-10-CM

## 2020-04-11 DIAGNOSIS — Z0184 Encounter for antibody response examination: Secondary | ICD-10-CM

## 2020-04-11 DIAGNOSIS — J309 Allergic rhinitis, unspecified: Secondary | ICD-10-CM

## 2020-04-11 DIAGNOSIS — K219 Gastro-esophageal reflux disease without esophagitis: Secondary | ICD-10-CM

## 2020-04-11 DIAGNOSIS — Z8719 Personal history of other diseases of the digestive system: Secondary | ICD-10-CM | POA: Diagnosis not present

## 2020-04-11 DIAGNOSIS — Z8711 Personal history of peptic ulcer disease: Secondary | ICD-10-CM

## 2020-04-11 MED ORDER — DEXILANT 60 MG PO CPDR: DELAYED_RELEASE_CAPSULE | ORAL | 1 refills | Status: DC

## 2020-04-11 NOTE — Progress Notes (Signed)
Subjective:    Patient ID: Lindsay Frost, female    DOB: 13-Mar-1956, 64 y.o.   MRN: [id number]  HPI  Patient is a 63 yr old female who presents today for follow up.   Notes fullness in both ears today when she woke up. Some nasal congestion and sneezing. She has been using flonase and claritin.  HTN- bp meds include amlodipine 5mg . BP Readings from Last 3 Encounters:  04/11/20 (!) 130/83  02/04/20 135/73  08/24/19 (!) 153/64   GERD- continues dexilant.  Reports that reflux is "horrible." She ran out of dexilant. Ran out about 2 months ago.  Was better on medication.     Migraines- continues to follow with neurology.  Reports stable.   Allergic Rhinitis- uses flonase prn.   Notes + fatigue.     Review of Systems See HPI  Past Medical History:  Diagnosis Date  . Arthritis   . Back pain    arthritis  . Depression    takes cymbalta daily  . Gallstones   . GERD (gastroesophageal reflux disease)    takes Omeprazole daily  . Headache(784.0)    migraines  . Heart murmur   . History of gastric ulcer   . History of migraine    last one about 2wks ago-takes Imitrex prn  . History of seizures   . Joint pain   . Joint swelling   . Meningioma ([place])   . Ulcer      Social History   Socioeconomic History  . Marital status: Widowed    Spouse name: Not on file  . Number of children: 1  . Years of education: Not on file  . Highest education level: Some college, no degree  Occupational History  . Occupation: Retired  Tobacco Use  . Smoking status: Never Smoker  . Smokeless tobacco: Never Used  Vaping Use  . Vaping Use: Never used  Substance and Sexual Activity  . Alcohol use: No  . Drug use: No  . Sexual activity: Not Currently    Birth control/protection: Post-menopausal  Other Topics Concern  . 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, mining   Single   1 daughter- lives in [place]   2 dogs    Enjoys reading, watching television   Pt is right handed   Drinks coffee once a week, green tea daily, soda sometimes    Social Determinants of Health   Financial Resource Strain:   . Difficulty of Paying Living Expenses:   Food Insecurity:   . Worried About Charity fundraiser in the Last Year:   . Arboriculturist in the Last Year:   Transportation Needs:   . Film/video editor (Medical):   Marland Kitchen Lack of Transportation (Non-Medical):   Physical Activity:   . Days of Exercise per Week:   . Minutes of Exercise per Session:   Stress:   . Feeling of Stress :   Social Connections:   . Frequency of Communication with Friends and Family:   . Frequency of Social Gatherings with Friends and Family:   . Attends Religious Services:   . Active Member of Clubs or Organizations:   . Attends Archivist Meetings:   Marland Kitchen Marital Status:   Intimate Partner Violence:   . Fear of Current or Ex-Partner:   . Emotionally Abused:   Marland Kitchen Physically Abused:   . Sexually Abused:  Past Surgical History:  Procedure Laterality Date  . BALLOON DILATION  04/03/2012   Procedure: BALLOON DILATION;  Surgeon: Beryle Beams, MD;  Location: [facility];  Service: Endoscopy;  Laterality: N/A;  . BIOPSY  08/24/2019   Procedure: BIOPSY;  Surgeon: Lavena Bullion, DO;  Location: [facility];  Service: Gastroenterology;;  EGD and COLON  . BRAVO [place] STUDY N/A 08/24/2019   Procedure: BRAVO [place];  Surgeon: Lavena Bullion, DO;  Location: [facility];  Service: Gastroenterology;  Laterality: N/A;  . CHOLECYSTECTOMY N/A 04/24/2018   Procedure: LAPAROSCOPIC CHOLECYSTECTOMY;  Surgeon: Clovis Riley, MD;  Location: [facility];  Service: General;  Laterality: N/A;  . COLONOSCOPY    . COLONOSCOPY WITH PROPOFOL N/A 08/24/2019   Procedure: COLONOSCOPY WITH PROPOFOL;  Surgeon: Lavena Bullion, DO;  Location: [facility];  Service: Gastroenterology;  Laterality: N/A;  . DIAGNOSTIC LAPAROSCOPY  65yrs ago  .  ESOPHAGEAL MANOMETRY N/A 09/04/2015   Procedure: ESOPHAGEAL MANOMETRY (EM);  Surgeon: Carol Ada, MD;  Location: [facility];  Service: Endoscopy;  Laterality: N/A;  . ESOPHAGOGASTRODUODENOSCOPY  04/03/2012   Procedure: ESOPHAGOGASTRODUODENOSCOPY (EGD);  Surgeon: Beryle Beams, MD;  Location: [facility];  Service: Endoscopy;  Laterality: N/A;  EGD w/ balloon dilation  . ESOPHAGOGASTRODUODENOSCOPY (EGD) WITH PROPOFOL N/A 02/18/2014   Procedure: ESOPHAGOGASTRODUODENOSCOPY (EGD) WITH PROPOFOL;  Surgeon: Beryle Beams, MD;  Location: [facility];  Service: Endoscopy;  Laterality: N/A;  . ESOPHAGOGASTRODUODENOSCOPY (EGD) WITH PROPOFOL N/A 04/22/2014   Procedure: ESOPHAGOGASTRODUODENOSCOPY (EGD) WITH PROPOFOL;  Surgeon: Beryle Beams, MD;  Location: [facility];  Service: Endoscopy;  Laterality: N/A;  . ESOPHAGOGASTRODUODENOSCOPY (EGD) WITH PROPOFOL N/A 08/24/2019   Procedure: ESOPHAGOGASTRODUODENOSCOPY (EGD) WITH PROPOFOL;  Surgeon: Lavena Bullion, DO;  Location[facility];  Service: Gastroenterology;  Laterality: N/A;  . NASAL SEPTUM SURGERY  09/2016  . [place] IMPEDANCE STUDY N/A 09/04/2015   Procedure: [place] IMPEDANCE STUDY;  Surgeon: Carol Ada, MD;  Location: [facility];  Service: Endoscopy;  Laterality: N/A;  . REPLACEMENT TOTAL KNEE Right 11yrs ago  . TOTAL KNEE ARTHROPLASTY Left 11/30/2012   Procedure: LEFT TOTAL KNEE ARTHROPLASTY;  Surgeon: Kerin Salen, MD;  Location: [facility];  Service: Orthopedics;  Laterality: Left;    Family History  Problem Relation Age of Onset  . Arthritis Mother   . Cancer Mother        breast  . Heart disease Mother        died from "heart problems" at age 34, had CAD  . Depression Mother   . Heart disease Father        had CABG  . Kidney disease Father   . Arthritis Sister   . Heart murmur Sister   . Arthritis Brother        "serious back/neck problems"  . Arthritis Sister   . Aneurysm Sister   . Healthy Daughter   . Colon cancer Neg Hx   .  Esophageal cancer Neg Hx     Allergies  Allergen Reactions  . Naratriptan     Ineffective    Current Outpatient Medications on File Prior to Visit  Medication Sig Dispense Refill  . amLODipine (NORVASC) 5 MG tablet Take 1 tablet (5 mg total) by mouth daily. 90 tablet 0  . Aspirin-Salicylamide-Caffeine (BC HEADACHE PO) Take 1 packet by mouth daily as needed (headaches).    Marland Kitchen azelastine (ASTELIN) 0.1 % nasal spray Place 2 sprays into both nostrils 2 (two) times daily. 30 mL 1  .  eletriptan (RELPAX) 40 MG tablet TAKE 1 TABLET BY MOUTH AS NEEDED FOR MIGRAINE OR HEADACHE, MAY REPEAT IN 2 HOURS IF HEADACHE PERSISTS OR RECURS 10 tablet 5  . fluticasone (FLONASE) 50 MCG/ACT nasal spray SHAKE LIQUID AND USE 2 SPRAYS IN EACH NOSTRIL DAILY 48 g 0  . Lifitegrast (XIIDRA) 5 % SOLN Apply 1 drop to eye 2 (two) times daily.    . ondansetron (ZOFRAN ODT) 4 MG disintegrating tablet Take 1 tablet (4 mg total) by mouth every 8 (eight) hours as needed for nausea or vomiting. 20 tablet 0  . topiramate (TOPAMAX) 100 MG tablet TAKE 1 TABLET(100 MG) BY MOUTH TWICE DAILY 180 tablet 1   No current facility-administered medications on file prior to visit.    BP (!) 130/83 (BP Location: Right Arm, Patient Position: Sitting, Cuff Size: Small)   Pulse 81   Temp 98.6 F (37 C) (Oral)   Resp 16   Ht 5\' 4"  (1.626 m)   Wt 138 lb 3.2 oz (62.7 kg)   SpO2 100%   BMI 23.72 kg/m       Objective:   Physical Exam Constitutional:      Appearance: She is well-developed.  HENT:     Right Ear: Tympanic membrane and ear canal normal.     Left Ear: Ear canal normal.     Ears:     Comments: Spot of blood noted on left TM Cardiovascular:     Rate and Rhythm: Normal rate and regular rhythm.     Heart sounds: Normal heart sounds. No murmur heard.   Pulmonary:     Effort: Pulmonary effort is normal. No respiratory distress.     Breath sounds: Normal breath sounds. No wheezing.  Psychiatric:        Behavior: Behavior  normal.        Thought Content: Thought content normal.        Judgment: Judgment normal.           Assessment & Plan:  Allergic Rhinitis/Eustachian tube dysfunction- She has been using qtips and I think caused some damage to her L TM, though it is not perforated. Advised her to d/c use of q-tips.  Continue claritin, flonase, add prn mucinex. Follow up if symptoms worsen or fail to improve.  HTN- bp stable on amlodipine. Continue same.   GERD- uncontrolled. Restart PPI.  She is requesting covid IgG- counseled pt on the importance of covid vaccination.  B12 deficiency- check follow up level. She is taking an otc oral supplement.  Fatigue- check TSH, cbc.  This visit occurred during the SARS-CoV-2 public health emergency.  Safety protocols were in place, including screening questions prior to the visit, additional usage of staff PPE, and extensive cleaning of exam room while observing appropriate contact time as indicated for disinfecting solutions.

## 2020-04-11 NOTE — Patient Instructions (Signed)
Please complete lab work prior to leaving. You can try adding mucinex 600 mg twice daily as needed for congestion.  Continue flonase and claritin. Call if congest/ear symptoms worsen or if symptoms fail to improve.

## 2020-04-12 LAB — CBC WITH DIFFERENTIAL/PLATELET
Basophils Absolute: 0.1 10*3/uL (ref 0.0–0.1)
Basophils Relative: 1.2 % (ref 0.0–3.0)
Eosinophils Absolute: 0.2 10*3/uL (ref 0.0–0.7)
Eosinophils Relative: 3.9 % (ref 0.0–5.0)
HCT: 39.7 % (ref 36.0–46.0)
Hemoglobin: 13.1 g/dL (ref 12.0–15.0)
Lymphocytes Relative: 39.9 % (ref 12.0–46.0)
Lymphs Abs: 2.1 10*3/uL (ref 0.7–4.0)
MCHC: 33.1 g/dL (ref 30.0–36.0)
MCV: 91.7 fl (ref 78.0–100.0)
Monocytes Absolute: 0.5 10*3/uL (ref 0.1–1.0)
Monocytes Relative: 9.5 % (ref 3.0–12.0)
Neutro Abs: 2.4 10*3/uL (ref 1.4–7.7)
Neutrophils Relative %: 45.5 % (ref 43.0–77.0)
Platelets: 348 10*3/uL (ref 150.0–400.0)
RBC: 4.33 Mil/uL (ref 3.87–5.11)
RDW: 14.1 % (ref 11.5–15.5)
WBC: 5.2 10*3/uL (ref 4.0–10.5)

## 2020-04-12 LAB — BASIC METABOLIC PANEL
BUN: 18 mg/dL (ref 6–23)
CO2: 25 mEq/L (ref 19–32)
Calcium: 9 mg/dL (ref 8.4–10.5)
Chloride: 110 mEq/L (ref 96–112)
Creatinine, Ser: 0.78 mg/dL (ref 0.40–1.20)
GFR: 74.4 mL/min (ref >60.00)
Glucose, Bld: 79 mg/dL (ref 70–99)
Potassium: 3.9 mEq/L (ref 3.5–5.1)
Sodium: 141 mEq/L (ref 135–145)

## 2020-04-12 LAB — VITAMIN B12: Vitamin B-12: 126 pg/mL — ABNORMAL LOW (ref 211–911)

## 2020-04-12 LAB — SARS-COV-2 IGG: SARS-COV-2 IgG: 0.03

## 2020-04-12 LAB — TSH: TSH: 1.81 u[IU]/mL (ref 0.35–4.50)

## 2020-04-14 ENCOUNTER — Telehealth: Payer: Self-pay | Admitting: Family

## 2020-04-14 DIAGNOSIS — M546 Pain in thoracic spine: Secondary | ICD-10-CM | POA: Diagnosis not present

## 2020-04-14 NOTE — Telephone Encounter (Signed)
Results given to patient, she verbalized understanding and was scheduled for B12 1 of 4 weekly on 04-19-2020

## 2020-04-14 NOTE — Telephone Encounter (Signed)
b12 is low. I would recommend that she start b12 injections, 1053mcg IM weekly x 4 weeks then monthly.  She does not have antibodies to covid.  I recommend that she get vaccinated to protect herself.

## 2020-04-17 ENCOUNTER — Encounter: Payer: Self-pay | Admitting: Family Medicine

## 2020-04-17 ENCOUNTER — Telehealth: Payer: Medicare Other | Admitting: Family Medicine

## 2020-04-17 NOTE — Progress Notes (Signed)
Patient contacted for telephone via number listed [phone], however did not answer.  VM left.  Will try again. Grier Mitts, MD

## 2020-04-17 NOTE — Progress Notes (Signed)
No answer on 2nd attempt to contact pt.  Grier Mitts, MD

## 2020-04-19 ENCOUNTER — Ambulatory Visit (INDEPENDENT_AMBULATORY_CARE_PROVIDER_SITE_OTHER): Payer: Medicare Other

## 2020-04-19 ENCOUNTER — Other Ambulatory Visit: Payer: Self-pay

## 2020-04-19 ENCOUNTER — Encounter: Payer: Self-pay | Admitting: Family

## 2020-04-19 DIAGNOSIS — E538 Deficiency of other specified B group vitamins: Secondary | ICD-10-CM

## 2020-04-19 MED ORDER — CYANOCOBALAMIN 1000 MCG/ML IJ SOLN
1000.0000 ug | Freq: Once | INTRAMUSCULAR | Status: AC
  Administered 2020-04-19: 1000 ug via INTRAMUSCULAR

## 2020-04-19 NOTE — Progress Notes (Signed)
Pt here for weekly B12 injection per Debbrah Alar , NP  B12 1072mcg given IM , right deltoid , and pt tolerated injection well.  Next B12 injection scheduled for next week.

## 2020-04-22 MED ORDER — CYANOCOBALAMIN 1000 MCG/ML IJ SOLN
INTRAMUSCULAR | 0 refills | Status: DC
Start: 2020-04-22 — End: 2021-05-30

## 2020-04-22 MED ORDER — "SYRINGE 23G X 1"" 3 ML MISC": 0 refills | Status: DC

## 2020-04-22 NOTE — Telephone Encounter (Signed)
I sent an rx to her pharmacy for b12 and syringes. Let's have her keep her next nurse visit for teaching then she can do on her own.

## 2020-04-24 ENCOUNTER — Encounter: Payer: Self-pay | Admitting: Family

## 2020-04-24 DIAGNOSIS — M47816 Spondylosis without myelopathy or radiculopathy, lumbar region: Secondary | ICD-10-CM | POA: Diagnosis not present

## 2020-04-25 ENCOUNTER — Encounter: Payer: Self-pay | Admitting: Family Medicine

## 2020-04-25 ENCOUNTER — Telehealth (INDEPENDENT_AMBULATORY_CARE_PROVIDER_SITE_OTHER): Payer: Medicare Other | Admitting: Family Medicine

## 2020-04-25 DIAGNOSIS — H9209 Otalgia, unspecified ear: Secondary | ICD-10-CM

## 2020-04-25 DIAGNOSIS — R0981 Nasal congestion: Secondary | ICD-10-CM | POA: Diagnosis not present

## 2020-04-25 MED ORDER — AMOXICILLIN-POT CLAVULANATE 875-125 MG PO TABS: 1.0000 | ORAL_TABLET | Freq: Two times a day (BID) | ORAL | 0 refills | Status: DC

## 2020-04-25 NOTE — Progress Notes (Signed)
Virtual Visit via Video Note  I connected with Lindsay Frost  on 04/25/20 at 11:40 AM EDT by a video enabled telemedicine application and verified that I am speaking with the correct person using two identifiers.  Location patient: home Location provider:work or home office Persons participating in the virtual visit: patient, provider  I discussed the limitations of evaluation and management by telemedicine and the availability of in person appointments. The patient expressed understanding and agreed to proceed.   HPI:  Acute visit for sinus congestion: -started about 10 days ago, now worsening -reports a history of " a lot of sinus infections" -symptoms include nasal congestion - now thicker and bloody today, sinus discomfort, ear feel full, L ear pain, PND - feels like sinuses are draining at night -reports did see PCP for this 1 week ago and reports her ear was a little red, but thought claritin would help, instead has gotten worse -denies vomiting, diarrhea, fevers, CP, SOB, malaise, worst ha -denies any known sick contacts -she is not vaccinated for COVID19  ROS: See pertinent positives and negatives per HPI.  Past Medical History:  Diagnosis Date   Arthritis    Back pain    arthritis   Depression    takes cymbalta daily   Gallstones    GERD (gastroesophageal reflux disease)    takes Omeprazole daily   Headache(784.0)    migraines   Heart murmur    History of gastric ulcer    History of migraine    last one about 2wks ago-takes Imitrex prn   History of seizures    Joint pain    Joint swelling    Meningioma ([place])    Ulcer     Past Surgical History:  Procedure Laterality Date   BALLOON DILATION  04/03/2012   Procedure: BALLOON DILATION;  Surgeon: Beryle Beams, MD;  [facility];  Service: Endoscopy;  Laterality: N/A;   BIOPSY  08/24/2019   Procedure: BIOPSY;  Surgeon: Lavena Bullion, DO;  [facility];  Service: Gastroenterology;;   EGD and COLON   BRAVO [place] STUDY N/A 08/24/2019   Procedure: BRAVO [place];  Surgeon: Lavena Bullion, DO;  [facility];  Service: Gastroenterology;  Laterality: N/A;   CHOLECYSTECTOMY N/A 04/24/2018   Procedure: LAPAROSCOPIC CHOLECYSTECTOMY;  Surgeon: Clovis Riley, MD;  [facility];  Service: General;  Laterality: N/A;   COLONOSCOPY     COLONOSCOPY WITH PROPOFOL N/A 08/24/2019   Procedure: COLONOSCOPY WITH PROPOFOL;  Surgeon: Lavena Bullion, DO;  [facility];  Service: Gastroenterology;  Laterality: N/A;   DIAGNOSTIC LAPAROSCOPY  19yrs ago   ESOPHAGEAL MANOMETRY N/A 09/04/2015   Procedure: ESOPHAGEAL MANOMETRY (EM);  Surgeon: Carol Ada, MD;  [facility];  Service: Endoscopy;  Laterality: N/A;   ESOPHAGOGASTRODUODENOSCOPY  04/03/2012   Procedure: ESOPHAGOGASTRODUODENOSCOPY (EGD);  Surgeon: Beryle Beams, MD;  Location: [facility];  Service: Endoscopy;  Laterality: N/A;  EGD w/ balloon dilation   ESOPHAGOGASTRODUODENOSCOPY (EGD) WITH PROPOFOL N/A 02/18/2014   Procedure: ESOPHAGOGASTRODUODENOSCOPY (EGD) WITH PROPOFOL;  Surgeon: Beryle Beams, MD;  [facility];  Service: Endoscopy;  Laterality: N/A;   ESOPHAGOGASTRODUODENOSCOPY (EGD) WITH PROPOFOL N/A 04/22/2014   Procedure: ESOPHAGOGASTRODUODENOSCOPY (EGD) WITH PROPOFOL;  Surgeon: Beryle Beams, MD;  [facility];  Service: Endoscopy;  Laterality: N/A;   ESOPHAGOGASTRODUODENOSCOPY (EGD) WITH PROPOFOL N/A 08/24/2019   Procedure: ESOPHAGOGASTRODUODENOSCOPY (EGD) WITH PROPOFOL;  Surgeon: Lavena Bullion, DO;  [facility];  Service: Gastroenterology;  Laterality:  N/A;   NASAL SEPTUM SURGERY  09/2016   [place] IMPEDANCE STUDY N/A 09/04/2015   Procedure: [place] IMPEDANCE STUDY;  Surgeon: Carol Ada, MD;  [facility];  Service: Endoscopy;  Laterality: N/A;   REPLACEMENT TOTAL KNEE Right 58yrs ago   TOTAL KNEE ARTHROPLASTY Left 11/30/2012   Procedure: LEFT  TOTAL KNEE ARTHROPLASTY;  Surgeon: Kerin Salen, MD;  Location: [place];  Service: Orthopedics;  Laterality: Left;    Family History  Problem Relation Age of Onset   Arthritis Mother    Cancer Mother        breast   Heart disease Mother        died from "heart problems" at age 47, had CAD   Depression Mother    Heart disease Father        had CABG   Kidney disease Father    Arthritis Sister    Heart murmur Sister    Arthritis Brother        "serious back/neck problems"   Arthritis Sister    Aneurysm Sister    Healthy Daughter    Colon cancer Neg Hx    Esophageal cancer Neg Hx     SOCIAL HX: see hpi   Current Outpatient Medications:    amLODipine (NORVASC) 5 MG tablet, Take 1 tablet (5 mg total) by mouth daily., Disp: 90 tablet, Rfl: 0   Aspirin-Salicylamide-Caffeine (BC HEADACHE PO), Take 1 packet by mouth daily as needed (headaches)., Disp: , Rfl:    azelastine (ASTELIN) 0.1 % nasal spray, Place 2 sprays into both nostrils 2 (two) times daily., Disp: 30 mL, Rfl: 1   cyanocobalamin (,VITAMIN B-12,) 1000 MCG/ML injection, 1049mcg SQ once weekly for 3 more weeks, then once monthly., Disp: 15 mL, Rfl: 0   dexlansoprazole (DEXILANT) 60 MG capsule, TAKE 1 CAPSULE(60 MG) BY MOUTH DAILY, Disp: 90 capsule, Rfl: 1   eletriptan (RELPAX) 40 MG tablet, TAKE 1 TABLET BY MOUTH AS NEEDED FOR MIGRAINE OR HEADACHE, MAY REPEAT IN 2 HOURS IF HEADACHE PERSISTS OR RECURS, Disp: 10 tablet, Rfl: 5   fluticasone (FLONASE) 50 MCG/ACT nasal spray, SHAKE LIQUID AND USE 2 SPRAYS IN EACH NOSTRIL DAILY, Disp: 48 g, Rfl: 0   Lifitegrast (XIIDRA) 5 % SOLN, Apply 1 drop to eye 2 (two) times daily., Disp: , Rfl:    ondansetron (ZOFRAN ODT) 4 MG disintegrating tablet, Take 1 tablet (4 mg total) by mouth every 8 (eight) hours as needed for nausea or vomiting., Disp: 20 tablet, Rfl: 0   Syringe/Needle, Disp, (SYRINGE 3CC/23GX1") 23G X 1" 3 ML MISC, Use as instructed, Disp: 15 each, Rfl: 0    topiramate (TOPAMAX) 100 MG tablet, TAKE 1 TABLET(100 MG) BY MOUTH TWICE DAILY, Disp: 180 tablet, Rfl: 1   amoxicillin-clavulanate (AUGMENTIN) 875-125 MG tablet, Take 1 tablet by mouth 2 (two) times daily., Disp: 20 tablet, Rfl: 0  EXAM:  VITALS per patient if applicable:  GENERAL: alert, oriented, appears well and in no acute distress  HEENT: atraumatic, conjunttiva clear, no obvious abnormalities on inspection of external nose and ears  NECK: normal movements of the head and neck  LUNGS: on inspection no signs of respiratory distress, breathing rate appears normal, no obvious gross SOB, gasping or wheezing  CV: no obvious cyanosis  MS: moves all visible extremities without noticeable abnormality  PSYCH/NEURO: pleasant and cooperative, no obvious depression or anxiety, speech and thought processing grossly intact  ASSESSMENT AND PLAN:  Discussed the following assessment and plan:  Sinus congestion  Otalgia, unspecified laterality  -we discussed possible serious and likely etiologies, options for evaluation and workup, limitations of telemedicine visit vs in person visit, treatment, treatment risks and precautions. Pt prefers to treat via telemedicine empirically rather then risking or undertaking an in person visit at this moment. Query sinusitis and/or OM 2ndary to viral or allergic sinus congestion vs other. Also discussed possibility of COVID19 - she feels is unlikely as she reports she remains isolated. She opted for treatment with Augmentin 875 bid x 10 days, nasal saline, short course of nasal decongestant. Agrees to staying home while sick.  Discussed testing options, timing may cloud results.  Patient agrees to seek prompt in person care if worsening, new symptoms arise, or if is not improving with treatment.   I discussed the assessment and treatment plan with the patient. The patient was provided an opportunity to ask questions and all were answered. The patient agreed with  the plan and demonstrated an understanding of the instructions.   The patient was advised to call back or seek an in-person evaluation if the symptoms worsen or if the condition fails to improve as anticipated.   Lucretia Kern, DO

## 2020-04-25 NOTE — Patient Instructions (Signed)
-  I sent the medication(s) we discussed to your pharmacy: Meds ordered this encounter  Medications  . amoxicillin-clavulanate (AUGMENTIN) 875-125 MG tablet    Sig: Take 1 tablet by mouth 2 (two) times daily.    Dispense:  20 tablet    Refill:  0    Please let us know if you have any questions or concerns regarding this prescription.  I hope you are feeling better soon! Seek care promptly if your symptoms worsen, new concerns arise or you are not improving with treatment.  

## 2020-04-26 ENCOUNTER — Ambulatory Visit (INDEPENDENT_AMBULATORY_CARE_PROVIDER_SITE_OTHER): Payer: Medicare Other

## 2020-04-26 ENCOUNTER — Other Ambulatory Visit: Payer: Self-pay

## 2020-04-26 DIAGNOSIS — E538 Deficiency of other specified B group vitamins: Secondary | ICD-10-CM | POA: Diagnosis not present

## 2020-04-26 MED ORDER — CYANOCOBALAMIN 1000 MCG/ML IJ SOLN
1000.0000 ug | Freq: Once | INTRAMUSCULAR | Status: AC
  Administered 2020-04-26: 1000 ug via INTRAMUSCULAR

## 2020-04-26 NOTE — Progress Notes (Addendum)
Pt here for weekly B12 injection per Debbrah Alar , NP  B12 1063mcg given IM,Left Deltoid  and pt tolerated injection well.  Next B12 injection scheduled for no appointment scheduled, Melissa approved for patient to get injections at home.  Reviewed.  Debbrah Alar NP

## 2020-04-27 ENCOUNTER — Telehealth: Payer: Self-pay | Admitting: *Deleted

## 2020-04-27 NOTE — Telephone Encounter (Signed)
error 

## 2020-05-02 ENCOUNTER — Other Ambulatory Visit: Payer: Self-pay | Admitting: Family

## 2020-05-02 ENCOUNTER — Telehealth: Payer: Self-pay | Admitting: Family

## 2020-05-02 NOTE — Progress Notes (Signed)
Do you mind calling [facility] and confirming that they received the b12 vials I sent on 8/7 and notify patient.

## 2020-05-03 ENCOUNTER — Telehealth: Payer: Self-pay

## 2020-05-03 ENCOUNTER — Other Ambulatory Visit: Payer: Self-pay | Admitting: Neurology

## 2020-05-03 NOTE — Progress Notes (Signed)
Patient can not afford medication. PA was initiated

## 2020-05-03 NOTE — Telephone Encounter (Signed)
Patient came in for first b12 and teaching. Per pharmacy PA is needed. PA was initiated

## 2020-05-03 NOTE — Progress Notes (Signed)
Patient came in for first dose, she can not afford medication. PA was initiated.

## 2020-05-06 NOTE — Telephone Encounter (Signed)
Opened in error

## 2020-05-19 NOTE — Telephone Encounter (Signed)
No notes

## 2020-06-03 ENCOUNTER — Other Ambulatory Visit: Payer: Self-pay | Admitting: Neurology

## 2020-06-22 ENCOUNTER — Other Ambulatory Visit: Payer: Self-pay

## 2020-06-22 MED ORDER — AMLODIPINE BESYLATE 5 MG PO TABS: 5.0000 mg | ORAL_TABLET | Freq: Every day | ORAL | 0 refills | Status: DC

## 2020-07-02 ENCOUNTER — Other Ambulatory Visit: Payer: Self-pay | Admitting: Family

## 2020-07-04 ENCOUNTER — Encounter: Payer: Self-pay | Admitting: Family

## 2020-07-04 ENCOUNTER — Ambulatory Visit (INDEPENDENT_AMBULATORY_CARE_PROVIDER_SITE_OTHER): Payer: Medicare Other | Admitting: Family

## 2020-07-04 ENCOUNTER — Other Ambulatory Visit: Payer: Self-pay

## 2020-07-04 VITALS — BP 132/64 | HR 75 | Temp 98.4°F | Resp 16 | Ht 64.0 in | Wt 136.2 lb

## 2020-07-04 DIAGNOSIS — R0789 Other chest pain: Secondary | ICD-10-CM | POA: Diagnosis not present

## 2020-07-04 DIAGNOSIS — E538 Deficiency of other specified B group vitamins: Secondary | ICD-10-CM

## 2020-07-04 DIAGNOSIS — I10 Essential (primary) hypertension: Secondary | ICD-10-CM

## 2020-07-04 DIAGNOSIS — Z23 Encounter for immunization: Secondary | ICD-10-CM

## 2020-07-04 DIAGNOSIS — Z0000 Encounter for general adult medical examination without abnormal findings: Secondary | ICD-10-CM | POA: Diagnosis not present

## 2020-07-04 DIAGNOSIS — E348 Other specified endocrine disorders: Secondary | ICD-10-CM

## 2020-07-04 MED ORDER — AZITHROMYCIN 250 MG PO TABS
ORAL_TABLET | ORAL | 0 refills | Status: DC
Start: 2020-07-04 — End: 2020-07-25

## 2020-07-04 MED ORDER — CYANOCOBALAMIN 1000 MCG/ML IJ SOLN
1000.0000 ug | Freq: Once | INTRAMUSCULAR | Status: AC
  Administered 2020-07-04: 1000 ug via INTRAMUSCULAR

## 2020-07-04 NOTE — Patient Instructions (Signed)
Please start azithromycin (antibiotic) for sinus infection. Continue flonase and zyrtec.  You should be contacted about scheduling your appointment with the cardiologist. Complete lab work prior to leaving.

## 2020-07-04 NOTE — Progress Notes (Signed)
Subjective:    Patient ID: Lindsay Frost, female    DOB: Jun 12, 1956, 64 y.o.   MRN: [id number]  HPI  Patient is a 64 yr old female who presents today for cpx.    Immunizations: declines covid vaccine, declines shingrix Diet: healthy Exercise: minimal, just ADL's Colonoscopy: 2020 Dexa:due  Pap Smear:declines Mammogram: due  Also reports  Reports that 05/04/20 she had a virtual visit and was treated with augmentin for sinus infection.  Had some improvement but it wasn't long "before it came back." feels like the ocean is in her head. Has "constant popping noises in both ears." Increased difficulty hearing. Also feels dizzy.    Reports some elevated bp readings at home 150/109. Improves with laying down. She has previous hx of sinus surgery.   b12 deficiency-  Due today- would like to continue here rather than doing her own at home.     Reports gerd pain but requests cardiology consult due to her hypertension and GERD pain- "I can't tell what is what" pain.   Review of Systems  Constitutional: Negative for unexpected weight change.  HENT: Positive for hearing loss and postnasal drip.   Eyes: Negative for visual disturbance.  Respiratory: Positive for cough.   Cardiovascular: Positive for chest pain (see HPI, worse with exertion).  Gastrointestinal: Positive for constipation (intermittent). Negative for blood in stool.  Genitourinary: Negative for dysuria and hematuria.  Musculoskeletal: Positive for arthralgias.       C/o cramping in the arches of her feet which will sometimes wake.    Neurological: Positive for headaches.  Hematological: Negative for adenopathy.  Psychiatric/Behavioral:       Reports some mild depression because she has not been able to do the things she enjoys due to Lindsay Frost   Past Medical History:  Diagnosis Date  . Arthritis   . Back pain    arthritis  . Depression    takes cymbalta daily  . Gallstones   . GERD (gastroesophageal reflux disease)     takes Omeprazole daily  . Headache(784.0)    migraines  . Heart murmur   . History of gastric ulcer   . History of migraine    last one about 2wks ago-takes Imitrex prn  . History of seizures   . Joint pain   . Joint swelling   . Meningioma ([place])   . Ulcer      Social History   Socioeconomic History  . Marital status: Widowed    Spouse name: Not on file  . Number of children: 1  . Years of education: Not on file  . Highest education level: Some college, no degree  Occupational History  . Occupation: Retired  Tobacco Use  . Smoking status: Never Smoker  . Smokeless tobacco: Never Used  Vaping Use  . Vaping Use: Never used  Substance and Sexual Activity  . Alcohol use: No  . Drug use: No  . Sexual activity: Not Currently    Birth control/protection: Post-menopausal  Other Topics Concern  . 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 Difficulty of Paying Living Expenses: Not on file  Food Insecurity:   . Worried About Charity fundraiser in the Last Year: Not on file  . Ran Out of Food in the Last Year: Not on file  Transportation Needs:   . Lack of Transportation (Medical): Not on file  . Lack of Transportation (Non-Medical): Not on file  Physical Activity:   . Days of Exercise per Week: Not on file  . Minutes of Exercise per Session: Not on file  Stress:   . Feeling of Stress : Not on file  Social Connections:   . Frequency of Communication with Friends and Family: Not on file  . Frequency of Social Gatherings with Friends and Family: Not on file  . Attends Religious Services: Not on file  . Active Member of Clubs or Organizations: Not on file  . Attends English as a second language teacher Meetings: Not on file  . Marital Status: Not on file  Intimate Partner Violence:   . Fear of Current or Ex-Partner: Not on file  . Emotionally Abused: Not on file  . Physically Abused: Not on file  . Sexually Abused: Not on file    Past Surgical History:  Procedure Laterality Date  . BALLOON DILATION  04/03/2012   Procedure: BALLOON DILATION;  Surgeon: Beryle Beams, MD;  [facility];  Service: Endoscopy;  Laterality: N/A;  . BIOPSY  08/24/2019   Procedure: BIOPSY;  Surgeon: Lavena Bullion, DO;  [facility];  Service: Gastroenterology;;  EGD and COLON  . BRAVO [place] STUDY N/A 08/24/2019   Procedure: BRAVO [place];  Surgeon: Lavena Bullion, DO;  [facility];  Service: Gastroenterology;  Laterality: N/A;  . CHOLECYSTECTOMY N/A 04/24/2018   Procedure: LAPAROSCOPIC CHOLECYSTECTOMY;  Surgeon: Clovis Riley, MD;  [facility];  Service: General;  Laterality: N/A;  . COLONOSCOPY    . COLONOSCOPY WITH PROPOFOL N/A 08/24/2019   Procedure: COLONOSCOPY WITH PROPOFOL;  Surgeon: Lavena Bullion, DO;  [facility];  Service: Gastroenterology;  Laterality: N/A;  . DIAGNOSTIC LAPAROSCOPY  59yrs ago  . ESOPHAGEAL MANOMETRY N/A 09/04/2015   Procedure: ESOPHAGEAL MANOMETRY (EM);  Surgeon: Carol Ada, MD;  [facility];  Service: Endoscopy;  Laterality: N/A;  . ESOPHAGOGASTRODUODENOSCOPY  04/03/2012   Procedure: ESOPHAGOGASTRODUODENOSCOPY (EGD);  Surgeon: Beryle Beams, MD;  [facility];  Service: Endoscopy;  Laterality: N/A;  EGD w/ balloon dilation  . ESOPHAGOGASTRODUODENOSCOPY (EGD) WITH PROPOFOL N/A 02/18/2014   Procedure: ESOPHAGOGASTRODUODENOSCOPY (EGD) WITH PROPOFOL;  Surgeon: Beryle Beams, MD;  [facility];  Service: Endoscopy;  Laterality: N/A;  . ESOPHAGOGASTRODUODENOSCOPY (EGD) WITH PROPOFOL N/A 04/22/2014   Procedure: ESOPHAGOGASTRODUODENOSCOPY (EGD) WITH PROPOFOL;  Surgeon: Beryle Beams, MD;   [facility];  Service: Endoscopy;  Laterality: N/A;  . ESOPHAGOGASTRODUODENOSCOPY (EGD) WITH PROPOFOL N/A 08/24/2019   Procedure: ESOPHAGOGASTRODUODENOSCOPY (EGD) WITH PROPOFOL;  Surgeon: Lavena Bullion, DO;  [facility];  Service: Gastroenterology;  Laterality: N/A;  . NASAL SEPTUM SURGERY  09/2016  . [place] IMPEDANCE STUDY N/A 09/04/2015   Procedure: [place] IMPEDANCE STUDY;  Surgeon: Carol Ada, MD;  [facility];  Service: Endoscopy;  Laterality: N/A;  . REPLACEMENT TOTAL KNEE Right 20yrs ago  . TOTAL KNEE ARTHROPLASTY Left 11/30/2012   Procedure: LEFT TOTAL KNEE ARTHROPLASTY;  Surgeon: Kerin Salen, MD;  Location: [place];  Service: Orthopedics;  Laterality: Left;    Family History  Problem Relation Age of Onset  . Arthritis Mother   . Cancer Mother  breast  . Heart disease Mother        died from "heart problems" at age 80, had CAD  . Depression Mother   . Heart disease Father        had CABG  . Kidney disease Father   . Arthritis Sister   . Heart murmur Sister   . Arthritis Brother        "serious back/neck problems"  . Arthritis Sister   . Aneurysm Sister   . Healthy Daughter   . Colon cancer Neg Hx   . Esophageal cancer Neg Hx     Allergies  Allergen Reactions  . Naratriptan     Ineffective    Current Outpatient Medications on File Prior to Visit  Medication Sig Dispense Refill  . amLODipine (NORVASC) 5 MG tablet Take 1 tablet (5 mg total) by mouth daily. 30 tablet 0  . Aspirin-Salicylamide-Caffeine (BC HEADACHE PO) Take 1 packet by mouth daily as needed (headaches).    [place] azelastine (ASTELIN) 0.1 % nasal spray Place 2 sprays into both nostrils 2 (two) times daily. 30 mL 1  . cyanocobalamin (,VITAMIN B-12,) 1000 MCG/ML injection 1050mcg SQ once weekly for 3 more weeks, then once monthly. 15 mL 0  . dexlansoprazole (DEXILANT) 60 MG capsule TAKE 1 CAPSULE(60 MG) BY MOUTH DAILY 90 capsule 1  . eletriptan (RELPAX) 40 MG tablet TAKE 1  TABLET BY MOUTH AS NEEDED FOR MIGRAINE OR HEADACHE, MAY REPEAT IN 2 HOURS IF HEADACHE PERSISTS OR RECURS 10 tablet 5  . fluticasone (FLONASE) 50 MCG/ACT nasal spray SHAKE LIQUID AND USE 2 SPRAYS IN EACH NOSTRIL DAILY 48 g 0  . Lifitegrast (XIIDRA) 5 % SOLN Apply 1 drop to eye 2 (two) times daily.    . ondansetron (ZOFRAN ODT) 4 MG disintegrating tablet Take 1 tablet (4 mg total) by mouth every 8 (eight) hours as needed for nausea or vomiting. 20 tablet 0  . Syringe/Needle, Disp, (SYRINGE 3CC/23GX1") 23G X 1" 3 ML MISC Use as instructed 15 each 0  . topiramate (TOPAMAX) 100 MG tablet TAKE 1 TABLET(100 MG) BY MOUTH TWICE DAILY 180 tablet 1   No current facility-administered medications on file prior to visit.    BP 132/64 (BP Location: Left Arm, Patient Position: Sitting, Cuff Size: Small)   Pulse 75   Temp 98.4 F (36.9 C) (Oral)   Resp 16   Ht 5\' 4"  (1.626 m)   Wt 136 lb 3.2 oz (61.8 kg)   SpO2 99%   BMI 23.38 kg/m       Objective:   Physical Exam  Physical Exam  Constitutional: She is oriented to person, place, and time. She appears well-developed and well-nourished. No distress.  HENT:  Head: Normocephalic and atraumatic.  Right Ear: Tympanic membrane and ear canal normal.  Left Ear: Tympanic membrane and ear canal normal.  Mouth/Throat: Not examined- pt wearing mask Eyes: Pupils are equal, round, and reactive to light. No scleral icterus.  Neck: Normal range of motion. No thyromegaly present.  Cardiovascular: Normal rate and regular rhythm.   No murmur heard. Pulmonary/Chest: Effort normal and breath sounds normal. No respiratory distress. He has no wheezes. She has no rales. She exhibits no tenderness.  Abdominal: Soft. Bowel sounds are normal. She exhibits no distension and no mass. There is no tenderness. There is no rebound and no guarding.  Musculoskeletal: She exhibits no edema.  Lymphadenopathy:    She has no cervical adenopathy.  Neurological: She is alert and  oriented to  person, place, and time. She has normal patellar reflexes. She exhibits normal muscle tone. Coordination normal.  Skin: Skin is warm and dry.  Psychiatric: She has a normal mood and affect. Her behavior is normal. Judgment and thought content normal.  Breasts: Examined lying Right: Without masses, retractions, discharge or axillary adenopathy.  Left: Without masses, retractions, discharge or axillary adenopathy.  Pelvic: deferred           Assessment & Plan:   Preventative care- refer for mammo.  Flu shot today. Has not received covid vaccine and we discussed the importance of vaccination in preventing death/serious illness from covid-19. Declines pap. Refer for dexa. Colo up to date.   Sinusitis- will rx with azithromycin.   Atypical chest pain- will refer to cardiology for further evaluation. EKG performed today in the office notes NSR without any acute changes.   HTN- bp looks ok today. Has had some higher readings at home. Will continue amlodipine 5mg  once daily.  b12 deficiency- B12 injection today.  This visit occurred during the SARS-CoV-2 public health emergency.  Safety protocols were in place, including screening questions prior to the visit, additional usage of staff PPE, and extensive cleaning of exam room while observing appropriate contact time as indicated for disinfecting solutions.            Assessment & Plan:

## 2020-07-05 LAB — COMPREHENSIVE METABOLIC PANEL
AG Ratio: 2 (calc) (ref 1.0–2.5)
ALT: 8 U/L (ref 6–29)
AST: 15 U/L (ref 10–35)
Albumin: 4.5 g/dL (ref 3.6–5.1)
Alkaline phosphatase (APISO): 112 U/L (ref 37–153)
BUN: 14 mg/dL (ref 7–25)
CO2: 24 mmol/L (ref 20–32)
Calcium: 9.2 mg/dL (ref 8.6–10.4)
Chloride: 112 mmol/L — ABNORMAL HIGH (ref 98–110)
Creat: 0.99 mg/dL (ref 0.50–0.99)
Globulin: 2.3 g/dL (calc) (ref 1.9–3.7)
Glucose, Bld: 84 mg/dL (ref 65–99)
Potassium: 3.8 mmol/L (ref 3.5–5.3)
Sodium: 142 mmol/L (ref 135–146)
Total Bilirubin: 0.3 mg/dL (ref 0.2–1.2)
Total Protein: 6.8 g/dL (ref 6.1–8.1)

## 2020-07-06 ENCOUNTER — Encounter: Payer: Self-pay | Admitting: Family

## 2020-07-07 MED ORDER — METHOCARBAMOL 500 MG PO TABS: 500.0000 mg | ORAL_TABLET | Freq: Every evening | ORAL | 1 refills | Status: DC | PRN

## 2020-07-11 ENCOUNTER — Ambulatory Visit (HOSPITAL_BASED_OUTPATIENT_CLINIC_OR_DEPARTMENT_OTHER)
Admission: RE | Admit: 2020-07-11 | Discharge: 2020-07-11 | Disposition: A | Discharge status: Home / Self Care | Payer: Medicare Other | Source: Ambulatory Visit | Attending: Family | Admitting: Family

## 2020-07-11 ENCOUNTER — Other Ambulatory Visit: Payer: Self-pay

## 2020-07-11 DIAGNOSIS — Z13820 Encounter for screening for osteoporosis: Secondary | ICD-10-CM | POA: Diagnosis not present

## 2020-07-11 DIAGNOSIS — E348 Other specified endocrine disorders: Secondary | ICD-10-CM | POA: Diagnosis not present

## 2020-07-11 DIAGNOSIS — Z8739 Personal history of other diseases of the musculoskeletal system and connective tissue: Secondary | ICD-10-CM | POA: Diagnosis not present

## 2020-07-11 DIAGNOSIS — Z780 Asymptomatic menopausal state: Secondary | ICD-10-CM | POA: Diagnosis not present

## 2020-07-12 ENCOUNTER — Telehealth: Payer: Self-pay | Admitting: Family

## 2020-07-12 MED ORDER — ALENDRONATE SODIUM 70 MG PO TABS: 70.0000 mg | ORAL_TABLET | ORAL | 11 refills | Status: DC

## 2020-07-12 NOTE — Telephone Encounter (Signed)
Please let pt know that her bone density shows that her bones are very brittle. I would like for her to start fosamax once weekly in the AM. She should not lay down for 90 minutes after taking fosamax.

## 2020-07-12 NOTE — Telephone Encounter (Signed)
OK for her to start with her medical history. Most important thing is that she not lay down for 90 minutes after dose as this can cause irritation of the esophagus.

## 2020-07-12 NOTE — Telephone Encounter (Signed)
Patient advised of results, she reports she was reading about the medication and it indicates not to take if gi problems.  She has a history of GERD and peptic ulcers. Will like to make sure this is ok to take

## 2020-07-13 NOTE — Telephone Encounter (Signed)
Patient notified and she stated that she try.

## 2020-07-21 DIAGNOSIS — Z87898 Personal history of other specified conditions: Secondary | ICD-10-CM | POA: Insufficient documentation

## 2020-07-21 DIAGNOSIS — Z8669 Personal history of other diseases of the nervous system and sense organs: Secondary | ICD-10-CM | POA: Insufficient documentation

## 2020-07-21 DIAGNOSIS — M2540 Effusion, unspecified joint: Secondary | ICD-10-CM | POA: Insufficient documentation

## 2020-07-21 DIAGNOSIS — M1990 Unspecified osteoarthritis, unspecified site: Secondary | ICD-10-CM | POA: Insufficient documentation

## 2020-07-21 DIAGNOSIS — K8020 Calculus of gallbladder without cholecystitis without obstruction: Secondary | ICD-10-CM | POA: Insufficient documentation

## 2020-07-21 DIAGNOSIS — M549 Dorsalgia, unspecified: Secondary | ICD-10-CM | POA: Insufficient documentation

## 2020-07-21 DIAGNOSIS — Z8711 Personal history of peptic ulcer disease: Secondary | ICD-10-CM | POA: Insufficient documentation

## 2020-07-21 DIAGNOSIS — R011 Cardiac murmur, unspecified: Secondary | ICD-10-CM | POA: Insufficient documentation

## 2020-07-21 DIAGNOSIS — M2550 Pain in unspecified joint: Secondary | ICD-10-CM | POA: Insufficient documentation

## 2020-07-21 DIAGNOSIS — D329 Benign neoplasm of meninges, unspecified: Secondary | ICD-10-CM | POA: Insufficient documentation

## 2020-07-24 ENCOUNTER — Ambulatory Visit: Payer: Medicare Other | Admitting: Cardiology

## 2020-07-25 ENCOUNTER — Other Ambulatory Visit: Payer: Self-pay

## 2020-07-25 ENCOUNTER — Encounter: Payer: Self-pay | Admitting: Cardiology

## 2020-07-25 ENCOUNTER — Ambulatory Visit (INDEPENDENT_AMBULATORY_CARE_PROVIDER_SITE_OTHER): Payer: Medicare Other | Admitting: Cardiology

## 2020-07-25 VITALS — BP 134/82 | HR 70 | Ht 62.0 in | Wt 143.4 lb

## 2020-07-25 DIAGNOSIS — I10 Essential (primary) hypertension: Secondary | ICD-10-CM | POA: Diagnosis not present

## 2020-07-25 DIAGNOSIS — I209 Angina pectoris, unspecified: Secondary | ICD-10-CM

## 2020-07-25 DIAGNOSIS — E782 Mixed hyperlipidemia: Secondary | ICD-10-CM

## 2020-07-25 HISTORY — DX: Mixed hyperlipidemia: E78.2

## 2020-07-25 HISTORY — DX: Angina pectoris, unspecified: I20.9

## 2020-07-25 MED ORDER — NITROGLYCERIN 0.4 MG SL SUBL: 0.4000 mg | SUBLINGUAL_TABLET | SUBLINGUAL | 6 refills | Status: DC | PRN

## 2020-07-25 MED ORDER — ASPIRIN EC 81 MG PO TBEC: 81.0000 mg | DELAYED_RELEASE_TABLET | Freq: Every day | ORAL | 3 refills | Status: DC

## 2020-07-25 NOTE — Progress Notes (Signed)
Cardiology Office Note:    Date:  07/25/2020   ID:  Lindsay Frost, DOB April 11, 1956, MRN [id number]  PCP:  Debbrah Alar, NP  Cardiologist:  Jenean Lindau, MD   Referring MD: Debbrah Alar, NP    ASSESSMENT:    1. Essential hypertension   2. Angina pectoris ([place])   3. Mixed dyslipidemia    PLAN:    In order of problems listed above:  Angina pectoris: Patient symptoms are very concerning.  In view of this I have asked her to take a coated baby aspirin on a daily basis 81 mg.  Sublingual nitroglycerin prescription was sent, its protocol and 911 protocol explained and the patient vocalized understanding questions were answered to the patient's satisfaction.In view of the patient's symptoms, I discussed with the patient options for evaluation. Invasive and noninvasive options were given to the patient. I discussed stress testing and coronary angiography and left heart catheterization at length. Benefits, pros and cons of each approach were discussed at length. Patient had multiple questions which were answered to the patient's satisfaction. Patient opted for invasive evaluation and we will set up for coronary angiography and left heart catheterization. Further recommendations will be made based on the findings with coronary angiography. In the interim if the patient has any significant symptoms in hospital to the nearest emergency room.  I also gave her an option of CT coronary angiography with FFR but she prefers conventional coronary angiography. Essential hypertension: Blood pressure stable lifestyle modification was stressed Mixed dyslipidemia: Her lipids are markedly elevated and we would like to check them in the next few days to modify risk for coronary artery disease.  I discussed this with her.  Patient had multiple questions which were answered to her satisfaction.  Medication Adjustments/Labs and Tests Ordered: Current medicines are reviewed at length with the patient  today.  Concerns regarding medicines are outlined above.  No orders of the defined types were placed in this encounter.  No orders of the defined types were placed in this encounter.    History of Present Illness:    Lindsay Frost is a 64 y.o. female who is being seen today for the evaluation of chest pain at the request of Debbrah Alar, NP.  Patient is a pleasant 64 year old female with past medical history of essential hypertension and dyslipidemia.  Her lipids were elevated in 2018 and LDL was 173.  She mentions to me that consistently in the past several weeks she has noticed chest tightness on exertion this happens when she goes out and tries to walk when she settles and gets rest and feels better.  No radiation to the neck or to the arms.  The symptoms have concerned her.  She also has history of acid reflux and is treated for this.  She mentions to me that the reflux symptoms generally occur at night.  At the time of my evaluation, the patient is alert awake oriented and in no distress.  Past Medical History:  Diagnosis Date  . Allergic rhinitis 07/16/2016  . Arthritis   . Back pain    arthritis  . Depression    takes cymbalta daily  . Duodenal stenosis   . Epigastric pain 07/29/2018  . Essential hypertension 05/14/2019  . Gallstones   . GERD (gastroesophageal reflux disease)    takes Omeprazole daily  . Headache(784.0)    migraines  . Heart murmur   . Hiatal hernia   . History of gastric ulcer   . History  of migraine    last one about 2wks ago-takes Imitrex prn  . History of seizures   . Joint pain   . Joint swelling   . 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 Meningioma ([place])   . Migraine without aura and without status migrainosus, not intractable 01/13/2015  . Nocturnal leg cramps 02/20/2015  . Obesity (BMI 30-39.9) 07/22/2014  . Osteoarthritis of left knee 12/02/2012  . Peptic ulcer disease   . Physical exam  07/22/2014  . Seizure-like activity ([place]) 05/12/2014  . Ulcer     Past Surgical History:  Procedure Laterality Date  . BALLOON DILATION  04/03/2012   Procedure: BALLOON DILATION;  Surgeon: Beryle Beams, MD;  Location: [facility];  Service: Endoscopy;  Laterality: N/A;  . BIOPSY  08/24/2019   Procedure: BIOPSY;  Surgeon: Lavena Bullion, DO;  Location: [facility];  Service: Gastroenterology;;  EGD and COLON  . BRAVO [place] STUDY N/A 08/24/2019   Procedure: BRAVO [place];  Surgeon: Lavena Bullion, DO;  Location: [facility];  Service: Gastroenterology;  Laterality: N/A;  . CHOLECYSTECTOMY N/A 04/24/2018   Procedure: LAPAROSCOPIC CHOLECYSTECTOMY;  Surgeon: Clovis Riley, MD;  Location: [facility];  Service: General;  Laterality: N/A;  . COLONOSCOPY    . COLONOSCOPY WITH PROPOFOL N/A 08/24/2019   Procedure: COLONOSCOPY WITH PROPOFOL;  Surgeon: Lavena Bullion, DO;  Location: [facility];  Service: Gastroenterology;  Laterality: N/A;  . DIAGNOSTIC LAPAROSCOPY  12yrs ago  . ESOPHAGEAL MANOMETRY N/A 09/04/2015   Procedure: ESOPHAGEAL MANOMETRY (EM);  Surgeon: Carol Ada, MD;  [facility]: [facility];  Service: Endoscopy;  Laterality: N/A;  . ESOPHAGOGASTRODUODENOSCOPY  04/03/2012   Procedure: ESOPHAGOGASTRODUODENOSCOPY (EGD);  Surgeon: Beryle Beams, MD;  Location: [facility];  Service: Endoscopy;  Laterality: N/A;  EGD w/ balloon dilation  . ESOPHAGOGASTRODUODENOSCOPY (EGD) WITH PROPOFOL N/A 02/18/2014   Procedure: ESOPHAGOGASTRODUODENOSCOPY (EGD) WITH PROPOFOL;  Surgeon: Beryle Beams, MD;  [facility];  Service: Endoscopy;  Laterality: N/A;  . ESOPHAGOGASTRODUODENOSCOPY (EGD) WITH PROPOFOL N/A 04/22/2014   Procedure: ESOPHAGOGASTRODUODENOSCOPY (EGD) WITH PROPOFOL;  Surgeon: Beryle Beams, MD;  [facility];  Service: Endoscopy;  Laterality: N/A;  . ESOPHAGOGASTRODUODENOSCOPY (EGD) WITH PROPOFOL N/A 08/24/2019   Procedure: ESOPHAGOGASTRODUODENOSCOPY (EGD) WITH  PROPOFOL;  Surgeon: Lavena Bullion, DO;  [facility]: [facility];  Service: Gastroenterology;  Laterality: N/A;  . NASAL SEPTUM SURGERY  09/2016  . [place] IMPEDANCE STUDY N/A 09/04/2015   Procedure: [place] IMPEDANCE STUDY;  Surgeon: Carol Ada, MD;  Location: [facility];  Service: Endoscopy;  Laterality: N/A;  . REPLACEMENT TOTAL KNEE Right 57yrs ago  . TOTAL KNEE ARTHROPLASTY Left 11/30/2012   Procedure: LEFT TOTAL KNEE ARTHROPLASTY;  Surgeon: Kerin Salen, MD;  Location: [place];  Service: Orthopedics;  Laterality: Left;    Current Medications: Current Meds  Medication Sig  . alendronate (FOSAMAX) 70 MG tablet Take 1 tablet (70 mg total) by mouth every 7 (seven) days. Take with a full glass of water on an empty stomach.  Marland Kitchen amLODipine (NORVASC) 5 MG tablet Take 1 tablet (5 mg total) by mouth daily.  . Aspirin-Salicylamide-Caffeine (BC HEADACHE PO) Take 1 packet by mouth daily as needed (headaches).  Marland Kitchen azelastine (ASTELIN) 0.1 % nasal spray Place 2 sprays into both nostrils 2 (two) times daily.  . cyanocobalamin (,VITAMIN B-12,) 1000 MCG/ML injection 104mcg SQ once weekly for 3 more weeks, then once monthly.  Marland Kitchen dexlansoprazole (DEXILANT) 60 MG capsule TAKE 1 CAPSULE(60 MG)  BY MOUTH DAILY  . eletriptan (RELPAX) 40 MG tablet TAKE 1 TABLET BY MOUTH AS NEEDED FOR MIGRAINE OR HEADACHE, MAY REPEAT IN 2 HOURS IF HEADACHE PERSISTS OR RECURS  . fluticasone (FLONASE) 50 MCG/ACT nasal spray SHAKE LIQUID AND USE 2 SPRAYS IN EACH NOSTRIL DAILY  . Lifitegrast (XIIDRA) 5 % SOLN Apply 1 drop to eye 2 (two) times daily.  . methocarbamol (ROBAXIN) 500 MG tablet Take 1 tablet (500 mg total) by mouth at bedtime as needed for muscle spasms.  . ondansetron (ZOFRAN ODT) 4 MG disintegrating tablet Take 1 tablet (4 mg total) by mouth every 8 (eight) hours as needed for nausea or vomiting.  . Syringe/Needle, Disp, (SYRINGE 3CC/23GX1") 23G X 1" 3 ML MISC Use as instructed  . topiramate (TOPAMAX) 100 MG tablet TAKE 1  TABLET(100 MG) BY MOUTH TWICE DAILY     Allergies:   Naratriptan   Social History   Socioeconomic History  . Marital status: Widowed    Spouse name: Not on file  . Number of children: 1  . Years of education: Not on file  . Highest education level: Some college, no degree  Occupational History  . Occupation: Retired  Tobacco Use  . Smoking status: Never Smoker  . Smokeless tobacco: Never Used  Vaping Use  . Vaping Use: Never used  Substance and Sexual Activity  . Alcohol use: No  . Drug use: No  . Sexual activity: Not Currently    Birth control/protection: Post-menopausal  Other Topics Concern  . 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 Difficulty of Paying Living Expenses: Not on file  Food Insecurity:   . Worried About Charity fundraiser in the Last Year: Not on file  . Ran Out of Food in the Last Year: Not on file  Transportation Needs:   . Lack of Transportation (Medical): Not on file  . Lack of Transportation (Non-Medical): Not on file  Physical Activity:   . Days of Exercise per Week: Not on file  . Minutes of Exercise per Session: Not on file  Stress:   . Feeling of Stress : Not on file  Social Connections:   . Frequency of Communication with Friends and Family: Not on file  . Frequency of Social Gatherings with Friends and Family: Not on file  . Attends Religious Services: Not on file  . Active Member of Clubs or Organizations: Not on file  . Attends Archivist Meetings: Not on file  . Marital Status: Not on file     Family History: The patient's family history includes Aneurysm in her sister; Arthritis in her brother, mother, sister, and  sister; Cancer in her mother; Depression in her mother; Healthy in her daughter; Heart disease in her father and mother; Heart murmur in her sister; Kidney disease in her father. There is no history of Colon cancer or Esophageal cancer.  ROS:   Please see the history of present illness.    All other systems reviewed and are negative.  EKGs/Labs/Other Studies Reviewed:    The following studies were reviewed today: EKG reveals sinus rhythm and nonspecific  ST-T changes   Recent Labs: 04/11/2020: Hemoglobin 13.1; Platelets 348.0; TSH 1.81 07/04/2020: ALT 8; BUN 14; Creat 0.99; Potassium 3.8; Sodium 142  Recent Lipid Panel    Component Value Date/Time   CHOL 246 (H) 01/07/2017 1115   TRIG 85.0 01/07/2017 1115   HDL 55.30 01/07/2017 1115   CHOLHDL 4 01/07/2017 1115   VLDL 17.0 01/07/2017 1115   LDLCALC 173 (H) 01/07/2017 1115    Physical Exam:    VS:  BP 134/82   Pulse 70   Ht 5\' 2"  (1.575 m)   Wt 143 lb 6.4 oz (65 kg)   SpO2 96%   BMI 26.23 kg/m     Wt Readings from Last 3 Encounters:  07/25/20 143 lb 6.4 oz (65 kg)  07/04/20 136 lb 3.2 oz (61.8 kg)  04/11/20 138 lb 3.2 oz (62.7 kg)     GEN: Patient is in no acute distress HEENT: Normal NECK: No JVD; No carotid bruits LYMPHATICS: No lymphadenopathy CARDIAC: S1 S2 regular, 2/6 systolic murmur at the apex. RESPIRATORY:  Clear to auscultation without rales, wheezing or rhonchi  ABDOMEN: Soft, non-tender, non-distended MUSCULOSKELETAL:  No edema; No deformity  SKIN: Warm and dry NEUROLOGIC:  Alert and oriented x 3 PSYCHIATRIC:  Normal affect    Signed, Jenean Lindau, MD  07/25/2020 2:01 PM    [place]

## 2020-07-25 NOTE — Addendum Note (Signed)
Addended by: Truddie Hidden on: 07/25/2020 02:20 PM   Modules accepted: Orders

## 2020-07-25 NOTE — Patient Instructions (Addendum)
Medication Instructions:  Your physician has recommended you make the following change in your medication:  Take Nitroglycerin as needed for chest pain. Take 81 mg coated aspirin daily.  *If you need a refill on your cardiac medications before your next appointment, please call your pharmacy*   Lab Work: Your physician recommends that you have labs done in the office today. Your test included  basic metabolic panel and complete blood count for your cath.  If you have labs (blood work) drawn today and your tests are completely normal, you will receive your results only by: Marland Kitchen MyChart Message (if you have MyChart) OR . A paper copy in the mail If you have any lab test that is abnormal or we need to change your treatment, we will call you to review the results.   Testing/Procedures:    [facility] [place], [place] [address] Dept: [phone] Loc: [phone]  Lindsay Frost  07/25/2020  You are scheduled for a Cardiac Catheterization on Monday, November 15 with Dr. Shelva Majestic.  1. Please arrive at the [facility] (Main Entrance A) at [facility]: [address] [place], [place] [address] at 5:30 AM (This time is two hours before your procedure to ensure your preparation). Free valet parking service is available.   Special note: Every effort is made to have your procedure done on time. Please understand that emergencies sometimes delay scheduled procedures.  2. Diet: Do not eat solid foods after midnight.  The patient may have clear liquids until 5am upon the day of the procedure.  3. Labs: You had your labs done in the office at your last visit.  4. Medication instructions in preparation for your procedure:   Contrast Allergy: No  On the morning of your procedure, take your Aspirin and any morning medicines NOT listed above.  You may use sips of water.  5. Plan  for one night stay--bring personal belongings. 6. Bring a current list of your medications and current insurance cards. 7. You MUST have a responsible person to drive you home. 8. Someone MUST be with you the first 24 hours after you arrive home or your discharge will be delayed. 9. Please wear clothes that are easy to get on and off and wear slip-on shoes.  Thank you for allowing [place] to care for you!   -- [facility]    Follow-Up: At [facility], you and your health needs are our priority.  As part of our continuing mission to provide you with exceptional heart care, we have created designated Provider Care Teams.  These Care Teams include your primary Cardiologist (physician) and Advanced Practice Providers (APPs -  Physician Assistants and Nurse Practitioners) who all work together to provide you with the care you need, when you need it.  We recommend signing up for the patient portal called "MyChart".  Sign up information is provided on this After Visit Summary.  MyChart is used to connect with patients for Virtual Visits (Telemedicine).  Patients are able to view lab/test results, encounter notes, upcoming appointments, etc.  Non-urgent messages can be sent to your provider as well.   To learn more about what you can do with MyChart, go to [email or web address].    Your next appointment:   1 month(s)  The format for your next appointment:   In Person  Provider:   Jyl Heinz, MD   Other Instructions  Coronary Angioplasty  Coronary angioplasty is a procedure to widen a narrowed or blocked blood vessel of the heart (coronary artery). The artery is usually blocked by cholesterol buildup (plaques) in the lining of the artery walls. When a vessel in the heart becomes partially blocked, there is decreased blood flow to that area. This may lead to chest pain or a heart attack (myocardial infarction). Tell a health care provider about:  Any  allergies you have, including allergies to shellfish or contrast dye.  All medicines you are taking, including vitamins, herbs, eye drops, creams, and over-the-counter medicines.  Any problems you or family members have had with anesthetic medicines.  Any blood disorders you have.  Any surgeries you have had.  Any medical conditions you have.  Whether you are pregnant or may be pregnant. What are the risks? Generally, this is a safe procedure. However, problems may occur, including:  Damage to other structures or organs. This may include damage to blood vessels, leading to rupture or bleeding.  Infection, bleeding, or bruising at the site where a small, thin tube (catheter) will be inserted.  Allergic reaction to the dye or contrast that is used.  Kidney damage from the dye or contrast that is used.  Blood clots that can lead to a stroke or heart attack.  Bleeding into the abdomen (retroperitoneal bleeding). What happens before the procedure? Staying hydrated Follow instructions from your health care provider about hydration, which may include:  Up to 2 hours before the procedure - you may continue to drink clear liquids, such as water, clear fruit juice, black coffee, and plain tea. Eating and drinking restrictions Follow instructions from your health care provider about eating and drinking, which may include:  8 hours before the procedure - stop eating heavy meals or foods such as meat, fried foods, or fatty foods.  6 hours before the procedure - stop eating light meals or foods, such as toast or cereal.  2 hours before the procedure - stop drinking clear liquids. Medicines  Ask your health care provider about: ? Changing or stopping your regular medicines. This is especially important if you are taking diabetes medicines or blood thinners. ? Whether aspirin is recommended before this procedure.  Ask your health care provider if you can take a sip of water with any  approved medicines the morning of the procedure. General instructions  Plan to have someone take you home from the hospital or clinic.  If you will be going home right after the procedure, plan to have someone with you for 24 hours. What happens during the procedure?  To reduce your risk of infection: ? Your health care team will wash or sanitize their hands. ? A germ-killing solution (antiseptic) will be used to wash the area where the catheter will be inserted. Hair may be removed from this area. The catheter may be inserted in:  Your groin area. This is the most common area.  The fold of your arm, near your elbow.  Your wrist.  An IV tube will be inserted into one of your veins.  You will be given a medicine to help you relax (sedative).  You will be given a medicine to numb the area where the catheter will be inserted (local anesthetic).  The catheter will be inserted into an artery.  The catheter will be guided to the narrowed or blocked artery using a type of X-ray (fluoroscopy).  When the catheter is near the heart, dye will be injected that makes  the narrowing or blockage visible on the X-ray.  Once the catheter is positioned at the narrowed or blocked portion of the blood vessel, a balloon will be inflated to make the artery wider. Expanding the balloon will crush the plaques into the wall of the vessel and improve the blood flow.  The artery may be made wider by removing plaques using a drill, laser, or other tools.  When the blood flow is better, the balloon will be deflated and the catheter will be removed.  A stent may be placed. This is common in this procedure.  After the catheter is removed, a special dressing will be placed over the insertion site. What happens after the procedure?  You will need to keep the area still for a few hours, or as long as directed by your health care provider. If the procedure was done in the groin, you will be instructed not to  bend or cross your legs.  The insertion site will be checked often.  The pulse in your feet or wrist will be checked often.  Additional blood tests, X-rays, and an electrocardiogram (ECG) may be done.  Do not drive for 24 hours if you were given a sedative. This information is not intended to replace advice given to you by your health care provider. Make sure you discuss any questions you have with your health care provider. Document Revised: 08/15/2017 Document Reviewed: 05/31/2016 Elsevier Patient Education  [place].   Aspirin and Your Heart  Aspirin is a medicine that prevents the cells in the blood that are used for clotting, called platelets, from sticking together. Aspirin can be used to help reduce the risk of blood clots, heart attacks, and other heart-related problems. Can I take aspirin? Your health care provider will help you determine whether it is safe and beneficial for you to take aspirin daily. Taking aspirin daily may be helpful if you:  Have had a heart attack or chest pain.  Are at risk for a heart attack.  Have undergone open-heart surgery, such as coronary artery bypass surgery (CABG).  Have had coronary angioplasty or a stent.  Have had certain types of stroke or transient ischemic attack (TIA).  Have peripheral artery disease (PAD).  Have chronic heart rhythm problems such as atrial fibrillation and cannot take an anticoagulant.  Have valve disease or have had surgery on a valve. What are the risks? Daily use of aspirin can cause side effects. Some of these include:  Bleeding. Bleeding problems can be minor or serious. An example of a minor problem is a cut that does not stop bleeding. An example of a more serious problem is stomach bleeding or, rarely, bleeding into the brain. Your risk of bleeding is increased if you are also taking non-steroidal anti-inflammatory drugs (NSAIDs).  Increased bruising.  Upset stomach.  An allergic  reaction. People who have nasal polyps have an increased risk of developing an aspirin allergy. General guidelines  Take aspirin only as told by your health care provider. Make sure that you understand how much you should take and what form you should take. The two forms of aspirin are: ? Non-enteric-coated.This type of aspirin does not have a coating and is absorbed quickly. This type of aspirin also comes in a chewable form. ? Enteric-coated. This type of aspirin has a coating that releases the medicine very slowly. Enteric-coated aspirin might cause less stomach upset than non-enteric-coated aspirin. This type of aspirin should not be chewed or crushed.  Limit  alcohol intake to no more than 1 drink a day for nonpregnant women and 2 drinks a day for men. Drinking alcohol increases your risk of bleeding. One drink equals 12 oz of beer, 5 oz of wine, or 1 oz of hard liquor. Contact a health care provider if you:  Have unusual bleeding or bruising.  Have stomach pain or nausea.  Have ringing in your ears.  Have an allergic reaction that causes: ? Hives. ? Itchy skin. ? Swelling of the lips, tongue, or face. Get help right away if you:  Notice that your bowel movements are bloody, dark red, or black in color.  Vomit or cough up blood.  Have blood in your urine.  Cough, have noisy breathing (wheeze), or feel short of breath.  Have chest pain, especially if the pain spreads to the arms, back, neck, or jaw.  Have a severe headache, or a headache with confusion, or dizziness. These symptoms may represent a serious problem that is an emergency. Do not wait to see if the symptoms will go away. Get medical help right away. Call your local emergency services (911 in the [place] Do not drive yourself to the hospital. Summary  Aspirin can be used to help reduce the risk of blood clots, heart attacks, and other heart-related problems.  Daily use of aspirin can increase your risk of side  effects. Your health care provider will help you determine whether it is safe and beneficial for you to take aspirin daily.  Take aspirin only as told by your health care provider. Make sure that you understand how much you can take and what form you can take. This information is not intended to replace advice given to you by your health care provider. Make sure you discuss any questions you have with your health care provider. Document Revised: 07/03/2017 Document Reviewed: 07/03/2017 Elsevier Patient Education  [place]. Nitroglycerin sublingual tablets What is this medicine? NITROGLYCERIN (nye troe GLI ser in) is a type of vasodilator. It relaxes blood vessels, increasing the blood and oxygen supply to your heart. This medicine is used to relieve chest pain caused by angina. It is also used to prevent chest pain before activities like climbing stairs, going outdoors in cold weather, or sexual activity. This medicine may be used for other purposes; ask your health care provider or pharmacist if you have questions. COMMON BRAND NAME(S): Nitroquick, Nitrostat, Nitrotab What should I tell my health care provider before I take this medicine? They need to know if you have any of these conditions:  anemia  head injury, recent stroke, or bleeding in the brain  liver disease  previous heart attack  an unusual or allergic reaction to nitroglycerin, other medicines, foods, dyes, or preservatives  pregnant or trying to get pregnant  breast-feeding How should I use this medicine? Take this medicine by mouth as needed. At the first sign of an angina attack (chest pain or tightness) place one tablet under your tongue. You can also take this medicine 5 to 10 minutes before an event likely to produce chest pain. Follow the directions on the prescription label. Let the tablet dissolve under the tongue. Do not swallow whole. Replace the dose if you accidentally swallow it. It will help if your  mouth is not dry. Saliva around the tablet will help it to dissolve more quickly. Do not eat or drink, smoke or chew tobacco while a tablet is dissolving. If you are not better within 5 minutes after taking ONE  dose of nitroglycerin, call 9-1-1 immediately to seek emergency medical care. Do not take more than 3 nitroglycerin tablets over 15 minutes. If you take this medicine often to relieve symptoms of angina, your doctor or health care professional may provide you with different instructions to manage your symptoms. If symptoms do not go away after following these instructions, it is important to call 9-1-1 immediately. Do not take more than 3 nitroglycerin tablets over 15 minutes. Talk to your pediatrician regarding the use of this medicine in children. Special care may be needed. Overdosage: If you think you have taken too much of this medicine contact a poison control center or emergency room at once. NOTE: This medicine is only for you. Do not share this medicine with others. What if I miss a dose? This does not apply. This medicine is only used as needed. What may interact with this medicine? Do not take this medicine with any of the following medications:  certain migraine medicines like ergotamine and dihydroergotamine (DHE)  medicines used to treat erectile dysfunction like sildenafil, tadalafil, and vardenafil  riociguat This medicine may also interact with the following medications:  alteplase  aspirin  heparin  medicines for high blood pressure  medicines for mental depression  other medicines used to treat angina  phenothiazines like chlorpromazine, mesoridazine, prochlorperazine, thioridazine This list may not describe all possible interactions. Give your health care provider a list of all the medicines, herbs, non-prescription drugs, or dietary supplements you use. Also tell them if you smoke, drink alcohol, or use illegal drugs. Some items may interact with your  medicine. What should I watch for while using this medicine? Tell your doctor or health care professional if you feel your medicine is no longer working. Keep this medicine with you at all times. Sit or lie down when you take your medicine to prevent falling if you feel dizzy or faint after using it. Try to remain calm. This will help you to feel better faster. If you feel dizzy, take several deep breaths and lie down with your feet propped up, or bend forward with your head resting between your knees. You may get drowsy or dizzy. Do not drive, use machinery, or do anything that needs mental alertness until you know how this drug affects you. Do not stand or sit up quickly, especially if you are an older patient. This reduces the risk of dizzy or fainting spells. Alcohol can make you more drowsy and dizzy. Avoid alcoholic drinks. Do not treat yourself for coughs, colds, or pain while you are taking this medicine without asking your doctor or health care professional for advice. Some ingredients may increase your blood pressure. What side effects may I notice from receiving this medicine? Side effects that you should report to your doctor or health care professional as soon as possible:  blurred vision  dry mouth  skin rash  sweating  the feeling of extreme pressure in the head  unusually weak or tired Side effects that usually do not require medical attention (report to your doctor or health care professional if they continue or are bothersome):  flushing of the face or neck  headache  irregular heartbeat, palpitations  nausea, vomiting This list may not describe all possible side effects. Call your doctor for medical advice about side effects. You may report side effects to FDA at 1-800-FDA-1088. Where should I keep my medicine? Keep out of the reach of children. Store at room temperature between 20 and 25 degrees C (68  and 77 degrees F). Store in [employer]. Protect from  light and moisture. Keep tightly closed. Throw away any unused medicine after the expiration date. NOTE: This sheet is a summary. It may not cover all possible information. If you have questions about this medicine, talk to your doctor, pharmacist, or health care provider.  2020 Elsevier/Gold Standard (2013-07-01 17:57:36)

## 2020-07-26 LAB — CBC WITH DIFFERENTIAL/PLATELET
Basophils Absolute: 0.1 10*3/uL (ref 0.0–0.2)
Basos: 3 %
EOS (ABSOLUTE): 0.2 10*3/uL (ref 0.0–0.4)
Eos: 5 %
Hematocrit: 40.2 % (ref 34.0–46.6)
Hemoglobin: 13.5 g/dL (ref 11.1–15.9)
Immature Grans (Abs): 0 10*3/uL (ref 0.0–0.1)
Immature Granulocytes: 0 %
Lymphocytes Absolute: 1.8 10*3/uL (ref 0.7–3.1)
Lymphs: 38 %
MCH: 30.1 pg (ref 26.6–33.0)
MCHC: 33.6 g/dL (ref 31.5–35.7)
MCV: 90 fL (ref 79–97)
Monocytes Absolute: 0.4 10*3/uL (ref 0.1–0.9)
Monocytes: 9 %
Neutrophils Absolute: 2 10*3/uL (ref 1.4–7.0)
Neutrophils: 45 %
Platelets: 354 10*3/uL (ref 150–450)
RBC: 4.48 x10E6/uL (ref 3.77–5.28)
RDW: 12.8 % (ref 11.7–15.4)
WBC: 4.6 10*3/uL (ref 3.4–10.8)

## 2020-07-26 LAB — BASIC METABOLIC PANEL
BUN/Creatinine Ratio: 15 (ref 12–28)
BUN: 15 mg/dL (ref 8–27)
CO2: 22 mmol/L (ref 20–29)
Calcium: 9.3 mg/dL (ref 8.7–10.3)
Chloride: 108 mmol/L — ABNORMAL HIGH (ref 96–106)
Creatinine, Ser: 1.02 mg/dL — ABNORMAL HIGH (ref 0.57–1.00)
GFR calc Af Amer: 67 mL/min/{1.73_m2} (ref >59)
GFR calc non Af Amer: 58 mL/min/{1.73_m2} — ABNORMAL LOW (ref >59)
Glucose: 75 mg/dL (ref 65–99)
Potassium: 4 mmol/L (ref 3.5–5.2)
Sodium: 142 mmol/L (ref 134–144)

## 2020-07-27 ENCOUNTER — Telehealth: Payer: Self-pay | Admitting: *Deleted

## 2020-07-27 NOTE — Telephone Encounter (Signed)
Call placed to patient to review procedure instructions, no answer, voicemail message. °

## 2020-07-27 NOTE — Telephone Encounter (Signed)
Pt contacted pre-catheterization scheduled at [facility] for: Monday July 31, 2020 7:30 AM Verified arrival time and place: [facility]) at: 5:30 AM   No solid food after midnight prior to cath, clear liquids until 5 AM day of procedure.   AM meds can be  taken pre-cath with sips of water including: ASA 81 mg   Confirmed patient has responsible adult to drive home post procedure and be with patient first 24 hours after arriving home:  You are allowed ONE visitor in the waiting room during the time you are at the hospital for your procedure. Both you and your visitor must wear a mask once you enter the hospital.       COVID-19 Pre-Screening Questions:  . In the past 14 days have you had a new cough, new headache, new nasal congestion, fever (100.4 or greater) unexplained body aches, new sore throat, or sudden loss of taste or sense of smell? Marland Kitchen In the past 14 days have you been around anyone with known Covid 19?    LMTCB to review procedure instructions

## 2020-07-28 ENCOUNTER — Telehealth: Payer: Self-pay

## 2020-07-28 NOTE — Telephone Encounter (Signed)
Cath lab aware that pt refuses to have the cath or to discuss. Dr. Geraldo Pitter has attempted to call the pt multiple times throughout the day as well as I and messages were left but no return phone call.

## 2020-07-28 NOTE — Telephone Encounter (Signed)
Message left of both of pt's phone numbers to confirm cancellation of cath scheduled 07/31/20.

## 2020-07-28 NOTE — Telephone Encounter (Signed)
Message left for pt to callback to discuss the cancellation of her upcoming cath.

## 2020-07-29 ENCOUNTER — Other Ambulatory Visit (HOSPITAL_COMMUNITY): Payer: Medicare Other

## 2020-07-31 ENCOUNTER — Ambulatory Visit (HOSPITAL_COMMUNITY): Admission: RE | Admit: 2020-07-31 | Payer: Medicare Other | Source: Home / Self Care | Admitting: Cardiovascular Disease

## 2020-07-31 ENCOUNTER — Encounter (HOSPITAL_COMMUNITY): Admission: RE | Payer: Self-pay | Source: Home / Self Care

## 2020-07-31 SURGERY — LEFT HEART CATH AND CORONARY ANGIOGRAPHY
Anesthesia: LOCAL

## 2020-07-31 NOTE — Telephone Encounter (Signed)
[facility] cancelled by patient, see phone note 07/28/20.

## 2020-08-15 ENCOUNTER — Encounter: Payer: Self-pay | Admitting: Internal Medicine

## 2020-08-15 ENCOUNTER — Telehealth (INDEPENDENT_AMBULATORY_CARE_PROVIDER_SITE_OTHER): Payer: Medicare Other | Admitting: Internal Medicine

## 2020-08-15 VITALS — BP 130/85 | HR 85 | Ht 62.0 in | Wt 135.0 lb

## 2020-08-15 DIAGNOSIS — J069 Acute upper respiratory infection, unspecified: Secondary | ICD-10-CM | POA: Diagnosis not present

## 2020-08-15 MED ORDER — AMOXICILLIN 875 MG PO TABS
875.0000 mg | ORAL_TABLET | Freq: Two times a day (BID) | ORAL | 0 refills | Status: DC
Start: 2020-08-15 — End: 2021-01-02

## 2020-08-15 NOTE — Progress Notes (Signed)
Subjective:    Patient ID: Lindsay Frost, female    DOB: 03-Sep-1956, 64 y.o.   MRN: [id number]  DOS:  08/15/2020 Type of visit - description: Virtual Visit via Video Note  I connected with the above patient  by a video enabled telemedicine application and verified that I am speaking with the correct person using two identifiers.   THIS ENCOUNTER IS A VIRTUAL VISIT DUE TO COVID-19 - PATIENT WAS NOT SEEN IN THE OFFICE. PATIENT HAS CONSENTED TO VIRTUAL VISIT / TELEMEDICINE VISIT   Location of patient: home  Location of provider: office  Persons participating in the virtual visit: patient, provider   I discussed the limitations of evaluation and management by telemedicine and the availability of in person appointments. The patient expressed understanding and agreed to proceed.  Acute Symptoms a started on and off 2 weeks ago but they are definitely worse for the last 3 days: Nasal discharge, bloody at times, sinus headache, cough with dark yellow sputum. She also has postnasal dripping and left ear pain. Some malaise and occasional dizzy when she walks.    Review of Systems Denies chest pain or difficulty breathing. Has checked her temperature, no fever. Mild nausea sometimes, no myalgias.   Past Medical History:  Diagnosis Date  . Allergic rhinitis 07/16/2016  . Arthritis   . Back pain    arthritis  . Depression    takes cymbalta daily  . Duodenal stenosis   . Epigastric pain 07/29/2018  . Essential hypertension 05/14/2019  . Gallstones   . GERD (gastroesophageal reflux disease)    takes Omeprazole daily  . Headache(784.0)    migraines  . Heart murmur   . Hiatal hernia   . History of gastric ulcer   . History of migraine    last one about 2wks ago-takes Imitrex prn  . History of seizures   . Joint pain   . Joint swelling   . 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[place]) 01/13/2015  . Meningioma ([place])   . Migraine without aura and  without status migrainosus, not intractable 01/13/2015  . Nocturnal leg cramps 02/20/2015  . Obesity (BMI 30-39.9) 07/22/2014  . Osteoarthritis of left knee 12/02/2012  . Peptic ulcer disease   . Physical exam 07/22/2014  . Seizure-like activity ([place]) 05/12/2014  . Ulcer     Past Surgical History:  Procedure Laterality Date  . BALLOON DILATION  04/03/2012   Procedure: BALLOON DILATION;  Surgeon: Beryle Beams, MD;  Location: [facility];  Service: Endoscopy;  Laterality: N/A;  . BIOPSY  08/24/2019   Procedure: BIOPSY;  Surgeon: Lavena Bullion, DO;  Location: [facility];  Service: Gastroenterology;;  EGD and COLON  . BRAVO [place] STUDY N/A 08/24/2019   Procedure: BRAVO [place];  Surgeon: Lavena Bullion, DO;  Location: [facility];  Service: Gastroenterology;  Laterality: N/A;  . CHOLECYSTECTOMY N/A 04/24/2018   Procedure: LAPAROSCOPIC CHOLECYSTECTOMY;  Surgeon: Clovis Riley, MD;  Location: [facility];  Service: General;  Laterality: N/A;  . COLONOSCOPY    . COLONOSCOPY WITH PROPOFOL N/A 08/24/2019   Procedure: COLONOSCOPY WITH PROPOFOL;  Surgeon: Lavena Bullion, DO;  [facility]: [facility];  Service: Gastroenterology;  Laterality: N/A;  . DIAGNOSTIC LAPAROSCOPY  26yrs ago  . ESOPHAGEAL MANOMETRY N/A 09/04/2015   Procedure: ESOPHAGEAL MANOMETRY (EM);  Surgeon: Carol Ada, MD;  [facility];  Service: Endoscopy;  Laterality: N/A;  . ESOPHAGOGASTRODUODENOSCOPY  04/03/2012   Procedure: ESOPHAGOGASTRODUODENOSCOPY (EGD);  Surgeon: Tory Emerald  Benson Norway, MD;  Location: [facility];  Service: Endoscopy;  Laterality: N/A;  EGD w/ balloon dilation  . ESOPHAGOGASTRODUODENOSCOPY (EGD) WITH PROPOFOL N/A 02/18/2014   Procedure: ESOPHAGOGASTRODUODENOSCOPY (EGD) WITH PROPOFOL;  Surgeon: Beryle Beams, MD;  [facility];  Service: Endoscopy;  Laterality: N/A;  . ESOPHAGOGASTRODUODENOSCOPY (EGD) WITH PROPOFOL N/A 04/22/2014   Procedure: ESOPHAGOGASTRODUODENOSCOPY (EGD) WITH PROPOFOL;   Surgeon: Beryle Beams, MD;  [facility];  Service: Endoscopy;  Laterality: N/A;  . ESOPHAGOGASTRODUODENOSCOPY (EGD) WITH PROPOFOL N/A 08/24/2019   Procedure: ESOPHAGOGASTRODUODENOSCOPY (EGD) WITH PROPOFOL;  Surgeon: Lavena Bullion, DO;  [facility];  Service: Gastroenterology;  Laterality: N/A;  . NASAL SEPTUM SURGERY  09/2016  . [place] IMPEDANCE STUDY N/A 09/04/2015   Procedure: [place] IMPEDANCE STUDY;  Surgeon: Carol Ada, MD;  [facility]: [facility];  Service: Endoscopy;  Laterality: N/A;  . REPLACEMENT TOTAL KNEE Right 33yrs ago  . TOTAL KNEE ARTHROPLASTY Left 11/30/2012   Procedure: LEFT TOTAL KNEE ARTHROPLASTY;  Surgeon: Kerin Salen, MD;  Location: [place];  Service: Orthopedics;  Laterality: Left;    Allergies as of 08/15/2020      Reactions   Naratriptan    Ineffective      Medication List       Accurate as of August 15, 2020 11:59 PM. If you have any questions, ask your nurse or doctor.        alendronate 70 MG tablet Commonly known as: FOSAMAX Take 1 tablet (70 mg total) by mouth every 7 (seven) days. Take with a full glass of water on an empty stomach.   amLODipine 5 MG tablet Commonly known as: NORVASC Take 1 tablet (5 mg total) by mouth daily.   amoxicillin 875 MG tablet Commonly known as: AMOXIL Take 1 tablet (875 mg total) by mouth 2 (two) times daily. Started by: Kathlene November, MD   aspirin EC 81 MG tablet Take 1 tablet (81 mg total) by mouth daily. Swallow whole.   azelastine 0.1 % nasal spray Commonly known as: ASTELIN Place 2 sprays into both nostrils 2 (two) times daily.   BC HEADACHE PO Take 1 packet by mouth daily as needed (headaches).   cyanocobalamin 1000 MCG/ML injection Commonly known as: (VITAMIN B-12) 1032mcg SQ once weekly for 3 more weeks, then once monthly.   Dexilant 60 MG capsule Generic drug: dexlansoprazole TAKE 1 CAPSULE(60 MG) BY MOUTH DAILY   eletriptan 40 MG tablet Commonly known as: RELPAX TAKE 1 TABLET  BY MOUTH AS NEEDED FOR MIGRAINE OR HEADACHE, MAY REPEAT IN 2 HOURS IF HEADACHE PERSISTS OR RECURS   fluticasone 50 MCG/ACT nasal spray Commonly known as: FLONASE SHAKE LIQUID AND USE 2 SPRAYS IN EACH NOSTRIL DAILY   methocarbamol 500 MG tablet Commonly known as: Robaxin Take 1 tablet (500 mg total) by mouth at bedtime as needed for muscle spasms.   nitroGLYCERIN 0.4 MG SL tablet Commonly known as: NITROSTAT Place 1 tablet (0.4 mg total) under the tongue every 5 (five) minutes as needed.   ondansetron 4 MG disintegrating tablet Commonly known as: Zofran ODT Take 1 tablet (4 mg total) by mouth every 8 (eight) hours as needed for nausea or vomiting.   SYRINGE 3CC/23GX1" 23G X 1" 3 ML Misc Use as instructed   topiramate 100 MG tablet Commonly known as: TOPAMAX TAKE 1 TABLET(100 MG) BY MOUTH TWICE DAILY   Xiidra 5 % Soln Generic drug: Lifitegrast Apply 1 drop to eye 2 (two) times daily.  Objective:   Physical Exam BP 130/85   Pulse 85   Ht 5\' 2"  (1.575 m)   Wt 135 lb (61.2 kg)   BMI 24.69 kg/m  This is a virtual video visit, she looks well, alert oriented x3, in no distress, speaking in complete sentences    Assessment      64 year old female, PMH includes laparoscopic cholecystectomy 04-2018, GERD, migraines, seizures, depression, Presents with:  URI, vs bronchitis vs. sinusitis or a viral syndrome: Evaluation is limited as I cannot examine the patient, she has respiratory symptoms for 2 weeks, worse for 3 days. She does not have diabetes, hypertension.  Not a smoker. Has not gotten any Covid vaccine or flu shot. Plan: Recommend to be tested for Covid, PCR.  To let me know the results when available. Fluids, Mucinex DM, Flonase.  We agreed to send antibiotics as well, possibly sinusitis versus bronchitis.     I discussed the assessment and treatment plan with the patient. The patient was provided an opportunity to ask questions and all were answered. The  patient agreed with the plan and demonstrated an understanding of the instructions.   The patient was advised to call back or seek an in-person evaluation if the symptoms worsen or if the condition fails to improve as anticipated.

## 2020-08-17 ENCOUNTER — Encounter: Payer: Self-pay | Admitting: Internal Medicine

## 2020-08-21 ENCOUNTER — Telehealth: Payer: Self-pay | Admitting: Gastroenterology

## 2020-08-21 ENCOUNTER — Other Ambulatory Visit: Payer: Self-pay | Admitting: Gastroenterology

## 2020-08-21 ENCOUNTER — Other Ambulatory Visit: Payer: Medicare Other

## 2020-08-21 DIAGNOSIS — R197 Diarrhea, unspecified: Secondary | ICD-10-CM

## 2020-08-21 NOTE — Telephone Encounter (Signed)
Spoke to patient who reports having diarrhea over the past 3 days. She was recently put on Amoxil 875 mg bid for an URI on 08/15/20. She stopped stopped the ABX on Friday due to diarrhea. Patient was advised to contact her PCP to let them know she has stopped the Amoxil and may need a  different antibiotic. She is taking a Probiotic daily along with Imodium AD every 6 hours. She continues to have diarrhea 6-8 times a day. No blood noted no fever. She was advised to keep hydrated with  electrolyte type fluids,and stick to a bland diet. I let her know that Dr Bryan Lemma would be informed if a stronger antidiarrheal would help and if stool studies were indicated. Will contact her with any recommendations.

## 2020-08-21 NOTE — Telephone Encounter (Signed)
Pt is requesting a call back from a nurse to discuss her diarrhea she has been experiencing for 3 days, pt would like to know what she can do.

## 2020-08-21 NOTE — Telephone Encounter (Signed)
Agree with the recommendations as outlined.  Yes, please send for GI PCR panel and C. difficile testing.  Thank you.

## 2020-08-21 NOTE — Telephone Encounter (Signed)
Spoke to patient to inform her of dr Cirigliano's recommendations. She will go to the lab today for stool studies. We will contact her with the results. Patient voiced understanding.

## 2020-08-22 ENCOUNTER — Ambulatory Visit: Payer: Medicare Other | Admitting: Cardiology

## 2020-08-22 LAB — CLOSTRIDIUM DIFFICILE TOXIN B, QUALITATIVE, REAL-TIME PCR: Toxigenic C. Difficile by PCR: NOT DETECTED

## 2020-08-24 ENCOUNTER — Telehealth: Payer: Self-pay | Admitting: General Surgery

## 2020-08-24 LAB — GI PROFILE, STOOL, PCR

## 2020-08-24 NOTE — Telephone Encounter (Signed)
Notified the patient of negative gi profile stool. Patient stated she saw the negative study on her my chart. Loose stools have slowed down and the patient is starting to feel better, taking imodium and a probiotic. Patient instructed to contct [place] back if she needs anything,

## 2020-10-04 ENCOUNTER — Other Ambulatory Visit: Payer: Self-pay | Admitting: Family

## 2020-10-11 ENCOUNTER — Ambulatory Visit: Payer: Medicare Other

## 2020-10-11 DIAGNOSIS — Z20822 Contact with and (suspected) exposure to covid-19: Secondary | ICD-10-CM | POA: Diagnosis not present

## 2020-10-18 ENCOUNTER — Other Ambulatory Visit: Payer: Self-pay

## 2020-10-18 ENCOUNTER — Telehealth (INDEPENDENT_AMBULATORY_CARE_PROVIDER_SITE_OTHER): Payer: Medicare Other | Admitting: Medical

## 2020-10-18 VITALS — BP 127/92 | HR 80 | Temp 98.3°F

## 2020-10-18 DIAGNOSIS — J40 Bronchitis, not specified as acute or chronic: Secondary | ICD-10-CM

## 2020-10-18 DIAGNOSIS — J0100 Acute maxillary sinusitis, unspecified: Secondary | ICD-10-CM

## 2020-10-18 DIAGNOSIS — R059 Cough, unspecified: Secondary | ICD-10-CM | POA: Diagnosis not present

## 2020-10-18 MED ORDER — BENZONATATE 100 MG PO CAPS: 100.0000 mg | ORAL_CAPSULE | Freq: Three times a day (TID) | ORAL | 0 refills | Status: DC | PRN

## 2020-10-18 MED ORDER — AZITHROMYCIN 250 MG PO TABS: ORAL_TABLET | ORAL | 0 refills | Status: DC

## 2020-10-18 NOTE — Patient Instructions (Addendum)
Recent constellation of signs symptoms over the past 2 weeks it sounds like might it started off as a viral illness. Presently has sinus pressure and productive cough with mild shortness of breath. No wheezing reported. About 4 days ago tested negative for Covid but PCR per patient report.  Recommend a azithromycin antibiotic, benzonatate for cough and continue Nasonex nasal spray. Recommend stopping Mucinex.  Some nausea  recently so we'll make Zofran available to use if needed.  Placed chest x-ray order. Explained can get that done today. Also advised getting O2 sat monitor over-the-counter and checking oxygen saturations daily. Explained 96% above is normal. Would want to know with O2 sats 94% or less. Patient expressed understanding.  Follow-up in 7 days or as needed.

## 2020-10-18 NOTE — Progress Notes (Addendum)
   Subjective:    Patient ID: Lindsay Frost, female    DOB: April 12, 1956, 65 y.o.   MRN: [id number]  HPI  Virtual Visit via Video Note  I connected with Payton Doughty Bouska on 10/18/20 at 10:20 AM EST by a video enabled telemedicine application and verified that I am speaking with the correct person using two identifiers.  Location: Patient: home Provider: office  [place]   I discussed the limitations of evaluation and management by telemedicine and the availability of in person appointments. The patient expressed understanding and agreed to proceed.  History of Present Illness: Pt states for past 2 weeks she has had sinus pressure, ear pressure and bad cough. She thinks fluid in ears since little of balance. She has had some sneezing. She has tried nasal spray nasonex, mucinex and ibuprofen.  Pt thinks has sinus infection. Pt had no fever, no chills or sweats. Early on had some achiness/muscle aches. Some nausea.  Pt got pcr test back on January 28 th and was negative.  Pt never had vaccine against covid.  Pt is coughing up mucus. No wheezing. But some shortness of breath at times.    Observations/Objective: General-no acute distress, pleasant, oriented. Lungs- on inspection lungs appear unlabored. Neck- no tracheal deviation or jvd on inspection. Neuro- gross motor function appears intact. heent- frontal and maxillary pressure on self palpation.   Assessment and Plan: Recent constellation of signs symptoms over the past 2 weeks it sounds like might it started off as a viral illness. Presently has sinus pressure and productive cough with mild shortness of breath. No wheezing reported. About 4 days ago tested negative for Covid but PCR per patient report.  Recommend a azithromycin antibiotic, benzonatate for cough and continue Nasonex nasal spray. Recommend stopping Mucinex.  Some nausea recently so we'll make Zofran available to use if needed.  Placed chest x-ray order.  Explained can get that done today. Also advised getting O2 sat monitor over-the-counter and checking oxygen saturations daily. Explained 96% above is normal. Would want to know with O2 sats 94% or less. Patient expressed understanding.  Follow-up in 7 days or as needed.  Follow Up Instructions:    I discussed the assessment and treatment plan with the patient. The patient was provided an opportunity to ask questions and all were answered. The patient agreed with the plan and demonstrated an understanding of the instructions.   The patient was advised to call back or seek an in-person evaluation if the symptoms worsen or if the condition fails to improve as anticipated.  Time spent with patient today was  20 minutes which consisted of chart revdiew, discussing diagnosis, work up treatment and documentation.   Esperanza Richters, PA-C   Review of Systems     Objective:   Physical Exam        Assessment & Plan:

## 2020-10-19 ENCOUNTER — Encounter: Payer: Self-pay | Admitting: Medical

## 2020-10-20 ENCOUNTER — Ambulatory Visit (HOSPITAL_BASED_OUTPATIENT_CLINIC_OR_DEPARTMENT_OTHER)
Admission: RE | Admit: 2020-10-20 | Discharge: 2020-10-20 | Disposition: A | Discharge status: Home / Self Care | Payer: Medicare Other | Source: Ambulatory Visit | Attending: Medical | Admitting: Medical

## 2020-10-20 ENCOUNTER — Other Ambulatory Visit: Payer: Self-pay

## 2020-10-20 DIAGNOSIS — R059 Cough, unspecified: Secondary | ICD-10-CM | POA: Insufficient documentation

## 2020-10-26 ENCOUNTER — Encounter: Payer: Self-pay | Admitting: Family

## 2020-11-01 ENCOUNTER — Other Ambulatory Visit: Payer: Self-pay | Admitting: Neurology
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
## 2020-11-13 ENCOUNTER — Other Ambulatory Visit: Payer: Self-pay

## 2020-11-13 ENCOUNTER — Encounter: Payer: Self-pay | Admitting: Neurology

## 2020-11-13 ENCOUNTER — Ambulatory Visit (INDEPENDENT_AMBULATORY_CARE_PROVIDER_SITE_OTHER): Payer: Medicare Other | Admitting: Neurology

## 2020-11-13 VITALS — BP 148/84 | HR 84 | Resp 18 | Ht 64.0 in | Wt 144.0 lb

## 2020-11-13 DIAGNOSIS — G40209 Localization-related (focal) (partial) symptomatic epilepsy and epileptic syndromes with complex partial seizures, not intractable, without status epilepticus: Secondary | ICD-10-CM | POA: Diagnosis not present

## 2020-11-13 DIAGNOSIS — R42 Dizziness and giddiness: Secondary | ICD-10-CM

## 2020-11-13 DIAGNOSIS — J329 Chronic sinusitis, unspecified: Secondary | ICD-10-CM

## 2020-11-13 DIAGNOSIS — G43009 Migraine without aura, not intractable, without status migrainosus: Secondary | ICD-10-CM | POA: Diagnosis not present

## 2020-11-13 NOTE — Patient Instructions (Signed)
1.  Continue topiramate 2.  Refer to ENT, Dr. Janace Hoard 3.  Increase water intake 4.  Follow up with PCP regarding dizziness and blood pressure 5.  Follow up with me in one year or sooner if needed.

## 2020-12-05 DIAGNOSIS — H6505 Acute serous otitis media, recurrent, left ear: Secondary | ICD-10-CM

## 2020-12-05 DIAGNOSIS — J329 Chronic sinusitis, unspecified: Secondary | ICD-10-CM

## 2020-12-05 DIAGNOSIS — J343 Hypertrophy of nasal turbinates: Secondary | ICD-10-CM | POA: Diagnosis not present

## 2020-12-05 HISTORY — DX: Acute serous otitis media, recurrent, left ear: H65.05

## 2020-12-05 HISTORY — DX: Chronic sinusitis, unspecified: J32.9

## 2020-12-13 ENCOUNTER — Other Ambulatory Visit: Payer: Self-pay | Admitting: Neurology

## 2020-12-15 DIAGNOSIS — Z1231 Encounter for screening mammogram for malignant neoplasm of breast: Secondary | ICD-10-CM | POA: Diagnosis not present

## 2020-12-15 LAB — HM MAMMOGRAPHY

## 2020-12-20 DIAGNOSIS — R922 Inconclusive mammogram: Secondary | ICD-10-CM | POA: Diagnosis not present

## 2020-12-20 DIAGNOSIS — R928 Other abnormal and inconclusive findings on diagnostic imaging of breast: Secondary | ICD-10-CM | POA: Diagnosis not present

## 2020-12-25 DIAGNOSIS — J0110 Acute frontal sinusitis, unspecified: Secondary | ICD-10-CM | POA: Diagnosis not present

## 2020-12-25 DIAGNOSIS — J3489 Other specified disorders of nose and nasal sinuses: Secondary | ICD-10-CM | POA: Diagnosis not present

## 2020-12-25 DIAGNOSIS — J321 Chronic frontal sinusitis: Secondary | ICD-10-CM | POA: Diagnosis not present

## 2020-12-25 DIAGNOSIS — J342 Deviated nasal septum: Secondary | ICD-10-CM | POA: Diagnosis not present

## 2021-01-02 ENCOUNTER — Ambulatory Visit (INDEPENDENT_AMBULATORY_CARE_PROVIDER_SITE_OTHER): Payer: Medicare Other | Admitting: Family

## 2021-01-02 ENCOUNTER — Other Ambulatory Visit: Payer: Self-pay

## 2021-01-02 VITALS — BP 134/89 | HR 85 | Temp 98.2°F | Resp 16 | Wt 143.0 lb

## 2021-01-02 DIAGNOSIS — E538 Deficiency of other specified B group vitamins: Secondary | ICD-10-CM

## 2021-01-02 DIAGNOSIS — J309 Allergic rhinitis, unspecified: Secondary | ICD-10-CM | POA: Diagnosis not present

## 2021-01-02 DIAGNOSIS — R350 Frequency of micturition: Secondary | ICD-10-CM

## 2021-01-02 DIAGNOSIS — I10 Essential (primary) hypertension: Secondary | ICD-10-CM

## 2021-01-02 DIAGNOSIS — N3281 Overactive bladder: Secondary | ICD-10-CM

## 2021-01-02 HISTORY — DX: Deficiency of other specified B group vitamins: E53.8

## 2021-01-02 HISTORY — DX: Overactive bladder: N32.81

## 2021-01-02 MED ORDER — CYANOCOBALAMIN 1000 MCG/ML IJ SOLN
1000.0000 ug | Freq: Once | INTRAMUSCULAR | Status: AC
  Administered 2021-01-02: 1000 ug via INTRAMUSCULAR

## 2021-01-02 MED ORDER — MIRABEGRON ER 25 MG PO TB24: 25.0000 mg | ORAL_TABLET | Freq: Every day | ORAL | 2 refills | Status: DC

## 2021-01-02 NOTE — Progress Notes (Signed)
Acute Office Visit  Subjective:    Patient ID: Lindsay Frost, female    DOB: [birth year], 65 y.o.   MRN: [id number]  Chief Complaint  Patient presents with  . Hypertension    Here for follow up  . Sinus Problem    Complains of sinus congestion and headache    HPI  HTN- maintained on amlodipine 5mg  once daily.  BP Readings from Last 3 Encounters:  01/02/21 134/89  11/13/20 (!) 148/84  10/18/20 (!) 127/92   Sinusitis- waking up with sinus headache every day this week. Having some dizziness (saw ENT 1 month ago and was told that she has a lot of fluid in one of her ears).  She had a sinus CT last Monday and she reports that she is waiting on the results.    Urinary frequency- has seen Dr. Janice Norrie and was told that she had a small bladder.  Past Medical History:  Diagnosis Date  . Allergic rhinitis 07/16/2016  . Angina pectoris ([place]) 07/25/2020  . Arthritis   . Back pain    arthritis  . Depression    takes cymbalta daily  . Duodenal stenosis   . Epigastric pain 07/29/2018  . Essential hypertension 05/14/2019  . Gallstones   . GERD (gastroesophageal reflux disease)    takes Omeprazole daily  . Headache(784.0)    migraines  . Heart murmur   . Hiatal hernia   . History of gastric ulcer   . History of migraine    last one about 2wks ago-takes Imitrex prn  . History of seizures   . Joint pain   . Joint swelling   . 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[place]) 01/13/2015  . Meningioma ([place])   . Migraine without aura and without status migrainosus, not intractable 01/13/2015  . Mixed dyslipidemia 07/25/2020  . Nocturnal leg cramps 02/20/2015  . Obesity (BMI 30-39.9) 07/22/2014  . Osteoarthritis of left knee 12/02/2012  . Peptic ulcer disease   . Physical exam 07/22/2014  . Seizure-like activity ([place]) 05/12/2014  . Ulcer     Past Surgical History:  Procedure Laterality Date  . BALLOON DILATION  04/03/2012   Procedure: BALLOON DILATION;  Surgeon:  Beryle Beams, MD;  [facility];  Service: Endoscopy;  Laterality: N/A;  . BIOPSY  08/24/2019   Procedure: BIOPSY;  Surgeon: Lavena Bullion, DO;  [facility];  Service: Gastroenterology;;  EGD and COLON  . BRAVO [place] STUDY N/A 08/24/2019   Procedure: BRAVO [place];  Surgeon: Lavena Bullion, DO;  [facility];  Service: Gastroenterology;  Laterality: N/A;  . CHOLECYSTECTOMY N/A 04/24/2018   Procedure: LAPAROSCOPIC CHOLECYSTECTOMY;  Surgeon: Clovis Riley, MD;  [facility];  Service: General;  Laterality: N/A;  . COLONOSCOPY    . COLONOSCOPY WITH PROPOFOL N/A 08/24/2019   Procedure: COLONOSCOPY WITH PROPOFOL;  Surgeon: Lavena Bullion, DO;  [facility];  Service: Gastroenterology;  Laterality: N/A;  . DIAGNOSTIC LAPAROSCOPY  47yrs ago  . ESOPHAGEAL MANOMETRY N/A 09/04/2015   Procedure: ESOPHAGEAL MANOMETRY (EM);  Surgeon: Carol Ada, MD;  [facility];  Service: Endoscopy;  Laterality: N/A;  . ESOPHAGOGASTRODUODENOSCOPY  04/03/2012   Procedure: ESOPHAGOGASTRODUODENOSCOPY (EGD);  Surgeon: Beryle Beams, MD;  [facility];  Service: Endoscopy;  Laterality: N/A;  EGD w/ balloon dilation  . ESOPHAGOGASTRODUODENOSCOPY (EGD) WITH PROPOFOL N/A 02/18/2014   Procedure: ESOPHAGOGASTRODUODENOSCOPY (EGD) WITH PROPOFOL;  Surgeon: Beryle Beams, MD;  [facility];  Service:  Endoscopy;  Laterality: N/A;  . ESOPHAGOGASTRODUODENOSCOPY (EGD) WITH PROPOFOL N/A 04/22/2014   Procedure: ESOPHAGOGASTRODUODENOSCOPY (EGD) WITH PROPOFOL;  Surgeon: Beryle Beams, MD;  [facility];  Service: Endoscopy;  Laterality: N/A;  . ESOPHAGOGASTRODUODENOSCOPY (EGD) WITH PROPOFOL N/A 08/24/2019   Procedure: ESOPHAGOGASTRODUODENOSCOPY (EGD) WITH PROPOFOL;  Surgeon: Lavena Bullion, DO;  [facility];  Service: Gastroenterology;  Laterality: N/A;  . NASAL SEPTUM SURGERY  09/2016  . [place] IMPEDANCE STUDY N/A 09/04/2015   Procedure:  [place] IMPEDANCE STUDY;  Surgeon: Carol Ada, MD;  [facility];  Service: Endoscopy;  Laterality: N/A;  . REPLACEMENT TOTAL KNEE Right 71yrs ago  . TOTAL KNEE ARTHROPLASTY Left 11/30/2012   Procedure: LEFT TOTAL KNEE ARTHROPLASTY;  Surgeon: Kerin Salen, MD;  Location: [facility];  Service: Orthopedics;  Laterality: Left;    Family History  Problem Relation Age of Onset  . Arthritis Mother   . Cancer Mother        breast  . Heart disease Mother        died from "heart problems" at age 50, had CAD  . Depression Mother   . Heart disease Father        had CABG  . Kidney disease Father   . Arthritis Sister   . Heart murmur Sister   . Arthritis Brother        "serious back/neck problems"  . Arthritis Sister   . Aneurysm Sister   . Healthy Daughter   . Colon cancer Neg Hx   . Esophageal cancer Neg Hx     Social History   Socioeconomic History  . Marital status: Widowed    Spouse name: Not on file  . Number of children: 1  . Years of education: Not on file  . Highest education level: Some college, no degree  Occupational History  . Occupation: Retired  Tobacco Use  . Smoking status: Never Smoker  . Smokeless tobacco: Never Used  Vaping Use  . Vaping Use: Never used  Substance and Sexual Activity  . Alcohol use: No  . Drug use: No  . Sexual activity: Not Currently    Birth control/protection: Post-menopausal  Other Topics Concern  . 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 alendronate (FOSAMAX) 70 MG tablet Take 1 tablet (70 mg total) by mouth every 7 (seven) days. Take with a full glass of water on an empty stomach. 4 tablet 11  . amLODipine (NORVASC) 5 MG tablet Take 1 tablet (5 mg total) by mouth daily. 30 tablet 0  . aspirin EC 81 MG tablet Take 1 tablet (81 mg total) by mouth daily. Swallow whole. 90 tablet 3  . Aspirin-Salicylamide-Caffeine (BC HEADACHE PO) Take 1 packet by mouth daily as needed (headaches).    Marland Kitchen azelastine (ASTELIN) 0.1 % nasal spray Place 2  sprays into both nostrils 2 (two) times daily. 30 mL 1  . benzonatate (TESSALON) 100 MG capsule Take 1 capsule (100 mg total) by mouth 3 (three) times daily as needed. 30 capsule 0  . cyanocobalamin (,VITAMIN B-12,) 1000 MCG/ML injection 103mcg SQ once weekly for 3 more weeks, then once monthly. 15 mL 0  . dexlansoprazole (DEXILANT) 60 MG capsule TAKE 1 CAPSULE(60 MG) BY MOUTH DAILY 90 capsule 1  . eletriptan (RELPAX) 40 MG tablet TAKE 1 TABLET BY MOUTH AS NEEDED FOR MIGRAINE OR HEADACHE, MAY REPEAT IN 2 HOURS IF HEADACHE PERSISTS OR RECURS 10 tablet 0  . fluticasone (FLONASE) 50 MCG/ACT nasal spray Place 2 sprays into both nostrils daily. 48 g 3  . Lifitegrast (XIIDRA) 5 % SOLN Apply 1 drop to eye 2 (two) times daily.    . methocarbamol (ROBAXIN) 500 MG tablet Take 1 tablet (500 mg total) by mouth at bedtime as needed for muscle spasms. 30 tablet 1  . ondansetron (ZOFRAN ODT) 4 MG disintegrating tablet Take 1 tablet (4 mg total) by mouth every 8 (eight) hours as needed for nausea or vomiting. 20 tablet 0  . Syringe/Needle, Disp, (SYRINGE 3CC/23GX1") 23G X 1" 3 ML MISC Use as instructed 15 each 0  . topiramate (TOPAMAX) 100 MG tablet TAKE 1 TABLET(100 MG) BY MOUTH TWICE DAILY 180 tablet 1  . amoxicillin (AMOXIL) 875 MG tablet Take 1 tablet (875 mg total) by mouth 2 (two) times daily. 14 tablet 0  . azithromycin  (ZITHROMAX) 250 MG tablet Take 2 tablets by mouth on day 1, followed by 1 tablet by mouth daily for 4 days. 6 tablet 0  . nitroGLYCERIN (NITROSTAT) 0.4 MG SL tablet Place 1 tablet (0.4 mg total) under the tongue every 5 (five) minutes as needed. (Patient not taking: Reported on 08/15/2020) 25 tablet 6   No facility-administered medications prior to visit.    Allergies  Allergen Reactions  . Naratriptan     Ineffective    Review of Systems     Objective:    Physical Exam Constitutional:      Appearance: She is well-developed.  HENT:     Right Ear: Ear canal normal.     Left Ear: Tympanic membrane and ear canal normal.     Ears:     Comments: L Serous effusion Neck:     Thyroid: No thyromegaly.  Cardiovascular:     Rate and Rhythm: Normal rate and regular rhythm.     Heart sounds: Normal heart sounds. No murmur heard.   Pulmonary:     Effort: Pulmonary effort is normal. No respiratory distress.     Breath sounds: Normal breath sounds. No wheezing.  Musculoskeletal:     Cervical back: Neck supple.  Skin:    General: Skin is warm and dry.  Neurological:     Mental Status: She is alert and oriented to person, place, and time.  Psychiatric:        Behavior: Behavior normal.        Thought Content: Thought content normal.        Judgment: Judgment normal.     BP 134/89 (BP Location: Right Arm, Patient Position: Sitting, Cuff Size: Small)   Pulse 85   Temp 98.2 F (36.8 C) (Oral)   Resp 16   Wt 143 lb (64.9 kg)   SpO2 100%   BMI 24.55 kg/m  Wt Readings from Last 3 Encounters:  01/02/21 143 lb (64.9 kg)  11/13/20 144 lb (65.3 kg)  08/15/20 135 lb (61.2 kg)    Health Maintenance Due  Topic Date Due  . COVID-19 Vaccine (1) Never done    There are no preventive care reminders to display for this patient.   Lab Results  Component Value Date   TSH 1.81 04/11/2020   Lab Results  Component Value Date   WBC 4.6 07/25/2020   HGB 13.5 07/25/2020   HCT 40.2  07/25/2020   MCV 90 07/25/2020   PLT 354 07/25/2020   Lab Results  Component Value Date   NA 142 07/25/2020   K 4.0 07/25/2020   CO2 22 07/25/2020   GLUCOSE 75 07/25/2020   BUN 15 07/25/2020   CREATININE 1.02 (H) 07/25/2020   BILITOT 0.3 07/04/2020   ALKPHOS 51 04/20/2018   AST 15 07/04/2020   ALT 8 07/04/2020   PROT 6.8 07/04/2020   ALBUMIN 3.7 04/20/2018   CALCIUM 9.3 07/25/2020   ANIONGAP 9 04/20/2018   GFR 74.40 04/11/2020   Lab Results  Component Value Date   CHOL 246 (H) 01/07/2017   Lab Results  Component Value Date   HDL 55.30 01/07/2017   Lab Results  Component Value Date   LDLCALC 173 (H) 01/07/2017   Lab Results  Component Value Date   TRIG 85.0 01/07/2017   Lab Results  Component Value Date   CHOLHDL 4 01/07/2017   Lab Results  Component Value Date   HGBA1C  02/19/2007    5.2 (NOTE)   The ADA recommends the following therapeutic goals for glycemic   control related to Hgb A1C measurement:   Goal of Therapy:   < 7.0% Hgb A1C   Action Suggested:  > 8.0% Hgb A1C   Ref:  Diabetes Care, 22, Suppl. 1, 1999       Assessment & Plan:   Problem List Items Addressed This Visit      Unprioritized   Overactive bladder    Trial of myrbetriq 25mg  once daily. Check urine culture to rule out infection.      Essential hypertension    BP is stable. Continue amlodipine 5mg  once daily.       B12 deficiency    b12 injection today. Will resume monthly b12 injections.       Allergic rhinitis    Reviewed external CT sinus- no obvious sinusitis noted.  We discussed that she should follow up with ENT and let me know if she decides that she would like to see allergist in the future.        Other Visit Diagnoses    Urinary frequency    -  Primary   Relevant Orders   Urine Culture       Meds ordered this encounter  Medications  . mirabegron ER (MYRBETRIQ) 25 MG TB24 tablet    Sig: Take 1 tablet (25 mg total) by mouth daily.    Dispense:  30 tablet     Refill:  2    Order Specific Question:   Supervising Provider    Answer:   Penni Homans A [[id number]]     [name], NP

## 2021-01-02 NOTE — Addendum Note (Signed)
Addended by: Jiles Prows on: 01/02/2021 11:29 AM   Modules accepted: Orders

## 2021-01-02 NOTE — Assessment & Plan Note (Addendum)
Reviewed external CT sinus- no obvious sinusitis noted.  We discussed that she should follow up with ENT and let me know if she decides that she would like to see allergist in the future. Continue flonase, astelin, clarinex 10mg .

## 2021-01-02 NOTE — Assessment & Plan Note (Signed)
b12 injection today. Will resume monthly b12 injections.

## 2021-01-02 NOTE — Patient Instructions (Signed)
Please go to the lab prior to leaving. Start myrbetriq 25mg  once daily for overactive bladder.

## 2021-01-02 NOTE — Assessment & Plan Note (Signed)
Trial of myrbetriq 25mg  once daily. Check urine culture to rule out infection.

## 2021-01-02 NOTE — Assessment & Plan Note (Signed)
BP is stable. Continue amlodipine 5mg  once daily.

## 2021-01-04 LAB — URINE CULTURE
MICRO NUMBER:: 11786737
SPECIMEN QUALITY:: ADEQUATE

## 2021-01-05 ENCOUNTER — Telehealth: Payer: Self-pay | Admitting: Family

## 2021-01-05 MED ORDER — CEPHALEXIN 500 MG PO CAPS: 500.0000 mg | ORAL_CAPSULE | Freq: Three times a day (TID) | ORAL | 0 refills | Status: AC

## 2021-01-05 NOTE — Telephone Encounter (Signed)
Culture shows UTI.  I would like her to start keflex 500mg  tid x 5 days.

## 2021-01-05 NOTE — Telephone Encounter (Signed)
Advised patient of results and new prescription. She verbalized understanding.

## 2021-01-18 DIAGNOSIS — H6505 Acute serous otitis media, recurrent, left ear: Secondary | ICD-10-CM | POA: Diagnosis not present

## 2021-01-25 DIAGNOSIS — H6983 Other specified disorders of Eustachian tube, bilateral: Secondary | ICD-10-CM | POA: Diagnosis not present

## 2021-01-30 ENCOUNTER — Other Ambulatory Visit: Payer: Self-pay

## 2021-01-30 ENCOUNTER — Encounter: Payer: Self-pay | Admitting: Family

## 2021-01-30 ENCOUNTER — Ambulatory Visit (INDEPENDENT_AMBULATORY_CARE_PROVIDER_SITE_OTHER): Payer: Medicare Other | Admitting: Family

## 2021-01-30 DIAGNOSIS — E538 Deficiency of other specified B group vitamins: Secondary | ICD-10-CM

## 2021-01-30 DIAGNOSIS — N3281 Overactive bladder: Secondary | ICD-10-CM | POA: Diagnosis not present

## 2021-01-30 MED ORDER — CYANOCOBALAMIN 1000 MCG/ML IJ SOLN
1000.0000 ug | Freq: Once | INTRAMUSCULAR | Status: AC
  Administered 2021-01-30: 1000 ug via INTRAMUSCULAR

## 2021-01-30 NOTE — Progress Notes (Signed)
Subjective:    Patient ID: Lindsay Frost, female    DOB: 1955/12/26, 65 y.o.   MRN: [id number]  HPI  Patient is a 65 ry old female who presents today for follow up of her overactive bladder. Last visit we recommended trial of myrbetriq 25mg  once daily. Reports decreased frequency of voiding and decreased nocturia. Denies any loss of bladder control.   She saw ENT last week and had a myringotomy placed in her left ear. States that this is helping her symptoms.   Review of Systems See HPI  Past Medical History:  Diagnosis Date  . Allergic rhinitis 07/16/2016  . Angina pectoris ([place]) 07/25/2020  . Arthritis   . Back pain    arthritis  . Depression    takes cymbalta daily  . Duodenal stenosis   . Epigastric pain 07/29/2018  . Essential hypertension 05/14/2019  . Gallstones   . GERD (gastroesophageal reflux disease)    takes Omeprazole daily  . Headache(784.0)    migraines  . Heart murmur   . Hiatal hernia   . History of gastric ulcer   . History of migraine    last one about 2wks ago-takes Imitrex prn  . History of seizures   . Joint pain   . Joint swelling   . 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[place]) 01/13/2015  . Meningioma ([place])   . Migraine without aura and without status migrainosus, not intractable 01/13/2015  . Mixed dyslipidemia 07/25/2020  . Nocturnal leg cramps 02/20/2015  . Obesity (BMI 30-39.9) 07/22/2014  . Osteoarthritis of left knee 12/02/2012  . Peptic ulcer disease   . Physical exam 07/22/2014  . Seizure-like activity ([place]) 05/12/2014  . Ulcer      Social History   Socioeconomic History  . Marital status: Widowed    Spouse name: Not on file  . Number of children: 1  . Years of education: Not on file  . Highest education level: Some college, no degree  Occupational History  . Occupation: Retired  Tobacco Use  . Smoking status: Never Smoker  . Smokeless tobacco: Never Used  Vaping Use  . Vaping Use: Never used   Substance and Sexual Activity  . Alcohol use: No  . Drug use: No  . Sexual activity: Not Currently    Birth control/protection: Post-menopausal  Other Topics Concern  . 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 BALLOON DILATION  04/03/2012   Procedure: BALLOON DILATION;  Surgeon: Beryle Beams, MD;  [facility];  Service: Endoscopy;  Laterality: N/A;  . BIOPSY  08/24/2019   Procedure: BIOPSY;  Surgeon: Lavena Bullion, DO;  [facility];  Service: Gastroenterology;;  EGD and COLON  . BRAVO [place] STUDY N/A 08/24/2019   Procedure: BRAVO [place];  Surgeon: Lavena Bullion, DO;  [facility];  Service: Gastroenterology;  Laterality: N/A;  . CHOLECYSTECTOMY N/A 04/24/2018   Procedure: LAPAROSCOPIC CHOLECYSTECTOMY;  Surgeon: Clovis Riley, MD;  [facility];  Service: General;  Laterality: N/A;  . COLONOSCOPY    . COLONOSCOPY WITH PROPOFOL N/A 08/24/2019   Procedure: COLONOSCOPY WITH PROPOFOL;  Surgeon: Lavena Bullion, DO;  [facility];  Service: Gastroenterology;  Laterality: N/A;  . DIAGNOSTIC LAPAROSCOPY  6yrs ago  . ESOPHAGEAL MANOMETRY N/A 09/04/2015   Procedure: ESOPHAGEAL MANOMETRY (EM);  Surgeon: Carol Ada, MD;  [facility];  Service: Endoscopy;  Laterality: N/A;  . ESOPHAGOGASTRODUODENOSCOPY  04/03/2012   Procedure: ESOPHAGOGASTRODUODENOSCOPY (EGD);  Surgeon:  Beryle Beams, MD;  [facility];  Service: Endoscopy;  Laterality: N/A;  EGD w/ balloon dilation  . ESOPHAGOGASTRODUODENOSCOPY (EGD) WITH PROPOFOL N/A 02/18/2014   Procedure: ESOPHAGOGASTRODUODENOSCOPY (EGD) WITH PROPOFOL;  Surgeon: Beryle Beams, MD;  [facility];  Service: Endoscopy;  Laterality: N/A;  . ESOPHAGOGASTRODUODENOSCOPY (EGD) WITH PROPOFOL N/A 04/22/2014   Procedure: ESOPHAGOGASTRODUODENOSCOPY (EGD) WITH PROPOFOL;  Surgeon: Beryle Beams, MD;  [facility];  Service: Endoscopy;  Laterality: N/A;  . ESOPHAGOGASTRODUODENOSCOPY (EGD) WITH PROPOFOL N/A 08/24/2019   Procedure: ESOPHAGOGASTRODUODENOSCOPY (EGD) WITH PROPOFOL;  Surgeon: Lavena Bullion, DO;  [facility];  Service: Gastroenterology;  Laterality: N/A;  . NASAL SEPTUM SURGERY  09/2016  . [place] IMPEDANCE STUDY N/A 09/04/2015   Procedure: [place] IMPEDANCE STUDY;  Surgeon: Carol Ada, MD;  [facility];  Service: Endoscopy;  Laterality: N/A;  . REPLACEMENT TOTAL KNEE Right 56yrs ago  . TOTAL KNEE ARTHROPLASTY Left 11/30/2012   Procedure: LEFT TOTAL KNEE ARTHROPLASTY;  Surgeon: Kerin Salen, MD;  Location: [facility];  Service: Orthopedics;  Laterality: Left;    Family History  Problem Relation Age of Onset  . Arthritis Mother   . Cancer Mother        breast  . Heart disease Mother        died from "heart problems" at age 14, had CAD  . Depression Mother   . Heart disease Father        had CABG  . Kidney disease Father   . Arthritis Sister   . Heart murmur Sister   . Arthritis Brother        "serious back/neck problems"  . Arthritis Sister   . Aneurysm Sister   . Healthy Daughter   . Colon cancer Neg Hx   . Esophageal cancer Neg Hx     Allergies  Allergen Reactions  . Naratriptan     Ineffective    Current Outpatient Medications on File Prior to Visit  Medication Sig Dispense Refill  . alendronate (FOSAMAX) 70 MG tablet Take 1 tablet (70 mg total) by mouth every 7  (seven) days. Take with a full glass of water on an empty stomach. 4 tablet 11  . amLODipine (NORVASC) 5 MG tablet Take 1 tablet (5 mg total) by mouth daily. 30 tablet 0  . aspirin EC 81 MG tablet Take 1 tablet (81 mg total) by mouth daily. Swallow whole. 90 tablet 3  . Aspirin-Salicylamide-Caffeine (BC HEADACHE PO) Take 1 packet by mouth daily as needed (headaches).    Marland Kitchen azelastine (ASTELIN) 0.1 % nasal spray Place 2 sprays into both nostrils 2 (two) times daily. 30 mL 1  . benzonatate (TESSALON) 100 MG capsule Take 1 capsule (100 mg total) by mouth 3 (three) times daily as needed. 30 capsule 0  . cyanocobalamin (,VITAMIN B-12,) 1000 MCG/ML injection  1017mcg SQ once weekly for 3 more weeks, then once monthly. 15 mL 0  . dexlansoprazole (DEXILANT) 60 MG capsule TAKE 1 CAPSULE(60 MG) BY MOUTH DAILY 90 capsule 1  . eletriptan (RELPAX) 40 MG tablet TAKE 1 TABLET BY MOUTH AS NEEDED FOR MIGRAINE OR HEADACHE, MAY REPEAT IN 2 HOURS IF HEADACHE PERSISTS OR RECURS 10 tablet 0  . fluticasone (FLONASE) 50 MCG/ACT nasal spray Place 2 sprays into both nostrils daily. 48 g 3  . Lifitegrast (XIIDRA) 5 % SOLN Apply 1 drop to eye 2 (two) times daily.    . methocarbamol (ROBAXIN) 500 MG tablet Take 1 tablet (500 mg total) by mouth at bedtime as needed for muscle spasms. 30 tablet 1  . mirabegron ER (MYRBETRIQ) 25 MG TB24 tablet Take 1 tablet (25 mg total) by mouth daily. 30 tablet 2  . ondansetron (ZOFRAN ODT) 4 MG disintegrating tablet Take 1 tablet (4 mg total) by mouth every 8 (eight) hours as needed for nausea or vomiting. 20 tablet 0  . Syringe/Needle, Disp, (SYRINGE 3CC/23GX1") 23G X 1" 3 ML MISC Use as instructed 15 each 0  . topiramate (TOPAMAX) 100 MG tablet TAKE 1 TABLET(100 MG) BY MOUTH TWICE DAILY 180 tablet 1  . nitroGLYCERIN (NITROSTAT) 0.4 MG SL tablet Place 1 tablet (0.4 mg total) under the tongue every 5 (five) minutes as needed. (Patient not taking: Reported on 08/15/2020) 25 tablet 6   No current  facility-administered medications on file prior to visit.    BP 128/78 (BP Location: Right Arm, Patient Position: Sitting, Cuff Size: Small)   Pulse 85   Temp 98.5 F (36.9 C) (Oral)   Resp (!) 46   Ht 5\' 4"  (1.626 m)   Wt 144 lb (65.3 kg)   SpO2 99%   BMI 24.72 kg/m       Objective:   Physical Exam Constitutional:      Appearance: She is well-developed.  Neck:     Thyroid: No thyromegaly.  Cardiovascular:     Rate and Rhythm: Normal rate and regular rhythm.     Heart sounds: Normal heart sounds. No murmur heard.   Pulmonary:     Effort: Pulmonary effort is normal. No respiratory distress.     Breath sounds: Normal breath sounds. No wheezing.  Musculoskeletal:     Cervical back: Neck supple.  Skin:    General: Skin is warm and dry.  Neurological:     Mental Status: She is alert and oriented to person, place, and time.  Psychiatric:        Behavior: Behavior normal.        Thought Content: Thought content normal.        Judgment: Judgment normal.           Assessment & Plan:

## 2021-01-30 NOTE — Assessment & Plan Note (Signed)
b12 injection today.  

## 2021-01-30 NOTE — Assessment & Plan Note (Signed)
Stable/improved on Myrbetriq 25mg  once daily. Continue current dose.

## 2021-02-01 ENCOUNTER — Ambulatory Visit: Payer: Medicare Other

## 2021-02-16 ENCOUNTER — Other Ambulatory Visit: Payer: Self-pay | Admitting: Family

## 2021-02-16 ENCOUNTER — Ambulatory Visit (INDEPENDENT_AMBULATORY_CARE_PROVIDER_SITE_OTHER): Payer: Medicare Other | Admitting: Family

## 2021-02-16 ENCOUNTER — Other Ambulatory Visit: Payer: Self-pay

## 2021-02-16 ENCOUNTER — Ambulatory Visit (HOSPITAL_BASED_OUTPATIENT_CLINIC_OR_DEPARTMENT_OTHER)
Admission: RE | Admit: 2021-02-16 | Discharge: 2021-02-16 | Disposition: A | Discharge status: Home / Self Care | Payer: Medicare Other | Source: Ambulatory Visit | Attending: Family | Admitting: Family

## 2021-02-16 VITALS — BP 149/75 | HR 82 | Temp 98.6°F | Resp 16 | Wt 141.0 lb

## 2021-02-16 DIAGNOSIS — M546 Pain in thoracic spine: Secondary | ICD-10-CM | POA: Insufficient documentation

## 2021-02-16 DIAGNOSIS — R110 Nausea: Secondary | ICD-10-CM | POA: Insufficient documentation

## 2021-02-16 DIAGNOSIS — R1084 Generalized abdominal pain: Secondary | ICD-10-CM | POA: Diagnosis not present

## 2021-02-16 DIAGNOSIS — R197 Diarrhea, unspecified: Secondary | ICD-10-CM | POA: Diagnosis not present

## 2021-02-16 DIAGNOSIS — Z9049 Acquired absence of other specified parts of digestive tract: Secondary | ICD-10-CM | POA: Diagnosis not present

## 2021-02-16 HISTORY — DX: Nausea: R11.0

## 2021-02-16 HISTORY — DX: Pain in thoracic spine: M54.6

## 2021-02-16 HISTORY — DX: Diarrhea, unspecified: R19.7

## 2021-02-16 HISTORY — DX: Generalized abdominal pain: R10.84

## 2021-02-16 MED ORDER — ONDANSETRON HCL 4 MG PO TABS: 4.0000 mg | ORAL_TABLET | Freq: Three times a day (TID) | ORAL | 0 refills | Status: DC | PRN

## 2021-02-16 NOTE — Assessment & Plan Note (Signed)
Will obtain x-ray of the thoracic spine.

## 2021-02-16 NOTE — Assessment & Plan Note (Signed)
Rx provided for zofran 4mg .

## 2021-02-16 NOTE — Progress Notes (Signed)
Subjective:   By signing my name below, I, Shehryar Baig, attest that this documentation has been prepared under the direction and in the presence of Debbrah Alar NP. 02/16/2021      Patient ID: Lindsay Frost, female    DOB: 1956/07/30, 65 y.o.   MRN: [id number]  No chief complaint on file.   HPI Patient is in today for a office visit. She complains of abdominal pain and 3/10 lower back pain for the past week and a half. She also has diarrhea, urinary frequency, and nausea. She takes imodium to manage the stomach pain but finds no relief. She takes OTC ibuprofen and tylenol but finds no relief. She found relief from using the heating pad. She was drinking increased water while having diarrhea. The pain initially resolved but returned 2 days later. She had a bladder infection 1 month ago. She has a history of compression fracture at t12 and prior cholecystectomy. Her surgeon recommended a fundoplication procedure to manage her hiatal hernia but she would prefer not having surgery. She is requesting medication to manage her nausea.   Health Maintenance Due  Topic Date Due  . COVID-19 Vaccine (1) Never done  . Zoster Vaccines- Shingrix (1 of 2) Never done    Past Medical History:  Diagnosis Date  . Allergic rhinitis 07/16/2016  . Angina pectoris ([place]) 07/25/2020  . Arthritis   . Back pain    arthritis  . Depression    takes cymbalta daily  . Duodenal stenosis   . Epigastric pain 07/29/2018  . Essential hypertension 05/14/2019  . Gallstones   . GERD (gastroesophageal reflux disease)    takes Omeprazole daily  . Headache(784.0)    migraines  . Heart murmur   . Hiatal hernia   . History of gastric ulcer   . History of migraine    last one about 2wks ago-takes Imitrex prn  . History of seizures   . Joint pain   . Joint swelling   . 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[place]) 01/13/2015  . Meningioma ([place])   . Migraine without aura and  without status migrainosus, not intractable 01/13/2015  . Mixed dyslipidemia 07/25/2020  . Nocturnal leg cramps 02/20/2015  . Obesity (BMI 30-39.9) 07/22/2014  . Osteoarthritis of left knee 12/02/2012  . Peptic ulcer disease   . Physical exam 07/22/2014  . Seizure-like activity ([place]) 05/12/2014  . Ulcer     Past Surgical History:  Procedure Laterality Date  . BALLOON DILATION  04/03/2012   Procedure: BALLOON DILATION;  Surgeon: Beryle Beams, MD;  Location: [facility];  Service: Endoscopy;  Laterality: N/A;  . BIOPSY  08/24/2019   Procedure: BIOPSY;  Surgeon: Lavena Bullion, DO;  Location: [facility];  Service: Gastroenterology;;  EGD and COLON  . BRAVO [place] STUDY N/A 08/24/2019   Procedure: BRAVO [place];  Surgeon: Lavena Bullion, DO;  Location: [facility];  Service: Gastroenterology;  Laterality: N/A;  . CHOLECYSTECTOMY N/A 04/24/2018   Procedure: LAPAROSCOPIC CHOLECYSTECTOMY;  Surgeon: Clovis Riley, MD;  Location: [facility];  Service: General;  Laterality: N/A;  . COLONOSCOPY    . COLONOSCOPY WITH PROPOFOL N/A 08/24/2019   Procedure: COLONOSCOPY WITH PROPOFOL;  Surgeon: Lavena Bullion, DO;  [facility]: [facility];  Service: Gastroenterology;  Laterality: N/A;  . DIAGNOSTIC LAPAROSCOPY  59yrs ago  . ESOPHAGEAL MANOMETRY N/A 09/04/2015   Procedure: ESOPHAGEAL MANOMETRY (EM);  Surgeon: Carol Ada, MD;  [facility]: [facility];  Service: Endoscopy;  Laterality:  N/A;  . ESOPHAGOGASTRODUODENOSCOPY  04/03/2012   Procedure: ESOPHAGOGASTRODUODENOSCOPY (EGD);  Surgeon: Beryle Beams, MD;  Location: [facility];  Service: Endoscopy;  Laterality: N/A;  EGD w/ balloon dilation  . ESOPHAGOGASTRODUODENOSCOPY (EGD) WITH PROPOFOL N/A 02/18/2014   Procedure: ESOPHAGOGASTRODUODENOSCOPY (EGD) WITH PROPOFOL;  Surgeon: Beryle Beams, MD;  [facility];  Service: Endoscopy;  Laterality: N/A;  . ESOPHAGOGASTRODUODENOSCOPY (EGD) WITH PROPOFOL N/A 04/22/2014   Procedure:  ESOPHAGOGASTRODUODENOSCOPY (EGD) WITH PROPOFOL;  Surgeon: Beryle Beams, MD;  [facility];  Service: Endoscopy;  Laterality: N/A;  . ESOPHAGOGASTRODUODENOSCOPY (EGD) WITH PROPOFOL N/A 08/24/2019   Procedure: ESOPHAGOGASTRODUODENOSCOPY (EGD) WITH PROPOFOL;  Surgeon: Lavena Bullion, DO;  [facility];  Service: Gastroenterology;  Laterality: N/A;  . NASAL SEPTUM SURGERY  09/2016  . [place] IMPEDANCE STUDY N/A 09/04/2015   Procedure: [place] IMPEDANCE STUDY;  Surgeon: Carol Ada, MD;  Location: [facility];  Service: Endoscopy;  Laterality: N/A;  . REPLACEMENT TOTAL KNEE Right 46yrs ago  . TOTAL KNEE ARTHROPLASTY Left 11/30/2012   Procedure: LEFT TOTAL KNEE ARTHROPLASTY;  Surgeon: Kerin Salen, MD;  Location: [facility];  Service: Orthopedics;  Laterality: Left;    Family History  Problem Relation Age of Onset  . Arthritis Mother   . Cancer Mother        breast  . Heart disease Mother        died from "heart problems" at age 54, had CAD  . Depression Mother   . Heart disease Father        had CABG  . Kidney disease Father   . Arthritis Sister   . Heart murmur Sister   . Arthritis Brother        "serious back/neck problems"  . Arthritis Sister   . Aneurysm Sister   . Healthy Daughter   . Colon cancer Neg Hx   . Esophageal cancer Neg Hx     Social History   Socioeconomic History  . Marital status: Widowed    Spouse name: Not on file  . Number of children: 1  . Years of education: Not on file  . Highest education level: Some college, no degree  Occupational History  . Occupation: Retired  Tobacco Use  . Smoking status: Never Smoker  . Smokeless tobacco: Never Used  Vaping Use  . Vaping Use: Never used  Substance and Sexual Activity  . Alcohol use: No  . Drug use: No  . Sexual activity: Not Currently    Birth control/protection: Post-menopausal  Other Topics Concern  . 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 alendronate (FOSAMAX) 70 MG tablet Take 1 tablet (70 mg total) by mouth every 7 (seven) days. Take with a full glass of water on an empty stomach. 4 tablet 11  . amLODipine (NORVASC) 5 MG tablet Take  1 tablet (5 mg total) by mouth daily. 30 tablet 0  . aspirin EC 81 MG tablet Take 1 tablet (81 mg total) by mouth daily. Swallow whole. 90 tablet 3  . Aspirin-Salicylamide-Caffeine (BC HEADACHE PO) Take 1 packet by mouth daily as needed (headaches).    Mar[place] Kitchen azelastine (ASTELIN) 0.1 % nasal spray Place 2 sprays into both nostrils 2 (two) times daily. 30 mL 1  . benzonatate (TESSALON) 100 MG capsule Take 1 capsule (100 mg total) by mouth 3 (three) times daily as needed. 30 capsule 0  . cyanocobalamin (,VITAMIN B-12,) 1000 MCG/ML injection 1046mcg SQ once weekly for 3 more weeks, then once monthly. 15 mL 0  . dexlansoprazole (DEXILANT) 60 MG capsule TAKE 1 CAPSULE(60 MG) BY MOUTH DAILY 90 capsule 1  . eletriptan (RELPAX) 40 MG tablet TAKE 1 TABLET BY MOUTH AS NEEDED FOR MIGRAINE OR HEADACHE, MAY REPEAT IN 2 HOURS IF HEADACHE PERSISTS OR RECURS 10 tablet 0  . fluticasone (FLONASE) 50 MCG/ACT nasal spray Place 2 sprays into both nostrils daily. 48 g 3  . Lifitegrast (XIIDRA) 5 % SOLN Apply 1 drop to eye 2 (two) times daily.    . methocarbamol (ROBAXIN) 500 MG tablet Take 1 tablet (500 mg total) by mouth at bedtime as needed for muscle spasms. 30 tablet 1  . mirabegron ER  (MYRBETRIQ) 25 MG TB24 tablet Take 1 tablet (25 mg total) by mouth daily. 30 tablet 2  . nitroGLYCERIN (NITROSTAT) 0.4 MG SL tablet Place 1 tablet (0.4 mg total) under the tongue every 5 (five) minutes as needed. (Patient not taking: Reported on 08/15/2020) 25 tablet 6  . ondansetron (ZOFRAN ODT) 4 MG disintegrating tablet Take 1 tablet (4 mg total) by mouth every 8 (eight) hours as needed for nausea or vomiting. 20 tablet 0  . Syringe/Needle, Disp, (SYRINGE 3CC/23GX1") 23G X 1" 3 ML MISC Use as instructed 15 each 0  . topiramate (TOPAMAX) 100 MG tablet TAKE 1 TABLET(100 MG) BY MOUTH TWICE DAILY 180 tablet 1   No facility-administered medications prior to visit.    Allergies  Allergen Reactions  . Naratriptan     Ineffective    Review of Systems  Gastrointestinal: Positive for abdominal pain, diarrhea and nausea.  Genitourinary: Positive for frequency.  Musculoskeletal: Positive for back pain.       Objective:    Physical Exam Constitutional:      General: She is not in acute distress.    Appearance: Normal appearance. She is not ill-appearing.  HENT:     Head: Normocephalic and atraumatic.     Right Ear: External ear normal.     Left Ear: External ear normal.  Eyes:     Extraocular Movements: Extraocular movements intact.     Pupils: Pupils are equal, round, and reactive to light.  Cardiovascular:     Rate and Rhythm: Regular rhythm.     Pulses: Normal pulses.     Heart sounds: Normal heart sounds. No murmur heard. No gallop.   Pulmonary:     Effort: Pulmonary effort is normal. No respiratory distress.     Breath sounds: Normal breath sounds. No wheezing, rhonchi or rales.  Abdominal:     General: Bowel sounds are normal.     Tenderness: There is abdominal tenderness. There is guarding. There is no right CVA tenderness or left CVA tenderness.  Musculoskeletal:     Thoracic back: Tenderness (Tenderness to palpation on lower thoracic) present.  Skin:    General: Skin  is  warm and dry.  Neurological:     Mental Status: She is alert and oriented to person, place, and time.  Psychiatric:        Behavior: Behavior normal.     There were no vitals taken for this visit. Wt Readings from Last 3 Encounters:  01/30/21 144 lb (65.3 kg)  01/02/21 143 lb (64.9 kg)  11/13/20 144 lb (65.3 kg)       Assessment & Plan:   Problem List Items Addressed This Visit   None      No orders of the defined types were placed in this encounter.   I, Debbrah Alar NP, personally preformed the services described in this documentation.  All medical record entries made by the scribe were at my direction and in my presence.  I have reviewed the chart and discharge instructions (if applicable) and agree that the record reflects my personal performance and is accurate and complete. 02/16/2021   I,Shehryar Baig,acting as a Education administrator for [name], NP.,have documented all relevant documentation on the behalf of [name], NP,as directed by  [name], NP while in the presence of [name], NP.   Shehryar [employer]

## 2021-02-16 NOTE — Assessment & Plan Note (Signed)
New. Check cBC, CMET, lipase and refer back to GI for further evaluation.  Will also obtain UA/Culture to rule out UTI.

## 2021-02-16 NOTE — Patient Instructions (Signed)
Please complete lab work prior to leaving.   

## 2021-02-16 NOTE — Assessment & Plan Note (Signed)
Will obtain stool profile to rule out infectious etiology.

## 2021-02-17 LAB — CBC WITH DIFFERENTIAL/PLATELET
Absolute Monocytes: 510 cells/uL (ref 200–950)
Basophils Absolute: 152 cells/uL (ref 0–200)
Basophils Relative: 3.1 %
Eosinophils Absolute: 78 cells/uL (ref 15–500)
Eosinophils Relative: 1.6 %
HCT: 42.2 % (ref 35.0–45.0)
Hemoglobin: 13.9 g/dL (ref 11.7–15.5)
Lymphs Abs: 1852 cells/uL (ref 850–3900)
MCH: 30.2 pg (ref 27.0–33.0)
MCHC: 32.9 g/dL (ref 32.0–36.0)
MCV: 91.7 fL (ref 80.0–100.0)
MPV: 11.3 fL (ref 7.5–12.5)
Monocytes Relative: 10.4 %
Neutro Abs: 2308 cells/uL (ref 1500–7800)
Neutrophils Relative %: 47.1 %
Platelets: 350 10*3/uL (ref 140–400)
RBC: 4.6 10*6/uL (ref 3.80–5.10)
RDW: 12.8 % (ref 11.0–15.0)
Total Lymphocyte: 37.8 %
WBC: 4.9 10*3/uL (ref 3.8–10.8)

## 2021-02-17 LAB — COMPREHENSIVE METABOLIC PANEL
AG Ratio: 1.7 (calc) (ref 1.0–2.5)
ALT: 11 U/L (ref 6–29)
AST: 17 U/L (ref 10–35)
Albumin: 4.4 g/dL (ref 3.6–5.1)
Alkaline phosphatase (APISO): 86 U/L (ref 37–153)
BUN/Creatinine Ratio: 9 (calc) (ref 6–22)
BUN: 10 mg/dL (ref 7–25)
CO2: 22 mmol/L (ref 20–32)
Calcium: 9.4 mg/dL (ref 8.6–10.4)
Chloride: 106 mmol/L (ref 98–110)
Creat: 1.08 mg/dL — ABNORMAL HIGH (ref 0.50–0.99)
Globulin: 2.6 g/dL (calc) (ref 1.9–3.7)
Glucose, Bld: 89 mg/dL (ref 65–99)
Potassium: 4.1 mmol/L (ref 3.5–5.3)
Sodium: 140 mmol/L (ref 135–146)
Total Bilirubin: 0.4 mg/dL (ref 0.2–1.2)
Total Protein: 7 g/dL (ref 6.1–8.1)

## 2021-02-17 LAB — URINALYSIS, ROUTINE W REFLEX MICROSCOPIC
Bilirubin Urine: NEGATIVE
Glucose, UA: NEGATIVE
Hgb urine dipstick: NEGATIVE
Ketones, ur: NEGATIVE
Leukocytes,Ua: NEGATIVE
Nitrite: NEGATIVE
Protein, ur: NEGATIVE
Specific Gravity, Urine: 1.023 (ref 1.001–1.035)
pH: 5.5 (ref 5.0–8.0)

## 2021-02-17 LAB — URINE CULTURE
MICRO NUMBER:: 11966489
SPECIMEN QUALITY:: ADEQUATE

## 2021-02-17 LAB — LIPASE: Lipase: 24 U/L (ref 7–60)

## 2021-02-20 DIAGNOSIS — H25013 Cortical age-related cataract, bilateral: Secondary | ICD-10-CM | POA: Diagnosis not present

## 2021-02-20 DIAGNOSIS — H18413 Arcus senilis, bilateral: Secondary | ICD-10-CM | POA: Diagnosis not present

## 2021-02-20 DIAGNOSIS — H2513 Age-related nuclear cataract, bilateral: Secondary | ICD-10-CM | POA: Diagnosis not present

## 2021-02-20 DIAGNOSIS — H2511 Age-related nuclear cataract, right eye: Secondary | ICD-10-CM | POA: Diagnosis not present

## 2021-02-20 DIAGNOSIS — H25043 Posterior subcapsular polar age-related cataract, bilateral: Secondary | ICD-10-CM | POA: Diagnosis not present

## 2021-02-27 ENCOUNTER — Ambulatory Visit: Payer: Medicare Other

## 2021-02-27 DIAGNOSIS — Z9622 Myringotomy tube(s) status: Secondary | ICD-10-CM | POA: Insufficient documentation

## 2021-02-27 DIAGNOSIS — H6505 Acute serous otitis media, recurrent, left ear: Secondary | ICD-10-CM | POA: Diagnosis not present

## 2021-02-27 HISTORY — DX: Myringotomy tube(s) status: Z96.22

## 2021-03-02 ENCOUNTER — Other Ambulatory Visit: Payer: Self-pay

## 2021-03-02 ENCOUNTER — Other Ambulatory Visit: Payer: Medicare Other

## 2021-03-02 DIAGNOSIS — R197 Diarrhea, unspecified: Secondary | ICD-10-CM | POA: Diagnosis not present

## 2021-03-02 DIAGNOSIS — R1084 Generalized abdominal pain: Secondary | ICD-10-CM | POA: Diagnosis not present

## 2021-03-02 NOTE — Addendum Note (Signed)
Addended by: Manuela Schwartz on: 03/02/2021 09:45 AM   Modules accepted: Orders

## 2021-03-03 ENCOUNTER — Other Ambulatory Visit: Payer: Self-pay | Admitting: Family

## 2021-03-06 ENCOUNTER — Encounter: Payer: Self-pay | Admitting: Family

## 2021-03-06 LAB — GI PROFILE, STOOL, PCR

## 2021-03-16 DIAGNOSIS — H2511 Age-related nuclear cataract, right eye: Secondary | ICD-10-CM | POA: Diagnosis not present

## 2021-03-16 DIAGNOSIS — H2512 Age-related nuclear cataract, left eye: Secondary | ICD-10-CM | POA: Diagnosis not present

## 2021-03-30 DIAGNOSIS — H2512 Age-related nuclear cataract, left eye: Secondary | ICD-10-CM | POA: Diagnosis not present

## 2021-04-05 ENCOUNTER — Other Ambulatory Visit: Payer: Self-pay | Admitting: Family

## 2021-04-05 DIAGNOSIS — Z8711 Personal history of peptic ulcer disease: Secondary | ICD-10-CM

## 2021-04-05 DIAGNOSIS — K219 Gastro-esophageal reflux disease without esophagitis: Secondary | ICD-10-CM

## 2021-04-13 ENCOUNTER — Other Ambulatory Visit: Payer: Self-pay

## 2021-04-13 ENCOUNTER — Ambulatory Visit (INDEPENDENT_AMBULATORY_CARE_PROVIDER_SITE_OTHER): Payer: Medicare Other | Admitting: Cardiology

## 2021-04-13 VITALS — BP 122/74 | HR 88 | Ht 64.0 in | Wt 143.0 lb

## 2021-04-13 DIAGNOSIS — I209 Angina pectoris, unspecified: Secondary | ICD-10-CM | POA: Diagnosis not present

## 2021-04-13 DIAGNOSIS — I10 Essential (primary) hypertension: Secondary | ICD-10-CM

## 2021-04-13 DIAGNOSIS — R519 Headache, unspecified: Secondary | ICD-10-CM | POA: Insufficient documentation

## 2021-04-13 DIAGNOSIS — E782 Mixed hyperlipidemia: Secondary | ICD-10-CM | POA: Diagnosis not present

## 2021-04-13 NOTE — Patient Instructions (Signed)
Medication Instructions:  Your physician has recommended you make the following change in your medication:   Take 81 mg coated aspirin daily. Use nitroglycerin as needed for chest pain.  *If you need a refill on your cardiac medications before your next appointment, please call your pharmacy*   Lab Work: Your physician recommends that you have a BMET and CBC today in the office for your upcoming procedure.  If you have labs (blood work) drawn today and your tests are completely normal, you will receive your results only by: [place] (if you have MyChart) OR A paper copy in the mail If you have any lab test that is abnormal or we need to change your treatment, we will call you to review the results.   Testing/Procedures:  [place], [place] [address] Dept: [phone] Loc: [phone]  FRUMA VIEWEG  04/13/2021  You are scheduled for a Cardiac Catheterization on Wednesday, August 3 with Dr. Glenetta Hew.  1. Please arrive at the [facility] (Main Entrance A) at [facility]: [address] [place], [place] [address] at 11:00 AM (This time is two hours before your procedure to ensure your preparation). Free valet parking service is available.   Special note: Every effort is made to have your procedure done on time. Please understand that emergencies sometimes delay scheduled procedures.  2. Diet: Do not eat solid foods after midnight.  The patient may have clear liquids until 5am upon the day of the procedure.  3. Labs: You had your labs done in the office.  4. Medication instructions in preparation for your procedure:   Contrast Allergy: No    On the morning of your procedure, take your Aspirin and any morning medicines NOT listed above.  You may use sips of water.  5. Plan for one night stay--bring personal belongings. 6. Bring a current  list of your medications and current insurance cards. 7. You MUST have a responsible person to drive you home. 8. Someone MUST be with you the first 24 hours after you arrive home or your discharge will be delayed. 9. Please wear clothes that are easy to get on and off and wear slip-on shoes.  Thank you for allowing [place] to care for you!   -- [facility]    Follow-Up: At [facility], you and your health needs are our priority.  As part of our continuing mission to provide you with exceptional heart care, we have created designated Provider Care Teams.  These Care Teams include your primary Cardiologist (physician) and Advanced Practice Providers (APPs -  Physician Assistants and Nurse Practitioners) who all work together to provide you with the care you need, when you need it.  We recommend signing up for the patient portal called "MyChart".  Sign up information is provided on this After Visit Summary.  MyChart is used to connect with patients for Virtual Visits (Telemedicine).  Patients are able to view lab/test results, encounter notes, upcoming appointments, etc.  Non-urgent messages can be sent to your provider as well.   To learn more about what you can do with MyChart, go to [email or web address].    Your next appointment:   1 month(s)  The format for your next appointment:   In Person  Provider:   Jyl Heinz, MD   Other Instructions  Coronary Angiogram With Stent Coronary angiogram with stent placement is a procedure  to widen or open a narrow blood vessel of the heart (coronary artery). Arteries may become blocked by cholesterol buildup (plaques) in the lining of the artery wall. When a coronary artery becomes partially blocked, blood flow to that area decreases. This may lead to chest pain or a heart attack (myocardial infarction). A stent is a small piece of metal that looks like mesh or spring. Stent placement may be done as treatment  after a heart attack, or to prevent a heart attack if a blocked artery is found by a coronary angiogram. Let your health care provider know about: Any allergies you have, including allergies to medicines or contrast dye. All medicines you are taking, including vitamins, herbs, eye drops, creams, and over-the-counter medicines. Any problems you or family members have had with anesthetic medicines. Any blood disorders you have. Any surgeries you have had. Any medical conditions you have, including kidney problems or kidney failure. Whether you are pregnant or may be pregnant. Whether you are breastfeeding. What are the risks? Generally, this is a safe procedure. However, serious problems may occur, including: Damage to nearby structures or organs, such as the heart, blood vessels, or kidneys. A return of blockage. Bleeding, infection, or bruising at the insertion site. A collection of blood under the skin (hematoma) at the insertion site. A blood clot in another part of the body. Allergic reaction to medicines or dyes. Bleeding into the abdomen (retroperitoneal bleeding). Stroke (rare). Heart attack (rare). What happens before the procedure? Staying hydrated Follow instructions from your health care provider about hydration, which may include: Up to 2 hours before the procedure - you may continue to drink clear liquids, such as water, clear fruit juice, black coffee, and plain tea.    Eating and drinking restrictions Follow instructions from your health care provider about eating and drinking, which may include: 8 hours before the procedure - stop eating heavy meals or foods, such as meat, fried foods, or fatty foods. 6 hours before the procedure - stop eating light meals or foods, such as toast or cereal. 2 hours before the procedure - stop drinking clear liquids. Medicines Ask your health care provider about: Changing or stopping your regular medicines. This is especially important  if you are taking diabetes medicines or blood thinners. Taking medicines such as aspirin and ibuprofen. These medicines can thin your blood. Do not take these medicines unless your health care provider tells you to take them. Generally, aspirin is recommended before a thin tube, called a catheter, is passed through a blood vessel and inserted into the heart (cardiac catheterization). Taking over-the-counter medicines, vitamins, herbs, and supplements. General instructions Do not use any products that contain nicotine or tobacco for at least 4 weeks before the procedure. These products include cigarettes, e-cigarettes, and chewing tobacco. If you need help quitting, ask your health care provider. Plan to have someone take you home from the hospital or clinic. If you will be going home right after the procedure, plan to have someone with you for 24 hours. You may have tests and imaging procedures. Ask your health care provider: How your insertion site will be marked. Ask which artery will be used for the procedure. What steps will be taken to help prevent infection. These may include: Removing hair at the insertion site. Washing skin with a germ-killing soap. Taking antibiotic medicine. What happens during the procedure? An IV will be inserted into one of your veins. Electrodes may be placed on your chest to monitor your heart  rate during the procedure. You will be given one or more of the following: A medicine to help you relax (sedative). A medicine to numb the area (local anesthetic) for catheter insertion. A small incision will be made for catheter insertion. The catheter will be inserted into an artery using a guide wire. The location may be in your groin, your wrist, or the fold of your arm (near your elbow). An X-ray procedure (fluoroscopy) will be used to help guide the catheter to the opening of the heart arteries. A dye will be injected into the catheter. X-rays will be taken. The dye  helps to show where any narrowing or blockages are located in the arteries. Tell your health care provider if you have chest pain or trouble breathing. A tiny wire will be guided to the blocked spot, and a balloon will be inflated to make the artery wider. The stent will be expanded to crush the plaques into the wall of the vessel. The stent will hold the area open and improve the blood flow. Most stents have a drug coating to reduce the risk of the stent narrowing over time. The artery may be made wider using a drill, laser, or other tools that remove plaques. The catheter will be removed when the blood flow improves. The stent will stay where it was placed, and the lining of the artery will grow over it. A bandage (dressing) will be placed on the insertion site. Pressure will be applied to stop bleeding. The IV will be removed. This procedure may vary among health care providers and hospitals.    What happens after the procedure? Your blood pressure, heart rate, breathing rate, and blood oxygen level will be monitored until you leave the hospital or clinic. If the procedure is done through the leg, you will lie flat in bed for a few hours or for as long as told by your health care provider. You will be instructed not to bend or cross your legs. The insertion site and the pulse in your foot or wrist will be checked often. You may have more blood tests, X-rays, and a test that records the electrical activity of your heart (electrocardiogram, or ECG). Do not drive for 24 hours if you were given a sedative during your procedure. Summary Coronary angiogram with stent placement is a procedure to widen or open a narrowed coronary artery. This is done to treat heart problems. Before the procedure, let your health care provider know about all the medical conditions and surgeries you have or have had. This is a safe procedure. However, some problems may occur, including damage to nearby structures or  organs, bleeding, blood clots, or allergies. Follow your health care provider's instructions about eating, drinking, medicines, and other lifestyle changes, such as quitting tobacco use before the procedure. This information is not intended to replace advice given to you by your health care provider. Make sure you discuss any questions you have with your health care provider. Document Revised: 03/24/2019 Document Reviewed: 03/24/2019 Elsevier Patient Education  2021 [place].  Aspirin and Your Heart Aspirin is a medicine that prevents the platelets in your blood from sticking together. Platelets are the cells that your blood uses for clotting. Aspirin can be used to help reduce the risk of blood clots, heart attacks, and other heart-related problems. What are the risks? Daily use of aspirin can cause side effects. Some of these include: Bleeding. Bleeding can be minor or serious. An example of minor bleeding is bleeding  from a cut, and the bleeding does not stop. An example of more serious bleeding is stomach bleeding or, rarely, bleeding into the brain. Your risk of bleeding increases if you are also taking NSAIDs, such as ibuprofen. Increased bruising. Upset stomach. An allergic reaction. People who have growths inside the nose (nasal polyps) have an increased risk of developing an aspirin allergy. How to use aspirin to care for your heart Take aspirin only as told by your health care provider. Make sure that you understand how much to take and what form to take. The two forms of aspirin are: Non-enteric-coated.This type of aspirin does not have a coating and is absorbed quickly. This type of aspirin also comes in a chewable form. Enteric-coated. This type of aspirin has a coating that releases the medicine very slowly. Enteric-coated aspirin might cause less stomach upset than non-enteric-coated aspirin. This type of aspirin should not be chewed or crushed. Work with your health care  provider to find out whether it is safe and beneficial for you to take aspirin daily. Taking aspirin daily may be helpful if: You have had a heart attack or chest pain, or you are at risk for a heart attack. You have a condition in which certain heart vessels are blocked (coronary artery disease), and you have had a procedure to treat it. Examples are: Open-heart surgery, such as coronary artery bypass surgery (CABG). Coronary angioplasty,which is done to widen a blood vessel of your heart. Having a small mesh tube, or stent, placed in your coronary artery. You have had certain types of stroke or a mini-stroke known as a transient ischemic attack (TIA). You have a narrowing of the arteries that supply the limbs (peripheral artery disease, or PAD). You have long-term (chronic) heart rhythm problems, such as atrial fibrillation, and your health care provider thinks aspirin may help. You have valve disease or have had surgery on a valve. You are considered at increased risk of developing coronary artery disease or PAD.    Follow these instructions at home Medicines Take over-the-counter and prescription medicines only as told by your health care provider. If you are taking blood thinners: Talk with your health care provider before you take any medicines that contain aspirin or NSAIDs, such as ibuprofen. These medicines increase your risk for dangerous bleeding. Take your medicine exactly as told, at the same time every day. Avoid activities that could cause injury or bruising, and follow instructions about how to prevent falls. Wear a medical alert bracelet or carry a card that lists what medicines you take. General instructions Do not drink alcohol if: Your health care provider tells you not to drink. You are pregnant, may be pregnant, or are planning to become pregnant. If you drink alcohol: Limit how much you use to: 0-1 drink a day for women. 0-2 drinks a day for men. Be aware of how  much alcohol is in your drink. In the [place] one drink equals one 12 oz bottle of beer (355 mL), one 5 oz glass of wine (148 mL), or one 1 oz glass of hard liquor (44 mL). Keep all follow-up visits as told by your health care provider. This is important. Where to find more information The American Heart Association: www.heart.org Contact a health care provider if you have: Unusual bleeding or bruising. Stomach pain or nausea. Ringing in your ears. An allergic reaction that causes hives, itchy skin, or swelling of the lips, tongue, or face. Get help right away if: You notice that  your bowel movements are bloody, or dark red or black in color. You vomit or cough up blood. You have blood in your urine. You cough, breathe loudly (wheeze), or feel short of breath. You have chest pain, especially if the pain spreads to your arms, back, neck, or jaw. You have a headache with confusion. You have any symptoms of a stroke. "BE FAST" is an easy way to remember the main warning signs of a stroke: B - Balance. Signs are dizziness, sudden trouble walking, or loss of balance. E - Eyes. Signs are trouble seeing or a sudden change in vision. F - Face. Signs are sudden weakness or numbness of the face, or the face or eyelid drooping on one side. A - Arms. Signs are weakness or numbness in an arm. This happens suddenly and usually on one side of the body. S - Speech. Signs are sudden trouble speaking, slurred speech, or trouble understanding what people say. T - Time. Time to call emergency services. Write down what time symptoms started. You have other signs of a stroke, such as: A sudden, severe headache with no known cause. Nausea or vomiting. Seizure. These symptoms may represent a serious problem that is an emergency. Do not wait to see if the symptoms will go away. Get medical help right away. Call your local emergency services (911 in the [place] Do not drive yourself to the hospital. Summary Aspirin  use can help reduce the risk of blood clots, heart attacks, and other heart-related problems. Daily use of aspirin can cause side effects. Take aspirin only as told by your health care provider. Make sure that you understand how much to take and what form to take. Your health care provider will help you determine whether it is safe and beneficial for you to take aspirin daily. This information is not intended to replace advice given to you by your health care provider. Make sure you discuss any questions you have with your health care provider. Document Revised: 06/07/2019 Document Reviewed: 06/07/2019 Elsevier Patient Education  2021 [place]. Nitroglycerin sublingual tablets What is this medicine? NITROGLYCERIN (nye troe GLI ser in) is a type of vasodilator. It relaxes blood vessels, increasing the blood and oxygen supply to your heart. This medicine is used to relieve chest pain caused by angina. It is also used to prevent chest pain before activities like climbing stairs, going outdoors in cold weather, or sexual activity. This medicine may be used for other purposes; ask your health care provider or pharmacist if you have questions. COMMON BRAND NAME(S): Nitroquick, Nitrostat, Nitrotab What should I tell my health care provider before I take this medicine? They need to know if you have any of these conditions: anemia head injury, recent stroke, or bleeding in the brain liver disease previous heart attack an unusual or allergic reaction to nitroglycerin, other medicines, foods, dyes, or preservatives pregnant or trying to get pregnant breast-feeding How should I use this medicine? Take this medicine by mouth as needed. Use at the first sign of an angina attack (chest pain or tightness). You can also take this medicine 5 to 10 minutes before an event likely to produce chest pain. Follow the directions exactly as written on the prescription label. Place one tablet under your tongue and  let it dissolve. Do not swallow whole. Replace the dose if you accidentally swallow it. It will help if your mouth is not dry. Saliva around the tablet will help it to dissolve more quickly. Do not eat  or drink, smoke or chew tobacco while a tablet is dissolving. Sit down when taking this medicine. In an angina attack, you should feel better within 5 minutes after your first dose. You can take a dose every 5 minutes up to a total of 3 doses. If you do not feel better or feel worse after 1 dose, call 9-1-1 at once. Do not take more than 3 doses in 15 minutes. Your health care provider might give you other directions. Follow those directions if he or she does. Do not take your medicine more often than directed. Talk to your health care provider about the use of this medicine in children. Special care may be needed. Overdosage: If you think you have taken too much of this medicine contact a poison control center or emergency room at once. NOTE: This medicine is only for you. Do not share this medicine with others. What if I miss a dose? This does not apply. This medicine is only used as needed. What may interact with this medicine? Do not take this medicine with any of the following medications: certain migraine medicines like ergotamine and dihydroergotamine (DHE) medicines used to treat erectile dysfunction like sildenafil, tadalafil, and vardenafil riociguat This medicine may also interact with the following medications: alteplase aspirin heparin medicines for high blood pressure medicines for mental depression other medicines used to treat angina phenothiazines like chlorpromazine, mesoridazine, prochlorperazine, thioridazine This list may not describe all possible interactions. Give your health care provider a list of all the medicines, herbs, non-prescription drugs, or dietary supplements you use. Also tell them if you smoke, drink alcohol, or use illegal drugs. Some items may interact with  your medicine. What should I watch for while using this medicine? Tell your doctor or health care professional if you feel your medicine is no longer working. Keep this medicine with you at all times. Sit or lie down when you take your medicine to prevent falling if you feel dizzy or faint after using it. Try to remain calm. This will help you to feel better faster. If you feel dizzy, take several deep breaths and lie down with your feet propped up, or bend forward with your head resting between your knees. You may get drowsy or dizzy. Do not drive, use machinery, or do anything that needs mental alertness until you know how this drug affects you. Do not stand or sit up quickly, especially if you are an older patient. This reduces the risk of dizzy or fainting spells. Alcohol can make you more drowsy and dizzy. Avoid alcoholic drinks. Do not treat yourself for coughs, colds, or pain while you are taking this medicine without asking your doctor or health care professional for advice. Some ingredients may increase your blood pressure. What side effects may I notice from receiving this medicine? Side effects that you should report to your doctor or health care professional as soon as possible: allergic reactions (skin rash, itching or hives; swelling of the face, lips, or tongue) low blood pressure (dizziness; feeling faint or lightheaded, falls; unusually weak or tired) low red blood cell counts (trouble breathing; feeling faint; lightheaded, falls; unusually weak or tired) Side effects that usually do not require medical attention (report to your doctor or health care professional if they continue or are bothersome): facial flushing (redness) headache nausea, vomiting This list may not describe all possible side effects. Call your doctor for medical advice about side effects. You may report side effects to FDA at 1-800-FDA-1088. Where should I  keep my medicine? Keep out of the reach of  children. Store at room temperature between 20 and 25 degrees C (68 and 77 degrees F). Store in [employer]. Protect from light and moisture. Keep tightly closed. Throw away any unused medicine after the expiration date. NOTE: This sheet is a summary. It may not cover all possible information. If you have questions about this medicine, talk to your doctor, pharmacist, or health care provider.  2021 Elsevier/Gold Standard (2018-06-03 16:46:32)

## 2021-04-13 NOTE — H&P (View-Only) (Signed)
Cardiology Office Note:    Date:  04/13/2021   ID:  Lindsay Frost, DOB Aug 27, 1956, MRN [id number]  PCP:  Debbrah Alar, NP  Cardiologist:  Jenean Lindau, MD   Referring MD: Debbrah Alar, NP    ASSESSMENT:    1. Essential hypertension   2. Angina pectoris ([place])    PLAN:    In order of problems listed above:  Angina pectoris: Patient's symptoms are concerning.  I repeated and reemphasized evaluation.  I discussed invasive and noninvasive evaluation.  She opted for coronary angiography.In view of the patient's symptoms, I discussed with the patient options for evaluation. Invasive and noninvasive options were given to the patient. I discussed stress testing and coronary angiography and left heart catheterization at length. Benefits, pros and cons of each approach were discussed at length. Patient had multiple questions which were answered to the patient's satisfaction. Patient opted for invasive evaluation and we will set up for coronary angiography and left heart catheterization. Further recommendations will be made based on the findings with coronary angiography. In the interim if the patient has any significant symptoms in hospital to the nearest emergency room.  She will continue 81 mg aspirin on a daily basis.  Sublingual nitroglycerin protocol was also explained. Essential hypertension: Blood pressure stable and diet was emphasized. Mixed dyslipidemia: Diet was emphasized lipid lowering emphasized.  It is possible that she will need lipid-lowering therapy in the near future but I will discuss this at follow-up appointment in a month.  Patient had multiple questions which were answered to her satisfaction.   Medication Adjustments/Labs and Tests Ordered: Current medicines are reviewed at length with the patient today.  Concerns regarding medicines are outlined above.  Orders Placed This Encounter  Procedures   Basic metabolic panel   CBC with Differential/Platelet    EKG 12-Lead   No orders of the defined types were placed in this encounter.    No chief complaint on file.    History of Present Illness:    Lindsay Frost is a 65 y.o. female.  Patient has past medical history of essential hypertension and dyslipidemia.  She mentions to me that she has chest pain on and off.  She uses nitroglycerin with relief.  I have recommended coronary angiography to her in the past but she did not show up for the test.  She tells me that she has recurrent symptoms and now willing to undergo coronary angiography.  Currently she has no chest pain orthopnea or PND but it happens to her on and off and she is concerned about it.  She has chest tightness in the substernal area with no radiation to any part of the body.  At the time of my evaluation, the patient is alert awake oriented and in no distress.  Past Medical History:  Diagnosis Date   Acute midline thoracic back pain 02/16/2021   Allergic rhinitis 07/16/2016   Angina pectoris ([place]) 07/25/2020   Arthritis    B12 deficiency 01/02/2021   Back pain    arthritis   Chronic sinusitis 12/05/2020   Depression    takes cymbalta daily   Diarrhea 02/16/2021   Duodenal stenosis    Epigastric pain 07/29/2018   Essential hypertension 05/14/2019   Gallstones    Generalized abdominal pain 02/16/2021   GERD (gastroesophageal reflux disease)    takes Omeprazole daily   Headache    Heart murmur    Hiatal hernia    History of gastric ulcer  History of migraine    last one about 2wks ago-takes Imitrex prn   History of seizures    Joint pain    Joint swelling    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   Meningioma ([place])    Migraine without aura and without status migrainosus, not intractable 01/13/2015   Mixed dyslipidemia 07/25/2020   Myringotomy tube status 02/27/2021   Nausea 02/16/2021   Nocturnal leg cramps 02/20/2015   Obesity (BMI 30-39.9) 07/22/2014   Osteoarthritis of  left knee 12/02/2012   Overactive bladder 01/02/2021   Peptic ulcer disease    Physical exam 07/22/2014   Recurrent acute serous otitis media of left ear 12/05/2020   Seizure-like activity ([place]) 05/12/2014    Past Surgical History:  Procedure Laterality Date   BALLOON DILATION  04/03/2012   Procedure: BALLOON DILATION;  Surgeon: Beryle Beams, MD;  [facility];  Service: Endoscopy;  Laterality: N/A;   BIOPSY  08/24/2019   Procedure: BIOPSY;  Surgeon: Lavena Bullion, DO;  [facility];  Service: Gastroenterology;;  EGD and COLON   BRAVO [place] STUDY N/A 08/24/2019   Procedure: BRAVO [place];  Surgeon: Lavena Bullion, DO;  [facility];  Service: Gastroenterology;  Laterality: N/A;   CHOLECYSTECTOMY N/A 04/24/2018   Procedure: LAPAROSCOPIC CHOLECYSTECTOMY;  Surgeon: Clovis Riley, MD;  [facility];  Service: General;  Laterality: N/A;   COLONOSCOPY     COLONOSCOPY WITH PROPOFOL N/A 08/24/2019   Procedure: COLONOSCOPY WITH PROPOFOL;  Surgeon: Lavena Bullion, DO;  [facility];  Service: Gastroenterology;  Laterality: N/A;   DIAGNOSTIC LAPAROSCOPY  10yr ago   ESOPHAGEAL MANOMETRY N/A 09/04/2015   Procedure: ESOPHAGEAL MANOMETRY (EM);  Surgeon: PCarol Ada MD;  [facility];  Service: Endoscopy;  Laterality: N/A;   ESOPHAGOGASTRODUODENOSCOPY  04/03/2012   Procedure: ESOPHAGOGASTRODUODENOSCOPY (EGD);  Surgeon: PBeryle Beams MD;  [facility];  Service: Endoscopy;  Laterality: N/A;  EGD w/ balloon dilation   ESOPHAGOGASTRODUODENOSCOPY (EGD) WITH PROPOFOL N/A 02/18/2014   Procedure: ESOPHAGOGASTRODUODENOSCOPY (EGD) WITH PROPOFOL;  Surgeon: PBeryle Beams MD;  [facility];  Service: Endoscopy;  Laterality: N/A;   ESOPHAGOGASTRODUODENOSCOPY (EGD) WITH PROPOFOL N/A 04/22/2014   Procedure: ESOPHAGOGASTRODUODENOSCOPY (EGD) WITH PROPOFOL;  Surgeon: PBeryle Beams MD;  [facility];  Service: Endoscopy;   Laterality: N/A;   ESOPHAGOGASTRODUODENOSCOPY (EGD) WITH PROPOFOL N/A 08/24/2019   Procedure: ESOPHAGOGASTRODUODENOSCOPY (EGD) WITH PROPOFOL;  Surgeon: CLavena Bullion DO;  [facility];  Service: Gastroenterology;  Laterality: N/A;   NASAL SEPTUM SURGERY  09/2016   [place]ED[place] STUDY N/A 09/04/2015   Procedure: [place]EDANCE STUDY;  Surgeon: PCarol Ada MD;  [facility];  Service: Endoscopy;  Laterality: N/A;   REPLACEMENT TOTAL KNEE Right [place]   TOTAL KNEE ARTHROPLASTY Left 11/30/2012   Procedure: LEFT TOTAL KNEE ARTHROPLASTY;  Surgeon: FrKerin SalenMD;  [facility];  Laterality: Left;    Current Medications: Current Meds  Medication Sig   alendronate (FOSAMAX) 70 MG tablet Take 1 tablet (70 mg total) by mouth every 7 (seven) days. Take with a full glass of water on an empty stomach.   amLODipine (NORVASC) 5 MG tablet Take 1 tablet (5 mg total) by mouth daily.   aspirin EC 81 MG tablet Take 1 tablet (81 mg total) by mouth daily. Swallow whole.   Aspirin-Salicylamide-Caffeine (BC HEADACHE PO) Take 1 packet by mouth daily as needed for headache (headaches).  azelastine (ASTELIN) 0.1 % nasal spray Place 2 sprays into both nostrils 2 (two) times daily.   dexlansoprazole (DEXILANT) 60 MG capsule TAKE 1 CAPSULE(60 MG) BY MOUTH DAILY   eletriptan (RELPAX) 40 MG tablet TAKE 1 TABLET BY MOUTH AS NEEDED FOR MIGRAINE OR HEADACHE, MAY REPEAT IN 2 HOURS IF HEADACHE PERSISTS OR RECURS   fluticasone (FLONASE) 50 MCG/ACT nasal spray Place 2 sprays into both nostrils daily.   gatifloxacin (ZYMAXID) 0.5 % SOLN Place 1 drop into the left eye 4 (four) times daily.   ketorolac (ACULAR) 0.5 % ophthalmic solution Place 1 drop into the left eye 4 (four) times daily.   Lifitegrast (XIIDRA) 5 % SOLN Apply 1 drop to eye 2 (two) times daily.   methocarbamol (ROBAXIN) 500 MG tablet Take 1 tablet (500 mg total) by mouth at bedtime as needed for muscle spasms.    MYRBETRIQ 25 MG TB24 tablet TAKE 1 TABLET(25 MG) BY MOUTH DAILY   nitroGLYCERIN (NITROSTAT) 0.4 MG SL tablet Place 0.4 mg under the tongue every 5 (five) minutes as needed for chest pain.   ondansetron (ZOFRAN) 4 MG tablet Take 1 tablet (4 mg total) by mouth every 8 (eight) hours as needed for nausea or vomiting.   prednisoLONE acetate (PRED FORTE) 1 % ophthalmic suspension Place 1 drop into the left eye 4 (four) times daily.   topiramate (TOPAMAX) 100 MG tablet TAKE 1 TABLET(100 MG) BY MOUTH TWICE DAILY     Allergies:   Naratriptan   Social History   Socioeconomic History   Marital status: Widowed    Spouse name: Not on file   Number of children: 1   Years of education: Not on file   Highest education level: Some college, no degree  Occupational History   Occupation: Retired  Tobacco Use   Smoking status: Never   Smokeless tobacco: Never  Vaping Use   Vaping Use: Never used  Substance and Sexual Activity   Alcohol use: No   Drug use: No   Sexual activity: Not Currently    Birth control/protection: Post-menopausal  Other Topics Concern   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, mining   Single   1 daughter- lives in [place]   2 dogs   Enjoys reading, watching television   Pt is right handed   Drinks coffee once a week, green tea daily, soda sometimes    Social Determinants of Radio broadcast assistant Strain: Not on file  Food Insecurity: Not on file  Transportation Needs: Not on file  Physical Activity: Not on file  Stress: Not on file  Social Connections: Not on file     Family History: The patient's family history includes Aneurysm in her sister; Arthritis in her brother, mother, sister, and sister; Cancer in her mother; Depression in her mother; Healthy in her daughter; Heart disease in her father and mother; Heart murmur in her sister; Kidney disease in her father. There is no history of Colon cancer  or Esophageal cancer.  ROS:   Please see the history of present illness.    All other systems reviewed and are negative.  EKGs/Labs/Other Studies Reviewed:    The following studies were reviewed today: EKG reveals sinus rhythm and nonspecific ST-T changes   Recent Labs: 02/16/2021: ALT 11; BUN 10; Creat 1.08; Hemoglobin 13.9; Platelets 350; Potassium 4.1; Sodium 140  Recent Lipid Panel    Component Value Date/Time   CHOL  246 (H) 01/07/2017 1115   TRIG 85.0 01/07/2017 1115   HDL 55.30 01/07/2017 1115   CHOLHDL 4 01/07/2017 1115   VLDL 17.0 01/07/2017 1115   LDLCALC 173 (H) 01/07/2017 1115    Physical Exam:    VS:  BP 122/74   Pulse 88   Ht '5\' 4"'$  (1.626 m)   Wt 143 lb 0.6 oz (64.9 kg)   SpO2 97%   BMI 24.55 kg/m     Wt Readings from Last 3 Encounters:  04/13/21 143 lb 0.6 oz (64.9 kg)  02/16/21 141 lb (64 kg)  01/30/21 144 lb (65.3 kg)     GEN: Patient is in no acute distress HEENT: Normal NECK: No JVD; No carotid bruits LYMPHATICS: No lymphadenopathy CARDIAC: Hear sounds regular, 2/6 systolic murmur at the apex. RESPIRATORY:  Clear to auscultation without rales, wheezing or rhonchi  ABDOMEN: Soft, non-tender, non-distended MUSCULOSKELETAL:  No edema; No deformity  SKIN: Warm and dry NEUROLOGIC:  Alert and oriented x 3 PSYCHIATRIC:  Normal affect   Signed, Jenean Lindau, MD  04/13/2021 3:43 PM    [facility]

## 2021-04-13 NOTE — Progress Notes (Signed)
Cardiology Office Note:    Date:  04/13/2021   ID:  Lindsay Frost, DOB [birth year], MRN [id number]  PCP:  Debbrah Alar, NP  Cardiologist:  Jenean Lindau, MD   Referring MD: Debbrah Alar, NP    ASSESSMENT:    1. Essential hypertension   2. Angina pectoris ([place])    PLAN:    In order of problems listed above:  Angina pectoris: Patient's symptoms are concerning.  I repeated and reemphasized evaluation.  I discussed invasive and noninvasive evaluation.  She opted for coronary angiography.In view of the patient's symptoms, I discussed with the patient options for evaluation. Invasive and noninvasive options were given to the patient. I discussed stress testing and coronary angiography and left heart catheterization at length. Benefits, pros and cons of each approach were discussed at length. Patient had multiple questions which were answered to the patient's satisfaction. Patient opted for invasive evaluation and we will set up for coronary angiography and left heart catheterization. Further recommendations will be made based on the findings with coronary angiography. In the interim if the patient has any significant symptoms in hospital to the nearest emergency room.  She will continue 81 mg aspirin on a daily basis.  Sublingual nitroglycerin protocol was also explained. Essential hypertension: Blood pressure stable and diet was emphasized. Mixed dyslipidemia: Diet was emphasized lipid lowering emphasized.  It is possible that she will need lipid-lowering therapy in the near future but I will discuss this at follow-up appointment in a month.  Patient had multiple questions which were answered to her satisfaction.   Medication Adjustments/Labs and Tests Ordered: Current medicines are reviewed at length with the patient today.  Concerns regarding medicines are outlined above.  Orders Placed This Encounter  Procedures   Basic metabolic panel   CBC with Differential/Platelet    EKG 12-Lead   No orders of the defined types were placed in this encounter.    No chief complaint on file.    History of Present Illness:    Lindsay Frost is a 65 y.o. female.  Patient has past medical history of essential hypertension and dyslipidemia.  She mentions to me that she has chest pain on and off.  She uses nitroglycerin with relief.  I have recommended coronary angiography to her in the past but she did not show up for the test.  She tells me that she has recurrent symptoms and now willing to undergo coronary angiography.  Currently she has no chest pain orthopnea or PND but it happens to her on and off and she is concerned about it.  She has chest tightness in the substernal area with no radiation to any part of the body.  At the time of my evaluation, the patient is alert awake oriented and in no distress.  Past Medical History:  Diagnosis Date   Acute midline thoracic back pain 02/16/2021   Allergic rhinitis 07/16/2016   Angina pectoris ([place]) 07/25/2020   Arthritis    B12 deficiency 01/02/2021   Back pain    arthritis   Chronic sinusitis 12/05/2020   Depression    takes cymbalta daily   Diarrhea 02/16/2021   Duodenal stenosis    Epigastric pain 07/29/2018   Essential hypertension 05/14/2019   Gallstones    Generalized abdominal pain 02/16/2021   GERD (gastroesophageal reflux disease)    takes Omeprazole daily   Headache    Heart murmur    Hiatal hernia    History of gastric ulcer  History of migraine    last one about 2wks ago-takes Imitrex prn   History of seizures    Joint pain    Joint swelling    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[place]) 01/13/2015   Meningioma ([place])    Migraine without aura and without status migrainosus, not intractable 01/13/2015   Mixed dyslipidemia 07/25/2020   Myringotomy tube status 02/27/2021   Nausea 02/16/2021   Nocturnal leg cramps 02/20/2015   Obesity (BMI 30-39.9) 07/22/2014   Osteoarthritis of  left knee 12/02/2012   Overactive bladder 01/02/2021   Peptic ulcer disease    Physical exam 07/22/2014   Recurrent acute serous otitis media of left ear 12/05/2020   Seizure-like activity ([place]) 05/12/2014    Past Surgical History:  Procedure Laterality Date   BALLOON DILATION  04/03/2012   Procedure: BALLOON DILATION;  Surgeon: Beryle Beams, MD;  [facility];  Service: Endoscopy;  Laterality: N/A;   BIOPSY  08/24/2019   Procedure: BIOPSY;  Surgeon: Lavena Bullion, DO;  [facility];  Service: Gastroenterology;;  EGD and COLON   BRAVO [place] STUDY N/A 08/24/2019   Procedure: BRAVO [place];  Surgeon: Lavena Bullion, DO;  [facility];  Service: Gastroenterology;  Laterality: N/A;   CHOLECYSTECTOMY N/A 04/24/2018   Procedure: LAPAROSCOPIC CHOLECYSTECTOMY;  Surgeon: Clovis Riley, MD;  [facility];  Service: General;  Laterality: N/A;   COLONOSCOPY     COLONOSCOPY WITH PROPOFOL N/A 08/24/2019   Procedure: COLONOSCOPY WITH PROPOFOL;  Surgeon: Lavena Bullion, DO;  [facility];  Service: Gastroenterology;  Laterality: N/A;   DIAGNOSTIC LAPAROSCOPY  23yr ago   ESOPHAGEAL MANOMETRY N/A 09/04/2015   Procedure: ESOPHAGEAL MANOMETRY (EM);  Surgeon: PCarol Ada MD;  [facility];  Service: Endoscopy;  Laterality: N/A;   ESOPHAGOGASTRODUODENOSCOPY  04/03/2012   Procedure: ESOPHAGOGASTRODUODENOSCOPY (EGD);  Surgeon: PBeryle Beams MD;  [facility];  Service: Endoscopy;  Laterality: N/A;  EGD w/ balloon dilation   ESOPHAGOGASTRODUODENOSCOPY (EGD) WITH PROPOFOL N/A 02/18/2014   Procedure: ESOPHAGOGASTRODUODENOSCOPY (EGD) WITH PROPOFOL;  Surgeon: PBeryle Beams MD;  [facility];  Service: Endoscopy;  Laterality: N/A;   ESOPHAGOGASTRODUODENOSCOPY (EGD) WITH PROPOFOL N/A 04/22/2014   Procedure: ESOPHAGOGASTRODUODENOSCOPY (EGD) WITH PROPOFOL;  Surgeon: PBeryle Beams MD;  [facility];  Service: Endoscopy;   Laterality: N/A;   ESOPHAGOGASTRODUODENOSCOPY (EGD) WITH PROPOFOL N/A 08/24/2019   Procedure: ESOPHAGOGASTRODUODENOSCOPY (EGD) WITH PROPOFOL;  Surgeon: CLavena Bullion DO;  [facility];  Service: Gastroenterology;  Laterality: N/A;   NASAL SEPTUM SURGERY  09/2016   [place]IMPEDANCE STUDY N/A 09/04/2015   Procedure: [place] STUDY;  Surgeon: PCarol Ada MD;  [facility];  Service: Endoscopy;  Laterality: N/A;   REPLACEMENT TOTAL KNEE Right [place]yr[place]   TOTAL KNEE ARTHROPLASTY Left 11/30/2012   Procedure: LEFT TOTAL KNEE ARTHROPLASTY;  Surgeon: FrKerin SalenMD;  [facility];  Laterality: Left;    Current Medications: Current Meds  Medication Sig   alendronate (FOSAMAX) 70 MG tablet Take 1 tablet (70 mg total) by mouth every 7 (seven) days. Take with a full glass of water on an empty stomach.   amLODipine (NORVASC) 5 MG tablet Take 1 tablet (5 mg total) by mouth daily.   aspirin EC 81 MG tablet Take 1 tablet (81 mg total) by mouth daily. Swallow whole.   Aspirin-Salicylamide-Caffeine (BC HEADACHE PO) Take 1 packet by mouth daily as needed for headache (headaches).  azelastine (ASTELIN) 0.1 % nasal spray Place 2 sprays into both nostrils 2 (two) times daily.   dexlansoprazole (DEXILANT) 60 MG capsule TAKE 1 CAPSULE(60 MG) BY MOUTH DAILY   eletriptan (RELPAX) 40 MG tablet TAKE 1 TABLET BY MOUTH AS NEEDED FOR MIGRAINE OR HEADACHE, MAY REPEAT IN 2 HOURS IF HEADACHE PERSISTS OR RECURS   fluticasone (FLONASE) 50 MCG/ACT nasal spray Place 2 sprays into both nostrils daily.   gatifloxacin (ZYMAXID) 0.5 % SOLN Place 1 drop into the left eye 4 (four) times daily.   ketorolac (ACULAR) 0.5 % ophthalmic solution Place 1 drop into the left eye 4 (four) times daily.   Lifitegrast (XIIDRA) 5 % SOLN Apply 1 drop to eye 2 (two) times daily.   methocarbamol (ROBAXIN) 500 MG tablet Take 1 tablet (500 mg total) by mouth at bedtime as needed for muscle spasms.    MYRBETRIQ 25 MG TB24 tablet TAKE 1 TABLET(25 MG) BY MOUTH DAILY   nitroGLYCERIN (NITROSTAT) 0.4 MG SL tablet Place 0.4 mg under the tongue every 5 (five) minutes as needed for chest pain.   ondansetron (ZOFRAN) 4 MG tablet Take 1 tablet (4 mg total) by mouth every 8 (eight) hours as needed for nausea or vomiting.   prednisoLONE acetate (PRED FORTE) 1 % ophthalmic suspension Place 1 drop into the left eye 4 (four) times daily.   topiramate (TOPAMAX) 100 MG tablet TAKE 1 TABLET(100 MG) BY MOUTH TWICE DAILY     Allergies:   Naratriptan   Social History   Socioeconomic History   Marital status: Widowed    Spouse name: Not on file   Number of children: 1   Years of education: Not on file   Highest education level: Some college, no degree  Occupational History   Occupation: Retired  Tobacco Use   Smoking status: Never   Smokeless tobacco: Never  Vaping Use   Vaping Use: Never used  Substance and Sexual Activity   Alcohol use: No   Drug use: No   Sexual activity: Not Currently    Birth control/protection: Post-menopausal  Other Topics Concern   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, mining   Single   1 daughter- lives in [place]   2 dogs   Enjoys reading, watching television   Pt is right handed   Drinks coffee once a week, green tea daily, soda sometimes    Social Determinants of Radio broadcast assistant Strain: Not on file  Food Insecurity: Not on file  Transportation Needs: Not on file  Physical Activity: Not on file  Stress: Not on file  Social Connections: Not on file     Family History: The patient's family history includes Aneurysm in her sister; Arthritis in her brother, mother, sister, and sister; Cancer in her mother; Depression in her mother; Healthy in her daughter; Heart disease in her father and mother; Heart murmur in her sister; Kidney disease in her father. There is no history of Colon cancer  or Esophageal cancer.  ROS:   Please see the history of present illness.    All other systems reviewed and are negative.  EKGs/Labs/Other Studies Reviewed:    The following studies were reviewed today: EKG reveals sinus rhythm and nonspecific ST-T changes   Recent Labs: 02/16/2021: ALT 11; BUN 10; Creat 1.08; Hemoglobin 13.9; Platelets 350; Potassium 4.1; Sodium 140  Recent Lipid Panel    Component Value Date/Time   CHOL  246 (H) 01/07/2017 1115   TRIG 85.0 01/07/2017 1115   HDL 55.30 01/07/2017 1115   CHOLHDL 4 01/07/2017 1115   VLDL 17.0 01/07/2017 1115   LDLCALC 173 (H) 01/07/2017 1115    Physical Exam:    VS:  BP 122/74   Pulse 88   Ht '5\' 4"'$  (1.626 m)   Wt 143 lb 0.6 oz (64.9 kg)   SpO2 97%   BMI 24.55 kg/m     Wt Readings from Last 3 Encounters:  04/13/21 143 lb 0.6 oz (64.9 kg)  02/16/21 141 lb (64 kg)  01/30/21 144 lb (65.3 kg)     GEN: Patient is in no acute distress HEENT: Normal NECK: No JVD; No carotid bruits LYMPHATICS: No lymphadenopathy CARDIAC: Hear sounds regular, 2/6 systolic murmur at the apex. RESPIRATORY:  Clear to auscultation without rales, wheezing or rhonchi  ABDOMEN: Soft, non-tender, non-distended MUSCULOSKELETAL:  No edema; No deformity  SKIN: Warm and dry NEUROLOGIC:  Alert and oriented x 3 PSYCHIATRIC:  Normal affect   Signed, Jenean Lindau, MD  04/13/2021 3:43 PM    [facility]

## 2021-04-14 LAB — BASIC METABOLIC PANEL
BUN/Creatinine Ratio: 14 (ref 12–28)
BUN: 15 mg/dL (ref 8–27)
CO2: 21 mmol/L (ref 20–29)
Calcium: 9.3 mg/dL (ref 8.7–10.3)
Chloride: 108 mmol/L — ABNORMAL HIGH (ref 96–106)
Creatinine, Ser: 1.1 mg/dL — ABNORMAL HIGH (ref 0.57–1.00)
Glucose: 87 mg/dL (ref 65–99)
Potassium: 4.1 mmol/L (ref 3.5–5.2)
Sodium: 144 mmol/L (ref 134–144)
eGFR: 56 mL/min/{1.73_m2} — ABNORMAL LOW (ref >59)

## 2021-04-14 LAB — CBC WITH DIFFERENTIAL/PLATELET
Basophils Absolute: 0.1 10*3/uL (ref 0.0–0.2)
Basos: 2 %
EOS (ABSOLUTE): 0.1 10*3/uL (ref 0.0–0.4)
Eos: 1 %
Hematocrit: 40.2 % (ref 34.0–46.6)
Hemoglobin: 13.3 g/dL (ref 11.1–15.9)
Immature Grans (Abs): 0 10*3/uL (ref 0.0–0.1)
Immature Granulocytes: 0 %
Lymphocytes Absolute: 2 10*3/uL (ref 0.7–3.1)
Lymphs: 31 %
MCH: 29.8 pg (ref 26.6–33.0)
MCHC: 33.1 g/dL (ref 31.5–35.7)
MCV: 90 fL (ref 79–97)
Monocytes Absolute: 0.5 10*3/uL (ref 0.1–0.9)
Monocytes: 8 %
Neutrophils Absolute: 3.7 10*3/uL (ref 1.4–7.0)
Neutrophils: 58 %
Platelets: 369 10*3/uL (ref 150–450)
RBC: 4.46 x10E6/uL (ref 3.77–5.28)
RDW: 13.1 % (ref 11.7–15.4)
WBC: 6.4 10*3/uL (ref 3.4–10.8)

## 2021-04-16 ENCOUNTER — Telehealth: Payer: Self-pay

## 2021-04-16 NOTE — Telephone Encounter (Signed)
Left message on patients voicemail to please return our call.   

## 2021-04-16 NOTE — Telephone Encounter (Signed)
-----   Message from Jenean Lindau, MD sent at 04/16/2021  3:29 PM EDT ----- The results of the study is unremarkable. Please inform patient. I will discuss in detail at next appointment. Cc  primary care/referring physician Jenean Lindau, MD 04/16/2021 3:29 PM

## 2021-04-17 ENCOUNTER — Telehealth: Payer: Self-pay | Admitting: *Deleted

## 2021-04-17 NOTE — Telephone Encounter (Addendum)
Cardiac catheterization scheduled at [facility] for: Wednesday April 18, 2021 1 PM [facility] Main Entrance [facility]) at: 11 AM   No solid food after midnight prior to cath, clear liquids until 5 AM day of procedure.    AM meds can be  taken pre-cath with sips of water including:   aspirin 81 mg   Confirmed patient has responsible adult to drive home post procedure and be with patient first 24 hours after arriving home.  Patients are allowed one visitor in the waiting room during the time they are at the hospital for their procedure. Both patient and visitor must wear a mask once they enter the hospital.   Patient reports does not currently have any symptoms concerning for COVID-19 and no household members with COVID-19 like illness.       Reviewed procedure/mask/visitor instructions with patient.

## 2021-04-18 ENCOUNTER — Encounter (HOSPITAL_COMMUNITY): Payer: Self-pay | Admitting: Cardiovascular Disease

## 2021-04-18 ENCOUNTER — Other Ambulatory Visit: Payer: Self-pay

## 2021-04-18 ENCOUNTER — Ambulatory Visit (HOSPITAL_COMMUNITY)
Admission: RE | Admit: 2021-04-18 | Discharge: 2021-04-18 | Disposition: A | Discharge status: Home / Self Care | Payer: Medicare Other | Attending: Cardiology | Admitting: Cardiology

## 2021-04-18 ENCOUNTER — Ambulatory Visit (HOSPITAL_COMMUNITY)
Admission: RE | Disposition: A | Discharge status: Home / Self Care | Payer: Self-pay | Source: Home / Self Care | Attending: Cardiology

## 2021-04-18 DIAGNOSIS — Z9049 Acquired absence of other specified parts of digestive tract: Secondary | ICD-10-CM | POA: Insufficient documentation

## 2021-04-18 DIAGNOSIS — I10 Essential (primary) hypertension: Secondary | ICD-10-CM

## 2021-04-18 DIAGNOSIS — Z8249 Family history of ischemic heart disease and other diseases of the circulatory system: Secondary | ICD-10-CM | POA: Diagnosis not present

## 2021-04-18 DIAGNOSIS — Z7982 Long term (current) use of aspirin: Secondary | ICD-10-CM | POA: Insufficient documentation

## 2021-04-18 DIAGNOSIS — Z7983 Long term (current) use of bisphosphonates: Secondary | ICD-10-CM | POA: Diagnosis not present

## 2021-04-18 DIAGNOSIS — E669 Obesity, unspecified: Secondary | ICD-10-CM | POA: Insufficient documentation

## 2021-04-18 DIAGNOSIS — I209 Angina pectoris, unspecified: Secondary | ICD-10-CM

## 2021-04-18 DIAGNOSIS — Z888 Allergy status to other drugs, medicaments and biological substances status: Secondary | ICD-10-CM | POA: Diagnosis not present

## 2021-04-18 DIAGNOSIS — E782 Mixed hyperlipidemia: Secondary | ICD-10-CM

## 2021-04-18 DIAGNOSIS — Z96653 Presence of artificial knee joint, bilateral: Secondary | ICD-10-CM | POA: Diagnosis not present

## 2021-04-18 DIAGNOSIS — Z79899 Other long term (current) drug therapy: Secondary | ICD-10-CM | POA: Diagnosis not present

## 2021-04-18 DIAGNOSIS — Z6824 Body mass index (BMI) 24.0-24.9, adult: Secondary | ICD-10-CM | POA: Diagnosis not present

## 2021-04-18 HISTORY — PX: LEFT HEART CATH AND CORONARY ANGIOGRAPHY: CATH118249

## 2021-04-18 SURGERY — LEFT HEART CATH AND CORONARY ANGIOGRAPHY
Anesthesia: LOCAL

## 2021-04-18 MED ORDER — VERAPAMIL HCL 2.5 MG/ML IV SOLN
INTRAVENOUS | Status: DC | PRN
  Administered 2021-04-18: 10 mL via INTRA_ARTERIAL

## 2021-04-18 MED ORDER — SODIUM CHLORIDE 0.9 % IV SOLN: 250.0000 mL | INTRAVENOUS | Status: DC | PRN

## 2021-04-18 MED ORDER — NITROGLYCERIN 1 MG/10 ML FOR IR/CATH LAB
INTRA_ARTERIAL | Status: AC
  Filled 2021-04-18: qty 10

## 2021-04-18 MED ORDER — AMLODIPINE BESYLATE 5 MG PO TABS: 5.0000 mg | ORAL_TABLET | Freq: Every day | ORAL | Status: DC

## 2021-04-18 MED ORDER — HEPARIN (PORCINE) IN NACL 1000-0.9 UT/500ML-% IV SOLN
INTRAVENOUS | Status: AC
  Filled 2021-04-18: qty 1000

## 2021-04-18 MED ORDER — HEPARIN SODIUM (PORCINE) 1000 UNIT/ML IJ SOLN
INTRAMUSCULAR | Status: AC
  Filled 2021-04-18: qty 1

## 2021-04-18 MED ORDER — VERAPAMIL HCL 2.5 MG/ML IV SOLN
INTRAVENOUS | Status: AC
  Filled 2021-04-18: qty 2

## 2021-04-18 MED ORDER — IOHEXOL 350 MG/ML SOLN
INTRAVENOUS | Status: DC | PRN
  Administered 2021-04-18: 55 mL

## 2021-04-18 MED ORDER — ASPIRIN 81 MG PO CHEW: 81.0000 mg | CHEWABLE_TABLET | ORAL | Status: DC

## 2021-04-18 MED ORDER — FENTANYL CITRATE (PF) 100 MCG/2ML IJ SOLN
INTRAMUSCULAR | Status: DC | PRN
  Administered 2021-04-18 (×2): 25 ug via INTRAVENOUS

## 2021-04-18 MED ORDER — PANTOPRAZOLE SODIUM 40 MG PO TBEC: 40.0000 mg | DELAYED_RELEASE_TABLET | Freq: Every day | ORAL | Status: DC

## 2021-04-18 MED ORDER — FENTANYL CITRATE (PF) 100 MCG/2ML IJ SOLN
INTRAMUSCULAR | Status: AC
  Filled 2021-04-18: qty 2

## 2021-04-18 MED ORDER — NITROGLYCERIN 0.4 MG SL SUBL: 0.4000 mg | SUBLINGUAL_TABLET | SUBLINGUAL | Status: DC | PRN

## 2021-04-18 MED ORDER — VITAMIN C PO CHEW: 1.0000 | CHEWABLE_TABLET | Freq: Every day | ORAL | Status: DC

## 2021-04-18 MED ORDER — ALUMINUM-MAGNESIUM-SIMETHICONE 200-200-20 MG/5ML PO SUSP: 30.0000 mL | Freq: Every day | ORAL | Status: DC | PRN

## 2021-04-18 MED ORDER — ASPIRIN EC 81 MG PO TBEC: 81.0000 mg | DELAYED_RELEASE_TABLET | Freq: Every day | ORAL | Status: DC

## 2021-04-18 MED ORDER — LIDOCAINE HCL (PF) 1 % IJ SOLN
INTRAMUSCULAR | Status: DC | PRN
  Administered 2021-04-18: 2 mL

## 2021-04-18 MED ORDER — MIDAZOLAM HCL 2 MG/2ML IJ SOLN
INTRAMUSCULAR | Status: AC
  Filled 2021-04-18: qty 2

## 2021-04-18 MED ORDER — SODIUM CHLORIDE 0.9% FLUSH: 3.0000 mL | INTRAVENOUS | Status: DC | PRN

## 2021-04-18 MED ORDER — HEPARIN (PORCINE) IN NACL 1000-0.9 UT/500ML-% IV SOLN
INTRAVENOUS | Status: DC | PRN
  Administered 2021-04-18 (×2): 500 mL

## 2021-04-18 MED ORDER — LIDOCAINE HCL (PF) 1 % IJ SOLN
INTRAMUSCULAR | Status: AC
  Filled 2021-04-18: qty 30

## 2021-04-18 MED ORDER — SODIUM CHLORIDE 0.9 % WEIGHT BASED INFUSION: 1.0000 mL/kg/h | INTRAVENOUS | Status: DC

## 2021-04-18 MED ORDER — HEPARIN SODIUM (PORCINE) 1000 UNIT/ML IJ SOLN
INTRAMUSCULAR | Status: DC | PRN
  Administered 2021-04-18: 3500 [IU] via INTRAVENOUS

## 2021-04-18 MED ORDER — MIDAZOLAM HCL 2 MG/2ML IJ SOLN
INTRAMUSCULAR | Status: DC | PRN
  Administered 2021-04-18 (×2): 1 mg via INTRAVENOUS

## 2021-04-18 MED ORDER — SODIUM CHLORIDE 0.9% FLUSH: 3.0000 mL | Freq: Two times a day (BID) | INTRAVENOUS | Status: DC

## 2021-04-18 MED ORDER — SODIUM CHLORIDE 0.9 % WEIGHT BASED INFUSION
3.0000 mL/kg/h | INTRAVENOUS | Status: DC
  Administered 2021-04-18: 3 mL/kg/h via INTRAVENOUS

## 2021-04-18 MED ORDER — ONDANSETRON HCL 4 MG PO TABS: 4.0000 mg | ORAL_TABLET | Freq: Three times a day (TID) | ORAL | Status: DC | PRN

## 2021-04-18 SURGICAL SUPPLY — 13 items
CATH INFINITI 5FR ANG PIGTAIL (CATHETERS) ×2 IMPLANT
CATH OPTITORQUE TIG 4.0 5F (CATHETERS) ×2 IMPLANT
DEVICE RAD COMP TR BAND LRG (VASCULAR PRODUCTS) ×2 IMPLANT
GLIDESHEATH SLEND A-KIT 6F 22G (SHEATH) ×2 IMPLANT
GLIDESHEATH SLEND SS 6F .021 (SHEATH) IMPLANT
GUIDEWIRE INQWIRE 1.5J.035X260 (WIRE) ×1 IMPLANT
INQWIRE 1.5J .035X260CM (WIRE) ×2
KIT HEART LEFT (KITS) ×2 IMPLANT
PACK CARDIAC CATHETERIZATION (CUSTOM PROCEDURE TRAY) ×2 IMPLANT
SHEATH PROBE COVER 6X72 (BAG) ×2 IMPLANT
TRANSDUCER W/STOPCOCK (MISCELLANEOUS) ×2 IMPLANT
TUBING CIL FLEX 10 FLL-RA (TUBING) ×2 IMPLANT
WIRE HI TORQ VERSACORE-J 145CM (WIRE) ×2 IMPLANT

## 2021-04-18 NOTE — Progress Notes (Signed)
Arm board applied to right arm

## 2021-04-18 NOTE — Interval H&P Note (Signed)
Cath Lab Visit (complete for each Cath Lab visit)  Clinical Evaluation Leading to the Procedure:   ACS: No.  Non-ACS:    Anginal Classification: CCS II  Anti-ischemic medical therapy: No Therapy  Non-Invasive Test Results: No non-invasive testing performed  Prior CABG: No previous CABG      History and Physical Interval Note:  04/18/2021 3:09 PM  Lindsay Frost  has presented today for surgery, with the diagnosis of angina.  The various methods of treatment have been discussed with the patient and family. After consideration of risks, benefits and other options for treatment, the patient has consented to  Procedure(s): LEFT HEART CATH AND CORONARY ANGIOGRAPHY (N/A) as a surgical intervention.  The patient's history has been reviewed, patient examined, no change in status, stable for surgery.  I have reviewed the patient's chart and labs.  Questions were answered to the patient's satisfaction.     Quay Burow

## 2021-04-18 NOTE — Progress Notes (Signed)
Discharge instructions reviewed with pt and her daughter. Both voice understanding.  

## 2021-04-19 MED FILL — Nitroglycerin IV Soln 100 MCG/ML in D5W: INTRA_ARTERIAL | Qty: 10 | Status: AC

## 2021-04-27 ENCOUNTER — Other Ambulatory Visit: Payer: Self-pay | Admitting: Neurology

## 2021-05-15 ENCOUNTER — Other Ambulatory Visit: Payer: Self-pay | Admitting: Family

## 2021-05-23 ENCOUNTER — Ambulatory Visit (INDEPENDENT_AMBULATORY_CARE_PROVIDER_SITE_OTHER): Payer: Medicare Other | Admitting: Family

## 2021-05-23 ENCOUNTER — Other Ambulatory Visit: Payer: Self-pay

## 2021-05-23 VITALS — BP 115/82 | HR 95 | Temp 98.5°F | Resp 16 | Wt 140.0 lb

## 2021-05-23 DIAGNOSIS — R350 Frequency of micturition: Secondary | ICD-10-CM | POA: Diagnosis not present

## 2021-05-23 DIAGNOSIS — M549 Dorsalgia, unspecified: Secondary | ICD-10-CM

## 2021-05-23 DIAGNOSIS — J019 Acute sinusitis, unspecified: Secondary | ICD-10-CM

## 2021-05-23 MED ORDER — AMOXICILLIN-POT CLAVULANATE 875-125 MG PO TABS: 1.0000 | ORAL_TABLET | Freq: Two times a day (BID) | ORAL | 0 refills | Status: DC

## 2021-05-23 MED ORDER — METHYLPREDNISOLONE 4 MG PO TBPK
ORAL_TABLET | ORAL | 0 refills | Status: DC
Start: 2021-05-23 — End: 2021-06-05

## 2021-05-23 NOTE — Patient Instructions (Addendum)
Please complete lab work prior to leaving. Start medrol dose pack for back pain. Start augmentin for sinus infection. Call if new/worsening symptoms or if symptoms do not improve in 3-4 days.

## 2021-05-23 NOTE — Progress Notes (Signed)
Subjective:     Patient ID: [name], female    DOB: 04-Feb-1956, 65 y.o.   MRN: [id number]  Chief Complaint  Patient presents with   Back Pain    Complains of back pain, more on the left for the past week. Some nausea and vomiting.    Nasal Congestion   Headache    Complains of headaches for about 1 week, "tested negative for covid"    HPI Patient is in today with chief complaint of low back pain. No improvement with muscle relaxers, tylenol and ibuprofen, ice, heat.  Pain is in mid back.  Radiates around the left side.  Reports + "terrible cough" headache, nasal congestion.  She has tested negative for covid 4 times at home. She has had temp tmax 99.  None during the day.   Had MRI 2019-   FINDINGS: Minimal curvature lumbar spine.   L4-5 facet degenerative changes with 5 mm anterior slip L4 and mild L4-5 disc space narrowing. No pars defect noted.   Mild to moderate L5-S1 disc space narrowing.   Mild T12-L1 disc space narrowing.   No fracture noted.   IMPRESSION: L4-5 facet degenerative changes with 5 mm anterior slip L4 and mild L4-5 disc space narrowing. No pars defect noted.   Mild to moderate L5-S1 disc space narrowing.   Mild T12-L1 disc space narrowing.   Notes + bladder pressure, urinary frequency.  Notes that this has been present for some time. Got a little better with OAB med.   Health Maintenance Due  Topic Date Due   COVID-19 Vaccine (1) Never done   Zoster Vaccines- Shingrix (1 of 2) Never done   INFLUENZA VACCINE  04/16/2021    Past Medical History:  Diagnosis Date   Acute midline thoracic back pain 02/16/2021   Allergic rhinitis 07/16/2016   Angina pectoris ([place]) 07/25/2020   Arthritis    B12 deficiency 01/02/2021   Back pain    arthritis   Chronic sinusitis 12/05/2020   Depression    takes cymbalta daily   Diarrhea 02/16/2021   Duodenal stenosis    Epigastric pain 07/29/2018   Essential hypertension 05/14/2019   Gallstones     Generalized abdominal pain 02/16/2021   GERD (gastroesophageal reflux disease)    takes Omeprazole daily   Headache    Heart murmur    Hiatal hernia    History of gastric ulcer    History of migraine    last one about 2wks ago-takes Imitrex prn   History of seizures    Joint pain    Joint swelling    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[place]) 01/13/2015   Meningioma ([place])    Migraine without aura and without status migrainosus, not intractable 01/13/2015   Mixed dyslipidemia 07/25/2020   Myringotomy tube status 02/27/2021   Nausea 02/16/2021   Nocturnal leg cramps 02/20/2015   Obesity (BMI 30-39.9) 07/22/2014   Osteoarthritis of left knee 12/02/2012   Overactive bladder 01/02/2021   Peptic ulcer disease    Physical exam 07/22/2014   Recurrent acute serous otitis media of left ear 12/05/2020   Seizure-like activity ([place]) 05/12/2014    Past Surgical History:  Procedure Laterality Date   BALLOON DILATION  04/03/2012   Procedure: BALLOON DILATION;  Surgeon: Beryle Beams, MD;  [facility];  Service: Endoscopy;  Laterality: N/A;   BIOPSY  08/24/2019   Procedure: BIOPSY;  Surgeon: Lavena Bullion, DO;  [facility];  Service: Gastroenterology;;  EGD and COLON   BRAVO [place] STUDY N/A 08/24/2019   Procedure: BRAVO [place];  Surgeon: Lavena Bullion, DO;  [facility];  Service: Gastroenterology;  Laterality: N/A;   CHOLECYSTECTOMY N/A 04/24/2018   Procedure: LAPAROSCOPIC CHOLECYSTECTOMY;  Surgeon: Clovis Riley, MD;  [facility];  Service: General;  Laterality: N/A;   COLONOSCOPY     COLONOSCOPY WITH PROPOFOL N/A 08/24/2019   Procedure: COLONOSCOPY WITH PROPOFOL;  Surgeon: Lavena Bullion, DO;  [facility];  Service: Gastroenterology;  Laterality: N/A;   DIAGNOSTIC LAPAROSCOPY  48yr ago   ESOPHAGEAL MANOMETRY N/A 09/04/2015   Procedure: ESOPHAGEAL MANOMETRY (EM);  Surgeon: PCarol Ada MD;  [facility];  Service: Endoscopy;  Laterality: N/A;   ESOPHAGOGASTRODUODENOSCOPY  04/03/2012   Procedure: ESOPHAGOGASTRODUODENOSCOPY (EGD);  Surgeon: PBeryle Beams MD;  [facility];  Service: Endoscopy;  Laterality: N/A;  EGD w/ balloon dilation   ESOPHAGOGASTRODUODENOSCOPY (EGD) WITH PROPOFOL N/A 02/18/2014   Procedure: ESOPHAGOGASTRODUODENOSCOPY (EGD) WITH PROPOFOL;  Surgeon: PBeryle Beams MD;  [facility];  Service: Endoscopy;  Laterality: N/A;   ESOPHAGOGASTRODUODENOSCOPY (EGD) WITH PROPOFOL N/A 04/22/2014   Procedure: ESOPHAGOGASTRODUODENOSCOPY (EGD) WITH PROPOFOL;  Surgeon: PBeryle Beams MD;  [facility];  Service: Endoscopy;  Laterality: N/A;   ESOPHAGOGASTRODUODENOSCOPY (EGD) WITH PROPOFOL N/A 08/24/2019   Procedure: ESOPHAGOGASTRODUODENOSCOPY (EGD) WITH PROPOFOL;  Surgeon: CLavena Bullion DO;  [facility];  Service: Gastroenterology;  Laterality: N/A;   LEFT HEART CATH AND CORONARY ANGIOGRAPHY N/A 04/18/2021   Procedure: LEFT HEART CATH AND CORONARY ANGIOGRAPHY;  Surgeon: BLorretta Harp MD;  [facility];  Service: Cardiovascular;  Laterality: N/A;   NASAL SEPTUM SURGERY  09/2016   P[place]EDANCE STUDY N/A 09/04/2015   Procedure: P[place]IMPEDANCE STUDY;  Surgeon: PCarol Ada MD;  [facility];  Service: Endoscopy;  Laterality: N/A;   REPLACEMENT TOTAL KNEE Right 634yrago   TOTAL KNEE ARTHROPLASTY Left 11/30/2012   Procedure: LEFT TOTAL KNEE ARTHROPLASTY;  Surgeon: FrKerin SalenMD;  [facility]: [facility];  Laterality: Left;    Family History  Problem Relation Age of Onset   Arthritis Mother    Cancer Mother        breast   Heart disease Mother        died from "heart problems" at age 440had CAD   Depression Mother    Heart disease Father        had CABG   Kidney disease Father    Arthritis Sister    Heart murmur Sister    Arthritis Brother        "serious back/neck problems"   Arthritis  Sister    Aneurysm Sister    Healthy Daughter    Colon cancer Neg Hx    Esophageal cancer Neg Hx     Social History   Socioeconomic History   Marital status: Widowed    Spouse name: Not on file   Number of children: 1   Years of education: Not on file   Highest education level: Some college, no degree  Occupational History   Occupation: Retired  Tobacco Use   Smoking status: Never   Smokeless tobacco: Never  Vaping Use   Vaping Use: Never used  Substance and Sexual Activity   Alcohol use: No   Drug use: No   Sexual activity: Not Currently    Birth control/protection: Post-menopausal  Other Topics Concern   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, mining   Single   1 daughter- lives in [place]   2 dogs   Enjoys reading, watching television   Pt is right handed   Drinks coffee once a week, green tea daily, soda sometimes    Social Determinants of Radio broadcast assistant Strain: Not on file  Food Insecurity: Not on file  Transportation Needs: Not on file  Physical Activity: Not on file  Stress: Not on file  Social Connections: Not on file  Intimate Partner Violence: Not on file    Outpatient Medications Prior to Visit  Medication Sig Dispense Refill   alendronate (FOSAMAX) 70 MG tablet Take 1 tablet (70 mg total) by mouth every 7 (seven) days. Take with a full glass of water on an empty stomach. (Patient taking differently: Take 70 mg by mouth every Saturday. Take with a full glass of water on an empty stomach.) 4 tablet 11   aluminum-magnesium hydroxide-simethicone (MAALOX) I037812 MG/5ML SUSP Take 30 mLs by mouth daily as needed (indigestion).     amLODipine (NORVASC) 5 MG tablet Take 1 tablet (5 mg total) by mouth daily. 30 tablet 0   aspirin EC 81 MG tablet Take 1 tablet (81 mg total) by mouth daily. Swallow whole. 90 tablet 3   Aspirin-Salicylamide-Caffeine (BC HEADACHE PO) Take 1 packet by  mouth daily as needed for headache (headaches).     azelastine (ASTELIN) 0.1 % nasal spray Place 2 sprays into both nostrils 2 (two) times daily. 30 mL 1   Bioflavonoid Products (VITAMIN C) CHEW Chew 1 tablet by mouth daily.     cyanocobalamin (,VITAMIN B-12,) 1000 MCG/ML injection 1029mg SQ once weekly for 3 more weeks, then once monthly. 15 mL 0   dexlansoprazole (DEXILANT) 60 MG capsule TAKE 1 CAPSULE(60 MG) BY MOUTH DAILY (Patient taking differently: Take 60 mg by mouth daily.) 90 capsule 1   eletriptan (RELPAX) 40 MG tablet TAKE 1 TABLET BY MOUTH AS NEEDED FOR MIGRAINE OR HEADACHE, MAY REPEAT IN 2 HOURS IF HEADACHE PERSISTS OR RECURS 10 tablet 0   fluticasone (FLONASE) 50 MCG/ACT nasal spray Place 2 sprays into both nostrils daily. 48 g 3   MYRBETRIQ 25 MG TB24 tablet TAKE 1 TABLET(25 MG) BY MOUTH DAILY (Patient taking differently: Take 25 mg by mouth daily.) 30 tablet 2   nitroGLYCERIN (NITROSTAT) 0.4 MG SL tablet Place 0.4 mg under the tongue every 5 (five) minutes as needed for chest pain.     ondansetron (ZOFRAN) 4 MG tablet TAKE 1 TABLET(4 MG) BY MOUTH EVERY 8 HOURS AS NEEDED FOR NAUSEA OR VOMITING 20 tablet 0   topiramate (TOPAMAX) 100 MG tablet TAKE 1 TABLET(100 MG) BY MOUTH TWICE DAILY (Patient taking differently: Take 100 mg by mouth 2 (two) times daily.) 180 tablet 1   No facility-administered medications prior to visit.    Allergies  Allergen Reactions   Naratriptan     Ineffective    ROS    See HPI Objective:    Physical Exam Constitutional:      General: She is not in acute distress.    Appearance: Normal appearance. She is well-developed.  HENT:     Head: Normocephalic and atraumatic.     Right Ear: Tympanic membrane, ear canal and external ear normal.     Left Ear: Tympanic membrane, ear canal and external ear normal.     Nose:     Right Sinus: Maxillary  sinus tenderness and frontal sinus tenderness present.     Left Sinus: Maxillary sinus tenderness and  frontal sinus tenderness present.  Eyes:     General: No scleral icterus. Neck:     Thyroid: No thyromegaly.  Cardiovascular:     Rate and Rhythm: Normal rate and regular rhythm.     Heart sounds: Normal heart sounds. No murmur heard. Pulmonary:     Effort: Pulmonary effort is normal. No respiratory distress.     Breath sounds: Normal breath sounds. No wheezing.  Musculoskeletal:     Cervical back: Neck supple.  Skin:    General: Skin is warm and dry.  Neurological:     Mental Status: She is alert and oriented to person, place, and time.  Psychiatric:        Mood and Affect: Mood normal.        Behavior: Behavior normal.        Thought Content: Thought content normal.        Judgment: Judgment normal.    BP 115/82 (BP Location: Right Arm, Patient Position: Sitting, Cuff Size: Small)   Pulse 95   Temp 98.5 F (36.9 C) (Oral)   Resp 16   Wt 140 lb (63.5 kg)   SpO2 100%   BMI 24.03 kg/m  Wt Readings from Last 3 Encounters:  05/23/21 140 lb (63.5 kg)  04/18/21 142 lb (64.4 kg)  04/13/21 143 lb 0.6 oz (64.9 kg)       Assessment & Plan:   Problem List Items Addressed This Visit       Unprioritized   Back pain    Uncontrolled.       Relevant Medications   methylPREDNISolone (MEDROL DOSEPAK) 4 MG TBPK tablet   Acute sinusitis    New. Rx with augmentin.       Relevant Medications   methylPREDNISolone (MEDROL DOSEPAK) 4 MG TBPK tablet   amoxicillin-clavulanate (AUGMENTIN) 875-125 MG tablet   Other Visit Diagnoses     Urinary frequency    -  Primary   Relevant Orders   Urine Culture (Completed)       I am having Devoria Glassing. Capuano start on methylPREDNISolone and amoxicillin-clavulanate. I am also having her maintain her Aspirin-Salicylamide-Caffeine (BC HEADACHE PO), azelastine, cyanocobalamin, amLODipine, alendronate, aspirin EC, fluticasone, topiramate, Myrbetriq, dexlansoprazole, nitroGLYCERIN, Vitamin C, aluminum-magnesium hydroxide-simethicone, eletriptan,  and ondansetron.  Meds ordered this encounter  Medications   methylPREDNISolone (MEDROL DOSEPAK) 4 MG TBPK tablet    Sig: Take per package instructions    Dispense:  21 tablet    Refill:  0    Order Specific Question:   Supervising Provider    Answer:   Penni Homans A [[id number]]   amoxicillin-clavulanate (AUGMENTIN) 875-125 MG tablet    Sig: Take 1 tablet by mouth 2 (two) times daily.    Dispense:  20 tablet    Refill:  0    Order Specific Question:   Supervising Provider    Answer:   Penni Homans A [id number]

## 2021-05-24 LAB — URINE CULTURE
MICRO NUMBER:: 12341355
Result:: NO GROWTH
SPECIMEN QUALITY:: ADEQUATE

## 2021-05-26 NOTE — Assessment & Plan Note (Signed)
Uncontrolled 

## 2021-05-26 NOTE — Assessment & Plan Note (Signed)
New. Rx with augmentin.  

## 2021-05-30 ENCOUNTER — Other Ambulatory Visit: Payer: Self-pay

## 2021-05-31 ENCOUNTER — Ambulatory Visit (INDEPENDENT_AMBULATORY_CARE_PROVIDER_SITE_OTHER): Payer: Medicare Other | Admitting: Cardiology

## 2021-05-31 ENCOUNTER — Other Ambulatory Visit: Payer: Self-pay | Admitting: Neurology

## 2021-05-31 ENCOUNTER — Other Ambulatory Visit: Payer: Self-pay

## 2021-05-31 ENCOUNTER — Encounter: Payer: Self-pay | Admitting: Cardiology

## 2021-05-31 VITALS — BP 134/86 | HR 82 | Ht 64.0 in | Wt 141.1 lb

## 2021-05-31 DIAGNOSIS — I10 Essential (primary) hypertension: Secondary | ICD-10-CM

## 2021-05-31 DIAGNOSIS — E782 Mixed hyperlipidemia: Secondary | ICD-10-CM

## 2021-05-31 NOTE — Progress Notes (Signed)
Cardiology Office Note:    Date:  05/31/2021   ID:  Lindsay Frost, DOB 09/25/1955, MRN [id number]  PCP:  Debbrah Alar, NP  Cardiologist:  Jenean Lindau, MD   Referring MD: Debbrah Alar, NP    ASSESSMENT:    1. Essential hypertension   2. Mixed dyslipidemia    PLAN:    In order of problems listed above:  Primary prevention stressed with the patient.  Importance of compliance with diet medication stressed and she vocalized understanding. Essential hypertension: Blood pressure stable and diet was emphasized. Mixed dyslipidemia: Lipids followed by primary care.  Patient has done well with diet and exercise and promises to continue active lifestyle walking at least 5 days per week, half an hour each day. Patient will now be evaluated for noncardiac chest pain issues such as hiatal hernia.  She will be seen in follow-up appointment on a as needed basis only.  Patient had multiple questions which were answered to her satisfaction.  Coronary angiography report was discussed with her at length.   Medication Adjustments/Labs and Tests Ordered: Current medicines are reviewed at length with the patient today.  Concerns regarding medicines are outlined above.  No orders of the defined types were placed in this encounter.  No orders of the defined types were placed in this encounter.    No chief complaint on file.    History of Present Illness:    Lindsay Frost is a 65 y.o. female.  Patient has past medical history of essential hypertension and dyslipidemia.  She was experiencing chest pain which was very concerning and suggesting of coronary artery disease.  Fortunately coronary angiography was unremarkable and within normal limits.  She is happy about it.  She is now being evaluated for hiatal hernia first.  At the time of my evaluation, the patient is alert awake oriented and in no distress.  Past Medical History:  Diagnosis Date   Acute midline thoracic back pain  02/16/2021   Acute sinusitis 09/05/2014   Allergic rhinitis 07/16/2016   Angina pectoris ([place]) 07/25/2020   Arthritis    B12 deficiency 01/02/2021   Back pain    arthritis   Chronic sinusitis 12/05/2020   Depression    takes cymbalta daily   Diarrhea 02/16/2021   Duodenal stenosis    Epigastric pain 07/29/2018   Essential hypertension 05/14/2019   Gallstones    Generalized abdominal pain 02/16/2021   GERD (gastroesophageal reflux disease)    takes Omeprazole daily   Headache    Heart murmur    Hiatal hernia    History of gastric ulcer    History of migraine    last one about 2wks ago-takes Imitrex prn   History of seizures    Joint pain    Joint swelling    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[place]) 01/13/2015   Meningioma ([place])    Migraine without aura and without status migrainosus, not intractable 01/13/2015   Mixed dyslipidemia 07/25/2020   Myringotomy tube status 02/27/2021   Nausea 02/16/2021   Nocturnal leg cramps 02/20/2015   Obesity (BMI 30-39.9) 07/22/2014   Osteoarthritis of left knee 12/02/2012   Overactive bladder 01/02/2021   Peptic ulcer disease    Physical exam 07/22/2014   Recurrent acute serous otitis media of left ear 12/05/2020   Seizure-like activity ([place]) 05/12/2014    Past Surgical History:  Procedure Laterality Date   BALLOON DILATION  04/03/2012   Procedure: BALLOON DILATION;  Surgeon: Beryle Beams,  MD;  [facility];  Service: Endoscopy;  Laterality: N/A;   BIOPSY  08/24/2019   Procedure: BIOPSY;  Surgeon: Lavena Bullion, DO;  [facility];  Service: Gastroenterology;;  EGD and COLON   BRAVO [place] STUDY N/A 08/24/2019   Procedure: BRAVO [place];  Surgeon: Lavena Bullion, DO;  [facility];  Service: Gastroenterology;  Laterality: N/A;   CHOLECYSTECTOMY N/A 04/24/2018   Procedure: LAPAROSCOPIC CHOLECYSTECTOMY;  Surgeon: Clovis Riley, MD;  [facility];  Service: General;   Laterality: N/A;   COLONOSCOPY     COLONOSCOPY WITH PROPOFOL N/A 08/24/2019   Procedure: COLONOSCOPY WITH PROPOFOL;  Surgeon: Lavena Bullion, DO;  [facility];  Service: Gastroenterology;  Laterality: N/A;   DIAGNOSTIC LAPAROSCOPY  64yr ago   ESOPHAGEAL MANOMETRY N/A 09/04/2015   Procedure: ESOPHAGEAL MANOMETRY (EM);  Surgeon: PCarol Ada MD;  [facility];  Service: Endoscopy;  Laterality: N/A;   ESOPHAGOGASTRODUODENOSCOPY  04/03/2012   Procedure: ESOPHAGOGASTRODUODENOSCOPY (EGD);  Surgeon: PBeryle Beams MD;  [facility];  Service: Endoscopy;  Laterality: N/A;  EGD w/ balloon dilation   ESOPHAGOGASTRODUODENOSCOPY (EGD) WITH PROPOFOL N/A 02/18/2014   Procedure: ESOPHAGOGASTRODUODENOSCOPY (EGD) WITH PROPOFOL;  Surgeon: PBeryle Beams MD;  [facility];  Service: Endoscopy;  Laterality: N/A;   ESOPHAGOGASTRODUODENOSCOPY (EGD) WITH PROPOFOL N/A 04/22/2014   Procedure: ESOPHAGOGASTRODUODENOSCOPY (EGD) WITH PROPOFOL;  Surgeon: PBeryle Beams MD;  [facility];  Service: Endoscopy;  Laterality: N/A;   ESOPHAGOGASTRODUODENOSCOPY (EGD) WITH PROPOFOL N/A 08/24/2019   Procedure: ESOPHAGOGASTRODUODENOSCOPY (EGD) WITH PROPOFOL;  Surgeon: CLavena Bullion DO;  [facility];  Service: Gastroenterology;  Laterality: N/A;   LEFT HEART CATH AND CORONARY ANGIOGRAPHY N/A 04/18/2021   Procedure: LEFT HEART CATH AND CORONARY ANGIOGRAPHY;  Surgeon: BLorretta Harp MD;  [facility];  Service: Cardiovascular;  Laterality: N/A;   NASAL SEPTUM SURGERY  09/2016   P[place]EDANCE STUDY N/A 09/04/2015   Procedure: [place]ED[place] STUDY;  Surgeon: PCarol Ada MD;  [facility];  Service: Endoscopy;  Laterality: N/A;   REPLACEMENT TOTAL KNEE Right 638yrago   TOTAL KNEE ARTHROPLASTY Left 11/30/2012   Procedure: LEFT TOTAL KNEE ARTHROPLASTY;  Surgeon: FrKerin SalenMD;  [facility];  Laterality: Left;     Current Medications: Current Meds  Medication Sig   alendronate (FOSAMAX) 70 MG tablet Take 1 tablet (70 mg total) by mouth every 7 (seven) days. Take with a full glass of water on an empty stomach.   aluminum-magnesium hydroxide-simethicone (MAALOX) 20I037812G/5ML SUSP Take 30 mLs by mouth daily as needed for indigestion (indigestion).   amLODipine (NORVASC) 5 MG tablet Take 1 tablet (5 mg total) by mouth daily.   amoxicillin-clavulanate (AUGMENTIN) 875-125 MG tablet Take 1 tablet by mouth 2 (two) times daily.   aspirin EC 81 MG tablet Take 1 tablet (81 mg total) by mouth daily. Swallow whole.   Aspirin-Salicylamide-Caffeine (BC HEADACHE PO) Take 1 packet by mouth daily as needed for headache (headaches).   azelastine (ASTELIN) 0.1 % nasal spray Place 2 sprays into both nostrils 2 (two) times daily.   Bioflavonoid Products (VITAMIN C) CHEW Chew 1 tablet by mouth daily.   dexlansoprazole (DEXILANT) 60 MG capsule TAKE 1 CAPSULE(60 MG) BY MOUTH DAILY   eletriptan (RELPAX) 40 MG tablet TAKE 1 TABLET BY MOUTH AS NEEDED FOR MIGRAINE OR HEADACHE AND MAY REPEAT IN 2 HOURS IF HEADACHE PERSISTS OR RECURS   fluticasone (FLONASE)  50 MCG/ACT nasal spray Place 2 sprays into both nostrils daily.   methylPREDNISolone (MEDROL DOSEPAK) 4 MG TBPK tablet Take per package instructions   MYRBETRIQ 25 MG TB24 tablet TAKE 1 TABLET(25 MG) BY MOUTH DAILY   nitroGLYCERIN (NITROSTAT) 0.4 MG SL tablet Place 0.4 mg under the tongue every 5 (five) minutes as needed for chest pain.   ondansetron (ZOFRAN) 4 MG tablet TAKE 1 TABLET(4 MG) BY MOUTH EVERY 8 HOURS AS NEEDED FOR NAUSEA OR VOMITING   topiramate (TOPAMAX) 100 MG tablet TAKE 1 TABLET(100 MG) BY MOUTH TWICE DAILY     Allergies:   Naratriptan   Social History   Socioeconomic History   Marital status: Widowed    Spouse name: Not on file   Number of children: 1   Years of education: Not on file   Highest education level: Some college, no degree   Occupational History   Occupation: Retired  Tobacco Use   Smoking status: Never   Smokeless tobacco: Never  Vaping Use   Vaping Use: Never used  Substance and Sexual Activity   Alcohol use: No   Drug use: No   Sexual activity: Not Currently    Birth control/protection: Post-menopausal  Other Topics Concern   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, mining   Single   1 daughter- lives in [place]   2 dogs   Enjoys reading, watching television   Pt is right handed   Drinks coffee once a week, green tea daily, soda sometimes    Social Determinants of Radio broadcast assistant Strain: Not on file  Food Insecurity: Not on file  Transportation Needs: Not on file  Physical Activity: Not on file  Stress: Not on file  Social Connections: Not on file     Family History: The patient's family history includes Aneurysm in her sister; Arthritis in her brother, mother, sister, and sister; Cancer in her mother; Depression in her mother; Healthy in her daughter; Heart disease in her father and mother; Heart murmur in her sister; Kidney disease in her father. There is no history of Colon cancer or Esophageal cancer.  ROS:   Please see the history of present illness.    All other systems reviewed and are negative.  EKGs/Labs/Other Studies Reviewed:    The following studies were reviewed today: I discussed my findings with the patient at length.  Coronary angiography report was discussed with her.  It was within normal limits.   Recent Labs: 02/16/2021: ALT 11 04/13/2021: BUN 15; Creatinine, Ser 1.10; Hemoglobin 13.3; Platelets 369; Potassium 4.1; Sodium 144  Recent Lipid Panel    Component Value Date/Time   CHOL 246 (H) 01/07/2017 1115   TRIG 85.0 01/07/2017 1115   HDL 55.30 01/07/2017 1115   CHOLHDL 4 01/07/2017 1115   VLDL 17.0 01/07/2017 1115   LDLCALC 173 (H) 01/07/2017 1115    Physical Exam:    VS:  BP 134/86    Pulse 82   Ht '5\' 4"'$  (1.626 m)   Wt 141 lb 1.3 oz (64 kg)   SpO2 97%   BMI 24.22 kg/m     Wt Readings from Last 3 Encounters:  05/31/21 141 lb 1.3 oz (64 kg)  05/23/21 140 lb (63.5 kg)  04/18/21 142 lb (64.4 kg)     GEN: Patient is in no acute distress HEENT: Normal NECK: No JVD; No carotid bruits LYMPHATICS: No lymphadenopathy CARDIAC: Hear sounds  regular, 2/6 systolic murmur at the apex. RESPIRATORY:  Clear to auscultation without rales, wheezing or rhonchi  ABDOMEN: Soft, non-tender, non-distended MUSCULOSKELETAL:  No edema; No deformity  SKIN: Warm and dry NEUROLOGIC:  Alert and oriented x 3 PSYCHIATRIC:  Normal affect   Signed, Jenean Lindau, MD  05/31/2021 3:31 PM    [facility]

## 2021-05-31 NOTE — Patient Instructions (Signed)

## 2021-06-05 ENCOUNTER — Encounter: Payer: Self-pay | Admitting: Gastroenterology

## 2021-06-05 ENCOUNTER — Other Ambulatory Visit: Payer: Self-pay

## 2021-06-05 ENCOUNTER — Ambulatory Visit (INDEPENDENT_AMBULATORY_CARE_PROVIDER_SITE_OTHER): Payer: Medicare Other | Admitting: Gastroenterology

## 2021-06-05 ENCOUNTER — Other Ambulatory Visit (INDEPENDENT_AMBULATORY_CARE_PROVIDER_SITE_OTHER): Payer: Medicare Other

## 2021-06-05 ENCOUNTER — Other Ambulatory Visit: Payer: Self-pay | Admitting: Family

## 2021-06-05 VITALS — BP 130/80 | HR 70 | Ht 64.0 in | Wt 139.5 lb

## 2021-06-05 DIAGNOSIS — R1010 Upper abdominal pain, unspecified: Secondary | ICD-10-CM

## 2021-06-05 DIAGNOSIS — K219 Gastro-esophageal reflux disease without esophagitis: Secondary | ICD-10-CM | POA: Diagnosis not present

## 2021-06-05 DIAGNOSIS — R195 Other fecal abnormalities: Secondary | ICD-10-CM

## 2021-06-05 DIAGNOSIS — K6389 Other specified diseases of intestine: Secondary | ICD-10-CM

## 2021-06-05 DIAGNOSIS — R112 Nausea with vomiting, unspecified: Secondary | ICD-10-CM | POA: Diagnosis not present

## 2021-06-05 DIAGNOSIS — R110 Nausea: Secondary | ICD-10-CM | POA: Diagnosis not present

## 2021-06-05 DIAGNOSIS — K315 Obstruction of duodenum: Secondary | ICD-10-CM

## 2021-06-05 DIAGNOSIS — R140 Abdominal distension (gaseous): Secondary | ICD-10-CM | POA: Diagnosis not present

## 2021-06-05 DIAGNOSIS — K449 Diaphragmatic hernia without obstruction or gangrene: Secondary | ICD-10-CM

## 2021-06-05 LAB — VITAMIN D 25 HYDROXY (VIT D DEFICIENCY, FRACTURES): VITD: 17.62 ng/mL — ABNORMAL LOW (ref 30.00–100.00)

## 2021-06-05 LAB — B12 AND FOLATE PANEL
Folate: >23.4 ng/mL (ref >5.9)
Vitamin B-12: 388 pg/mL (ref 211–911)

## 2021-06-05 MED ORDER — RIFAXIMIN 550 MG PO TABS: 550.0000 mg | ORAL_TABLET | Freq: Two times a day (BID) | ORAL | 1 refills | Status: AC

## 2021-06-05 MED ORDER — HYOSCYAMINE SULFATE 0.125 MG PO TABS: 0.1250 mg | ORAL_TABLET | Freq: Four times a day (QID) | ORAL | 3 refills | Status: DC | PRN

## 2021-06-05 NOTE — Patient Instructions (Signed)
If you are age 65 or older, your body mass index should be between 23-30. Your Body mass index is 23.95 kg/m. If this is out of the aforementioned range listed, please consider follow up with your Primary Care Provider.  If you are age 63 or younger, your body mass index should be between 19-25. Your Body mass index is 23.95 kg/m. If this is out of the aformentioned range listed, please consider follow up with your Primary Care Provider.   __________________________________________________________  The [facility] providers would like to encourage you to use [facility] to communicate with providers for non-urgent requests or questions.  Due to long hold times on the telephone, sending your provider a message by [facility] may be a faster and more efficient way to get a response.  Please allow 48 business hours for a response.  Please remember that this is for non-urgent requests.    ___________________________________________________________  Please go to the lab on the 2nd floor [address] before you leave the office today.    We have sent the following medications to your pharmacy for you to pick up at your convenience:  Rifaximin and levsin  Due to recent changes in healthcare laws, you may see the results of your imaging and laboratory studies on MyChart before your provider has had a chance to review them.  We understand that in some cases there may be results that are confusing or concerning to you. Not all laboratory results come back in the same time frame and the provider may be waiting for multiple results in order to interpret others.  Please give [place] 48 hours in order for your provider to thoroughly review all the results before contacting the office for clarification of your results.   Thank you for choosing me and [facility].  Vito Cirigliano, D.O.

## 2021-06-05 NOTE — Progress Notes (Signed)
Chief Complaint:    Epigastric pain, GERD  GI History: Lindsay Frost is a 65 y.o. female follows in the Gastroenterology Clinic for multiple GI symptoms, to include reflux, lower abdominal pain, and change in bowel habits.   She describes a longstanding history of reflux, characterized by HB, regurgitation, sourbrash. Intermittent dry cough. +dysphagia for the last few years, pointing to lower sternal border.  Symptoms started worsening in 2020; increased postprandial and nocturnal HB. Worse with fruit, spicy food.  Has taken Dexilant 60 mg/day for years.  Takes Maalox for breakthrough sxs.  Has previously trialed Carafate, Tums, Prilosec.    Previously followed by Dr. 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   Additionally, she endorses changes in bowel habits, alternating between diarrhea and constipation since mid 2020. No preceding change in medications, diet, or activity. Intermittent BRB and marroon colored blood mixed in stool.  Intermittent lower abdominal cramping.  Not reliably improved with BM. Trialed stool softeners without change.    - 04/2018: Cholecystectomy -08/2018: CT abd/pelvis without intraabodminal process. - 2019: MRI brain: Benign hemangioma.  Patient reports some abnormality on prior spine imaging, and has been following with a spine surgeon. - 08/2020: Normal/negative GI PCR panel, C. difficile, CBC - 02/2021: GI PCR panel normal/negative.  Normal CBC, CMP, lipase      Endoscopic history: - UGI series (11/30/2019): Small HH, nonspecific esophageal motility disorder.  Slightly irregular duodenal bulb/proximal duodenum suggesting scarring from prior ulcer disease -EGD with Bravo (08/2019, Dr. Bryan Lemma; on PPI): 3 cm HH, Hill grade 3 valve, LA Grade A esophagitis, mild non-H. pylori gastritis with single ulcer in antrum (biopsy: Gastritis with intestinal metaplasia), duodenal bulb diverticulum, stenosis  in duodenal bulb dilated with endoscope alone; declines repeat EGD with GIM mapping -Bravo results (08/2019 on [name]): Significant esophageal acid exposure, DeMeester score 24.5, pH <4 9.6% of time.  Significant reflux despite Dexilant -Colonoscopy (08/2019, Dr. Bryan Lemma): Sigmoid diverticulosis, moderate stool, biopsies negative for [facility].  Repeat in 3 years due to suboptimal prep -EGD (07/2018, Dr. Benson Norway): Normal esophagus, 4 gastric ulcers, 1 duodenal ulcer, antral/duodenal bulb deformity with pyloric stenosis but easily traversable -EGD (12/2015, Dr. Benson Norway): LA Grade D esophagitis, 3 cm HH, 2 gastric ulcers, gastritis, mild duodenal stenosis.  Started on Dexilant - EGD (04/2014, Dr. Benson Norway): 3 cm HH, healing antral ulcer, duodenal bulb deformity with nonobstructive stricture -EGD (02/2014, Dr. Benson Norway): 3 cm HH, multiple clean-based ulcers in the antrum, posterior duodenal bulb stenosis but traversable -EGD (03/2012, Dr. Benson Norway): Antral ulcers, post bulbar stricture, dilated with 15 mm TTS balloon with mucosal rent -EGD (03/2012, Dr. Benson Norway): Antral ulcers, post bulbar stricture which was not traversed, unsuccessful balloon dilation -Colonoscopy (07/2010, Dr. Benson Norway): Normal.  Repeat 10 years  HPI:     Patient is a 65 y.o. female presenting to the Gastroenterology Clinic for follow-up.  Last seen by me via virtual appointment on 09/28/2019.  At that time recommended referral to [facility] for consideration of HHR and fundoplication vs bypass (due to post bulbar duodenal stenosis) for continued reflux esophagitis and positive Bravo despite Dexilant.  Not a TIF candidate.  Serum gastrin recommended- not done.   Main issue today is increasing epigastric pain and reflux sxs. Sxs present intermittently for the last 2 months.  Pain radiates to the left flank/back. Does have associated nausea w/ occasional emesis.  No improvement in pain or emesis with empiric trial of her migraine medication.  No change  with trials of Motrin,  APAP, Tums. Sxs last a few days, then recur a week or 2 later.   Can have associated abdominal bloating and loose, not watery stools.  No hematochezia or melena. Trialed some Imodium w/o change.   Had heart cath in August which was normal.   Reflux symptoms still suboptimally controlled despite Dexilant 60 mg/day.  Breakthrough heartburn.  No recent dysphagia.  Has started taking Maalox before every meal without much improvement.  She met with Dr. Redmond Pulling, and completed UGI series as above, but patient never went for f/u.   She presents to the clinic with her daughter.  Review of systems:     No chest pain, no SOB, no fevers, no urinary sx   Past Medical History:  Diagnosis Date   Acute midline thoracic back pain 02/16/2021   Acute sinusitis 09/05/2014   Allergic rhinitis 07/16/2016   Angina pectoris ([place]) 07/25/2020   Arthritis    B12 deficiency 01/02/2021   Back pain    arthritis   Chronic sinusitis 12/05/2020   Depression    takes cymbalta daily   Diarrhea 02/16/2021   Duodenal stenosis    Epigastric pain 07/29/2018   Essential hypertension 05/14/2019   Gallstones    Generalized abdominal pain 02/16/2021   GERD (gastroesophageal reflux disease)    takes Omeprazole daily   Headache    Heart murmur    Hiatal hernia    History of gastric ulcer    History of migraine    last one about 2wks ago-takes Imitrex prn   History of seizures    Joint pain    Joint swelling    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   Meningioma ([place])    Migraine without aura and without status migrainosus, not intractable 01/13/2015   Mixed dyslipidemia 07/25/2020   Myringotomy tube status 02/27/2021   Nausea 02/16/2021   Nocturnal leg cramps 02/20/2015   Obesity (BMI 30-39.9) 07/22/2014   Osteoarthritis of left knee 12/02/2012   Overactive bladder 01/02/2021   Peptic ulcer disease    Physical exam 07/22/2014   Recurrent acute serous otitis media of left ear  12/05/2020   Seizure-like activity ([place]) 05/12/2014    Patient's surgical history, family medical history, social history, medications and allergies were all reviewed in Epic    Current Outpatient Medications  Medication Sig Dispense Refill   alendronate (FOSAMAX) 70 MG tablet Take 1 tablet (70 mg total) by mouth every 7 (seven) days. Take with a full glass of water on an empty stomach. 4 tablet 11   aluminum-magnesium hydroxide-simethicone (MAALOX) 161-096-04 MG/5ML SUSP Take 30 mLs by mouth daily as needed for indigestion (indigestion).     amLODipine (NORVASC) 5 MG tablet TAKE 1 TABLET(5 MG) BY MOUTH DAILY 90 tablet 0   aspirin EC 81 MG tablet Take 1 tablet (81 mg total) by mouth daily. Swallow whole. 90 tablet 3   Aspirin-Salicylamide-Caffeine (BC HEADACHE PO) Take 1 packet by mouth daily as needed for headache (headaches).     azelastine (ASTELIN) 0.1 % nasal spray Place 2 sprays into both nostrils 2 (two) times daily. 30 mL 1   Bioflavonoid Products (VITAMIN C) CHEW Chew 1 tablet by mouth daily.     dexlansoprazole (DEXILANT) 60 MG capsule TAKE 1 CAPSULE(60 MG) BY MOUTH DAILY 90 capsule 1   eletriptan (RELPAX) 40 MG tablet TAKE 1 TABLET BY MOUTH AS NEEDED FOR MIGRAINE OR HEADACHE AND MAY REPEAT IN 2 HOURS IF HEADACHE PERSISTS OR  RECURS 10 tablet 5   fluticasone (FLONASE) 50 MCG/ACT nasal spray Place 2 sprays into both nostrils daily. 48 g 3   MYRBETRIQ 25 MG TB24 tablet TAKE 1 TABLET(25 MG) BY MOUTH DAILY 30 tablet 2   nitroGLYCERIN (NITROSTAT) 0.4 MG SL tablet Place 0.4 mg under the tongue every 5 (five) minutes as needed for chest pain.     ondansetron (ZOFRAN) 4 MG tablet TAKE 1 TABLET(4 MG) BY MOUTH EVERY 8 HOURS AS NEEDED FOR NAUSEA OR VOMITING 20 tablet 0   topiramate (TOPAMAX) 100 MG tablet TAKE 1 TABLET(100 MG) BY MOUTH TWICE DAILY 180 tablet 1   No current facility-administered medications for this visit.    Physical Exam:     BP 130/80   Pulse 70   Ht 5' 4"  (1.626 m)    Wt 139 lb 8 oz (63.3 kg)   SpO2 95%   BMI 23.95 kg/m   GENERAL:  Pleasant female in NAD PSYCH: : Cooperative, normal affect Musculoskeletal:  Normal muscle tone, normal strength NEURO: Alert and oriented x 3, no focal neurologic deficits   IMPRESSION and PLAN:    1) GERD 2) Hiatal hernia - Prior EGD was erosive esophagitis pain positive Bravo despite continued Dexilant, consistent with breakthrough. - Check fasting serum gastrin level.  Expect this to be elevated in the setting of PPI use, but if significantly elevated (> 1000) should prompt further evaluation for possible gastroparesis tumor. - Plan for increasing Dexilant to 60 mg BID after completion of rifaximin course as below - Continue antireflux lifestyle/dietary modifications - Encouraged follow-up with Dr. Redmond Pulling to discuss alternate surgical options, to include - Discussed history of duodenal stenosis with relation to exacerbation of reflux - Check B12, folate, vitamin D, iron panel while on chronic PPI  3) Upper abdominal pain 4) Nausea with episodic emesis 5) Bloating 6) Change in bowel habits - Discussed broad DDx, to include some overlapping reflux symptoms, but as well as possible SIBO, particular with her prolonged ABX exposure - Trial course of rifaximin 550 mg 3 times daily x14 days, RF 1 - Start probiotics and resume at least 4 weeks beyond completion of rifaximin - Trial [name]  RTC in 3 months or sooner as needed          Lavena Bullion ,DO, FACG 06/05/2021, 10:49 AM

## 2021-06-06 LAB — IRON,TIBC AND FERRITIN PANEL
%SAT: 16 % (calc) (ref 16–45)
Ferritin: 11 ng/mL — ABNORMAL LOW (ref 16–288)
Iron: 65 ug/dL (ref 45–160)
TIBC: 403 mcg/dL (calc) (ref 250–450)

## 2021-06-07 ENCOUNTER — Ambulatory Visit: Payer: Medicare Other | Admitting: Gastroenterology

## 2021-06-07 LAB — GASTRIN: Gastrin: 86 pg/mL (ref <=100)

## 2021-06-08 ENCOUNTER — Telehealth: Payer: Self-pay | Admitting: Family

## 2021-06-08 ENCOUNTER — Telehealth: Payer: Self-pay | Admitting: Gastroenterology

## 2021-06-08 DIAGNOSIS — E559 Vitamin D deficiency, unspecified: Secondary | ICD-10-CM

## 2021-06-08 DIAGNOSIS — D509 Iron deficiency anemia, unspecified: Secondary | ICD-10-CM

## 2021-06-08 NOTE — Telephone Encounter (Signed)
Called the patient regarding the PA for her zifaxin, her insurance denied coverage and I sent an appeal to them this morning. The patient verbalized understanding and stated if we are not able to get it covered she would like an alternative medication do to its cost.

## 2021-06-08 NOTE — Telephone Encounter (Signed)
Left message for patient to call back and schedule Medicare Annual Wellness Visit (AWV) in office.   If not able to come in office, please offer to do virtually or by telephone.  Left office number and my jabber [phone].  Last AWV:01/07/2017  Please schedule at anytime with Nurse Health Advisor.

## 2021-06-08 NOTE — Telephone Encounter (Signed)
Pt called asking about status of PA for [name]. She stated that her pharmacy told her that they were waiting from a response from [place].

## 2021-06-10 ENCOUNTER — Other Ambulatory Visit: Payer: Self-pay | Admitting: Neurology

## 2021-06-11 MED ORDER — VITAMIN D (ERGOCALCIFEROL) 1.25 MG (50000 UNIT) PO CAPS: 50000.0000 [IU] | ORAL_CAPSULE | ORAL | 0 refills | Status: AC

## 2021-06-11 NOTE — Telephone Encounter (Signed)
-----   Message from [place], DO sent at 06/08/2021  4:43 PM EDT ----- Labs notable for the following: - Normal gastrin level.  No suspicion for gastrin (stomach acid) producing lesion -Ferritin was low at 11, with otherwise normal iron indices.  This typically represents early, mild iron deficiency - Vitamin D deficient at 17.6 - Vitamin B-12 and folate are normal  Plan for the following: - Start Ergocalciferol 50,000 units weekly.  Take 1 tablet every week for 8 weeks, #8, RF0 - Following completion of ergocalciferol, start vitamin D daily supplement 2000 IU daily  -Start ferrous sulfate 325 mg p.o. daily.  Take with vitamin C or orange juice.  If intolerant to daily dosing, okay to take every other day. -Take iron supplement 2 hours before or 4 hours after PPI or other antacids as this requires some level of gastric acidity to aid in absorption -Repeat iron panel, vitamin D, and CBC in 3 months

## 2021-06-11 NOTE — Telephone Encounter (Signed)
Spoke with the patient regarding her lab results. She is aware that the gastrin level was normal, ferritin was low with normal iron indicies, normal B12 and folate. She was also advised of a low Vitamin D level. Rx sent to pharmacy, sent my chart message and labs put in computer for 3 months.

## 2021-06-18 ENCOUNTER — Other Ambulatory Visit: Payer: Self-pay | Admitting: Family

## 2021-06-25 DIAGNOSIS — M47818 Spondylosis without myelopathy or radiculopathy, sacral and sacrococcygeal region: Secondary | ICD-10-CM | POA: Diagnosis not present

## 2021-06-26 ENCOUNTER — Telehealth: Payer: Self-pay | Admitting: Gastroenterology

## 2021-06-26 MED ORDER — CHOLESTYRAMINE 4 G PO PACK: PACK | ORAL | 1 refills | Status: DC

## 2021-06-26 NOTE — Telephone Encounter (Signed)
It is possible that the symptoms she is now describing are 2/2 bile salt diarrhea after cholecystectomy.  Plan for the following:  - Start cholestyramine 2 g/day.  If no significant improvement after 7 days, increase to 4 g/day, and potentially increase to 4 g BID pending response - If no appreciable improvement to trial of cholestyramine, will then re-submit for approval for [name] for treatment of IBS-D

## 2021-06-26 NOTE — Telephone Encounter (Signed)
Spoke with patient, she states that she is still dealing with diarrhea. Pt reports that she had 12 episodes of diarrhea yesterday. Pt describes as passing small volumes of watery diarrhea. Pt reports that if she eats or drinks anything within 5 minutes she will have diarrhea. She has tried Pepto-bismol and Imodium with no relief. Pt continues [name] PRN with some relief of abdominal cramping but not diarrhea. Pt reports that she is taking a daily OTC probiotic. Pt reports that she received a letter from her insurance stating that the appeal for [name] was denied. Pt seeking an alternative medication at this time. Please advise, thanks.  Olivia Mackie, not sure if you have seen any correspondences about [name] appeal.

## 2021-06-26 NOTE — Telephone Encounter (Signed)
Spoke with patient in regards to recommendations. Pt is aware that RX has been sent to pharmacy on file. Pt has been advised to contact us if she has not seen any improvement after trial of Cholestyramine. Pt verbalized understanding and had no concerns at the end of the call.

## 2021-06-26 NOTE — Telephone Encounter (Signed)
Patient called states she is still not feeling well seeking further advise.

## 2021-07-09 ENCOUNTER — Telehealth: Payer: Self-pay | Admitting: Gastroenterology

## 2021-07-09 NOTE — Telephone Encounter (Signed)
Spoke with patient, she states that she had epigastric pain over the weekend and stayed in bed. Pt states that she tried to drink root beer and crackers last week and vomited that up within minutes. Pt reports that she did see a little bit of blood. Advised patient that the vomiting could have irritated her esophagus which caused some bleeding. Insurance did not approve [name]. Advised patient to increase her Dexilant to BID (per last office note), continue to follow anti-reflux diet, take [name] as prescribed, and follow a bland diet for the next few days. Pt has a follow up with Dr. Bryan Lemma on 07/19/21. Pt verbalized understanding and had no concerns at the end of the call.

## 2021-07-09 NOTE — Telephone Encounter (Signed)
Patient called requesting to speak with a nurse regarding her Jerrye Bushy getting worst over the weekend she threw up and it looked like there was some blood. Seeking advise.

## 2021-07-18 ENCOUNTER — Other Ambulatory Visit: Payer: Self-pay | Admitting: Family

## 2021-07-19 ENCOUNTER — Ambulatory Visit (INDEPENDENT_AMBULATORY_CARE_PROVIDER_SITE_OTHER): Payer: Medicare Other | Admitting: Gastroenterology

## 2021-07-19 ENCOUNTER — Other Ambulatory Visit: Payer: Self-pay

## 2021-07-19 ENCOUNTER — Encounter: Payer: Self-pay | Admitting: Gastroenterology

## 2021-07-19 VITALS — BP 128/82 | HR 85 | Ht 64.0 in | Wt 132.0 lb

## 2021-07-19 DIAGNOSIS — D509 Iron deficiency anemia, unspecified: Secondary | ICD-10-CM | POA: Diagnosis not present

## 2021-07-19 DIAGNOSIS — K6389 Other specified diseases of intestine: Secondary | ICD-10-CM

## 2021-07-19 DIAGNOSIS — R195 Other fecal abnormalities: Secondary | ICD-10-CM

## 2021-07-19 DIAGNOSIS — K449 Diaphragmatic hernia without obstruction or gangrene: Secondary | ICD-10-CM | POA: Diagnosis not present

## 2021-07-19 DIAGNOSIS — K219 Gastro-esophageal reflux disease without esophagitis: Secondary | ICD-10-CM | POA: Diagnosis not present

## 2021-07-19 DIAGNOSIS — K9089 Other intestinal malabsorption: Secondary | ICD-10-CM

## 2021-07-19 MED ORDER — CHOLESTYRAMINE 4 G PO PACK: 4.0000 g | PACK | Freq: Two times a day (BID) | ORAL | 3 refills | Status: DC

## 2021-07-19 NOTE — Progress Notes (Signed)
Chief Complaint:    Diarrhea  GI History: Lindsay Frost is a 65 y.o. female follows in the [facility]ology Clinic for multiple GI symptoms, to include reflux, lower abdominal pain, and change in bowel habits.   She describes a longstanding history of reflux, characterized by HB, regurgitation, sourbrash. Intermittent dry cough. +dysphagia for the last few years, pointing to lower sternal border.  Symptoms started worsening in 2020; increased postprandial and nocturnal HB. Worse with fruit, spicy food.  Has taken Dexilant 60 mg/day for years.  Takes Maalox for breakthrough sxs.  Has previously trialed Carafate, Tums, Prilosec.    Previously followed by Dr. 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   Additionally, she endorses changes in bowel habits, alternating between diarrhea and constipation since mid 2020. No preceding change in medications, diet, or activity. Intermittent BRB and marroon colored blood mixed in stool.  Intermittent lower abdominal cramping.  Not reliably improved with BM. Trialed stool softeners without change.    - 04/2018: Cholecystectomy -08/2018: CT abd/pelvis without intraabodminal process. - 2019: MRI brain: Benign hemangioma.  Patient reports some abnormality on prior spine imaging, and has been following with a spine surgeon. - 08/2020: Normal/negative GI PCR panel, C. difficile, CBC - 02/2021: GI PCR panel normal/negative.  Normal CBC, CMP, lipase - 05/2021: Normal gastrin level       Endoscopic history: - UGI series (11/30/2019): Small HH, nonspecific esophageal motility disorder.  Slightly irregular duodenal bulb/proximal duodenum suggesting scarring from prior ulcer disease -EGD with Bravo (08/2019, Dr. Bryan Lemma; on PPI): 3 cm HH, Hill grade 3 valve, LA Grade A esophagitis, mild non-H. pylori gastritis with single ulcer in antrum (biopsy: Gastritis with intestinal metaplasia), duodenal bulb  diverticulum, stenosis in duodenal bulb dilated with endoscope alone; declines repeat EGD with GIM mapping -Bravo results (08/2019 on [place]): Significant esophageal acid exposure, DeMeester score 24.5, pH <4 9.6% of time.  Significant reflux despite Dexilant -Colonoscopy (08/2019, Dr. Bryan Lemma): Sigmoid diverticulosis, moderate stool, biopsies negative for [facility].  Repeat in 3 years due to suboptimal prep -EGD (07/2018, Dr. Benson Norway): Normal esophagus, 4 gastric ulcers, 1 duodenal ulcer, antral/duodenal bulb deformity with pyloric stenosis but easily traversable -EGD (12/2015, Dr. Benson Norway): LA Grade D esophagitis, 3 cm HH, 2 gastric ulcers, gastritis, mild duodenal stenosis.  Started on Dexilant - EGD (04/2014, Dr. Benson Norway): 3 cm HH, healing antral ulcer, duodenal bulb deformity with nonobstructive stricture -EGD (02/2014, Dr. Benson Norway): 3 cm HH, multiple clean-based ulcers in the antrum, posterior duodenal bulb stenosis but traversable -EGD (03/2012, Dr. Benson Norway): Antral ulcers, post bulbar stricture, dilated with 15 mm TTS balloon with mucosal rent -EGD (03/2012, Dr. Benson Norway): Antral ulcers, post bulbar stricture which was not traversed, unsuccessful balloon dilation -Colonoscopy (07/2010, Dr. Benson Norway): Normal.  Repeat 10 years  HPI:     Patient is a 65 y.o. female presenting to the Gastroenterology Clinic for follow-up.  Was last seen by me on 06/05/2021.  At that time he initially was epigastric pain and reflux symptoms despite Dexilant. Increased Dexilant to 60 mg bid.    Was also having abdominal bloating and loose, but not watery stools.  Was prescribed rifaximin, but this was not covered by insurance/cost prohibitive.  Was instead started on cholestyramine along with probiotics.    Was also started on vitamin D and iron for deficiency on labs.  Main issue today is continued loose stools.  Has started taking Questran with improvement in stools, but still with breakthrough  loose stools at night. Taking 1 packet/day (4  gm).   [name] helps with abdomoinal pain.   Reflux much improved with BID dosing.   She again presents with her daughter to the clinic.  Review of systems:     No chest pain, no SOB, no fevers, no urinary sx   Past Medical History:  Diagnosis Date   Acute midline thoracic back pain 02/16/2021   Acute sinusitis 09/05/2014   Allergic rhinitis 07/16/2016   Angina pectoris ([place]) 07/25/2020   Arthritis    B12 deficiency 01/02/2021   Back pain    arthritis   Chronic sinusitis 12/05/2020   Depression    takes cymbalta daily   Diarrhea 02/16/2021   Duodenal stenosis    Epigastric pain 07/29/2018   Essential hypertension 05/14/2019   Gallstones    Generalized abdominal pain 02/16/2021   GERD (gastroesophageal reflux disease)    takes Omeprazole daily   Headache    Heart murmur    Hiatal hernia    History of gastric ulcer    History of migraine    last one about 2wks ago-takes Imitrex prn   History of seizures    Joint pain    Joint swelling    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   Meningioma ([place])    Migraine without aura and without status migrainosus, not intractable 01/13/2015   Mixed dyslipidemia 07/25/2020   Myringotomy tube status 02/27/2021   Nausea 02/16/2021   Nocturnal leg cramps 02/20/2015   Obesity (BMI 30-39.9) 07/22/2014   Osteoarthritis of left knee 12/02/2012   Overactive bladder 01/02/2021   Peptic ulcer disease    Physical exam 07/22/2014   Recurrent acute serous otitis media of left ear 12/05/2020   Seizure-like activity ([place]) 05/12/2014    Patient's surgical history, family medical history, social history, medications and allergies were all reviewed in Epic    Current Outpatient Medications  Medication Sig Dispense Refill   alendronate (FOSAMAX) 70 MG tablet Take 1 tablet (70 mg total) by mouth every 7 (seven) days. Take with a full glass of water on an empty stomach. 4 tablet 11   aluminum-magnesium  hydroxide-simethicone (MAALOX) 517-001-74 MG/5ML SUSP Take 30 mLs by mouth daily as needed for indigestion (indigestion).     amLODipine (NORVASC) 5 MG tablet TAKE 1 TABLET(5 MG) BY MOUTH DAILY 90 tablet 0   aspirin EC 81 MG tablet Take 1 tablet (81 mg total) by mouth daily. Swallow whole. 90 tablet 3   Aspirin-Salicylamide-Caffeine (BC HEADACHE PO) Take 1 packet by mouth daily as needed for headache (headaches).     azelastine (ASTELIN) 0.1 % nasal spray Place 2 sprays into both nostrils 2 (two) times daily. 30 mL 1   Bioflavonoid Products (VITAMIN C) CHEW Chew 1 tablet by mouth daily.     cholestyramine (QUESTRAN) 4 g packet Take half a packet (2 g total) daily for 1 week. If no significant improvement after 7 days, increase to 1 packet (4 g total) a day 60 each 1   dexlansoprazole (DEXILANT) 60 MG capsule TAKE 1 CAPSULE(60 MG) BY MOUTH DAILY 90 capsule 1   eletriptan (RELPAX) 40 MG tablet TAKE 1 TABLET BY MOUTH AS NEEDED FOR MIGRAINE OR HEADACHE AND MAY REPEAT IN 2 HOURS IF HEADACHE PERSISTS OR RECURS 10 tablet 5   fluticasone (FLONASE) 50 MCG/ACT nasal spray Place 2 sprays into both nostrils daily. 48 g 3   hyoscyamine ([name]) 0.125 MG tablet Take 1 tablet (0.125 mg total)  by mouth every 6 (six) hours as needed. 60 tablet 3   MYRBETRIQ 25 MG TB24 tablet TAKE 1 TABLET(25 MG) BY MOUTH DAILY 30 tablet 2   nitroGLYCERIN (NITROSTAT) 0.4 MG SL tablet Place 0.4 mg under the tongue every 5 (five) minutes as needed for chest pain.     ondansetron (ZOFRAN) 4 MG tablet TAKE 1 TABLET(4 MG) BY MOUTH EVERY 8 HOURS AS NEEDED FOR NAUSEA OR VOMITING 20 tablet 0   topiramate (TOPAMAX) 100 MG tablet TAKE 1 TABLET(100 MG) BY MOUTH TWICE DAILY 180 tablet 0   Vitamin D, Ergocalciferol, (DRISDOL) 1.25 MG (50000 UNIT) CAPS capsule Take 1 capsule (50,000 Units total) by mouth every 7 (seven) days for 8 doses. 8 capsule 0   No current facility-administered medications for this visit.    Physical Exam:     There  were no vitals taken for this visit.  GENERAL:  Pleasant female in NAD PSYCH: : Cooperative, normal affect Musculoskeletal:  Normal muscle tone, normal strength NEURO: Alert and oriented x 3, no focal neurologic deficits   IMPRESSION and PLAN:    1) GERD 2) Hiatal hernia - Prior EGD was erosive esophagitis pain positive Bravo despite continued Dexilant, consistent with breakthrough. - Normal fasting gastrin despite PPI - Continue Dexilant 60 mg BID - Does not want to pursue antireflux surgical options  3) Diarrhea 4) Abdominal bloating 5) Change in bowel habits - Again discussed DDx to include SIBO, IBS-D, bile salt diarrhea, medication ADR, etc.  Has had clinical improvement with cholestyramine    - Will increase cholestyramine to BID dosing - Trial course of rifaximin 550 mg tid x14 days.  Able to provide with samples from the office today - Continue probiotics and continue at least 4 weeks beyond completion of rifaximin course - Continue [name] prn  6) Iron deficiency anemia - Continue iron therapy - Repeat CBC and iron panel in 3 months  RTC in 3-6 months or sooner as needed      Lavena Bullion ,DO, FACG 07/19/2021, 3:07 PM

## 2021-07-19 NOTE — Patient Instructions (Signed)
If you are age 65 or older, your body mass index should be between 23-30. Your Body mass index is 22.66 kg/m. If this is out of the aforementioned range listed, please consider follow up with your Primary Care Provider.  If you are age 38 or younger, your body mass index should be between 19-25. Your Body mass index is 22.66 kg/m. If this is out of the aformentioned range listed, please consider follow up with your Primary Care Provider.   __________________________________________________________  The [facility] providers would like to encourage you to use [facility] to communicate with providers for non-urgent requests or questions.  Due to long hold times on the telephone, sending your provider a message by [facility] may be a faster and more efficient way to get a response.  Please allow 48 business hours for a response.  Please remember that this is for non-urgent requests.   We have sent the following medications to your pharmacy for you to pick up at your convenience:   Xifaxin Samples -550mg  three times daily for 14 days Cholestyramine 4 gm twice  Thank you for choosing me and [facility].  Vito Cirigliano, D.O.

## 2021-08-01 ENCOUNTER — Telehealth: Payer: Self-pay | Admitting: Family

## 2021-08-01 DIAGNOSIS — Z9189 Other specified personal risk factors, not elsewhere classified: Secondary | ICD-10-CM

## 2021-08-01 NOTE — Telephone Encounter (Signed)
See mychart.  

## 2021-08-01 NOTE — Progress Notes (Addendum)
[facility])                                            [place] Team                                        Statin Quality Measure Assessment    08/01/2021  BOBBY RAGAN 15-Nov-1955 [id number]  Per review of chart and payor information, this patient has been flagged for non-adherence to the following CMS Quality Measure:   []  Statin Use in Persons with Diabetes  [x]  Statin Use in Persons with Cardiovascular Disease  The ASCVD Risk score (Arnett DK, et al., 2019) failed to calculate for the following reasons:   Cannot find a previous HDL lab   Cannot find a previous total cholesterol lab   Patient does not have a recent/updated lipid panel to calculate ASCVD risk score.    Patient was previously on atorvastatin, but is not currently filling medication. Unsure why atorvastatin was stopped. Next appt w/ PCP Dr. Inda Castle is 08/03/2021. If deemed therapeutically appropriate, please consider re-starting statin or if statin d/c'd due to an intolerance please associate exclusion code (see options below) at the next appointment.   Please consider ONE of the following recommendations:   Initiate high intensity statin Atorvastatin 40mg  once daily, #90, 3 refills   Rosuvastatin 20mg  once daily, #90, 3 refills    Initiate moderate intensity          statin with reduced frequency if prior          statin intolerance 1x weekly, #13, 3 refills   2x weekly, #26, 3 refills   3x weekly, #39, 3 refills    Code for past statin intolerance or other exclusions (required annually)  Drug Induced Myopathy G72.0   Myositis, unspecified M60.9   Rhabdomyolysis M62.82   Prediabetes R73.03   Adverse effect of antihyperlipidemic and antiarteriosclerotic drugs, initial encounter C94.4H6P    Thank you for your time,  Kristeen Miss, [place] Cell: [phone]

## 2021-08-02 MED ORDER — ATORVASTATIN CALCIUM 20 MG PO TABS: 20.0000 mg | ORAL_TABLET | Freq: Every day | ORAL | 1 refills | Status: DC

## 2021-08-02 NOTE — Addendum Note (Signed)
Addended by: Debbrah Alar on: 08/02/2021 10:20 AM   Modules accepted: Orders

## 2021-08-03 ENCOUNTER — Ambulatory Visit (INDEPENDENT_AMBULATORY_CARE_PROVIDER_SITE_OTHER): Payer: Medicare Other | Admitting: Family

## 2021-08-03 ENCOUNTER — Other Ambulatory Visit: Payer: Self-pay

## 2021-08-03 VITALS — BP 134/91 | HR 84 | Temp 97.9°F | Resp 16 | Ht 64.0 in | Wt 134.0 lb

## 2021-08-03 DIAGNOSIS — J0100 Acute maxillary sinusitis, unspecified: Secondary | ICD-10-CM

## 2021-08-03 MED ORDER — BENZONATATE 100 MG PO CAPS: 100.0000 mg | ORAL_CAPSULE | Freq: Three times a day (TID) | ORAL | 0 refills | Status: DC | PRN

## 2021-08-03 MED ORDER — AMOXICILLIN-POT CLAVULANATE 875-125 MG PO TABS: 1.0000 | ORAL_TABLET | Freq: Two times a day (BID) | ORAL | 0 refills | Status: DC

## 2021-08-03 NOTE — Assessment & Plan Note (Signed)
New. Rx with augmentin, tessalon prn cough.  Recommended that she continue her probiotics.

## 2021-08-03 NOTE — Progress Notes (Signed)
Subjective:     Patient ID: Lindsay Frost, female    DOB: 27-Jul-1956, 65 y.o.   MRN: [id number]  Chief Complaint  Patient presents with   Cough    Complains of productive cough   Facial Pain    Complains of sinus pain    Cough   Started sneezing, coughing 1 week ago. At night she had to sleep propped up because her sinuses were draining.  Reports that her nasal discharge has been bloody.      Health Maintenance Due  Topic Date Due   COVID-19 Vaccine (1) Never done   Zoster Vaccines- Shingrix (1 of 2) Never done   INFLUENZA VACCINE  04/16/2021   Pneumonia Vaccine 31+ Years old (1 - PCV) Never done    Past Medical History:  Diagnosis Date   Acute midline thoracic back pain 02/16/2021   Acute sinusitis 09/05/2014   Allergic rhinitis 07/16/2016   Angina pectoris ([place]) 07/25/2020   Arthritis    B12 deficiency 01/02/2021   Back pain    arthritis   Chronic sinusitis 12/05/2020   Depression    takes cymbalta daily   Diarrhea 02/16/2021   Duodenal stenosis    Epigastric pain 07/29/2018   Essential hypertension 05/14/2019   Gallstones    Generalized abdominal pain 02/16/2021   GERD (gastroesophageal reflux disease)    takes Omeprazole daily   Headache    Heart murmur    Hiatal hernia    History of gastric ulcer    History of migraine    last one about 2wks ago-takes Imitrex prn   History of seizures    Joint pain    Joint swelling    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   Meningioma ([place])    Migraine without aura and without status migrainosus, not intractable 01/13/2015   Mixed dyslipidemia 07/25/2020   Myringotomy tube status 02/27/2021   Nausea 02/16/2021   Nocturnal leg cramps 02/20/2015   Obesity (BMI 30-39.9) 07/22/2014   Osteoarthritis of left knee 12/02/2012   Overactive bladder 01/02/2021   Peptic ulcer disease    Physical exam 07/22/2014   Recurrent acute serous otitis media of left ear 12/05/2020    Seizure-like activity ([place]) 05/12/2014    Past Surgical History:  Procedure Laterality Date   BALLOON DILATION  04/03/2012   Procedure: BALLOON DILATION;  Surgeon: Beryle Beams, MD;  Location: [facility];  Service: Endoscopy;  Laterality: N/A;   BIOPSY  08/24/2019   Procedure: BIOPSY;  Surgeon: Lavena Bullion, DO;  Location: [facility];  Service: Gastroenterology;;  EGD and COLON   BRAVO [place] STUDY N/A 08/24/2019   Procedure: BRAVO [place];  Surgeon: Lavena Bullion, DO;  Location: [facility];  Service: Gastroenterology;  Laterality: N/A;   CHOLECYSTECTOMY N/A 04/24/2018   Procedure: LAPAROSCOPIC CHOLECYSTECTOMY;  Surgeon: Clovis Riley, MD;  Location: [facility];  Service: General;  Laterality: N/A;   COLONOSCOPY     COLONOSCOPY WITH PROPOFOL N/A 08/24/2019   Procedure: COLONOSCOPY WITH PROPOFOL;  Surgeon: Lavena Bullion, DO;  Location: [facility];  Service: Gastroenterology;  Laterality: N/A;   DIAGNOSTIC LAPAROSCOPY  19yrs ago   ESOPHAGEAL MANOMETRY N/A 09/04/2015   Procedure: ESOPHAGEAL MANOMETRY (EM);  Surgeon: Carol Ada, MD;  Location: [facility];  Service: Endoscopy;  Laterality: N/A;   ESOPHAGOGASTRODUODENOSCOPY  04/03/2012   Procedure: ESOPHAGOGASTRODUODENOSCOPY (EGD);  Surgeon: Beryle Beams, MD;  Location: [facility];  Service: Endoscopy;  Laterality: N/A;  EGD w/  balloon dilation   ESOPHAGOGASTRODUODENOSCOPY (EGD) WITH PROPOFOL N/A 02/18/2014   Procedure: ESOPHAGOGASTRODUODENOSCOPY (EGD) WITH PROPOFOL;  Surgeon: Beryle Beams, MD;  [facility]: [facility];  Service: Endoscopy;  Laterality: N/A;   ESOPHAGOGASTRODUODENOSCOPY (EGD) WITH PROPOFOL N/A 04/22/2014   Procedure: ESOPHAGOGASTRODUODENOSCOPY (EGD) WITH PROPOFOL;  Surgeon: Beryle Beams, MD;  Location: [facility];  Service: Endoscopy;  Laterality: N/A;   ESOPHAGOGASTRODUODENOSCOPY (EGD) WITH PROPOFOL N/A 08/24/2019   Procedure: ESOPHAGOGASTRODUODENOSCOPY (EGD) WITH PROPOFOL;  Surgeon: Lavena Bullion,  DO;  Location: [facility];  Service: Gastroenterology;  Laterality: N/A;   LEFT HEART CATH AND CORONARY ANGIOGRAPHY N/A 04/18/2021   Procedure: LEFT HEART CATH AND CORONARY ANGIOGRAPHY;  Surgeon: Lorretta Harp, MD;  Location: [facility];  Service: Cardiovascular;  Laterality: N/A;   NASAL SEPTUM SURGERY  09/2016   [place] IMPEDANCE STUDY N/A 09/04/2015   Procedure: [place] IMPEDANCE STUDY;  Surgeon: Carol Ada, MD;  Location: [facility];  Service: Endoscopy;  Laterality: N/A;   REPLACEMENT TOTAL KNEE Right 7yrs ago   TOTAL KNEE ARTHROPLASTY Left 11/30/2012   Procedure: LEFT TOTAL KNEE ARTHROPLASTY;  Surgeon: Kerin Salen, MD;  Location: [place];  Service: Orthopedics;  Laterality: Left;    Family History  Problem Relation Age of Onset   Arthritis Mother    Cancer Mother        breast   Heart disease Mother        died from "heart problems" at age 86, had CAD   Depression Mother    Heart disease Father        had CABG   Kidney disease Father    Arthritis Sister    Heart murmur Sister    Arthritis Brother        "serious back/neck problems"   Arthritis Sister    Aneurysm Sister    Healthy Daughter    Colon cancer Neg Hx    Esophageal cancer Neg Hx     Social History   Socioeconomic History   Marital status: Widowed    Spouse name: Not on file   Number of children: 1   Years of education: Not on file   Highest education level: Some college, no degree  Occupational History   Occupation: Retired  Tobacco Use   Smoking status: Never   Smokeless tobacco: Never  Vaping Use   Vaping Use: Never used  Substance and Sexual Activity   Alcohol use: No   Drug use: No   Sexual activity: Not Currently    Birth control/protection: Post-menopausal  Other Topics Concern   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, mining   Single   1 daughter- lives in [place]   2 dogs   Enjoys reading, watching television   Pt is  right handed   Drinks coffee once a week, green tea daily, soda sometimes    Social Determinants of Radio broadcast assistant Strain: Not on file  Food Insecurity: Not on file  Transportation Needs: Not on file  Physical Activity: Not on file  Stress: Not on file  Social Connections: Not on file  Intimate Partner Violence: Not on file    Outpatient Medications Prior to Visit  Medication Sig Dispense Refill   alendronate (FOSAMAX) 70 MG tablet Take 1 tablet (70 mg total) by mouth every 7 (seven) days. Take with a full glass of water on an empty stomach. 4 tablet 11   aluminum-magnesium  hydroxide-simethicone (MAALOX) [phone] MG/5ML SUSP Take 30 mLs by mouth daily as needed for indigestion (indigestion).     amLODipine (NORVASC) 5 MG tablet TAKE 1 TABLET(5 MG) BY MOUTH DAILY 90 tablet 0   aspirin EC 81 MG tablet Take 1 tablet (81 mg total) by mouth daily. Swallow whole. 90 tablet 3   Aspirin-Salicylamide-Caffeine (BC HEADACHE PO) Take 1 packet by mouth daily as needed for headache (headaches).     atorvastatin (LIPITOR) 20 MG tablet Take 1 tablet (20 mg total) by mouth daily. 90 tablet 1   azelastine (ASTELIN) 0.1 % nasal spray Place 2 sprays into both nostrils 2 (two) times daily. 30 mL 1   Bioflavonoid Products (VITAMIN C) CHEW Chew 1 tablet by mouth daily.     cholestyramine (QUESTRAN) 4 g packet Take 1 packet (4 g total) by mouth 2 (two) times daily. 60 each 3   dexlansoprazole (DEXILANT) 60 MG capsule TAKE 1 CAPSULE(60 MG) BY MOUTH DAILY 90 capsule 1   eletriptan (RELPAX) 40 MG tablet TAKE 1 TABLET BY MOUTH AS NEEDED FOR MIGRAINE OR HEADACHE AND MAY REPEAT IN 2 HOURS IF HEADACHE PERSISTS OR RECURS 10 tablet 5   fluticasone (FLONASE) 50 MCG/ACT nasal spray Place 2 sprays into both nostrils daily. 48 g 3   hyoscyamine ([name]) 0.125 MG tablet Take 1 tablet (0.125 mg total) by mouth every 6 (six) hours as needed. 60 tablet 3   MYRBETRIQ 25 MG TB24 tablet TAKE 1 TABLET(25 MG) BY MOUTH  DAILY 30 tablet 2   nitroGLYCERIN (NITROSTAT) 0.4 MG SL tablet Place 0.4 mg under the tongue every 5 (five) minutes as needed for chest pain.     ondansetron (ZOFRAN) 4 MG tablet TAKE 1 TABLET(4 MG) BY MOUTH EVERY 8 HOURS AS NEEDED FOR NAUSEA OR VOMITING 20 tablet 0   topiramate (TOPAMAX) 100 MG tablet TAKE 1 TABLET(100 MG) BY MOUTH TWICE DAILY 180 tablet 0   No facility-administered medications prior to visit.    Allergies  Allergen Reactions   Naratriptan     Ineffective    Review of Systems  Respiratory:  Positive for cough.       Objective:    Physical Exam Constitutional:      General: She is not in acute distress.    Appearance: Normal appearance. She is well-developed.  HENT:     Head: Normocephalic and atraumatic.     Right Ear: Tympanic membrane, ear canal and external ear normal.     Left Ear: External ear normal.     Ears:     Comments: L Tympanostomy tube in place.     Nose:     Right Sinus: Maxillary sinus tenderness present. No frontal sinus tenderness.     Left Sinus: Maxillary sinus tenderness present. No frontal sinus tenderness.  Eyes:     General: No scleral icterus. Neck:     Thyroid: No thyromegaly.  Cardiovascular:     Rate and Rhythm: Normal rate and regular rhythm.     Heart sounds: Normal heart sounds. No murmur heard. Pulmonary:     Effort: Pulmonary effort is normal. No respiratory distress.     Breath sounds: Normal breath sounds. No wheezing.  Musculoskeletal:     Cervical back: Neck supple.  Skin:    General: Skin is warm and dry.  Neurological:     Mental Status: She is alert and oriented to person, place, and time.  Psychiatric:        Mood and Affect: Mood normal.  Behavior: Behavior normal.        Thought Content: Thought content normal.        Judgment: Judgment normal.    BP (!) 134/91 (BP Location: Right Arm, Patient Position: Sitting, Cuff Size: Small)   Pulse 84   Temp 97.9 F (36.6 C) (Oral)   Resp 16   Ht 5'  4" (1.626 m)   Wt 134 lb (60.8 kg)   SpO2 100%   BMI 23.00 kg/m  Wt Readings from Last 3 Encounters:  08/03/21 134 lb (60.8 kg)  07/19/21 132 lb (59.9 kg)  06/05/21 139 lb 8 oz (63.3 kg)       Assessment & Plan:   Problem List Items Addressed This Visit       Unprioritized   Acute maxillary sinusitis - Primary    New. Rx with augmentin, tessalon prn cough.  Recommended that she continue her probiotics.       Relevant Medications   amoxicillin-clavulanate (AUGMENTIN) 875-125 MG tablet   benzonatate (TESSALON) 100 MG capsule    I am having Lindsay Frost. Ambrosio start on amoxicillin-clavulanate and benzonatate. I am also having her maintain her Aspirin-Salicylamide-Caffeine (BC HEADACHE PO), azelastine, alendronate, aspirin EC, fluticasone, Myrbetriq, dexlansoprazole, nitroGLYCERIN, Vitamin C, aluminum-magnesium hydroxide-simethicone, eletriptan, amLODipine, hyoscyamine, topiramate, ondansetron, cholestyramine, and atorvastatin.  Meds ordered this encounter  Medications   amoxicillin-clavulanate (AUGMENTIN) 875-125 MG tablet    Sig: Take 1 tablet by mouth 2 (two) times daily.    Dispense:  20 tablet    Refill:  0    Order Specific Question:   Supervising Provider    Answer:   Penni Homans A [[id number]]   benzonatate (TESSALON) 100 MG capsule    Sig: Take 1 capsule (100 mg total) by mouth 3 (three) times daily as needed.    Dispense:  20 capsule    Refill:  0    Order Specific Question:   Supervising Provider    Answer:   Penni Homans A [[id number]]

## 2021-08-03 NOTE — Patient Instructions (Addendum)
Please begin augmentin twice daily for sinus infection. For cough, you may use tessalon as needed.

## 2021-08-04 ENCOUNTER — Other Ambulatory Visit: Payer: Self-pay | Admitting: Gastroenterology

## 2021-08-31 ENCOUNTER — Other Ambulatory Visit: Payer: Self-pay | Admitting: Family

## 2021-09-13 ENCOUNTER — Other Ambulatory Visit: Payer: Self-pay | Admitting: Neurology

## 2021-09-14 ENCOUNTER — Other Ambulatory Visit: Payer: Self-pay | Admitting: Family

## 2021-10-01 ENCOUNTER — Other Ambulatory Visit: Payer: Self-pay | Admitting: Family

## 2021-10-01 DIAGNOSIS — K219 Gastro-esophageal reflux disease without esophagitis: Secondary | ICD-10-CM

## 2021-10-01 DIAGNOSIS — Z8711 Personal history of peptic ulcer disease: Secondary | ICD-10-CM

## 2021-10-04 DIAGNOSIS — Z9622 Myringotomy tube(s) status: Secondary | ICD-10-CM | POA: Diagnosis not present

## 2021-10-04 DIAGNOSIS — J329 Chronic sinusitis, unspecified: Secondary | ICD-10-CM | POA: Diagnosis not present

## 2021-10-31 ENCOUNTER — Other Ambulatory Visit: Payer: Self-pay | Admitting: Family

## 2021-11-02 ENCOUNTER — Telehealth: Payer: Self-pay | Admitting: Family

## 2021-11-02 NOTE — Telephone Encounter (Signed)
Left message for patient to call back and schedule Medicare Annual Wellness Visit (AWV) in office.   If not able to come in office, please offer to do virtually or by telephone.  Left office number and my jabber [phone].  Last AWV:01/07/2017  Please schedule at anytime with Nurse Health Advisor.

## 2021-11-09 ENCOUNTER — Ambulatory Visit (INDEPENDENT_AMBULATORY_CARE_PROVIDER_SITE_OTHER): Payer: Medicare Other

## 2021-11-09 VITALS — BP 124/86 | HR 75 | Temp 98.5°F | Resp 16 | Ht 64.0 in | Wt 130.8 lb

## 2021-11-09 DIAGNOSIS — Z0000 Encounter for general adult medical examination without abnormal findings: Secondary | ICD-10-CM | POA: Diagnosis not present

## 2021-11-09 DIAGNOSIS — Z23 Encounter for immunization: Secondary | ICD-10-CM

## 2021-11-09 NOTE — Progress Notes (Signed)
Subjective:   Lindsay Frost is a 66 y.o. female who presents for Medicare Annual (Subsequent) preventive examination.  Review of Systems     Cardiac Risk Factors include: advanced age (>35men, >57 women);hypertension;dyslipidemia     Objective:    Today's Vitals   11/09/21 1148  BP: 124/86  Pulse: 75  Resp: 16  Temp: 98.5 F (36.9 C)  TempSrc: Oral  SpO2: 98%  Weight: 130 lb 12.8 oz (59.3 kg)  Height: 5\' 4"  (1.626 m)   Body mass index is 22.45 kg/m.  Advanced Directives 11/09/2021 04/18/2021 11/13/2020 08/24/2019 08/16/2019 03/10/2019 04/24/2018  Does Patient Have a Medical Advance Directive? No No No No No No No  Would patient like information on creating a medical advance directive? Yes (MAU/Ambulatory/Procedural Areas - Information given) No - Patient declined - No - Patient declined No - Patient declined - No - Patient declined  Pre-existing out of facility DNR order (yellow form or pink MOST form) - - - - - - -    Current Medications (verified) Outpatient Encounter Medications as of 11/09/2021  Medication Sig   alendronate (FOSAMAX) 70 MG tablet Take 1 tablet (70 mg total) by mouth every 7 (seven) days. Take with a full glass of water on an empty stomach.   aluminum-magnesium hydroxide-simethicone (MAALOX) 176-160-73 MG/5ML SUSP Take 30 mLs by mouth daily as needed for indigestion (indigestion).   amLODipine (NORVASC) 5 MG tablet TAKE 1 TABLET(5 MG) BY MOUTH DAILY   aspirin EC 81 MG tablet Take 1 tablet (81 mg total) by mouth daily. Swallow whole.   Aspirin-Salicylamide-Caffeine (BC HEADACHE PO) Take 1 packet by mouth daily as needed for headache (headaches).   atorvastatin (LIPITOR) 20 MG tablet Take 1 tablet (20 mg total) by mouth daily.   azelastine (ASTELIN) 0.1 % nasal spray Place 2 sprays into both nostrils 2 (two) times daily.   benzonatate (TESSALON) 100 MG capsule Take 1 capsule (100 mg total) by mouth 3 (three) times daily as needed.   Bioflavonoid Products  (VITAMIN C) CHEW Chew 1 tablet by mouth daily.   cholestyramine (QUESTRAN) 4 g packet Take 1 packet (4 g total) by mouth 2 (two) times daily.   dexlansoprazole (DEXILANT) 60 MG capsule TAKE 1 CAPSULE(60 MG) BY MOUTH DAILY   eletriptan (RELPAX) 40 MG tablet TAKE 1 TABLET BY MOUTH AS NEEDED FOR MIGRAINE OR HEADACHE AND MAY REPEAT IN 2 HOURS IF HEADACHE PERSISTS OR RECURS   fluticasone (FLONASE) 50 MCG/ACT nasal spray Place 2 sprays into both nostrils daily.   MYRBETRIQ 25 MG TB24 tablet TAKE 1 TABLET(25 MG) BY MOUTH DAILY   nitroGLYCERIN (NITROSTAT) 0.4 MG SL tablet Place 0.4 mg under the tongue every 5 (five) minutes as needed for chest pain.   ondansetron (ZOFRAN) 4 MG tablet TAKE 1 TABLET(4 MG) BY MOUTH EVERY 8 HOURS AS NEEDED FOR NAUSEA OR VOMITING   topiramate (TOPAMAX) 100 MG tablet TAKE 1 TABLET(100 MG) BY MOUTH TWICE DAILY   hyoscyamine ([name]) 0.125 MG tablet Take 1 tablet (0.125 mg total) by mouth every 6 (six) hours as needed.   [DISCONTINUED] amoxicillin-clavulanate (AUGMENTIN) 875-125 MG tablet Take 1 tablet by mouth 2 (two) times daily.   No facility-administered encounter medications on file as of 11/09/2021.    Allergies (verified) Naratriptan   History: Past Medical History:  Diagnosis Date   Acute midline thoracic back pain 02/16/2021   Acute sinusitis 09/05/2014   Allergic rhinitis 07/16/2016   Angina pectoris ([place]) 07/25/2020   Arthritis  B12 deficiency 01/02/2021   Back pain    arthritis   Chronic sinusitis 12/05/2020   Depression    takes cymbalta daily   Diarrhea 02/16/2021   Duodenal stenosis    Epigastric pain 07/29/2018   Essential hypertension 05/14/2019   Gallstones    Generalized abdominal pain 02/16/2021   GERD (gastroesophageal reflux disease)    takes Omeprazole daily   Headache    Heart murmur    Hiatal hernia    History of gastric ulcer    History of migraine    last one about 2wks ago-takes Imitrex prn   History of seizures    Joint pain     Joint swelling    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   Meningioma ([place])    Migraine without aura and without status migrainosus, not intractable 01/13/2015   Mixed dyslipidemia 07/25/2020   Myringotomy tube status 02/27/2021   Nausea 02/16/2021   Nocturnal leg cramps 02/20/2015   Obesity (BMI 30-39.9) 07/22/2014   Osteoarthritis of left knee 12/02/2012   Overactive bladder 01/02/2021   Peptic ulcer disease    Physical exam 07/22/2014   Recurrent acute serous otitis media of left ear 12/05/2020   Seizure-like activity ([place]) 05/12/2014   Past Surgical History:  Procedure Laterality Date   BALLOON DILATION  04/03/2012   Procedure: BALLOON DILATION;  Surgeon: Beryle Beams, MD;  Location: [facility];  Service: Endoscopy;  Laterality: N/A;   BIOPSY  08/24/2019   Procedure: BIOPSY;  Surgeon: Lavena Bullion, DO;  Location: [facility];  Service: Gastroenterology;;  EGD and COLON   BRAVO [place] STUDY N/A 08/24/2019   Procedure: BRAVO [place];  Surgeon: Lavena Bullion, DO;  Location: [facility];  Service: Gastroenterology;  Laterality: N/A;   CHOLECYSTECTOMY N/A 04/24/2018   Procedure: LAPAROSCOPIC CHOLECYSTECTOMY;  Surgeon: [place] Riley, MD;  Location: [facility];  Service: General;  Laterality: N/A;   COLONOSCOPY     COLONOSCOPY WITH PROPOFOL N/A 08/24/2019   Procedure: COLONOSCOPY WITH PROPOFOL;  Surgeon: Lavena Bullion, DO;  [facility];  Service: Gastroenterology;  Laterality: N/A;   DIAGNOSTIC LAPAROSCOPY  79yrs ago   ESOPHAGEAL MANOMETRY N/A 09/04/2015   Procedure: ESOPHAGEAL MANOMETRY (EM);  Surgeon: Carol Ada, MD;  [facility];  Service: Endoscopy;  Laterality: N/A;   ESOPHAGOGASTRODUODENOSCOPY  04/03/2012   Procedure: ESOPHAGOGASTRODUODENOSCOPY (EGD);  Surgeon: Beryle Beams, MD;  Location: [facility];  Service: Endoscopy;  Laterality: N/A;  EGD w/ balloon dilation   ESOPHAGOGASTRODUODENOSCOPY (EGD) WITH  PROPOFOL N/A 02/18/2014   Procedure: ESOPHAGOGASTRODUODENOSCOPY (EGD) WITH PROPOFOL;  Surgeon: Beryle Beams, MD;  [facility];  Service: Endoscopy;  Laterality: N/A;   ESOPHAGOGASTRODUODENOSCOPY (EGD) WITH PROPOFOL N/A 04/22/2014   Procedure: ESOPHAGOGASTRODUODENOSCOPY (EGD) WITH PROPOFOL;  Surgeon: Beryle Beams, MD;  [facility];  Service: Endoscopy;  Laterality: N/A;   ESOPHAGOGASTRODUODENOSCOPY (EGD) WITH PROPOFOL N/A 08/24/2019   Procedure: ESOPHAGOGASTRODUODENOSCOPY (EGD) WITH PROPOFOL;  Surgeon: Lavena Bullion, DO;  [facility];  Service: Gastroenterology;  Laterality: N/A;   LEFT HEART CATH AND CORONARY ANGIOGRAPHY N/A 04/18/2021   Procedure: LEFT HEART CATH AND CORONARY ANGIOGRAPHY;  Surgeon: Lorretta Harp, MD;  Location: [facility];  Service: Cardiovascular;  Laterality: N/A;   NASAL SEPTUM SURGERY  09/2016   [place] IMPEDANCE STUDY N/A 09/04/2015   Procedure: [place] IMPEDANCE STUDY;  Surgeon: Carol Ada, MD;  [facility];  Service: Endoscopy;  Laterality: N/A;   REPLACEMENT TOTAL  KNEE Right 66yrs ago   TOTAL KNEE ARTHROPLASTY Left 11/30/2012   Procedure: LEFT TOTAL KNEE ARTHROPLASTY;  Surgeon: Kerin Salen, MD;  Location: [facility];  Service: Orthopedics;  Laterality: Left;   Family History  Problem Relation Age of Onset   Arthritis Mother    Cancer Mother        breast   Heart disease Mother        died from "heart problems" at age 53, had CAD   Depression Mother    Heart disease Father        had CABG   Kidney disease Father    Arthritis Sister    Heart murmur Sister    Arthritis Brother        "serious back/neck problems"   Arthritis Sister    Aneurysm Sister    Healthy Daughter    Colon cancer Neg Hx    Esophageal cancer Neg Hx    Social History   Socioeconomic History   Marital status: Widowed    Spouse name: Not on file   Number of children: 1   Years of education: Not on file   Highest education level: Some  college, no degree  Occupational History   Occupation: Retired  Tobacco Use   Smoking status: Never   Smokeless tobacco: Never  Vaping Use   Vaping Use: Never used  Substance and Sexual Activity   Alcohol use: No   Drug use: No   Sexual activity: Not Currently    Birth control/protection: Post-menopausal  Other Topics Concern   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, mining   Single   1 daughter- lives in [place]   2 dogs   Enjoys reading, watching television   Pt is right handed   Drinks coffee once a week, green tea daily, soda sometimes    Social Determinants of Radio broadcast assistant Strain: Low Risk    Difficulty of Paying Living Expenses: Not hard at all  Food Insecurity: No Food Insecurity   Worried About Charity fundraiser in the Last Year: Never true   Arboriculturist in the Last Year: Never true  Transportation Needs: No Transportation Needs   Lack of Transportation (Medical): No   Lack of Transportation (Non-Medical): No  Physical Activity: Inactive   Days of Exercise per Week: 0 days   Minutes of Exercise per Session: 0 min  Stress: No Stress Concern Present   Feeling of Stress : Not at all  Social Connections: Moderately Isolated   Frequency of Communication with Friends and Family: Three times a week   Frequency of Social Gatherings with Friends and Family: More than three times a week   Attends Religious Services: More than 4 times per year   Active Member of Clubs or Organizations: No   Attends Archivist Meetings: Never   Marital Status: Widowed    Tobacco Counseling Counseling given: Not Answered   Clinical Intake:  Pre-visit preparation completed: Yes  Pain : No/denies pain     BMI - recorded: 22.45 Nutritional Status: BMI of 19-24  Normal Nutritional Risks: None Diabetes: No  How often do you need to have someone help you when you read instructions,  pamphlets, or other written materials from your doctor or pharmacy?: 1 - Never  Diabetic?No  Interpreter Needed?: No  Information entered by :: Caroleen Hamman LPN   Activities of Daily Living  In your present state of health, do you have any difficulty performing the following activities: 11/09/2021 08/03/2021  Hearing? Y N  Vision? N N  Difficulty concentrating or making decisions? N N  Walking or climbing stairs? Y N  Comment going down stairs -  Dressing or bathing? N N  Doing errands, shopping? N N  Preparing Food and eating ? N -  Using the Toilet? N -  In the past six months, have you accidently leaked urine? N -  Do you have problems with loss of bowel control? N -  Managing your Medications? N -  Managing your Finances? N -  Housekeeping or managing your Housekeeping? N -  Some recent data might be hidden    Patient Care Team: Debbrah Alar, NP as PCP - General (Internal Medicine) Frederik Pear, MD as Consulting Physician (Orthopedic Surgery) Carol Ada, MD as Consulting Physician (Gastroenterology) Pieter Partridge, DO as Consulting Physician (Neurology)  Indicate any recent Medical Services you may have received from other than [facility] providers in the past year (date may be approximate).     Assessment:   This is a routine wellness examination for Lindsay Frost.  Hearing/Vision screen Hearing Screening - Comments:: C/o mild hearing loss Vision Screening - Comments:: Last eye exam-05/2021-[facility]  Dietary issues and exercise activities discussed: Current Exercise Habits: The patient does not participate in regular exercise at present, Exercise limited by: None identified   Goals Addressed             This Visit's Progress    Patient Stated       Would like to increase activity       Depression Screen PHQ 2/9 Scores 11/09/2021 01/30/2021 07/04/2020 09/30/2017 07/23/2017 01/02/2016 08/30/2015  PHQ - 2 Score 0 0 2 2 2 2 1   PHQ- 9 Score - - 11 11 15 2  -   Exception Documentation - - - - - Medical reason -    Fall Risk Fall Risk  11/09/2021 01/30/2021 11/13/2020 08/16/2019 03/10/2019  Falls in the past year? 0 0 0 0 0  Number falls in past yr: 0 0 0 0 -  Injury with Fall? 0 0 0 0 -  Follow up Falls prevention discussed - - - -    FALL RISK PREVENTION PERTAINING TO THE HOME:  Any stairs in or around the home? No  Home free of loose throw rugs in walkways, pet beds, electrical cords, etc? Yes  Adequate lighting in your home to reduce risk of falls? Yes   ASSISTIVE DEVICES UTILIZED TO PREVENT FALLS:  Life alert? No  Use of a cane, walker or w/c? No  Grab bars in the bathroom? Yes  Shower chair or bench in shower? Yes  Elevated toilet seat or a handicapped toilet? Yes   TIMED UP AND GO:  Was the test performed? Yes .  Length of time to ambulate 10 feet: 11 sec.   Gait steady and fast without use of assistive device  Cognitive Function:Normal cognitive status assessed by direct observation by this Nurse Health Advisor. No abnormalities found.   MMSE - Mini Mental State Exam 01/15/2016  Orientation to time 5  Orientation to Place 5  Registration 3  Attention/ Calculation 5  Recall 3  Language- name 2 objects 2  Language- repeat 1  Language- follow 3 step command 3  Language- read & follow direction 1  Write a sentence 1  Copy design 0  Total score 29  Immunizations Immunization History  Administered Date(s) Administered   Fluad Quad(high Dose 65+) 11/09/2021   Influenza,inj,Quad PF,6+ Mos 06/17/2016, 05/17/2017, 06/26/2018, 07/04/2020   Tdap 08/05/2013    TDAP status: Up to date  Flu Vaccine status: Completed at today's visit  Pneumococcal vaccine status: Due, Education has been provided regarding the importance of this vaccine. Advised may receive this vaccine at local pharmacy or Health Dept. Aware to provide a copy of the vaccination record if obtained from local pharmacy or Health Dept. Verbalized  acceptance and understanding.  Covid-19 vaccine status: Declined, Education has been provided regarding the importance of this vaccine but patient still declined. Advised may receive this vaccine at local pharmacy or Health Dept.or vaccine clinic. Aware to provide a copy of the vaccination record if obtained from local pharmacy or Health Dept. Verbalized acceptance and understanding.  Qualifies for Shingles Vaccine? Yes   Zostavax completed No   Shingrix Completed?: No.    Education has been provided regarding the importance of this vaccine. Patient has been advised to call insurance company to determine out of pocket expense if they have not yet received this vaccine. Advised may also receive vaccine at local pharmacy or Health Dept. Verbalized acceptance and understanding.  Screening Tests Health Maintenance  Topic Date Due   COVID-19 Vaccine (1) Never done   Zoster Vaccines- Shingrix (1 of 2) Never done   Pneumonia Vaccine 54+ Years old (1 - PCV) Never done   MAMMOGRAM  12/15/2021   COLONOSCOPY (Pts 45-88yrs Insurance coverage will need to be confirmed)  08/23/2022   TETANUS/TDAP  08/06/2023   INFLUENZA VACCINE  Completed   DEXA SCAN  Completed   Hepatitis C Screening  Completed   HIV Screening  Completed   HPV VACCINES  Aged Out   PAP SMEAR-Modifier  Discontinued    Health Maintenance  Health Maintenance Due  Topic Date Due   COVID-19 Vaccine (1) Never done   Zoster Vaccines- Shingrix (1 of 2) Never done   Pneumonia Vaccine 6+ Years old (1 - PCV) Never done    Colorectal cancer screening: Type of screening: Colonoscopy. Completed 08/24/2019. Repeat every 3 years  Mammogram status: Completed bilateral 12/15/2020. Repeat every year  Bone Density status: Completed 07/11/2020. Results reflect: Bone density results: OSTEOPOROSIS. Repeat every 2 years.  Lung Cancer Screening: (Low Dose CT Chest recommended if Age 59-80 years, 30 pack-year currently smoking OR have quit w/in  15years.) does not qualify.     Additional Screening:  Hepatitis C Screening: Completed 06/17/2016  Vision Screening: Recommended annual ophthalmology exams for early detection of glaucoma and other disorders of the eye. Is the patient up to date with their annual eye exam?  Yes  Who is the provider or what is the name of the office in which the patient attends annual eye exams? [facility]    Dental Screening: Recommended annual dental exams for proper oral hygiene  Community Resource Referral / Chronic Care Management: CRR required this visit?  No   CCM required this visit?  No      Plan:     I have personally reviewed and noted the following in the patients chart:   Medical and social history Use of alcohol, tobacco or illicit drugs  Current medications and supplements including opioid prescriptions.  Functional ability and status Nutritional status Physical activity Advanced directives List of other physicians Hospitalizations, surgeries, and ER visits in previous 12 months Vitals Screenings to include cognitive, depression, and falls Referrals and appointments  In  addition, I have reviewed and discussed with patient certain preventive protocols, quality metrics, and best practice recommendations. A written personalized care plan for preventive services as well as general preventive health recommendations were provided to patient.   Patient would like to access avs on mychart.  Marta Antu, LPN   [id number]/81/[id number]  Nurse Health Advisor  Nurse Notes: None

## 2021-11-09 NOTE — Patient Instructions (Signed)
Lindsay Frost , Thank you for taking time to come for your Medicare Wellness Visit. I appreciate your ongoing commitment to your health goals. Please review the following plan we discussed and let me know if I can assist you in the future.   Screening recommendations/referrals: Colonoscopy: Completed 08/24/2019-Due 08/23/2022 Mammogram: Completed 12/15/2020-Due 12/15/2021 Bone Density: Completed 07/11/2020-Due 07/11/2022 Recommended yearly ophthalmology/optometry visit for glaucoma screening and checkup Recommended yearly dental visit for hygiene and checkup  Vaccinations: Influenza vaccine: Up to date-Completed at today's visit Pneumococcal vaccine: Due-May obtain vaccine at our office or your local pharmacy. Tdap vaccine: Up to date-Due-08/06/2023 Shingles vaccine:-May obtain vaccine at your local pharmacy. Covid-19:Declined  Advanced directives: Information provided today  Conditions/risks identified: See problem list  Next appointment: Follow up in one year for your annual wellness visit    Preventive Care 65 Years and Older, Female Preventive care refers to lifestyle choices and visits with your health care provider that can promote health and wellness. What does preventive care include? A yearly physical exam. This is also called an annual well check. Dental exams once or twice a year. Routine eye exams. Ask your health care provider how often you should have your eyes checked. Personal lifestyle choices, including: Daily care of your teeth and gums. Regular physical activity. Eating a healthy diet. Avoiding tobacco and drug use. Limiting alcohol use. Practicing safe sex. Taking low-dose aspirin every day. Taking vitamin and mineral supplements as recommended by your health care provider. What happens during an annual well check? The services and screenings done by your health care provider during your annual well check will depend on your age, overall health, lifestyle risk  factors, and family history of disease. Counseling  Your health care provider may ask you questions about your: Alcohol use. Tobacco use. Drug use. Emotional well-being. Home and relationship well-being. Sexual activity. Eating habits. History of falls. Memory and ability to understand (cognition). Work and work Statistician. Reproductive health. Screening  You may have the following tests or measurements: Height, weight, and BMI. Blood pressure. Lipid and cholesterol levels. These may be checked every 5 years, or more frequently if you are over 89 years old. Skin check. Lung cancer screening. You may have this screening every year starting at age 65 if you have a 30-pack-year history of smoking and currently smoke or have quit within the past 15 years. Fecal occult blood test (FOBT) of the stool. You may have this test every year starting at age 72. Flexible sigmoidoscopy or colonoscopy. You may have a sigmoidoscopy every 5 years or a colonoscopy every 10 years starting at age 77. Hepatitis C blood test. Hepatitis B blood test. Sexually transmitted disease (STD) testing. Diabetes screening. This is done by checking your blood sugar (glucose) after you have not eaten for a while (fasting). You may have this done every 1-3 years. Bone density scan. This is done to screen for osteoporosis. You may have this done starting at age 42. Mammogram. This may be done every 1-2 years. Talk to your health care provider about how often you should have regular mammograms. Talk with your health care provider about your test results, treatment options, and if necessary, the need for more tests. Vaccines  Your health care provider may recommend certain vaccines, such as: Influenza vaccine. This is recommended every year. Tetanus, diphtheria, and acellular pertussis (Tdap, Td) vaccine. You may need a Td booster every 10 years. Zoster vaccine. You may need this after age 20. Pneumococcal 13-valent  conjugate (PCV13) vaccine.  One dose is recommended after age 7. Pneumococcal polysaccharide (PPSV23) vaccine. One dose is recommended after age 44. Talk to your health care provider about which screenings and vaccines you need and how often you need them. This information is not intended to replace advice given to you by your health care provider. Make sure you discuss any questions you have with your health care provider. Document Released: 09/29/2015 Document Revised: 05/22/2016 Document Reviewed: 07/04/2015 Elsevier Interactive Patient Education  2017 [employer] Prevention in the Home Falls can cause injuries. They can happen to people of all ages. There are many things you can do to make your home safe and to help prevent falls. What can I do on the outside of my home? Regularly fix the edges of walkways and driveways and fix any cracks. Remove anything that might make you trip as you walk through a door, such as a raised step or threshold. Trim any bushes or trees on the path to your home. Use bright outdoor lighting. Clear any walking paths of anything that might make someone trip, such as rocks or tools. Regularly check to see if handrails are loose or broken. Make sure that both sides of any steps have handrails. Any raised decks and porches should have guardrails on the edges. Have any leaves, snow, or ice cleared regularly. Use sand or salt on walking paths during winter. Clean up any spills in your garage right away. This includes oil or grease spills. What can I do in the bathroom? Use night lights. Install grab bars by the toilet and in the tub and shower. Do not use towel bars as grab bars. Use non-skid mats or decals in the tub or shower. If you need to sit down in the shower, use a plastic, non-slip stool. Keep the floor dry. Clean up any water that spills on the floor as soon as it happens. Remove soap buildup in the tub or shower regularly. Attach bath mats  securely with double-sided non-slip rug tape. Do not have throw rugs and other things on the floor that can make you trip. What can I do in the bedroom? Use night lights. Make sure that you have a light by your bed that is easy to reach. Do not use any sheets or blankets that are too big for your bed. They should not hang down onto the floor. Have a firm chair that has side arms. You can use this for support while you get dressed. Do not have throw rugs and other things on the floor that can make you trip. What can I do in the kitchen? Clean up any spills right away. Avoid walking on wet floors. Keep items that you use a lot in easy-to-reach places. If you need to reach something above you, use a strong step stool that has a grab bar. Keep electrical cords out of the way. Do not use floor polish or wax that makes floors slippery. If you must use wax, use non-skid floor wax. Do not have throw rugs and other things on the floor that can make you trip. What can I do with my stairs? Do not leave any items on the stairs. Make sure that there are handrails on both sides of the stairs and use them. Fix handrails that are broken or loose. Make sure that handrails are as long as the stairways. Check any carpeting to make sure that it is firmly attached to the stairs. Fix any carpet that is loose or worn.  Avoid having throw rugs at the top or bottom of the stairs. If you do have throw rugs, attach them to the floor with carpet tape. Make sure that you have a light switch at the top of the stairs and the bottom of the stairs. If you do not have them, ask someone to add them for you. What else can I do to help prevent falls? Wear shoes that: Do not have high heels. Have rubber bottoms. Are comfortable and fit you well. Are closed at the toe. Do not wear sandals. If you use a stepladder: Make sure that it is fully opened. Do not climb a closed stepladder. Make sure that both sides of the stepladder  are locked into place. Ask someone to hold it for you, if possible. Clearly mark and make sure that you can see: Any grab bars or handrails. First and last steps. Where the edge of each step is. Use tools that help you move around (mobility aids) if they are needed. These include: Canes. Walkers. Scooters. Crutches. Turn on the lights when you go into a dark area. Replace any light bulbs as soon as they burn out. Set up your furniture so you have a clear path. Avoid moving your furniture around. If any of your floors are uneven, fix them. If there are any pets around you, be aware of where they are. Review your medicines with your doctor. Some medicines can make you feel dizzy. This can increase your chance of falling. Ask your doctor what other things that you can do to help prevent falls. This information is not intended to replace advice given to you by your health care provider. Make sure you discuss any questions you have with your health care provider. Document Released: 06/29/2009 Document Revised: 02/08/2016 Document Reviewed: 10/07/2014 Elsevier Interactive Patient Education  2017 [employer].

## 2021-11-12 NOTE — Progress Notes (Signed)
NEUROLOGY FOLLOW UP OFFICE NOTE  Lindsay Frost [id number]  Assessment/Plan:   1.  Migraine without aura, without status migrainosus, not intractable 2.  Seizure disorder, stable    1. Migraine/Seizure prevention:  topiramate 100mg  twice daily 2.  Migraine rescue:  Relpax 40mg  3.  Limit use of pain relievers to no more than 2 days out of week to prevent risk of rebound or medication-overuse headache. 4.  Keep headache diary 5.  Follow up 1 year   Subjective:  Lindsay Frost is a 66 year old female with SIBO, HTN and dyslipidemia who follows up for migraines and seizure disorder   UPDATE: Migraine: She has been following with ENT and found to have recurrent serous otitis media in the left ear.  She had a tube inserted which has helped.    She was experiencing chest pain and followed up with cardiology.  Coronary angiography was negative for CAD.    She averages one severe migraine over past few months.  She has been waking up with a headache about 3 to 4 times a month, usually when raining outside.  She will try Advil first, and will then take Relpax as second line, so may last 3 hours.     Current NSAIDS:  Ibuprofen Current analgesics:  Tylenol Current triptans:  Relpax Current ergotamine:  none Current anti-emetic:  none Current muscle relaxants:  Flexeril (for back pain) Current anti-anxiolytic:  none Current sleep aide:  none Current Antihypertensive medications:  none Current Antidepressant medications:  Lexapro 20mg  Current Anticonvulsant medications:  topiramate 100mg  twice daily Current anti-CGRP:  none Current Vitamins/Herbal/Supplements:  none Current Antihistamines/Decongestants:  Flonase Other therapy:  none   Caffeine:  no caffeine Alcohol:  no Smoker:  no Diet:  Some junk food.  Drinks flavored water.   Exercise:  Walks dog Depression:  no; Anxiety:  yes Other pain:  Back pain.  Has an atypical spinal hemangioma as well. Sleep hygiene:  good    Seizures: No seizures.  Last spell occurred 05/27/15.   Current preventative: topiramate 100mg  twice daily   CBC and CMP from November largely unremarkable.   HISTORY: Migraine: Onset:  Early 40s Location:  Left frontal Quality:  Stabbing Initial Intensity:  10 out of 10; September 7/10 Aura:  No Prodrome:  Photophobia Associated symptoms:  Nausea, phonophobia, photophobia, sometimes vomiting. No osmophobia, visual disturbance, or autonomic symptoms.  No associated unilateral numbness or weakness. Initial Duration:  2-3 days; September 3 hours with Relpax Initial Frequency:  Migraines occur every 2 weeks (4-6 days per month) and dull headaches occur 2 times a week (8 days per month). Total of 14 headache days per month; September 3 times a month Triggers:  Heat Relieving factors:  Laying down in a quiet, dark, cool room with ice pack. Activity:  Cannot function when she has migraine   Past abortive therapy:  sumatriptan 50mg  (ineffective, caused racing heart), Maxalt (ineffective), BC powder Past preventative therapy:  Cymbalta (for depression), nortriptyline 50mg  (discontinued in order to simplify medication list) Unable to start Inderal LA because insurance wouldnt cover it.   Family history of headache:  daughter   Seizure: Her Wellbutrin was increased in August 2016 to help control her depression.    At around that time, she had an episode where she woke up and found herself on the bathroom floor.  She didnt remember falling.  Later in the month, she had another episode where she woke up on the floor next to her bed and  noted that she was briefly kicking both legs.  She had a severe rug burn on the left side of her face.  She did not bite her tongue during either event but she did have some urinary incontinence during the episode in the bathroom.  Her PCP subsequently decreased her dose of Wellbutrin.  She has no prior history of seizures.  She had a sleep deprived EEG performed on  06/07/14, which showed brief intermittent transient sharp waves and slowing in the left temporal region, which although not epileptiform, could be a seizure focus.   On 05/27/15, she had a different spell.  It was unwitnessed.  When she woke up, she was short of breath and it took a few minutes to recover.  Sometimes, she will wake up short of breath, but this episode was worse.  She did not have incontinence or tongue biting.     MRI of brain with and without contrast from 08/19/13 demonstrated a 12 mm left parietal calcified meningioma without surrounding vasogenic edema, minimally increased compared to prior study from 10/2003. MRI of the brain with and without contrast was performed on 06/08/14, which demonstrated stability of the meningioma.  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   Past antiepileptic medication:  Keppra  PAST MEDICAL HISTORY: Past Medical History:  Diagnosis Date   Acute midline thoracic back pain 02/16/2021   Acute sinusitis 09/05/2014   Allergic rhinitis 07/16/2016   Angina pectoris ([place]) 07/25/2020   Arthritis    B12 deficiency 01/02/2021   Back pain    arthritis   Chronic sinusitis 12/05/2020   Depression    takes cymbalta daily   Diarrhea 02/16/2021   Duodenal stenosis    Epigastric pain 07/29/2018   Essential hypertension 05/14/2019   Gallstones    Generalized abdominal pain 02/16/2021   GERD (gastroesophageal reflux disease)    takes Omeprazole daily   Headache    Heart murmur    Hiatal hernia    History of gastric ulcer    History of migraine    last one about 2wks ago-takes Imitrex prn   History of seizures    Joint pain    Joint swelling    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   Meningioma ([place])    Migraine without aura and without status migrainosus, not intractable 01/13/2015    Mixed dyslipidemia 07/25/2020   Myringotomy tube status 02/27/2021   Nausea 02/16/2021   Nocturnal leg cramps 02/20/2015   Obesity (BMI 30-39.9) 07/22/2014   Osteoarthritis of left knee 12/02/2012   Overactive bladder 01/02/2021   Peptic ulcer disease    Physical exam 07/22/2014   Recurrent acute serous otitis media of left ear 12/05/2020   Seizure-like activity ([place]) 05/12/2014    MEDICATIONS: Current Outpatient Medications on File Prior to Visit  Medication Sig Dispense Refill   alendronate (FOSAMAX) 70 MG tablet Take 1 tablet (70 mg total) by mouth every 7 (seven) days. Take with a full glass of water on an empty stomach. 4 tablet 11   aluminum-magnesium hydroxide-simethicone (MAALOX) 854-627-03 MG/5ML SUSP Take 30 mLs by mouth daily as needed for indigestion (indigestion).     amLODipine (NORVASC) 5 MG tablet TAKE 1 TABLET(5 MG) BY MOUTH DAILY 90 tablet 0   aspirin EC 81 MG tablet Take 1 tablet (81 mg total) by mouth daily. Swallow whole. 90 tablet 3  Aspirin-Salicylamide-Caffeine (BC HEADACHE PO) Take 1 packet by mouth daily as needed for headache (headaches).     atorvastatin (LIPITOR) 20 MG tablet Take 1 tablet (20 mg total) by mouth daily. 90 tablet 1   azelastine (ASTELIN) 0.1 % nasal spray Place 2 sprays into both nostrils 2 (two) times daily. 30 mL 1   benzonatate (TESSALON) 100 MG capsule Take 1 capsule (100 mg total) by mouth 3 (three) times daily as needed. 20 capsule 0   Bioflavonoid Products (VITAMIN C) CHEW Chew 1 tablet by mouth daily.     cholestyramine (QUESTRAN) 4 g packet Take 1 packet (4 g total) by mouth 2 (two) times daily. 60 each 3   dexlansoprazole (DEXILANT) 60 MG capsule TAKE 1 CAPSULE(60 MG) BY MOUTH DAILY 90 capsule 1   eletriptan (RELPAX) 40 MG tablet TAKE 1 TABLET BY MOUTH AS NEEDED FOR MIGRAINE OR HEADACHE AND MAY REPEAT IN 2 HOURS IF HEADACHE PERSISTS OR RECURS 10 tablet 5   fluticasone (FLONASE) 50 MCG/ACT nasal spray Place 2 sprays into both nostrils  daily. 48 g 3   hyoscyamine ([name]) 0.125 MG tablet Take 1 tablet (0.125 mg total) by mouth every 6 (six) hours as needed. 60 tablet 3   MYRBETRIQ 25 MG TB24 tablet TAKE 1 TABLET(25 MG) BY MOUTH DAILY 30 tablet 2   nitroGLYCERIN (NITROSTAT) 0.4 MG SL tablet Place 0.4 mg under the tongue every 5 (five) minutes as needed for chest pain.     ondansetron (ZOFRAN) 4 MG tablet TAKE 1 TABLET(4 MG) BY MOUTH EVERY 8 HOURS AS NEEDED FOR NAUSEA OR VOMITING 20 tablet 0   topiramate (TOPAMAX) 100 MG tablet TAKE 1 TABLET(100 MG) BY MOUTH TWICE DAILY 180 tablet 0   No current facility-administered medications on file prior to visit.    ALLERGIES: Allergies  Allergen Reactions   Naratriptan     Ineffective    FAMILY HISTORY: Family History  Problem Relation Age of Onset   Arthritis Mother    Cancer Mother        breast   Heart disease Mother        died from "heart problems" at age 14, had CAD   Depression Mother    Heart disease Father        had CABG   Kidney disease Father    Arthritis Sister    Heart murmur Sister    Arthritis Brother        "serious back/neck problems"   Arthritis Sister    Aneurysm Sister    Healthy Daughter    Colon cancer Neg Hx    Esophageal cancer Neg Hx       Objective:  Blood pressure 128/86, pulse 86, resp. rate 18, height 5\' 4"  (1.626 m), weight 130 lb (59 kg). General: No acute distress.  Patient appears well-groomed.   Head:  Normocephalic/atraumatic Eyes:  Fundi examined but not visualized Neurological Exam: alert and oriented to person, place, and time.  Speech fluent and not dysarthric, language intact.  CN II-XII intact. Bulk and tone normal, muscle strength 5/5 throughout.  Sensation to light touch intact.  Deep tendon reflexes 2+ throughout.  Finger to nose testing intact.  Gait normal, Romberg negative.   Lindsay Clines, DO  CC: Lindsay Alar, NP

## 2021-11-13 ENCOUNTER — Other Ambulatory Visit: Payer: Self-pay

## 2021-11-13 ENCOUNTER — Ambulatory Visit (INDEPENDENT_AMBULATORY_CARE_PROVIDER_SITE_OTHER): Payer: Medicare Other | Admitting: Neurology

## 2021-11-13 ENCOUNTER — Encounter: Payer: Self-pay | Admitting: Neurology

## 2021-11-13 VITALS — BP 128/86 | HR 86 | Resp 18 | Ht 64.0 in | Wt 130.0 lb

## 2021-11-13 DIAGNOSIS — G40209 Localization-related (focal) (partial) symptomatic epilepsy and epileptic syndromes with complex partial seizures, not intractable, without status epilepticus: Secondary | ICD-10-CM | POA: Diagnosis not present

## 2021-11-13 DIAGNOSIS — G43009 Migraine without aura, not intractable, without status migrainosus: Secondary | ICD-10-CM

## 2021-11-14 ENCOUNTER — Other Ambulatory Visit: Payer: Self-pay | Admitting: Neurology

## 2021-12-20 ENCOUNTER — Other Ambulatory Visit: Payer: Self-pay | Admitting: Neurology

## 2022-01-01 ENCOUNTER — Other Ambulatory Visit: Payer: Self-pay | Admitting: Family

## 2022-01-03 ENCOUNTER — Telehealth (INDEPENDENT_AMBULATORY_CARE_PROVIDER_SITE_OTHER): Payer: Medicare Other | Admitting: Family Medicine

## 2022-01-03 ENCOUNTER — Encounter: Payer: Self-pay | Admitting: Family Medicine

## 2022-01-03 DIAGNOSIS — J0140 Acute pansinusitis, unspecified: Secondary | ICD-10-CM | POA: Diagnosis not present

## 2022-01-03 MED ORDER — BENZONATATE 200 MG PO CAPS: 200.0000 mg | ORAL_CAPSULE | Freq: Two times a day (BID) | ORAL | 0 refills | Status: DC | PRN

## 2022-01-03 MED ORDER — AMOXICILLIN-POT CLAVULANATE 875-125 MG PO TABS: 1.0000 | ORAL_TABLET | Freq: Two times a day (BID) | ORAL | 0 refills | Status: AC

## 2022-01-03 MED ORDER — FLUTICASONE PROPIONATE 50 MCG/ACT NA SUSP: 2.0000 | Freq: Every day | NASAL | 3 refills | Status: DC

## 2022-01-03 MED ORDER — ONDANSETRON HCL 4 MG PO TABS: ORAL_TABLET | ORAL | 0 refills | Status: DC

## 2022-01-03 NOTE — Progress Notes (Signed)
Sick for about 2 weeks ?Sinus pressure/congestion ?Productive cough ?Denies fever ?Taking mucinex, advil ?

## 2022-01-03 NOTE — Progress Notes (Signed)
Virtual Video Visit via MyChart Note - converted to telephone  ? ?I connected with  Lindsay Frost on 01/03/22 at 10:20 AM EDT by the video enabled telemedicine application for MyChart, and verified that I am speaking with the correct person using two identifiers. ?  ?I introduced myself as a Designer, jewellery with the practice. We discussed the limitations of evaluation and management by telemedicine and the availability of in person appointments. The patient expressed understanding and agreed to proceed. ? ?Participating parties in this visit include: The patient and the nurse practitioner listed.  ?The patient is: At home ?I am: In the office - [facility] ? ?Subjective:   ? ?CC: sinusitis  ? ? ?HPI: Lindsay Frost is a 66 y.o. year old female presenting today via [facility] today for sinusitis. ? ?Patient reports she has been having 2 weeks of sinus pain, congestion, headaches, rhinorrhea, productive cough, trouble sleeping due to drainage/cough, nausea, fatigue, malaise.  She has been taking Claritin and Mucinex.  She denies any chest pain, dyspnea, wheezing, GI/GU symptoms, fevers.  She has not been on any antibiotics recently.  No known sick contacts. ? ? ? ?Past medical history, Surgical history, Family history not pertinant except as noted below, Social history, Allergies, and medications have been entered into the medical record, reviewed, and corrections made.  ? ?Review of Systems:  ?All review of systems negative except what is listed in the HPI ? ? ?Objective:   ? ?General:  ?Speaking clearly in complete sentences. ?Absent shortness of breath noted.   ?Alert and oriented x3.   ?Normal judgment.  ?Absent acute distress. ? ? ?Impression and Recommendations:   ? ?1. Acute non-recurrent pansinusitis ?- amoxicillin-clavulanate (AUGMENTIN) 875-125 MG tablet; Take 1 tablet by mouth 2 (two) times daily for 10 days.  Dispense: 20 tablet; Refill: 0 ?- ondansetron (ZOFRAN) 4 MG  tablet; TAKE 1 TABLET(4 MG) BY MOUTH EVERY 8 HOURS AS NEEDED FOR NAUSEA OR VOMITING  Dispense: 20 tablet; Refill: 0 ?- benzonatate (TESSALON) 200 MG capsule; Take 1 capsule (200 mg total) by mouth 2 (two) times daily as needed for cough.  Dispense: 20 capsule; Refill: 0 ?- fluticasone (FLONASE) 50 MCG/ACT nasal spray; Place 2 sprays into both nostrils daily.  Dispense: 48 g; Refill: 3 ? ?Given duration, treating with antibiotics.  She is also requesting some Zofran for the nausea.  Adding Tessalon for cough and Flonase for her sinus congestion. Continue supportive measures including rest, hydration, humidifier use, steam showers, warm compresses to sinuses, warm liquids with lemon and honey, and over-the-counter cough, cold, and analgesics as needed.  ?Patient aware of signs/symptoms requiring further/urgent evaluation.  ? ? ?Follow-up if symptoms worsen or fail to improve.  ?  ?I discussed the assessment and treatment plan with the patient. The patient was provided an opportunity to ask questions and all were answered. The patient agreed with the plan and demonstrated an understanding of the instructions. ?  ?The patient was advised to call back or seek an in-person evaluation if the symptoms worsen or if the condition fails to improve as anticipated. ? ?I spent 20 minutes dedicated to the care of this patient on the date of this encounter to include pre-visit chart review of prior notes and results, face-to-face time with the patient, and post-visit ordering of testing as indicated.  ? ?Terrilyn Saver, NP  ? ?

## 2022-01-11 DIAGNOSIS — Z1231 Encounter for screening mammogram for malignant neoplasm of breast: Secondary | ICD-10-CM | POA: Diagnosis not present

## 2022-01-11 LAB — HM MAMMOGRAPHY

## 2022-01-23 ENCOUNTER — Other Ambulatory Visit: Payer: Self-pay | Admitting: Family

## 2022-02-12 ENCOUNTER — Other Ambulatory Visit: Payer: Self-pay | Admitting: Family Medicine

## 2022-02-12 DIAGNOSIS — J0140 Acute pansinusitis, unspecified: Secondary | ICD-10-CM

## 2022-02-13 ENCOUNTER — Encounter: Payer: Self-pay | Admitting: Family

## 2022-02-13 DIAGNOSIS — J0140 Acute pansinusitis, unspecified: Secondary | ICD-10-CM

## 2022-02-13 MED ORDER — ONDANSETRON HCL 4 MG PO TABS: ORAL_TABLET | ORAL | 0 refills | Status: DC

## 2022-02-19 ENCOUNTER — Encounter: Payer: Self-pay | Admitting: Family

## 2022-02-19 ENCOUNTER — Other Ambulatory Visit: Payer: Self-pay | Admitting: Family

## 2022-02-19 DIAGNOSIS — Z8711 Personal history of peptic ulcer disease: Secondary | ICD-10-CM

## 2022-02-19 DIAGNOSIS — K219 Gastro-esophageal reflux disease without esophagitis: Secondary | ICD-10-CM

## 2022-02-25 ENCOUNTER — Telehealth: Payer: Self-pay | Admitting: Gastroenterology

## 2022-02-25 NOTE — Telephone Encounter (Signed)
She has had to take the Dexilant bid in the past as well.  Please send in Rx for Dexilant 60 mg bid, 90-day supply, RF 5.  Separately, given good response to [name] in the past, plan for another course of rifaximin 550 mg tid x14 days, RF 1.  To take probiotics as well x4 weeks beyond completion of rifaximin course.

## 2022-02-25 NOTE — Telephone Encounter (Signed)
Pt states that she needs a refill sent in for Dexilant. Let pt know that it looks like Dexilant was sent in to her pharmacy on 02/19/22 by her PCP. Pt stated that her PCP sent in the dexilant 60 daily and she has been taking it BID for the last 6 weeks. Pt stated that she is unable to pick up med from pharmacy because her insurance won't cover medication because it's too early. Pt stated she has to take Dexilant BID because if she just takes It in the morning she will have reflux and vomit a little at night.  Pt also reported that she feels like her SIBO is back. Pt states that diarrhea started 2 weeks ago and she has a bowel movement every time she eats. Pt unsure how many times per day. Pt also reports she has abd pain and nausea and denies fever. Pt states she is taking cholestyramine BID PRN and still taking [name] PRN. Pt requesting that xifaxan be sent in or an alternative to xifaxan if insurance will not cover. Pt scheduled for appt on 7/18 at 11:20 am with Dr. Bryan Lemma.

## 2022-02-25 NOTE — Telephone Encounter (Signed)
Inbound call from patient wanting to speak with nurse about possibly getting another prescription of  Dexlansoprazole. Patient is requesting a call back to discuss. Please advise.

## 2022-02-26 ENCOUNTER — Other Ambulatory Visit: Payer: Self-pay

## 2022-02-26 MED ORDER — RIFAXIMIN 550 MG PO TABS: 550.0000 mg | ORAL_TABLET | Freq: Three times a day (TID) | ORAL | 0 refills | Status: DC

## 2022-02-26 MED ORDER — DEXLANSOPRAZOLE 60 MG PO CPDR: 60.0000 mg | DELAYED_RELEASE_CAPSULE | Freq: Two times a day (BID) | ORAL | 5 refills | Status: DC

## 2022-02-26 NOTE — Telephone Encounter (Signed)
Prescriptions sent to pharmacy and pt notified via mychart.

## 2022-03-01 ENCOUNTER — Other Ambulatory Visit (HOSPITAL_COMMUNITY): Payer: Self-pay

## 2022-03-01 ENCOUNTER — Telehealth: Payer: Self-pay | Admitting: Pharmacy Technician

## 2022-03-01 NOTE — Telephone Encounter (Signed)
Received notification from [facility] regarding a prior authorization for [name] '550MG'$ . Authorization has been APPROVED from 6.16.23 to 6.30.23.   Per test claim, copay for 14 days supply is $0   Authorization # [id number]

## 2022-03-01 NOTE — Telephone Encounter (Signed)
Thanks so much. 

## 2022-03-01 NOTE — Telephone Encounter (Signed)
Patient Advocate Encounter  Received notification from [place] that prior authorization for XIFAXIN '550MG'$  is required.   PA submitted on 6.16.23 Key [id number] Status is pending    Luciano Cutter, CPhT Patient Advocate Phone: [phone]

## 2022-03-01 NOTE — Telephone Encounter (Signed)
Patient Advocate Encounter  Received notification from [place] that prior authorization for [place] '60MG'$  is required.   PA submitted on 6.16.23 Key [id number] Status is pending    Luciano Cutter, CPhT Patient Advocate Phone: [phone]

## 2022-03-07 ENCOUNTER — Other Ambulatory Visit (HOSPITAL_COMMUNITY): Payer: Self-pay

## 2022-03-12 ENCOUNTER — Other Ambulatory Visit: Payer: Self-pay | Admitting: Family

## 2022-03-12 DIAGNOSIS — J0140 Acute pansinusitis, unspecified: Secondary | ICD-10-CM

## 2022-03-13 ENCOUNTER — Other Ambulatory Visit: Payer: Self-pay

## 2022-03-13 DIAGNOSIS — R1010 Upper abdominal pain, unspecified: Secondary | ICD-10-CM

## 2022-03-13 DIAGNOSIS — K6389 Other specified diseases of intestine: Secondary | ICD-10-CM

## 2022-03-13 DIAGNOSIS — R140 Abdominal distension (gaseous): Secondary | ICD-10-CM

## 2022-03-13 MED ORDER — HYOSCYAMINE SULFATE 0.125 MG PO TABS: 0.1250 mg | ORAL_TABLET | Freq: Four times a day (QID) | ORAL | 3 refills | Status: DC | PRN

## 2022-04-02 ENCOUNTER — Ambulatory Visit (INDEPENDENT_AMBULATORY_CARE_PROVIDER_SITE_OTHER): Payer: Medicare Other | Admitting: Gastroenterology

## 2022-04-02 ENCOUNTER — Encounter: Payer: Self-pay | Admitting: Gastroenterology

## 2022-04-02 VITALS — BP 122/88 | HR 88 | Ht 64.0 in | Wt 128.0 lb

## 2022-04-02 DIAGNOSIS — K219 Gastro-esophageal reflux disease without esophagitis: Secondary | ICD-10-CM | POA: Diagnosis not present

## 2022-04-02 DIAGNOSIS — R194 Change in bowel habit: Secondary | ICD-10-CM

## 2022-04-02 DIAGNOSIS — K449 Diaphragmatic hernia without obstruction or gangrene: Secondary | ICD-10-CM | POA: Diagnosis not present

## 2022-04-02 DIAGNOSIS — R1084 Generalized abdominal pain: Secondary | ICD-10-CM

## 2022-04-02 DIAGNOSIS — D509 Iron deficiency anemia, unspecified: Secondary | ICD-10-CM | POA: Diagnosis not present

## 2022-04-02 DIAGNOSIS — K580 Irritable bowel syndrome with diarrhea: Secondary | ICD-10-CM | POA: Diagnosis not present

## 2022-04-02 MED ORDER — RIFAXIMIN 550 MG PO TABS: 550.0000 mg | ORAL_TABLET | Freq: Three times a day (TID) | ORAL | 0 refills | Status: DC

## 2022-04-02 MED ORDER — DICYCLOMINE HCL 10 MG PO CAPS: 10.0000 mg | ORAL_CAPSULE | Freq: Four times a day (QID) | ORAL | 3 refills | Status: DC | PRN

## 2022-04-02 MED ORDER — ESOMEPRAZOLE MAGNESIUM 40 MG PO CPDR
40.0000 mg | DELAYED_RELEASE_CAPSULE | Freq: Two times a day (BID) | ORAL | 2 refills | Status: DC
Start: 2022-04-02 — End: 2022-12-24

## 2022-04-02 NOTE — Progress Notes (Signed)
Chief Complaint:    GERD, chronic diarrhea, upper abdominal pain   GI History: Lindsay Frost is a 66 y.o. female follows in the Gastroenterology Clinic for multiple GI symptoms, to include reflux, lower abdominal pain, and change in bowel habits.   She describes a longstanding history of reflux, characterized by HB, regurgitation, sourbrash. Intermittent dry cough. +dysphagia for the last few years, pointing to lower sternal border.  Symptoms started worsening in 2020; increased postprandial and nocturnal HB. Worse with fruit, spicy food.  Has taken Dexilant 60 mg/day for years.  Takes Maalox for breakthrough sxs.  Has previously trialed Carafate, Tums, Prilosec.    Previously followed by Dr. 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   Additionally, she endorses changes in bowel habits, alternating between diarrhea and constipation since mid 2020. No preceding change in medications, diet, or activity. Intermittent BRB and marroon colored blood mixed in stool.  Intermittent lower abdominal cramping.  Not reliably improved with BM. Trialed stool softeners without change.    - 04/2018: Cholecystectomy -08/2018: CT abd/pelvis without intraabodminal process. - 2019: MRI brain: Benign hemangioma.  Patient reports some abnormality on prior spine imaging, and has been following with a spine surgeon. - 08/2020: Normal/negative GI PCR panel, C. difficile, CBC - 02/2021: GI PCR panel normal/negative.  Normal CBC, CMP, lipase - 05/2021: Normal gastrin level - 06/05/2021: GI follow-up appt.  Increased Dexilant to 60 mg bid with significant improvement in reflux.  Started Questran with improvement, but still nocturnal breakthrough abdominal pain improved with [name] - 07/19/2021: GI follow-up.  Increased cholestyramine to bid.  Trialed course of rifaximin 550 mg tid x14 days with good clinical response.       Endoscopic history: - UGI series  (11/30/2019): Small HH, nonspecific esophageal motility disorder.  Slightly irregular duodenal bulb/proximal duodenum suggesting scarring from prior ulcer disease -EGD with [facility] (08/2019, Dr. Bryan Frost; on PPI): 3 cm HH, Hill grade 3 valve, LA Grade A esophagitis, mild non-H. pylori gastritis with single ulcer in antrum (biopsy: Gastritis with intestinal metaplasia), duodenal bulb diverticulum, stenosis in duodenal bulb dilated with endoscope alone; declines repeat EGD with GIM mapping -[facility] results (08/2019 on [name]): Significant esophageal acid exposure, DeMeester score 24.5, pH <4 in 9.6% of time.  Significant reflux despite Dexilant -Colonoscopy (08/2019, Dr. Bryan Frost): Sigmoid diverticulosis, moderate stool, biopsies negative for [name].  Repeat in 3 years due to suboptimal prep -EGD (07/2018, Dr. Benson Frost): Normal esophagus, 4 gastric ulcers, 1 duodenal ulcer, antral/duodenal bulb deformity with pyloric stenosis but easily traversable -EGD (12/2015, Dr. Benson Frost): LA Grade D esophagitis, 3 cm HH, 2 gastric ulcers, gastritis, mild duodenal stenosis.  Started on Dexilant - EGD (04/2014, Dr. Benson Frost): 3 cm HH, healing antral ulcer, duodenal bulb deformity with nonobstructive stricture -EGD (02/2014, Dr. Benson Frost): 3 cm HH, multiple clean-based ulcers in the antrum, posterior duodenal bulb stenosis but traversable -EGD (03/2012, Dr. Benson Frost): Antral ulcers, post bulbar stricture, dilated with 15 mm TTS balloon with mucosal rent -EGD (03/2012, Dr. Benson Frost): Antral ulcers, post bulbar stricture which was not traversed, unsuccessful balloon dilation -Colonoscopy (07/2010, Dr. Benson Frost): Normal.  Repeat 10 years  HPI:     Patient is a 66 y.o. female presenting to the Gastroenterology Clinic for follow-up.  Last seen by me in the office on 07/19/2021.  Main issue at that time was loose stools.  Symptoms much improved with trial of rifaximin.  Started having loose stools again in June.  Completed another  trial of [name] last  month with described partial clinical response (perhaps 30% improvement; previously with robust response).  Started taking probiotics and still taking. Today, still with loose stools, which can be a/w nausea, abdominal pain. Can occur 1-2 days per week, then resolve and recur the following week.  Abdominal pain located in upper abdomen and can radiate to left side and back.  Improves with heating pad.  Tends to only occur with episodic changes in stools.  Will use [name] prn, but thinks less efficacious lately.   Only taking Questran prn now. Was having constipation with daily dosing.    Dexilant bid dosing was denied by her insurance, and now received letter that [name] get covered at all.  Currently taking Nexium 20 mg BID with improvement.    No recent abdominal imaging or new labs for review today.  Review of systems:     No chest pain, no SOB, no fevers, no urinary sx   Past Medical History:  Diagnosis Date   Acute midline thoracic back pain 02/16/2021   Acute sinusitis 09/05/2014   Allergic rhinitis 07/16/2016   Angina pectoris ([place]) 07/25/2020   Arthritis    B12 deficiency 01/02/2021   Back pain    arthritis   Chronic sinusitis 12/05/2020   Depression    takes cymbalta daily   Diarrhea 02/16/2021   Duodenal stenosis    Epigastric pain 07/29/2018   Essential hypertension 05/14/2019   Gallstones    Generalized abdominal pain 02/16/2021   GERD (gastroesophageal reflux disease)    takes Omeprazole daily   Headache    Heart murmur    Hiatal hernia    History of gastric ulcer    History of migraine    last one about 2wks ago-takes Imitrex prn   History of seizures    Joint pain    Joint swelling    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   Meningioma ([name])    Migraine without aura and without status migrainosus, not intractable 01/13/2015   Mixed dyslipidemia 07/25/2020   Myringotomy tube status 02/27/2021   Nausea 02/16/2021    Nocturnal leg cramps 02/20/2015   Obesity (BMI 30-39.9) 07/22/2014   Osteoarthritis of left knee 12/02/2012   Overactive bladder 01/02/2021   Peptic ulcer disease    Physical exam 07/22/2014   Recurrent acute serous otitis media of left ear 12/05/2020   Seizure-like activity ([place]) 05/12/2014    Patient's surgical history, family medical history, social history, medications and allergies were all reviewed in Epic    Current Outpatient Medications  Medication Sig Dispense Refill   alendronate (FOSAMAX) 70 MG tablet Take 1 tablet (70 mg total) by mouth every 7 (seven) days. Take with a full glass of water on an empty stomach. 4 tablet 11   aluminum-magnesium hydroxide-simethicone (MAALOX) 885-027-74 MG/5ML SUSP Take 30 mLs by mouth daily as needed for indigestion (indigestion).     amLODipine (NORVASC) 5 MG tablet TAKE 1 TABLET(5 MG) BY MOUTH DAILY 90 tablet 0   aspirin EC 81 MG tablet Take 1 tablet (81 mg total) by mouth daily. Swallow whole. 90 tablet 3   Aspirin-Salicylamide-Caffeine (BC HEADACHE PO) Take 1 packet by mouth daily as needed for headache (headaches).     atorvastatin (LIPITOR) 20 MG tablet TAKE 1 TABLET(20 MG) BY MOUTH DAILY 90 tablet 1   azelastine (ASTELIN) 0.1 % nasal spray Place 2 sprays into both nostrils 2 (two) times daily. 30 mL 1   Bioflavonoid Products (  VITAMIN C) CHEW Chew 1 tablet by mouth daily.     cholestyramine (QUESTRAN) 4 g packet Take 1 packet (4 g total) by mouth 2 (two) times daily. 60 each 3   eletriptan (RELPAX) 40 MG tablet TAKE 1 TABLET BY MOUTH AS NEEDED FOR MIGRAINE OR HEADACHE, MAY REPEAT IN 2 HOURS IF SYMPTOMS PERSIST OR RECURS 10 tablet 5   fluticasone (FLONASE) 50 MCG/ACT nasal spray Place 2 sprays into both nostrils daily. 48 g 3   hyoscyamine ([name]) 0.125 MG tablet Take 1 tablet (0.125 mg total) by mouth every 6 (six) hours as needed. 60 tablet 3   nitroGLYCERIN (NITROSTAT) 0.4 MG SL tablet Place 0.4 mg under the tongue every 5 (five) minutes  as needed for chest pain.     ondansetron (ZOFRAN) 4 MG tablet TAKE 1 TABLET(4 MG) BY MOUTH EVERY 8 HOURS AS NEEDED FOR NAUSEA OR VOMITING 20 tablet 0   topiramate (TOPAMAX) 100 MG tablet TAKE 1 TABLET(100 MG) BY MOUTH TWICE DAILY 180 tablet 0   MYRBETRIQ 25 MG TB24 tablet TAKE 1 TABLET(25 MG) BY MOUTH DAILY (Patient not taking: Reported on 11/13/2021) 30 tablet 2   No current facility-administered medications for this visit.    Physical Exam:     BP 122/88   Pulse 88   Ht '5\' 4"'$  (1.626 m)   Wt 128 lb (58.1 kg)   BMI 21.97 kg/m   GENERAL:  Pleasant female in NAD PSYCH: : Cooperative, normal affect Musculoskeletal:  Normal muscle tone, normal strength NEURO: Alert and oriented x 3, no focal neurologic deficits   IMPRESSION and PLAN:    1) IBS-D 2) Generalized abdominal pain 3) Change in bowel habits - Trial another course of Rifaximin 550 mg TID x14 days, RF0 - Resume probiotic - Start taking Questran tri-weekly - Change [name] to Bentyl 10 mg prn  - Low FODMAP diet.  Provided with handout and detailed instructions today - Depending on response to the above, discussed potentially trialing low-dose TCA  4) GERD 5) Hiatal hernia - Symptoms improved since starting OTC Nexium - Provided with Rx for Nexium 40 mg today and can titrate to effect - Continue antireflux lifestyle/dietary modifications  6) Iron deficiency anemia 7) Vitamin D deficiency - Repeat CBC, iron panel, ferritin, vitamin D at follow-up   RTC in 3 months or sooner as needed           Lavena Bullion ,DO, FACG 04/02/2022, 11:26 AM

## 2022-04-02 NOTE — Telephone Encounter (Signed)
Received a fax regarding Prior Authorization for [place] '60MG'$ . Authorization has been DENIED because this request was submitted for bid dosing. This plan covers a max of 1 capsule per day.  Clista Bernhardt, CPhT Pharmacy Patient Advocate Specialist [place] Patient Advocate Team Phone: [phone]   Fax: [phone]

## 2022-04-02 NOTE — Patient Instructions (Addendum)
If you are age 66 or older, your body mass index should be between 23-30. Your Body mass index is 21.97 kg/m. If this is out of the aforementioned range listed, please consider follow up with your Primary Care Provider.  If you are age 66 or older, your body mass index should be between 23-30. Your Body mass index is 21.97 kg/m. If this is out of the aformentioned range listed, please consider follow up with your Primary Care Provider.   ________________________________________________________  The [place] [facility] providers would like to encourage you to use [facility] to communicate with providers for non-urgent requests or questions.  Due to long hold times on the telephone, sending your provider a message by [facility] may be a faster and more efficient way to get a response.  Please allow 48 business hours for a response.  Please remember that this is for non-urgent requests.  _______________________________________________________  Due to recent changes in healthcare laws, you may see the results of your imaging and laboratory studies on MyChart before your provider has had a chance to review them.  We understand that in some cases there may be results that are confusing or concerning to you. Not all laboratory results come back in the same time frame and the provider may be waiting for multiple results in order to interpret others.  Please give [place] 48 hours in order for your provider to thoroughly review all the results before contacting the office for clarification of your results.    We have sent the following medications to your pharmacy for you to pick up at your convenience: Bentyl, Rifaximin, Nexium Stop taking [name]. Continue taking Questran three times a week. Please follow up in 3 months. Give [place] a call at [phone] to schedule an appointment.    Low-FODMAP Eating Plan  FODMAP stands for fermentable oligosaccharides, disaccharides, monosaccharides, and polyols. These are sugars that are  hard for some people to digest. A low-FODMAP eating plan may help some people who have irritable bowel syndrome (IBS) and certain other bowel (intestinal) diseases to manage their symptoms. This meal plan can be complicated to follow. Work with a diet and nutrition specialist (dietitian) to make a low-FODMAP eating plan that is right for you. A dietitian can help make sure that you get enough nutrition from this diet. What are tips for following this plan? Reading food labels Check labels for hidden FODMAPs such as: High-fructose syrup. Honey. Agave. Natural fruit flavors. Onion or garlic powder. Choose low-FODMAP foods that contain 3-4 grams of fiber per serving. Check food labels for serving sizes. Eat only one serving at a time to make sure FODMAP levels stay low. Shopping Shop with a list of foods that are recommended on this diet and make a meal plan. Meal planning Follow a low-FODMAP eating plan for up to 6 weeks, or as told by your health care provider or dietitian. To follow the eating plan: Eliminate high-FODMAP foods from your diet completely. Choose only low-FODMAP foods to eat. You will do this for 2-6 weeks. Gradually reintroduce high-FODMAP foods into your diet one at a time. Most people should wait a few days before introducing the next new high-FODMAP food into their meal plan. Your dietitian can recommend how quickly you may reintroduce foods. Keep a daily record of what and how much you eat and drink. Make note of any symptoms that you have after eating. Review your daily record with a dietitian regularly to identify which foods you can eat and which foods  you should avoid. General tips Drink enough fluid each day to keep your urine pale yellow. Avoid processed foods. These often have added sugar and may be high in FODMAPs. Avoid most dairy products, whole grains, and sweeteners. Work with a dietitian to make sure you get enough fiber in your diet. Avoid high FODMAP foods  at meals to manage symptoms. Recommended foods Fruits Bananas, oranges, tangerines, lemons, limes, blueberries, raspberries, strawberries, grapes, cantaloupe, honeydew melon, kiwi, papaya, passion fruit, and pineapple. Limited amounts of dried cranberries, banana chips, and shredded coconut. Vegetables Eggplant, zucchini, cucumber, peppers, green beans, bean sprouts, lettuce, arugula, kale, Swiss chard, spinach, collard greens, bok choy, summer squash, potato, and tomato. Limited amounts of corn, carrot, and sweet potato. Green parts of scallions. Grains Gluten-free grains, such as rice, oats, buckwheat, quinoa, corn, polenta, and millet. Gluten-free pasta, bread, or cereal. Rice noodles. Corn tortillas. Meats and other proteins Unseasoned beef, pork, poultry, or fish. Eggs. Berniece Salines. Tofu (firm) and tempeh. Limited amounts of nuts and seeds, such as almonds, walnuts, [place] nuts, pecans, peanuts, nut butters, pumpkin seeds, chia seeds, and sunflower seeds. Dairy Lactose-free milk, yogurt, and kefir. Lactose-free cottage cheese and ice cream. Non-dairy milks, such as almond, coconut, hemp, and rice milk. Non-dairy yogurt. Limited amounts of goat cheese, brie, mozzarella, parmesan, swiss, and other hard cheeses. Fats and oils Butter-free spreads. Vegetable oils, such as olive, canola, and sunflower oil. Seasoning and other foods Artificial sweeteners with names that do not end in "ol," such as aspartame, saccharine, and stevia. Maple syrup, white table sugar, raw sugar, brown sugar, and molasses. Mayonnaise, soy sauce, and tamari. Fresh basil, coriander, parsley, rosemary, and thyme. Beverages Water and mineral water. Sugar-sweetened soft drinks. Small amounts of orange juice or cranberry juice. Black and green tea. Most dry wines. Coffee. The items listed above may not be a complete list of foods and beverages you can eat. Contact a dietitian for more information. Foods to avoid Fruits Fresh,  dried, and juiced forms of apple, pear, watermelon, peach, plum, cherries, apricots, blackberries, boysenberries, figs, nectarines, and mango. Avocado. Vegetables Chicory root, artichoke, asparagus, cabbage, snow peas, Brussels sprouts, broccoli, sugar snap peas, mushrooms, celery, and cauliflower. Onions, garlic, leeks, and the white part of scallions. Grains Wheat, including kamut, durum, and semolina. Barley and bulgur. Couscous. Wheat-based cereals. Wheat noodles, bread, crackers, and pastries. Meats and other proteins Fried or fatty meat. Sausage. Cashews and pistachios. Soybeans, baked beans, black beans, chickpeas, kidney beans, fava beans, navy beans, lentils, black-eyed peas, and split peas. Dairy Milk, yogurt, ice cream, and soft cheese. Cream and sour cream. Milk-based sauces. Custard. Buttermilk. Soy milk. Seasoning and other foods Any sugar-free gum or candy. Foods that contain artificial sweeteners such as sorbitol, mannitol, isomalt, or xylitol. Foods that contain honey, high-fructose corn syrup, or agave. Bouillon, vegetable stock, beef stock, and chicken stock. Garlic and onion powder. Condiments made with onion, such as hummus, chutney, pickles, relish, salad dressing, and salsa. Tomato paste. Beverages Chicory-based drinks. Coffee substitutes. Chamomile tea. Fennel tea. Sweet or fortified wines such as port or sherry. Diet soft drinks made with isomalt, mannitol, maltitol, sorbitol, or xylitol. Apple, pear, and mango juice. Juices with high-fructose corn syrup. The items listed above may not be a complete list of foods and beverages you should avoid. Contact a dietitian for more information. Summary FODMAP stands for fermentable oligosaccharides, disaccharides, monosaccharides, and polyols. These are sugars that are hard for some people to digest. A low-FODMAP eating plan is a short-term diet that  helps to ease symptoms of certain bowel diseases. The eating plan usually lasts up  to 6 weeks. After that, high-FODMAP foods are reintroduced gradually and one at a time. This can help you find out which foods may be causing symptoms. A low-FODMAP eating plan can be complicated. It is best to work with a dietitian who has experience with this type of plan. This information is not intended to replace advice given to you by your health care provider. Make sure you discuss any questions you have with your health care provider. Document Revised: 01/20/2020 Document Reviewed: 01/20/2020 Elsevier Patient Education  [place] you for choosing me and [facility].  Vito Cirigliano, D.O.

## 2022-04-09 ENCOUNTER — Other Ambulatory Visit: Payer: Self-pay | Admitting: Family

## 2022-04-11 ENCOUNTER — Other Ambulatory Visit: Payer: Self-pay | Admitting: Family

## 2022-04-11 ENCOUNTER — Telehealth: Payer: Self-pay

## 2022-04-11 ENCOUNTER — Other Ambulatory Visit (HOSPITAL_COMMUNITY): Payer: Self-pay

## 2022-04-11 DIAGNOSIS — J0140 Acute pansinusitis, unspecified: Secondary | ICD-10-CM

## 2022-04-11 NOTE — Telephone Encounter (Signed)
Patient Advocate Encounter   Received notification from [facility] that prior authorization is required for [name] '550MG'$ .  Submitted: 04/11/2022 Key [id number] Status is pending  Clista Bernhardt, CPhT Rx Patient Advocate Specialist Phone: [phone]

## 2022-04-11 NOTE — Telephone Encounter (Signed)
Patient Advocate Encounter  Prior Authorization for [place] has been approved.   Effective: 04/11/2022 to 04/25/2022  Clista Bernhardt, CPhT Rx Patient Advocate Specialist Phone: [phone]

## 2022-04-24 ENCOUNTER — Telehealth (INDEPENDENT_AMBULATORY_CARE_PROVIDER_SITE_OTHER): Payer: Medicare Other | Admitting: Family Medicine

## 2022-04-24 ENCOUNTER — Encounter: Payer: Self-pay | Admitting: Family Medicine

## 2022-04-24 VITALS — BP 105/70 | Temp 99.0°F | Ht 64.0 in | Wt 120.0 lb

## 2022-04-24 DIAGNOSIS — R112 Nausea with vomiting, unspecified: Secondary | ICD-10-CM

## 2022-04-24 MED ORDER — PROMETHAZINE HCL 25 MG PO TABS: 25.0000 mg | ORAL_TABLET | Freq: Three times a day (TID) | ORAL | 0 refills | Status: DC | PRN

## 2022-04-24 NOTE — Progress Notes (Signed)
Virtual Video Visit via MyChart Note  I connected with  [name] on 04/24/22 at 10:40 AM EDT by the video enabled telemedicine application for MyChart, and verified that I am speaking with the correct person using two identifiers.   I introduced myself as a Designer, jewellery with the practice. We discussed the limitations of evaluation and management by telemedicine and the availability of in person appointments. The patient expressed understanding and agreed to proceed.  Participating parties in this visit include: The patient and the nurse practitioner listed.  The patient is: At home I am: In the office - [facility]  Subjective:    CC: nausea, vomiting   HPI: Lindsay Frost is a 66 y.o. year old female presenting today via [place] today for nausea, vomiting.  Patient states that on Saturday (5 days ago) she woke up with nausea. Spent the day with vomiting, diarrhea, nasal congestion/drainage, temp 75F, poor appetite. States she hasn't been able to keep much food down and has had poor appetite. She is staying hydrated; no GU symptoms. She has not had any blood/sputum in diarrhea or vomit. Abdominal discomfort is mostly nauseated feeling. She has not tested for COVID and has no known sick contacts.     Past medical history, Surgical history, Family history not pertinant except as noted below, Social history, Allergies, and medications have been entered into the medical record, reviewed, and corrections made.   Review of Systems:  All review of systems negative except what is listed in the HPI   Objective:    General:  Speaking clearly in complete sentences. Absent shortness of breath noted.   Alert and oriented x3.   Normal judgment.  Absent acute distress.   Impression and Recommendations:    1. Nausea and vomiting, unspecified vomiting type Given symptoms, suggest viral etiology. Out of window for Flu/COVID Rx, but suggested she  still take a COVID test at home and let us know results. Not much relief with Zofran, will try phenergan. Discussed occasional Pepto or Imodium for diarrhea. Suggested adequate hydration and BRAT diet as tolerated. No red flags. Recommend in-person evaluation if not improving in the next few days so we can add labs/stool cultures as indicated. Patient aware of signs/symptoms requiring further/urgent evaluation.    - promethazine (PHENERGAN) 25 MG tablet; Take 1 tablet (25 mg total) by mouth every 8 (eight) hours as needed for nausea or vomiting.  Dispense: 20 tablet; Refill: 0     Follow-up if symptoms worsen or fail to improve.    I discussed the assessment and treatment plan with the patient. The patient was provided an opportunity to ask questions and all were answered. The patient agreed with the plan and demonstrated an understanding of the instructions.   The patient was advised to call back or seek an in-person evaluation if the symptoms worsen or if the condition fails to improve as anticipated.    Terrilyn Saver, NP

## 2022-04-24 NOTE — Progress Notes (Signed)
Symptoms started sat N/v, diarrhea, abd pain, headache, cough, nasal congestion, low grade temp around 99 Tried liquid mucinex, advil for headaches

## 2022-04-25 ENCOUNTER — Encounter: Payer: Self-pay | Admitting: Family Medicine

## 2022-05-02 ENCOUNTER — Encounter: Payer: Self-pay | Admitting: Family Medicine

## 2022-05-02 ENCOUNTER — Ambulatory Visit (INDEPENDENT_AMBULATORY_CARE_PROVIDER_SITE_OTHER): Payer: Medicare Other | Admitting: Family Medicine

## 2022-05-02 VITALS — BP 104/78 | HR 88 | Temp 97.1°F | Resp 18 | Ht 64.0 in | Wt 121.2 lb

## 2022-05-02 DIAGNOSIS — K529 Noninfective gastroenteritis and colitis, unspecified: Secondary | ICD-10-CM | POA: Insufficient documentation

## 2022-05-02 DIAGNOSIS — R112 Nausea with vomiting, unspecified: Secondary | ICD-10-CM | POA: Diagnosis not present

## 2022-05-02 MED ORDER — PROMETHAZINE HCL 25 MG PO TABS: 25.0000 mg | ORAL_TABLET | Freq: Three times a day (TID) | ORAL | 0 refills | Status: DC | PRN

## 2022-05-02 MED ORDER — PROMETHAZINE HCL 25 MG/ML IJ SOLN
50.0000 mg | Freq: Once | INTRAMUSCULAR | Status: AC
  Administered 2022-05-02: 50 mg via INTRAMUSCULAR

## 2022-05-02 NOTE — Assessment & Plan Note (Signed)
Diarrhea has stopped Phenergan 50 mg IM given and refilled po  If vomiting cont she must go to the ER Pt and friend understand and agree gatorade  pedialyte , clear liquids today and start Brat diet tomorrow

## 2022-05-02 NOTE — Patient Instructions (Signed)
Viral Gastroenteritis, Adult  Viral gastroenteritis is also known as the stomach flu. This condition may affect your stomach, your small intestine, and your large intestine. It can cause sudden watery poop (diarrhea), fever, and vomiting. This condition is caused by certain germs (viruses). These germs can be passed from person to person very easily (are contagious). Having watery poop and vomiting can make you feel weak and cause you to not have enough water in your body (get dehydrated). This can make you tired and thirsty, make you have a dry mouth, and make it so you pee (urinate) less often. It is important to replace the fluids that you lose from having watery poop and vomiting. What are the causes? You can get sick by catching germs from other people. You can also get sick by: Eating food, drinking water, or touching a surface that has the germs on it (is contaminated). Sharing utensils or other personal items with a person who is sick. What increases the risk? Having a weak body defense system (immune system). Living with one or more children who are younger than 2 years. Living in a nursing home. Going on cruise ships. What are the signs or symptoms? Symptoms of this condition start suddenly. Symptoms may last for a few days or for as long as a week. Common symptoms include: Watery poop. Vomiting. Other symptoms include: Fever. Headache. Feeling tired (fatigue). Pain in the belly (abdomen). Chills. Feeling weak. Feeling like you may vomit (nauseous). Muscle aches. Not feeling hungry. How is this treated? This condition typically goes away on its own. The focus of treatment is to replace the fluids that you lose. This condition may be treated with: An ORS (oral rehydration solution). This is a drink that helps you replace fluids and minerals your body lost. It is sold at pharmacies and stores. Medicines to help with your symptoms. Probiotic supplements to reduce symptoms of  watery poop. Fluids given through an IV tube, if needed. Older adults and people with other diseases or a weak body defense system are at higher risk for not having enough water in the body. Follow these instructions at home: Eating and drinking  Take an ORS as told by your doctor. Drink clear fluids in small amounts as you are able. Clear fluids include: Water. Ice chips. Fruit juice that has water added to it (is diluted). Low-calorie sports drinks. Drink enough fluid to keep your pee (urine) pale yellow. Eat small amounts of healthy foods every 3-4 hours as you are able. This may include whole grains, fruits, vegetables, lean meats, and yogurt. Avoid fluids that have a lot of sugar or caffeine in them. This includes energy drinks, sports drinks, and soda. Avoid spicy or fatty foods. Avoid alcohol. General instructions  Wash your hands often. This is very important after you have watery poop or you vomit. If you cannot use soap and water, use hand sanitizer. Make sure that all people in your home wash their hands well and often. Take over-the-counter and prescription medicines only as told by your doctor. Rest at home while you get better. Watch your condition for any changes. Take a warm bath to help with any burning or pain from having watery poop. Keep all follow-up visits. Contact a doctor if: You cannot keep fluids down. Your symptoms get worse. You have new symptoms. You feel light-headed or dizzy. You have muscle cramps. Get help right away if: You have chest pain. You have trouble breathing, or you are breathing very fast.   You have a fast heartbeat. You feel very weak or you faint. You have a very bad headache, a stiff neck, or both. You have a rash. You have very bad pain, cramping, or bloating in your belly. Your skin feels cold and clammy. You feel mixed up (confused). You have pain when you pee. You have signs of not having enough water in the body, such  as: Dark pee, hardly any pee, or no pee. Cracked lips. Dry mouth. Sunken eyes. Feeling very sleepy. Feeling weak. You have signs of bleeding, such as: You see blood in your vomit. Your vomit looks like coffee grounds. You have bloody or black poop or poop that looks like tar. These symptoms may be an emergency. Get help right away. Call 911. Do not wait to see if the symptoms will go away. Do not drive yourself to the hospital. Summary Viral gastroenteritis is also known as the stomach flu. This condition can cause sudden watery poop (diarrhea), fever, and vomiting. These germs can be passed from person to person very easily. Take an ORS (oral rehydration solution) as told by your doctor. This is a drink that is sold at pharmacies and stores. Wash your hands often, especially after having watery poop or vomiting. If you cannot use soap and water, use hand sanitizer. This information is not intended to replace advice given to you by your health care provider. Make sure you discuss any questions you have with your health care provider. Document Revised: 07/02/2021 Document Reviewed: 07/02/2021 Elsevier Patient Education  2023 Elsevier Inc.  

## 2022-05-02 NOTE — Progress Notes (Signed)
Subjective:   By signing my name below, I, Shehryar Baig, attest that this documentation has been prepared under the direction and in the presence of Ann Held, DO  05/02/2022    Patient ID: Lindsay Frost, female    DOB: [birth year], 66 y.o.   MRN: [id number]  Chief Complaint  Patient presents with   Emesis    Pt was seen virutally by Lovena Le on the 04/24/2022 and states sxs got better for a couple of days but came back. Pt states diarrhea stopped a few days ago.   Diarrhea   Follow-up    HPI Patient is in today for a office visit.   She continues complaining of nausea and vomiting. Her diarrhea has stopped a couple of days ago. She is not eating since Sunday due to vomiting everything she eats. She continues taking 25 mg phenergan regularly and reports having mild relief. She is trying to maintain her liquid intake. She typically drinks small sips of water. She last vomited Tuesday. She was throwing up until 5am Wednesday. She has dizziness due to vomiting frequently. She also has headaches but cannot take her migraine medication due to throwing it up. She is eating Pedialyte to help increase her electrolyte intake. She infrequent urination. She also has cramping like pain that feels different from her IBS pain. She has seen her gastroenterologist since her last visit.    Past Medical History:  Diagnosis Date   Acute midline thoracic back pain 02/16/2021   Acute sinusitis 09/05/2014   Allergic rhinitis 07/16/2016   Angina pectoris ([place]) 07/25/2020   Arthritis    B12 deficiency 01/02/2021   Back pain    arthritis   Chronic sinusitis 12/05/2020   Depression    takes cymbalta daily   Diarrhea 02/16/2021   Duodenal stenosis    Epigastric pain 07/29/2018   Essential hypertension 05/14/2019   Gallstones    Generalized abdominal pain 02/16/2021   GERD (gastroesophageal reflux disease)    takes Omeprazole daily   Headache    Heart murmur    Hiatal hernia    History of  gastric ulcer    History of migraine    last one about 2wks ago-takes Imitrex prn   History of seizures    Joint pain    Joint swelling    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   Meningioma ([place])    Migraine without aura and without status migrainosus, not intractable 01/13/2015   Mixed dyslipidemia 07/25/2020   Myringotomy tube status 02/27/2021   Nausea 02/16/2021   Nocturnal leg cramps 02/20/2015   Obesity (BMI 30-39.9) 07/22/2014   Osteoarthritis of left knee 12/02/2012   Overactive bladder 01/02/2021   Peptic ulcer disease    Physical exam 07/22/2014   Recurrent acute serous otitis media of left ear 12/05/2020   Seizure-like activity ([place]) 05/12/2014    Past Surgical History:  Procedure Laterality Date   BALLOON DILATION  04/03/2012   Procedure: BALLOON DILATION;  Surgeon: Beryle Beams, MD;  [facility]: [facility];  Service: Endoscopy;  Laterality: N/A;   BIOPSY  08/24/2019   Procedure: BIOPSY;  Surgeon: Lavena Bullion, DO;  Location: [facility];  Service: Gastroenterology;;  EGD and COLON   BRAVO [place] STUDY N/A 08/24/2019   Procedure: BRAVO [place];  Surgeon: Lavena Bullion, DO;  [facility];  Service: Gastroenterology;  Laterality: N/A;   CHOLECYSTECTOMY N/A 04/24/2018   Procedure: LAPAROSCOPIC CHOLECYSTECTOMY;  Surgeon: Clovis Riley,  MD;  [facility]: [facility];  Service: General;  Laterality: N/A;   COLONOSCOPY     COLONOSCOPY WITH PROPOFOL N/A 08/24/2019   Procedure: COLONOSCOPY WITH PROPOFOL;  Surgeon: Lavena Bullion, DO;  [facility];  Service: Gastroenterology;  Laterality: N/A;   DIAGNOSTIC LAPAROSCOPY  70yr ago   ESOPHAGEAL MANOMETRY N/A 09/04/2015   Procedure: ESOPHAGEAL MANOMETRY (EM);  Surgeon: PCarol Ada MD;  [facility];  Service: Endoscopy;  Laterality: N/A;   ESOPHAGOGASTRODUODENOSCOPY  04/03/2012   Procedure: ESOPHAGOGASTRODUODENOSCOPY (EGD);  Surgeon: PBeryle Beams MD;   [facility];  Service: Endoscopy;  Laterality: N/A;  EGD w/ balloon dilation   ESOPHAGOGASTRODUODENOSCOPY (EGD) WITH PROPOFOL N/A 02/18/2014   Procedure: ESOPHAGOGASTRODUODENOSCOPY (EGD) WITH PROPOFOL;  Surgeon: PBeryle Beams MD;  [facility];  Service: Endoscopy;  Laterality: N/A;   ESOPHAGOGASTRODUODENOSCOPY (EGD) WITH PROPOFOL N/A 04/22/2014   Procedure: ESOPHAGOGASTRODUODENOSCOPY (EGD) WITH PROPOFOL;  Surgeon: PBeryle Beams MD;  [facility];  Service: Endoscopy;  Laterality: N/A;   ESOPHAGOGASTRODUODENOSCOPY (EGD) WITH PROPOFOL N/A 08/24/2019   Procedure: ESOPHAGOGASTRODUODENOSCOPY (EGD) WITH PROPOFOL;  Surgeon: CLavena Bullion DO;  [facility];  Service: Gastroenterology;  Laterality: N/A;   LEFT HEART CATH AND CORONARY ANGIOGRAPHY N/A 04/18/2021   Procedure: LEFT HEART CATH AND CORONARY ANGIOGRAPHY;  Surgeon: BLorretta Harp MD;  [facility];  Service: Cardiovascular;  Laterality: N/A;   NASAL SEPTUM SURGERY  09/2016   [place] N/A 09/04/2015   Procedure: [place] STUDY;  Surgeon: PCarol Ada MD;  [facility];  Service: Endoscopy;  Laterality: N/A;   REPLACEMENT TOTAL KNEE Right [id number]   TOTAL KNEE ARTHROPLASTY Left 11/30/2012   Procedure: LEFT TOTAL KNEE ARTHROPLASTY;  Surgeon: FrKerin SalenMD;  [facility];  Laterality: Left;    Family History  Problem Relation Age of Onset   Arthritis Mother    Cancer Mother        breast   Heart disease Mother        died from "heart problems" at age 3819had CAD   Depression Mother    Heart disease Father        had CABG   Kidney disease Father    Arthritis Sister    Heart murmur Sister    Arthritis Sister    Aneurysm Sister    Arthritis Brother        "serious back/neck problems"   Healthy Daughter    Colon cancer Neg Hx    Esophageal cancer Neg Hx    Pancreatic cancer Neg Hx    Stomach cancer Neg Hx     Social  History   Socioeconomic History   Marital status: Widowed    Spouse name: Not on file   Number of children: 1   Years of education: Not on file   Highest education level: Some college, no degree  Occupational History   Occupation: Retired  Tobacco Use   Smoking status: Never   Smokeless tobacco: Never  Vaping Use   Vaping Use: Never used  Substance and Sexual Activity   Alcohol use: No   Drug use: No   Sexual activity: Not Currently    Birth control/protection: Post-menopausal  Other Topics Concern   Not on file  Social History Narrative   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, mining Single   1 daughter- lives in [place] 2 dogs  Enjoys reading, watching television   Pt is right handed   Drinks coffee once a week, green tea daily, soda sometimes    Social Determinants of Health   Financial Resource Strain: Low Risk  (11/09/2021)   Overall Financial Resource Strain (CARDIA)    Difficulty of Paying Living Expenses: Not hard at all  Food Insecurity: No Food Insecurity (11/09/2021)   Hunger Vital Sign    Worried About Running Out of Food in the Last Year: Never true    Ran Out of Food in the Last Year: Never true  Transportation Needs: No Transportation Needs (11/09/2021)   PRAPARE - Hydrologist (Medical): No    Lack of Transportation (Non-Medical): No  Physical Activity: Inactive (11/09/2021)   Exercise Vital Sign    Days of Exercise per Week: 0 days    Minutes of Exercise per Session: 0 min  Stress: No Stress Concern Present (11/09/2021)   [place]    Feeling of Stress : Not at all  Social Connections: Moderately Isolated (11/09/2021)   Social Connection and Isolation Panel [NHANES]    Frequency of Communication with Friends and Family: Three times a week    Frequency of Social Gatherings with Friends and Family: More than three times a week     Attends Religious Services: More than 4 times per year    Active Member of Clubs or Organizations: No    Attends Archivist Meetings: Never    Marital Status: Widowed  Intimate Partner Violence: Not At Risk (11/09/2021)   Humiliation, Afraid, Rape, and Kick questionnaire    Fear of Current or Ex-Partner: No    Emotionally Abused: No    Physically Abused: No    Sexually Abused: No    Outpatient Medications Prior to Visit  Medication Sig Dispense Refill   alendronate (FOSAMAX) 70 MG tablet Take 1 tablet (70 mg total) by mouth every 7 (seven) days. Take with a full glass of water on an empty stomach. 4 tablet 11   aluminum-magnesium hydroxide-simethicone (MAALOX) 323-557-32 MG/5ML SUSP Take 30 mLs by mouth daily as needed for indigestion (indigestion).     amLODipine (NORVASC) 5 MG tablet TAKE 1 TABLET(5 MG) BY MOUTH DAILY 90 tablet 0   aspirin EC 81 MG tablet Take 1 tablet (81 mg total) by mouth daily. Swallow whole. 90 tablet 3   Aspirin-Salicylamide-Caffeine (BC HEADACHE PO) Take 1 packet by mouth daily as needed for headache (headaches).     atorvastatin (LIPITOR) 20 MG tablet TAKE 1 TABLET(20 MG) BY MOUTH DAILY 90 tablet 1   azelastine (ASTELIN) 0.1 % nasal spray Place 2 sprays into both nostrils 2 (two) times daily. 30 mL 1   Bioflavonoid Products (VITAMIN C) CHEW Chew 1 tablet by mouth daily.     cholestyramine (QUESTRAN) 4 g packet Take 1 packet (4 g total) by mouth 2 (two) times daily. 60 each 3   dicyclomine (BENTYL) 10 MG capsule Take 1 capsule (10 mg total) by mouth every 6 (six) hours as needed for spasms. 30 capsule 3   eletriptan (RELPAX) 40 MG tablet TAKE 1 TABLET BY MOUTH AS NEEDED FOR MIGRAINE OR HEADACHE, MAY REPEAT IN 2 HOURS IF SYMPTOMS PERSIST OR RECURS 10 tablet 5   esomeprazole (NEXIUM) 40 MG capsule Take 1 capsule (40 mg total) by mouth 2 (two) times daily. 180 capsule 2   fluticasone (FLONASE) 50 MCG/ACT nasal spray Place 2 sprays into both  nostrils daily. 48  g 3   nitroGLYCERIN (NITROSTAT) 0.4 MG SL tablet Place 0.4 mg under the tongue every 5 (five) minutes as needed for chest pain.     ondansetron (ZOFRAN) 4 MG tablet TAKE 1 TABLET(4 MG) BY MOUTH EVERY 8 HOURS AS NEEDED FOR NAUSEA OR VOMITING 20 tablet 0   rifaximin ([name]) 550 MG TABS tablet Take 1 tablet (550 mg total) by mouth 3 (three) times daily. 42 tablet 0   topiramate (TOPAMAX) 100 MG tablet TAKE 1 TABLET(100 MG) BY MOUTH TWICE DAILY 180 tablet 0   promethazine (PHENERGAN) 25 MG tablet Take 1 tablet (25 mg total) by mouth every 8 (eight) hours as needed for nausea or vomiting. 20 tablet 0   MYRBETRIQ 25 MG TB24 tablet TAKE 1 TABLET(25 MG) BY MOUTH DAILY (Patient not taking: Reported on 11/13/2021) 30 tablet 2   No facility-administered medications prior to visit.    Allergies  Allergen Reactions   Naratriptan     Ineffective    Review of Systems  Constitutional:  Negative for fever and malaise/fatigue.  HENT:  Negative for congestion.   Eyes:  Negative for blurred vision.  Respiratory:  Negative for shortness of breath.   Cardiovascular:  Negative for chest pain, palpitations and leg swelling.  Gastrointestinal:  Positive for abdominal pain (cramping pain), nausea and vomiting. Negative for blood in stool.  Genitourinary:  Negative for dysuria and frequency.  Musculoskeletal:  Negative for falls.  Skin:  Negative for rash.  Neurological:  Positive for dizziness and headaches. Negative for loss of consciousness.  Endo/Heme/Allergies:  Negative for environmental allergies.  Psychiatric/Behavioral:  Negative for depression. The patient is not nervous/anxious.        Objective:    Physical Exam Vitals and nursing note reviewed.  Constitutional:      General: She is not in acute distress.    Appearance: Normal appearance. She is well-developed. She is not ill-appearing.  HENT:     Head: Normocephalic and atraumatic.     Right Ear: External ear normal.     Left Ear:  External ear normal.     Mouth/Throat:     Mouth: Mucous membranes are dry.     Pharynx: Oropharynx is clear. No oropharyngeal exudate or posterior oropharyngeal erythema.  Eyes:     Extraocular Movements: Extraocular movements intact.     Conjunctiva/sclera: Conjunctivae normal.     Pupils: Pupils are equal, round, and reactive to light.  Neck:     Thyroid: No thyromegaly.     Vascular: No carotid bruit or JVD.  Cardiovascular:     Rate and Rhythm: Normal rate and regular rhythm.     Heart sounds: Normal heart sounds. No murmur heard.    No gallop.  Pulmonary:     Effort: Pulmonary effort is normal. No respiratory distress.     Breath sounds: Normal breath sounds. No wheezing or rales.  Chest:     Chest wall: No tenderness.  Abdominal:     General: Bowel sounds are normal.     Palpations: Abdomen is soft.     Tenderness: There is no abdominal tenderness. There is no guarding or rebound.  Musculoskeletal:     Cervical back: Normal range of motion and neck supple.  Skin:    General: Skin is warm and dry.  Neurological:     Mental Status: She is alert and oriented to person, place, and time.  Psychiatric:        Judgment: Judgment normal.  BP 104/78 (BP Location: Left Arm, Patient Position: Sitting, Cuff Size: Normal)   Pulse 88   Temp (!) 97.1 F (36.2 C) (Oral)   Resp 18   Ht 5' 4" (1.626 m)   Wt 121 lb 3.2 oz (55 kg)   SpO2 97%   BMI 20.80 kg/m  Wt Readings from Last 3 Encounters:  05/02/22 121 lb 3.2 oz (55 kg)  04/24/22 120 lb (54.4 kg)  04/02/22 128 lb (58.1 kg)    Diabetic Foot Exam - Simple   No data filed    Lab Results  Component Value Date   WBC 6.4 04/13/2021   HGB 13.3 04/13/2021   HCT 40.2 04/13/2021   PLT 369 04/13/2021   GLUCOSE 87 04/13/2021   CHOL 246 (H) 01/07/2017   TRIG 85.0 01/07/2017   HDL 55.30 01/07/2017   LDLCALC 173 (H) 01/07/2017   ALT 11 02/16/2021   AST 17 02/16/2021   NA 144 04/13/2021   K 4.1 04/13/2021   CL 108  (H) 04/13/2021   CREATININE 1.10 (H) 04/13/2021   BUN 15 04/13/2021   CO2 21 04/13/2021   TSH 1.81 04/11/2020   INR 0.98 11/24/2012   HGBA1C  02/19/2007    5.2 (NOTE)   The ADA recommends the following therapeutic goals for glycemic   control related to Hgb A1C measurement:   Goal of Therapy:   < 7.0% Hgb A1C   Action Suggested:  > 8.0% Hgb A1C   Ref:  Diabetes Care, 22, Suppl. 1, 1999    Lab Results  Component Value Date   TSH 1.81 04/11/2020   Lab Results  Component Value Date   WBC 6.4 04/13/2021   HGB 13.3 04/13/2021   HCT 40.2 04/13/2021   MCV 90 04/13/2021   PLT 369 04/13/2021   Lab Results  Component Value Date   NA 144 04/13/2021   K 4.1 04/13/2021   CO2 21 04/13/2021   GLUCOSE 87 04/13/2021   BUN 15 04/13/2021   CREATININE 1.10 (H) 04/13/2021   BILITOT 0.4 02/16/2021   ALKPHOS 51 04/20/2018   AST 17 02/16/2021   ALT 11 02/16/2021   PROT 7.0 02/16/2021   ALBUMIN 3.7 04/20/2018   CALCIUM 9.3 04/13/2021   ANIONGAP 9 04/20/2018   EGFR 56 (L) 04/13/2021   GFR 74.40 04/11/2020   Lab Results  Component Value Date   CHOL 246 (H) 01/07/2017   Lab Results  Component Value Date   HDL 55.30 01/07/2017   Lab Results  Component Value Date   LDLCALC 173 (H) 01/07/2017   Lab Results  Component Value Date   TRIG 85.0 01/07/2017   Lab Results  Component Value Date   CHOLHDL 4 01/07/2017   Lab Results  Component Value Date   HGBA1C  02/19/2007    5.2 (NOTE)   The ADA recommends the following therapeutic goals for glycemic   control related to Hgb A1C measurement:   Goal of Therapy:   < 7.0% Hgb A1C   Action Suggested:  > 8.0% Hgb A1C   Ref:  Diabetes Care, 22, Suppl. 1, 1999       Assessment & Plan:   Problem List Items Addressed This Visit       Unprioritized   Gastroenteritis - Primary    Diarrhea has stopped Phenergan 50 mg IM given and refilled po  If vomiting cont she must go to the ER Pt and friend understand and agree gatorade  pedialyte ,  clear liquids today  and start [place] diet tomorrow        Relevant Orders   CBC with Differential/Platelet   Comprehensive metabolic panel   TSH   Other Visit Diagnoses     Nausea and vomiting, unspecified vomiting type       Relevant Medications   promethazine (PHENERGAN) 25 MG tablet   promethazine (PHENERGAN) injection 50 mg (Completed)        Meds ordered this encounter  Medications   promethazine (PHENERGAN) 25 MG tablet    Sig: Take 1 tablet (25 mg total) by mouth every 8 (eight) hours as needed for nausea or vomiting.    Dispense:  20 tablet    Refill:  0   promethazine (PHENERGAN) injection 50 mg    I, Ann Held, DO, personally preformed the services described in this documentation.  All medical record entries made by the scribe were at my direction and in my presence.  I have reviewed the chart and discharge instructions (if applicable) and agree that the record reflects my personal performance and is accurate and complete. 05/02/2022   I,Shehryar Baig,acting as a scribe for Ann Held, DO.,have documented all relevant documentation on the behalf of Ann Held, DO,as directed by  Ann Held, DO while in the presence of Ann Held, DO.   Ann Held, DO

## 2022-05-03 LAB — COMPREHENSIVE METABOLIC PANEL
ALT: 13 U/L (ref 0–35)
AST: 19 U/L (ref 0–37)
Albumin: 4.4 g/dL (ref 3.5–5.2)
Alkaline Phosphatase: 86 U/L (ref 39–117)
BUN: 18 mg/dL (ref 6–23)
CO2: 22 mEq/L (ref 19–32)
Calcium: 9.2 mg/dL (ref 8.4–10.5)
Chloride: 98 mEq/L (ref 96–112)
Creatinine, Ser: 0.98 mg/dL (ref 0.40–1.20)
GFR: 60.41 mL/min (ref >60.00)
Glucose, Bld: 51 mg/dL — ABNORMAL LOW (ref 70–99)
Potassium: 4 mEq/L (ref 3.5–5.1)
Sodium: 137 mEq/L (ref 135–145)
Total Bilirubin: 0.4 mg/dL (ref 0.2–1.2)
Total Protein: 6.7 g/dL (ref 6.0–8.3)

## 2022-05-03 LAB — CBC WITH DIFFERENTIAL/PLATELET
Basophils Absolute: 0.2 10*3/uL — ABNORMAL HIGH (ref 0.0–0.1)
Basophils Relative: 2.3 % (ref 0.0–3.0)
Eosinophils Absolute: 0.1 10*3/uL (ref 0.0–0.7)
Eosinophils Relative: 1.2 % (ref 0.0–5.0)
HCT: 42.6 % (ref 36.0–46.0)
Hemoglobin: 13.9 g/dL (ref 12.0–15.0)
Lymphocytes Relative: 20.4 % (ref 12.0–46.0)
Lymphs Abs: 1.4 10*3/uL (ref 0.7–4.0)
MCHC: 32.6 g/dL (ref 30.0–36.0)
MCV: 92 fl (ref 78.0–100.0)
Monocytes Absolute: 0.6 10*3/uL (ref 0.1–1.0)
Monocytes Relative: 9.3 % (ref 3.0–12.0)
Neutro Abs: 4.5 10*3/uL (ref 1.4–7.7)
Neutrophils Relative %: 66.8 % (ref 43.0–77.0)
Platelets: 372 10*3/uL (ref 150.0–400.0)
RBC: 4.63 Mil/uL (ref 3.87–5.11)
RDW: 14 % (ref 11.5–15.5)
WBC: 6.8 10*3/uL (ref 4.0–10.5)

## 2022-05-03 LAB — TSH: TSH: 2.52 u[IU]/mL (ref 0.35–5.50)

## 2022-05-06 ENCOUNTER — Encounter: Payer: Self-pay | Admitting: Family Medicine

## 2022-05-06 NOTE — Telephone Encounter (Signed)
Pt stated she is doing better , she isnt vomitting anymore and she has been taking the nexium BID  She will follow up with GI

## 2022-05-10 ENCOUNTER — Other Ambulatory Visit: Payer: Self-pay | Admitting: Family

## 2022-05-10 DIAGNOSIS — J0140 Acute pansinusitis, unspecified: Secondary | ICD-10-CM

## 2022-05-30 ENCOUNTER — Other Ambulatory Visit: Payer: Self-pay | Admitting: Family

## 2022-05-30 DIAGNOSIS — J0140 Acute pansinusitis, unspecified: Secondary | ICD-10-CM

## 2022-06-10 ENCOUNTER — Other Ambulatory Visit: Payer: Self-pay | Admitting: Neurology

## 2022-06-10 ENCOUNTER — Other Ambulatory Visit: Payer: Self-pay | Admitting: Family

## 2022-06-10 DIAGNOSIS — J0140 Acute pansinusitis, unspecified: Secondary | ICD-10-CM

## 2022-06-13 ENCOUNTER — Other Ambulatory Visit (HOSPITAL_COMMUNITY): Payer: Self-pay

## 2022-06-14 ENCOUNTER — Other Ambulatory Visit (HOSPITAL_COMMUNITY): Payer: Self-pay

## 2022-06-21 ENCOUNTER — Encounter: Payer: Self-pay | Admitting: Gastroenterology

## 2022-06-21 ENCOUNTER — Other Ambulatory Visit (INDEPENDENT_AMBULATORY_CARE_PROVIDER_SITE_OTHER): Payer: Medicare Other

## 2022-06-21 ENCOUNTER — Ambulatory Visit (INDEPENDENT_AMBULATORY_CARE_PROVIDER_SITE_OTHER): Payer: Medicare Other | Admitting: Gastroenterology

## 2022-06-21 VITALS — BP 122/72 | HR 82 | Ht 64.0 in | Wt 119.0 lb

## 2022-06-21 DIAGNOSIS — R1084 Generalized abdominal pain: Secondary | ICD-10-CM

## 2022-06-21 DIAGNOSIS — K582 Mixed irritable bowel syndrome: Secondary | ICD-10-CM

## 2022-06-21 DIAGNOSIS — K219 Gastro-esophageal reflux disease without esophagitis: Secondary | ICD-10-CM

## 2022-06-21 DIAGNOSIS — K449 Diaphragmatic hernia without obstruction or gangrene: Secondary | ICD-10-CM

## 2022-06-21 DIAGNOSIS — K580 Irritable bowel syndrome with diarrhea: Secondary | ICD-10-CM | POA: Diagnosis not present

## 2022-06-21 DIAGNOSIS — R112 Nausea with vomiting, unspecified: Secondary | ICD-10-CM | POA: Diagnosis not present

## 2022-06-21 DIAGNOSIS — D509 Iron deficiency anemia, unspecified: Secondary | ICD-10-CM

## 2022-06-21 LAB — IBC + FERRITIN
Ferritin: 25.5 ng/mL (ref 10.0–291.0)
Iron: 123 ug/dL (ref 42–145)
Saturation Ratios: 40.1 % (ref 20.0–50.0)
TIBC: 306.6 ug/dL (ref 250.0–450.0)
Transferrin: 219 mg/dL (ref 212.0–360.0)

## 2022-06-21 LAB — VITAMIN D 25 HYDROXY (VIT D DEFICIENCY, FRACTURES): VITD: 16.24 ng/mL — ABNORMAL LOW (ref 30.00–100.00)

## 2022-06-21 MED ORDER — METOCLOPRAMIDE HCL 5 MG PO TABS: 5.0000 mg | ORAL_TABLET | Freq: Three times a day (TID) | ORAL | 1 refills | Status: DC

## 2022-06-21 NOTE — Patient Instructions (Addendum)
If you are age 66 or older, your body mass index should be between 23-30. Your Body mass index is 20.43 kg/m. If this is out of the aforementioned range listed, please consider follow up with your Primary Care Provider.  __________________________________________________________  The [facility] providers would like to encourage you to use [facility] to communicate with providers for non-urgent requests or questions.  Due to long hold times on the telephone, sending your provider a message by [facility] may be a faster and more efficient way to get a response.  Please allow 48 business hours for a response.  Please remember that this is for non-urgent requests.   Due to recent changes in healthcare laws, you may see the results of your imaging and laboratory studies on MyChart before your provider has had a chance to review them.  We understand that in some cases there may be results that are confusing or concerning to you. Not all laboratory results come back in the same time frame and the provider may be waiting for multiple results in order to interpret others.  Please give [place] 48 hours in order for your provider to thoroughly review all the results before contacting the office for clarification of your results.   Your provider has requested that you go to the basement level for lab work before leaving today. Press "B" on the elevator. The lab is located at the first door on the left as you exit the elevator.  We have sent the following medications to your pharmacy for you to pick up at your convenience: Reglan   Thank you for choosing me and [facility].  Vito Cirigliano, D.O.

## 2022-06-21 NOTE — Progress Notes (Signed)
Chief Complaint:    Abdominal pain, nausea/vomiting  GI History: Lindsay Frost is a 66 y.o. female follows in [facility] for multiple GI symptoms, to include reflux, lower abdominal pain, and change in bowel habits.   She describes a longstanding history of reflux, characterized by HB, regurgitation, sourbrash. Intermittent dry cough. +dysphagia for the last few years, pointing to lower sternal border.  Symptoms started worsening in 2020; increased postprandial and nocturnal HB. Worse with fruit, spicy food.  Has taken Dexilant 60 mg/day for years.  Takes Maalox for breakthrough sxs.  Has previously trialed Carafate, Tums, Prilosec.    Previously followed by Dr. 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   Additionally, she endorses changes in bowel habits, alternating between diarrhea and constipation since mid 2020. No preceding change in medications, diet, or activity. Intermittent BRB and marroon colored blood mixed in stool.  Intermittent lower abdominal cramping.  Not reliably improved with BM. Trialed stool softeners without change.    - 04/2018: Cholecystectomy -08/2018: CT abd/pelvis without intraabodminal process. - 2019: MRI brain: Benign hemangioma.  Patient reports some abnormality on prior spine imaging, and has been following with a spine surgeon. - 08/2020: Normal/negative GI PCR panel, C. difficile, CBC - 02/2021: GI PCR panel normal/negative.  Normal CBC, CMP, lipase - 05/2021: Normal gastrin level - 06/05/2021: GI follow-up appt.  Increased Dexilant to 60 mg bid with significant improvement in reflux.  Started Questran with improvement, but still nocturnal breakthrough abdominal pain improved with [name] - 07/19/2021: GI follow-up.  Increased cholestyramine to bid.  Trialed course of rifaximin 550 mg tid x14 days with good clinical response. - 04/02/2022: GI follow-up.  Partial response to repeat  course of rifaximin.  Continued intermittent generalized abdominal pain and diarrhea 1-2 days/week.  Started Questran  tri-weekly (constipation with daily dosing), trial Bentyl, low FODMAP diet, discussed possible TCA depending on response.  Dexilant denied by insurance.  Reflux treated with Nexium 40 mg bid.  Repeated course of [name].       Endoscopic history: - UGI series (11/30/2019): Small HH, nonspecific esophageal motility disorder.  Slightly irregular duodenal bulb/proximal duodenum suggesting scarring from prior ulcer disease -EGD with [facility] (08/2019, Dr. Bryan Lemma; on PPI): 3 cm HH, Hill grade 3 valve, LA Grade A esophagitis, mild non-H. pylori gastritis with single ulcer in antrum (biopsy: Gastritis with intestinal metaplasia), duodenal bulb diverticulum, stenosis in duodenal bulb dilated with endoscope alone; declines repeat EGD with GIM mapping -[facility] results (08/2019 on [place]): Significant esophageal acid exposure, DeMeester score 24.5, pH <4 in 9.6% of time.  Significant reflux despite Dexilant -Colonoscopy (08/2019, Dr. Bryan Lemma): Sigmoid diverticulosis, moderate stool, biopsies negative for [facility].  Repeat in 3 years due to suboptimal prep -EGD (07/2018, Dr. Benson Norway): Normal esophagus, 4 gastric ulcers, 1 duodenal ulcer, antral/duodenal bulb deformity with pyloric stenosis but easily traversable -EGD (12/2015, Dr. Benson Norway): LA Grade D esophagitis, 3 cm HH, 2 gastric ulcers, gastritis, mild duodenal stenosis.  Started on Dexilant - EGD (04/2014, Dr. Benson Norway): 3 cm HH, healing antral ulcer, duodenal bulb deformity with nonobstructive stricture -EGD (02/2014, Dr. Benson Norway): 3 cm HH, multiple clean-based ulcers in the antrum, posterior duodenal bulb stenosis but traversable -EGD (03/2012, Dr. Benson Norway): Antral ulcers, post bulbar stricture, dilated with 15 mm TTS balloon with mucosal rent -EGD (03/2012, Dr. Benson Norway): Antral ulcers, post bulbar stricture which was not traversed, unsuccessful balloon  dilation -Colonoscopy (07/2010, Dr. Benson Norway): Normal.  Repeat 10 years  HPI:     Patient is a 66 y.o. female presenting to the [facility] for follow-up.  Was last seen by me on 04/02/2022.  Main issue at that time was intermittent loose stools and abdominal pain 1-2 days/week with partial response to trial of [name] and probiotics.  Modified Questran.  Repeated course of [name].  Today, her main issue is abdominal pain and nausea for the last 6-7 weeks. Sxs started abruptly (with diarrhea which is subsequently resolved) and thought it was infectious.  Was seen by her [facility] for these symptoms in August and treated with Phenergan with improvement and was able to slowly improve her p.o. intake.  Symptoms have slowly started to recur.  Will wake up with nausea and non-bloody emesis approx 2 days/week with associated generalized mid abdominal pain.  No nocturnal sxs. Independent of food types.       Latest Ref Rng & Units 05/02/2022    3:18 PM 04/13/2021    3:44 PM 02/16/2021    3:06 PM  CBC  WBC 4.0 - 10.5 K/uL 6.8  6.4  4.9   Hemoglobin 12.0 - 15.0 g/dL 13.9  13.3  13.9   Hematocrit 36.0 - 46.0 % 42.6  40.2  42.2   Platelets 150.0 - 400.0 K/uL 372.0  369  350       Latest Ref Rng & Units 05/02/2022    3:18 PM 04/13/2021    3:44 PM 02/16/2021    3:06 PM  BMP  Glucose 70 - 99 mg/dL 51  87  89   BUN 6 - 23 mg/dL '18  15  10   '$ Creatinine 0.40 - 1.20 mg/dL 0.98  1.10  1.08   BUN/Creat Ratio 12 - '28  14  9   '$ Sodium 135 - 145 mEq/L 137  144  140   Potassium 3.5 - 5.1 mEq/L 4.0  4.1  4.1   Chloride 96 - 112 mEq/L 98  108  106   CO2 19 - 32 mEq/L '22  21  22   '$ Calcium 8.4 - 10.5 mg/dL 9.2  9.3  9.4      Review of systems:     No chest pain, no SOB, no fevers, no urinary sx   Past Medical History:  Diagnosis Date   Acute midline thoracic back pain 02/16/2021   Acute sinusitis 09/05/2014   Allergic rhinitis 07/16/2016   Angina pectoris ([place]) 07/25/2020   Arthritis    B12 deficiency  01/02/2021   Back pain    arthritis   Chronic sinusitis 12/05/2020   Depression    takes cymbalta daily   Diarrhea 02/16/2021   Duodenal stenosis    Epigastric pain 07/29/2018   Essential hypertension 05/14/2019   Gallstones    Generalized abdominal pain 02/16/2021   GERD (gastroesophageal reflux disease)    takes Omeprazole daily   Headache    Heart murmur    Hiatal hernia    History of gastric ulcer    History of migraine    last one about 2wks ago-takes Imitrex prn   History of seizures    Joint pain    Joint swelling    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   Meningioma ([place])    Migraine without aura and without status migrainosus, not intractable 01/13/2015   Mixed dyslipidemia 07/25/2020   Myringotomy tube status 02/27/2021   Nausea 02/16/2021   Nocturnal leg cramps 02/20/2015   Obesity (BMI 30-39.9) 07/22/2014   Osteoarthritis of left  knee 12/02/2012   Overactive bladder 01/02/2021   Peptic ulcer disease    Physical exam 07/22/2014   Recurrent acute serous otitis media of left ear 12/05/2020   Seizure-like activity ([place]) 05/12/2014    Patient's surgical history, family medical history, social history, medications and allergies were all reviewed in Epic    Current Outpatient Medications  Medication Sig Dispense Refill   alendronate (FOSAMAX) 70 MG tablet Take 1 tablet (70 mg total) by mouth every 7 (seven) days. Take with a full glass of water on an empty stomach. 4 tablet 11   aluminum-magnesium hydroxide-simethicone (MAALOX) 790-240-97 MG/5ML SUSP Take 30 mLs by mouth daily as needed for indigestion (indigestion).     amLODipine (NORVASC) 5 MG tablet TAKE 1 TABLET(5 MG) BY MOUTH DAILY 90 tablet 0   aspirin EC 81 MG tablet Take 1 tablet (81 mg total) by mouth daily. Swallow whole. 90 tablet 3   Aspirin-Salicylamide-Caffeine (BC HEADACHE PO) Take 1 packet by mouth daily as needed for headache (headaches).     atorvastatin  (LIPITOR) 20 MG tablet TAKE 1 TABLET(20 MG) BY MOUTH DAILY 90 tablet 1   azelastine (ASTELIN) 0.1 % nasal spray Place 2 sprays into both nostrils 2 (two) times daily. 30 mL 1   Bioflavonoid Products (VITAMIN C) CHEW Chew 1 tablet by mouth daily.     cholestyramine (QUESTRAN) 4 g packet Take 1 packet (4 g total) by mouth 2 (two) times daily. 60 each 3   dicyclomine (BENTYL) 10 MG capsule Take 1 capsule (10 mg total) by mouth every 6 (six) hours as needed for spasms. 30 capsule 3   eletriptan (RELPAX) 40 MG tablet TAKE 1 TABLET BY MOUTH AS NEEDED FOR MIGRAINE, REPEAT IN 2 HOURS IF SYMPTOMS PERSIST 10 tablet 5   esomeprazole (NEXIUM) 40 MG capsule Take 1 capsule (40 mg total) by mouth 2 (two) times daily. 180 capsule 2   fluticasone (FLONASE) 50 MCG/ACT nasal spray Place 2 sprays into both nostrils daily. 48 g 3   MYRBETRIQ 25 MG TB24 tablet TAKE 1 TABLET(25 MG) BY MOUTH DAILY (Patient not taking: Reported on 11/13/2021) 30 tablet 2   nitroGLYCERIN (NITROSTAT) 0.4 MG SL tablet Place 0.4 mg under the tongue every 5 (five) minutes as needed for chest pain.     ondansetron (ZOFRAN) 4 MG tablet TAKE 1 TABLET(4 MG) BY MOUTH EVERY 8 HOURS AS NEEDED FOR NAUSEA OR VOMITING 20 tablet 0   promethazine (PHENERGAN) 25 MG tablet Take 1 tablet (25 mg total) by mouth every 8 (eight) hours as needed for nausea or vomiting. 20 tablet 0   rifaximin ([name]) 550 MG TABS tablet Take 1 tablet (550 mg total) by mouth 3 (three) times daily. 42 tablet 0   topiramate (TOPAMAX) 100 MG tablet TAKE 1 TABLET(100 MG) BY MOUTH TWICE DAILY 180 tablet 0   No current facility-administered medications for this visit.    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  Plan for the following: - Reglan 5 mg tid.  To take scheduled prior to meals for the next  week.  If symptoms improving, can titrate to prn only - Patient to update me via MyChart in 1 week.  If symptoms ongoing, can evaluate with gastric emptying study +/- EGD - Slowly advance diet as tolerated  2) IBS-D 3) Generalized abdominal pain 4) Change in bowel habits - Continue Questran - Continue Bentyl - Previously trialed low FODMAP diet - Completed another course of rifaximin in July - We will treat more acute issues as above.  If continued generalized abdominal pain more related to her IBS, will plan for initiation of amitriptyline 10 mg qhs and titrate to effect  5) GERD 6) Hiatal hernia - Continue Nexium 40 mg bid - Continue antireflux lifestyle/dietary modifications  7) Iron deficiency anemia 8) Vitamin D deficiency - Labs in August with resolution of anemia - Repeat iron panel, ferritin, vitamin D        Oletta Buehring V Nyzir Dubois ,DO, FACG 06/21/2022, 2:53 PM

## 2022-06-27 ENCOUNTER — Other Ambulatory Visit: Payer: Self-pay

## 2022-06-27 DIAGNOSIS — E559 Vitamin D deficiency, unspecified: Secondary | ICD-10-CM

## 2022-06-27 MED ORDER — VITAMIN D (ERGOCALCIFEROL) 1.25 MG (50000 UNIT) PO CAPS: 50000.0000 [IU] | ORAL_CAPSULE | ORAL | 0 refills | Status: DC

## 2022-07-12 ENCOUNTER — Telehealth (INDEPENDENT_AMBULATORY_CARE_PROVIDER_SITE_OTHER): Payer: Medicare Other | Admitting: Family

## 2022-07-12 VITALS — BP 136/92 | HR 72

## 2022-07-12 DIAGNOSIS — J019 Acute sinusitis, unspecified: Secondary | ICD-10-CM

## 2022-07-12 MED ORDER — AMOXICILLIN-POT CLAVULANATE 875-125 MG PO TABS: 1.0000 | ORAL_TABLET | Freq: Two times a day (BID) | ORAL | 0 refills | Status: DC

## 2022-07-12 NOTE — Progress Notes (Signed)
Virtual telephone visit    Virtual Visit via Telephone Note   This visit type was conducted due to national recommendations for restrictions regarding the COVID-19 Pandemic (e.g. social distancing) in an effort to limit this patient's exposure and mitigate transmission in our community. Due to her co-morbid illnesses, this patient is at least at moderate risk for complications without adequate follow up. This format is felt to be most appropriate for this patient at this time. The patient did not have access to video technology or had technical difficulties with video requiring transitioning to audio format only (telephone). Physical exam was limited to content and character of the telephone converstion. CMA was able to get the patient set up on a telephone visit.   Patient location: Home Patient and provider in visit Provider location: Office  I discussed the limitations of evaluation and management by telemedicine and the availability of in person appointments. The patient expressed understanding and agreed to proceed.   Visit Date: 07/12/2022  Today's healthcare provider: [name], NP     Subjective:    Patient ID: Lindsay Frost, female    DOB: Dec 21, 1955, 66 y.o.   MRN: [id number]  Chief Complaint  Patient presents with   Cough    Complains of productive cough, tested negative for covid 3 days ago   Facial Pain    Complains of sinus pain   Migraine    Cough Associated symptoms include headaches (Sinus).  Migraine  Associated symptoms include coughing.   Patient is in today for a telephone office visit   Sick: She complains of coughing, sputum production, sneezing, sinus headache, and bloody nasal drainage that appeared about a week and a half ago. She states that for the last 3-4 nights, she had to sleep propped up. She has been using a vaporizer, Claritin, Delsym and 0.1% Astelin but symptoms are persistent.   Past Medical History:  Diagnosis Date    Acute midline thoracic back pain 02/16/2021   Acute sinusitis 09/05/2014   Allergic rhinitis 07/16/2016   Angina pectoris ([place]) 07/25/2020   Arthritis    B12 deficiency 01/02/2021   Back pain    arthritis   Chronic sinusitis 12/05/2020   Depression    takes cymbalta daily   Diarrhea 02/16/2021   Duodenal stenosis    Epigastric pain 07/29/2018   Essential hypertension 05/14/2019   Gallstones    Generalized abdominal pain 02/16/2021   GERD (gastroesophageal reflux disease)    takes Omeprazole daily   Headache    Heart murmur    Hiatal hernia    History of gastric ulcer    History of migraine    last one about 2wks ago-takes Imitrex prn   History of seizures    Joint pain    Joint swelling    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   Meningioma ([place])    Migraine without aura and without status migrainosus, not intractable 01/13/2015   Mixed dyslipidemia 07/25/2020   Myringotomy tube status 02/27/2021   Nausea 02/16/2021   Nocturnal leg cramps 02/20/2015   Obesity (BMI 30-39.9) 07/22/2014   Osteoarthritis of left knee 12/02/2012   Overactive bladder 01/02/2021   Peptic ulcer disease    Physical exam 07/22/2014   Recurrent acute serous otitis media of left ear 12/05/2020   Seizure-like activity ([place]) 05/12/2014    Past Surgical History:  Procedure Laterality Date   BALLOON DILATION  04/03/2012   Procedure: BALLOON DILATION;  Surgeon: Tory Emerald  Benson Norway, MD;  Location[facility];  Service: Endoscopy;  Laterality: N/A;   BIOPSY  08/24/2019   Procedure: BIOPSY;  Surgeon: Lavena Bullion, DO;  Location: [facility];  Service: Gastroenterology;;  EGD and COLON   BRAVO [place] STUDY N/A 08/24/2019   Procedure: BRAVO [place];  Surgeon: Lavena Bullion, DO;  Location: [facility];  Service: Gastroenterology;  Laterality: N/A;   CHOLECYSTECTOMY N/A 04/24/2018   Procedure: LAPAROSCOPIC CHOLECYSTECTOMY;  Surgeon: Clovis Riley, MD;  Location: [facility]  [facility];  Service: General;  Laterality: N/A;   COLONOSCOPY     COLONOSCOPY WITH PROPOFOL N/A 08/24/2019   Procedure: COLONOSCOPY WITH PROPOFOL;  Surgeon: Lavena Bullion, DO;  [facility]: [facility];  Service: Gastroenterology;  Laterality: N/A;   DIAGNOSTIC LAPAROSCOPY  10yr ago   ESOPHAGEAL MANOMETRY N/A 09/04/2015   Procedure: ESOPHAGEAL MANOMETRY (EM);  Surgeon: PCarol Ada MD;  [facility]: [facility];  Service: Endoscopy;  Laterality: N/A;   ESOPHAGOGASTRODUODENOSCOPY  04/03/2012   Procedure: ESOPHAGOGASTRODUODENOSCOPY (EGD);  Surgeon: PBeryle Beams MD;  [facility];  Service: Endoscopy;  Laterality: N/A;  EGD w/ balloon dilation   ESOPHAGOGASTRODUODENOSCOPY (EGD) WITH PROPOFOL N/A 02/18/2014   Procedure: ESOPHAGOGASTRODUODENOSCOPY (EGD) WITH PROPOFOL;  Surgeon: PBeryle Beams MD;  [facility];  Service: Endoscopy;  Laterality: N/A;   ESOPHAGOGASTRODUODENOSCOPY (EGD) WITH PROPOFOL N/A 04/22/2014   Procedure: ESOPHAGOGASTRODUODENOSCOPY (EGD) WITH PROPOFOL;  Surgeon: PBeryle Beams MD;  [facility];  Service: Endoscopy;  Laterality: N/A;   ESOPHAGOGASTRODUODENOSCOPY (EGD) WITH PROPOFOL N/A 08/24/2019   Procedure: ESOPHAGOGASTRODUODENOSCOPY (EGD) WITH PROPOFOL;  Surgeon: CLavena Bullion DO;  [facility];  Service: Gastroenterology;  Laterality: N/A;   LEFT HEART CATH AND CORONARY ANGIOGRAPHY N/A 04/18/2021   Procedure: LEFT HEART CATH AND CORONARY ANGIOGRAPHY;  Surgeon: BLorretta Harp MD;  [facility];  Service: Cardiovascular;  Laterality: N/A;   NASAL SEPTUM SURGERY  09/2016   [place] 09/04/2015   Procedure: [place];  Surgeon: PCarol Ada MD;  [facility];  Service: Endoscopy;  Laterality: N/A;   REPLACEMENT TOTAL KNEE Right 6[address]yrago   TOTAL KNEE ARTHROPLASTY Left 11/30/2012   Procedure: LEFT TOTAL KNEE ARTHROPLASTY;  Surgeon: FrKerin SalenMD;  [facility]: [facility];   Laterality: Left;    Family History  Problem Relation Age of Onset   Arthritis Mother    Cancer Mother        breast   Heart disease Mother        died from "heart problems" at age 6115had CAD   Depression Mother    Heart disease Father        had CABG   Kidney disease Father    Arthritis Sister    Heart murmur Sister    Arthritis Sister    Aneurysm Sister    Arthritis Brother        "serious back/neck problems"   Healthy Daughter    Colon cancer Neg Hx    Esophageal cancer Neg Hx    Pancreatic cancer Neg Hx    Stomach cancer Neg Hx     Social History   Socioeconomic History   Marital status: Widowed    Spouse name: Not on file   Number of children: 1   Years of education: Not on file   Highest education level: Some college, no degree  Occupational History   Occupation: Retired  Tobacco Use   Smoking status: Never  Smokeless tobacco: Never  Vaping Use   Vaping Use: Never used  Substance and Sexual Activity   Alcohol use: No   Drug use: No   Sexual activity: Not Currently    Birth control/protection: Post-menopausal  Other Topics Concern   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, mining   Single   1 daughter- lives in [place]   2 dogs   Enjoys reading, watching television   Pt is right handed   Drinks coffee once a week, green tea daily, soda sometimes    Social Determinants of Health   Financial Resource Strain: Low Risk  (11/09/2021)   Overall Financial Resource Strain (CARDIA)    Difficulty of Paying Living Expenses: Not hard at all  Food Insecurity: No Food Insecurity (11/09/2021)   Hunger Vital Sign    Worried About Running Out of Food in the Last Year: Never true    [place] in the Last Year: Never true  Transportation Needs: No Transportation Needs (11/09/2021)   PRAPARE - Hydrologist (Medical): No    Lack of Transportation (Non-Medical): No   Physical Activity: Inactive (11/09/2021)   Exercise Vital Sign    Days of Exercise per Week: 0 days    Minutes of Exercise per Session: 0 min  Stress: No Stress Concern Present (11/09/2021)   [place]    Feeling of Stress : Not at all  Social Connections: Moderately Isolated (11/09/2021)   Social Connection and Isolation Panel [NHANES]    Frequency of Communication with Friends and Family: Three times a week    Frequency of Social Gatherings with Friends and Family: More than three times a week    Attends Religious Services: More than 4 times per year    Active Member of Clubs or Organizations: No    Attends Archivist Meetings: Never    Marital Status: Widowed  Intimate Partner Violence: Not At Risk (11/09/2021)   Humiliation, Afraid, Rape, and Kick questionnaire    Fear of Current or Ex-Partner: No    Emotionally Abused: No    Physically Abused: No    Sexually Abused: No    Outpatient Medications Prior to Visit  Medication Sig Dispense Refill   alendronate (FOSAMAX) 70 MG tablet Take 1 tablet (70 mg total) by mouth every 7 (seven) days. Take with a full glass of water on an empty stomach. 4 tablet 11   aluminum-magnesium hydroxide-simethicone (MAALOX) 182-993-71 MG/5ML SUSP Take 30 mLs by mouth daily as needed for indigestion (indigestion).     amLODipine (NORVASC) 5 MG tablet TAKE 1 TABLET(5 MG) BY MOUTH DAILY 90 tablet 0   aspirin EC 81 MG tablet Take 1 tablet (81 mg total) by mouth daily. Swallow whole. 90 tablet 3   Aspirin-Salicylamide-Caffeine (BC HEADACHE PO) Take 1 packet by mouth daily as needed for headache (headaches).     atorvastatin (LIPITOR) 20 MG tablet TAKE 1 TABLET(20 MG) BY MOUTH DAILY 90 tablet 1   azelastine (ASTELIN) 0.1 % nasal spray Place 2 sprays into both nostrils 2 (two) times daily. 30 mL 1   Bioflavonoid Products (VITAMIN C) CHEW Chew 1 tablet by mouth daily.     cholestyramine  (QUESTRAN) 4 g packet Take 1 packet (4 g total) by mouth 2 (two) times daily. 60 each 3   dicyclomine (BENTYL) 10 MG capsule Take 1 capsule (  10 mg total) by mouth every 6 (six) hours as needed for spasms. 30 capsule 3   eletriptan (RELPAX) 40 MG tablet TAKE 1 TABLET BY MOUTH AS NEEDED FOR MIGRAINE, REPEAT IN 2 HOURS IF SYMPTOMS PERSIST 10 tablet 5   esomeprazole (NEXIUM) 40 MG capsule Take 1 capsule (40 mg total) by mouth 2 (two) times daily. 180 capsule 2   fluticasone (FLONASE) 50 MCG/ACT nasal spray Place 2 sprays into both nostrils daily. 48 g 3   metoCLOPramide (REGLAN) 5 MG tablet Take 1 tablet (5 mg total) by mouth 3 (three) times daily before meals. 60 tablet 1   MYRBETRIQ 25 MG TB24 tablet TAKE 1 TABLET(25 MG) BY MOUTH DAILY 30 tablet 2   nitroGLYCERIN (NITROSTAT) 0.4 MG SL tablet Place 0.4 mg under the tongue every 5 (five) minutes as needed for chest pain.     ondansetron (ZOFRAN) 4 MG tablet TAKE 1 TABLET(4 MG) BY MOUTH EVERY 8 HOURS AS NEEDED FOR NAUSEA OR VOMITING 20 tablet 0   promethazine (PHENERGAN) 25 MG tablet Take 1 tablet (25 mg total) by mouth every 8 (eight) hours as needed for nausea or vomiting. 20 tablet 0   rifaximin ([name]) 550 MG TABS tablet Take 1 tablet (550 mg total) by mouth 3 (three) times daily. 42 tablet 0   topiramate (TOPAMAX) 100 MG tablet TAKE 1 TABLET(100 MG) BY MOUTH TWICE DAILY 180 tablet 0   Vitamin D, Ergocalciferol, (DRISDOL) 1.25 MG (50000 UNIT) CAPS capsule Take 1 capsule (50,000 Units total) by mouth every 7 (seven) days. 8 capsule 0   No facility-administered medications prior to visit.    Allergies  Allergen Reactions   Naratriptan     Ineffective    Review of Systems  HENT:         (+) Bloody Nasal Drainage  Respiratory:  Positive for cough and sputum production.        (+) Sneezing  Neurological:  Positive for headaches (Sinus).       Objective:    Physical Exam  BP (!) 136/92   Pulse 72  Wt Readings from Last 3 Encounters:   06/21/22 119 lb (54 kg)  05/02/22 121 lb 3.2 oz (55 kg)  04/24/22 120 lb (54.4 kg)   Gen: Awake, alert, no acute distress Resp: Breathing sounds even and non-labored Psych: calm/pleasant demeanor Neuro: Alert and Oriented x 3, speech is clear.       Assessment & Plan:   Problem List Items Addressed This Visit       Unprioritized   Acute sinusitis - Primary    New.  Will rx with augmentin bid x 10 days. She will continue otc supportive measures and is advised to schedule a follow up in person visit if symptoms worsen or fail to improve in the next 3 days.        Relevant Medications   amoxicillin-clavulanate (AUGMENTIN) 875-125 MG tablet   Meds ordered this encounter  Medications   amoxicillin-clavulanate (AUGMENTIN) 875-125 MG tablet    Sig: Take 1 tablet by mouth 2 (two) times daily.    Dispense:  20 tablet    Refill:  0    Order Specific Question:   Supervising Provider    Answer:   Penni Homans A [[id number]]   7 minutes spent on today's telephone visit.  I discussed the assessment and treatment plan with the patient. The patient was provided an opportunity to ask questions and all were answered. The patient agreed with the plan and  demonstrated an understanding of the instructions.   The patient was advised to call back or seek an in-person evaluation if the symptoms worsen or if the condition fails to improve as anticipated.  I provided 20 minutes of non-face-to-face time during this encounter.   I,Amber Collins,acting as a Education administrator for [employer], NP.,have documented all relevant documentation on the behalf of [name], NP,as directed by  [name], NP while in the presence of [name], NP.   [name], NP [employer] [facility] [employer] [phone] (phone) [phone] (fax)  [place]

## 2022-07-12 NOTE — Assessment & Plan Note (Signed)
New.  Will rx with augmentin bid x 10 days. She will continue otc supportive measures and is advised to schedule a follow up in person visit if symptoms worsen or fail to improve in the next 3 days.

## 2022-07-15 DIAGNOSIS — H6122 Impacted cerumen, left ear: Secondary | ICD-10-CM | POA: Insufficient documentation

## 2022-07-15 DIAGNOSIS — Z9622 Myringotomy tube(s) status: Secondary | ICD-10-CM | POA: Diagnosis not present

## 2022-07-15 DIAGNOSIS — H9193 Unspecified hearing loss, bilateral: Secondary | ICD-10-CM | POA: Insufficient documentation

## 2022-07-17 ENCOUNTER — Other Ambulatory Visit: Payer: Self-pay | Admitting: Family

## 2022-07-20 ENCOUNTER — Other Ambulatory Visit: Payer: Self-pay | Admitting: Family

## 2022-07-22 DIAGNOSIS — H938X2 Other specified disorders of left ear: Secondary | ICD-10-CM | POA: Diagnosis not present

## 2022-07-22 DIAGNOSIS — H6992 Unspecified Eustachian tube disorder, left ear: Secondary | ICD-10-CM | POA: Insufficient documentation

## 2022-07-22 DIAGNOSIS — Z9622 Myringotomy tube(s) status: Secondary | ICD-10-CM | POA: Diagnosis not present

## 2022-08-06 DIAGNOSIS — H903 Sensorineural hearing loss, bilateral: Secondary | ICD-10-CM | POA: Diagnosis not present

## 2022-08-20 ENCOUNTER — Other Ambulatory Visit: Payer: Self-pay | Admitting: Gastroenterology

## 2022-08-20 ENCOUNTER — Encounter: Payer: Self-pay | Admitting: Gastroenterology

## 2022-08-20 ENCOUNTER — Other Ambulatory Visit: Payer: Self-pay

## 2022-08-20 MED ORDER — FAMOTIDINE 40 MG PO TABS: 40.0000 mg | ORAL_TABLET | Freq: Two times a day (BID) | ORAL | 2 refills | Status: DC

## 2022-08-20 MED ORDER — SUCRALFATE 1 G PO TABS: 1.0000 g | ORAL_TABLET | Freq: Three times a day (TID) | ORAL | 2 refills | Status: DC

## 2022-08-20 NOTE — Telephone Encounter (Signed)
She has been on multiple medications for her GERD in the past, including high-dose Dexilant.  Previous EGD with [facility] in 2020 demonstrated continued breakthrough reflux despite being on Dexilant.  Can try for the following:  - Carafate 1 g 4 times daily - If no improvement, will exchange her esomeprazole for Aciphex 20 mg twice daily - Pepcid 40 mg twice daily - Schedule follow-up appointment in GI clinic.  May need to discuss whether or not antireflux surgery and hiatal hernia repair is a appropriate next step.  We have been hesitant about this surgical option due to her duodenal stenosis as a relative contraindication, but will be worth further discussion.  Can also refer to Dr. Redmond Pulling in the surgical clinic to discuss this at further length and discussed possibly laparoscopic hiatal hernia repair and toupee fundoplication instead of TIF

## 2022-09-12 ENCOUNTER — Telehealth: Payer: Self-pay | Admitting: *Deleted

## 2022-09-12 NOTE — Telephone Encounter (Signed)
===  View-only below this line=== NOTED-Lindsay Frost   ----- Message ----- From: Lavena Bullion, DO Sent: 09/06/2022   4:12 PM EST To: Oda Kilts, CMA Subject: RE: Recall                                     Last colonoscopy was 08/2019 and suboptimal bowel prep.  Recommended repeat colonoscopy in 3 years.  So recall would be for colonoscopy at the very least.    Agree, she is also due for repeat upper endoscopy for GIM mapping, but she previously told me that she did not want to proceed with that, which is not unreasonable.  At the time of our last appointment, she was dealing with nausea/vomiting and possibly some breakthrough reflux symptoms.  So I she requires EGD for that, I would recommend doing at same time as  the colonoscopy.  I believe she is scheduled for an OV with me on 09/27/2022.  How about we plan to discuss that at the appointment so we can get the right plan for her?  Thanks. ----- Message ----- From: Oda Kilts, CMA Sent: 09/06/2022   3:54 PM EST To: Lavena Bullion, DO Subject: Recall                                         Can you check out this patient ? I have a recall on her for a colonoscopy for 08/2022. I believe the recall should have been for a Endoscopy for Gastric mapping.  Thanks.... [name]

## 2022-09-17 ENCOUNTER — Other Ambulatory Visit: Payer: Self-pay | Admitting: Gastroenterology

## 2022-09-27 ENCOUNTER — Ambulatory Visit (INDEPENDENT_AMBULATORY_CARE_PROVIDER_SITE_OTHER): Payer: Medicare Other | Admitting: Gastroenterology

## 2022-09-27 ENCOUNTER — Encounter: Payer: Self-pay | Admitting: Gastroenterology

## 2022-09-27 VITALS — BP 130/80 | HR 75 | Ht 64.0 in | Wt 111.8 lb

## 2022-09-27 DIAGNOSIS — K2100 Gastro-esophageal reflux disease with esophagitis, without bleeding: Secondary | ICD-10-CM | POA: Diagnosis not present

## 2022-09-27 DIAGNOSIS — K580 Irritable bowel syndrome with diarrhea: Secondary | ICD-10-CM | POA: Diagnosis not present

## 2022-09-27 DIAGNOSIS — K449 Diaphragmatic hernia without obstruction or gangrene: Secondary | ICD-10-CM

## 2022-09-27 DIAGNOSIS — K315 Obstruction of duodenum: Secondary | ICD-10-CM | POA: Diagnosis not present

## 2022-09-27 NOTE — Progress Notes (Signed)
Chief Complaint:    GERD, heartburn, hiatal hernia  GI History: AILSA MIRELES is a 67 y.o. female follows in the [facility] for multiple GI symptoms, to include reflux, lower abdominal pain, and change in bowel habits.   She describes a longstanding history of reflux, characterized by HB, regurgitation, sourbrash. Intermittent dry cough. +dysphagia for the last few years, pointing to lower sternal border.  Symptoms started worsening in 2020; increased postprandial and nocturnal HB. Worse with fruit, spicy food.  Has taken Dexilant 60 mg/day for years.  Takes Maalox for breakthrough sxs.  Has previously trialed Carafate, Tums, Prilosec.    Previously followed by Dr. 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   Additionally, she endorses changes in bowel habits, alternating between diarrhea and constipation since mid 2020. No preceding change in medications, diet, or activity. Intermittent BRB and marroon colored blood mixed in stool.  Intermittent lower abdominal cramping.  Not reliably improved with BM. Trialed stool softeners without change.    - 04/2018: Cholecystectomy -08/2018: CT abd/pelvis without intraabodminal process. - 2019: MRI brain: Benign hemangioma.  Patient reports some abnormality on prior spine imaging, and has been following with a spine surgeon. - 08/2020: Normal/negative GI PCR panel, C. difficile, CBC - 02/2021: GI PCR panel normal/negative.  Normal CBC, CMP, lipase - 05/2021: Normal gastrin level - 06/05/2021: GI follow-up appt.  Increased Dexilant to 60 mg bid with significant improvement in reflux.  Started Questran with improvement, but still nocturnal breakthrough abdominal pain improved with [name] - 07/19/2021: GI follow-up.  Increased cholestyramine to bid.  Trialed course of rifaximin 550 mg tid x14 days with good clinical response. - 04/02/2022: GI follow-up.  Partial response to repeat  course of rifaximin.  Continued intermittent generalized abdominal pain and diarrhea 1-2 days/week.  Started Questran  tri-weekly (constipation with daily dosing), trial Bentyl, low FODMAP diet, discussed possible TCA depending on response.  Dexilant denied by insurance.  Reflux treated with Nexium 40 mg bid.  Repeated course of [name]. - 06/21/2022: GI follow-up.  Trial course of scheduled Reglan with overall improvement.  Improvement in IBS-D and generalized abdominal pain after completion of repeat [name] course       Endoscopic history: - UGI series (11/30/2019): Small HH, nonspecific esophageal motility disorder.  Slightly irregular duodenal bulb/proximal duodenum suggesting scarring from prior ulcer disease -EGD with [facility] (08/2019, Dr. Bryan Lemma; on PPI): 3 cm HH, Hill grade 3 valve, LA Grade A esophagitis, mild non-H. pylori gastritis with single ulcer in antrum (biopsy: Gastritis with intestinal metaplasia), duodenal bulb diverticulum, stenosis in duodenal bulb dilated with endoscope alone; declines repeat EGD with GIM mapping -[facility] results (08/2019 on [facility]): Significant esophageal acid exposure, DeMeester score 24.5, pH <4 in 9.6% of time.  Significant reflux despite Dexilant -Colonoscopy (08/2019, Dr. Bryan Lemma): Sigmoid diverticulosis, moderate stool, biopsies negative for [facility].  Repeat in 3 years due to suboptimal prep -EGD (07/2018, Dr. Benson Norway): Normal esophagus, 4 gastric ulcers, 1 duodenal ulcer, antral/duodenal bulb deformity with pyloric stenosis but easily traversable -EGD (12/2015, Dr. Benson Norway): LA Grade D esophagitis, 3 cm HH, 2 gastric ulcers, gastritis, mild duodenal stenosis.  Started on Dexilant - EGD (04/2014, Dr. Benson Norway): 3 cm HH, healing antral ulcer, duodenal bulb deformity with nonobstructive stricture -EGD (02/2014, Dr. Benson Norway): 3 cm HH, multiple clean-based ulcers in the antrum, posterior duodenal bulb stenosis but traversable -EGD (03/2012, Dr. Benson Norway): Antral ulcers, post bulbar  stricture, dilated with 15 mm TTS balloon  with mucosal rent -EGD (03/2012, Dr. Benson Norway): Antral ulcers, post bulbar stricture which was not traversed, unsuccessful balloon dilation -Colonoscopy (07/2010, Dr. Benson Norway): Normal.  Repeat 10 years  HPI:     Patient is a 67 y.o. female presenting to the [facility]astroenterology Clinic for follow-up.  Was last seen by me on 06/21/2022 as outlined above.  Currently taking Nexium 40 mg BID and Pepcid 40 mg BID but still with regular breakthrough HB. Recently trialed Carafate w/ some short duration improvement. Aciphex not approved by insurance. Regugitation and nausea/vomiting since starting Reglan which she has since been able to titrate to on-demand;  now takes prn (0-1 times/day).   Review of systems:     No chest pain, no SOB, no fevers, no urinary sx   Past Medical History:  Diagnosis Date   Acute midline thoracic back pain 02/16/2021   Acute sinusitis 09/05/2014   Allergic rhinitis 07/16/2016   Angina pectoris ([place]) 07/25/2020   Arthritis    B12 deficiency 01/02/2021   Back pain    arthritis   Chronic sinusitis 12/05/2020   Depression    takes cymbalta daily   Diarrhea 02/16/2021   Duodenal stenosis    Epigastric pain 07/29/2018   Essential hypertension 05/14/2019   Gallstones    Generalized abdominal pain 02/16/2021   GERD (gastroesophageal reflux disease)    takes Omeprazole daily   Headache    Heart murmur    Hiatal hernia    History of gastric ulcer    History of migraine    last one about 2wks ago-takes Imitrex prn   History of seizures    Joint pain    Joint swelling    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   Meningioma ([place])    Migraine without aura and without status migrainosus, not intractable 01/13/2015   Mixed dyslipidemia 07/25/2020   Myringotomy tube status 02/27/2021   Nausea 02/16/2021   Nocturnal leg cramps 02/20/2015   Obesity (BMI 30-39.9) 07/22/2014   Osteoarthritis of left knee  12/02/2012   Overactive bladder 01/02/2021   Peptic ulcer disease    Physical exam 07/22/2014   Recurrent acute serous otitis media of left ear 12/05/2020   Seizure-like activity ([place]) 05/12/2014    Patient's surgical history, family medical history, social history, medications and allergies were all reviewed in Epic    Current Outpatient Medications  Medication Sig Dispense Refill   alendronate (FOSAMAX) 70 MG tablet Take 1 tablet (70 mg total) by mouth every 7 (seven) days. Take with a full glass of water on an empty stomach. 4 tablet 11   aluminum-magnesium hydroxide-simethicone (MAALOX) 654-650-35 MG/5ML SUSP Take 30 mLs by mouth daily as needed for indigestion (indigestion).     amLODipine (NORVASC) 5 MG tablet TAKE 1 TABLET(5 MG) BY MOUTH DAILY 90 tablet 0   amoxicillin-clavulanate (AUGMENTIN) 875-125 MG tablet Take 1 tablet by mouth 2 (two) times daily. 20 tablet 0   aspirin EC 81 MG tablet Take 1 tablet (81 mg total) by mouth daily. Swallow whole. 90 tablet 3   Aspirin-Salicylamide-Caffeine (BC HEADACHE PO) Take 1 packet by mouth daily as needed for headache (headaches).     atorvastatin (LIPITOR) 20 MG tablet TAKE 1 TABLET(20 MG) BY MOUTH DAILY 90 tablet 1   azelastine (ASTELIN) 0.1 % nasal spray Place 2 sprays into both nostrils 2 (two) times daily. 30 mL 1   Bioflavonoid Products (VITAMIN C) CHEW Chew 1 tablet by mouth daily.     cholestyramine (QUESTRAN) 4  g packet Take 1 packet (4 g total) by mouth 2 (two) times daily. 60 each 3   dicyclomine (BENTYL) 10 MG capsule Take 1 capsule (10 mg total) by mouth every 6 (six) hours as needed for spasms. 30 capsule 3   eletriptan (RELPAX) 40 MG tablet TAKE 1 TABLET BY MOUTH AS NEEDED FOR MIGRAINE, REPEAT IN 2 HOURS IF SYMPTOMS PERSIST 10 tablet 5   esomeprazole (NEXIUM) 40 MG capsule Take 1 capsule (40 mg total) by mouth 2 (two) times daily. 180 capsule 2   famotidine (PEPCID) 40 MG tablet Take 1 tablet (40 mg total) by mouth 2 (two) times  daily. 60 tablet 2   fluticasone (FLONASE) 50 MCG/ACT nasal spray Place 2 sprays into both nostrils daily. 48 g 3   metoCLOPramide (REGLAN) 5 MG tablet TAKE 1 TABLET(5 MG) BY MOUTH THREE TIMES DAILY BEFORE MEALS AS NEEDED 60 tablet 1   MYRBETRIQ 25 MG TB24 tablet TAKE 1 TABLET(25 MG) BY MOUTH DAILY 30 tablet 2   nitroGLYCERIN (NITROSTAT) 0.4 MG SL tablet Place 0.4 mg under the tongue every 5 (five) minutes as needed for chest pain.     ondansetron (ZOFRAN) 4 MG tablet TAKE 1 TABLET(4 MG) BY MOUTH EVERY 8 HOURS AS NEEDED FOR NAUSEA OR VOMITING 20 tablet 0   rifaximin ([name]) 550 MG TABS tablet Take 1 tablet (550 mg total) by mouth 3 (three) times daily. 42 tablet 0   sucralfate (CARAFATE) 1 g tablet Take 1 tablet (1 g total) by mouth 4 (four) times daily -  with meals and at bedtime. 120 tablet 2   topiramate (TOPAMAX) 100 MG tablet TAKE 1 TABLET(100 MG) BY MOUTH TWICE DAILY 180 tablet 0   Vitamin D, Ergocalciferol, (DRISDOL) 1.25 MG (50000 UNIT) CAPS capsule TAKE 1 CAPSULE BY MOUTH EVERY 7 DAYS 8 capsule 0   No current facility-administered medications for this visit.    Physical Exam:     BP 130/80   Pulse 75   Ht '5\' 4"'$  (1.626 m)   Wt 111 lb 12.8 oz (50.7 kg)   BMI 19.19 kg/m   GENERAL:  Pleasant female in NAD PSYCH: : Cooperative, normal affect  NEURO: Alert and oriented x 3, no focal neurologic deficits   IMPRESSION and PLAN:    1) GERD 2) Hiatal hernia Continues to have reflux symptoms despite high-dose PPI and high-dose H2 blockers, presumably due to poor anatomic function 2/2 hiatal hernia and significant LES laxity.  Would likely do well with hiatal hernia pair and antireflux surgery, but we did discuss the possibility of her duodenal bulb stenosis causing issues after antireflux surgery.  Instead of cTIF, I would like her to see Dr. Redmond Pulling to discuss pure surgical approach for antireflux surgery which may or may not include bypass options due to the duodenal stricture. -  Referral to Dr. Redmond Pulling - Will probably need repeat EGD at some point soon to reassess the duodenal stricture prior to any surgical intervention, but I will await Dr. Dois Davenport evaluation first - Continue PPI and H2 blocker as prescribed - Continue antireflux lifestyle/dietary modifications with avoidance of exacerbating foods  3) Nausea/Vomiting - Much improved trial of scheduled Reglan, and has now titrated back to prn basis only - Continue current management - Continue current diet - Could consider gastric emptying study if planning on any antireflux surgery.  Will defer to Dr. Redmond Pulling evaluating first before ordering  4) IBS-D - Well-controlled on current therapy - Continue current regimen  5) Duodenal stricture -  Need to consider repeat upper endoscopy to evaluate  I spent 30 minutes of time, including in depth chart review, independent review of results as outlined above, communicating results with the patient directly, face-to-face time with the patient, coordinating care, and ordering studies and medications as appropriate, and documentation.         Lavena Bullion ,DO, FACG 09/27/2022, 10:29 AM

## 2022-09-27 NOTE — Patient Instructions (Signed)
You will be contacted by [facility] to schedule a consult.  [facility] is located at [address].   Phone No# [phone].    If you are age 67 or older, your body mass index should be between 23-30. Your Body mass index is 19.19 kg/m. If this is out of the aforementioned range listed, please consider follow up with your Primary Care Provider.  __________________________________________________________  The [facility] providers would like to encourage you to use [facility] to communicate with providers for non-urgent requests or questions.  Due to long hold times on the telephone, sending your provider a message by [facility] may be a faster and more efficient way to get a response.  Please allow 48 business hours for a response.  Please remember that this is for non-urgent requests.   Due to recent changes in healthcare laws, you may see the results of your imaging and laboratory studies on MyChart before your provider has had a chance to review them.  We understand that in some cases there may be results that are confusing or concerning to you. Not all laboratory results come back in the same time frame and the provider may be waiting for multiple results in order to interpret others.  Please give [place] 48 hours in order for your provider to thoroughly review all the results before contacting the office for clarification of your results.     Thank you for choosing me and [facility].  Vito Cirigliano, D.O.

## 2022-09-30 ENCOUNTER — Telehealth: Payer: Self-pay

## 2022-09-30 NOTE — Telephone Encounter (Signed)
-----  Message from Marice Potter, RN sent at 06/27/2022 10:13 AM EDT ----- Pt needs repeat vitamin D lab. Order in epic.

## 2022-09-30 NOTE — Telephone Encounter (Signed)
Spoke with pt and let pt know about lab that is due. Pt verbalized understanding and had no other concerns at end of call.

## 2022-10-02 ENCOUNTER — Other Ambulatory Visit (INDEPENDENT_AMBULATORY_CARE_PROVIDER_SITE_OTHER): Payer: Medicare Other

## 2022-10-02 DIAGNOSIS — E559 Vitamin D deficiency, unspecified: Secondary | ICD-10-CM

## 2022-10-02 LAB — VITAMIN D 25 HYDROXY (VIT D DEFICIENCY, FRACTURES): VITD: 43.23 ng/mL (ref 30.00–100.00)

## 2022-10-10 ENCOUNTER — Encounter: Payer: Self-pay | Admitting: Gastroenterology

## 2022-10-10 DIAGNOSIS — H90A32 Mixed conductive and sensorineural hearing loss, unilateral, left ear with restricted hearing on the contralateral side: Secondary | ICD-10-CM | POA: Insufficient documentation

## 2022-10-10 DIAGNOSIS — R42 Dizziness and giddiness: Secondary | ICD-10-CM | POA: Insufficient documentation

## 2022-10-10 DIAGNOSIS — H6505 Acute serous otitis media, recurrent, left ear: Secondary | ICD-10-CM | POA: Diagnosis not present

## 2022-10-10 DIAGNOSIS — Z9889 Other specified postprocedural states: Secondary | ICD-10-CM | POA: Diagnosis not present

## 2022-10-10 DIAGNOSIS — H6592 Unspecified nonsuppurative otitis media, left ear: Secondary | ICD-10-CM | POA: Insufficient documentation

## 2022-10-10 DIAGNOSIS — H90A21 Sensorineural hearing loss, unilateral, right ear, with restricted hearing on the contralateral side: Secondary | ICD-10-CM | POA: Diagnosis not present

## 2022-10-14 ENCOUNTER — Other Ambulatory Visit: Payer: Self-pay | Admitting: Family

## 2022-10-14 ENCOUNTER — Other Ambulatory Visit: Payer: Self-pay | Admitting: Gastroenterology

## 2022-10-16 ENCOUNTER — Encounter (HOSPITAL_COMMUNITY): Payer: Self-pay | Admitting: General Surgery

## 2022-10-16 DIAGNOSIS — K571 Diverticulosis of small intestine 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 or abscess without bleeding: Secondary | ICD-10-CM | POA: Diagnosis not present

## 2022-10-16 DIAGNOSIS — K2100 Gastro-esophageal reflux disease with esophagitis, without bleeding: Secondary | ICD-10-CM | POA: Diagnosis not present

## 2022-10-16 DIAGNOSIS — K315 Obstruction of duodenum: Secondary | ICD-10-CM | POA: Diagnosis not present

## 2022-10-16 DIAGNOSIS — K580 Irritable bowel syndrome with diarrhea: Secondary | ICD-10-CM | POA: Diagnosis not present

## 2022-10-16 DIAGNOSIS — K219 Gastro-esophageal reflux disease without esophagitis: Secondary | ICD-10-CM

## 2022-10-16 DIAGNOSIS — K449 Diaphragmatic hernia without obstruction or gangrene: Secondary | ICD-10-CM | POA: Diagnosis not present

## 2022-10-17 ENCOUNTER — Telehealth: Payer: Self-pay

## 2022-10-17 ENCOUNTER — Other Ambulatory Visit (HOSPITAL_COMMUNITY): Payer: Self-pay | Admitting: General Surgery

## 2022-10-17 ENCOUNTER — Other Ambulatory Visit: Payer: Self-pay

## 2022-10-17 DIAGNOSIS — K449 Diaphragmatic hernia without obstruction or gangrene: Secondary | ICD-10-CM

## 2022-10-17 DIAGNOSIS — K2100 Gastro-esophageal reflux disease with esophagitis, without bleeding: Secondary | ICD-10-CM

## 2022-10-17 DIAGNOSIS — K219 Gastro-esophageal reflux disease without esophagitis: Secondary | ICD-10-CM

## 2022-10-17 NOTE — Telephone Encounter (Signed)
-----  Message from [place], DO sent at 10/16/2022  2:23 PM EST ----- Can you please order Esophageal Manometry for this patient.  No need for pH/impedance testing.  Please let me know if this is going to be delayed several months and I will instead try to get this done over at [facility] through the surgical clinic.  Thanks.  ----- Message ----- From: Greer Pickerel, MD Sent: 10/16/2022  12:30 PM EST To: Lavena Bullion, DO  Saw her today  Told her I think she needs gastric emptying study and esophageal manometry.   We will order GES.  Needs esophageal manometry.  Can you arrange?  If significant time delay, we can send her to [facility].   One of the issues struggling with is her duodenal bulb stenosis. Dont know yet if she needs antrectomy with like roux en y reconstruction with HHR;    dont think a classic HHR with wrap is way to go based on info so far.   Thank w

## 2022-10-17 NOTE — Telephone Encounter (Signed)
Called [facility] and they said they are scheduling in May. Referral faxed to [facility] at [phone].

## 2022-10-17 NOTE — Telephone Encounter (Addendum)
The next available appointment was 03/12/23. Ambulatory referral placed. I called the pt to let her know. She requested [facility] if they had a sooner appointment because they are closer to her, but said that she would go to [facility] if they had the sooner available appointment.

## 2022-10-25 ENCOUNTER — Encounter (HOSPITAL_COMMUNITY): Admission: RE | Admit: 2022-10-25 | Payer: 59 | Source: Ambulatory Visit

## 2022-10-28 DIAGNOSIS — H0102A Squamous blepharitis right eye, upper and lower eyelids: Secondary | ICD-10-CM | POA: Diagnosis not present

## 2022-10-28 DIAGNOSIS — H0102B Squamous blepharitis left eye, upper and lower eyelids: Secondary | ICD-10-CM | POA: Diagnosis not present

## 2022-10-28 DIAGNOSIS — H353131 Nonexudative age-related macular degeneration, bilateral, early dry stage: Secondary | ICD-10-CM | POA: Diagnosis not present

## 2022-10-28 DIAGNOSIS — Z961 Presence of intraocular lens: Secondary | ICD-10-CM | POA: Diagnosis not present

## 2022-10-29 ENCOUNTER — Encounter (HOSPITAL_COMMUNITY)
Admission: RE | Admit: 2022-10-29 | Discharge: 2022-10-29 | Disposition: A | Discharge status: Home / Self Care | Payer: 59 | Source: Ambulatory Visit | Attending: General Surgery | Admitting: General Surgery

## 2022-10-29 DIAGNOSIS — K219 Gastro-esophageal reflux disease without esophagitis: Secondary | ICD-10-CM | POA: Diagnosis not present

## 2022-10-29 DIAGNOSIS — Z0389 Encounter for observation for other suspected diseases and conditions ruled out: Secondary | ICD-10-CM | POA: Diagnosis not present

## 2022-10-29 MED ORDER — TECHNETIUM TC 99M SULFUR COLLOID
2.0000 | Freq: Once | INTRAVENOUS | Status: AC | PRN
  Administered 2022-10-29: 2 via ORAL

## 2022-11-04 ENCOUNTER — Telehealth: Payer: Self-pay | Admitting: Family

## 2022-11-04 NOTE — Telephone Encounter (Signed)
Copied from [facility] [phone]. Topic: Medicare AWV >> Nov 04, 2022  1:02 PM Devoria Glassing wrote: Reason for CRM:  Left message for patient to call back and schedule Medicare Annual Wellness Visit (AWV).  Last date of AWV: 11/09/2021  Please schedule an appointment at any time with [facility].  If any questions, please contact me.  Thank you ,  [place] Direct Dial: [phone]

## 2022-11-06 ENCOUNTER — Telehealth: Payer: Self-pay | Admitting: Family

## 2022-11-06 NOTE — Telephone Encounter (Signed)
Contacted Lindsay Frost to schedule their annual wellness visit. Appointment made for 11/14/2022.  [facility] Direct Dial: [phone]

## 2022-11-14 ENCOUNTER — Ambulatory Visit (INDEPENDENT_AMBULATORY_CARE_PROVIDER_SITE_OTHER): Payer: 59 | Admitting: *Deleted

## 2022-11-14 DIAGNOSIS — Z0000 Encounter for general adult medical examination without abnormal findings: Secondary | ICD-10-CM | POA: Diagnosis not present

## 2022-11-14 NOTE — Progress Notes (Signed)
Subjective:  Pt completed ADLs, Fall risk, & SDOH during e-check in on 11/13/22.  Answers verified with pt.    Lindsay Frost is a 67 y.o. female who presents for Medicare Annual (Subsequent) preventive examination.  I connected with  Lindsay Frost on 11/14/22 by a audio enabled telemedicine application and verified that I am speaking with the correct person using two identifiers.  Patient Location: Home  Provider Location: Office/Clinic  I discussed the limitations of evaluation and management by telemedicine. The patient expressed understanding and agreed to proceed.   Review of Systems     Cardiac Risk Factors include: dyslipidemia;hypertension;advanced age (>36mn, >>61women)     Objective:    There were no vitals filed for this visit. There is no height or weight on file to calculate BMI.     11/14/2022    3:05 PM 11/13/2021    1:08 PM 11/09/2021   11:57 AM 04/18/2021   11:35 AM 11/13/2020    1:17 PM 08/24/2019    8:53 AM 08/16/2019   10:22 AM  Advanced Directives  Does Patient Have a Medical Advance Directive? No No No No No No No  Would patient like information on creating a medical advance directive? No - Patient declined  Yes (MAU/Ambulatory/Procedural Areas - Information given) No - Patient declined  No - Patient declined No - Patient declined    Current Medications (verified) Outpatient Encounter Medications as of 11/14/2022  Medication Sig   alendronate (FOSAMAX) 70 MG tablet Take 1 tablet (70 mg total) by mouth every 7 (seven) days. Take with a full glass of water on an empty stomach.   aluminum-magnesium hydroxide-simethicone (MAALOX) [id number]/5ML SUSP Take 30 mLs by mouth daily as needed for indigestion (indigestion).   amLODipine (NORVASC) 5 MG tablet TAKE 1 TABLET(5 MG) BY MOUTH DAILY   aspirin EC 81 MG tablet Take 1 tablet (81 mg total) by mouth daily. Swallow whole.   Aspirin-Salicylamide-Caffeine (BC HEADACHE PO) Take 1 packet by mouth daily as  needed for headache (headaches).   atorvastatin (LIPITOR) 20 MG tablet TAKE 1 TABLET(20 MG) BY MOUTH DAILY   azelastine (ASTELIN) 0.1 % nasal spray Place 2 sprays into both nostrils 2 (two) times daily.   Bioflavonoid Products (VITAMIN C) CHEW Chew 1 tablet by mouth daily.   cholestyramine (QUESTRAN) 4 g packet Take 1 packet (4 g total) by mouth 2 (two) times daily.   dicyclomine (BENTYL) 10 MG capsule Take 1 capsule (10 mg total) by mouth every 6 (six) hours as needed for spasms.   eletriptan (RELPAX) 40 MG tablet TAKE 1 TABLET BY MOUTH AS NEEDED FOR MIGRAINE, REPEAT IN 2 HOURS IF SYMPTOMS PERSIST   esomeprazole (NEXIUM) 40 MG capsule Take 1 capsule (40 mg total) by mouth 2 (two) times daily.   famotidine (PEPCID) 40 MG tablet Take 1 tablet (40 mg total) by mouth 2 (two) times daily.   fluticasone (FLONASE) 50 MCG/ACT nasal spray Place 2 sprays into both nostrils daily.   metoCLOPramide (REGLAN) 5 MG tablet TAKE 1 TABLET(5 MG) BY MOUTH THREE TIMES DAILY BEFORE MEALS AS NEEDED   MYRBETRIQ 25 MG TB24 tablet TAKE 1 TABLET(25 MG) BY MOUTH DAILY   nitroGLYCERIN (NITROSTAT) 0.4 MG SL tablet Place 0.4 mg under the tongue every 5 (five) minutes as needed for chest pain.   ondansetron (ZOFRAN) 4 MG tablet TAKE 1 TABLET(4 MG) BY MOUTH EVERY 8 HOURS AS NEEDED FOR NAUSEA OR VOMITING   rifaximin ([name]) 550 MG TABS tablet  Take 1 tablet (550 mg total) by mouth 3 (three) times daily.   sucralfate (CARAFATE) 1 g tablet Take 1 tablet (1 g total) by mouth 4 (four) times daily -  with meals and at bedtime.   topiramate (TOPAMAX) 100 MG tablet TAKE 1 TABLET(100 MG) BY MOUTH TWICE DAILY   Vitamin D, Ergocalciferol, (DRISDOL) 1.25 MG (50000 UNIT) CAPS capsule TAKE 1 CAPSULE BY MOUTH EVERY 7 DAYS   [DISCONTINUED] amoxicillin-clavulanate (AUGMENTIN) 875-125 MG tablet Take 1 tablet by mouth 2 (two) times daily.   No facility-administered encounter medications on file as of 11/14/2022.    Allergies  (verified) Naratriptan   History: Past Medical History:  Diagnosis Date   Acute midline thoracic back pain 02/16/2021   Acute sinusitis 09/05/2014   Allergic rhinitis 07/16/2016   Allergy 2005   Anemia 2021   Angina pectoris ([place]) 07/25/2020   Arthritis    B12 deficiency 01/02/2021   Back pain    arthritis   Chronic sinusitis 12/05/2020   Depression    takes cymbalta daily   Diarrhea 02/16/2021   Duodenal stenosis    Epigastric pain 07/29/2018   Essential hypertension 05/14/2019   Gallstones    Generalized abdominal pain 02/16/2021   GERD (gastroesophageal reflux disease)    takes Omeprazole daily   Headache    Heart murmur    Hiatal hernia    History of gastric ulcer    History of migraine    last one about 2wks ago-takes Imitrex prn   History of seizures    Joint pain    Joint swelling    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   Meningioma ([place])    Migraine without aura and without status migrainosus, not intractable 01/13/2015   Mixed dyslipidemia 07/25/2020   Myringotomy tube status 02/27/2021   Nausea 02/16/2021   Nocturnal leg cramps 02/20/2015   Obesity (BMI 30-39.9) 07/22/2014   Osteoarthritis of left knee 12/02/2012   Overactive bladder 01/02/2021   Peptic ulcer disease    Physical exam 07/22/2014   Recurrent acute serous otitis media of left ear 12/05/2020   Seizure-like activity ([place]) 05/12/2014   Past Surgical History:  Procedure Laterality Date   BALLOON DILATION  04/03/2012   Procedure: BALLOON DILATION;  Surgeon: Beryle Beams, MD;  Location: [facility];  Service: Endoscopy;  Laterality: N/A;   BIOPSY  08/24/2019   Procedure: BIOPSY;  Surgeon: Lavena Bullion, DO;  Location: [facility];  Service: Gastroenterology;;  EGD and COLON   BRAVO [place] STUDY N/A 08/24/2019   Procedure: BRAVO [place];  Surgeon: Lavena Bullion, DO;  [facility];  Service: Gastroenterology;  Laterality: N/A;    CHOLECYSTECTOMY N/A 04/24/2018   Procedure: LAPAROSCOPIC CHOLECYSTECTOMY;  Surgeon: Clovis Riley, MD;  [facility]: [facility];  Service: General;  Laterality: N/A;   COLONOSCOPY     COLONOSCOPY WITH PROPOFOL N/A 08/24/2019   Procedure: COLONOSCOPY WITH PROPOFOL;  Surgeon: Lavena Bullion, DO;  [facility];  Service: Gastroenterology;  Laterality: N/A;   DIAGNOSTIC LAPAROSCOPY  14yr ago   ESOPHAGEAL MANOMETRY N/A 09/04/2015   Procedure: ESOPHAGEAL MANOMETRY (EM);  Surgeon: PCarol Ada MD;  [facility];  Service: Endoscopy;  Laterality: N/A;   ESOPHAGOGASTRODUODENOSCOPY  04/03/2012   Procedure: ESOPHAGOGASTRODUODENOSCOPY (EGD);  Surgeon: PBeryle Beams MD;  [facility];  Service: Endoscopy;  Laterality: N/A;  EGD w/ balloon dilation   ESOPHAGOGASTRODUODENOSCOPY (EGD) WITH PROPOFOL N/A 02/18/2014   Procedure: ESOPHAGOGASTRODUODENOSCOPY (EGD) WITH PROPOFOL;  Surgeon: Beryle Beams, MD;  [facility];  Service: Endoscopy;  Laterality: N/A;   ESOPHAGOGASTRODUODENOSCOPY (EGD) WITH PROPOFOL N/A 04/22/2014   Procedure: ESOPHAGOGASTRODUODENOSCOPY (EGD) WITH PROPOFOL;  Surgeon: Beryle Beams, MD;  [facility];  Service: Endoscopy;  Laterality: N/A;   ESOPHAGOGASTRODUODENOSCOPY (EGD) WITH PROPOFOL N/A 08/24/2019   Procedure: ESOPHAGOGASTRODUODENOSCOPY (EGD) WITH PROPOFOL;  Surgeon: Lavena Bullion, DO;  [facility];  Service: Gastroenterology;  Laterality: N/A;   LEFT HEART CATH AND CORONARY ANGIOGRAPHY N/A 04/18/2021   Procedure: LEFT HEART CATH AND CORONARY ANGIOGRAPHY;  Surgeon: Lorretta Harp, MD;  Location: [facility];  Service: Cardiovascular;  Laterality: N/A;   NASAL SEPTUM SURGERY  09/2016   [place] IMPEDANCE STUDY N/A 09/04/2015   Procedure: [place] IMPEDANCE STUDY;  Surgeon: Carol Ada, MD;  [facility];  Service: Endoscopy;  Laterality: N/A;   REPLACEMENT TOTAL KNEE Right 30yr ago   TOTAL KNEE ARTHROPLASTY Left  11/30/2012   Procedure: LEFT TOTAL KNEE ARTHROPLASTY;  Surgeon: FKerin Salen MD;  [facility]  Service: Orthopedics;  Laterality: Left;   Family History  Problem Relation Age of Onset   Arthritis Mother    Cancer Mother        breast   Heart disease Mother        died from "heart problems" at age 67 had CAD   Depression Mother    Heart disease Father        had CABG   Kidney disease Father    Arthritis Sister    Heart murmur Sister    Arthritis Sister    Aneurysm Sister    Arthritis Brother        "serious back/neck problems"   Healthy Daughter    Colon cancer Neg Hx    Esophageal cancer Neg Hx    Pancreatic cancer Neg Hx    Stomach cancer Neg Hx    Social History   Socioeconomic History   Marital status: Widowed    Spouse name: Not on file   Number of children: 1   Years of education: Not on file   Highest education level: Some college, no degree  Occupational History   Occupation: Retired  Tobacco Use   Smoking status: Never   Smokeless tobacco: Never  Vaping Use   Vaping Use: Never used  Substance and Sexual Activity   Alcohol use: No   Drug use: No   Sexual activity: Not Currently    Birth control/protection: Post-menopausal  Other Topics Concern   Not on file  Social History Narrative   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, mining  Single   1 [place]- [place]  2 dogs   Enjoys reading, watching television   Pt is right handed   Drinks coffee once a week, green tea daily, soda sometimes    Social Determinants of Health   Financial Resource Strain: Medium Risk (11/13/2022)   Overall Financial Resource Strain (CARDIA)    Difficulty of Paying Living Expenses: Somewhat hard  Food Insecurity: No Food Insecurity (11/13/2022)   Hunger Vital Sign    Worried About Running Out of Food in the Last Year: Never true    [place] the Last Year: Never true  Transportation Needs: No Transportation Needs (11/13/2022)    PRAPARE - Transportation    Lack of Transportation (Medical): No    Lack of Transportation (Non-Medical): No  Physical Activity: Inactive (11/13/2022)  Exercise Vital Sign    Days of Exercise per Week: 0 days    Minutes of Exercise per Session: 0 min  Stress: Stress Concern Present (11/13/2022)   [place]    Feeling of Stress : To some extent  Social Connections: Unknown (11/13/2022)   Social Connection and Isolation Panel [NHANES]    Frequency of Communication with Friends and Family: Never    Frequency of Social Gatherings with Friends and Family: Patient refused    Attends Religious Services: Patient refused    Marine scientist or Organizations: No    Attends Archivist Meetings: Never    Marital Status: Widowed    Tobacco Counseling Counseling given: Not Answered   Clinical Intake:  Pre-visit preparation completed: Yes  Pain : No/denies pain  Diabetes: No  How often do you need to have someone help you when you read instructions, pamphlets, or other written materials from your doctor or pharmacy?: 3 - Sometimes  Activities of Daily Living    11/13/2022    1:58 PM  In your present state of health, do you have any difficulty performing the following activities:  Hearing? 0  Vision? 1  Comment macular degeneration  Difficulty concentrating or making decisions? 0  Walking or climbing stairs? 1  Dressing or bathing? 0  Doing errands, shopping? 1  Preparing Food and eating ? N  Using the Toilet? N  In the past six months, have you accidently leaked urine? Y  Do you have problems with loss of bowel control? N  Managing your Medications? N  Managing your Finances? N  Housekeeping or managing your Housekeeping? Y    Patient Care Team: Debbrah Alar, NP as PCP - General (Internal Medicine) Frederik Pear, MD as Consulting Physician (Orthopedic Surgery) Carol Ada, MD as  Consulting Physician (Gastroenterology) Pieter Partridge, DO as Consulting Physician (Neurology)  Indicate any recent Medical Services you may have received from other than [facility] providers in the past year (date may be approximate).     Assessment:   This is a routine wellness examination for [place].  Hearing/Vision screen No results found.  Dietary issues and exercise activities discussed: Current Exercise Habits: The patient does not participate in regular exercise at present, Exercise limited by: None identified   Goals Addressed   None    Depression Screen    11/14/2022    3:09 PM 11/09/2021   12:01 PM 01/30/2021    2:24 PM 07/04/2020    3:50 PM 09/30/2017   11:52 AM 07/23/2017    1:24 PM 01/02/2016    2:18 PM  PHQ 2/9 Scores  PHQ - 2 Score 2 0 0 '2 2 2 2  '$ PHQ- 9 Score    '11 11 15 2  '$ Exception Documentation       Medical reason    Fall Risk    11/13/2022    1:58 PM 11/13/2021    1:08 PM 11/09/2021   11:59 AM 01/30/2021    2:24 PM 11/13/2020    1:17 PM  [place] in the past year? 0 0 0 0 0  Number falls in past yr: 0 0 0 0 0  Injury with Fall? 0 0 0 0 0  Risk for fall due to : No Fall Risks      Follow up Falls evaluation completed  Falls prevention discussed      FALL RISK PREVENTION PERTAINING TO THE HOME:  Any  stairs in or around the home? Yes  If so, are there any without handrails? Yes  Home free of loose throw rugs in walkways, pet beds, electrical cords, etc? Yes  Adequate lighting in your home to reduce risk of falls? Yes   ASSISTIVE DEVICES UTILIZED TO PREVENT FALLS:  Life alert? No  Use of a cane, walker or w/c? No  Grab bars in the bathroom?  One in the shower Shower chair or bench in shower? Yes  Elevated toilet seat or a handicapped toilet? Yes   TIMED UP AND GO:  Was the test performed?  No, audio visit .   Cognitive Function:    01/15/2016    2:00 PM  MMSE - Mini Mental State Exam  Orientation to time 5  Orientation to Place 5   Registration 3  Attention/ Calculation 5  Recall 3  Language- name 2 objects 2  Language- repeat 1  Language- follow 3 step command 3  Language- read & follow direction 1  Write a sentence 1  Copy design 0  Total score 29        11/14/2022    3:16 PM  6CIT Screen  What Year? 0 points  What month? 0 points  What time? 0 points  Count back from 20 0 points  Months in reverse 0 points  Repeat phrase 0 points  Total Score 0 points    Immunizations Immunization History  Administered Date(s) Administered   Fluad Quad(high Dose 65+) 11/09/2021   Influenza,inj,Quad PF,6+ Mos 06/17/2016, 05/17/2017, 06/26/2018, 07/04/2020   Tdap 08/05/2013    TDAP status: Up to date  Flu Vaccine status: Due, Education has been provided regarding the importance of this vaccine. Advised may receive this vaccine at local pharmacy or Health Dept. Aware to provide a copy of the vaccination record if obtained from local pharmacy or Health Dept. Verbalized acceptance and understanding.  Pneumococcal vaccine status: Due, Education has been provided regarding the importance of this vaccine. Advised may receive this vaccine at local pharmacy or Health Dept. Aware to provide a copy of the vaccination record if obtained from local pharmacy or Health Dept. Verbalized acceptance and understanding.  Covid-19 vaccine status: Information provided on how to obtain vaccines.   Qualifies for Shingles Vaccine? Yes   Zostavax completed No   Shingrix Completed?: No.    Education has been provided regarding the importance of this vaccine. Patient has been advised to call insurance company to determine out of pocket expense if they have not yet received this vaccine. Advised may also receive vaccine at local pharmacy or Health Dept. Verbalized acceptance and understanding.  Screening Tests Health Maintenance  Topic Date Due   COVID-19 Vaccine (1) Never done   Zoster Vaccines- Shingrix (1 of 2) Never done   Pneumonia  Vaccine 4+ Years old (1 of 1 - PCV) Never done   COLONOSCOPY (Pts 45-110yr Insurance coverage will need to be confirmed)  08/23/2022   Medicare Annual Wellness (AWV)  11/09/2022   INFLUENZA VACCINE  12/15/2022 (Originally 04/16/2022)   MAMMOGRAM  01/12/2023   DTaP/Tdap/Td (2 - Td or Tdap) 08/06/2023   DEXA SCAN  Completed   Hepatitis C Screening  Completed   HPV VACCINES  Aged Out    Health Maintenance  Health Maintenance Due  Topic Date Due   COVID-19 Vaccine (1) Never done   Zoster Vaccines- Shingrix (1 of 2) Never done   Pneumonia Vaccine 67 Years old (1 of 1 - PCV) Never done  COLONOSCOPY (Pts 45-34yr Insurance coverage will need to be confirmed)  08/23/2022   Medicare Annual Wellness (AWV)  11/09/2022    Colorectal cancer screening: Type of screening: Colonoscopy. Completed 08/24/19. Repeat every 3 years. pt will call to schedule  Mammogram status: Completed 01/11/22. Repeat every year  Bone Density status: Completed 07/11/20. Results reflect: Bone density results: OSTEOPOROSIS. Repeat every 2 years.  Lung Cancer Screening: (Low Dose CT Chest recommended if Age 67-80years, 30 pack-year currently smoking OR have quit w/in 15years.) does not qualify.  Additional Screening:  Hepatitis C Screening: does qualify; Completed 06/17/16  Vision Screening: Recommended annual ophthalmology exams for early detection of glaucoma and other disorders of the eye. Is the patient up to date with their annual eye exam?  Yes  Who is the provider or what is the name of the office in which the patient attends annual eye exams? [facility]. If pt is not established with a provider, would they like to be referred to a provider to establish care? No .   Dental Screening: Recommended annual dental exams for proper oral hygiene  Community Resource Referral / Chronic Care Management: CRR required this visit?  No   CCM required this visit?  No      Plan:     I have personally reviewed  and noted the following in the patient's chart:   Medical and social history Use of alcohol, tobacco or illicit drugs  Current medications and supplements including opioid prescriptions. Patient is not currently taking opioid prescriptions. Functional ability and status Nutritional status Physical activity Advanced directives List of other physicians Hospitalizations, surgeries, and ER visits in previous 12 months Vitals Screenings to include cognitive, depression, and falls Referrals and appointments  In addition, I have reviewed and discussed with patient certain preventive protocols, quality metrics, and best practice recommendations. A written personalized care plan for preventive services as well as general preventive health recommendations were provided to patient.   Due to this being a telephonic visit, the after visit summary with patients personalized plan was offered to patient via mail or my-chart. Patient would like to access on my-chart.   BBeatris Ship [place]  11/14/2022   Nurse Notes: None

## 2022-11-14 NOTE — Patient Instructions (Signed)
Lindsay Frost , Thank you for taking time to come for your Medicare Wellness Visit. I appreciate your ongoing commitment to your health goals. Please review the following plan we discussed and let me know if I can assist you in the future.     This is a list of the screening recommended for you and due dates:  Health Maintenance  Topic Date Due   COVID-19 Vaccine (1) Never done   Zoster (Shingles) Vaccine (1 of 2) Never done   Pneumonia Vaccine (1 of 1 - PCV) Never done   Colon Cancer Screening  08/23/2022   Flu Shot  12/15/2022*   Mammogram  01/12/2023   DTaP/Tdap/Td vaccine (2 - Td or Tdap) 08/06/2023   Medicare Annual Wellness Visit  11/14/2023   DEXA scan (bone density measurement)  Completed   Hepatitis C Screening: USPSTF Recommendation to screen - Ages 31-79 yo.  Completed   HPV Vaccine  Aged Out  *Topic was postponed. The date shown is not the original due date.     Next appointment: Follow up in one year for your annual wellness visit.   Preventive Care 58 Years and Older, Female Preventive care refers to lifestyle choices and visits with your health care provider that can promote health and wellness. What does preventive care include? A yearly physical exam. This is also called an annual well check. Dental exams once or twice a year. Routine eye exams. Ask your health care provider how often you should have your eyes checked. Personal lifestyle choices, including: Daily care of your teeth and gums. Regular physical activity. Eating a healthy diet. Avoiding tobacco and drug use. Limiting alcohol use. Practicing safe sex. Taking low-dose aspirin every day. Taking vitamin and mineral supplements as recommended by your health care provider. What happens during an annual well check? The services and screenings done by your health care provider during your annual well check will depend on your age, overall health, lifestyle risk factors, and family history of  disease. Counseling  Your health care provider may ask you questions about your: Alcohol use. Tobacco use. Drug use. Emotional well-being. Home and relationship well-being. Sexual activity. Eating habits. History of falls. Memory and ability to understand (cognition). Work and work Statistician. Reproductive health. Screening  You may have the following tests or measurements: Height, weight, and BMI. Blood pressure. Lipid and cholesterol levels. These may be checked every 5 years, or more frequently if you are over 42 years old. Skin check. Lung cancer screening. You may have this screening every year starting at age 81 if you have a 30-pack-year history of smoking and currently smoke or have quit within the past 15 years. Fecal occult blood test (FOBT) of the stool. You may have this test every year starting at age 70. Flexible sigmoidoscopy or colonoscopy. You may have a sigmoidoscopy every 5 years or a colonoscopy every 10 years starting at age 51. Hepatitis C blood test. Hepatitis B blood test. Sexually transmitted disease (STD) testing. Diabetes screening. This is done by checking your blood sugar (glucose) after you have not eaten for a while (fasting). You may have this done every 1-3 years. Bone density scan. This is done to screen for osteoporosis. You may have this done starting at age 56. Mammogram. This may be done every 1-2 years. Talk to your health care provider about how often you should have regular mammograms. Talk with your health care provider about your test results, treatment options, and if necessary, the need for more  tests. Vaccines  Your health care provider may recommend certain vaccines, such as: Influenza vaccine. This is recommended every year. Tetanus, diphtheria, and acellular pertussis (Tdap, Td) vaccine. You may need a Td booster every 10 years. Zoster vaccine. You may need this after age 52. Pneumococcal 13-valent conjugate (PCV13) vaccine. One  dose is recommended after age 41. Pneumococcal polysaccharide (PPSV23) vaccine. One dose is recommended after age 63. Talk to your health care provider about which screenings and vaccines you need and how often you need them. This information is not intended to replace advice given to you by your health care provider. Make sure you discuss any questions you have with your health care provider. Document Released: 09/29/2015 Document Revised: 05/22/2016 Document Reviewed: 07/04/2015 Elsevier Interactive Patient Education  2017 [place] Prevention in the Home Falls can cause injuries. They can happen to people of all ages. There are many things you can do to make your home safe and to help prevent falls. What can I do on the outside of my home? Regularly fix the edges of walkways and driveways and fix any cracks. Remove anything that might make you trip as you walk through a door, such as a raised step or threshold. Trim any bushes or trees on the path to your home. Use bright outdoor lighting. Clear any walking paths of anything that might make someone trip, such as rocks or tools. Regularly check to see if handrails are loose or broken. Make sure that both sides of any steps have handrails. Any raised decks and porches should have guardrails on the edges. Have any leaves, snow, or ice cleared regularly. Use sand or salt on walking paths during winter. Clean up any spills in your garage right away. This includes oil or grease spills. What can I do in the bathroom? Use night lights. Install grab bars by the toilet and in the tub and shower. Do not use towel bars as grab bars. Use non-skid mats or decals in the tub or shower. If you need to sit down in the shower, use a plastic, non-slip stool. Keep the floor dry. Clean up any water that spills on the floor as soon as it happens. Remove soap buildup in the tub or shower regularly. Attach bath mats securely with double-sided  non-slip rug tape. Do not have throw rugs and other things on the floor that can make you trip. What can I do in the bedroom? Use night lights. Make sure that you have a light by your bed that is easy to reach. Do not use any sheets or blankets that are too big for your bed. They should not hang down onto the floor. Have a firm chair that has side arms. You can use this for support while you get dressed. Do not have throw rugs and other things on the floor that can make you trip. What can I do in the kitchen? Clean up any spills right away. Avoid walking on wet floors. Keep items that you use a lot in easy-to-reach places. If you need to reach something above you, use a strong step stool that has a grab bar. Keep electrical cords out of the way. Do not use floor polish or wax that makes floors slippery. If you must use wax, use non-skid floor wax. Do not have throw rugs and other things on the floor that can make you trip. What can I do with my stairs? Do not leave any items on the stairs. Make sure  that there are handrails on both sides of the stairs and use them. Fix handrails that are broken or loose. Make sure that handrails are as long as the stairways. Check any carpeting to make sure that it is firmly attached to the stairs. Fix any carpet that is loose or worn. Avoid having throw rugs at the top or bottom of the stairs. If you do have throw rugs, attach them to the floor with carpet tape. Make sure that you have a light switch at the top of the stairs and the bottom of the stairs. If you do not have them, ask someone to add them for you. What else can I do to help prevent falls? Wear shoes that: Do not have high heels. Have rubber bottoms. Are comfortable and fit you well. Are closed at the toe. Do not wear sandals. If you use a stepladder: Make sure that it is fully opened. Do not climb a closed stepladder. Make sure that both sides of the stepladder are locked into place. Ask  someone to hold it for you, if possible. Clearly mark and make sure that you can see: Any grab bars or handrails. First and last steps. Where the edge of each step is. Use tools that help you move around (mobility aids) if they are needed. These include: Canes. Walkers. Scooters. Crutches. Turn on the lights when you go into a dark area. Replace any light bulbs as soon as they burn out. Set up your furniture so you have a clear path. Avoid moving your furniture around. If any of your floors are uneven, fix them. If there are any pets around you, be aware of where they are. Review your medicines with your doctor. Some medicines can make you feel dizzy. This can increase your chance of falling. Ask your doctor what other things that you can do to help prevent falls. This information is not intended to replace advice given to you by your health care provider. Make sure you discuss any questions you have with your health care provider. Document Released: 06/29/2009 Document Revised: 02/08/2016 Document Reviewed: 10/07/2014 Elsevier Interactive Patient Education  2017 [employer].

## 2022-11-16 ENCOUNTER — Other Ambulatory Visit: Payer: Self-pay | Admitting: Gastroenterology

## 2022-11-25 ENCOUNTER — Other Ambulatory Visit: Payer: Self-pay | Admitting: Neurology

## 2022-11-25 NOTE — Telephone Encounter (Signed)
Patient last seen 10/2021,, Tried calling to see if she could schedule a f/U. No answer. LMOVM for patient to call the office to schedule.

## 2022-11-26 DIAGNOSIS — H903 Sensorineural hearing loss, bilateral: Secondary | ICD-10-CM | POA: Diagnosis not present

## 2022-11-28 ENCOUNTER — Encounter: Payer: Self-pay | Admitting: Gastroenterology

## 2022-11-28 MED ORDER — DICYCLOMINE HCL 10 MG PO CAPS: 10.0000 mg | ORAL_CAPSULE | Freq: Four times a day (QID) | ORAL | 3 refills | Status: DC | PRN

## 2022-12-05 ENCOUNTER — Other Ambulatory Visit: Payer: Self-pay | Admitting: Neurology

## 2022-12-06 ENCOUNTER — Other Ambulatory Visit: Payer: Self-pay | Admitting: Gastroenterology

## 2022-12-22 ENCOUNTER — Other Ambulatory Visit: Payer: Self-pay | Admitting: Family Medicine

## 2022-12-22 DIAGNOSIS — J0140 Acute pansinusitis, unspecified: Secondary | ICD-10-CM

## 2022-12-23 ENCOUNTER — Other Ambulatory Visit: Payer: Self-pay | Admitting: Gastroenterology

## 2022-12-25 ENCOUNTER — Encounter (HOSPITAL_COMMUNITY)
Admission: RE | Disposition: A | Discharge status: Home / Self Care | Payer: Self-pay | Source: Home / Self Care | Attending: Gastroenterology

## 2022-12-25 ENCOUNTER — Ambulatory Visit (HOSPITAL_COMMUNITY)
Admission: RE | Admit: 2022-12-25 | Discharge: 2022-12-25 | Disposition: A | Discharge status: Home / Self Care | Payer: 59 | Attending: Gastroenterology | Admitting: Gastroenterology

## 2022-12-25 DIAGNOSIS — R1310 Dysphagia, unspecified: Secondary | ICD-10-CM

## 2022-12-25 DIAGNOSIS — R12 Heartburn: Secondary | ICD-10-CM | POA: Diagnosis not present

## 2022-12-25 DIAGNOSIS — K219 Gastro-esophageal reflux disease without esophagitis: Secondary | ICD-10-CM | POA: Diagnosis not present

## 2022-12-25 HISTORY — PX: ESOPHAGEAL MANOMETRY: SHX5429

## 2022-12-25 SURGERY — MANOMETRY, ESOPHAGUS
Anesthesia: Choice

## 2022-12-25 MED ORDER — LIDOCAINE VISCOUS HCL 2 % MT SOLN
OROMUCOSAL | Status: AC
  Filled 2022-12-25: qty 15

## 2022-12-25 SURGICAL SUPPLY — 2 items
FACESHIELD LNG OPTICON STERILE (SAFETY) IMPLANT
GLOVE BIO SURGEON STRL SZ8 (GLOVE) ×2 IMPLANT

## 2022-12-25 NOTE — Progress Notes (Signed)
Esophageal Manometry done per protocol. Patient tolerated well without distress or complication.   

## 2022-12-29 ENCOUNTER — Encounter (HOSPITAL_COMMUNITY): Payer: Self-pay | Admitting: Gastroenterology

## 2022-12-30 ENCOUNTER — Ambulatory Visit (AMBULATORY_SURGERY_CENTER): Payer: 59

## 2022-12-30 VITALS — Ht 64.0 in | Wt 100.0 lb

## 2022-12-30 DIAGNOSIS — K267 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 or perforation: Secondary | ICD-10-CM | POA: Insufficient documentation

## 2022-12-30 DIAGNOSIS — K8020 Calculus of gallbladder without cholecystitis without obstruction: Secondary | ICD-10-CM | POA: Insufficient documentation

## 2022-12-30 DIAGNOSIS — K5900 Constipation, unspecified: Secondary | ICD-10-CM | POA: Insufficient documentation

## 2022-12-30 DIAGNOSIS — R0781 Pleurodynia: Secondary | ICD-10-CM | POA: Insufficient documentation

## 2022-12-30 DIAGNOSIS — Z1211 Encounter for screening for malignant neoplasm of colon: Secondary | ICD-10-CM

## 2022-12-30 DIAGNOSIS — I2510 Atherosclerotic heart disease of native coronary artery without angina pectoris: Secondary | ICD-10-CM | POA: Insufficient documentation

## 2022-12-30 DIAGNOSIS — R194 Change in bowel habit: Secondary | ICD-10-CM | POA: Insufficient documentation

## 2022-12-30 MED ORDER — NA SULFATE-K SULFATE-MG SULF 17.5-3.13-1.6 GM/177ML PO SOLN
1.0000 | Freq: Once | ORAL | 0 refills | Status: AC
Start: 2022-12-30 — End: 2022-12-30

## 2022-12-30 NOTE — Progress Notes (Signed)
No egg or soy allergy known to patient  No issues known to pt with past sedation with any surgeries or procedures Patient denies ever being told they had issues or difficulty with intubation  No FH of Malignant Hyperthermia Pt is not on diet pills Pt is not on  home 02  Pt is not on blood thinners  Pt with occ constipation recently  No A fib or A flutter Have any cardiac testing pending--no  Pt instructed to use Singlecare.com or GoodRx for a price reduction on prep   Patient's chart reviewed by Cathlyn Parsons CNRA prior to previsit and patient appropriate for the [facility].  Previsit completed and red dot placed by patient's name on their procedure day (on provider's schedule).

## 2023-01-09 ENCOUNTER — Other Ambulatory Visit: Payer: Self-pay | Admitting: Family

## 2023-01-14 DIAGNOSIS — R1310 Dysphagia, unspecified: Secondary | ICD-10-CM

## 2023-01-15 DIAGNOSIS — R634 Abnormal weight loss: Secondary | ICD-10-CM | POA: Diagnosis not present

## 2023-01-16 ENCOUNTER — Other Ambulatory Visit: Payer: Self-pay | Admitting: Family

## 2023-01-22 ENCOUNTER — Encounter: Payer: Self-pay | Admitting: Certified Registered Nurse Anesthetist

## 2023-01-27 ENCOUNTER — Encounter: Payer: Self-pay | Admitting: Gastroenterology

## 2023-01-27 ENCOUNTER — Ambulatory Visit (AMBULATORY_SURGERY_CENTER): Payer: 59 | Admitting: Gastroenterology

## 2023-01-27 VITALS — BP 152/78 | HR 76 | Temp 98.7°F | Resp 15 | Ht 64.0 in | Wt 100.0 lb

## 2023-01-27 DIAGNOSIS — K573 Diverticulosis of large intestine 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 or abscess without bleeding: Secondary | ICD-10-CM

## 2023-01-27 DIAGNOSIS — D122 Benign neoplasm of ascending colon: Secondary | ICD-10-CM

## 2023-01-27 DIAGNOSIS — Z1211 Encounter for screening for malignant neoplasm of colon: Secondary | ICD-10-CM

## 2023-01-27 DIAGNOSIS — F32A Depression, unspecified: Secondary | ICD-10-CM | POA: Diagnosis not present

## 2023-01-27 DIAGNOSIS — K641 Second degree hemorrhoids: Secondary | ICD-10-CM

## 2023-01-27 DIAGNOSIS — K2100 Gastro-esophageal reflux disease with esophagitis, without bleeding: Secondary | ICD-10-CM

## 2023-01-27 MED ORDER — SODIUM CHLORIDE 0.9 % IV SOLN: 500.0000 mL | Freq: Once | INTRAVENOUS | Status: DC

## 2023-01-27 NOTE — Progress Notes (Signed)
1340 Patient experiencing nausea and retching.  MD updated and Zofran 4 mg IV given, vss

## 2023-01-27 NOTE — Op Note (Signed)
[facility] Patient Name: Lindsay Frost Procedure Date: 01/27/2023 1:32 PM MRN: [id number] Endoscopist: Doristine Locks , MD, [id number] Age: 67 Referring MD:  Date of Birth: 11-10-1955 Gender: Female Account #: [id number] Procedure:                Colonoscopy Indications:              Screening for colorectal malignant neoplasm                           Last colonoscopy was 08/2019 and notable for                            sigmoid diverticulosis, moderate stool with                            otherwise normal colon mucosa with biopsies                            negative for MC. Recommended repeat in 3 years due                            to suboptimal bowel preparation. Medicines:                Monitored Anesthesia Care Procedure:                Pre-Anesthesia Assessment:                           - Prior to the procedure, a History and Physical                            was performed, and patient medications and                            allergies were reviewed. The patient's tolerance of                            previous anesthesia was also reviewed. The risks                            and benefits of the procedure and the sedation                            options and risks were discussed with the patient.                            All questions were answered, and informed consent                            was obtained. Prior Anticoagulants: The patient has                            taken no anticoagulant or antiplatelet agents. ASA  Grade Assessment: III - A patient with severe                            systemic disease. After reviewing the risks and                            benefits, the patient was deemed in satisfactory                            condition to undergo the procedure.                           After obtaining informed consent, the colonoscope                            was passed under direct vision. Throughout  the                            procedure, the patient's blood pressure, pulse, and                            oxygen saturations were monitored continuously. The                            Olympus PCF-H190DL [id number]) Colonoscope was                            introduced through the anus and advanced to the the                            cecum, identified by the appendiceal orifice,                            ileocecal valve and palpation. The colonoscopy was                            performed without difficulty. The patient tolerated                            the procedure well. Following copious lavage, the                            quality of the bowel preparation was adequate. The                            ileocecal valve, appendiceal orifice, and rectum                            were photographed. Of note, the patient did                            regurgitate partially digested food near the end of  the procedure. She did not aspirate any                            food/material, and maintained stable hemodynamics                            throughout the procedure. That food regurgitation                            is consistent with her known history of                            gastroparesesis. Scope In: 1:35:16 PM Scope Out: 1:50:37 PM Scope Withdrawal Time: 0 hours 10 minutes 5 seconds  Total Procedure Duration: 0 hours 15 minutes 21 seconds  Findings:                 The perianal and digital rectal examinations were                            normal.                           A 2 mm polyp was found in the ascending colon. The                            polyp was sessile. The polyp was removed with a                            cold snare. Resection and retrieval were complete.                            Estimated blood loss was minimal.                           A few small-mouthed diverticula were found in the                             sigmoid colon.                           Non-bleeding internal hemorrhoids were found during                            retroflexion. The hemorrhoids were small and Grade                            II (internal hemorrhoids that prolapse but reduce                            spontaneously).                           A small amount of stool was found in the entire  colon. Lavage of the area was performed using                            copious amounts of tap water, resulting in                            clearance with adequate visualization. Complications:            No immediate complications. Estimated Blood Loss:     Estimated blood loss was minimal. Impression:               - One 2 mm polyp in the ascending colon, removed                            with a cold snare. Resected and retrieved.                           - Diverticulosis in the sigmoid colon.                           - Non-bleeding internal hemorrhoids.                           - Stool in the entire examined colon.                           - The GI Genius (intelligent endoscopy module),                            computer-aided polyp detection system powered by AI                            was utilized to detect colorectal polyps through                            enhanced visualization during colonoscopy. Recommendation:           - Patient has a contact number available for                            emergencies. The signs and symptoms of potential                            delayed complications were discussed with the                            patient. Return to normal activities tomorrow.                            Written discharge instructions were provided to the                            patient.                           - Resume previous diet.                           -  Continue present medications.                           - Await pathology results.                            - Repeat colonoscopy for surveillance based on                            pathology results.                           - Return to GI office PRN. Doristine Locks, MD 01/27/2023 1:56:42 PM

## 2023-01-27 NOTE — Progress Notes (Signed)
1348 Pt drowsy and spit out food material, no more sedation given, no signs or symptoms of aspiration noted, vss MD made aware.

## 2023-01-27 NOTE — Patient Instructions (Signed)
Impression/Recommendations:  Polyp, diverticulosis, and hemorrhoid handouts given to patient.  Resume previous diet. Continue present medications. Await pathology results.  Repeat colonoscopy for surveillance.  Date to be determined based on pathology results.  Return to GI office as needed.  YOU HAD AN ENDOSCOPIC PROCEDURE TODAY AT THE [facility]:   Refer to the procedure report that was given to you for any specific questions about what was found during the examination.  If the procedure report does not answer your questions, please call your gastroenterologist to clarify.  If you requested that your care partner not be given the details of your procedure findings, then the procedure report has been included in a sealed envelope for you to review at your convenience later.  YOU SHOULD EXPECT: Some feelings of bloating in the abdomen. Passage of more gas than usual.  Walking can help get rid of the air that was put into your GI tract during the procedure and reduce the bloating. If you had a lower endoscopy (such as a colonoscopy or flexible sigmoidoscopy) you may notice spotting of blood in your stool or on the toilet paper. If you underwent a bowel prep for your procedure, you may not have a normal bowel movement for a few days.  Please Note:  You might notice some irritation and congestion in your nose or some drainage.  This is from the oxygen used during your procedure.  There is no need for concern and it should clear up in a day or so.  SYMPTOMS TO REPORT IMMEDIATELY:  Following lower endoscopy (colonoscopy or flexible sigmoidoscopy):  Excessive amounts of blood in the stool  Significant tenderness or worsening of abdominal pains  Swelling of the abdomen that is new, acute  Fever of 100F or higher  For urgent or emergent issues, a gastroenterologist can be reached at any hour by calling [phone]. Do not use MyChart messaging for urgent concerns.    DIET:  We  do recommend a small meal at first, but then you may proceed to your regular diet.  Drink plenty of fluids but you should avoid alcoholic beverages for 24 hours.  ACTIVITY:  You should plan to take it easy for the rest of today and you should NOT DRIVE or use heavy machinery until tomorrow (because of the sedation medicines used during the test).    FOLLOW UP: Our staff will call the number listed on your records the next business day following your procedure.  We will call around 7:15- 8:00 am to check on you and address any questions or concerns that you may have regarding the information given to you following your procedure. If we do not reach you, we will leave a message.     If any biopsies were taken you will be contacted by phone or by letter within the next 1-3 weeks.  Please call us at [phone] if you have not heard about the biopsies in 3 weeks.    SIGNATURES/CONFIDENTIALITY: You and/or your care partner have signed paperwork which will be entered into your electronic medical record.  These signatures attest to the fact that that the information above on your After Visit Summary has been reviewed and is understood.  Full responsibility of the confidentiality of this discharge information lies with you and/or your care-partner.

## 2023-01-27 NOTE — Progress Notes (Signed)
Called to room to assist during endoscopic procedure.  Patient ID and intended procedure confirmed with present staff. Received instructions for my participation in the procedure from the performing physician.  

## 2023-01-27 NOTE — Progress Notes (Signed)
Report given to PACU, vss 

## 2023-01-27 NOTE — Progress Notes (Signed)
VS completed by EC.   Pt's states no medical or surgical changes since previsit or office visit.  

## 2023-01-27 NOTE — Progress Notes (Signed)
GASTROENTEROLOGY PROCEDURE H&P NOTE   Primary Care Physician: Sandford Craze, NP    Reason for Procedure:  Colon Cancer screening  Plan:    Colonoscopy  Patient is appropriate for endoscopic procedure(s) in the ambulatory ([facility]) setting.  The nature of the procedure, as well as the risks, benefits, and alternatives were carefully and thoroughly reviewed with the patient. Ample time for discussion and questions allowed. The patient understood, was satisfied, and agreed to proceed.     HPI: Lindsay Frost is a 67 y.o. female who presents for colonoscopy for routine Colon Cancer screening.  No recent lower GI symptoms.  Last colonoscopy was 08/2019 and notable for sigmoid diverticulosis, moderate stool with otherwise normal colon mucosa with biopsies negative for MC.  Recommended repeat in 3 years due to suboptimal bowel preparation.  Past Medical History:  Diagnosis Date   Acute midline thoracic back pain 02/16/2021   Acute sinusitis 09/05/2014   Allergic rhinitis 07/16/2016   Allergy 2005   Anemia 2021   Angina pectoris (HCC) 07/25/2020   Arthritis    B12 deficiency 01/02/2021   Back pain    arthritis   Chronic sinusitis 12/05/2020   Depression    takes cymbalta daily   Diarrhea 02/16/2021   Duodenal stenosis    Epigastric pain 07/29/2018   Essential hypertension 05/14/2019   Gallstones    Generalized abdominal pain 02/16/2021   GERD (gastroesophageal reflux disease)    takes Omeprazole daily   Headache    Heart murmur    Hiatal hernia    History of gastric ulcer    History of migraine    last one about 2wks ago-takes Imitrex prn   History of seizures    Joint pain    Joint swelling    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   Meningioma (HCC)    Migraine without aura and without status migrainosus, not intractable 01/13/2015   Mixed dyslipidemia 07/25/2020   Myringotomy tube status 02/27/2021   Nausea  02/16/2021   Nocturnal leg cramps 02/20/2015   Obesity (BMI 30-39.9) 07/22/2014   Osteoarthritis of left knee 12/02/2012   Overactive bladder 01/02/2021   Peptic ulcer disease    Physical exam 07/22/2014   Recurrent acute serous otitis media of left ear 12/05/2020   Seizure-like activity (HCC) 05/12/2014    Past Surgical History:  Procedure Laterality Date   BALLOON DILATION  04/03/2012   Procedure: BALLOON DILATION;  Surgeon: Theda Belfast, MD;  Location: [facility];  Service: Endoscopy;  Laterality: N/A;   BIOPSY  08/24/2019   Procedure: BIOPSY;  Surgeon: Shellia Cleverly, DO;  Location: [facility];  Service: Gastroenterology;;  EGD and COLON   BRAVO PH STUDY N/A 08/24/2019   Procedure: BRAVO PH STUDY;  Surgeon: Shellia Cleverly, DO;  Location: [facility];  Service: Gastroenterology;  Laterality: N/A;   CHOLECYSTECTOMY N/A 04/24/2018   Procedure: LAPAROSCOPIC CHOLECYSTECTOMY;  Surgeon: Berna Bue, MD;  Location: [facility];  Service: General;  Laterality: N/A;   COLONOSCOPY     COLONOSCOPY WITH PROPOFOL N/A 08/24/2019   Procedure: COLONOSCOPY WITH PROPOFOL;  Surgeon: Shellia Cleverly, DO;  Location: [facility];  Service: Gastroenterology;  Laterality: N/A;   DIAGNOSTIC LAPAROSCOPY  64yrs ago   ESOPHAGEAL MANOMETRY N/A 09/04/2015   Procedure: ESOPHAGEAL MANOMETRY (EM);  Surgeon: Jeani Hawking, MD;  Location: [facility];  Service: Endoscopy;  Laterality: N/A;   ESOPHAGEAL MANOMETRY N/A 12/25/2022   Procedure: ESOPHAGEAL MANOMETRY (EM);  Surgeon: Barron Alvine,  Verlin Dike, DO;  Location: [facility];  Service: Gastroenterology;  Laterality: N/A;   ESOPHAGOGASTRODUODENOSCOPY  04/03/2012   Procedure: ESOPHAGOGASTRODUODENOSCOPY (EGD);  Surgeon: Theda Belfast, MD;  Location: [facility];  Service: Endoscopy;  Laterality: N/A;  EGD w/ balloon dilation   ESOPHAGOGASTRODUODENOSCOPY (EGD) WITH PROPOFOL N/A 02/18/2014   Procedure: ESOPHAGOGASTRODUODENOSCOPY (EGD) WITH PROPOFOL;  Surgeon:  Theda Belfast, MD;  [facility]: [facility];  Service: Endoscopy;  Laterality: N/A;   ESOPHAGOGASTRODUODENOSCOPY (EGD) WITH PROPOFOL N/A 04/22/2014   Procedure: ESOPHAGOGASTRODUODENOSCOPY (EGD) WITH PROPOFOL;  Surgeon: Theda Belfast, MD;  Location: [facility];  Service: Endoscopy;  Laterality: N/A;   ESOPHAGOGASTRODUODENOSCOPY (EGD) WITH PROPOFOL N/A 08/24/2019   Procedure: ESOPHAGOGASTRODUODENOSCOPY (EGD) WITH PROPOFOL;  Surgeon: Shellia Cleverly, DO;  Location: [facility];  Service: Gastroenterology;  Laterality: N/A;   LEFT HEART CATH AND CORONARY ANGIOGRAPHY N/A 04/18/2021   Procedure: LEFT HEART CATH AND CORONARY ANGIOGRAPHY;  Surgeon: Runell Gess, MD;  Location: [facility];  Service: Cardiovascular;  Laterality: N/A;   NASAL SEPTUM SURGERY  09/2016   PH IMPEDANCE STUDY N/A 09/04/2015   Procedure: PH IMPEDANCE STUDY;  Surgeon: Jeani Hawking, MD;  Location: [facility];  Service: Endoscopy;  Laterality: N/A;   REPLACEMENT TOTAL KNEE Right 55yrs ago   TOTAL KNEE ARTHROPLASTY Left 11/30/2012   Procedure: LEFT TOTAL KNEE ARTHROPLASTY;  Surgeon: Nestor Lewandowsky, MD;  Location: [facility];  Service: Orthopedics;  Laterality: Left;    Prior to Admission medications   Medication Sig Start Date End Date Taking? Authorizing Provider  aluminum-magnesium hydroxide-simethicone (MAALOX) 200-200-20 MG/5ML SUSP Take 30 mLs by mouth daily as needed for indigestion (indigestion).   Yes [provider]  amLODipine (NORVASC) 5 MG tablet TAKE 1 TABLET(5 MG) BY MOUTH DAILY 01/09/23  Yes Sandford Craze, NP  Aspirin-Salicylamide-Caffeine (BC HEADACHE PO) Take 1 packet by mouth daily as needed for headache (headaches).   Yes [provider]  atorvastatin (LIPITOR) 20 MG tablet TAKE 1 TABLET(20 MG) BY MOUTH DAILY 01/16/23  Yes Sandford Craze, NP  famotidine (PEPCID) 40 MG tablet TAKE 1 TABLET(40 MG) BY MOUTH TWICE DAILY 11/18/22  Yes Ashanta Amoroso V, DO  metoCLOPramide (REGLAN) 5 MG  tablet TAKE 1 TABLET(5 MG) BY MOUTH THREE TIMES DAILY BEFORE MEALS AS NEEDED 12/06/22  Yes Ragen Laver V, DO  RESTASIS 0.05 % ophthalmic emulsion Place 1 drop into both eyes 2 (two) times daily. 10/29/22  Yes [provider]  sucralfate (CARAFATE) 1 g tablet TAKE 1 TABLET(1 GRAM) BY MOUTH FOUR TIMES DAILY AT BEDTIME WITH MEALS 11/18/22  Yes Niquan Charnley V, DO  topiramate (TOPAMAX) 100 MG tablet TAKE 1 TABLET(100 MG) BY MOUTH TWICE DAILY 11/25/22  Yes Jaffe, Adam R, DO  alendronate (FOSAMAX) 70 MG tablet Take 1 tablet (70 mg total) by mouth every 7 (seven) days. Take with a full glass of water on an empty stomach. Patient not taking: Reported on 01/27/2023 07/12/20   Sandford Craze, NP  aspirin EC 81 MG tablet Take 1 tablet (81 mg total) by mouth daily. Swallow whole. 07/25/20   Revankar, Aundra Dubin, MD  azelastine (ASTELIN) 0.1 % nasal spray Place 2 sprays into both nostrils 2 (two) times daily. 11/24/19   Wanda Plump, MD  Bioflavonoid Products (VITAMIN C) CHEW Chew 1 tablet by mouth daily.    [provider]  cholestyramine (QUESTRAN) 4 g packet Take 1 packet (4 g total) by mouth 2 (two) times daily. Patient not taking: Reported on 12/30/2022 07/19/21  Mairi Stagliano V, DO  dicyclomine (BENTYL) 10 MG capsule Take 1 capsule (10 mg total) by mouth every 6 (six) hours as needed (abdominal cramping/pain). Patient not taking: Reported on 01/27/2023 11/28/22   Desma Wilkowski V, DO  eletriptan (RELPAX) 40 MG tablet TAKE 1 TABLET BY MOUTH AS NEEDED MIGRAINE REPEAT IN 2 HOURS IF SYMPTOMS PERSIST 12/05/22   Everlena Cooper, Adam R, DO  esomeprazole (NEXIUM) 40 MG capsule Take 1 capsule (40 mg total) by mouth 2 (two) times daily. Patient not taking: Reported on 01/27/2023 12/24/22   Kaysa Roulhac V, DO  fluticasone (FLONASE) 50 MCG/ACT nasal spray SHAKE LIQUID AND USE 2 SPRAYS IN [facility] NOSTRIL DAILY Patient not taking: Reported on 01/27/2023 12/23/22   Clayborne Dana, NP  methocarbamol (ROBAXIN) 500  MG tablet Take 500 mg by mouth. Patient not taking: Reported on 01/27/2023 07/07/20   [provider]  MYRBETRIQ 25 MG TB24 tablet TAKE 1 TABLET(25 MG) BY MOUTH DAILY Patient not taking: Reported on 01/27/2023 03/05/21   Sandford Craze, NP  nitroGLYCERIN (NITROSTAT) 0.4 MG SL tablet Place 0.4 mg under the tongue every 5 (five) minutes as needed for chest pain.    [provider]  ondansetron (ZOFRAN) 4 MG tablet TAKE 1 TABLET(4 MG) BY MOUTH EVERY 8 HOURS AS NEEDED FOR NAUSEA OR VOMITING 06/10/22   Sandford Craze, NP  rifaximin ([name]) 550 MG TABS tablet Take 1 tablet (550 mg total) by mouth 3 (three) times daily. Patient not taking: Reported on 01/27/2023 04/02/22   Langston Summerfield, [name] V, DO  Vitamin D, Ergocalciferol, (DRISDOL) 1.25 MG (50000 UNIT) CAPS capsule TAKE 1 CAPSULE BY MOUTH EVERY 7 DAYS Patient not taking: Reported on 12/30/2022 08/21/22   Shellia Cleverly, DO    Current Outpatient Medications  Medication Sig Dispense Refill   aluminum-magnesium hydroxide-simethicone (MAALOX) 200-200-20 MG/5ML SUSP Take 30 mLs by mouth daily as needed for indigestion (indigestion).     amLODipine (NORVASC) 5 MG tablet TAKE 1 TABLET(5 MG) BY MOUTH DAILY 90 tablet 0   Aspirin-Salicylamide-Caffeine (BC HEADACHE PO) Take 1 packet by mouth daily as needed for headache (headaches).     atorvastatin (LIPITOR) 20 MG tablet TAKE 1 TABLET(20 MG) BY MOUTH DAILY 90 tablet 1   famotidine (PEPCID) 40 MG tablet TAKE 1 TABLET(40 MG) BY MOUTH TWICE DAILY 60 tablet 2   metoCLOPramide (REGLAN) 5 MG tablet TAKE 1 TABLET(5 MG) BY MOUTH THREE TIMES DAILY BEFORE MEALS AS NEEDED 60 tablet 2   RESTASIS 0.05 % ophthalmic emulsion Place 1 drop into both eyes 2 (two) times daily.     sucralfate (CARAFATE) 1 g tablet TAKE 1 TABLET(1 GRAM) BY MOUTH FOUR TIMES DAILY AT BEDTIME WITH MEALS 120 tablet 2   topiramate (TOPAMAX) 100 MG tablet TAKE 1 TABLET(100 MG) BY MOUTH TWICE DAILY 180 tablet 0   alendronate  (FOSAMAX) 70 MG tablet Take 1 tablet (70 mg total) by mouth every 7 (seven) days. Take with a full glass of water on an empty stomach. (Patient not taking: Reported on 01/27/2023) 4 tablet 11   aspirin EC 81 MG tablet Take 1 tablet (81 mg total) by mouth daily. Swallow whole. 90 tablet 3   azelastine (ASTELIN) 0.1 % nasal spray Place 2 sprays into both nostrils 2 (two) times daily. 30 mL 1   Bioflavonoid Products (VITAMIN C) CHEW Chew 1 tablet by mouth daily.     cholestyramine (QUESTRAN) 4 g packet Take 1 packet (4 g total) by mouth 2 (two) times daily. (Patient not  taking: Reported on 12/30/2022) 60 each 3   dicyclomine (BENTYL) 10 MG capsule Take 1 capsule (10 mg total) by mouth every 6 (six) hours as needed (abdominal cramping/pain). (Patient not taking: Reported on 01/27/2023) 30 capsule 3   eletriptan (RELPAX) 40 MG tablet TAKE 1 TABLET BY MOUTH AS NEEDED MIGRAINE REPEAT IN 2 HOURS IF SYMPTOMS PERSIST 10 tablet 5   esomeprazole (NEXIUM) 40 MG capsule Take 1 capsule (40 mg total) by mouth 2 (two) times daily. (Patient not taking: Reported on 01/27/2023) 180 capsule 1   fluticasone (FLONASE) 50 MCG/ACT nasal spray SHAKE LIQUID AND USE 2 SPRAYS IN EACH NOSTRIL DAILY (Patient not taking: Reported on 01/27/2023) 48 g 3   methocarbamol (ROBAXIN) 500 MG tablet Take 500 mg by mouth. (Patient not taking: Reported on 01/27/2023)     MYRBETRIQ 25 MG TB24 tablet TAKE 1 TABLET(25 MG) BY MOUTH DAILY (Patient not taking: Reported on 01/27/2023) 30 tablet 2   nitroGLYCERIN (NITROSTAT) 0.4 MG SL tablet Place 0.4 mg under the tongue every 5 (five) minutes as needed for chest pain.     ondansetron (ZOFRAN) 4 MG tablet TAKE 1 TABLET(4 MG) BY MOUTH EVERY 8 HOURS AS NEEDED FOR NAUSEA OR VOMITING 20 tablet 0   rifaximin ([name]) 550 MG TABS tablet Take 1 tablet (550 mg total) by mouth 3 (three) times daily. (Patient not taking: Reported on 01/27/2023) 42 tablet 0   Vitamin D, Ergocalciferol, (DRISDOL) 1.25 MG (50000 UNIT)  CAPS capsule TAKE 1 CAPSULE BY MOUTH EVERY 7 DAYS (Patient not taking: Reported on 12/30/2022) 8 capsule 0   Current Facility-Administered Medications  Medication Dose Route Frequency Provider Last Rate Last Admin   0.9 %  sodium chloride infusion  500 mL Intravenous Once Nafis Farnan V, DO        Allergies as of 01/27/2023 - Review Complete 01/27/2023  Allergen Reaction Noted   Naratriptan  07/16/2016    Family History  Problem Relation Age of Onset   Arthritis Mother    Cancer Mother        breast   Heart disease Mother        died from "heart problems" at age 26, had CAD   Depression Mother    Heart disease Father        had CABG   Kidney disease Father    Arthritis Sister    Heart murmur Sister    Arthritis Sister    Aneurysm Sister    Arthritis Brother        "serious back/neck problems"   Healthy Daughter    Colon cancer Neg Hx    Esophageal cancer Neg Hx    Pancreatic cancer Neg Hx    Stomach cancer Neg Hx     Social History   Socioeconomic History   Marital status: Widowed    Spouse name: Not on file   Number of children: 1   Years of education: Not on file   Highest education level: Some college, no degree  Occupational History   Occupation: Retired  Tobacco Use   Smoking status: Never   Smokeless tobacco: Never  Vaping Use   Vaping Use: Never used  Substance and Sexual Activity   Alcohol use: No   Drug use: No   Sexual activity: Not Currently    Birth control/protection: Post-menopausal  Other Topics Concern   Not on file  Social History Narrative   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   Single  1 daughter- lives in [place]   2 dogs   Enjoys reading, watching television   Pt is right handed   Drinks coffee once a week, green tea daily, soda sometimes    Social Determinants of Health   Financial Resource Strain: Medium Risk (11/13/2022)   Overall Financial Resource Strain (CARDIA)    Difficulty of Paying  Living Expenses: Somewhat hard  Food Insecurity: No Food Insecurity (11/13/2022)   Hunger Vital Sign    Worried About Running Out of Food in the Last Year: Never true    Ran Out of Food in the Last Year: Never true  Transportation Needs: No Transportation Needs (11/13/2022)   PRAPARE - Administrator, Civil Service (Medical): No    Lack of Transportation (Non-Medical): No  Physical Activity: Inactive (11/13/2022)   Exercise Vital Sign    Days of Exercise per Week: 0 days    Minutes of Exercise per Session: 0 min  Stress: Stress Concern Present (11/13/2022)   [employer] of Occupational Health - Occupational Stress Questionnaire    Feeling of Stress : To some extent  Social Connections: Unknown (11/13/2022)   Social Connection and Isolation Panel [NHANES]    Frequency of Communication with Friends and Family: Never    Frequency of Social Gatherings with Friends and Family: Patient declined    Attends Religious Services: Patient declined    Database administrator or Organizations: No    Attends Banker Meetings: Never    Marital Status: Widowed  Intimate Partner Violence: Not At Risk (11/14/2022)   Humiliation, Afraid, Rape, and Kick questionnaire    Fear of Current or Ex-Partner: No    Emotionally Abused: No    Physically Abused: No    Sexually Abused: No    Physical Exam: Vital signs in last 24 hours: @BP  (!) 159/100   Pulse 82   Temp 98.7 F (37.1 C) (Temporal)   Ht 5\' 4"  (1.626 m)   Wt 100 lb (45.4 kg)   SpO2 97%   BMI 17.16 kg/m  GEN: NAD EYE: Sclerae anicteric ENT: MMM CV: Non-tachycardic Pulm: CTA b/l GI: Soft, NT/ND NEURO:  Alert & Oriented x 3   Doristine Locks, DO [facility]   01/27/2023 1:23 PM

## 2023-01-28 ENCOUNTER — Telehealth: Payer: Self-pay

## 2023-01-28 NOTE — Telephone Encounter (Signed)
  Follow up Call-     01/27/2023   12:52 PM  Call back number  Post procedure Call Back phone  # [phone]  Permission to leave phone message Yes     Patient questions:  Do you have a fever, pain , or abdominal swelling? No. Pain Score  0 *  Have you tolerated food without any problems? Yes.    Have you been able to return to your normal activities? Yes.    Do you have any questions about your discharge instructions: Diet   No. Medications  No. Follow up visit  No.  Do you have questions or concerns about your Care? No.  Actions: * If pain score is 4 or above: No action needed, pain <4.

## 2023-01-29 DIAGNOSIS — H16223 Keratoconjunctivitis sicca, not specified as Sjogren's, bilateral: Secondary | ICD-10-CM | POA: Diagnosis not present

## 2023-01-29 DIAGNOSIS — H5213 Myopia, bilateral: Secondary | ICD-10-CM | POA: Diagnosis not present

## 2023-02-11 DIAGNOSIS — Z1231 Encounter for screening mammogram for malignant neoplasm of breast: Secondary | ICD-10-CM | POA: Diagnosis not present

## 2023-02-11 LAB — HM MAMMOGRAPHY

## 2023-02-16 ENCOUNTER — Other Ambulatory Visit: Payer: Self-pay | Admitting: Gastroenterology

## 2023-02-17 ENCOUNTER — Telehealth: Payer: Self-pay

## 2023-02-17 ENCOUNTER — Other Ambulatory Visit (HOSPITAL_COMMUNITY): Payer: Self-pay | Admitting: General Surgery

## 2023-02-17 DIAGNOSIS — K279 Peptic ulcer, site unspecified, unspecified as acute or chronic, without hemorrhage or perforation: Secondary | ICD-10-CM

## 2023-02-17 DIAGNOSIS — K315 Obstruction of duodenum: Secondary | ICD-10-CM

## 2023-02-17 DIAGNOSIS — K449 Diaphragmatic hernia without obstruction or gangrene: Secondary | ICD-10-CM

## 2023-02-17 DIAGNOSIS — R109 Unspecified abdominal pain: Secondary | ICD-10-CM

## 2023-02-17 DIAGNOSIS — K2100 Gastro-esophageal reflux disease with esophagitis, without bleeding: Secondary | ICD-10-CM

## 2023-02-17 NOTE — Telephone Encounter (Signed)
-----   Message from [facility] V, DO sent at 02/17/2023  7:55 AM EDT ----- Regarding: FW: updated EGD Lindsay Frost,  Can you please schedule this patient for EGD with me in the [facility]. Thanks.    ----- Message ----- From: Gaynelle Adu, MD Sent: 02/14/2023   4:32 PM EDT To: Campbell Stall; Doristine Locks V, DO Subject: updated EGD                                    [name],  After much thought - going to do a antrectomy with roux en y reconstruction with poss g and J tubes on this lady.  In reviewing her chart again, I forgot her last egd was in 12/20 from what I can tell. I think it would probably be a good idea for an updated egd prior to surgery just to make sure no surprises in her antrum/d1.   [name] - can you also order ct a/p with contrast?  Re- reflux, abd pain, peptic ulcer disease.   Thanks wilson

## 2023-02-17 NOTE — Telephone Encounter (Signed)
Patient is scheduled EGD on 03/07/23 at 2:30 PM with Dr. Barron Alvine. Patient made aware. Ambulatory referral is placed. Provided EGD instruction over the phone. Sent instruction via MyChart and mail.  Advised patient to call us with questions. Patient verbalized understanding.

## 2023-02-18 ENCOUNTER — Ambulatory Visit (INDEPENDENT_AMBULATORY_CARE_PROVIDER_SITE_OTHER): Payer: 59 | Admitting: Family

## 2023-02-18 ENCOUNTER — Ambulatory Visit (HOSPITAL_BASED_OUTPATIENT_CLINIC_OR_DEPARTMENT_OTHER)
Admission: RE | Admit: 2023-02-18 | Discharge: 2023-02-18 | Disposition: A | Discharge status: Home / Self Care | Payer: 59 | Source: Ambulatory Visit | Attending: Family | Admitting: Family

## 2023-02-18 VITALS — BP 135/85 | HR 95 | Temp 98.0°F | Resp 16 | Wt 100.0 lb

## 2023-02-18 DIAGNOSIS — N3281 Overactive bladder: Secondary | ICD-10-CM

## 2023-02-18 DIAGNOSIS — Z23 Encounter for immunization: Secondary | ICD-10-CM | POA: Diagnosis not present

## 2023-02-18 DIAGNOSIS — I10 Essential (primary) hypertension: Secondary | ICD-10-CM | POA: Diagnosis not present

## 2023-02-18 DIAGNOSIS — E538 Deficiency of other specified B group vitamins: Secondary | ICD-10-CM | POA: Diagnosis not present

## 2023-02-18 DIAGNOSIS — J309 Allergic rhinitis, unspecified: Secondary | ICD-10-CM | POA: Diagnosis not present

## 2023-02-18 DIAGNOSIS — R059 Cough, unspecified: Secondary | ICD-10-CM | POA: Insufficient documentation

## 2023-02-18 DIAGNOSIS — R634 Abnormal weight loss: Secondary | ICD-10-CM

## 2023-02-18 LAB — VITAMIN B12: Vitamin B-12: 289 pg/mL (ref 211–911)

## 2023-02-18 LAB — TSH: TSH: 3.09 u[IU]/mL (ref 0.35–5.50)

## 2023-02-18 MED ORDER — MIRABEGRON ER 25 MG PO TB24
ORAL_TABLET | ORAL | 1 refills | Status: DC
Start: 2023-02-18 — End: 2023-09-11

## 2023-02-18 MED ORDER — BENZONATATE 100 MG PO CAPS: 100.0000 mg | ORAL_CAPSULE | Freq: Three times a day (TID) | ORAL | 2 refills | Status: DC | PRN

## 2023-02-18 NOTE — Assessment & Plan Note (Addendum)
Dry cough for 3 weeks, now productive with yellow sputum. Nocturnal symptoms. Pain between shoulder blades with coughing. Possible reflux-related given history of gastroparesis. Already on maximum PPI and following with GI.  Discussed wedge pillow or elevating the head of her bed at night.  -Order chest x-ray. Normal lung exam. -Prescribe Tessalon for symptomatic relief.

## 2023-02-18 NOTE — Patient Instructions (Addendum)
  During your visit, we discussed your ongoing issues with a persistent cough, gastroparesis, significant weight loss, and overactive bladder. We have outlined a plan to manage each of these issues and will continue to monitor your progress.  YOUR PLAN:  -CHRONIC COUGH: Your cough, which has been bothering you for three weeks and is now producing yellow sputum, may be related to your history of gastroparesis. We will order a chest x-ray and prescribe Tessalon to help relieve your symptoms.  -GASTROPARESIS: Your gastroparesis, a condition that slows or stops the movement of food from your stomach to your small intestine, is causing you pain when you eat. This has led to weight loss. We will continue with your current dietary measures, including Boost shakes, and check your thyroid function. A CT scan of your abdomen has been ordered by your GI specialist.  -OVERACTIVE BLADDER: You have been experiencing symptoms of an overactive bladder, a condition where you have an urgent and frequent need to urinate. You were previously on Myrbetriq, which helped manage your symptoms. We will resume this medication.  -GENERAL HEALTH MAINTENANCE: As part of your general health maintenance, we will administer the Prevnar vaccine today. We will also schedule a follow-up appointment in 6 weeks to reassess your weight and cough.  INSTRUCTIONS:  Please ensure to take the Tessalon as prescribed for your cough. Continue consuming 3 cans of Boost shakes daily in addition to your regular meals. Resume taking Myrbetriq for your overactive bladder. We will be checking your thyroid function today and a CT scan of your abdomen has been ordered by your surgeon We will also administer the Prevnar vaccine today. Please return for a follow-up appointment in 6 weeks to reassess your weight and cough.

## 2023-02-18 NOTE — Assessment & Plan Note (Signed)
Not on supplement.  Check level.  

## 2023-02-18 NOTE — Assessment & Plan Note (Signed)
Overactive Bladder: Previously on Myrbetriq, which was helpful. Medication not being refilled by pharmacy. -Resume Myrbetriq.

## 2023-02-18 NOTE — Progress Notes (Signed)
Subjective:     Patient ID: Lindsay Frost, female    DOB: February 16, 1956, 67 y.o.   MRN: [id number]  Chief Complaint  Patient presents with   Cough    Patient complains of persistent cough x 3 weeks   Weight Loss    Patient reports losing weight, "bad reflux, seeing GI doctor for this"     Discussed the use of AI scribe software for clinical note transcription with the patient, who gave verbal consent to proceed.  History of Present Illness   The patient, with a history of gastroesophageal reflux disease (GERD) and gastroparesis, presents with a persistent cough that started as a dry cough three weeks ago and has since progressed to productive cough with yellow sputum. The cough is particularly bothersome at night, disrupting sleep. Over-the-counter cough suppressants have provided minimal relief. The cough is associated with pain between the shoulder blades, particularly with severe bouts of coughing.  The patient also reports significant weight loss, which she attributes to pain with eating due to her gastroparesis. Despite dietary modifications to avoid trigger foods, the pain persists. She has been using Boost shakes to supplement her diet and maintain her current weight, but reports that it is a struggle.  The patient also mentions a history of overactive bladder, for which she was previously prescribed Myrbetriq. However, the medication was discontinued due to issues with automatic refills at her pharmacy. She requests a renewal of this prescription, as it was helpful in managing her symptoms.     Wt Readings from Last 3 Encounters:  02/18/23 100 lb (45.4 kg)  01/27/23 100 lb (45.4 kg)  12/30/22 100 lb (45.4 kg)        Health Maintenance Due  Topic Date Due   COVID-19 Vaccine (1) Never done   Zoster Vaccines- Shingrix (1 of 2) Never done    Past Medical History:  Diagnosis Date   Acute midline thoracic back pain 02/16/2021   Acute sinusitis 09/05/2014   Allergic  rhinitis 07/16/2016   Allergy 2005   Anemia 2021   Angina pectoris (HCC) 07/25/2020   Arthritis    B12 deficiency 01/02/2021   Back pain    arthritis   Chronic sinusitis 12/05/2020   Depression    takes cymbalta daily   Diarrhea 02/16/2021   Duodenal stenosis    Epigastric pain 07/29/2018   Essential hypertension 05/14/2019   Gallstones    Generalized abdominal pain 02/16/2021   GERD (gastroesophageal reflux disease)    takes Omeprazole daily   Headache    Heart murmur    Hiatal hernia    History of gastric ulcer    History of migraine    last one about 2wks ago-takes Imitrex prn   History of seizures    Joint pain    Joint swelling    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   Meningioma (HCC)    Migraine without aura and without status migrainosus, not intractable 01/13/2015   Mixed dyslipidemia 07/25/2020   Myringotomy tube status 02/27/2021   Nausea 02/16/2021   Nocturnal leg cramps 02/20/2015   Obesity (BMI 30-39.9) 07/22/2014   Osteoarthritis of left knee 12/02/2012   Overactive bladder 01/02/2021   Peptic ulcer disease    Physical exam 07/22/2014   Recurrent acute serous otitis media of left ear 12/05/2020   Seizure-like activity (HCC) 05/12/2014    Past Surgical History:  Procedure Laterality Date   BALLOON DILATION  04/03/2012   Procedure: BALLOON DILATION;  Surgeon: Theda Belfast, MD;  Location: [facility];  Service: Endoscopy;  Laterality: N/A;   BIOPSY  08/24/2019   Procedure: BIOPSY;  Surgeon: Shellia Cleverly, DO;  Location: [facility];  Service: Gastroenterology;;  EGD and COLON   BRAVO PH STUDY N/A 08/24/2019   Procedure: BRAVO PH STUDY;  Surgeon: Shellia Cleverly, DO;  Location: [facility];  Service: Gastroenterology;  Laterality: N/A;   CHOLECYSTECTOMY N/A 04/24/2018   Procedure: LAPAROSCOPIC CHOLECYSTECTOMY;  Surgeon: Berna Bue, MD;  Location: [facility];  Service: General;  Laterality: N/A;    COLONOSCOPY     COLONOSCOPY WITH PROPOFOL N/A 08/24/2019   Procedure: COLONOSCOPY WITH PROPOFOL;  Surgeon: Shellia Cleverly, DO;  Location: [facility];  Service: Gastroenterology;  Laterality: N/A;   DIAGNOSTIC LAPAROSCOPY  96yrs ago   ESOPHAGEAL MANOMETRY N/A 09/04/2015   Procedure: ESOPHAGEAL MANOMETRY (EM);  Surgeon: Jeani Hawking, MD;  Location: [facility];  Service: Endoscopy;  Laterality: N/A;   ESOPHAGEAL MANOMETRY N/A 12/25/2022   Procedure: ESOPHAGEAL MANOMETRY (EM);  Surgeon: Shellia Cleverly, DO;  Location: [facility];  Service: Gastroenterology;  Laterality: N/A;   ESOPHAGOGASTRODUODENOSCOPY  04/03/2012   Procedure: ESOPHAGOGASTRODUODENOSCOPY (EGD);  Surgeon: Theda Belfast, MD;  Location: [facility];  Service: Endoscopy;  Laterality: N/A;  EGD w/ balloon dilation   ESOPHAGOGASTRODUODENOSCOPY (EGD) WITH PROPOFOL N/A 02/18/2014   Procedure: ESOPHAGOGASTRODUODENOSCOPY (EGD) WITH PROPOFOL;  Surgeon: Theda Belfast, MD;  Location: [facility];  Service: Endoscopy;  Laterality: N/A;   ESOPHAGOGASTRODUODENOSCOPY (EGD) WITH PROPOFOL N/A 04/22/2014   Procedure: ESOPHAGOGASTRODUODENOSCOPY (EGD) WITH PROPOFOL;  Surgeon: Theda Belfast, MD;  Location: [facility];  Service: Endoscopy;  Laterality: N/A;   ESOPHAGOGASTRODUODENOSCOPY (EGD) WITH PROPOFOL N/A 08/24/2019   Procedure: ESOPHAGOGASTRODUODENOSCOPY (EGD) WITH PROPOFOL;  Surgeon: Shellia Cleverly, DO;  Location: [facility];  Service: Gastroenterology;  Laterality: N/A;   LEFT HEART CATH AND CORONARY ANGIOGRAPHY N/A 04/18/2021   Procedure: LEFT HEART CATH AND CORONARY ANGIOGRAPHY;  Surgeon: Runell Gess, MD;  Location: [facility];  Service: Cardiovascular;  Laterality: N/A;   NASAL SEPTUM SURGERY  09/2016   PH IMPEDANCE STUDY N/A 09/04/2015   Procedure: PH IMPEDANCE STUDY;  Surgeon: Jeani Hawking, MD;  Location: [facility];  Service: Endoscopy;  Laterality: N/A;   REPLACEMENT TOTAL KNEE Right 21yrs ago   TOTAL KNEE  ARTHROPLASTY Left 11/30/2012   Procedure: LEFT TOTAL KNEE ARTHROPLASTY;  Surgeon: Nestor Lewandowsky, MD;  Location: [facility];  Service: Orthopedics;  Laterality: Left;    Family History  Problem Relation Age of Onset   Arthritis Mother    Cancer Mother        breast   Heart disease Mother        died from "heart problems" at age 65, had CAD   Depression Mother    Heart disease Father        had CABG   Kidney disease Father    Arthritis Sister    Heart murmur Sister    Arthritis Sister    Aneurysm Sister    Arthritis Brother        "serious back/neck problems"   Healthy Daughter    Colon cancer Neg Hx    Esophageal cancer Neg Hx    Pancreatic cancer Neg Hx    Stomach cancer Neg Hx    Rectal cancer Neg Hx     Social History   Socioeconomic History   Marital status: Widowed    Spouse name: Not on file  Number of children: 1   Years of education: Not on file   Highest education level: Associate degree: occupational, technical, or vocational program  Occupational History   Occupation: Retired  Tobacco Use   Smoking status: Never   Smokeless tobacco: Never  Vaping Use   Vaping Use: Never used  Substance and Sexual Activity   Alcohol use: No   Drug use: No   Sexual activity: Not Currently    Birth control/protection: Post-menopausal  Other Topics Concern   Not on file  Social History Narrative   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   Single   1 daughter- lives in [place]   2 dogs   Enjoys reading, watching television   Pt is right handed   Drinks coffee once a week, green tea daily, soda sometimes    Social Determinants of Health   Financial Resource Strain: Low Risk  (02/17/2023)   Overall Financial Resource Strain (CARDIA)    Difficulty of Paying Living Expenses: Not hard at all  Food Insecurity: No Food Insecurity (02/17/2023)   Hunger Vital Sign    Worried About Running Out of Food in the Last Year: Never true    Ran Out of Food in  the Last Year: Never true  Transportation Needs: No Transportation Needs (02/17/2023)   PRAPARE - Administrator, Civil Service (Medical): No    Lack of Transportation (Non-Medical): No  Physical Activity: Insufficiently Active (02/17/2023)   Exercise Vital Sign    Days of Exercise per Week: 1 day    Minutes of Exercise per Session: 10 min  Stress: No Stress Concern Present (02/17/2023)   [employer] of Occupational Health - Occupational Stress Questionnaire    Feeling of Stress : Only a little  Social Connections: Socially Isolated (02/17/2023)   Social Connection and Isolation Panel [NHANES]    Frequency of Communication with Friends and Family: Once a week    Frequency of Social Gatherings with Friends and Family: Once a week    Attends Religious Services: More than 4 times per year    Active Member of [employer] or Organizations: No    Attends Banker Meetings: Never    Marital Status: Widowed  Intimate Partner Violence: Not At Risk (11/14/2022)   Humiliation, Afraid, Rape, and Kick questionnaire    Fear of Current or Ex-Partner: No    Emotionally Abused: No    Physically Abused: No    Sexually Abused: No    Outpatient Medications Prior to Visit  Medication Sig Dispense Refill   alendronate (FOSAMAX) 70 MG tablet Take 1 tablet (70 mg total) by mouth every 7 (seven) days. Take with a full glass of water on an empty stomach. 4 tablet 11   aluminum-magnesium hydroxide-simethicone (MAALOX) 200-200-20 MG/5ML SUSP Take 30 mLs by mouth daily as needed for indigestion (indigestion).     amLODipine (NORVASC) 5 MG tablet TAKE 1 TABLET(5 MG) BY MOUTH DAILY 90 tablet 0   aspirin EC 81 MG tablet Take 1 tablet (81 mg total) by mouth daily. Swallow whole. 90 tablet 3   Aspirin-Salicylamide-Caffeine (BC HEADACHE PO) Take 1 packet by mouth daily as needed for headache (headaches).     atorvastatin (LIPITOR) 20 MG tablet TAKE 1 TABLET(20 MG) BY MOUTH DAILY 90 tablet 1    azelastine (ASTELIN) 0.1 % nasal spray Place 2 sprays into both nostrils 2 (two) times daily. 30 mL 1   Bioflavonoid Products (VITAMIN C) CHEW Chew 1  tablet by mouth daily.     cholestyramine (QUESTRAN) 4 g packet Take 1 packet (4 g total) by mouth 2 (two) times daily. 60 each 3   dicyclomine (BENTYL) 10 MG capsule Take 1 capsule (10 mg total) by mouth every 6 (six) hours as needed (abdominal cramping/pain). 30 capsule 3   eletriptan (RELPAX) 40 MG tablet TAKE 1 TABLET BY MOUTH AS NEEDED MIGRAINE REPEAT IN 2 HOURS IF SYMPTOMS PERSIST 10 tablet 5   esomeprazole (NEXIUM) 40 MG capsule Take 1 capsule (40 mg total) by mouth 2 (two) times daily. 180 capsule 1   famotidine (PEPCID) 40 MG tablet TAKE 1 TABLET(40 MG) BY MOUTH TWICE DAILY 60 tablet 2   fluticasone (FLONASE) 50 MCG/ACT nasal spray SHAKE LIQUID AND USE 2 SPRAYS IN EACH NOSTRIL DAILY 48 g 3   methocarbamol (ROBAXIN) 500 MG tablet Take 500 mg by mouth.     metoCLOPramide (REGLAN) 5 MG tablet TAKE 1 TABLET(5 MG) BY MOUTH THREE TIMES DAILY BEFORE MEALS AS NEEDED 60 tablet 2   nitroGLYCERIN (NITROSTAT) 0.4 MG SL tablet Place 0.4 mg under the tongue every 5 (five) minutes as needed for chest pain.     ondansetron (ZOFRAN) 4 MG tablet TAKE 1 TABLET(4 MG) BY MOUTH EVERY 8 HOURS AS NEEDED FOR NAUSEA OR VOMITING 20 tablet 0   RESTASIS 0.05 % ophthalmic emulsion Place 1 drop into both eyes 2 (two) times daily.     rifaximin ([name]) 550 MG TABS tablet Take 1 tablet (550 mg total) by mouth 3 (three) times daily. 42 tablet 0   sucralfate (CARAFATE) 1 g tablet TAKE 1 TABLET(1 GRAM) BY MOUTH FOUR TIMES DAILY AT BEDTIME WITH MEALS 120 tablet 2   topiramate (TOPAMAX) 100 MG tablet TAKE 1 TABLET(100 MG) BY MOUTH TWICE DAILY 180 tablet 0   Vitamin D, Ergocalciferol, (DRISDOL) 1.25 MG (50000 UNIT) CAPS capsule TAKE 1 CAPSULE BY MOUTH EVERY 7 DAYS 8 capsule 0   MYRBETRIQ 25 MG TB24 tablet TAKE 1 TABLET(25 MG) BY MOUTH DAILY 30 tablet 2   No  facility-administered medications prior to visit.    Allergies  Allergen Reactions   Naratriptan     Ineffective    Review of Systems  Respiratory:  Positive for cough.        Objective:    Physical Exam Constitutional:      General: She is not in acute distress.    Appearance: Normal appearance. She is well-developed.  HENT:     Head: Normocephalic and atraumatic.     Right Ear: External ear normal.     Left Ear: External ear normal.  Eyes:     General: No scleral icterus. Neck:     Thyroid: No thyromegaly.  Cardiovascular:     Rate and Rhythm: Normal rate and regular rhythm.     Heart sounds: Normal heart sounds. No murmur heard. Pulmonary:     Effort: Pulmonary effort is normal. No respiratory distress.     Breath sounds: Normal breath sounds. No wheezing.  Musculoskeletal:     Cervical back: Neck supple.  Skin:    General: Skin is warm and dry.  Neurological:     Mental Status: She is alert and oriented to person, place, and time.  Psychiatric:        Mood and Affect: Mood normal.        Behavior: Behavior normal.        Thought Content: Thought content normal.        Judgment:  Judgment normal.      BP 135/85 (BP Location: Right Arm, Patient Position: Sitting, Cuff Size: Small)   Pulse 95   Temp 98 F (36.7 C) (Oral)   Resp 16   Wt 100 lb (45.4 kg)   SpO2 99%   BMI 17.16 kg/m  Wt Readings from Last 3 Encounters:  02/18/23 100 lb (45.4 kg)  01/27/23 100 lb (45.4 kg)  12/30/22 100 lb (45.4 kg)       Assessment & Plan:   Problem List Items Addressed This Visit       Unprioritized   Weight loss    She has lost about 30 lbs since 2022 due which she attributes to gastroparesis related pain with eating. Continue current dietary measures and Boost shakes. -Check thyroid function today. -CT abdomen ordered by Dr. Andrey Campanile (surgeon) I think CT is a good idea to help exclude underlying malignancy.         Relevant Orders   TSH   Overactive  bladder - Primary    Overactive Bladder: Previously on Myrbetriq, which was helpful. Medication not being refilled by pharmacy. -Resume Myrbetriq.       Relevant Medications   mirabegron ER (MYRBETRIQ) 25 MG TB24 tablet   Essential hypertension    Stable, continue amlodipine 5mg .       Cough     Dry cough for 3 weeks, now productive with yellow sputum. Nocturnal symptoms. Pain between shoulder blades with coughing. Possible reflux-related given history of gastroparesis. Already on maximum PPI and following with GI.  Discussed wedge pillow or elevating the head of her bed at night.  -Order chest x-ray. Normal lung exam. -Prescribe Tessalon for symptomatic relief.      Relevant Orders   DG Chest 2 View   B12 deficiency    Not on supplement. Check level.       Relevant Orders   B12   Allergic rhinitis    Stable on claritin, continue same.       Other Visit Diagnoses     Need for pneumococcal 20-valent conjugate vaccination       Relevant Orders   Pneumococcal conjugate vaccine 20-valent (Prevnar 20) (Completed)     General Health Maintenance: -Administer Prevnar vaccine today. -Follow-up in 6 weeks to reassess weight and cough.  I have changed Payton Doughty. Sartin's Myrbetriq to mirabegron ER. I am also having her start on benzonatate. Additionally, I am having her maintain her Aspirin-Salicylamide-Caffeine (BC HEADACHE PO), azelastine, alendronate, aspirin EC, nitroGLYCERIN, Vitamin C, aluminum-magnesium hydroxide-simethicone, cholestyramine, rifaximin, ondansetron, Vitamin D (Ergocalciferol), topiramate, dicyclomine, eletriptan, metoCLOPramide, fluticasone, esomeprazole, Restasis, methocarbamol, amLODipine, atorvastatin, sucralfate, and famotidine.  Meds ordered this encounter  Medications   mirabegron ER (MYRBETRIQ) 25 MG TB24 tablet    Sig: TAKE 1 TABLET(25 MG) BY MOUTH DAILY    Dispense:  90 tablet    Refill:  1    ZERO refills remain on this prescription. Your patient  is requesting advance approval of refills for this medication to PREVENT ANY MISSED DOSES    Order Specific Question:   Supervising Provider    Answer:   Danise Edge A [[id number]]   benzonatate (TESSALON) 100 MG capsule    Sig: Take 1 capsule (100 mg total) by mouth 3 (three) times daily as needed for cough.    Dispense:  30 capsule    Refill:  2    Order Specific Question:   Supervising Provider    Answer:   Danise Edge A [[id number]]

## 2023-02-18 NOTE — Assessment & Plan Note (Signed)
Stable, continue amlodipine 5mg. 

## 2023-02-18 NOTE — Assessment & Plan Note (Signed)
Stable on claritin, continue same.  

## 2023-02-18 NOTE — Assessment & Plan Note (Signed)
She has lost about 30 lbs since 2022 due which she attributes to gastroparesis related pain with eating. Continue current dietary measures and Boost shakes. -Check thyroid function today. -CT abdomen ordered by Dr. Andrey Campanile (surgeon) I think CT is a good idea to help exclude underlying malignancy.

## 2023-02-19 ENCOUNTER — Telehealth: Payer: Self-pay | Admitting: Family

## 2023-02-19 NOTE — Telephone Encounter (Signed)
Called patient but no answer, left voice mail for patient to call back.   

## 2023-02-19 NOTE — Telephone Encounter (Signed)
B12 level is low. Please begin b12 injections once weekly x 4 weeks then once monthly.

## 2023-02-21 ENCOUNTER — Other Ambulatory Visit: Payer: Self-pay | Admitting: Neurology

## 2023-02-24 ENCOUNTER — Ambulatory Visit
Admission: EM | Admit: 2023-02-24 | Discharge: 2023-02-24 | Disposition: A | Discharge status: Home / Self Care | Payer: 59 | Attending: Nurse Practitioner | Admitting: Nurse Practitioner

## 2023-02-24 ENCOUNTER — Encounter: Payer: Self-pay | Admitting: Family

## 2023-02-24 DIAGNOSIS — J208 Acute bronchitis due to other specified organisms: Secondary | ICD-10-CM | POA: Diagnosis not present

## 2023-02-24 DIAGNOSIS — B9689 Other specified bacterial agents as the cause of diseases classified elsewhere: Secondary | ICD-10-CM | POA: Diagnosis not present

## 2023-02-24 MED ORDER — AMOXICILLIN-POT CLAVULANATE 875-125 MG PO TABS
1.0000 | ORAL_TABLET | Freq: Two times a day (BID) | ORAL | 0 refills | Status: AC
Start: 2023-02-24 — End: 2023-03-06

## 2023-02-24 MED ORDER — PROMETHAZINE-DM 6.25-15 MG/5ML PO SYRP
2.5000 mL | ORAL_SOLUTION | Freq: Four times a day (QID) | ORAL | 0 refills | Status: DC | PRN
Start: 2023-02-24 — End: 2023-05-15

## 2023-02-24 NOTE — Discharge Instructions (Signed)
Start Augmentin twice a day for 10 days Promethazine DM as needed for cough.  Patient's medication can make you drowsy.  Do not drink alcohol or drive on this medication Rest and fluids Please follow-up with your PCP in 1 to 2 days for recheck Please go to the ER if you develop any worsening symptoms

## 2023-02-24 NOTE — Telephone Encounter (Signed)
Patient advised of results and scheduled to come in for initial B12 6/18.

## 2023-02-24 NOTE — ED Triage Notes (Addendum)
Pt presents to UC w/ c/o productive cough x3-4 weeks. Pt states sputum is gray in color. Vomiting x3 days after coughing fit. Fever today. Takes benzonatate without relief.

## 2023-02-24 NOTE — ED Provider Notes (Signed)
[facility]    CSN: [id number] Arrival date & time: 02/24/23  1647      History   Chief Complaint Chief Complaint  Patient presents with   Cough    HPI Lindsay Frost is a 67 y.o. female  presents for evaluation of URI symptoms for 3 weeks. Patient reports associated symptoms of cough, congestion, posttussive vomiting, fever starting today. Denies N/V/D, throat, ear pain, body aches, shortness of breath. Patient does let have a hx of asthma or smoking. No known sick contacts.  Pt has taken Tessalon for symptoms with no improvement.  Patient did have chest x-ray ordered by PCP on June 4 but states results are not available.  Pt has no other concerns at this time.    Cough Associated symptoms: fever     Past Medical History:  Diagnosis Date   Acute midline thoracic back pain 02/16/2021   Acute sinusitis 09/05/2014   Allergic rhinitis 07/16/2016   Allergy 2005   Anemia 2021   Angina pectoris (HCC) 07/25/2020   Arthritis    B12 deficiency 01/02/2021   Back pain    arthritis   Chronic sinusitis 12/05/2020   Depression    takes cymbalta daily   Diarrhea 02/16/2021   Duodenal stenosis    Epigastric pain 07/29/2018   Essential hypertension 05/14/2019   Gallstones    Generalized abdominal pain 02/16/2021   GERD (gastroesophageal reflux disease)    takes Omeprazole daily   Headache    Heart murmur    Hiatal hernia    History of gastric ulcer    History of migraine    last one about 2wks ago-takes Imitrex prn   History of seizures    Joint pain    Joint swelling    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   Meningioma (HCC)    Migraine without aura and without status migrainosus, not intractable 01/13/2015   Mixed dyslipidemia 07/25/2020   Myringotomy tube status 02/27/2021   Nausea 02/16/2021   Nocturnal leg cramps 02/20/2015   Obesity (BMI 30-39.9) 07/22/2014   Osteoarthritis of left knee 12/02/2012    Overactive bladder 01/02/2021   Peptic ulcer disease    Physical exam 07/22/2014   Recurrent acute serous otitis media of left ear 12/05/2020   Seizure-like activity (HCC) 05/12/2014    Patient Active Problem List   Diagnosis Date Noted   Cough 02/18/2023   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, 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, 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 12/30/2022   Atherosclerotic heart disease of native coronary artery without angina pectoris 12/30/2022   Mixed conductive and sensorineural hearing loss of left ear with restricted hearing of right ear 10/10/2022   Recurrent serous otitis media of left ear 10/10/2022   Vertigo 10/10/2022   Bilateral hearing loss 07/15/2022   Headache 04/13/2021   Myringotomy tube status 02/27/2021   B12 deficiency 01/02/2021   Overactive bladder 01/02/2021   Angina pectoris (HCC) 07/25/2020   Mixed dyslipidemia 07/25/2020   Arthritis    Back pain    Heart murmur    Meningioma (HCC)    Duodenal stenosis    Hiatal hernia    Essential hypertension 05/14/2019   Weight loss 07/29/2018   Deviated septum 08/28/2016   Allergic rhinitis 07/16/2016   Nocturnal leg cramps 02/20/2015   Migraine without aura and without status migrainosus, not intractable 01/13/2015   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   Physical exam 07/22/2014   Seizure-like activity (HCC) 05/12/2014  Depression 12/14/2012   GERD (gastroesophageal reflux disease) 12/14/2012   Osteoarthritis of left knee 12/02/2012    Past Surgical History:  Procedure Laterality Date   BALLOON DILATION  04/03/2012   Procedure: BALLOON DILATION;  Surgeon: Theda Belfast, MD;  Location: [facility];  Service: Endoscopy;  Laterality: N/A;   BIOPSY  08/24/2019   Procedure: BIOPSY;  Surgeon: Shellia Cleverly, DO;  Location: [facility];  Service: Gastroenterology;;  EGD and COLON   BRAVO PH STUDY N/A 08/24/2019   Procedure: BRAVO PH STUDY;  Surgeon: Shellia Cleverly, DO;  Location: [facility];  Service: Gastroenterology;  Laterality: N/A;   CHOLECYSTECTOMY N/A 04/24/2018   Procedure: LAPAROSCOPIC CHOLECYSTECTOMY;  Surgeon: Berna Bue, MD;  Location: [facility];  Service: General;  Laterality: N/A;   COLONOSCOPY     COLONOSCOPY WITH PROPOFOL N/A 08/24/2019   Procedure: COLONOSCOPY WITH PROPOFOL;  Surgeon: Shellia Cleverly, DO;  Location: [facility];  Service: Gastroenterology;  Laterality: N/A;   DIAGNOSTIC LAPAROSCOPY  23yrs ago   ESOPHAGEAL MANOMETRY N/A 09/04/2015   Procedure: ESOPHAGEAL MANOMETRY (EM);  Surgeon: Jeani Hawking, MD;  Location: [facility];  Service: Endoscopy;  Laterality: N/A;   ESOPHAGEAL MANOMETRY N/A 12/25/2022   Procedure: ESOPHAGEAL MANOMETRY (EM);  Surgeon: Shellia Cleverly, DO;  Location: [facility];  Service: Gastroenterology;  Laterality: N/A;   ESOPHAGOGASTRODUODENOSCOPY  04/03/2012   Procedure: ESOPHAGOGASTRODUODENOSCOPY (EGD);  Surgeon: Theda Belfast, MD;  Location: [facility];  Service: Endoscopy;  Laterality: N/A;  EGD w/ balloon dilation   ESOPHAGOGASTRODUODENOSCOPY (EGD) WITH PROPOFOL N/A 02/18/2014   Procedure: ESOPHAGOGASTRODUODENOSCOPY (EGD) WITH PROPOFOL;  Surgeon: Theda Belfast, MD;  Location: [facility];  Service: Endoscopy;  Laterality: N/A;   ESOPHAGOGASTRODUODENOSCOPY (EGD) WITH PROPOFOL N/A 04/22/2014   Procedure: ESOPHAGOGASTRODUODENOSCOPY (EGD) WITH PROPOFOL;  Surgeon: Theda Belfast, MD;  Location: [facility];  Service: Endoscopy;  Laterality: N/A;   ESOPHAGOGASTRODUODENOSCOPY (EGD) WITH PROPOFOL N/A 08/24/2019   Procedure: ESOPHAGOGASTRODUODENOSCOPY (EGD) WITH PROPOFOL;  Surgeon: Shellia Cleverly, DO;  Location: [facility];  Service: Gastroenterology;  Laterality: N/A;   LEFT HEART CATH AND CORONARY ANGIOGRAPHY N/A 04/18/2021   Procedure: LEFT HEART CATH AND CORONARY ANGIOGRAPHY;  Surgeon: Runell Gess, MD;  Location: [facility];  Service: Cardiovascular;  Laterality: N/A;    NASAL SEPTUM SURGERY  09/2016   PH IMPEDANCE STUDY N/A 09/04/2015   Procedure: PH IMPEDANCE STUDY;  Surgeon: Jeani Hawking, MD;  Location: [facility];  Service: Endoscopy;  Laterality: N/A;   REPLACEMENT TOTAL KNEE Right 68yrs ago   TOTAL KNEE ARTHROPLASTY Left 11/30/2012   Procedure: LEFT TOTAL KNEE ARTHROPLASTY;  Surgeon: Nestor Lewandowsky, MD;  Location: [facility];  Service: Orthopedics;  Laterality: Left;    OB History   No obstetric history on file.      Home Medications    Prior to Admission medications   Medication Sig Start Date End Date Taking? Authorizing Provider  amoxicillin-clavulanate (AUGMENTIN) 875-125 MG tablet Take 1 tablet by mouth every 12 (twelve) hours for 10 days. 02/24/23 03/06/23 Yes Radford Pax, NP  promethazine-dextromethorphan (PROMETHAZINE-DM) 6.25-15 MG/5ML syrup Take 2.5 mLs by mouth 4 (four) times daily as needed for cough. 02/24/23  Yes Radford Pax, NP  alendronate (FOSAMAX) 70 MG tablet Take 1 tablet (70 mg total) by mouth every 7 (seven) days. Take with a full glass of water on an empty stomach. 07/12/20   Sandford Craze, NP  aluminum-magnesium hydroxide-simethicone (MAALOX) 200-200-20 MG/5ML SUSP Take 30 mLs by  mouth daily as needed for indigestion (indigestion).    [provider]  amLODipine (NORVASC) 5 MG tablet TAKE 1 TABLET(5 MG) BY MOUTH DAILY 01/09/23   Sandford Craze, NP  aspirin EC 81 MG tablet Take 1 tablet (81 mg total) by mouth daily. Swallow whole. 07/25/20   Revankar, Aundra Dubin, MD  Aspirin-Salicylamide-Caffeine (BC HEADACHE PO) Take 1 packet by mouth daily as needed for headache (headaches).    [provider]  atorvastatin (LIPITOR) 20 MG tablet TAKE 1 TABLET(20 MG) BY MOUTH DAILY 01/16/23   Sandford Craze, NP  azelastine (ASTELIN) 0.1 % nasal spray Place 2 sprays into both nostrils 2 (two) times daily. 11/24/19   Wanda Plump, MD  benzonatate (TESSALON) 100 MG capsule Take 1 capsule (100 mg total) by mouth 3 (three)  times daily as needed for cough. 02/18/23   Sandford Craze, NP  Bioflavonoid Products (VITAMIN C) CHEW Chew 1 tablet by mouth daily.    [provider]  cholestyramine (QUESTRAN) 4 g packet Take 1 packet (4 g total) by mouth 2 (two) times daily. 07/19/21   Cirigliano, Vito V, DO  dicyclomine (BENTYL) 10 MG capsule Take 1 capsule (10 mg total) by mouth every 6 (six) hours as needed (abdominal cramping/pain). 11/28/22   Cirigliano, Vito V, DO  eletriptan (RELPAX) 40 MG tablet TAKE 1 TABLET BY MOUTH AS NEEDED MIGRAINE REPEAT IN 2 HOURS IF SYMPTOMS PERSIST 12/05/22   Everlena Cooper, Adam R, DO  esomeprazole (NEXIUM) 40 MG capsule Take 1 capsule (40 mg total) by mouth 2 (two) times daily. 12/24/22   Cirigliano, Vito V, DO  famotidine (PEPCID) 40 MG tablet TAKE 1 TABLET(40 MG) BY MOUTH TWICE DAILY 02/17/23   Cirigliano, Vito V, DO  fluticasone (FLONASE) 50 MCG/ACT nasal spray SHAKE LIQUID AND USE 2 SPRAYS IN EACH NOSTRIL DAILY 12/23/22   Clayborne Dana, NP  methocarbamol (ROBAXIN) 500 MG tablet Take 500 mg by mouth. 07/07/20   [provider]  metoCLOPramide (REGLAN) 5 MG tablet TAKE 1 TABLET(5 MG) BY MOUTH THREE TIMES DAILY BEFORE MEALS AS NEEDED 12/06/22   Cirigliano, Vito V, DO  mirabegron ER (MYRBETRIQ) 25 MG TB24 tablet TAKE 1 TABLET(25 MG) BY MOUTH DAILY 02/18/23   Sandford Craze, NP  nitroGLYCERIN (NITROSTAT) 0.4 MG SL tablet Place 0.4 mg under the tongue every 5 (five) minutes as needed for chest pain.    [provider]  ondansetron (ZOFRAN) 4 MG tablet TAKE 1 TABLET(4 MG) BY MOUTH EVERY 8 HOURS AS NEEDED FOR NAUSEA OR VOMITING 06/10/22   Sandford Craze, NP  RESTASIS 0.05 % ophthalmic emulsion Place 1 drop into both eyes 2 (two) times daily. 10/29/22   [provider]  rifaximin ([name]) 550 MG TABS tablet Take 1 tablet (550 mg total) by mouth 3 (three) times daily. 04/02/22   Cirigliano, Vito V, DO  sucralfate (CARAFATE) 1 g tablet TAKE 1 TABLET(1 GRAM) BY MOUTH FOUR  TIMES DAILY AT BEDTIME WITH MEALS 02/17/23   Cirigliano, Vito V, DO  topiramate (TOPAMAX) 100 MG tablet TAKE 1 TABLET(100 MG) BY MOUTH TWICE DAILY 02/21/23   Drema Dallas, DO  Vitamin D, Ergocalciferol, (DRISDOL) 1.25 MG (50000 UNIT) CAPS capsule TAKE 1 CAPSULE BY MOUTH EVERY 7 DAYS 08/21/22   Cirigliano, Verlin Dike, DO    Family History Family History  Problem Relation Age of Onset   Arthritis Mother    Cancer Mother        breast   Heart disease Mother  died from "heart problems" at age 90, had CAD   Depression Mother    Heart disease Father        had CABG   Kidney disease Father    Arthritis Sister    Heart murmur Sister    Arthritis Sister    Aneurysm Sister    Arthritis Brother        "serious back/neck problems"   Healthy Daughter    Colon cancer Neg Hx    Esophageal cancer Neg Hx    Pancreatic cancer Neg Hx    Stomach cancer Neg Hx    Rectal cancer Neg Hx     Social History Social History   Tobacco Use   Smoking status: Never   Smokeless tobacco: Never  Vaping Use   Vaping Use: Never used  Substance Use Topics   Alcohol use: No   Drug use: No     Allergies   Naratriptan   Review of Systems Review of Systems  Constitutional:  Positive for fever.  HENT:  Positive for congestion.   Respiratory:  Positive for cough.      Physical Exam Triage Vital Signs ED Triage Vitals  Enc Vitals Group     BP 02/24/23 1700 (!) 133/91     Pulse Rate 02/24/23 1700 (!) 111     Resp 02/24/23 1700 18     Temp 02/24/23 1700 99.9 F (37.7 C)     Temp Source 02/24/23 1700 Oral     SpO2 02/24/23 1700 96 %     Weight --      Height --      Head Circumference --      Peak Flow --      Pain Score 02/24/23 1703 2     Pain Loc --      Pain Edu? --      Excl. in GC? --    No data found.  Updated Vital Signs BP (!) 133/91 (BP Location: Right Arm)   Pulse (!) 111   Temp 99.9 F (37.7 C) (Oral)   Resp 18   SpO2 96%   Visual Acuity Right Eye Distance:   Left  Eye Distance:   Bilateral Distance:    Right Eye Near:   Left Eye Near:    Bilateral Near:     Physical Exam Vitals and nursing note reviewed.  Constitutional:      General: She is not in acute distress.    Appearance: She is well-developed. She is not ill-appearing.  HENT:     Head: Normocephalic and atraumatic.     Right Ear: Tympanic membrane and ear canal normal.     Left Ear: Tympanic membrane and ear canal normal.     Nose: Congestion present.     Mouth/Throat:     Mouth: Mucous membranes are moist.     Pharynx: Oropharynx is clear. Uvula midline. No posterior oropharyngeal erythema.     Tonsils: No tonsillar exudate or tonsillar abscesses.  Eyes:     Conjunctiva/sclera: Conjunctivae normal.     Pupils: Pupils are equal, round, and reactive to light.  Cardiovascular:     Rate and Rhythm: Normal rate and regular rhythm.     Heart sounds: Normal heart sounds.  Pulmonary:     Effort: Pulmonary effort is normal.     Breath sounds: Normal breath sounds.  Musculoskeletal:     Cervical back: Normal range of motion and neck supple.  Lymphadenopathy:     Cervical: No cervical  adenopathy.  Skin:    General: Skin is warm and dry.  Neurological:     General: No focal deficit present.     Mental Status: She is alert and oriented to person, place, and time.  Psychiatric:        Mood and Affect: Mood normal.        Behavior: Behavior normal.      UC Treatments / Results  Labs (all labs ordered are listed, but only abnormal results are displayed) Labs Reviewed - No data to display  Comprehensive metabolic panel Order: [id number] Status: Final result     Visible to patient: Yes (seen)     Next appt: 03/04/2023 at 10:30 AM in Family Medicine ([facility]-SW Nurse)     Dx: Gastroenteritis   2 Result Notes     1 Patient Communication          Component Ref Range & Units 9 mo ago (05/02/22) 1 yr ago (04/13/21) 2 yr ago (02/16/21) 2 yr ago (07/25/20) 2 yr ago (07/04/20) 2 yr  ago (04/11/20) 3 yr ago (05/14/19)  Sodium 135 - 145 mEq/L 137 144 R 140 R 142 R 142 R 141 141 R  Potassium 3.5 - 5.1 mEq/L 4.0 4.1 R 4.1 R 4.0 R 3.8 R 3.9 3.7 R  Chloride 96 - 112 mEq/L 98 108 High  R 106 R 108 High  R 112 High  R, CM 110 110 R  CO2 19 - 32 mEq/L 22 21 R 22 R 22 R 24 R 25 20 R  Glucose, Bld 70 - 99 mg/dL 51 Low  87 R 89 R, CM 75 R 84 R, CM 79 75 R, CM  BUN 6 - 23 mg/dL 18 15 R 10 R 15 R 14 R 18 21 R  Creatinine, Ser 0.40 - 1.20 mg/dL 4.09 8.11 High  R 9.14 High  R, CM 1.02 High  R 0.99 R, CM 0.78 0.78 R, CM  Total Bilirubin 0.2 - 1.2 mg/dL 0.4  0.4  0.3    Alkaline Phosphatase 39 - 117 U/L 86        AST 0 - 37 U/L 19  17 R  15 R    ALT 0 - 35 U/L 13  11 R  8 R    Total Protein 6.0 - 8.3 g/dL 6.7  7.0 R  6.8 R    Albumin 3.5 - 5.2 g/dL 4.4        GFR >78.29 mL/min 60.41     74.40   Comment: Calculated using the CKD-EPI Creatinine Equation (2021)  Calcium 8.4 - 10.5 mg/dL 9.2 9.3 R 9.4 R 9.3 R 9.2 R 9.0 9.3 R  Resulting Agency [place] [facility]         Specimen Collected: 05/02/22 15:18 Last Resulted: 05/03/22 10:59        EKG   Radiology No results found.  Procedures Procedures (including critical care time)  Medications Ordered in UC Medications - No data to display  Initial Impression / Assessment and Plan / UC Course  I have reviewed the triage vital signs and the nursing notes.  Pertinent labs & imaging results that were available during my care of the patient were reviewed by me and considered in my medical decision making (see chart for details).     Reviewed exam and symptoms with patient.  Low-grade fever in clinic at 99.9 degrees with mild tachycardia in setting of low-grade fever.  Cough x 3 weeks.  Chest x-ray was read by radiologist couple of hours ago.  I did review.  Does show masslike opacity in the left midlung.  I did let patient know chest x-ray was  abnormal and that she should follow-up with her PCP tomorrow.  Patient verbalized understanding.  Will start Augmentin based on length of symptoms.  Promethazine DM as needed for cough.  Side effect profile reviewed.  Strict ER precautions reviewed and patient verbalized understanding Final Clinical Impressions(s) / UC Diagnoses   Final diagnoses:  Acute bacterial bronchitis     Discharge Instructions      Start Augmentin twice a day for 10 days Promethazine DM as needed for cough.  Patient's medication can make you drowsy.  Do not drink alcohol or drive on this medication Rest and fluids Please follow-up with your PCP in 1 to 2 days for recheck Please go to the ER if you develop any worsening symptoms    ED Prescriptions     Medication Sig Dispense Auth. Provider   amoxicillin-clavulanate (AUGMENTIN) 875-125 MG tablet Take 1 tablet by mouth every 12 (twelve) hours for 10 days. 20 tablet Radford Pax, NP   promethazine-dextromethorphan (PROMETHAZINE-DM) 6.25-15 MG/5ML syrup Take 2.5 mLs by mouth 4 (four) times daily as needed for cough. 118 mL Radford Pax, NP      PDMP not reviewed this encounter.   Radford Pax, NP 02/24/23 1742

## 2023-02-25 ENCOUNTER — Other Ambulatory Visit: Payer: Self-pay | Admitting: Family

## 2023-02-25 DIAGNOSIS — R918 Other nonspecific abnormal finding of lung field: Secondary | ICD-10-CM

## 2023-02-26 ENCOUNTER — Encounter: Payer: Self-pay | Admitting: Gastroenterology

## 2023-02-26 NOTE — Telephone Encounter (Signed)
Spoke to patient today and she is a little better, still some cough

## 2023-03-03 NOTE — Progress Notes (Unsigned)
Lindsay Frost is a 67 y.o. female presents to the office today for 1/4 weekly B12 injection per physician's orders. Original order: 02/19/23 "B12 level is low. Please begin b12 injections once weekly x 4 weeks then once monthly. " Cyanocobalamin 1000 mg/ml IM was administered left deltoid today. Patient tolerated injection. Patient due for follow up labs/provider appt: No.  Patient next injection due: 1 week for 2/4, appt made Yes

## 2023-03-04 ENCOUNTER — Ambulatory Visit (INDEPENDENT_AMBULATORY_CARE_PROVIDER_SITE_OTHER): Payer: 59 | Admitting: *Deleted

## 2023-03-04 ENCOUNTER — Ambulatory Visit (HOSPITAL_BASED_OUTPATIENT_CLINIC_OR_DEPARTMENT_OTHER)
Admission: RE | Admit: 2023-03-04 | Discharge: 2023-03-04 | Disposition: A | Discharge status: Home / Self Care | Payer: 59 | Source: Ambulatory Visit | Attending: Family | Admitting: Family

## 2023-03-04 DIAGNOSIS — E538 Deficiency of other specified B group vitamins: Secondary | ICD-10-CM

## 2023-03-04 DIAGNOSIS — R918 Other nonspecific abnormal finding of lung field: Secondary | ICD-10-CM | POA: Insufficient documentation

## 2023-03-04 MED ORDER — CYANOCOBALAMIN 1000 MCG/ML IJ SOLN
1000.0000 ug | Freq: Once | INTRAMUSCULAR | Status: AC
Start: 2023-03-04 — End: 2023-03-04
  Administered 2023-03-04: 1000 ug via INTRAMUSCULAR

## 2023-03-07 ENCOUNTER — Encounter: Payer: 59 | Admitting: Gastroenterology

## 2023-03-10 ENCOUNTER — Telehealth: Payer: Self-pay | Admitting: *Deleted

## 2023-03-10 DIAGNOSIS — R918 Other nonspecific abnormal finding of lung field: Secondary | ICD-10-CM

## 2023-03-10 MED ORDER — AMOXICILLIN-POT CLAVULANATE 875-125 MG PO TABS: 1.0000 | ORAL_TABLET | Freq: Two times a day (BID) | ORAL | 0 refills | Status: AC

## 2023-03-10 NOTE — Telephone Encounter (Signed)
Advised [facility] to change augmentin quant to 10 tabs.

## 2023-03-10 NOTE — Telephone Encounter (Signed)
Got a call report from imaging CT Chest W/O contrast 6/18.  They want to make sure we see the impression #1.  The report is in Epic.

## 2023-03-10 NOTE — Telephone Encounter (Signed)
Reviewed CT lung results.  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  Will refer to pulmonology for further evaluation.  Reviewed case with Dr. Vassie Loll- he recommends empiric rx with augmentin bid for 2 weeks in addition to pulmonary referral. Pt states that she completed a 10 day course of augmentin, will add an additional 5 day course for a 2 week treatment    Discussed CT results and need for additional follow up.  I gave her the number to pulmonology in case she does not hear from them in the next 24 hours.  Pt verbalizes understanding.

## 2023-03-11 ENCOUNTER — Ambulatory Visit (INDEPENDENT_AMBULATORY_CARE_PROVIDER_SITE_OTHER): Payer: 59

## 2023-03-11 DIAGNOSIS — E538 Deficiency of other specified B group vitamins: Secondary | ICD-10-CM | POA: Diagnosis not present

## 2023-03-11 MED ORDER — CYANOCOBALAMIN 1000 MCG/ML IJ SOLN
1000.0000 ug | Freq: Once | INTRAMUSCULAR | Status: AC
Start: 2023-03-11 — End: 2023-03-11
  Administered 2023-03-11: 1000 ug via INTRAMUSCULAR

## 2023-03-11 NOTE — Progress Notes (Signed)
Lindsay Frost is a 67 y.o. female presents to the office today for 2/4 weekly B12 injection per physician's orders. Original order: 02/19/23 "B12 level is low. Please begin b12 injections once weekly x 4 weeks then once monthly. " Cyanocobalamin 1000 mg/ml IM was administered left deltoid today. Patient tolerated injection. Patient due for follow up labs/provider appt: No.  Patient next injection due: 1 week for 3/4, appt made Yes

## 2023-03-17 NOTE — Progress Notes (Unsigned)
Lindsay Frost is a 67 y.o. female presents to the office today for 3/4 weekly B12 injection, per physician's orders. Original order: 02/19/23 "B12 level is low. Please begin b12 injections once weekly x 4 weeks then once monthly. " Cyanocobalamin 1000 mg/ml IM was administered L deltoid today. Patient tolerated injection. Patient due for follow up labs/provider appt: No.  Patient next injection due: 1 week for 4/4 weekly B12 injection, appt made No  Pt says she has a lot going on next week, so she will have her next injection at her appointment that is pending on 04/01/23 with [facility]. She will have the injections monthly thereafter.  Creft, Feliberto Harts

## 2023-03-18 ENCOUNTER — Encounter (HOSPITAL_BASED_OUTPATIENT_CLINIC_OR_DEPARTMENT_OTHER): Payer: Self-pay

## 2023-03-18 ENCOUNTER — Ambulatory Visit (INDEPENDENT_AMBULATORY_CARE_PROVIDER_SITE_OTHER): Payer: 59

## 2023-03-18 ENCOUNTER — Ambulatory Visit (HOSPITAL_BASED_OUTPATIENT_CLINIC_OR_DEPARTMENT_OTHER)
Admission: RE | Admit: 2023-03-18 | Discharge: 2023-03-18 | Disposition: A | Discharge status: Home / Self Care | Payer: 59 | Source: Ambulatory Visit | Attending: General Surgery | Admitting: General Surgery

## 2023-03-18 DIAGNOSIS — R109 Unspecified abdominal pain: Secondary | ICD-10-CM | POA: Insufficient documentation

## 2023-03-18 DIAGNOSIS — R634 Abnormal weight loss: Secondary | ICD-10-CM | POA: Diagnosis not present

## 2023-03-18 DIAGNOSIS — N3289 Other specified disorders of bladder: Secondary | ICD-10-CM | POA: Diagnosis not present

## 2023-03-18 DIAGNOSIS — K3184 Gastroparesis: Secondary | ICD-10-CM | POA: Diagnosis not present

## 2023-03-18 DIAGNOSIS — E538 Deficiency of other specified B group vitamins: Secondary | ICD-10-CM | POA: Diagnosis not present

## 2023-03-18 DIAGNOSIS — K279 Peptic ulcer, site unspecified, unspecified as acute or chronic, without hemorrhage or perforation: Secondary | ICD-10-CM | POA: Diagnosis not present

## 2023-03-18 MED ORDER — IOHEXOL 300 MG/ML  SOLN
100.0000 mL | Freq: Once | INTRAMUSCULAR | Status: AC | PRN
  Administered 2023-03-18: 100 mL via INTRAVENOUS

## 2023-03-18 MED ORDER — CYANOCOBALAMIN 1000 MCG/ML IJ SOLN
1000.0000 ug | Freq: Once | INTRAMUSCULAR | Status: AC
Start: 2023-03-18 — End: 2023-03-18
  Administered 2023-03-18: 1000 ug via INTRAMUSCULAR

## 2023-03-26 ENCOUNTER — Ambulatory Visit (INDEPENDENT_AMBULATORY_CARE_PROVIDER_SITE_OTHER): Payer: 59 | Admitting: Pulmonary Disease

## 2023-03-26 ENCOUNTER — Other Ambulatory Visit: Payer: Self-pay

## 2023-03-26 ENCOUNTER — Encounter (HOSPITAL_BASED_OUTPATIENT_CLINIC_OR_DEPARTMENT_OTHER): Payer: Self-pay | Admitting: Pulmonary Disease

## 2023-03-26 ENCOUNTER — Encounter: Payer: Self-pay | Admitting: Gastroenterology

## 2023-03-26 VITALS — BP 122/64 | HR 66 | Temp 97.9°F | Ht 64.0 in | Wt 103.8 lb

## 2023-03-26 DIAGNOSIS — J984 Other disorders of lung: Secondary | ICD-10-CM | POA: Diagnosis not present

## 2023-03-26 MED ORDER — AMOXICILLIN-POT CLAVULANATE 875-125 MG PO TABS: 1.0000 | ORAL_TABLET | Freq: Two times a day (BID) | ORAL | 0 refills | Status: AC

## 2023-03-26 MED ORDER — ALBUTEROL SULFATE HFA 108 (90 BASE) MCG/ACT IN AERS: 2.0000 | INHALATION_SPRAY | Freq: Four times a day (QID) | RESPIRATORY_TRACT | 1 refills | Status: DC | PRN

## 2023-03-26 NOTE — Progress Notes (Signed)
Subjective:   PATIENT ID: Leanna Battles GENDER: female DOB: 1956-01-02, MRN: [id number]  Chief Complaint  Patient presents with   Consult    Consult. Here to talk about a lung mass.     Reason for Visit: New consult for lung mass  Ms. Lavoris Canizales is a 67 year old female never smoker with gastroparesis, GERD, hiatal hernia, migraine, HTN, allergic rhinitis, deviated septum, hx seizure- well controlled, arthritis who presents with weight loss and incidental cavitary lung lesions.  She had bronchitis x 3 weeks and CXR obtained by her PCP, NP Peggyann Juba and found with CXR demonstrated 3.6 cm in the left midlung. Follow-up CT 03/04/23 demonstrated thick walled cavitary process in the LLL and lingula, spanning the fissure with a bilobed appearance. Right lower lobe tree in bud. CT A/P 03/18/23 ordered by her surgeon demonstrated RML/RML tree in bud and findings consistent with gastroparesis.  She reports in the last 3 years she has lost 70 lbs with 35 lbs in the last 6 months. Denies night sweats. Sleeps with heating pad bc she feels cold all the time. Has felt more fatigued in the last year. Worsened by activity. Has shortness of breath associated with coughing. Some wheezing not primary issue. Prior to her bronchitis 3 weeks ago she did not have respiratory issues. Denies hemoptysis.   Social History: Previously Warehouse manager jobs: Museum/gallery exhibitions officer  Burned coal and wood - during childhood for heat for 17 years Heavy second smoke exposure from husband x 15 years  I have personally reviewed patient's past medical/family/social history, allergies, current medications.  Past Medical History:  Diagnosis Date   Acute midline thoracic back pain 02/16/2021   Acute sinusitis 09/05/2014   Allergic rhinitis 07/16/2016   Allergy 2005   Anemia 2021   Angina pectoris (HCC) 07/25/2020   Arthritis    B12 deficiency 01/02/2021   Back pain    arthritis   Chronic sinusitis 12/05/2020    Depression    takes cymbalta daily   Diarrhea 02/16/2021   Duodenal stenosis    Epigastric pain 07/29/2018   Essential hypertension 05/14/2019   Gallstones    Generalized abdominal pain 02/16/2021   GERD (gastroesophageal reflux disease)    takes Omeprazole daily   Headache    Heart murmur    Hiatal hernia    History of gastric ulcer    History of migraine    last one about 2wks ago-takes Imitrex prn   History of seizures    Joint pain    Joint swelling    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   Meningioma (HCC)    Migraine without aura and without status migrainosus, not intractable 01/13/2015   Mixed dyslipidemia 07/25/2020   Myringotomy tube status 02/27/2021   Nausea 02/16/2021   Nocturnal leg cramps 02/20/2015   Obesity (BMI 30-39.9) 07/22/2014   Osteoarthritis of left knee 12/02/2012   Overactive bladder 01/02/2021   Peptic ulcer disease    Physical exam 07/22/2014   Recurrent acute serous otitis media of left ear 12/05/2020   Seizure-like activity (HCC) 05/12/2014     Family History  Problem Relation Age of Onset   Arthritis Mother    Cancer Mother        breast   Heart disease Mother        died from "heart problems" at age 54, had CAD   Depression Mother    Heart disease Father        had CABG  Kidney disease Father    Arthritis Sister    Heart murmur Sister    Arthritis Sister    Aneurysm Sister    Arthritis Brother        "serious back/neck problems"   Healthy Daughter    Colon cancer Neg Hx    Esophageal cancer Neg Hx    Pancreatic cancer Neg Hx    Stomach cancer Neg Hx    Rectal cancer Neg Hx      Social History   Occupational History   Occupation: Retired  Tobacco Use   Smoking status: Never   Smokeless tobacco: Never  Vaping Use   Vaping Use: Never used  Substance and Sexual Activity   Alcohol use: No   Drug use: No   Sexual activity: Not Currently    Birth control/protection:  Post-menopausal    Allergies  Allergen Reactions   Naratriptan     Ineffective     Outpatient Medications Prior to Visit  Medication Sig Dispense Refill   alendronate (FOSAMAX) 70 MG tablet Take 1 tablet (70 mg total) by mouth every 7 (seven) days. Take with a full glass of water on an empty stomach. 4 tablet 11   aluminum-magnesium hydroxide-simethicone (MAALOX) 200-200-20 MG/5ML SUSP Take 30 mLs by mouth daily as needed for indigestion (indigestion).     amLODipine (NORVASC) 5 MG tablet TAKE 1 TABLET(5 MG) BY MOUTH DAILY 90 tablet 0   aspirin EC 81 MG tablet Take 1 tablet (81 mg total) by mouth daily. Swallow whole. 90 tablet 3   Aspirin-Salicylamide-Caffeine (BC HEADACHE PO) Take 1 packet by mouth daily as needed for headache (headaches).     atorvastatin (LIPITOR) 20 MG tablet TAKE 1 TABLET(20 MG) BY MOUTH DAILY 90 tablet 1   azelastine (ASTELIN) 0.1 % nasal spray Place 2 sprays into both nostrils 2 (two) times daily. 30 mL 1   benzonatate (TESSALON) 100 MG capsule Take 1 capsule (100 mg total) by mouth 3 (three) times daily as needed for cough. 30 capsule 2   Bioflavonoid Products (VITAMIN C) CHEW Chew 1 tablet by mouth daily.     cholestyramine (QUESTRAN) 4 g packet Take 1 packet (4 g total) by mouth 2 (two) times daily. 60 each 3   dicyclomine (BENTYL) 10 MG capsule Take 1 capsule (10 mg total) by mouth every 6 (six) hours as needed (abdominal cramping/pain). 30 capsule 3   eletriptan (RELPAX) 40 MG tablet TAKE 1 TABLET BY MOUTH AS NEEDED MIGRAINE REPEAT IN 2 HOURS IF SYMPTOMS PERSIST 10 tablet 5   esomeprazole (NEXIUM) 40 MG capsule Take 1 capsule (40 mg total) by mouth 2 (two) times daily. 180 capsule 1   famotidine (PEPCID) 40 MG tablet TAKE 1 TABLET(40 MG) BY MOUTH TWICE DAILY 60 tablet 2   fluticasone (FLONASE) 50 MCG/ACT nasal spray SHAKE LIQUID AND USE 2 SPRAYS IN EACH NOSTRIL DAILY 48 g 3   methocarbamol (ROBAXIN) 500 MG tablet Take 500 mg by mouth.     metoCLOPramide  (REGLAN) 5 MG tablet TAKE 1 TABLET(5 MG) BY MOUTH THREE TIMES DAILY BEFORE MEALS AS NEEDED 60 tablet 2   mirabegron ER (MYRBETRIQ) 25 MG TB24 tablet TAKE 1 TABLET(25 MG) BY MOUTH DAILY 90 tablet 1   nitroGLYCERIN (NITROSTAT) 0.4 MG SL tablet Place 0.4 mg under the tongue every 5 (five) minutes as needed for chest pain.     ondansetron (ZOFRAN) 4 MG tablet TAKE 1 TABLET(4 MG) BY MOUTH EVERY 8 HOURS AS NEEDED FOR NAUSEA OR VOMITING  20 tablet 0   promethazine-dextromethorphan (PROMETHAZINE-DM) 6.25-15 MG/5ML syrup Take 2.5 mLs by mouth 4 (four) times daily as needed for cough. 118 mL 0   RESTASIS 0.05 % ophthalmic emulsion Place 1 drop into both eyes 2 (two) times daily.     rifaximin ([name]) 550 MG TABS tablet Take 1 tablet (550 mg total) by mouth 3 (three) times daily. 42 tablet 0   sucralfate (CARAFATE) 1 g tablet TAKE 1 TABLET(1 GRAM) BY MOUTH FOUR TIMES DAILY AT BEDTIME WITH MEALS 120 tablet 2   topiramate (TOPAMAX) 100 MG tablet TAKE 1 TABLET(100 MG) BY MOUTH TWICE DAILY 180 tablet 0   Vitamin D, Ergocalciferol, (DRISDOL) 1.25 MG (50000 UNIT) CAPS capsule TAKE 1 CAPSULE BY MOUTH EVERY 7 DAYS 8 capsule 0   No facility-administered medications prior to visit.    Review of Systems  Constitutional:  Positive for malaise/fatigue and weight loss. Negative for chills, diaphoresis and fever.  HENT:  Negative for congestion.   Respiratory:  Positive for cough, sputum production, shortness of breath and wheezing. Negative for hemoptysis.   Cardiovascular:  Negative for chest pain, palpitations and leg swelling.     Objective:   Vitals:   03/26/23 0856  BP: 122/64  Pulse: 66  Temp: 97.9 F (36.6 C)  TempSrc: Oral  SpO2: 97%  Weight: 103 lb 12.8 oz (47.1 kg)  Height: 5\' 4"  (1.626 m)   SpO2: 97 % O2 Device: None (Room air)  Physical Exam: General: Thin-appearing, no acute distress HENT: [place], AT Eyes: EOMI, no scleral icterus Respiratory: Clear to auscultation bilaterally.  No  crackles, wheezing or rales Cardiovascular: RRR, -M/R/G, no JVD Extremities:-Edema,-tenderness Neuro: AAO x4, CNII-XII grossly intact Psych: Normal mood, normal affect  Data Reviewed:  Imaging: CT Chest 03/04/23 - thick walled cavitary process in the LLL and lingula, spanning the fissure with a bilobed appearance. Larger lesion measuring 3.4 x 2.1 cm with 1.9 x 1.0 cm lesion.  Right lower lobe tree in bud.  CT A/P 03/18/23 - RML/RML tree in bud and findings consistent with gastroparesis.  PFT: None on file  Labs: CBC    Component Value Date/Time   WBC 6.8 05/02/2022 1518   RBC 4.63 05/02/2022 1518   HGB 13.9 05/02/2022 1518   HGB 13.3 04/13/2021 1544   HCT 42.6 05/02/2022 1518   HCT 40.2 04/13/2021 1544   PLT 372.0 05/02/2022 1518   PLT 369 04/13/2021 1544   MCV 92.0 05/02/2022 1518   MCV 90 04/13/2021 1544   MCH 29.8 04/13/2021 1544   MCH 30.2 02/16/2021 1506   MCHC 32.6 05/02/2022 1518   RDW 14.0 05/02/2022 1518   RDW 13.1 04/13/2021 1544   LYMPHSABS 1.4 05/02/2022 1518   LYMPHSABS 2.0 04/13/2021 1544   MONOABS 0.6 05/02/2022 1518   EOSABS 0.1 05/02/2022 1518   EOSABS 0.1 04/13/2021 1544   BASOSABS 0.2 (H) 05/02/2022 1518   BASOSABS 0.1 04/13/2021 1544        Assessment & Plan:   Discussion: 67 year old female never smoker with gastroparesis, GERD, hiatal hernia, migraine, HTN, allergic rhinitis, deviated septum, hx seizure- well controlled, arthritis who presents with weight loss and incidental cavitary lung lesions in setting of weight loss. Cavitary lesions concerning for malignancy in setting of weight loss and fatigue. May represent infection however absence of fevers and chills less likely. We discussed diagnostic testing including CT-guided biopsy, bronchoscopy and surgery vs conservative management with further imaging. After addressing patient/family's questions, patient wishes to trial antibiotics and  if persistent would want pursue diagnostic testing via  bronchoscopy. Will message interventional pulmonologists on availability to tentative procedure. We discussed risks and benefits of procedure including infection, bleeding and lung collapse. Patient consented to procedure. Coordinated procedure with RN and OR scheduler.   Left cavitary lung lesions START Augmentin  twice a day for 6 weeks ORDER CT Chest without contrast in 4 weeks Schedule for follow-up on August 15-16 Plan for navigational bronchoscopy August 19-23  Shortness of breath, wheezing START Albuterol TWO puffs as needed for shortness of breath or wheezing.  Health Maintenance Immunization History  Administered Date(s) Administered   Fluad Quad(high Dose 65+) 11/09/2021   Influenza,inj,Quad PF,6+ Mos 06/17/2016, 05/17/2017, 06/26/2018, 07/04/2020   PNEUMOCOCCAL CONJUGATE-20 02/18/2023   Tdap 08/05/2013   CT Lung Screen - not qualified  Orders Placed This Encounter  Procedures   CT Chest Wo Contrast    Schedule in 4 weeks (04/23/23)    Standing Status:   Future    Standing Expiration Date:   03/25/2024    Order Specific Question:   Preferred imaging location?    Answer:   [facility]   Meds ordered this encounter  Medications   amoxicillin-clavulanate (AUGMENTIN) 875-125 MG tablet    Sig: Take 1 tablet by mouth 2 (two) times daily.    Dispense:  84 tablet    Refill:  0   albuterol (VENTOLIN HFA) 108 (90 Base) MCG/ACT inhaler    Sig: Inhale 2 puffs into the lungs every 6 (six) hours as needed for wheezing or shortness of breath.    Dispense:  8 g    Refill:  1    No follow-ups on file. After CT scan  I have spent a total time of 45-minutes on the day of the appointment reviewing prior documentation, coordinating care and discussing medical diagnosis and plan with the patient/family. Imaging, labs and tests included in this note have been reviewed and interpreted independently by me.  Elisabetta Mishra Mechele Collin, MD [facility] 03/26/2023 9:01 AM   Office Number [phone]

## 2023-03-26 NOTE — Patient Instructions (Addendum)
Left cavitary lung lesions START Augmentin  twice a day for 6 weeks ORDER CT Chest without contrast in 4 weeks Schedule for follow-up on August 15-16 Plan for navigational bronchoscopy August 19-23

## 2023-03-27 ENCOUNTER — Ambulatory Visit: Payer: 59 | Admitting: Gastroenterology

## 2023-03-27 ENCOUNTER — Encounter: Payer: Self-pay | Admitting: Gastroenterology

## 2023-03-27 VITALS — BP 138/85 | HR 72 | Temp 98.4°F | Resp 16 | Ht 64.0 in | Wt 102.0 lb

## 2023-03-27 DIAGNOSIS — K311 Adult hypertrophic pyloric stenosis: Secondary | ICD-10-CM | POA: Diagnosis not present

## 2023-03-27 DIAGNOSIS — K315 Obstruction of duodenum: Secondary | ICD-10-CM

## 2023-03-27 DIAGNOSIS — K2100 Gastro-esophageal reflux disease with esophagitis, without bleeding: Secondary | ICD-10-CM

## 2023-03-27 DIAGNOSIS — I10 Essential (primary) hypertension: Secondary | ICD-10-CM | POA: Diagnosis not present

## 2023-03-27 MED ORDER — SODIUM CHLORIDE 0.9 % IV SOLN
500.0000 mL | Freq: Once | INTRAVENOUS | Status: DC
Start: 2023-03-27 — End: 2023-03-27

## 2023-03-27 NOTE — Patient Instructions (Addendum)
Resume previous diet (gastroparesis diet).  Continue present medications.  Follow up with Dr. Andrey Campanile in surgical clinic.  YOU HAD AN ENDOSCOPIC PROCEDURE TODAY AT THE [facility]:   Refer to the procedure report that was given to you for any specific questions about what was found during the examination.  If the procedure report does not answer your questions, please call your gastroenterologist to clarify.  If you requested that your care partner not be given the details of your procedure findings, then the procedure report has been included in a sealed envelope for you to review at your convenience later.  YOU SHOULD EXPECT: Some feelings of bloating in the abdomen. Passage of more gas than usual.  Walking can help get rid of the air that was put into your GI tract during the procedure and reduce the bloating. If you had a lower endoscopy (such as a colonoscopy or flexible sigmoidoscopy) you may notice spotting of blood in your stool or on the toilet paper. If you underwent a bowel prep for your procedure, you may not have a normal bowel movement for a few days.  Please Note:  You might notice some irritation and congestion in your nose or some drainage.  This is from the oxygen used during your procedure.  There is no need for concern and it should clear up in a day or so.  SYMPTOMS TO REPORT IMMEDIATELY:   Following upper endoscopy (EGD)  Vomiting of blood or coffee ground material  New chest pain or pain under the shoulder blades  Painful or persistently difficult swallowing  New shortness of breath  Fever of 100F or higher  Black, tarry-looking stools  For urgent or emergent issues, a gastroenterologist can be reached at any hour by calling [phone]. Do not use MyChart messaging for urgent concerns.    DIET:  We do recommend a small meal at first, but then you may proceed to your regular diet.  Drink plenty of fluids but you should avoid alcoholic beverages for 24  hours.  ACTIVITY:  You should plan to take it easy for the rest of today and you should NOT DRIVE or use heavy machinery until tomorrow (because of the sedation medicines used during the test).    FOLLOW UP: Our staff will call the number listed on your records the next business day following your procedure.  We will call around 7:15- 8:00 am to check on you and address any questions or concerns that you may have regarding the information given to you following your procedure. If we do not reach you, we will leave a message.     If any biopsies were taken you will be contacted by phone or by letter within the next 1-3 weeks.  Please call us at [phone] if you have not heard about the biopsies in 3 weeks.    SIGNATURES/CONFIDENTIALITY: You and/or your care partner have signed paperwork which will be entered into your electronic medical record.  These signatures attest to the fact that that the information above on your After Visit Summary has been reviewed and is understood.  Full responsibility of the confidentiality of this discharge information lies with you and/or your care-partner.

## 2023-03-27 NOTE — Progress Notes (Signed)
GASTROENTEROLOGY PROCEDURE H&P NOTE   Primary Care Physician: Sandford Craze, NP    Reason for Procedure:  GERD, hiatal hernia, heartburn, preoperative assessment, duodenal stenosis  Plan:    EGD with dilation and/or biopsies  Patient is appropriate for endoscopic procedure(s) in the ambulatory ([facility]) setting.  The nature of the procedure, as well as the risks, benefits, and alternatives were carefully and thoroughly reviewed with the patient. Ample time for discussion and questions allowed. The patient understood, was satisfied, and agreed to proceed.     HPI: Lindsay Frost is a 67 y.o. female who presents for EGD for evaluation of GERD, hiatal hernia, heartburn, and known duodenal stenosis, along with preoperative evaluation.  Past Medical History:  Diagnosis Date   Acute midline thoracic back pain 02/16/2021   Acute sinusitis 09/05/2014   Allergic rhinitis 07/16/2016   Allergy 2005   Anemia 2021   Angina pectoris (HCC) 07/25/2020   Arthritis    B12 deficiency 01/02/2021   Back pain    arthritis   Chronic sinusitis 12/05/2020   Depression    takes cymbalta daily   Diarrhea 02/16/2021   Duodenal stenosis    Epigastric pain 07/29/2018   Essential hypertension 05/14/2019   Gallstones    Generalized abdominal pain 02/16/2021   GERD (gastroesophageal reflux disease)    takes Omeprazole daily   Headache    Heart murmur    Hiatal hernia    History of gastric ulcer    History of migraine    last one about 2wks ago-takes Imitrex prn   History of seizures    Joint pain    Joint swelling    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   Meningioma (HCC)    Migraine without aura and without status migrainosus, not intractable 01/13/2015   Mixed dyslipidemia 07/25/2020   Myringotomy tube status 02/27/2021   Nausea 02/16/2021   Nocturnal leg cramps 02/20/2015   Obesity (BMI 30-39.9) 07/22/2014   Osteoarthritis of left  knee 12/02/2012   Overactive bladder 01/02/2021   Peptic ulcer disease    Physical exam 07/22/2014   Recurrent acute serous otitis media of left ear 12/05/2020   Seizure-like activity (HCC) 05/12/2014    Past Surgical History:  Procedure Laterality Date   BALLOON DILATION  04/03/2012   Procedure: BALLOON DILATION;  Surgeon: Theda Belfast, MD;  Location: [facility];  Service: Endoscopy;  Laterality: N/A;   BIOPSY  08/24/2019   Procedure: BIOPSY;  Surgeon: Shellia Cleverly, DO;  Location: [facility];  Service: Gastroenterology;;  EGD and COLON   BRAVO PH STUDY N/A 08/24/2019   Procedure: BRAVO PH STUDY;  Surgeon: Shellia Cleverly, DO;  Location: [facility];  Service: Gastroenterology;  Laterality: N/A;   CHOLECYSTECTOMY N/A 04/24/2018   Procedure: LAPAROSCOPIC CHOLECYSTECTOMY;  Surgeon: Berna Bue, MD;  Location: [facility];  Service: General;  Laterality: N/A;   COLONOSCOPY     COLONOSCOPY WITH PROPOFOL N/A 08/24/2019   Procedure: COLONOSCOPY WITH PROPOFOL;  Surgeon: Shellia Cleverly, DO;  Location: [facility];  Service: Gastroenterology;  Laterality: N/A;   DIAGNOSTIC LAPAROSCOPY  15yrs ago   ESOPHAGEAL MANOMETRY N/A 09/04/2015   Procedure: ESOPHAGEAL MANOMETRY (EM);  Surgeon: Jeani Hawking, MD;  Location: [facility];  Service: Endoscopy;  Laterality: N/A;   ESOPHAGEAL MANOMETRY N/A 12/25/2022   Procedure: ESOPHAGEAL MANOMETRY (EM);  Surgeon: Shellia Cleverly, DO;  Location: [facility];  Service: Gastroenterology;  Laterality: N/A;   ESOPHAGOGASTRODUODENOSCOPY  04/03/2012  Procedure: ESOPHAGOGASTRODUODENOSCOPY (EGD);  Surgeon: Theda Belfast, MD;  Location: [facility];  Service: Endoscopy;  Laterality: N/A;  EGD w/ balloon dilation   ESOPHAGOGASTRODUODENOSCOPY (EGD) WITH PROPOFOL N/A 02/18/2014   Procedure: ESOPHAGOGASTRODUODENOSCOPY (EGD) WITH PROPOFOL;  Surgeon: Theda Belfast, MD;  Location: [facility];  Service: Endoscopy;  Laterality: N/A;    ESOPHAGOGASTRODUODENOSCOPY (EGD) WITH PROPOFOL N/A 04/22/2014   Procedure: ESOPHAGOGASTRODUODENOSCOPY (EGD) WITH PROPOFOL;  Surgeon: Theda Belfast, MD;  Location: [facility];  Service: Endoscopy;  Laterality: N/A;   ESOPHAGOGASTRODUODENOSCOPY (EGD) WITH PROPOFOL N/A 08/24/2019   Procedure: ESOPHAGOGASTRODUODENOSCOPY (EGD) WITH PROPOFOL;  Surgeon: Shellia Cleverly, DO;  Location: [facility];  Service: Gastroenterology;  Laterality: N/A;   LEFT HEART CATH AND CORONARY ANGIOGRAPHY N/A 04/18/2021   Procedure: LEFT HEART CATH AND CORONARY ANGIOGRAPHY;  Surgeon: Runell Gess, MD;  Location: [facility];  Service: Cardiovascular;  Laterality: N/A;   NASAL SEPTUM SURGERY  09/2016   PH IMPEDANCE STUDY N/A 09/04/2015   Procedure: PH IMPEDANCE STUDY;  Surgeon: Jeani Hawking, MD;  Location: [facility];  Service: Endoscopy;  Laterality: N/A;   REPLACEMENT TOTAL KNEE Right 28yrs ago   TOTAL KNEE ARTHROPLASTY Left 11/30/2012   Procedure: LEFT TOTAL KNEE ARTHROPLASTY;  Surgeon: Nestor Lewandowsky, MD;  Location: [facility];  Service: Orthopedics;  Laterality: Left;    Prior to Admission medications   Medication Sig Start Date End Date Taking? Authorizing Provider  albuterol (VENTOLIN HFA) 108 (90 Base) MCG/ACT inhaler Inhale 2 puffs into the lungs every 6 (six) hours as needed for wheezing or shortness of breath. 03/26/23   Luciano Cutter, MD  alendronate (FOSAMAX) 70 MG tablet Take 1 tablet (70 mg total) by mouth every 7 (seven) days. Take with a full glass of water on an empty stomach. 07/12/20   Sandford Craze, NP  aluminum-magnesium hydroxide-simethicone (MAALOX) 200-200-20 MG/5ML SUSP Take 30 mLs by mouth daily as needed for indigestion (indigestion).    [provider]  amLODipine (NORVASC) 5 MG tablet TAKE 1 TABLET(5 MG) BY MOUTH DAILY 01/09/23   Sandford Craze, NP  amoxicillin-clavulanate (AUGMENTIN) 875-125 MG tablet Take 1 tablet by mouth 2 (two) times daily. 03/26/23 05/07/23   Luciano Cutter, MD  aspirin EC 81 MG tablet Take 1 tablet (81 mg total) by mouth daily. Swallow whole. 07/25/20   Revankar, Aundra Dubin, MD  Aspirin-Salicylamide-Caffeine (BC HEADACHE PO) Take 1 packet by mouth daily as needed for headache (headaches).    [provider]  atorvastatin (LIPITOR) 20 MG tablet TAKE 1 TABLET(20 MG) BY MOUTH DAILY 01/16/23   Sandford Craze, NP  azelastine (ASTELIN) 0.1 % nasal spray Place 2 sprays into both nostrils 2 (two) times daily. 11/24/19   Wanda Plump, MD  benzonatate (TESSALON) 100 MG capsule Take 1 capsule (100 mg total) by mouth 3 (three) times daily as needed for cough. 02/18/23   Sandford Craze, NP  Bioflavonoid Products (VITAMIN C) CHEW Chew 1 tablet by mouth daily.    [provider]  cholestyramine (QUESTRAN) 4 g packet Take 1 packet (4 g total) by mouth 2 (two) times daily. 07/19/21   Zuma Hust V, DO  dicyclomine (BENTYL) 10 MG capsule Take 1 capsule (10 mg total) by mouth every 6 (six) hours as needed (abdominal cramping/pain). 11/28/22   Kaylor Maiers V, DO  eletriptan (RELPAX) 40 MG tablet TAKE 1 TABLET BY MOUTH AS NEEDED MIGRAINE REPEAT IN 2 HOURS IF SYMPTOMS PERSIST 12/05/22   Drema Dallas, DO  esomeprazole (  NEXIUM) 40 MG capsule Take 1 capsule (40 mg total) by mouth 2 (two) times daily. 12/24/22   Yolando Gillum V, DO  famotidine (PEPCID) 40 MG tablet TAKE 1 TABLET(40 MG) BY MOUTH TWICE DAILY 02/17/23   Davi Kroon V, DO  fluticasone (FLONASE) 50 MCG/ACT nasal spray SHAKE LIQUID AND USE 2 SPRAYS IN EACH NOSTRIL DAILY 12/23/22   Clayborne Dana, NP  methocarbamol (ROBAXIN) 500 MG tablet Take 500 mg by mouth. 07/07/20   [provider]  metoCLOPramide (REGLAN) 5 MG tablet TAKE 1 TABLET(5 MG) BY MOUTH THREE TIMES DAILY BEFORE MEALS AS NEEDED 12/06/22   Darrell Leonhardt V, DO  mirabegron ER (MYRBETRIQ) 25 MG TB24 tablet TAKE 1 TABLET(25 MG) BY MOUTH DAILY 02/18/23   Sandford Craze, NP  nitroGLYCERIN (NITROSTAT)  0.4 MG SL tablet Place 0.4 mg under the tongue every 5 (five) minutes as needed for chest pain.    [provider]  ondansetron (ZOFRAN) 4 MG tablet TAKE 1 TABLET(4 MG) BY MOUTH EVERY 8 HOURS AS NEEDED FOR NAUSEA OR VOMITING 06/10/22   Sandford Craze, NP  promethazine-dextromethorphan (PROMETHAZINE-DM) 6.25-15 MG/5ML syrup Take 2.5 mLs by mouth 4 (four) times daily as needed for cough. 02/24/23   Radford Pax, NP  RESTASIS 0.05 % ophthalmic emulsion Place 1 drop into both eyes 2 (two) times daily. 10/29/22   [provider]  rifaximin ([name]) 550 MG TABS tablet Take 1 tablet (550 mg total) by mouth 3 (three) times daily. 04/02/22   Tanaya Dunigan V, DO  sucralfate (CARAFATE) 1 g tablet TAKE 1 TABLET(1 GRAM) BY MOUTH FOUR TIMES DAILY AT BEDTIME WITH MEALS 02/17/23   Aliene Tamura V, DO  topiramate (TOPAMAX) 100 MG tablet TAKE 1 TABLET(100 MG) BY MOUTH TWICE DAILY 02/25/23   Everlena Cooper, Adam R, DO  Vitamin D, Ergocalciferol, (DRISDOL) 1.25 MG (50000 UNIT) CAPS capsule TAKE 1 CAPSULE BY MOUTH EVERY 7 DAYS 08/21/22   Jamare Vanatta, Verlin Dike, DO    Current Outpatient Medications  Medication Sig Dispense Refill   albuterol (VENTOLIN HFA) 108 (90 Base) MCG/ACT inhaler Inhale 2 puffs into the lungs every 6 (six) hours as needed for wheezing or shortness of breath. 8 g 1   alendronate (FOSAMAX) 70 MG tablet Take 1 tablet (70 mg total) by mouth every 7 (seven) days. Take with a full glass of water on an empty stomach. 4 tablet 11   aluminum-magnesium hydroxide-simethicone (MAALOX) 200-200-20 MG/5ML SUSP Take 30 mLs by mouth daily as needed for indigestion (indigestion).     amLODipine (NORVASC) 5 MG tablet TAKE 1 TABLET(5 MG) BY MOUTH DAILY 90 tablet 0   amoxicillin-clavulanate (AUGMENTIN) 875-125 MG tablet Take 1 tablet by mouth 2 (two) times daily. 84 tablet 0   aspirin EC 81 MG tablet Take 1 tablet (81 mg total) by mouth daily. Swallow whole. 90 tablet 3   Aspirin-Salicylamide-Caffeine (BC  HEADACHE PO) Take 1 packet by mouth daily as needed for headache (headaches).     atorvastatin (LIPITOR) 20 MG tablet TAKE 1 TABLET(20 MG) BY MOUTH DAILY 90 tablet 1   azelastine (ASTELIN) 0.1 % nasal spray Place 2 sprays into both nostrils 2 (two) times daily. 30 mL 1   benzonatate (TESSALON) 100 MG capsule Take 1 capsule (100 mg total) by mouth 3 (three) times daily as needed for cough. 30 capsule 2   Bioflavonoid Products (VITAMIN C) CHEW Chew 1 tablet by mouth daily.     cholestyramine (QUESTRAN) 4 g packet Take 1 packet (4 g total)  by mouth 2 (two) times daily. 60 each 3   dicyclomine (BENTYL) 10 MG capsule Take 1 capsule (10 mg total) by mouth every 6 (six) hours as needed (abdominal cramping/pain). 30 capsule 3   eletriptan (RELPAX) 40 MG tablet TAKE 1 TABLET BY MOUTH AS NEEDED MIGRAINE REPEAT IN 2 HOURS IF SYMPTOMS PERSIST 10 tablet 5   esomeprazole (NEXIUM) 40 MG capsule Take 1 capsule (40 mg total) by mouth 2 (two) times daily. 180 capsule 1   famotidine (PEPCID) 40 MG tablet TAKE 1 TABLET(40 MG) BY MOUTH TWICE DAILY 60 tablet 2   fluticasone (FLONASE) 50 MCG/ACT nasal spray SHAKE LIQUID AND USE 2 SPRAYS IN EACH NOSTRIL DAILY 48 g 3   methocarbamol (ROBAXIN) 500 MG tablet Take 500 mg by mouth.     metoCLOPramide (REGLAN) 5 MG tablet TAKE 1 TABLET(5 MG) BY MOUTH THREE TIMES DAILY BEFORE MEALS AS NEEDED 60 tablet 2   mirabegron ER (MYRBETRIQ) 25 MG TB24 tablet TAKE 1 TABLET(25 MG) BY MOUTH DAILY 90 tablet 1   nitroGLYCERIN (NITROSTAT) 0.4 MG SL tablet Place 0.4 mg under the tongue every 5 (five) minutes as needed for chest pain.     ondansetron (ZOFRAN) 4 MG tablet TAKE 1 TABLET(4 MG) BY MOUTH EVERY 8 HOURS AS NEEDED FOR NAUSEA OR VOMITING 20 tablet 0   promethazine-dextromethorphan (PROMETHAZINE-DM) 6.25-15 MG/5ML syrup Take 2.5 mLs by mouth 4 (four) times daily as needed for cough. 118 mL 0   RESTASIS 0.05 % ophthalmic emulsion Place 1 drop into both eyes 2 (two) times daily.      rifaximin ([name]) 550 MG TABS tablet Take 1 tablet (550 mg total) by mouth 3 (three) times daily. 42 tablet 0   sucralfate (CARAFATE) 1 g tablet TAKE 1 TABLET(1 GRAM) BY MOUTH FOUR TIMES DAILY AT BEDTIME WITH MEALS 120 tablet 2   topiramate (TOPAMAX) 100 MG tablet TAKE 1 TABLET(100 MG) BY MOUTH TWICE DAILY 180 tablet 0   Vitamin D, Ergocalciferol, (DRISDOL) 1.25 MG (50000 UNIT) CAPS capsule TAKE 1 CAPSULE BY MOUTH EVERY 7 DAYS 8 capsule 0   No current facility-administered medications for this visit.    Allergies as of 03/27/2023 - Review Complete 03/26/2023  Allergen Reaction Noted   Naratriptan  07/16/2016    Family History  Problem Relation Age of Onset   Arthritis Mother    Cancer Mother        breast   Heart disease Mother        died from "heart problems" at age 79, had CAD   Depression Mother    Heart disease Father        had CABG   Kidney disease Father    Arthritis Sister    Heart murmur Sister    Arthritis Sister    Aneurysm Sister    Arthritis Brother        "serious back/neck problems"   Healthy Daughter    Colon cancer Neg Hx    Esophageal cancer Neg Hx    Pancreatic cancer Neg Hx    Stomach cancer Neg Hx    Rectal cancer Neg Hx     Social History   Socioeconomic History   Marital status: Widowed    Spouse name: Not on file   Number of children: 1   Years of education: Not on file   Highest education level: Associate degree: occupational, Scientist, product/process development, or vocational program  Occupational History   Occupation: Retired  Tobacco Use   Smoking status: Never  Smokeless tobacco: Never  Vaping Use   Vaping status: Never Used  Substance and Sexual Activity   Alcohol use: No   Drug use: No   Sexual activity: Not Currently    Birth control/protection: Post-menopausal  Other Topics Concern   Not on file  Social History Narrative   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   Single   1 daughter- lives in [place]   2 dogs    Enjoys reading, watching television   Pt is right handed   Drinks coffee once a week, green tea daily, soda sometimes    Social Determinants of Health   Financial Resource Strain: Low Risk  (02/17/2023)   Overall Financial Resource Strain (CARDIA)    Difficulty of Paying Living Expenses: Not hard at all  Food Insecurity: No Food Insecurity (02/17/2023)   Hunger Vital Sign    Worried About Running Out of Food in the Last Year: Never true    Ran Out of Food in the Last Year: Never true  Transportation Needs: No Transportation Needs (02/17/2023)   PRAPARE - Administrator, Civil Service (Medical): No    Lack of Transportation (Non-Medical): No  Physical Activity: Insufficiently Active (02/17/2023)   Exercise Vital Sign    Days of Exercise per Week: 1 day    Minutes of Exercise per Session: 10 min  Stress: No Stress Concern Present (02/17/2023)   [employer] of Occupational Health - Occupational Stress Questionnaire    Feeling of Stress : Only a little  Social Connections: Socially Isolated (02/17/2023)   Social Connection and Isolation Panel [NHANES]    Frequency of Communication with Friends and Family: Once a week    Frequency of Social Gatherings with Friends and Family: Once a week    Attends Religious Services: More than 4 times per year    Active Member of [employer] or Organizations: No    Attends Banker Meetings: Never    Marital Status: Widowed  Intimate Partner Violence: Not At Risk (11/14/2022)   Humiliation, Afraid, Rape, and Kick questionnaire    Fear of Current or Ex-Partner: No    Emotionally Abused: No    Physically Abused: No    Sexually Abused: No    Physical Exam: Vital signs in last 24 hours: @There  were no vitals taken for this visit. GEN: NAD EYE: Sclerae anicteric ENT: MMM CV: Non-tachycardic Pulm: CTA b/l GI: Soft, NT/ND NEURO:  Alert & Oriented x 3   Doristine Locks, DO  Gastroenterology   03/27/2023 1:04 PM

## 2023-03-27 NOTE — Op Note (Signed)
[facility] Patient Name: Lindsay Frost Procedure Date: 03/27/2023 1:51 PM MRN: [id number] Endoscopist: Doristine Locks , MD, [id number] Age: 67 Referring MD:  Date of Birth: 1955/09/23 Gender: Female Account #: [id number] Procedure:                Upper GI endoscopy Indications:              Esophageal reflux, Preoperative assessment Medicines:                Monitored Anesthesia Care Procedure:                Pre-Anesthesia Assessment:                           - Prior to the procedure, a History and Physical                            was performed, and patient medications and                            allergies were reviewed. The patient's tolerance of                            previous anesthesia was also reviewed. The risks                            and benefits of the procedure and the sedation                            options and risks were discussed with the patient.                            All questions were answered, and informed consent                            was obtained. Prior Anticoagulants: The patient has                            taken no anticoagulant or antiplatelet agents. ASA                            Grade Assessment: II - A patient with mild systemic                            disease. After reviewing the risks and benefits,                            the patient was deemed in satisfactory condition to                            undergo the procedure.                           After obtaining informed consent, the endoscope was  passed under direct vision. Throughout the                            procedure, the patient's blood pressure, pulse, and                            oxygen saturations were monitored continuously. The                            Olympus Scope [id number] was introduced through the                            mouth, and advanced to the pylorus. The upper GI                            endoscopy  was accomplished without difficulty. The                            patient tolerated the procedure well. Scope In: Scope Out: Findings:                 The examined esophagus was normal.                           A large amount of food (residue) was found in the                            entire examined stomach. The stomach was J shaped.                            Very limited visualization of the gastric mucosa                            given the degree of retained food and fluid in the                            stomach.                           A severe stenosis was found at the pylorus. This                            was non-traversed. Complications:            No immediate complications. Estimated Blood Loss:     Estimated blood loss: none. Impression:               - Normal esophagus.                           - A large amount of food (residue) in the stomach.                           - Gastric stenosis was found at the pylorus.                           -  No specimens collected. Recommendation:           - Patient has a contact number available for                            emergencies. The signs and symptoms of potential                            delayed complications were discussed with the                            patient. Return to normal activities tomorrow.                            Written discharge instructions were provided to the                            patient.                           - Resume previous diet (gastroparesis diet).                           - Continue present medications.                           - Follow-up with Dr. Andrey Campanile in the surgical clinic. Doristine Locks, MD 03/27/2023 2:18:50 PM

## 2023-03-27 NOTE — Progress Notes (Signed)
Sedate, gd SR, tolerated procedure well, VSS, report to RN 

## 2023-03-28 ENCOUNTER — Telehealth: Payer: Self-pay | Admitting: *Deleted

## 2023-03-28 NOTE — Telephone Encounter (Signed)
  Follow up Call-     03/27/2023    1:08 PM 01/27/2023   12:52 PM  Call back number  Post procedure Call Back phone  # [phone]  Permission to leave phone message Yes Yes     Patient questions:  Do you have a fever, pain , or abdominal swelling? No. Pain Score  0 *  Have you tolerated food without any problems? Yes.    Have you been able to return to your normal activities? Yes.    Do you have any questions about your discharge instructions: Diet   No. Medications  No. Follow up visit  No.  Do you have questions or concerns about your Care? No.  Actions: * If pain score is 4 or above: No action needed, pain <4.

## 2023-03-29 ENCOUNTER — Telehealth: Payer: Self-pay | Admitting: Pulmonary Disease

## 2023-03-29 DIAGNOSIS — J984 Other disorders of lung: Secondary | ICD-10-CM

## 2023-03-29 NOTE — Telephone Encounter (Signed)
Are you available for a navigational bronchoscopy the week of August 19-23?

## 2023-03-29 NOTE — Telephone Encounter (Signed)
Yes - I should be able to do Monday 8/19. Let me know

## 2023-04-01 ENCOUNTER — Ambulatory Visit: Payer: 59 | Admitting: Family

## 2023-04-01 VITALS — BP 138/75 | HR 73 | Temp 98.3°F | Resp 16 | Wt 105.0 lb

## 2023-04-01 DIAGNOSIS — J984 Other disorders of lung: Secondary | ICD-10-CM | POA: Diagnosis not present

## 2023-04-01 DIAGNOSIS — I10 Essential (primary) hypertension: Secondary | ICD-10-CM | POA: Diagnosis not present

## 2023-04-01 DIAGNOSIS — E538 Deficiency of other specified B group vitamins: Secondary | ICD-10-CM

## 2023-04-01 DIAGNOSIS — N3281 Overactive bladder: Secondary | ICD-10-CM

## 2023-04-01 DIAGNOSIS — R634 Abnormal weight loss: Secondary | ICD-10-CM

## 2023-04-01 MED ORDER — CYANOCOBALAMIN 1000 MCG/ML IJ SOLN
1000.0000 ug | Freq: Once | INTRAMUSCULAR | Status: AC
Start: 2023-04-01 — End: 2023-04-01
  Administered 2023-04-01: 1000 ug via INTRAMUSCULAR

## 2023-04-01 NOTE — Assessment & Plan Note (Signed)
Patient has been experiencing weight loss, but has gained 5 pounds since last visit. Gastroparesis is suspected as a contributing factor. A surgical intervention to revise her gastric bypass is being considered by Dr. Andrey Campanile. -Continue monitoring weight. - we discussed that it would be a good idea to get her lung issues stabilized prior to any major surgery.

## 2023-04-01 NOTE — Patient Instructions (Signed)
VISIT SUMMARY:  During your visit, we discussed your lung mass, weight loss, overactive bladder, hypertension, and vitamin B12 deficiency. We have a plan in place for each of these issues, which includes continuing your current medications, monitoring your symptoms, and scheduling follow-up tests and procedures.  YOUR PLAN:  -LUNG MASS: You have an abnormality in your lung that could be an infection or a mass. Pulmonology is treating it with antibiotics and will do a repeat CT scan in four weeks and a procedure to look inside your lungs at the end of August.  -WEIGHT LOSS: Your weight has stabilized. We will continue to monitor.   -OVERACTIVE BLADDER: You have been urinating frequently despite taking medication. We will continue the medication and monitor your symptoms.  -HYPERTENSION: Your blood pressure is well-controlled with your current medication, so we will continue this treatment.  -VITAMIN B12 DEFICIENCY: You are receiving weekly B12 injections to address a deficiency in this vitamin. We will continue these injections, transitioning to monthly after the fourth weekly injection.  INSTRUCTIONS:  Please continue taking your medications as prescribed. We will be doing a repeat CT scan of your chest in four weeks and a procedure to look inside your lungs at the end of August. We will also continue to monitor your weight, bladder symptoms, blood pressure, and vitamin B12 levels. Please return for a follow-up visit in three months, or sooner if any issues arise.

## 2023-04-01 NOTE — Assessment & Plan Note (Signed)
Lung Mass: Abnormal lung imaging concerning for cavitary pneumonia versus mass. This is being managed by pulmonology. Patient is currently on Augmentin BID for six weeks with a plan for repeat CT chest in four weeks and a navigational bronchoscopy at the end of August. Patient is also using Albuterol PRN.

## 2023-04-01 NOTE — Assessment & Plan Note (Signed)
Blood pressure is well-controlled on Amlodipine. -Continue Amlodipine.

## 2023-04-01 NOTE — Assessment & Plan Note (Signed)
Not clear if mybetriq is helping, will continue fo rnow.

## 2023-04-01 NOTE — Assessment & Plan Note (Signed)
Completed 4th weekly injection today- will transition to monthly.

## 2023-04-01 NOTE — Progress Notes (Signed)
Subjective:     Patient ID: Lindsay Frost, female    DOB: May 11, 1956, 67 y.o.   MRN: [id number]  Chief Complaint  Patient presents with   Weight Loss    Here for follow up   Lung Mass    New finding    HPI  Discussed the use of AI scribe software for clinical note transcription with the patient, who gave verbal consent to proceed.  History of Present Illness   The patient, a 67 year old female with a history of gastroparesis, presents for follow-up after abnormal lung imaging suggestive of cavitary pneumonia or a malignant mass. She was started on Augmentin twice daily for six weeks and albuterol as needed. She complete a follow up CT scan in 4 weks with plan for bronchoscopy/biopsy later in August.  The patient expresses anxiety about the lung mass, as one of her doctors suggested it could be cancer based on its size, shape, and weight loss. She has lost approximately thirty pounds this year.   The patient has also been followed for ongoing weight loss, but has gained 5 pounds since the last visit here in early June. Despite feeling hungry, she reports early satiety, which she attributes to her gastroparesis. She is under the care of a gastroenterologist.  She is also working with a bariatric surgeon who is considering a revision to her gastric bypass to help with her malabsorption. However, this plan may be put on hold due to her lung issues.  In addition to these issues, the patient reports frequent urination despite being on Myrbetriq for her bladder.  Although she attributes this to drinking a lot of water.  Denies dysuria. She is also taking amlodipine for blood pressure control and received a B12 injection today.      Wt Readings from Last 3 Encounters:  04/01/23 105 lb (47.6 kg)  03/27/23 102 lb (46.3 kg)  03/26/23 103 lb 12.8 oz (47.1 kg)       Health Maintenance Due  Topic Date Due   Zoster Vaccines- Shingrix (1 of 2) Never done   COVID-19 Vaccine (1 - 2023-24  season) Never done    Past Medical History:  Diagnosis Date   Acute midline thoracic back pain 02/16/2021   Acute sinusitis 09/05/2014   Allergic rhinitis 07/16/2016   Allergy 2005   Anemia 2021   Angina pectoris (HCC) 07/25/2020   Arthritis    B12 deficiency 01/02/2021   Back pain    arthritis   Chronic sinusitis 12/05/2020   Depression    takes cymbalta daily   Diarrhea 02/16/2021   Duodenal stenosis    Epigastric pain 07/29/2018   Essential hypertension 05/14/2019   Gallstones    Generalized abdominal pain 02/16/2021   GERD (gastroesophageal reflux disease)    takes Omeprazole daily   Headache    Heart murmur    Hiatal hernia    History of gastric ulcer    History of migraine    last one about 2wks ago-takes Imitrex prn   History of seizures    Joint pain    Joint swelling    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   Meningioma (HCC)    Migraine without aura and without status migrainosus, not intractable 01/13/2015   Mixed dyslipidemia 07/25/2020   Myringotomy tube status 02/27/2021   Nausea 02/16/2021   Nocturnal leg cramps 02/20/2015   Obesity (BMI 30-39.9) 07/22/2014   Osteoarthritis of left knee 12/02/2012   Overactive bladder 01/02/2021  Peptic ulcer disease    Physical exam 07/22/2014   Recurrent acute serous otitis media of left ear 12/05/2020   Seizure-like activity (HCC) 05/12/2014    Past Surgical History:  Procedure Laterality Date   BALLOON DILATION  04/03/2012   Procedure: BALLOON DILATION;  Surgeon: Theda Belfast, MD;  Location: [facility];  Service: Endoscopy;  Laterality: N/A;   BIOPSY  08/24/2019   Procedure: BIOPSY;  Surgeon: Shellia Cleverly, DO;  Location: [facility];  Service: Gastroenterology;;  EGD and COLON   BRAVO PH STUDY N/A 08/24/2019   Procedure: BRAVO PH STUDY;  Surgeon: Shellia Cleverly, DO;  Location: [facility];  Service: Gastroenterology;  Laterality: N/A;    CHOLECYSTECTOMY N/A 04/24/2018   Procedure: LAPAROSCOPIC CHOLECYSTECTOMY;  Surgeon: Berna Bue, MD;  Location: [facility];  Service: General;  Laterality: N/A;   COLONOSCOPY     COLONOSCOPY WITH PROPOFOL N/A 08/24/2019   Procedure: COLONOSCOPY WITH PROPOFOL;  Surgeon: Shellia Cleverly, DO;  Location: [facility];  Service: Gastroenterology;  Laterality: N/A;   DIAGNOSTIC LAPAROSCOPY  60yrs ago   ESOPHAGEAL MANOMETRY N/A 09/04/2015   Procedure: ESOPHAGEAL MANOMETRY (EM);  Surgeon: Jeani Hawking, MD;  Location: [facility];  Service: Endoscopy;  Laterality: N/A;   ESOPHAGEAL MANOMETRY N/A 12/25/2022   Procedure: ESOPHAGEAL MANOMETRY (EM);  Surgeon: Shellia Cleverly, DO;  Location: [facility];  Service: Gastroenterology;  Laterality: N/A;   ESOPHAGOGASTRODUODENOSCOPY  04/03/2012   Procedure: ESOPHAGOGASTRODUODENOSCOPY (EGD);  Surgeon: Theda Belfast, MD;  Location: [facility];  Service: Endoscopy;  Laterality: N/A;  EGD w/ balloon dilation   ESOPHAGOGASTRODUODENOSCOPY (EGD) WITH PROPOFOL N/A 02/18/2014   Procedure: ESOPHAGOGASTRODUODENOSCOPY (EGD) WITH PROPOFOL;  Surgeon: Theda Belfast, MD;  Location: [facility];  Service: Endoscopy;  Laterality: N/A;   ESOPHAGOGASTRODUODENOSCOPY (EGD) WITH PROPOFOL N/A 04/22/2014   Procedure: ESOPHAGOGASTRODUODENOSCOPY (EGD) WITH PROPOFOL;  Surgeon: Theda Belfast, MD;  Location: [facility];  Service: Endoscopy;  Laterality: N/A;   ESOPHAGOGASTRODUODENOSCOPY (EGD) WITH PROPOFOL N/A 08/24/2019   Procedure: ESOPHAGOGASTRODUODENOSCOPY (EGD) WITH PROPOFOL;  Surgeon: Shellia Cleverly, DO;  Location: [facility];  Service: Gastroenterology;  Laterality: N/A;   LEFT HEART CATH AND CORONARY ANGIOGRAPHY N/A 04/18/2021   Procedure: LEFT HEART CATH AND CORONARY ANGIOGRAPHY;  Surgeon: Runell Gess, MD;  Location: [facility];  Service: Cardiovascular;  Laterality: N/A;   NASAL SEPTUM SURGERY  09/2016   PH IMPEDANCE STUDY N/A 09/04/2015   Procedure: PH  IMPEDANCE STUDY;  Surgeon: Jeani Hawking, MD;  Location: [facility];  Service: Endoscopy;  Laterality: N/A;   REPLACEMENT TOTAL KNEE Right 52yrs ago   TOTAL KNEE ARTHROPLASTY Left 11/30/2012   Procedure: LEFT TOTAL KNEE ARTHROPLASTY;  Surgeon: Nestor Lewandowsky, MD;  Location: [facility];  Service: Orthopedics;  Laterality: Left;    Family History  Problem Relation Age of Onset   Arthritis Mother    Cancer Mother        breast   Heart disease Mother        died from "heart problems" at age 38, had CAD   Depression Mother    Heart disease Father        had CABG   Kidney disease Father    Arthritis Sister    Heart murmur Sister    Arthritis Sister    Aneurysm Sister    Arthritis Brother        "serious back/neck problems"   Healthy Daughter    Colon cancer Neg Hx    Esophageal  cancer Neg Hx    Pancreatic cancer Neg Hx    Stomach cancer Neg Hx    Rectal cancer Neg Hx     Social History   Socioeconomic History   Marital status: Widowed    Spouse name: Not on file   Number of children: 1   Years of education: Not on file   Highest education level: Associate degree: occupational, Scientist, product/process development, or vocational program  Occupational History   Occupation: Retired  Tobacco Use   Smoking status: Never   Smokeless tobacco: Never  Vaping Use   Vaping status: Never Used  Substance and Sexual Activity   Alcohol use: No   Drug use: No   Sexual activity: Not Currently    Birth control/protection: Post-menopausal  Other Topics Concern   Not on file  Social History Narrative   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   Single   1 daughter- lives in [place]   2 dogs   Enjoys reading, watching television   Pt is right handed   Drinks coffee once a week, green tea daily, soda sometimes    Social Determinants of Health   Financial Resource Strain: Low Risk  (02/17/2023)   Overall Financial Resource Strain (CARDIA)    Difficulty of Paying Living Expenses: Not hard  at all  Food Insecurity: No Food Insecurity (02/17/2023)   Hunger Vital Sign    Worried About Running Out of Food in the Last Year: Never true    Ran Out of Food in the Last Year: Never true  Transportation Needs: No Transportation Needs (02/17/2023)   PRAPARE - Administrator, Civil Service (Medical): No    Lack of Transportation (Non-Medical): No  Physical Activity: Insufficiently Active (02/17/2023)   Exercise Vital Sign    Days of Exercise per Week: 1 day    Minutes of Exercise per Session: 10 min  Stress: No Stress Concern Present (02/17/2023)   [employer] of Occupational Health - Occupational Stress Questionnaire    Feeling of Stress : Only a little  Social Connections: Socially Isolated (02/17/2023)   Social Connection and Isolation Panel [NHANES]    Frequency of Communication with Friends and Family: Once a week    Frequency of Social Gatherings with Friends and Family: Once a week    Attends Religious Services: More than 4 times per year    Active Member of [employer] or Organizations: No    Attends Banker Meetings: Never    Marital Status: Widowed  Intimate Partner Violence: Not At Risk (11/14/2022)   Humiliation, Afraid, Rape, and Kick questionnaire    Fear of Current or Ex-Partner: No    Emotionally Abused: No    Physically Abused: No    Sexually Abused: No    Outpatient Medications Prior to Visit  Medication Sig Dispense Refill   albuterol (VENTOLIN HFA) 108 (90 Base) MCG/ACT inhaler Inhale 2 puffs into the lungs every 6 (six) hours as needed for wheezing or shortness of breath. 8 g 1   alendronate (FOSAMAX) 70 MG tablet Take 1 tablet (70 mg total) by mouth every 7 (seven) days. Take with a full glass of water on an empty stomach. 4 tablet 11   aluminum-magnesium hydroxide-simethicone (MAALOX) 200-200-20 MG/5ML SUSP Take 30 mLs by mouth daily as needed for indigestion (indigestion).     amLODipine (NORVASC) 5 MG tablet TAKE 1 TABLET(5 MG) BY MOUTH  DAILY 90 tablet 0   amoxicillin-clavulanate (AUGMENTIN) 875-125 MG tablet  Take 1 tablet by mouth 2 (two) times daily. 84 tablet 0   aspirin EC 81 MG tablet Take 1 tablet (81 mg total) by mouth daily. Swallow whole. 90 tablet 3   Aspirin-Salicylamide-Caffeine (BC HEADACHE PO) Take 1 packet by mouth daily as needed for headache (headaches).     atorvastatin (LIPITOR) 20 MG tablet TAKE 1 TABLET(20 MG) BY MOUTH DAILY 90 tablet 1   azelastine (ASTELIN) 0.1 % nasal spray Place 2 sprays into both nostrils 2 (two) times daily. 30 mL 1   benzonatate (TESSALON) 100 MG capsule Take 1 capsule (100 mg total) by mouth 3 (three) times daily as needed for cough. 30 capsule 2   Bioflavonoid Products (VITAMIN C) CHEW Chew 1 tablet by mouth daily.     cholestyramine (QUESTRAN) 4 g packet Take 1 packet (4 g total) by mouth 2 (two) times daily. 60 each 3   dicyclomine (BENTYL) 10 MG capsule Take 1 capsule (10 mg total) by mouth every 6 (six) hours as needed (abdominal cramping/pain). 30 capsule 3   eletriptan (RELPAX) 40 MG tablet TAKE 1 TABLET BY MOUTH AS NEEDED MIGRAINE REPEAT IN 2 HOURS IF SYMPTOMS PERSIST 10 tablet 5   esomeprazole (NEXIUM) 40 MG capsule Take 1 capsule (40 mg total) by mouth 2 (two) times daily. 180 capsule 1   famotidine (PEPCID) 40 MG tablet TAKE 1 TABLET(40 MG) BY MOUTH TWICE DAILY 60 tablet 2   fluticasone (FLONASE) 50 MCG/ACT nasal spray SHAKE LIQUID AND USE 2 SPRAYS IN EACH NOSTRIL DAILY 48 g 3   methocarbamol (ROBAXIN) 500 MG tablet Take 500 mg by mouth.     metoCLOPramide (REGLAN) 5 MG tablet TAKE 1 TABLET(5 MG) BY MOUTH THREE TIMES DAILY BEFORE MEALS AS NEEDED 60 tablet 2   mirabegron ER (MYRBETRIQ) 25 MG TB24 tablet TAKE 1 TABLET(25 MG) BY MOUTH DAILY 90 tablet 1   nitroGLYCERIN (NITROSTAT) 0.4 MG SL tablet Place 0.4 mg under the tongue every 5 (five) minutes as needed for chest pain.     ondansetron (ZOFRAN) 4 MG tablet TAKE 1 TABLET(4 MG) BY MOUTH EVERY 8 HOURS AS NEEDED FOR NAUSEA OR  VOMITING 20 tablet 0   promethazine-dextromethorphan (PROMETHAZINE-DM) 6.25-15 MG/5ML syrup Take 2.5 mLs by mouth 4 (four) times daily as needed for cough. 118 mL 0   RESTASIS 0.05 % ophthalmic emulsion Place 1 drop into both eyes 2 (two) times daily.     rifaximin ([name]) 550 MG TABS tablet Take 1 tablet (550 mg total) by mouth 3 (three) times daily. 42 tablet 0   sucralfate (CARAFATE) 1 g tablet TAKE 1 TABLET(1 GRAM) BY MOUTH FOUR TIMES DAILY AT BEDTIME WITH MEALS 120 tablet 2   topiramate (TOPAMAX) 100 MG tablet TAKE 1 TABLET(100 MG) BY MOUTH TWICE DAILY 180 tablet 0   Vitamin D, Ergocalciferol, (DRISDOL) 1.25 MG (50000 UNIT) CAPS capsule TAKE 1 CAPSULE BY MOUTH EVERY 7 DAYS 8 capsule 0   No facility-administered medications prior to visit.    Allergies  Allergen Reactions   Naratriptan     Ineffective    ROS See HPI    Objective:    Physical Exam Constitutional:      General: She is not in acute distress.    Appearance: Normal appearance. She is well-developed.  HENT:     Head: Normocephalic and atraumatic.     Right Ear: External ear normal.     Left Ear: External ear normal.  Eyes:     General: No scleral icterus. Neck:  Thyroid: No thyromegaly.  Cardiovascular:     Rate and Rhythm: Normal rate and regular rhythm.     Heart sounds: Normal heart sounds. No murmur heard. Pulmonary:     Effort: Pulmonary effort is normal. No respiratory distress.     Breath sounds: Normal breath sounds. No wheezing.  Musculoskeletal:     Cervical back: Neck supple.  Skin:    General: Skin is warm and dry.  Neurological:     Mental Status: She is alert and oriented to person, place, and time.  Psychiatric:        Mood and Affect: Mood normal.        Behavior: Behavior normal.        Thought Content: Thought content normal.        Judgment: Judgment normal.      BP 138/75 (BP Location: Right Arm, Patient Position: Sitting, Cuff Size: Small)   Pulse 73   Temp 98.3 F  (36.8 C) (Oral)   Resp 16   Wt 105 lb (47.6 kg)   SpO2 100%   BMI 18.02 kg/m  Wt Readings from Last 3 Encounters:  04/01/23 105 lb (47.6 kg)  03/27/23 102 lb (46.3 kg)  03/26/23 103 lb 12.8 oz (47.1 kg)       Assessment & Plan:   Problem List Items Addressed This Visit       Unprioritized   Weight loss     Patient has been experiencing weight loss, but has gained 5 pounds since last visit. Gastroparesis is suspected as a contributing factor. A surgical intervention to revise her gastric bypass is being considered by Dr. Andrey Campanile. -Continue monitoring weight. - we discussed that it would be a good idea to get her lung issues stabilized prior to any major surgery.       Overactive bladder    Not clear if mybetriq is helping, will continue fo rnow.       Hypertension    Blood pressure is well-controlled on Amlodipine. -Continue Amlodipine.       Cavitary lung disease    Lung Mass: Abnormal lung imaging concerning for cavitary pneumonia versus mass. This is being managed by pulmonology. Patient is currently on Augmentin BID for six weeks with a plan for repeat CT chest in four weeks and a navigational bronchoscopy at the end of August. Patient is also using Albuterol PRN.      B12 deficiency - Primary    Completed 4th weekly injection today- will transition to monthly.        I am having Lindsay Frost maintain her Aspirin-Salicylamide-Caffeine (BC HEADACHE PO), azelastine, alendronate, aspirin EC, nitroGLYCERIN, Vitamin C, aluminum-magnesium hydroxide-simethicone, cholestyramine, rifaximin, ondansetron, Vitamin D (Ergocalciferol), dicyclomine, eletriptan, metoCLOPramide, fluticasone, esomeprazole, Restasis, methocarbamol, amLODipine, atorvastatin, sucralfate, famotidine, mirabegron ER, benzonatate, topiramate, promethazine-dextromethorphan, amoxicillin-clavulanate, and albuterol. We administered cyanocobalamin.  Meds ordered this encounter  Medications   cyanocobalamin  (VITAMIN B12) injection 1,000 mcg

## 2023-04-02 NOTE — Telephone Encounter (Signed)
Placed to schedule bronchoscopy on 05/05/2023.  Can be canceled if her subsequent CT scan improves.  She will see Dr. Everardo All prior to the scheduled procedure date.

## 2023-04-08 ENCOUNTER — Other Ambulatory Visit: Payer: Self-pay | Admitting: Family

## 2023-04-16 ENCOUNTER — Encounter (INDEPENDENT_AMBULATORY_CARE_PROVIDER_SITE_OTHER): Payer: Self-pay

## 2023-04-24 ENCOUNTER — Institutional Professional Consult (permissible substitution) (HOSPITAL_BASED_OUTPATIENT_CLINIC_OR_DEPARTMENT_OTHER): Payer: 59 | Admitting: Pulmonary Disease

## 2023-04-25 ENCOUNTER — Ambulatory Visit (HOSPITAL_BASED_OUTPATIENT_CLINIC_OR_DEPARTMENT_OTHER)
Admission: RE | Admit: 2023-04-25 | Discharge: 2023-04-25 | Disposition: A | Discharge status: Home / Self Care | Payer: 59 | Source: Ambulatory Visit | Attending: Pulmonary Disease | Admitting: Pulmonary Disease

## 2023-04-25 DIAGNOSIS — I700 Atherosclerosis of aorta: Secondary | ICD-10-CM | POA: Diagnosis not present

## 2023-04-25 DIAGNOSIS — J984 Other disorders of lung: Secondary | ICD-10-CM | POA: Diagnosis not present

## 2023-04-25 DIAGNOSIS — R918 Other nonspecific abnormal finding of lung field: Secondary | ICD-10-CM | POA: Diagnosis not present

## 2023-04-25 DIAGNOSIS — Q676 Pectus excavatum: Secondary | ICD-10-CM | POA: Diagnosis not present

## 2023-05-01 ENCOUNTER — Encounter (HOSPITAL_BASED_OUTPATIENT_CLINIC_OR_DEPARTMENT_OTHER): Payer: Self-pay | Admitting: Pulmonary Disease

## 2023-05-01 ENCOUNTER — Ambulatory Visit (INDEPENDENT_AMBULATORY_CARE_PROVIDER_SITE_OTHER): Payer: 59 | Admitting: Pulmonary Disease

## 2023-05-01 VITALS — BP 92/60 | HR 68 | Resp 16 | Ht 64.0 in | Wt 104.8 lb

## 2023-05-01 DIAGNOSIS — J984 Other disorders of lung: Secondary | ICD-10-CM

## 2023-05-01 DIAGNOSIS — J42 Unspecified chronic bronchitis: Secondary | ICD-10-CM | POA: Diagnosis not present

## 2023-05-01 NOTE — Patient Instructions (Signed)
Left cavitary lung lesions - resolved Completed antibiotics CANCEL navigational bronchoscopy August 19-23  Shortness of breath, wheezing CONTINUE Albuterol TWO puffs as needed for shortness of breath or wheezing Encouraged regular aerobic activity including 20 min of walking daily
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
## 2023-05-02 ENCOUNTER — Ambulatory Visit: Payer: 59
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
## 2023-05-15 ENCOUNTER — Ambulatory Visit (INDEPENDENT_AMBULATORY_CARE_PROVIDER_SITE_OTHER): Payer: 59 | Admitting: Neurology

## 2023-05-15 ENCOUNTER — Other Ambulatory Visit: Payer: Self-pay | Admitting: Neurology

## 2023-05-15 VITALS — BP 129/73 | HR 87 | Ht 64.0 in | Wt 105.2 lb

## 2023-05-15 DIAGNOSIS — G40209 Localization-related (focal) (partial) symptomatic epilepsy and epileptic syndromes with complex partial seizures, not intractable, without status epilepticus: Secondary | ICD-10-CM

## 2023-05-15 DIAGNOSIS — G43009 Migraine without aura, not intractable, without status migrainosus: Secondary | ICD-10-CM | POA: Diagnosis not present

## 2023-05-15 MED ORDER — ELETRIPTAN HYDROBROMIDE 40 MG PO TABS: 40.0000 mg | ORAL_TABLET | ORAL | 5 refills | Status: DC | PRN

## 2023-05-15 MED ORDER — TOPIRAMATE 100 MG PO TABS: 100.0000 mg | ORAL_TABLET | Freq: Two times a day (BID) | ORAL | 0 refills | Status: DC

## 2023-05-15 MED ORDER — LEVETIRACETAM 500 MG PO TABS: ORAL_TABLET | ORAL | 0 refills | Status: DC

## 2023-05-15 NOTE — Patient Instructions (Signed)
Start levetiracetam 500mg  - take 1 pill twice daily for one week, then increase to 2 pills twice daily. Once you have been on levetiractam 2 pills twice daily for one week, start decreasing topiramate: Take 1/2 tablet in morning and 1 tablet at bedtime for one week Then 1/2 tablet twice daily for one week Then 1/2 tablet at bedtime for one week Then stop Follow up 3 months.

## 2023-05-19 ENCOUNTER — Other Ambulatory Visit: Payer: Self-pay | Admitting: Gastroenterology

## 2023-05-21 ENCOUNTER — Ambulatory Visit: Payer: 59

## 2023-05-29 ENCOUNTER — Ambulatory Visit: Payer: Self-pay | Admitting: General Surgery

## 2023-05-29 DIAGNOSIS — K311 Adult hypertrophic pyloric stenosis: Secondary | ICD-10-CM

## 2023-05-29 NOTE — Progress Notes (Signed)
Surgical Instructions    Your procedure is scheduled on Friday June 06, 2023.  Report to [facility] Main Entrance "A" at 5:30 A.M., then check in with the Admitting office.  Call this number if you have problems the morning of surgery:  [phone]   If you have any questions prior to your surgery date call [phone]: Open Monday-Friday 8am-4pm If you experience any cold or flu symptoms such as cough, fever, chills, shortness of breath, etc. between now and your scheduled surgery, please notify us at the above number     Remember:  Do not eat after midnight the night before your surgery.  You may drink clear liquids until 4:30 the morning of your surgery.   Clear liquids allowed are: Water, Non-Citrus Juices (without pulp), Carbonated Beverages, Clear Tea, Black Coffee ONLY (NO MILK, CREAM OR POWDERED CREAMER of any kind), and Gatorade.    Take these medicines the morning of surgery with A SIP OF WATER:  amLODipine (NORVASC)  atorvastatin (LIPITOR)  esomeprazole (NEXIUM)  eletriptan (RELPAX)  famotidine (PEPCID)  levETIRAcetam (KEPPRA)  mirabegron ER (MYRBETRIQ)  RESTASIS eye drops  topiramate (TOPAMAX)   If needed:  albuterol inhaler: please bring inhaler day of surgery. dicyclomine (BENTYL) fluticasone (FLONASE)  methocarbamol (ROBAXIN)  nitroGLYCERIN (NITROSTAT)    Follow your surgeon's instructions on when to stop Aspirin.  If no instructions were given by your surgeon then you will need to call the office to get those instructions.    As of today, STOP taking any Aspirin (unless otherwise instructed by your surgeon) Aleve, Naproxen, Ibuprofen, Motrin, Advil, Goody's, BC's, all herbal medications, fish oil, and all vitamins.  Special instructions:    Oral Hygiene is also important to reduce your risk of infection.  Remember - BRUSH YOUR TEETH THE MORNING OF SURGERY WITH YOUR REGULAR TOOTHPASTE   [place]- Preparing For Surgery  Before surgery, you can  play an important role. Because skin is not sterile, your skin needs to be as free of germs as possible. You can reduce the number of germs on your skin by washing with CHG (chlorahexidine gluconate) Soap before surgery.  CHG is an antiseptic cleaner which kills germs and bonds with the skin to continue killing germs even after washing.     Please do not use if you have an allergy to CHG or antibacterial soaps. If your skin becomes reddened/irritated stop using the CHG.  Do not shave (including legs and underarms) for at least 48 hours prior to first CHG shower. It is OK to shave your face.  Please follow these instructions carefully.     Shower the NIGHT BEFORE SURGERY and the MORNING OF SURGERY with CHG Soap.   If you chose to wash your hair, wash your hair first as usual with your normal shampoo. After you shampoo, rinse your hair and body thoroughly to remove the shampoo.  Then [employer] and genitals (private parts) with your normal soap and rinse thoroughly to remove soap.  After that Use CHG Soap as you would any other liquid soap. You can apply CHG directly to the skin and wash gently with a scrungie or a clean washcloth.   Apply the CHG Soap to your body ONLY FROM THE NECK DOWN.  Do not use on open wounds or open sores. Avoid contact with your eyes, ears, mouth and genitals (private parts). Wash Face and genitals (private parts)  with your normal soap.   Wash thoroughly, paying special attention to the area where your  surgery will be performed.  Thoroughly rinse your body with warm water from the neck down.  DO NOT shower/wash with your normal soap after using and rinsing off the CHG Soap.  Pat yourself dry with a CLEAN TOWEL.  Wear CLEAN PAJAMAS to bed the night before surgery  Place CLEAN SHEETS on your bed the night before your surgery  DO NOT SLEEP WITH PETS.   Day of Surgery:  Take a shower with CHG soap. Wear Clean/Comfortable clothing the morning of surgery Do not  apply any deodorants/lotions.   Remember to brush your teeth WITH YOUR REGULAR TOOTHPASTE.  Do not wear jewelry or makeup. Do not wear lotions, powders, perfumes/cologne or deodorant. Do not shave 48 hours prior to surgery.  Men may shave face and neck. Do not bring valuables to the hospital. Do not wear nail polish, gel polish, artificial nails, or any other type of covering on natural nails (fingers and toes) If you have artificial nails or gel coating that need to be removed by a nail salon, please have this removed prior to surgery. Artificial nails or gel coating may interfere with anesthesia's ability to adequately monitor your vital signs.  [facility] is not responsible for any belongings or valuables.    Do NOT Smoke (Tobacco/Vaping)  24 hours prior to your procedure  If you use a CPAP at night, you may bring your mask for your overnight stay.   Contacts, glasses, hearing aids, dentures or partials may not be worn into surgery, please bring cases for these belongings   For patients admitted to the hospital, discharge time will be determined by your treatment team.   Patients discharged the day of surgery will not be allowed to drive home, and someone needs to stay with them for 24 hours.   SURGICAL WAITING ROOM VISITATION Patients having surgery or a procedure may have no more than 2 support people in the waiting area - these visitors may rotate.   Children under the age of 36 must have an adult with them who is not the patient. If the patient needs to stay at the hospital during part of their recovery, the visitor guidelines for inpatient rooms apply. Pre-op nurse will coordinate an appropriate time for 1 support person to accompany patient in pre-op.  This support person may not rotate.   Please refer to [email or web address] for the visitor guidelines for Inpatients (after your surgery is over and you are in a regular room).    If you received a COVID test during your pre-op visit, it is requested that you wear a mask when out in public, stay away from anyone that may not be feeling well, and notify your surgeon if you develop symptoms. If you have been in contact with anyone that has tested positive in the last 10 days, please notify your surgeon.    Please read over the following fact sheets that you were given.

## 2023-05-30 ENCOUNTER — Encounter (HOSPITAL_COMMUNITY): Payer: Self-pay

## 2023-05-30 ENCOUNTER — Encounter (HOSPITAL_COMMUNITY)
Admission: RE | Admit: 2023-05-30 | Discharge: 2023-05-30 | Disposition: A | Discharge status: Home / Self Care | Payer: 59 | Source: Ambulatory Visit | Attending: General Surgery | Admitting: General Surgery

## 2023-05-30 ENCOUNTER — Other Ambulatory Visit: Payer: Self-pay

## 2023-05-30 VITALS — BP 141/73 | HR 71 | Temp 98.2°F | Resp 16 | Ht 64.0 in | Wt 106.2 lb

## 2023-05-30 DIAGNOSIS — E782 Mixed hyperlipidemia: Secondary | ICD-10-CM | POA: Diagnosis not present

## 2023-05-30 DIAGNOSIS — K311 Adult hypertrophic pyloric stenosis: Secondary | ICD-10-CM | POA: Insufficient documentation

## 2023-05-30 DIAGNOSIS — Z01818 Encounter for other preprocedural examination: Secondary | ICD-10-CM | POA: Diagnosis not present

## 2023-05-30 DIAGNOSIS — K219 Gastro-esophageal reflux disease without esophagitis: Secondary | ICD-10-CM | POA: Diagnosis not present

## 2023-05-30 DIAGNOSIS — R9431 Abnormal electrocardiogram [ECG] [EKG]: Secondary | ICD-10-CM | POA: Diagnosis not present

## 2023-05-30 DIAGNOSIS — I10 Essential (primary) hypertension: Secondary | ICD-10-CM | POA: Diagnosis not present

## 2023-05-30 LAB — CBC WITH DIFFERENTIAL/PLATELET
Abs Immature Granulocytes: 0.02 10*3/uL (ref 0.00–0.07)
Basophils Absolute: 0.1 10*3/uL (ref 0.0–0.1)
Basophils Relative: 1 %
Eosinophils Absolute: 0.1 10*3/uL (ref 0.0–0.5)
Eosinophils Relative: 1 %
HCT: 43.9 % (ref 36.0–46.0)
Hemoglobin: 13.5 g/dL (ref 12.0–15.0)
Immature Granulocytes: 0 %
Lymphocytes Relative: 20 %
Lymphs Abs: 1.4 10*3/uL (ref 0.7–4.0)
MCH: 29.3 pg (ref 26.0–34.0)
MCHC: 30.8 g/dL (ref 30.0–36.0)
MCV: 95.4 fL (ref 80.0–100.0)
Monocytes Absolute: 0.6 10*3/uL (ref 0.1–1.0)
Monocytes Relative: 9 %
Neutro Abs: 4.8 10*3/uL (ref 1.7–7.7)
Neutrophils Relative %: 69 %
Platelets: 345 10*3/uL (ref 150–400)
RBC: 4.6 MIL/uL (ref 3.87–5.11)
RDW: 14.1 % (ref 11.5–15.5)
WBC: 7 10*3/uL (ref 4.0–10.5)
nRBC: 0 % (ref 0.0–0.2)

## 2023-05-30 LAB — TYPE AND SCREEN
ABO/RH(D): O POS
Antibody Screen: NEGATIVE

## 2023-05-30 LAB — COMPREHENSIVE METABOLIC PANEL
ALT: 11 U/L (ref 0–44)
AST: 18 U/L (ref 15–41)
Albumin: 3.4 g/dL — ABNORMAL LOW (ref 3.5–5.0)
Alkaline Phosphatase: 47 U/L (ref 38–126)
Anion gap: 11 (ref 5–15)
BUN: 11 mg/dL (ref 8–23)
CO2: 20 mmol/L — ABNORMAL LOW (ref 22–32)
Calcium: 8.6 mg/dL — ABNORMAL LOW (ref 8.9–10.3)
Chloride: 108 mmol/L (ref 98–111)
Creatinine, Ser: 0.77 mg/dL (ref 0.44–1.00)
GFR, Estimated: >60 mL/min (ref >60)
Glucose, Bld: 89 mg/dL (ref 70–99)
Potassium: 3.5 mmol/L (ref 3.5–5.1)
Sodium: 139 mmol/L (ref 135–145)
Total Bilirubin: <0.1 mg/dL — ABNORMAL LOW (ref 0.3–1.2)
Total Protein: 6.6 g/dL (ref 6.5–8.1)

## 2023-05-30 LAB — PREALBUMIN: Prealbumin: 32 mg/dL (ref 18–38)

## 2023-05-30 NOTE — Progress Notes (Signed)
Anesthesia PAT APP Evaluation:  Case: [id number] Date/Time: 06/06/23 0715   Procedures:      EXPLORATION LAPAROTOMY     DISTAL GASTRECTOMY     PLACEMENT OF GASTRIC AND JEJUNAL FEEDING TUBES   Anesthesia type: General   Pre-op diagnosis: GASTRIC OUTLET OBSTRUCTION   [facility] ROOM [facility] / [facility]   Surgeons: Gaynelle Adu, MD       DISCUSSION: Patient is a 67 year old female scheduled for the above procedure.  History includes never smoker, HTN, murmur, dyslipidemia, seizures (controlled), migraines, GERD, anemia, meningioma (reported serial imaging that was "stable"; 06/07/14 MRI: "Stable appearance of left parietal parafalcine calcified/ ossified 11.6 mm extra-axial lesion consistent with meningioma"), gastroparesis, PUD, GERD, hiatal hernia, gastroparesis, anemia. She had normal coronaries by 04/2021 [facility].  I evaluated her during her PAT visit due to history of murmur. She had a remote history of an echo in 04/2010 showing no evidence of MVP with mild MR/TR, EF > 55%. She most recently had a cardiac evaluation in 2021-2022 by Dr. Tomie China for chest pain, HTN, HLD. She ultimately underwent cardiac cath on 04/18/21 that showed normal coronary arteries and normal LV function. He chest pain was thought to be noncardiac and may be related to reflux. He did not order an echocardiogram. At her 05/31/21 follow-up visit, Dr. Tomie China wrote, "Patient will now be evaluated for noncardiac chest pain issues such as hiatal hernia. She will be seen in follow-up appointment on a as needed basis only."  She is followed by pulmonologist Dr. Everardo All. Initial consult on 03/26/23 for incidental cavitary lung nodules in setting of weight loss and gastroparesis. She discussed diagnostic testing using as bronchoscopy with biopsy versus conservative management with antibiotics. She opted to antibiotic therapy. At last visit on 05/01/23, she had completed antibiotic course with improvement in dyspnea and wheezing to baseline. She  denied fever, chills. 04/25/23 chest CT showed near complete resolution of the cavitary process within the left lower lobe and lingula from prior exam with resolution of nodular densities, most consistent with resolving infectious process. No indication for bronchoscopy at that time. Return if symptoms worsen or fail to improved.    GI is Dr. Barron Alvine. She had recent EGD and colonoscopy.  EGD 03/27/23 showed normal esophagus, a large amount of food (residue) in the stomach, gastric stenosis was found at the pylorus. Colonoscopy 01/27/23 showed 1 to 2 mm polyp in the ascending colon, removed, sigmoid diverticulosis, nonbleeding internal hemorrhoids, stool in the entire examined colon.  She had delayed gastric emptying by nuclear study on 10/29/22. Given findings and weight loss, the above procedure recommended.   Per PAT evaluation, she denied chest pain, shortness of breath, edema.  She is independent of her ADLs.  She walks her dog for about 20 minutes twice a day which includes inclines.  Any exertional CV symptoms.  She reports rare use of albuterol MDI. On exam, Heart RRR, no murmur noted.  Lungs clear.  No ankle edema or carotid bruits noted. EKG showed SR, LAD.   Anesthesia team to evaluate on the day of surgery.    VS: BP (!) 141/73   Pulse 71   Temp 36.8 C   Resp 16   Ht 5\' 4"  (1.626 m)   Wt 48.2 kg   SpO2 100%   BMI 18.23 kg/m    PROVIDERS: Sandford Craze, NP is PCP Luciano Cutter, MD is pulmonologist Shon Millet, DO is neurologist. Last visit 05/15/23 for seizure and migraine follow-up. Doristine Locks, DO  is GI Revankar, Glean Hess, MD is cardiologist. As needed follow-up recommended at 05/31/21 visit. Christia Reading, MD is ENT   LABS:  Labs reviewed: Acceptable for surgery. (all labs ordered are listed, but only abnormal results are displayed)  Labs Reviewed  COMPREHENSIVE METABOLIC PANEL - Abnormal; Notable for the following components:      Result Value   CO2 20 (*)     Calcium 8.6 (*)    Albumin 3.4 (*)    Total Bilirubin <0.1 (*)    All other components within normal limits  CBC WITH DIFFERENTIAL/PLATELET  PREALBUMIN  TYPE AND SCREEN    IMAGES: CT Chest 04/25/23: IMPRESSION: 1. Near complete resolution of the cavitary process within the left lower lobe and lingula from prior exam. Mild residual bandlike scarring. The adjacent nodular densities have resolved. Findings are most consistent with resolving infectious process. 2. Stable small areas of tree in bud/clustered nodularity in the periphery of the right lower lobe, typically post infectious/inflammatory. 3. Additional small pulmonary nodules are unchanged, largest 5 mm. No specific imaging follow-up is recommended. - Aortic Atherosclerosis (ICD10-I70.0).   CT Abd/pelvis 03/18/23: IMPRESSION: 1. No acute inflammatory process within the abdomen or pelvis. 2. Markedly enlarged stomach extending up to the level of the inferior end of the sacroiliac joints. Large amount of positive oral contrast and probable ingested food material. Findings are compatible with markedly delayed gastric emptying/gastroparesis. 3. Several small groupings of tree-in-bud configuration nodules in the imaged middle lobe and right lower lobe. These are nonspecific but commonly seen as sequela of small airway disease. 4. Multiple other nonacute observations, as described above.   NM Gastric Emptying Study 10/29/22: FINDINGS: Expected location of the stomach in the left upper quadrant. Ingested meal empties the stomach gradually over the course of the study.   1% emptied at 1 hr ( normal >= 10%) 3% emptied at 2 hr ( normal >= 40%) 4% emptied at 3 hr ( normal >= 70%) 5% emptied at 4 hr ( normal >= 90%)   IMPRESSION: Scintigraphic findings compatible with delayed gastric emptying.      EKG: EKG 05/30/23:  Normal sinus rhythm Left axis deviation Abnormal ECG When compared with ECG of 20-Apr-2018 13:42, Left  axis deviation is now present Confirmed by Donato Schultz ([id number]) on 05/31/2023 7:25:11 AM   CV: Cardiac cath 04/18/21:   The left ventricular systolic function is normal.   LV end diastolic pressure is normal.   The left ventricular ejection fraction is 55-65% by visual estimate. IMPRESSION: Ms. Leggio has normal coronary arteries and normal LV function.  Her chest pain is noncardiac and may be related to reflux.    Echo 05/04/10 (scanned under Media tab): Summary: Technically difficult study secondary to obesity. There is mild concentric left ventricular hypertrophy. Ejection fraction > 55%. Left ventricular systolic function is normal. The transmitral spectral Doppler flow pattern is normal for age. The left atrial size is normal. The interatrial septum is intact with no evidence for an atrial septal defect. Injection of contrast documented no interatrial shunt. Right ventricular systolic function is normal. No significant valvular disease.  There is no evidence of mitral valve prolapse.  There is mild mitral regurgitation.  There is mild tricuspid regurgitation.  RVSP is normal.   Past Medical History:  Diagnosis Date   Acute midline thoracic back pain 02/16/2021   Acute sinusitis 09/05/2014   Allergic rhinitis 07/16/2016   Allergy 2005   Anemia 2021   Arthritis    B12 deficiency 01/02/2021  Back pain    arthritis   Chronic sinusitis 12/05/2020   Depression    takes cymbalta daily   Diarrhea 02/16/2021   Duodenal stenosis    Epigastric pain 07/29/2018   Essential hypertension 05/14/2019   Gallstones    Generalized abdominal pain 02/16/2021   GERD (gastroesophageal reflux disease)    takes Omeprazole daily   Headache    Heart murmur    Hiatal hernia    History of gastric ulcer    History of migraine    last one about 2wks ago-takes Imitrex prn   History of seizures    Joint pain    Joint swelling    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   Meningioma (HCC)    Migraine without aura and without status migrainosus, not intractable 01/13/2015   Mixed dyslipidemia 07/25/2020   Myringotomy tube status 02/27/2021   Nausea 02/16/2021   Nocturnal leg cramps 02/20/2015   Obesity (BMI 30-39.9) 07/22/2014   Osteoarthritis of left knee 12/02/2012   Overactive bladder 01/02/2021   Peptic ulcer disease    Physical exam 07/22/2014   Recurrent acute serous otitis media of left ear 12/05/2020   Seizure-like activity (HCC) 05/12/2014    Past Surgical History:  Procedure Laterality Date   BALLOON DILATION  04/03/2012   Procedure: BALLOON DILATION;  Surgeon: Theda Belfast, MD;  Location: [facility];  Service: Endoscopy;  Laterality: N/A;   BIOPSY  08/24/2019   Procedure: BIOPSY;  Surgeon: Shellia Cleverly, DO;  Location: [facility];  Service: Gastroenterology;;  EGD and COLON   BRAVO PH STUDY N/A 08/24/2019   Procedure: BRAVO PH STUDY;  Surgeon: Shellia Cleverly, DO;  Location: [facility];  Service: Gastroenterology;  Laterality: N/A;   CHOLECYSTECTOMY N/A 04/24/2018   Procedure: LAPAROSCOPIC CHOLECYSTECTOMY;  Surgeon: Berna Bue, MD;  Location: [facility] ORS;  Service: General;  Laterality: N/A;   COLONOSCOPY     COLONOSCOPY WITH PROPOFOL N/A 08/24/2019   Procedure: COLONOSCOPY WITH PROPOFOL;  Surgeon: Shellia Cleverly, DO;  Location: [facility];  Service: Gastroenterology;  Laterality: N/A;   DIAGNOSTIC LAPAROSCOPY  69yrs ago   ESOPHAGEAL MANOMETRY N/A 09/04/2015   Procedure: ESOPHAGEAL MANOMETRY (EM);  Surgeon: Jeani Hawking, MD;  Location: [facility];  Service: Endoscopy;  Laterality: N/A;   ESOPHAGEAL MANOMETRY N/A 12/25/2022   Procedure: ESOPHAGEAL MANOMETRY (EM);  Surgeon: Shellia Cleverly, DO;  Location: [facility];  Service: Gastroenterology;  Laterality: N/A;   ESOPHAGOGASTRODUODENOSCOPY  04/03/2012   Procedure: ESOPHAGOGASTRODUODENOSCOPY (EGD);  Surgeon: Theda Belfast, MD;   Location: [facility];  Service: Endoscopy;  Laterality: N/A;  EGD w/ balloon dilation   ESOPHAGOGASTRODUODENOSCOPY (EGD) WITH PROPOFOL N/A 02/18/2014   Procedure: ESOPHAGOGASTRODUODENOSCOPY (EGD) WITH PROPOFOL;  Surgeon: Theda Belfast, MD;  [facility];  Service: Endoscopy;  Laterality: N/A;   ESOPHAGOGASTRODUODENOSCOPY (EGD) WITH PROPOFOL N/A 04/22/2014   Procedure: ESOPHAGOGASTRODUODENOSCOPY (EGD) WITH PROPOFOL;  Surgeon: Theda Belfast, MD;  [facility];  Service: Endoscopy;  Laterality: N/A;   ESOPHAGOGASTRODUODENOSCOPY (EGD) WITH PROPOFOL N/A 08/24/2019   Procedure: ESOPHAGOGASTRODUODENOSCOPY (EGD) WITH PROPOFOL;  Surgeon: Shellia Cleverly, DO;  Location: [facility];  Service: Gastroenterology;  Laterality: N/A;   EYE SURGERY Bilateral 2021   LEFT HEART CATH AND CORONARY ANGIOGRAPHY N/A 04/18/2021   Procedure: LEFT HEART CATH AND CORONARY ANGIOGRAPHY;  Surgeon: Runell Gess, MD;  [facility]: [facility];  Service: Cardiovascular;  Laterality: N/A;   NASAL SEPTUM SURGERY  09/2016  PH IMPEDANCE STUDY N/A 09/04/2015   Procedure: PH IMPEDANCE STUDY;  Surgeon: Jeani Hawking, MD;  Location: [facility];  Service: Endoscopy;  Laterality: N/A;   REPLACEMENT TOTAL KNEE Right 89yrs ago   TOTAL KNEE ARTHROPLASTY Left 11/30/2012   Procedure: LEFT TOTAL KNEE ARTHROPLASTY;  Surgeon: Nestor Lewandowsky, MD;  Location: [facility];  Service: Orthopedics;  Laterality: Left;    MEDICATIONS:  albuterol (VENTOLIN HFA) 108 (90 Base) MCG/ACT inhaler   alendronate (FOSAMAX) 70 MG tablet   aluminum-magnesium hydroxide-simethicone (MAALOX) 200-200-20 MG/5ML SUSP   amLODipine (NORVASC) 5 MG tablet   aspirin EC 81 MG tablet   Aspirin-Salicylamide-Caffeine (BC HEADACHE PO)   atorvastatin (LIPITOR) 20 MG tablet   azelastine (ASTELIN) 0.1 % nasal spray   cholestyramine (QUESTRAN) 4 g packet   dicyclomine (BENTYL) 10 MG capsule   eletriptan (RELPAX) 40 MG tablet   esomeprazole  (NEXIUM) 40 MG capsule   famotidine (PEPCID) 40 MG tablet   fluticasone (FLONASE) 50 MCG/ACT nasal spray   levETIRAcetam (KEPPRA) 500 MG tablet   methocarbamol (ROBAXIN) 500 MG tablet   metoCLOPramide (REGLAN) 5 MG tablet   mirabegron ER (MYRBETRIQ) 25 MG TB24 tablet   nitroGLYCERIN (NITROSTAT) 0.4 MG SL tablet   RESTASIS 0.05 % ophthalmic emulsion   rifaximin ([name]) 550 MG TABS tablet   sucralfate (CARAFATE) 1 g tablet   topiramate (TOPAMAX) 100 MG tablet   No current facility-administered medications for this encounter.   Last ASA 05/30/23.   Shonna Chock, PA-C Surgical Short Stay/Anesthesiology [facility] Phone [phone] [facility] Phone [phone] 06/02/2023 2:50 PM

## 2023-05-30 NOTE — Progress Notes (Signed)
Surgical Instructions                 Your procedure is scheduled on Friday June 06, 2023.             Report to [facility] Main Entrance "A" at 5:30 A.M., then check in with the Admitting office.             Call this number if you have problems the morning of surgery:             [phone]    If you have any questions prior to your surgery date call [phone]: Open Monday-Friday 8am-4pm If you experience any cold or flu symptoms such as cough, fever, chills, shortness of breath, etc. between now and your scheduled surgery, please notify us at the above number                  Remember:             Do not eat after midnight the night before your surgery.   You may drink clear liquids until 4:30 the morning of your surgery.   Clear liquids allowed are: Water, Non-Citrus Juices (without pulp), Carbonated Beverages, Clear Tea, Black Coffee ONLY (NO MILK, CREAM OR POWDERED CREAMER of any kind), and Gatorade.  Patient Instructions  The night before surgery:  No food after midnight. ONLY clear liquids after midnight  The day of surgery (if you do NOT have diabetes):  Drink ONE (1) Pre-Surgery Clear Ensure by 0430 the morning of surgery. Drink in one sitting. Do not sip.  This drink was given to you during your hospital  pre-op appointment visit.  Nothing else to drink after completing the  Pre-Surgery Clear Ensure.                           Take these medicines the morning of surgery with A SIP OF WATER:                            amLODipine (NORVASC)               atorvastatin (LIPITOR)               esomeprazole (NEXIUM)               eletriptan (RELPAX)               famotidine (PEPCID)               levETIRAcetam (KEPPRA)               mirabegron ER (MYRBETRIQ)               RESTASIS eye drops               topiramate (TOPAMAX)                  If needed:                albuterol inhaler: please bring inhaler day of surgery.              dicyclomine (BENTYL)               fluticasone (FLONASE)               methocarbamol (ROBAXIN)               nitroGLYCERIN (NITROSTAT)  Follow your surgeon's instructions on when to stop Aspirin.  If no instructions were given by your surgeon then you will need to call the office to get those instructions.     As of today, STOP taking any Aspirin (unless otherwise instructed by your surgeon) Aleve, Naproxen, Ibuprofen, Motrin, Advil, Goody's, BC's, all herbal medications, fish oil, and all vitamins.   Special instructions:     Oral Hygiene is also important to reduce your risk of infection.  Remember - BRUSH YOUR TEETH THE MORNING OF SURGERY WITH YOUR REGULAR TOOTHPASTE     [place]- Preparing For Surgery   Before surgery, you can play an important role. Because skin is not sterile, your skin needs to be as free of germs as possible. You can reduce the number of germs on your skin by washing with CHG (chlorahexidine gluconate) Soap before surgery.  CHG is an antiseptic cleaner which kills germs and bonds with the skin to continue killing germs even after washing.       Please do not use if you have an allergy to CHG or antibacterial soaps. If your skin becomes reddened/irritated stop using the CHG.  Do not shave (including legs and underarms) for at least 48 hours prior to first CHG shower. It is OK to shave your face.   Please follow these instructions carefully.                                                                                                                                  Shower the NIGHT BEFORE SURGERY and the MORNING OF SURGERY with CHG Soap.  If you chose to wash your hair, wash your hair first as usual with your normal shampoo. After you shampoo, rinse your hair and body thoroughly to remove the shampoo.  Then [employer] and genitals (private parts) with your normal soap and rinse thoroughly to remove soap.   After that Use CHG Soap as you would any other liquid soap. You can apply  CHG directly to the skin and wash gently with a scrungie or a clean washcloth.    Apply the CHG Soap to your body ONLY FROM THE NECK DOWN.  Do not use on open wounds or open sores. Avoid contact with your eyes, ears, mouth and genitals (private parts). Wash Face and genitals (private parts)  with your normal soap.    Wash thoroughly, paying special attention to the area where your surgery will be performed.   Thoroughly rinse your body with warm water from the neck down.   DO NOT shower/wash with your normal soap after using and rinsing off the CHG Soap.   Pat yourself dry with a CLEAN TOWEL.   Wear CLEAN PAJAMAS to bed the night before surgery   Place CLEAN SHEETS on your bed the night before your surgery   DO NOT SLEEP WITH PETS.     Day of Surgery:   Take a  shower with CHG soap. Wear Clean/Comfortable clothing the morning of surgery Do not apply any deodorants/lotions.   Remember to brush your teeth WITH YOUR REGULAR TOOTHPASTE.   Do not wear jewelry or makeup. Do not wear lotions, powders, perfumes/cologne or deodorant. Do not shave 48 hours prior to surgery.  Men may shave face and neck. Do not bring valuables to the hospital. Do not wear nail polish, gel polish, artificial nails, or any other type of covering on natural nails (fingers and toes) If you have artificial nails or gel coating that need to be removed by a nail salon, please have this removed prior to surgery. Artificial nails or gel coating may interfere with anesthesia's ability to adequately monitor your vital signs.   [facility] is not responsible for any belongings or valuables.     Do NOT Smoke (Tobacco/Vaping)  24 hours prior to your procedure   If you use a CPAP at night, you may bring your mask for your overnight stay.   Contacts, glasses, hearing aids, dentures or partials may not be worn into surgery, please bring cases for these belongings   For patients admitted to the hospital, discharge time  will be determined by your treatment team.   Patients discharged the day of surgery will not be allowed to drive home, and someone needs to stay with them for 24 hours.     SURGICAL WAITING ROOM VISITATION Patients having surgery or a procedure may have no more than 2 support people in the waiting area - these visitors may rotate.   Children under the age of 85 must have an adult with them who is not the patient. If the patient needs to stay at the hospital during part of their recovery, the visitor guidelines for inpatient rooms apply. Pre-op nurse will coordinate an appropriate time for 1 support person to accompany patient in pre-op.  This support person may not rotate.    Please refer to [email or web address] for the visitor guidelines for Inpatients (after your surgery is over and you are in a regular room).    If you received a COVID test during your pre-op visit, it is requested that you wear a mask when out in public, stay away from anyone that may not be feeling well, and notify your surgeon if you develop symptoms. If you have been in contact with anyone that has tested positive in the last 10 days, please notify your surgeon.     Please read over the following fact sheets that you were given.

## 2023-05-30 NOTE — Progress Notes (Addendum)
PCP - Sandford Craze, NP Cardiologist - denies; states not seen one since cardiac cath in 2022 Pulmonologist -Luciano Cutter, MD (visit on 05/01/2023 for bronchitis)  PPM/ICD - denies Device Orders - n/a Rep Notified - n/a  Chest x-ray - 04/25/2023 EKG - 05/30/2023 Stress Test - denies ECHO - 2011 Cardiac Cath - 04/18/2021  Sleep Study - denies CPAP - n/a  Fasting Blood Sugar - no DM Checks Blood Sugar _____ times a day  Last dose of GLP1 agonist-  n/a GLP1 instructions:   Blood Thinner Instructions: n/a Aspirin Instructions:follow your surgeon's instructions on when to stop Aspirin.  If no instructions were given by your surgeon then you will need to call the office to get those instructions.     ERAS Protcol - yes till 0430 am  PRE-SURGERY Ensure or G2- Ensure  COVID TEST- denies   Anesthesia review: yes; hx of heart murmur and cardiac cath in 2022. Shonna Chock, PA came to assess the pt.    Patient denies shortness of breath, fever, cough and chest pain at PAT appointment   All instructions explained to the patient, with a verbal understanding of the material. Patient agrees to go over the instructions while at home for a better understanding. Patient also instructed to self quarantine after being tested for COVID-19. The opportunity to ask questions was provided.

## 2023-06-02 ENCOUNTER — Encounter (HOSPITAL_COMMUNITY): Payer: Self-pay

## 2023-06-02 NOTE — Anesthesia Preprocedure Evaluation (Addendum)
Anesthesia Evaluation  Patient identified by MRN, date of birth, ID band Patient awake    Reviewed: Allergy & Precautions, NPO status , Patient's Chart, lab work & pertinent test results  History of Anesthesia Complications Negative for: history of anesthetic complications  Airway Mallampati: II  TM Distance: >3 FB Neck ROM: Full    Dental  (+) Dental Advisory Given   Pulmonary neg pulmonary ROS   Pulmonary exam normal        Cardiovascular hypertension, Pt. on medications + CAD  Normal cardiovascular exam     Neuro/Psych  Headaches, Seizures -, Well Controlled,  PSYCHIATRIC DISORDERS  Depression     Neuromuscular disease    GI/Hepatic Neg liver ROS, hiatal hernia, PUD,GERD  Medicated and Controlled,, Gastric outlet obstruction    Endo/Other  negative endocrine ROS    Renal/GU negative Renal ROS  Female GU complaint     Musculoskeletal  (+) Arthritis ,    Abdominal   Peds  Hematology negative hematology ROS (+)   Anesthesia Other Findings   Reproductive/Obstetrics                             Anesthesia Physical Anesthesia Plan  ASA: 3  Anesthesia Plan: General   Post-op Pain Management: Ofirmev IV (intra-op)*, Dilaudid IV and Ketamine IV*   Induction: Intravenous and Rapid sequence  PONV Risk Score and Plan: 3 and Treatment may vary due to age or medical condition, Ondansetron, Dexamethasone and Midazolam  Airway Management Planned: Oral ETT  Additional Equipment: None  Intra-op Plan:   Post-operative Plan: Extubation in OR  Informed Consent: I have reviewed the patients History and Physical, chart, labs and discussed the procedure including the risks, benefits and alternatives for the proposed anesthesia with the patient or authorized representative who has indicated his/her understanding and acceptance.     Dental advisory given  Plan Discussed with: CRNA and  Anesthesiologist  Anesthesia Plan Comments: (See PAT note  )       Anesthesia Quick Evaluation

## 2023-06-06 ENCOUNTER — Other Ambulatory Visit: Payer: Self-pay

## 2023-06-06 ENCOUNTER — Inpatient Hospital Stay (HOSPITAL_COMMUNITY): Payer: 59

## 2023-06-06 ENCOUNTER — Encounter (HOSPITAL_COMMUNITY): Admission: RE | Disposition: A | Discharge status: Home Health | Payer: Self-pay | Source: Home / Self Care

## 2023-06-06 ENCOUNTER — Inpatient Hospital Stay (HOSPITAL_COMMUNITY): Payer: 59 | Admitting: Vascular Surgery

## 2023-06-06 ENCOUNTER — Inpatient Hospital Stay (HOSPITAL_COMMUNITY)
Admission: RE | Admit: 2023-06-06 | Discharge: 2023-07-11 | DRG: 326 | Disposition: A | Discharge status: Home Health | Payer: 59 | Attending: General Surgery | Admitting: General Surgery

## 2023-06-06 ENCOUNTER — Encounter (HOSPITAL_COMMUNITY): Payer: Self-pay | Admitting: General Surgery

## 2023-06-06 DIAGNOSIS — K432 Incisional hernia without obstruction or gangrene: Secondary | ICD-10-CM | POA: Diagnosis present

## 2023-06-06 DIAGNOSIS — R109 Unspecified abdominal pain: Secondary | ICD-10-CM | POA: Diagnosis present

## 2023-06-06 DIAGNOSIS — R5381 Other malaise: Secondary | ICD-10-CM | POA: Diagnosis present

## 2023-06-06 DIAGNOSIS — K562 Volvulus: Secondary | ICD-10-CM | POA: Diagnosis not present

## 2023-06-06 DIAGNOSIS — I2510 Atherosclerotic heart disease of native coronary artery without angina pectoris: Secondary | ICD-10-CM

## 2023-06-06 DIAGNOSIS — K660 Peritoneal adhesions (postprocedural) (postinfection): Secondary | ICD-10-CM | POA: Diagnosis not present

## 2023-06-06 DIAGNOSIS — K277 Chronic peptic ulcer, site unspecified, without hemorrhage or perforation: Secondary | ICD-10-CM | POA: Diagnosis not present

## 2023-06-06 DIAGNOSIS — Z931 Gastrostomy status: Secondary | ICD-10-CM | POA: Diagnosis not present

## 2023-06-06 DIAGNOSIS — K311 Adult hypertrophic pyloric stenosis: Secondary | ICD-10-CM | POA: Diagnosis not present

## 2023-06-06 DIAGNOSIS — R3911 Hesitancy of micturition: Secondary | ICD-10-CM | POA: Diagnosis present

## 2023-06-06 DIAGNOSIS — I10 Essential (primary) hypertension: Secondary | ICD-10-CM | POA: Diagnosis not present

## 2023-06-06 DIAGNOSIS — Z96653 Presence of artificial knee joint, bilateral: Secondary | ICD-10-CM | POA: Diagnosis not present

## 2023-06-06 DIAGNOSIS — Z903 Acquired absence of stomach [part of]: Secondary | ICD-10-CM | POA: Diagnosis not present

## 2023-06-06 DIAGNOSIS — E782 Mixed hyperlipidemia: Secondary | ICD-10-CM | POA: Diagnosis not present

## 2023-06-06 DIAGNOSIS — Z86011 Personal history of benign neoplasm of the brain: Secondary | ICD-10-CM

## 2023-06-06 DIAGNOSIS — E785 Hyperlipidemia, unspecified: Secondary | ICD-10-CM

## 2023-06-06 DIAGNOSIS — E43 Unspecified severe protein-calorie malnutrition: Secondary | ICD-10-CM | POA: Diagnosis not present

## 2023-06-06 DIAGNOSIS — Z818 Family history of other mental and behavioral disorders: Secondary | ICD-10-CM

## 2023-06-06 DIAGNOSIS — Y848 Other medical procedures as the cause of abnormal reaction of the patient, or of later complication, without mention of misadventure at the time of the procedure: Secondary | ICD-10-CM | POA: Diagnosis not present

## 2023-06-06 DIAGNOSIS — Z841 Family history of disorders of kidney and ureter: Secondary | ICD-10-CM

## 2023-06-06 DIAGNOSIS — R1110 Vomiting, unspecified: Secondary | ICD-10-CM | POA: Diagnosis not present

## 2023-06-06 DIAGNOSIS — K9423 Gastrostomy malfunction: Secondary | ICD-10-CM | POA: Diagnosis not present

## 2023-06-06 DIAGNOSIS — Z681 Body mass index (BMI) 19 or less, adult: Secondary | ICD-10-CM

## 2023-06-06 DIAGNOSIS — K3184 Gastroparesis: Secondary | ICD-10-CM | POA: Diagnosis present

## 2023-06-06 DIAGNOSIS — K5669 Other partial intestinal obstruction: Secondary | ICD-10-CM | POA: Diagnosis not present

## 2023-06-06 DIAGNOSIS — K2100 Gastro-esophageal reflux disease with esophagitis, without bleeding: Secondary | ICD-10-CM | POA: Diagnosis present

## 2023-06-06 DIAGNOSIS — G40209 Localization-related (focal) (partial) symptomatic epilepsy and epileptic syndromes with complex partial seizures, not intractable, without status epilepticus: Secondary | ICD-10-CM | POA: Diagnosis present

## 2023-06-06 DIAGNOSIS — K9429 Other complications of gastrostomy: Secondary | ICD-10-CM | POA: Diagnosis not present

## 2023-06-06 DIAGNOSIS — K279 Peptic ulcer, site unspecified, unspecified as acute or chronic, without hemorrhage or perforation: Secondary | ICD-10-CM | POA: Diagnosis not present

## 2023-06-06 DIAGNOSIS — F419 Anxiety disorder, unspecified: Secondary | ICD-10-CM | POA: Diagnosis present

## 2023-06-06 DIAGNOSIS — R0602 Shortness of breath: Secondary | ICD-10-CM | POA: Diagnosis not present

## 2023-06-06 DIAGNOSIS — Z8261 Family history of arthritis: Secondary | ICD-10-CM | POA: Diagnosis not present

## 2023-06-06 DIAGNOSIS — N281 Cyst of kidney, acquired: Secondary | ICD-10-CM | POA: Diagnosis not present

## 2023-06-06 DIAGNOSIS — N3289 Other specified disorders of bladder: Secondary | ICD-10-CM | POA: Diagnosis not present

## 2023-06-06 DIAGNOSIS — Z8249 Family history of ischemic heart disease and other diseases of the circulatory system: Secondary | ICD-10-CM | POA: Diagnosis not present

## 2023-06-06 DIAGNOSIS — G8929 Other chronic pain: Secondary | ICD-10-CM | POA: Diagnosis not present

## 2023-06-06 DIAGNOSIS — K449 Diaphragmatic hernia without obstruction or gangrene: Secondary | ICD-10-CM | POA: Diagnosis present

## 2023-06-06 DIAGNOSIS — K219 Gastro-esophageal reflux disease without esophagitis: Secondary | ICD-10-CM | POA: Diagnosis not present

## 2023-06-06 DIAGNOSIS — I25119 Atherosclerotic heart disease of native coronary artery with unspecified angina pectoris: Secondary | ICD-10-CM | POA: Diagnosis not present

## 2023-06-06 DIAGNOSIS — Z7409 Other reduced mobility: Secondary | ICD-10-CM | POA: Diagnosis not present

## 2023-06-06 HISTORY — PX: GASTRECTOMY: SHX58

## 2023-06-06 HISTORY — PX: LAPAROTOMY: SHX154

## 2023-06-06 HISTORY — PX: GASTROSTOMY: SHX5249

## 2023-06-06 SURGERY — LAPAROTOMY, EXPLORATORY
Anesthesia: General | Site: Abdomen

## 2023-06-06 MED ORDER — CHLORHEXIDINE GLUCONATE 0.12 % MT SOLN
15.0000 mL | Freq: Once | OROMUCOSAL | Status: AC
  Administered 2023-06-06: 15 mL via OROMUCOSAL
  Filled 2023-06-06: qty 15

## 2023-06-06 MED ORDER — OXYCODONE HCL 5 MG PO TABS: 5.0000 mg | ORAL_TABLET | Freq: Once | ORAL | Status: DC | PRN

## 2023-06-06 MED ORDER — ONDANSETRON HCL 4 MG/2ML IJ SOLN
4.0000 mg | Freq: Once | INTRAMUSCULAR | Status: AC | PRN
  Administered 2023-06-06: 4 mg via INTRAVENOUS

## 2023-06-06 MED ORDER — ORAL CARE MOUTH RINSE: 15.0000 mL | Freq: Once | OROMUCOSAL | Status: AC

## 2023-06-06 MED ORDER — MIDAZOLAM HCL 2 MG/2ML IJ SOLN
INTRAMUSCULAR | Status: DC | PRN
  Administered 2023-06-06 (×2): 1 mg via INTRAVENOUS

## 2023-06-06 MED ORDER — PHENYLEPHRINE 80 MCG/ML (10ML) SYRINGE FOR IV PUSH (FOR BLOOD PRESSURE SUPPORT)
PREFILLED_SYRINGE | INTRAVENOUS | Status: DC | PRN
  Administered 2023-06-06 (×3): 80 ug via INTRAVENOUS
  Administered 2023-06-06: 160 ug via INTRAVENOUS
  Administered 2023-06-06: 80 ug via INTRAVENOUS

## 2023-06-06 MED ORDER — FENTANYL CITRATE (PF) 100 MCG/2ML IJ SOLN
INTRAMUSCULAR | Status: AC
  Filled 2023-06-06: qty 2

## 2023-06-06 MED ORDER — TOPIRAMATE 25 MG PO TABS
100.0000 mg | ORAL_TABLET | Freq: Two times a day (BID) | ORAL | Status: DC
  Administered 2023-06-06 – 2023-06-11 (×10): 100 mg via JEJUNOSTOMY
  Filled 2023-06-06 (×16): qty 4

## 2023-06-06 MED ORDER — BUPIVACAINE LIPOSOME 1.3 % IJ SUSP
INTRAMUSCULAR | Status: AC
  Filled 2023-06-06: qty 20

## 2023-06-06 MED ORDER — AMISULPRIDE (ANTIEMETIC) 5 MG/2ML IV SOLN
10.0000 mg | Freq: Once | INTRAVENOUS | Status: AC
  Administered 2023-06-06: 10 mg via INTRAVENOUS

## 2023-06-06 MED ORDER — CHLORHEXIDINE GLUCONATE CLOTH 2 % EX PADS: 6.0000 | MEDICATED_PAD | Freq: Once | CUTANEOUS | Status: DC

## 2023-06-06 MED ORDER — PHENYLEPHRINE 80 MCG/ML (10ML) SYRINGE FOR IV PUSH (FOR BLOOD PRESSURE SUPPORT)
PREFILLED_SYRINGE | INTRAVENOUS | Status: AC
  Filled 2023-06-06: qty 10

## 2023-06-06 MED ORDER — DIPHENHYDRAMINE HCL 50 MG/ML IJ SOLN: 12.5000 mg | Freq: Four times a day (QID) | INTRAMUSCULAR | Status: DC | PRN

## 2023-06-06 MED ORDER — VISTASEAL 10 ML SINGLE DOSE KIT
10.0000 mL | PACK | Freq: Once | CUTANEOUS | Status: AC
  Administered 2023-06-06: 10 mL via TOPICAL
  Filled 2023-06-06: qty 10

## 2023-06-06 MED ORDER — 0.9 % SODIUM CHLORIDE (POUR BTL) OPTIME
TOPICAL | Status: DC | PRN
  Administered 2023-06-06: 1000 mL
  Administered 2023-06-06: 2000 mL

## 2023-06-06 MED ORDER — SODIUM CHLORIDE 0.9 % IV SOLN
12.5000 mg | Freq: Four times a day (QID) | INTRAVENOUS | Status: DC | PRN
  Administered 2023-06-07 – 2023-06-10 (×2): 12.5 mg via INTRAVENOUS
  Filled 2023-06-06 (×2): qty 12.5

## 2023-06-06 MED ORDER — DEXAMETHASONE SODIUM PHOSPHATE 10 MG/ML IJ SOLN
INTRAMUSCULAR | Status: AC
  Filled 2023-06-06: qty 1

## 2023-06-06 MED ORDER — FENTANYL CITRATE (PF) 250 MCG/5ML IJ SOLN
INTRAMUSCULAR | Status: DC | PRN
  Administered 2023-06-06 (×2): 50 ug via INTRAVENOUS
  Administered 2023-06-06 (×2): 25 ug via INTRAVENOUS

## 2023-06-06 MED ORDER — DIPHENHYDRAMINE HCL 12.5 MG/5ML PO ELIX: 12.5000 mg | ORAL_SOLUTION | Freq: Four times a day (QID) | ORAL | Status: DC | PRN

## 2023-06-06 MED ORDER — ACETAMINOPHEN 10 MG/ML IV SOLN
INTRAVENOUS | Status: AC
  Filled 2023-06-06: qty 100

## 2023-06-06 MED ORDER — ACETAMINOPHEN 10 MG/ML IV SOLN
INTRAVENOUS | Status: DC | PRN
  Administered 2023-06-06: 1000 mg via INTRAVENOUS

## 2023-06-06 MED ORDER — LIDOCAINE 2% (20 MG/ML) 5 ML SYRINGE
INTRAMUSCULAR | Status: AC
  Filled 2023-06-06: qty 5

## 2023-06-06 MED ORDER — AMISULPRIDE (ANTIEMETIC) 5 MG/2ML IV SOLN
INTRAVENOUS | Status: AC
  Filled 2023-06-06: qty 4

## 2023-06-06 MED ORDER — OXYCODONE HCL 5 MG/5ML PO SOLN: 5.0000 mg | Freq: Once | ORAL | Status: DC | PRN

## 2023-06-06 MED ORDER — ONDANSETRON HCL 4 MG/2ML IJ SOLN
INTRAMUSCULAR | Status: DC | PRN
  Administered 2023-06-06: 4 mg via INTRAVENOUS

## 2023-06-06 MED ORDER — CYCLOSPORINE 0.05 % OP EMUL
1.0000 [drp] | Freq: Two times a day (BID) | OPHTHALMIC | Status: DC
  Administered 2023-06-06 – 2023-07-10 (×41): 1 [drp] via OPHTHALMIC
  Filled 2023-06-06 (×73): qty 30

## 2023-06-06 MED ORDER — PANTOPRAZOLE SODIUM 40 MG IV SOLR
40.0000 mg | Freq: Every day | INTRAVENOUS | Status: DC
  Administered 2023-06-06 – 2023-07-09 (×34): 40 mg via INTRAVENOUS
  Filled 2023-06-06 (×34): qty 10

## 2023-06-06 MED ORDER — METHOCARBAMOL 1000 MG/10ML IJ SOLN: 500.0000 mg | Freq: Three times a day (TID) | INTRAVENOUS | Status: DC | PRN

## 2023-06-06 MED ORDER — ONDANSETRON 4 MG PO TBDP
4.0000 mg | ORAL_TABLET | Freq: Four times a day (QID) | ORAL | Status: DC | PRN
  Filled 2023-06-06: qty 1

## 2023-06-06 MED ORDER — LACTATED RINGERS IV SOLN: INTRAVENOUS | Status: DC

## 2023-06-06 MED ORDER — METOCLOPRAMIDE HCL 5 MG/ML IJ SOLN
10.0000 mg | Freq: Three times a day (TID) | INTRAMUSCULAR | Status: AC | PRN
  Administered 2023-06-07: 10 mg via INTRAVENOUS
  Filled 2023-06-06 (×2): qty 2

## 2023-06-06 MED ORDER — PROPOFOL 10 MG/ML IV BOLUS
INTRAVENOUS | Status: AC
  Filled 2023-06-06: qty 20

## 2023-06-06 MED ORDER — ALBUTEROL SULFATE (2.5 MG/3ML) 0.083% IN NEBU: 3.0000 mL | INHALATION_SOLUTION | Freq: Four times a day (QID) | RESPIRATORY_TRACT | Status: DC | PRN

## 2023-06-06 MED ORDER — ONDANSETRON HCL 4 MG/2ML IJ SOLN
INTRAMUSCULAR | Status: AC
  Filled 2023-06-06: qty 2

## 2023-06-06 MED ORDER — ROCURONIUM BROMIDE 10 MG/ML (PF) SYRINGE
PREFILLED_SYRINGE | INTRAVENOUS | Status: DC | PRN
  Administered 2023-06-06: 50 mg via INTRAVENOUS
  Administered 2023-06-06: 10 mg via INTRAVENOUS
  Administered 2023-06-06: 20 mg via INTRAVENOUS
  Administered 2023-06-06: 10 mg via INTRAVENOUS

## 2023-06-06 MED ORDER — BUPIVACAINE LIPOSOME 1.3 % IJ SUSP
INTRAMUSCULAR | Status: DC | PRN
Start: 2023-06-06 — End: 2023-06-06
  Administered 2023-06-06: 20 mL

## 2023-06-06 MED ORDER — ENOXAPARIN SODIUM 30 MG/0.3ML IJ SOSY
30.0000 mg | PREFILLED_SYRINGE | INTRAMUSCULAR | Status: DC
  Administered 2023-06-07 – 2023-07-10 (×33): 30 mg via SUBCUTANEOUS
  Filled 2023-06-06 (×34): qty 0.3

## 2023-06-06 MED ORDER — EPHEDRINE 5 MG/ML INJ
INTRAVENOUS | Status: AC
  Filled 2023-06-06: qty 5

## 2023-06-06 MED ORDER — FENTANYL CITRATE (PF) 100 MCG/2ML IJ SOLN
25.0000 ug | INTRAMUSCULAR | Status: DC | PRN
  Administered 2023-06-06 (×3): 25 ug via INTRAVENOUS

## 2023-06-06 MED ORDER — SUGAMMADEX SODIUM 200 MG/2ML IV SOLN
INTRAVENOUS | Status: DC | PRN
  Administered 2023-06-06: 200 mg via INTRAVENOUS

## 2023-06-06 MED ORDER — MIDAZOLAM HCL 2 MG/2ML IJ SOLN
INTRAMUSCULAR | Status: AC
  Filled 2023-06-06: qty 2

## 2023-06-06 MED ORDER — LEVETIRACETAM IN NACL 1000 MG/100ML IV SOLN
1000.0000 mg | Freq: Two times a day (BID) | INTRAVENOUS | Status: DC
  Administered 2023-06-06 – 2023-06-17 (×23): 1000 mg via INTRAVENOUS
  Filled 2023-06-06 (×25): qty 100

## 2023-06-06 MED ORDER — EPHEDRINE SULFATE-NACL 50-0.9 MG/10ML-% IV SOSY
PREFILLED_SYRINGE | INTRAVENOUS | Status: DC | PRN
  Administered 2023-06-06: 10 mg via INTRAVENOUS

## 2023-06-06 MED ORDER — MORPHINE SULFATE (PF) 2 MG/ML IV SOLN
2.0000 mg | INTRAVENOUS | Status: DC | PRN
  Administered 2023-06-06 – 2023-06-09 (×22): 2 mg via INTRAVENOUS
  Filled 2023-06-06 (×22): qty 1

## 2023-06-06 MED ORDER — DEXAMETHASONE SODIUM PHOSPHATE 10 MG/ML IJ SOLN
INTRAMUSCULAR | Status: DC | PRN
  Administered 2023-06-06: 10 mg via INTRAVENOUS

## 2023-06-06 MED ORDER — ROCURONIUM BROMIDE 10 MG/ML (PF) SYRINGE
PREFILLED_SYRINGE | INTRAVENOUS | Status: AC
  Filled 2023-06-06: qty 10

## 2023-06-06 MED ORDER — FENTANYL CITRATE (PF) 250 MCG/5ML IJ SOLN
INTRAMUSCULAR | Status: AC
  Filled 2023-06-06: qty 5

## 2023-06-06 MED ORDER — PHENOL 1.4 % MT LIQD: 1.0000 | OROMUCOSAL | Status: DC | PRN

## 2023-06-06 MED ORDER — SODIUM CHLORIDE 0.9 % IV SOLN
2.0000 g | INTRAVENOUS | Status: AC
  Administered 2023-06-06: 2 g via INTRAVENOUS
  Filled 2023-06-06: qty 2

## 2023-06-06 MED ORDER — KCL IN DEXTROSE-NACL 20-5-0.45 MEQ/L-%-% IV SOLN
INTRAVENOUS | Status: DC
  Filled 2023-06-06 (×8): qty 1000

## 2023-06-06 MED ORDER — ACETAMINOPHEN 10 MG/ML IV SOLN
1000.0000 mg | Freq: Four times a day (QID) | INTRAVENOUS | Status: AC
  Administered 2023-06-06 – 2023-06-07 (×4): 1000 mg via INTRAVENOUS
  Filled 2023-06-06 (×4): qty 100

## 2023-06-06 MED ORDER — PROPOFOL 10 MG/ML IV BOLUS
INTRAVENOUS | Status: DC | PRN
  Administered 2023-06-06: 100 mg via INTRAVENOUS

## 2023-06-06 MED ORDER — BUPIVACAINE LIPOSOME 1.3 % IJ SUSP: 20.0000 mL | Freq: Once | INTRAMUSCULAR | Status: DC

## 2023-06-06 MED ORDER — LIDOCAINE 2% (20 MG/ML) 5 ML SYRINGE
INTRAMUSCULAR | Status: DC | PRN
  Administered 2023-06-06: 40 mg via INTRAVENOUS

## 2023-06-06 MED ORDER — ONDANSETRON HCL 4 MG/2ML IJ SOLN
4.0000 mg | Freq: Four times a day (QID) | INTRAMUSCULAR | Status: DC | PRN
  Administered 2023-06-06 – 2023-07-11 (×45): 4 mg via INTRAVENOUS
  Filled 2023-06-06 (×48): qty 2

## 2023-06-06 MED ORDER — BUPIVACAINE-EPINEPHRINE (PF) 0.25% -1:200000 IJ SOLN
INTRAMUSCULAR | Status: AC
  Filled 2023-06-06: qty 30

## 2023-06-06 SURGICAL SUPPLY — 62 items
APL PRP STRL LF DISP 70% ISPRP (MISCELLANEOUS) ×1
BAG COUNTER SPONGE SURGICOUNT (BAG) ×1 IMPLANT
BAG DRN RND TRDRP ANRFLXCHMBR (UROLOGICAL SUPPLIES) ×1
BAG SPNG CNTER NS LX DISP (BAG) ×1
BAG URINE DRAIN 2000ML AR STRL (UROLOGICAL SUPPLIES) IMPLANT
BLADE CLIPPER SURG (BLADE) IMPLANT
CANISTER SUCT 3000ML PPV (MISCELLANEOUS) ×1 IMPLANT
CATH MALECOT BARD 24FR (CATHETERS) IMPLANT
CHLORAPREP W/TINT 26 (MISCELLANEOUS) ×1 IMPLANT
COVER SURGICAL LIGHT HANDLE (MISCELLANEOUS) ×1 IMPLANT
DRAPE LAPAROSCOPIC ABDOMINAL (DRAPES) ×1 IMPLANT
DRAPE WARM FLUID 44X44 (DRAPES) ×1 IMPLANT
DRSG MEPILEX POST OP 4X8 (GAUZE/BANDAGES/DRESSINGS) IMPLANT
DRSG OPSITE POSTOP 4X10 (GAUZE/BANDAGES/DRESSINGS) IMPLANT
DRSG OPSITE POSTOP 4X8 (GAUZE/BANDAGES/DRESSINGS) IMPLANT
ELECT BLADE 6.5 EXT (BLADE) IMPLANT
ELECT CAUTERY BLADE 6.4 (BLADE) ×1 IMPLANT
ELECT REM PT RETURN 9FT ADLT (ELECTROSURGICAL) ×1
ELECTRODE REM PT RTRN 9FT ADLT (ELECTROSURGICAL) ×1 IMPLANT
GLOVE BIOGEL M STRL SZ7.5 (GLOVE) ×1 IMPLANT
GLOVE INDICATOR 8.0 STRL GRN (GLOVE) ×2 IMPLANT
GOWN SPEC L4 XLG W/TWL (GOWN DISPOSABLE) ×1 IMPLANT
GOWN STRL REUS W/ TWL LRG LVL3 (GOWN DISPOSABLE) ×1 IMPLANT
GOWN STRL REUS W/TWL LRG LVL3 (GOWN DISPOSABLE) ×4
HANDLE SUCTION POOLE (INSTRUMENTS) ×1 IMPLANT
J-TUBE MIC 16FX51 UNV ENFIT (TUBING) IMPLANT
KIT BASIN OR (CUSTOM PROCEDURE TRAY) ×1 IMPLANT
KIT TURNOVER KIT B (KITS) ×1 IMPLANT
LIGASURE IMPACT 36 18CM CVD LR (INSTRUMENTS) IMPLANT
LIGASURE LAP MARYLAND 5MM 37CM (ELECTROSURGICAL) IMPLANT
NS IRRIG 1000ML POUR BTL (IV SOLUTION) ×2 IMPLANT
PACK GENERAL/GYN (CUSTOM PROCEDURE TRAY) ×1 IMPLANT
PAD ARMBOARD 7.5X6 YLW CONV (MISCELLANEOUS) ×1 IMPLANT
RELOAD STAPLE 60 2.6 WHT THN (STAPLE) IMPLANT
RELOAD STAPLE 60 3.6 BLU REG (STAPLE) IMPLANT
RELOAD STAPLE 60 4.1 GRN THCK (STAPLE) IMPLANT
RETRACTOR WND ALEXIS 25 LRG (MISCELLANEOUS) IMPLANT
RTRCTR WOUND ALEXIS 25CM LRG (MISCELLANEOUS) ×1
SPECIMEN JAR LARGE (MISCELLANEOUS) IMPLANT
SPONGE DRAIN TRACH 4X4 STRL 2S (GAUZE/BANDAGES/DRESSINGS) IMPLANT
SPONGE T-LAP 18X18 ~~LOC~~+RFID (SPONGE) IMPLANT
STAPLE ECHEON FLEX 60 POW ENDO (STAPLE) IMPLANT
STAPLER RELOAD BLUE 60MM (STAPLE) ×2
STAPLER RELOAD GREEN 60MM (STAPLE) ×3
STAPLER RELOAD WHITE 60MM (STAPLE) ×3
STAPLER VISISTAT 35W (STAPLE) ×1 IMPLANT
SUCTION POOLE HANDLE (INSTRUMENTS) ×2
SUT DVC VLOC 180 2-0 12IN GS21 (SUTURE) ×1
SUT ETHILON 2 0 FS 18 (SUTURE) IMPLANT
SUT PDS AB 1 TP1 96 (SUTURE) ×2 IMPLANT
SUT SILK 2 0 SH CR/8 (SUTURE) ×1 IMPLANT
SUT SILK 2 0 TIES 10X30 (SUTURE) ×1 IMPLANT
SUT SILK 3 0 SH CR/8 (SUTURE) ×1 IMPLANT
SUT SILK 3 0 TIES 10X30 (SUTURE) ×1 IMPLANT
SUT V-LOC BARB 180 2/0GR6 GS22 (SUTURE) ×4
SUT VIC AB 3-0 SH 18 (SUTURE) IMPLANT
SUTURE DVC VL 180 2-0 12INGS21 (SUTURE) IMPLANT
SUTURE V-LC BRB 180 2/0GR6GS22 (SUTURE) IMPLANT
TOWEL GREEN STERILE (TOWEL DISPOSABLE) ×1 IMPLANT
TRAY FOLEY W/BAG SLVR 14FR (SET/KITS/TRAYS/PACK) IMPLANT
TUBE JEJUNAL 16FR ENFIT (TUBING) ×1
YANKAUER SUCT BULB TIP NO VENT (SUCTIONS) IMPLANT

## 2023-06-06 NOTE — Progress Notes (Addendum)
Initial Nutrition Assessment  DOCUMENTATION CODES:   Underweight  INTERVENTION:   When ready to use J-tube, recommend: Jevity 1.2 at 20 ml/h, increase by 10 ml every 4 hours to goal rate of 60 ml/h (1440 ml per day)  Provides 1728 kcal, 80 gm protein, 1166 ml free water daily  When enteral nutrition is tolerated, will need to begin bariatric vitamin supplementation: MVI x 2 (can be crushed and given via tube) and Calcium carbonate 500 mg TID.  NUTRITION DIAGNOSIS:   Increased nutrient needs related to post-op healing as evidenced by estimated needs.  GOAL:   Patient will meet greater than or equal to 90% of their needs  MONITOR:   TF tolerance  REASON FOR ASSESSMENT:   Consult Enteral/tube feeding initiation and management  ASSESSMENT:   67 yo female admitted with GOO d/t chronic PUD. S/P distal gastrectomy with roux en y gastrojejunostomy and placement of gastric and jejunal feeding tubes 9/20. PMH includes PUD, gastroparesis, duodenal diverticulum, duodenal bulb stenosis, esophagitis, GERD, hiatal hernia, gallstones, meningioma, HTN, seizures, B-12 deficiency.  Unable to speak with patient or complete NFPE at this time. Per H&P, patient c/o early satiety and nausea at home. She occasionally has a burning in her esophagus when swallowing food. She was mainly on a liquid diet PTA.   Received MD Consult for TF recommendations. Not quite ready to begin J-tube feedings. G-tube may be used for decompression.   Labs reviewed.  Medications reviewed and include Zofran, Protonix. IVF: D5 1/2 NS with KCl 20 mEq/L at 75 ml/h.  Weight history reviewed. Patient weighed 54 kg in October 2023. 45.4 kg April 2024. Currently 47.6 kg. 16% weight loss in 6 months from October 2023 to April 2024 is significant. Weight has minimally increased since then.   Suspect patient is malnourished; unable to obtain enough information at this time for identification of malnutrition.   NUTRITION -  FOCUSED PHYSICAL EXAM:  Unable to complete  Diet Order:   Diet Order     None       EDUCATION NEEDS:   Not appropriate for education at this time  Skin:  Skin Assessment: Skin Integrity Issues: Skin Integrity Issues:: Incisions Incisions: surgical incision to abdomen  Last BM:  unknown  Height:   Ht Readings from Last 1 Encounters:  06/06/23 5\' 4"  (1.626 m)    Weight:   Wt Readings from Last 1 Encounters:  06/06/23 47.6 kg    Ideal Body Weight:  54.5 kg  BMI:  Body mass index is 18.02 kg/m.  Estimated Nutritional Needs:   Kcal:  1650-1850  Protein:  70-90 gm  Fluid:  1.7-1.9 L   Gabriel Rainwater RD, LDN, CNSC Please refer to Amion for contact information.

## 2023-06-06 NOTE — Op Note (Signed)
NAME: Lindsay Frost, Lindsay Frost MEDICAL RECORD NO: [id number] ACCOUNT NO: [id number] DATE OF BIRTH: [birth year] FACILITY: [facility] LOCATION: [facility] PHYSICIAN: Mary Sella. 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  PROCEDURE: 1.  Exploratory laparotomy. 2.  Distal gastrectomy with Roux-en-Y gastrojejunostomy. 3.  Placement of 24-French gastric tube. 4.  Placement of 18-French jejunostomy tube.  SURGEON:  Mary Sella. Andrey Campanile, MD  ASSISTANT SURGEON:  Cristal Deer white (an assistant was requested due to the complexity of the anatomy and need for assistance in tissue manipulation).  ANESTHESIA:  General.  ESTIMATED BLOOD LOSS:  100.  DRAINS:  Foley catheter, 24-French Malecot tube as a gastrotomy tube and an 18-French jejunostomy tube.  SPECIMEN: Distal stomach and D1.  INDICATIONS FOR PROCEDURE:  The patient is a pleasant 67 year old female who has a longstanding history of peptic ulcer disease along with reflux.  On imaging modality she had a gastric outlet obstruction due to a stenosed pylorus.  It had been dilated  in the past.  Gastric emptying studies demonstrated severe gastroparesis.  We had a long conversation regarding surgical options.  We discussed distal gastrectomy with Roux-en-Y reconfiguration and temporary G-tube and J-tube placement because of  concerns for gastric ileus after surgery.  We did also talk about palliative gastrojejunostomy, loop bypass in case anatomy was not favorable for resection.  Risks and benefits were documented and separately discussed with the patient.  DESCRIPTION OF PROCEDURE: The patient was taken to OR #1 at [facility] after informed consent was obtained.  She was placed supine on the operating room table.  General endotracheal anesthesia was established.   Sequential compression devices  were placed.  A Foley catheter was placed.  Her abdomen was prepped and draped in the usual standard surgical fashion.  A surgical timeout was performed.  An upper midline incision was made sharply with a 10 blade.  Subcutaneous tissue was divided with  electrocautery.  The fascia was entered and the abdominal cavity was entered.  A Balfour retractor was placed.  The patient had, as expected, a very massive distended stomach.  It went down to her pelvic inlet.  The distal stomach appeared quite mobile.   The pylorus did not appear densely adherent to the surrounding structures.  The patient had little to no adipose tissue in her abdomen and her tissue planes were quite thin.  We first started kocherized the duodenum.  This was a combination of blunt  dissection along with right-angle with Bovie electrocautery.  After doing this, we felt that we could proceed with a distal gastrectomy.  We palpated the pylorus.  It was stenosed, but not overtly hard or firm.  But this was clearly the source of  obstruction.  We mobilized the duodenum as described above.  I took down some of the tissue along the lesser curvature around the pylorus and D1 using right angle and tonsil and using a combination of electrocautery along with the laparoscopic LigaSure  [place] device.  We stayed as close to the duodenum as we could to avoid the pancreas.  We then opened up the lesser sac.  We took down the gastrocolic ligament with electrocautery as well as with laparoscopic LigaSure device.  We then started taking  down some of the short gastrics along the greater curvature of the stomach.  I was able to create  a window at the first portion of the duodenum.  We then brought an [employer] echelon stapler 60 mm white load and stapled across D1.  This left a very nice  clean duodenal stump staple line.  We were then able to mobilize the pylorus away from the surrounding pancreas, staying right on the  gastric wall.  We did not need to take the GDA.  The portal structures were identified and preserved.  The right  gastroepiploic was taken with laparoscopic LigaSure.  We then mobilized a good portion of the distal stomach.  We then found and started to resect across the antrum.  This was done in sequential fashion with echelon 60 stapler with a green load.  This  required multiple fires.  This freed our specimen.  Because of her reflux I decided to do a Roux-en-Y reconstruction.  We identified the ligament of Treitz.  We could actually also easily see D3 and D4 due to her lack of intra-abdominal fat tissue.  I  went about 30 cm downstream from the ligament of Treitz and divided the jejunum with a single fire of the echelon 60 stapler with a white load.  We took down some of the mesentery slightly with laparoscopic LigaSure device.  We marked the Roux limb with  a 3-0 silk suture.  We then went about 25 cm downstream and brought our BP limb and anastomosed it to about 25-30 cm downstream from our Roux limb staple line.  I did not feel the patient needed a long Roux limb.  This was not for weight loss in fact the  patient was underweight.  The small bowel was brought together.  Enterotomies were made in the BP limb and in the Roux limb, echelon 60 stapler with a white load was placed through each of the enterotomies and brought together and fired to create a  common channel.  We then closed the common enterotomy with 2 running 2-0 V-Loc sutures.  I then closed the mesenteric defect with interrupted 2-0 silk sutures.  We then brought the Roux limb up to the proximal stomach.  It should be noted the patient  still has large amount of remaining stomach.  The Roux limb was aligned with the distal stomach, anterior to the gastric staple line.  We made a gastrotomy and then decompressed the stomach since it had a retained food residue in liquid in it.  We  evacuated about 300-400 mL of gastric contents.  We made an  enterotomy in the Roux limb just distal to its staple line.  One limb of the echelon 60 stapler with a blue load was placed through the enterotomy in the abdomen through the gastrotomy.  The  stapler was brought together to create a common channel and fired.  We then closed the common defect with 2 running 2-0 V-Loc sutures.  A 3-0 silk suture was placed in the crotch of the gastrojejunostomy.  I then oversewed the duodenal stump with several  interrupted 3-0 silk sutures.  Because of an anticipated gastric ileus we placed a 24-French Malecot through the left upper quadrant abdominal wall.  A pursestring suture of 2-0 silk was placed in the proximal stomach near the greater curvature and  outer pursestring was placed as well.  A gastrotomy was made and the Malecot was introduced into the stomach.  The 2 pursestrings were tied down.  I then Stamm the proximal stomach around the G-tube to the abdominal wall with three 2-0 silk sutures.  We  then placed a feeding jejunostomy slightly downstream from her gastrojejunostomy in the Roux limb.  A 3-0 silk suture was placed on the antimesenteric side of the Roux limb with a 3-0 silk suture.  An 18-French feeding jejunostomy tube was brought  through the abdominal wall in the left mid abdomen, enterotomy was made and the jejunostomy tube was advanced through the enterotomy and to the point where the balloon was intraluminal.  The balloon was inflated with about 2 mL of saline, so that it did  not occlude the entire lumen.  The pursestring suture was tied down.  We then Witzel the J-tube into the Roux limb with several interrupted 3-0 silk sutures.  We then anchored the Roux limb to the left anterior abdominal wall and Stamm to the J-tube to  the anterior abdominal wall peritoneum with three 2-0 silk sutures.  It was Stamm in such a way to minimize twisting of the Roux limb.  The abdomen was then irrigated.  The G-tube and J-tube were secured to the skin with 2-0  nylons.  We then irrigated  the abdomen.  VISTASEAL was placed over the duodenal stump as well as over the gastrojejunostomy.  The fascia was then closed after Exparel was infiltrated along the fascial edges with #1 looped PDS 1 from above and one from below tied centrally.  The  subcutaneous tissue was irrigated and the skin was closed with skin staples.  Prior to completely closing the skin we were informed that the [facility] count was off by 1 [employer].  Despite an extensive search on the operative field and the back table and in the  OR, the missing Tresa Endo could not be found.  Two x-rays were obtained of the abdomen demonstrating no retained foreign body.  The skin was then finally closed followed by application of sterile dressing.  All needle and sponge counts were correct x2.   There were no immediate complications.  The patient tolerated the procedure well.  She was extubated and taken to the recovery room in stable condition.   PUS D: 06/06/2023 1:32:18 pm T: 06/06/2023 4:24:00 pm  JOB: [id number]/ [id number]

## 2023-06-06 NOTE — H&P (Signed)
CC: here for surgery  Requesting provider: dr Barron Alvine  HPI: Lindsay Frost is an 67 y.o. 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  She denies any medical changes since I saw her in the clinic in May.  She states that she is gained a little bit of weight.  She is vomiting about once a week now.  She is mainly doing a liquid diet.  Occasionally she will try to eat some solid food.  She did have a bad episode of bronchitis in the summer but that is resolved.  No shortness of breath.  Having a bowel movement every few days.  Old hpi: She has multiple issues in her foregut. 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  I actually met her in February 2021 to discuss her reflux and foregut symptoms. She states that her reflux and heartburn has continued to worsen. It is definitely worse over the past 6 months she states. She describes a burning in her chest. She reports vomiting food a few times a month. She sleeps elevated. She takes Nexium twice a day along with famotidine twice a day. She states that it really does not seem like the medications are working however she takes them religiously. She also takes Carafate pills. She has a bowel movement about every 3 days which is her baseline. She reports getting full on small amounts of food. She also has nausea. No bloating. No sensation of anything getting stuck when eating or drinking. She does have some occasional pain with swallowing solid foods which she describes as a burning in her esophagus. Only prior abdominal surgery has been cholecystectomy by Dr. Doylene Canard.   Past Medical History:  Diagnosis Date   Acute midline thoracic back pain 02/16/2021   Acute sinusitis 09/05/2014   Allergic  rhinitis 07/16/2016   Allergy 2005   Anemia 2021   Arthritis    B12 deficiency 01/02/2021   Back pain    arthritis   Chronic sinusitis 12/05/2020   Depression    takes cymbalta daily   Diarrhea 02/16/2021   Duodenal stenosis    Epigastric pain 07/29/2018   Essential hypertension 05/14/2019   Gallstones    Generalized abdominal pain 02/16/2021   GERD (gastroesophageal reflux disease)    takes Omeprazole daily   Headache    Heart murmur    Hiatal hernia    History of gastric ulcer    History of migraine    last one about 2wks ago-takes Imitrex prn   History of seizures    Joint pain    Joint swelling    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   Meningioma (HCC)    Migraine without aura and without status migrainosus, not intractable 01/13/2015   Mixed dyslipidemia 07/25/2020   Myringotomy tube status 02/27/2021   Nausea 02/16/2021   Nocturnal leg cramps 02/20/2015   Obesity (BMI 30-39.9) 07/22/2014   Osteoarthritis of left knee 12/02/2012   Overactive bladder 01/02/2021   Peptic ulcer disease    Physical exam 07/22/2014   Recurrent acute serous otitis media of left ear 12/05/2020   Seizure-like activity (HCC) 05/12/2014    Past Surgical History:  Procedure Laterality Date   BALLOON DILATION  04/03/2012   Procedure: BALLOON DILATION;  Surgeon: Theda Belfast, MD;  Location: [facility];  Service: Endoscopy;  Laterality: N/A;   BIOPSY  08/24/2019   Procedure: BIOPSY;  Surgeon: Shellia Cleverly, DO;  Location: [facility];  Service: Gastroenterology;;  EGD and COLON   BRAVO PH STUDY N/A 08/24/2019   Procedure: BRAVO PH STUDY;  Surgeon: Shellia Cleverly, DO;  Location: [facility];  Service: Gastroenterology;  Laterality: N/A;   CHOLECYSTECTOMY N/A 04/24/2018   Procedure: LAPAROSCOPIC CHOLECYSTECTOMY;  Surgeon: Berna Bue, MD;  Location: [facility];  Service: General;  Laterality: N/A;   COLONOSCOPY     COLONOSCOPY  WITH PROPOFOL N/A 08/24/2019   Procedure: COLONOSCOPY WITH PROPOFOL;  Surgeon: Shellia Cleverly, DO;  [facility]: [facility];  Service: Gastroenterology;  Laterality: N/A;   DIAGNOSTIC LAPAROSCOPY  60yrs ago   ESOPHAGEAL MANOMETRY N/A 09/04/2015   Procedure: ESOPHAGEAL MANOMETRY (EM);  Surgeon: Jeani Hawking, MD;  [facility]: [facility];  Service: Endoscopy;  Laterality: N/A;   ESOPHAGEAL MANOMETRY N/A 12/25/2022   Procedure: ESOPHAGEAL MANOMETRY (EM);  Surgeon: Shellia Cleverly, DO;  [facility]: [facility];  Service: Gastroenterology;  Laterality: N/A;   ESOPHAGOGASTRODUODENOSCOPY  04/03/2012   Procedure: ESOPHAGOGASTRODUODENOSCOPY (EGD);  Surgeon: Theda Belfast, MD;  Location: [facility];  Service: Endoscopy;  Laterality: N/A;  EGD w/ balloon dilation   ESOPHAGOGASTRODUODENOSCOPY (EGD) WITH PROPOFOL N/A 02/18/2014   Procedure: ESOPHAGOGASTRODUODENOSCOPY (EGD) WITH PROPOFOL;  Surgeon: Theda Belfast, MD;  [facility];  Service: Endoscopy;  Laterality: N/A;   ESOPHAGOGASTRODUODENOSCOPY (EGD) WITH PROPOFOL N/A 04/22/2014   Procedure: ESOPHAGOGASTRODUODENOSCOPY (EGD) WITH PROPOFOL;  Surgeon: Theda Belfast, MD;  [facility];  Service: Endoscopy;  Laterality: N/A;   ESOPHAGOGASTRODUODENOSCOPY (EGD) WITH PROPOFOL N/A 08/24/2019   Procedure: ESOPHAGOGASTRODUODENOSCOPY (EGD) WITH PROPOFOL;  Surgeon: Shellia Cleverly, DO;  [facility];  Service: Gastroenterology;  Laterality: N/A;   EYE SURGERY Bilateral 2021   LEFT HEART CATH AND CORONARY ANGIOGRAPHY N/A 04/18/2021   Procedure: LEFT HEART CATH AND CORONARY ANGIOGRAPHY;  Surgeon: Runell Gess, MD;  Location: [facility];  Service: Cardiovascular;  Laterality: N/A;   NASAL SEPTUM SURGERY  09/2016   PH IMPEDANCE STUDY N/A 09/04/2015   Procedure: PH IMPEDANCE STUDY;  Surgeon: Jeani Hawking, MD;  Location: [facility];  Service: Endoscopy;  Laterality: N/A;   REPLACEMENT TOTAL KNEE Right 28yrs ago   TOTAL  KNEE ARTHROPLASTY Left 11/30/2012   Procedure: LEFT TOTAL KNEE ARTHROPLASTY;  Surgeon: Nestor Lewandowsky, MD;  Location: [facility];  Service: Orthopedics;  Laterality: Left;    Family History  Problem Relation Age of Onset   Arthritis Mother    Cancer Mother        breast   Heart disease Mother        died from "heart problems" at age 82, had CAD   Depression Mother    Heart disease Father        had CABG   Kidney disease Father    Arthritis Sister    Heart murmur Sister    Arthritis Sister    Aneurysm Sister    Arthritis Brother        "serious back/neck problems"   Healthy Daughter    Colon cancer Neg Hx    Esophageal cancer Neg Hx    Pancreatic cancer Neg Hx    Stomach cancer Neg Hx    Rectal cancer Neg Hx     Social:  reports that she has never smoked. She has never used smokeless tobacco. She reports that she does not  drink alcohol and does not use drugs.  Allergies:  Allergies  Allergen Reactions   Naratriptan     Ineffective    Medications: I have reviewed the patient's current medications.   ROS - all of the below systems have been reviewed with the patient and positives are indicated with bold text General: chills, fever or night sweats Eyes: blurry vision or double vision ENT: epistaxis or sore throat Allergy/Immunology: itchy/watery eyes or nasal congestion Hematologic/Lymphatic: bleeding problems, blood clots or swollen lymph nodes Endocrine: temperature intolerance or unexpected weight changes Breast: new or changing breast lumps or nipple discharge Resp: cough, shortness of breath, or wheezing CV: chest pain or dyspnea on exertion GI: as per HPI GU: dysuria, trouble voiding, or hematuria MSK: joint pain or joint stiffness Neuro: TIA or stroke symptoms Derm: pruritus and skin lesion changes Psych: anxiety and depression  PE Blood pressure (!) 146/88, pulse 70, temperature 98.2 F (36.8 C), temperature source Oral, resp. rate 18, height 5\' 4"  (1.626 m),  weight 47.6 kg, SpO2 98%. Constitutional: NAD; conversant; no deformities Eyes: Moist conjunctiva; no lid lag; anicteric; PERRL Neck: Trachea midline; no thyromegaly Lungs: Normal respiratory effort; no tactile fremitus CV: RRR; no palpable thrills; no pitting edema GI: Abd soft, nt, nd, old trocar scarrs; no palpable hepatosplenomegaly; old trocar scars MSK: Normal gait; no clubbing/cyanosis Psychiatric: Appropriate affect; alert and oriented x3 Lymphatic: No palpable cervical or axillary lymphadenopathy Skin:no rash/lesions/  No results found for this or any previous visit (from the past 48 hour(s)).  No results found.  Imaging: Reviwed CT, egd, upper gi, gastric emptying  Gastric emptying: 1% emptied at 1 hr ( normal >= 10%)   3% emptied at 2 hr ( normal >= 40%)   4% emptied at 3 hr ( normal >= 70%)   5% emptied at 4 hr ( normal >= 90%)  Esophageal Manometry reviewed and demonstrate elevated resting EG junction pressure with complete relaxation and normal IRP.  Esophageal contraction were normal and only 60% of swallows, and absent peristalsis in 40% of swallows.  Normal relaxation and contractility with multiple rapid swallow sequence, but complete bolus clearance on only 6/10 swallows.    A/P: FIORELLA WALLISCH is an 67 y.o. female with  Chronic  gastric outlet obstruction due to peptic ulcer disease Delayed gastric emptying Duodenal diverticulum Small hiatal hernia   2 OR for laparotomy, distal gastrectomy, with Roux-en-Y reconfiguration with placement and of gastrotomy and jejunostomy tubes.  Did discuss simple gastrojejunostomy with G and J tubes if anatomy not favorable to resection of the stomach.  Discussed rationale for placement of G and J tubes.  Discussed potentially delayed gastric emptying after this surgery.  Discussed potentially need to venting the G-tube's.  We discussed jejunostomy tube feeds.  We rediscussed the typical hospitalization.  We rediscussed  risk and benefits of surgery including but not limited to bleeding, infection, leak, delayed gastric emptying, blood clot formation, perioperative cardiac and pulmonary events, issues with the gastrotomy tube and jejunostomy tube such as dislodgment, skin irritation, clogging.  Incisional hernia.  Failure to ameliorate all symptoms.  Iv abx on call  Mary Sella. Andrey Campanile, MD, FACS General, Bariatric, & Minimally Invasive Surgery [facility],  A [facility]   Mary Sella. Andrey Campanile, MD, FACS General, Bariatric, & Minimally Invasive Surgery [facility] Surgery A [facility]

## 2023-06-06 NOTE — Anesthesia Postprocedure Evaluation (Signed)
Anesthesia Post Note  Patient: Lindsay Frost  Procedure(s) Performed: EXPLORATION LAPAROTOMY (Abdomen) DISTAL GASTRECTOMY (Abdomen) PLACEMENT OF GASTRIC AND JEJUNAL FEEDING TUBES (Abdomen)     Patient location during evaluation: PACU Anesthesia Type: General Level of consciousness: awake and alert Pain management: pain level controlled Vital Signs Assessment: post-procedure vital signs reviewed and stable Respiratory status: spontaneous breathing, nonlabored ventilation, respiratory function stable and patient connected to nasal cannula oxygen Cardiovascular status: blood pressure returned to baseline and stable Postop Assessment: no apparent nausea or vomiting Anesthetic complications: no   No notable events documented.  Last Vitals:  Vitals:   06/06/23 1226 06/06/23 1230  BP:  120/65  Pulse: 80 78  Resp: 14 13  Temp:    SpO2: 94% 94%    Last Pain:  Vitals:   06/06/23 1230  TempSrc:   PainSc: Asleep                 Beryle Lathe

## 2023-06-06 NOTE — Brief Op Note (Addendum)
06/06/2023  11:29 AM  PATIENT:  Lindsay Frost  67 y.o. female  PRE-OPERATIVE DIAGNOSIS:  GASTRIC OUTLET OBSTRUCTION due to chronic PUD disease  POST-OPERATIVE DIAGNOSIS:  same  PROCEDURE:  Procedure(s): EXPLORATION LAPAROTOMY (N/A) DISTAL GASTRECTOMY with roux en y gastrojejunostomy (N/A) PLACEMENT OF GASTRIC (68fr) AND JEJUNAL FEEDING TUBES (18 fr) (N/A)  SURGEON:  Surgeons and Role:    Gaynelle Adu, MD - Primary    * White, Stephanie Coup, MD - Assisting  PHYSICIAN ASSISTANT:   ASSISTANTS: Angelena Form MD FACS   ANESTHESIA:   general  EBL:  100 mL   BLOOD ADMINISTERED:none  DRAINS: Urinary Catheter (Foley), Gastrostomy Tube, and Jejunostomy Tube   LOCAL MEDICATIONS USED:  OTHER exparel  SPECIMEN:  Source of Specimen:  distal stomach and D1  DISPOSITION OF SPECIMEN:  PATHOLOGY  COUNTS:  YES -surgical count off by 1 Kelly.  Prior to final skin closure a abdominal x-ray was performed and there was no evidence of retained foreign object.  TOURNIQUET:  * No tourniquets in log *  DICTATION: .Reubin Milan Dictation  [id number]  PLAN OF CARE: Admit to inpatient   PATIENT DISPOSITION:  PACU - hemodynamically stable.   Delay start of Pharmacological VTE agent (>24hrs) due to surgical blood loss or risk of bleeding: no  Mary Sella. Andrey Campanile, MD, FACS General, Bariatric, & Minimally Invasive Surgery [facility] Surgery,  A [facility]

## 2023-06-06 NOTE — Transfer of Care (Signed)
Immediate Anesthesia Transfer of Care Note  Patient: Lindsay Frost  Procedure(s) Performed: EXPLORATION LAPAROTOMY (Abdomen) DISTAL GASTRECTOMY (Abdomen) PLACEMENT OF GASTRIC AND JEJUNAL FEEDING TUBES (Abdomen)  Patient Location: PACU  Anesthesia Type:General  Level of Consciousness: awake, alert , and oriented  Airway & Oxygen Therapy: Patient Spontanous Breathing  Post-op Assessment: Report given to RN and Post -op Vital signs reviewed and stable  Post vital signs: Reviewed and stable  Last Vitals:  Vitals Value Taken Time  BP 130/73 06/06/23 1130  Temp    Pulse 80 06/06/23 1130  Resp 15 06/06/23 1130  SpO2 95 % 06/06/23 1130  Vitals shown include unfiled device data.  Last Pain:  Vitals:   06/06/23 0607  TempSrc:   PainSc: 0-No pain      Patients Stated Pain Goal: 0 (06/06/23 1610)  Complications: No notable events documented.

## 2023-06-06 NOTE — Anesthesia Procedure Notes (Signed)
Procedure Name: Intubation Date/Time: 06/06/2023 8:07 AM  Performed by: Georgianne Fick D, CRNAPre-anesthesia Checklist: Patient identified, Emergency Drugs available, Suction available and Patient being monitored Patient Re-evaluated:Patient Re-evaluated prior to induction Oxygen Delivery Method: Circle System Utilized Preoxygenation: Pre-oxygenation with 100% oxygen Induction Type: IV induction Ventilation: Mask ventilation without difficulty Laryngoscope Size: Mac and 3 Grade View: Grade I Tube type: Oral Tube size: 7.0 mm Number of attempts: 1 (Intubated by paramedic student under direction supervision of CRNA and MD) Airway Equipment and Method: Stylet and Oral airway Placement Confirmation: ETT inserted through vocal cords under direct vision, positive ETCO2 and breath sounds checked- equal and bilateral Secured at: 18 cm Tube secured with: Tape Dental Injury: Teeth and Oropharynx as per pre-operative assessment

## 2023-06-07 ENCOUNTER — Encounter (HOSPITAL_COMMUNITY): Payer: Self-pay | Admitting: General Surgery

## 2023-06-07 LAB — BASIC METABOLIC PANEL
Anion gap: 6 (ref 5–15)
BUN: 18 mg/dL (ref 8–23)
CO2: 20 mmol/L — ABNORMAL LOW (ref 22–32)
Calcium: 7.6 mg/dL — ABNORMAL LOW (ref 8.9–10.3)
Chloride: 108 mmol/L (ref 98–111)
Creatinine, Ser: 0.8 mg/dL (ref 0.44–1.00)
GFR, Estimated: >60 mL/min (ref >60)
Glucose, Bld: 131 mg/dL — ABNORMAL HIGH (ref 70–99)
Potassium: 3.6 mmol/L (ref 3.5–5.1)
Sodium: 134 mmol/L — ABNORMAL LOW (ref 135–145)

## 2023-06-07 LAB — CBC
HCT: 37.6 % (ref 36.0–46.0)
Hemoglobin: 12.2 g/dL (ref 12.0–15.0)
MCH: 30.6 pg (ref 26.0–34.0)
MCHC: 32.4 g/dL (ref 30.0–36.0)
MCV: 94.2 fL (ref 80.0–100.0)
Platelets: 295 10*3/uL (ref 150–400)
RBC: 3.99 MIL/uL (ref 3.87–5.11)
RDW: 13.6 % (ref 11.5–15.5)
WBC: 12.9 10*3/uL — ABNORMAL HIGH (ref 4.0–10.5)
nRBC: 0 % (ref 0.0–0.2)

## 2023-06-07 NOTE — Progress Notes (Signed)
Assessment & Plan: POD#1 - status post ex lap, distal gastrectomy with roux-en-y gastrojejunostomy, G-tube, J-tube - Dr. Andrey Campanile 06/06/2023 - ice chips, sips water - G-tube to drainage - Foley out, patient OOB        Darnell Level, MD [facility] A [facility] practice Office: [phone]        Chief Complaint: Gastric outlet obstruction  Subjective: Patient in bed, family at bedside.  Mild pain.  Voiding.  Objective: Vital signs in last 24 hours: Temp:  [98.1 F (36.7 C)-99.2 F (37.3 C)] 98.8 F (37.1 C) (09/21 0700) Pulse Rate:  [76-85] 76 (09/21 0700) Resp:  [16-18] 18 (09/21 0700) BP: (88-119)/(63-73) 119/72 (09/21 0700) SpO2:  [94 %-99 %] 99 % (09/21 0700)    Intake/Output from previous day: 09/20 0701 - 09/21 0700 In: 2393.6 [I.V.:1873.6; IV Piggyback:500] Out: 2200 [Urine:2100; Blood:100] Intake/Output this shift: No intake/output data recorded.  Physical Exam: HEENT - sclerae clear, mucous membranes moist Neck - soft Abdomen - soft, mild distension; midline wound dry and intact with honeycomb dressing; Gtube to drainage Ext - no edema, non-tender  Lab Results:  Recent Labs    06/07/23 0459  WBC 12.9*  HGB 12.2  HCT 37.6  PLT 295   BMET Recent Labs    06/07/23 0459  NA 134*  K 3.6  CL 108  CO2 20*  GLUCOSE 131*  BUN 18  CREATININE 0.80  CALCIUM 7.6*   PT/INR No results for input(s): "LABPROT", "INR" in the last 72 hours. Comprehensive Metabolic Panel:    Component Value Date/Time   NA 134 (L) 06/07/2023 0459   NA 139 05/30/2023 1200   NA 144 04/13/2021 1544   NA 142 07/25/2020 1427   K 3.6 06/07/2023 0459   K 3.5 05/30/2023 1200   CL 108 06/07/2023 0459   CL 108 05/30/2023 1200   CO2 20 (L) 06/07/2023 0459   CO2 20 (L) 05/30/2023 1200   BUN 18 06/07/2023 0459   BUN 11 05/30/2023 1200   BUN 15 04/13/2021 1544   BUN 15 07/25/2020 1427   CREATININE 0.80 06/07/2023 0459   CREATININE 0.77 05/30/2023 1200    CREATININE 1.08 (H) 02/16/2021 1506   CREATININE 0.99 07/04/2020 1530   GLUCOSE 131 (H) 06/07/2023 0459   GLUCOSE 89 05/30/2023 1200   CALCIUM 7.6 (L) 06/07/2023 0459   CALCIUM 8.6 (L) 05/30/2023 1200   AST 18 05/30/2023 1200   AST 19 05/02/2022 1518   ALT 11 05/30/2023 1200   ALT 13 05/02/2022 1518   ALKPHOS 47 05/30/2023 1200   ALKPHOS 86 05/02/2022 1518   BILITOT <0.1 (L) 05/30/2023 1200   BILITOT 0.4 05/02/2022 1518   PROT 6.6 05/30/2023 1200   PROT 6.7 05/02/2022 1518   ALBUMIN 3.4 (L) 05/30/2023 1200   ALBUMIN 4.4 05/02/2022 1518    Studies/Results: PERIPHERAL VASCULAR CATHETERIZATION  Result Date: 06/06/2023 See surgical note for result.  DG OR LOCAL ABDOMEN  Result Date: 06/06/2023 CLINICAL DATA:  Elective surgery. Instrument counts are discrepant. A Kelly clamp is missing. EXAM: One-view abdomen. COMPARISON:  CT of the abdomen and pelvis 03/18/2023 FINDINGS: Surgical clips are present at the gallbladder fossa. Gastrostomy tube is in place. Surgical suture is noted. Drain is present in the lower abdomen. The side port of the gastric tube is in the upper esophagus, at least 20 cm from the GE junction. IMPRESSION: 1. Gastrostomy tube in place. 2. Drain in the lower abdomen. 3. No unexpected radiopaque foreign body. Electronically  Signed   By: Marin Roberts M.D.   On: 06/06/2023 11:22      Darnell Level 06/07/2023  Patient ID: Lindsay Frost, Lindsay Frost   DOB: Feb 29, 1956, 67 y.o.   MRN: [id number]

## 2023-06-08 MED ORDER — JEVITY 1.2 CAL PO LIQD
1000.0000 mL | ORAL | Status: DC
  Administered 2023-06-08: 1000 mL
  Filled 2023-06-08 (×3): qty 1000

## 2023-06-08 MED ORDER — ACETAMINOPHEN 10 MG/ML IV SOLN
1000.0000 mg | Freq: Four times a day (QID) | INTRAVENOUS | Status: AC
  Administered 2023-06-08 – 2023-06-09 (×4): 1000 mg via INTRAVENOUS
  Filled 2023-06-08 (×4): qty 100

## 2023-06-08 NOTE — Progress Notes (Signed)
Assessment & Plan: POD#2 - status post ex lap, distal gastrectomy with roux-en-y gastrojejunostomy, G-tube, J-tube - Dr. Andrey Campanile 06/06/2023 - ice chips, sips water - G-tube to drainage - start TF with Jevity at 20cc/hr today - add IV acetaminophen for pain control - Foley out, patient OOB        Darnell Level, MD [facility] Surgery A [facility] practice Office: [phone]        Chief Complaint: Gastric outlet obstruction  Subjective: Patient in bed, complains of abd pain at incision.  Taking limited po with water, ice.  Objective: Vital signs in last 24 hours: Temp:  [98.4 F (36.9 C)-99.4 F (37.4 C)] 99.4 F (37.4 C) (09/22 0740) Pulse Rate:  [85-96] 92 (09/22 0740) Resp:  [16-19] 16 (09/22 0740) BP: (118-160)/(72-102) 118/72 (09/22 0740) SpO2:  [99 %-100 %] 99 % (09/22 0740)    Intake/Output from previous day: 09/21 0701 - 09/22 0700 In: 2013.8 [I.V.:1703.8; IV Piggyback:250] Out: -  Intake/Output this shift: No intake/output data recorded.  Physical Exam: HEENT - sclerae clear, mucous membranes moist Abdomen - Gtube to drainage, small bilious; midline incision dry and intact Ext - no edema, non-tender  Lab Results:  Recent Labs    06/07/23 0459  WBC 12.9*  HGB 12.2  HCT 37.6  PLT 295   BMET Recent Labs    06/07/23 0459  NA 134*  K 3.6  CL 108  CO2 20*  GLUCOSE 131*  BUN 18  CREATININE 0.80  CALCIUM 7.6*   PT/INR No results for input(s): "LABPROT", "INR" in the last 72 hours. Comprehensive Metabolic Panel:    Component Value Date/Time   NA 134 (L) 06/07/2023 0459   NA 139 05/30/2023 1200   NA 144 04/13/2021 1544   NA 142 07/25/2020 1427   K 3.6 06/07/2023 0459   K 3.5 05/30/2023 1200   CL 108 06/07/2023 0459   CL 108 05/30/2023 1200   CO2 20 (L) 06/07/2023 0459   CO2 20 (L) 05/30/2023 1200   BUN 18 06/07/2023 0459   BUN 11 05/30/2023 1200   BUN 15 04/13/2021 1544   BUN 15 07/25/2020 1427   CREATININE 0.80 06/07/2023  0459   CREATININE 0.77 05/30/2023 1200   CREATININE 1.08 (H) 02/16/2021 1506   CREATININE 0.99 07/04/2020 1530   GLUCOSE 131 (H) 06/07/2023 0459   GLUCOSE 89 05/30/2023 1200   CALCIUM 7.6 (L) 06/07/2023 0459   CALCIUM 8.6 (L) 05/30/2023 1200   AST 18 05/30/2023 1200   AST 19 05/02/2022 1518   ALT 11 05/30/2023 1200   ALT 13 05/02/2022 1518   ALKPHOS 47 05/30/2023 1200   ALKPHOS 86 05/02/2022 1518   BILITOT <0.1 (L) 05/30/2023 1200   BILITOT 0.4 05/02/2022 1518   PROT 6.6 05/30/2023 1200   PROT 6.7 05/02/2022 1518   ALBUMIN 3.4 (L) 05/30/2023 1200   ALBUMIN 4.4 05/02/2022 1518    Studies/Results: PERIPHERAL VASCULAR CATHETERIZATION  Result Date: 06/06/2023 See surgical note for result.  DG OR LOCAL ABDOMEN  Result Date: 06/06/2023 CLINICAL DATA:  Elective surgery. Instrument counts are discrepant. A Kelly clamp is missing. EXAM: One-view abdomen. COMPARISON:  CT of the abdomen and pelvis 03/18/2023 FINDINGS: Surgical clips are present at the gallbladder fossa. Gastrostomy tube is in place. Surgical suture is noted. Drain is present in the lower abdomen. The side port of the gastric tube is in the upper esophagus, at least 20 cm from the GE junction. IMPRESSION: 1. Gastrostomy tube in place. 2.  Drain in the lower abdomen. 3. No unexpected radiopaque foreign body. Electronically Signed   By: Marin Roberts M.D.   On: 06/06/2023 11:22      Darnell Level 06/08/2023  Patient ID: Lindsay Frost, Lindsay Frost   DOB: Jan 18, 1956, 67 y.o.   MRN: [id number]

## 2023-06-09 LAB — SURGICAL PATHOLOGY

## 2023-06-09 LAB — C-REACTIVE PROTEIN: CRP: 3.5 mg/dL — ABNORMAL HIGH (ref <1.0)

## 2023-06-09 MED ORDER — METHOCARBAMOL 1000 MG/10ML IJ SOLN
500.0000 mg | Freq: Three times a day (TID) | INTRAVENOUS | Status: DC
  Administered 2023-06-09 – 2023-06-10 (×3): 500 mg via INTRAVENOUS
  Filled 2023-06-09 (×3): qty 500
  Filled 2023-06-09 (×2): qty 5

## 2023-06-09 MED ORDER — HYDROMORPHONE HCL 1 MG/ML IJ SOLN
0.5000 mg | INTRAMUSCULAR | Status: DC | PRN
  Administered 2023-06-09 – 2023-06-10 (×7): 1 mg via INTRAVENOUS
  Filled 2023-06-09 (×7): qty 1

## 2023-06-09 MED ORDER — ACETAMINOPHEN 10 MG/ML IV SOLN
1000.0000 mg | Freq: Four times a day (QID) | INTRAVENOUS | Status: DC
  Administered 2023-06-09 – 2023-06-10 (×3): 1000 mg via INTRAVENOUS
  Filled 2023-06-09 (×3): qty 100

## 2023-06-09 NOTE — Progress Notes (Signed)
Nutrition Follow-up  DOCUMENTATION CODES:   Severe malnutrition in context of chronic illness, Underweight  INTERVENTION:  Continue trickle TF via J-tube: Jevity 1.2 at 20 ml/h  Once able to advance TF per Surgery, recommend increasing by 10 ml every 12 hours to goal rate of 60 ml/h (1440 ml per day)   Provides 1728 kcal, 80 gm protein, 1166 ml free water daily  Monitor magnesium, potassium, and phosphorus BID for at least 5 days, MD to replete as needed, as pt is at risk for refeeding syndrome   Check vitamin/micronutrient labs for possible deficiencies: Vitamin C, Vitamin D, Vitamin B12, zinc, CRP   When enteral nutrition is tolerated, will need to begin bariatric vitamin supplementation: MVI x 2 (can be crushed and given via tube) and Calcium carbonate 500 mg TID.  NUTRITION DIAGNOSIS:   Severe Malnutrition related to chronic illness (PUD, gastroparesis, esophagitis, duodenal bulb stenosis) as evidenced by severe fat depletion, severe muscle depletion. - diagnosis updated 9/23  GOAL:   Patient will meet greater than or equal to 90% of their needs - unmet, addressing via TF  MONITOR:   TF tolerance, Labs, Weight trends  REASON FOR ASSESSMENT:   Consult Enteral/tube feeding initiation and management  ASSESSMENT:   67 yo female admitted with GOO d/t chronic PUD. S/P distal gastrectomy with roux en y gastrojejunostomy and placement of gastric and jejunal feeding tubes 9/20. PMH includes PUD, gastroparesis, duodenal diverticulum, duodenal bulb stenosis, esophagitis, GERD, hiatal hernia, gallstones, meningioma, HTN, seizures, B-12 deficiency.  9/20: s/p exlap, distal gastrectomy with roux-en-y gastrojejunostomy; GJ tube placement 9/22: TF initiated  Spoke with pt at bedside. TF infusing via Jtube at 47ml/hr. Pt endorses nausea however this has been ongoing since surgery, prior to initiation of tube feed. Denies nausea. Abdomen slightly distended and taut. No BM since  admission, therefore Surgery recommends continuing with trickle TF.  Pt states that she has had lots of GI difficulties. She reports that she has had gastroparesis x2 years which has limited her intake. She experiences early satiety and emesis about 2 times per week. She drinks Boost 3 times per day and eats about 4 small meals daily including soups and hamburgers.   Pt states that she has had difficulty gaining weight but reports that she has been able to maintain her weight recently. Per review of weight history, pt's weight has increased from 45.4 kg in April, up to 47 kg and appears to have remained stable over the last 2 months.   She does not take any vitamins at home but mentions that she had been on Vitamin E d/t low labs however this was d/c about 1 month ago. Denies awareness to other vitamin/micronutrient lab draws recently. D/t increased risk of vitamin/micronutrient deficiencies with recent gastrectomy/roux-en-y procedure, pt would benefit from labs being checked to r/o prior deficiencies. Pt in agreement.   Medications: protonix Drips: D5 in NaCl with KCl @ 22ml/hr  Labs: sodium 134, Ca 7.6   NUTRITION - FOCUSED PHYSICAL EXAM:  Flowsheet Row Most Recent Value  Orbital Region Severe depletion  Upper Arm Region Severe depletion  Thoracic and Lumbar Region Severe depletion  Buccal Region Severe depletion  Temple Region Severe depletion  Clavicle Bone Region Severe depletion  Clavicle and Acromion Bone Region Severe depletion  Scapular Bone Region Severe depletion  Dorsal Hand Severe depletion  Patellar Region Severe depletion  Anterior Thigh Region Severe depletion  Posterior Calf Region Severe depletion  Edema (RD Assessment) None  Hair Reviewed  Eyes  Reviewed  Mouth Reviewed  Skin Reviewed  Nails Reviewed       Diet Order:   Diet Order             Diet NPO time specified Except for: Ice Chips, Other (See Comments)  Diet effective now                    EDUCATION NEEDS:   Education needs have been addressed  Skin:  Skin Assessment: Skin Integrity Issues: Skin Integrity Issues:: Incisions Incisions: surgical incision to abdomen  Last BM:  PTA  Height:   Ht Readings from Last 1 Encounters:  06/06/23 5\' 4"  (1.626 m)    Weight:   Wt Readings from Last 1 Encounters:  06/06/23 47.6 kg    Ideal Body Weight:  54.5 kg  BMI:  Body mass index is 18.02 kg/m.  Estimated Nutritional Needs:   Kcal:  1650-1850  Protein:  70-90 gm  Fluid:  1.7-1.9 L  Drusilla Kanner, RDN, LDN Clinical Nutrition

## 2023-06-09 NOTE — Care Management Important Message (Signed)
Important Message  Patient Details  Name: Lindsay Frost MRN: [id number] Date of Birth: 1956-03-07   Important Message Given:  Yes - Medicare IM     Dorena Bodo 06/09/2023, 3:51 PM

## 2023-06-09 NOTE — Progress Notes (Signed)
    Assessment & Plan: POD#3 - status post ex lap, distal gastrectomy with roux-en-y gastrojejunostomy, G-tube, J-tube - Dr. Andrey Campanile 06/06/2023 - ice chips, sips water - G-tube to drainage - TF with Jevity at 20cc/hr. Don't advance given lack of bowel function - IV acetaminophen for pain control again - Foley out, patient OOB         Maudry Diego, MD, FACS, FSSO Surgical Oncology, General Surgery, Trauma and Critical [facility] Surgery, [place] [phone] for weekday/non holidays Check [email or web address] for coverage night/weekend/holidays          Chief Complaint: Gastric outlet obstruction  Subjective: Complains that pain medicine only lasts around 30 min  Objective: Vital signs in last 24 hours: Temp:  [98 F (36.7 C)-99.9 F (37.7 C)] 98.9 F (37.2 C) (09/23 0734) Pulse Rate:  [81-91] 83 (09/23 0734) Resp:  [16-18] 16 (09/23 0734) BP: (103-134)/(69-88) 103/69 (09/23 0734) SpO2:  [98 %-99 %] 98 % (09/23 0734)    Intake/Output from previous day: 09/22 0701 - 09/23 0700 In: 435.3 [NG/GT:235.3; IV Piggyback:200] Out: -  Intake/Output this shift: No intake/output data recorded.  Physical Exam: HEENT - sclerae clear, mucous membranes moist Abdomen - Gtube to drainage, small bilious; midline incision dry and intact Ext - no edema, non-tender  Lab Results:  Recent Labs    06/07/23 0459  WBC 12.9*  HGB 12.2  HCT 37.6  PLT 295   BMET Recent Labs    06/07/23 0459  NA 134*  K 3.6  CL 108  CO2 20*  GLUCOSE 131*  BUN 18  CREATININE 0.80  CALCIUM 7.6*   PT/INR No results for input(s): "LABPROT", "INR" in the last 72 hours. Comprehensive Metabolic Panel:    Component Value Date/Time   NA 134 (L) 06/07/2023 0459   NA 139 05/30/2023 1200   NA 144 04/13/2021 1544   NA 142 07/25/2020 1427   K 3.6 06/07/2023 0459   K 3.5 05/30/2023 1200   CL 108 06/07/2023 0459   CL 108 05/30/2023 1200   CO2 20 (L) 06/07/2023 0459   CO2 20 (L) 05/30/2023 1200   BUN  18 06/07/2023 0459   BUN 11 05/30/2023 1200   BUN 15 04/13/2021 1544   BUN 15 07/25/2020 1427   CREATININE 0.80 06/07/2023 0459   CREATININE 0.77 05/30/2023 1200   CREATININE 1.08 (H) 02/16/2021 1506   CREATININE 0.99 07/04/2020 1530   GLUCOSE 131 (H) 06/07/2023 0459   GLUCOSE 89 05/30/2023 1200   CALCIUM 7.6 (L) 06/07/2023 0459   CALCIUM 8.6 (L) 05/30/2023 1200   AST 18 05/30/2023 1200   AST 19 05/02/2022 1518   ALT 11 05/30/2023 1200   ALT 13 05/02/2022 1518   ALKPHOS 47 05/30/2023 1200   ALKPHOS 86 05/02/2022 1518   BILITOT <0.1 (L) 05/30/2023 1200   BILITOT 0.4 05/02/2022 1518   PROT 6.6 05/30/2023 1200   PROT 6.7 05/02/2022 1518   ALBUMIN 3.4 (L) 05/30/2023 1200   ALBUMIN 4.4 05/02/2022 1518    Studies/Results: No results found.    Almond Lint 06/09/2023

## 2023-06-10 DIAGNOSIS — E43 Unspecified severe protein-calorie malnutrition: Secondary | ICD-10-CM | POA: Insufficient documentation

## 2023-06-10 LAB — BASIC METABOLIC PANEL
Anion gap: 8 (ref 5–15)
BUN: 7 mg/dL — ABNORMAL LOW (ref 8–23)
CO2: 16 mmol/L — ABNORMAL LOW (ref 22–32)
Calcium: 8.2 mg/dL — ABNORMAL LOW (ref 8.9–10.3)
Chloride: 113 mmol/L — ABNORMAL HIGH (ref 98–111)
Creatinine, Ser: 0.51 mg/dL (ref 0.44–1.00)
GFR, Estimated: >60 mL/min (ref >60)
Glucose, Bld: 117 mg/dL — ABNORMAL HIGH (ref 70–99)
Potassium: 3.4 mmol/L — ABNORMAL LOW (ref 3.5–5.1)
Sodium: 137 mmol/L (ref 135–145)

## 2023-06-10 LAB — CBC
HCT: 32.6 % — ABNORMAL LOW (ref 36.0–46.0)
Hemoglobin: 10.5 g/dL — ABNORMAL LOW (ref 12.0–15.0)
MCH: 29.8 pg (ref 26.0–34.0)
MCHC: 32.2 g/dL (ref 30.0–36.0)
MCV: 92.6 fL (ref 80.0–100.0)
Platelets: 273 10*3/uL (ref 150–400)
RBC: 3.52 MIL/uL — ABNORMAL LOW (ref 3.87–5.11)
RDW: 13.6 % (ref 11.5–15.5)
WBC: 8.4 10*3/uL (ref 4.0–10.5)
nRBC: 0 % (ref 0.0–0.2)

## 2023-06-10 LAB — MAGNESIUM
Magnesium: 1.9 mg/dL (ref 1.7–2.4)
Magnesium: 1.8 mg/dL (ref 1.7–2.4)

## 2023-06-10 LAB — VITAMIN B12: Vitamin B-12: 745 pg/mL (ref 180–914)

## 2023-06-10 LAB — VITAMIN D 25 HYDROXY (VIT D DEFICIENCY, FRACTURES): Vit D, 25-Hydroxy: 26.41 ng/mL — ABNORMAL LOW (ref 30–100)

## 2023-06-10 LAB — PHOSPHORUS: Phosphorus: 1.7 mg/dL — ABNORMAL LOW (ref 2.5–4.6)

## 2023-06-10 MED ORDER — METHOCARBAMOL 1000 MG/10ML IJ SOLN
500.0000 mg | Freq: Four times a day (QID) | INTRAVENOUS | Status: DC
  Administered 2023-06-10 – 2023-06-11 (×4): 500 mg via INTRAVENOUS
  Filled 2023-06-10: qty 5
  Filled 2023-06-10 (×2): qty 500
  Filled 2023-06-10 (×2): qty 5
  Filled 2023-06-10 (×2): qty 500

## 2023-06-10 MED ORDER — POTASSIUM PHOSPHATES 15 MMOLE/5ML IV SOLN
30.0000 mmol | Freq: Once | INTRAVENOUS | Status: AC
  Administered 2023-06-10: 30 mmol via INTRAVENOUS
  Filled 2023-06-10: qty 10

## 2023-06-10 MED ORDER — POTASSIUM CHLORIDE 2 MEQ/ML IV SOLN
INTRAVENOUS | Status: DC
  Filled 2023-06-10 (×11): qty 1000

## 2023-06-10 MED ORDER — HYDROMORPHONE 1 MG/ML IV SOLN
INTRAVENOUS | Status: DC
  Filled 2023-06-10: qty 30

## 2023-06-10 MED ORDER — HYDROMORPHONE HCL 1 MG/ML IJ SOLN
0.5000 mg | INTRAMUSCULAR | Status: DC | PRN
  Administered 2023-06-10 – 2023-06-19 (×44): 1 mg via INTRAVENOUS
  Filled 2023-06-10 (×46): qty 1

## 2023-06-10 MED ORDER — PROCHLORPERAZINE EDISYLATE 10 MG/2ML IJ SOLN
10.0000 mg | Freq: Four times a day (QID) | INTRAMUSCULAR | Status: DC | PRN
  Administered 2023-06-10 – 2023-06-29 (×7): 10 mg via INTRAVENOUS
  Filled 2023-06-10 (×7): qty 2

## 2023-06-10 MED ORDER — ACETAMINOPHEN 10 MG/ML IV SOLN
1000.0000 mg | Freq: Four times a day (QID) | INTRAVENOUS | Status: AC
  Administered 2023-06-10 – 2023-06-11 (×3): 1000 mg via INTRAVENOUS
  Filled 2023-06-10 (×4): qty 100

## 2023-06-10 MED ORDER — SODIUM CHLORIDE 0.9% FLUSH: 9.0000 mL | INTRAVENOUS | Status: DC | PRN

## 2023-06-10 MED ORDER — METOCLOPRAMIDE HCL 5 MG/ML IJ SOLN
5.0000 mg | Freq: Four times a day (QID) | INTRAMUSCULAR | Status: DC
  Administered 2023-06-10 – 2023-06-25 (×60): 5 mg via INTRAVENOUS
  Filled 2023-06-10 (×61): qty 2

## 2023-06-10 MED ORDER — NALOXONE HCL 0.4 MG/ML IJ SOLN: 0.4000 mg | INTRAMUSCULAR | Status: DC | PRN

## 2023-06-10 NOTE — Progress Notes (Signed)
Patient ID: Lindsay Frost, female   DOB: August 24, 1956, 67 y.o.   MRN: [id number] [facility] Surgery Progress Note  4 Days Post-Op  Subjective: CC-  Still having a lot of abdominal pain. Medication adjustments yesterday did not make a big difference. Continues to have nausea and states that she is vomiting. She has recently had zofran, phenergan, and compazine with no relief. G tube to gravity with 200cc out last 24 hours. No flatus or BM. Has only gotten OOB to go to the bedside commode.   Objective: Vital signs in last 24 hours: Temp:  [97.5 F (36.4 C)-98.5 F (36.9 C)] 98.3 F (36.8 C) (09/24 0735) Pulse Rate:  [78-86] 78 (09/24 0735) Resp:  [16-20] 18 (09/24 0458) BP: (120-140)/(70-82) 120/70 (09/24 0735) SpO2:  [97 %-99 %] 98 % (09/24 0735) Last BM Date : 06/09/23  Intake/Output from previous day: 09/23 0701 - 09/24 0700 In: 2436.5 [I.V.:1852.5; IV Piggyback:360] Out: 1750 [Urine:1550; Stool:200] Intake/Output this shift: Total I/O In: 161.2 [I.V.:61.2; IV Piggyback:100] Out: -   PE: Gen:  Alert, NAD Pulm:  rate and effort normal Abd: soft, nondistended, appropriately tender, midline cdi with honeycomb dressing in place and small amount of dried blood, G tube to gravity drainage, J tube with TF running at 20cc/hr  Lab Results:  Recent Labs    06/10/23 0419  WBC 8.4  HGB 10.5*  HCT 32.6*  PLT 273   BMET Recent Labs    06/10/23 0419  NA 137  K 3.4*  CL 113*  CO2 16*  GLUCOSE 117*  BUN 7*  CREATININE 0.51  CALCIUM 8.2*   PT/INR No results for input(s): "LABPROT", "INR" in the last 72 hours. CMP     Component Value Date/Time   NA 137 06/10/2023 0419   NA 144 04/13/2021 1544   K 3.4 (L) 06/10/2023 0419   CL 113 (H) 06/10/2023 0419   CO2 16 (L) 06/10/2023 0419   GLUCOSE 117 (H) 06/10/2023 0419   BUN 7 (L) 06/10/2023 0419   BUN 15 04/13/2021 1544   CREATININE 0.51 06/10/2023 0419   CREATININE 1.08 (H) 02/16/2021 1506   CALCIUM 8.2 (L)  06/10/2023 0419   PROT 6.6 05/30/2023 1200   ALBUMIN 3.4 (L) 05/30/2023 1200   AST 18 05/30/2023 1200   ALT 11 05/30/2023 1200   ALKPHOS 47 05/30/2023 1200   BILITOT <0.1 (L) 05/30/2023 1200   GFRNONAA >60 06/10/2023 0419   GFRNONAA >89 01/17/2016 1424   GFRAA 67 07/25/2020 1427   GFRAA >89 01/17/2016 1424   Lipase     Component Value Date/Time   LIPASE 24 02/16/2021 1506       Studies/Results: No results found.  Anti-infectives: Anti-infectives (From admission, onward)    Start     Dose/Rate Route Frequency Ordered Stop   06/06/23 0600  cefoTEtan (CEFOTAN) 2 g in sodium chloride 0.9 % 100 mL IVPB        2 g 200 mL/hr over 30 Minutes Intravenous On call to O.R. 06/06/23 0555 06/06/23 0818        Assessment/Plan Gastric outlet obstruction due to chronic peptic ulcer disease  -POD#4 s/p Exploratory laparotomy, Distal gastrectomy with Roux-en-Y gastrojejunostomy, Placement of 24-French gastric tube, Placement of 18-French jejunostomy tube 9/20 Dr. Andrey Campanile - surgical path negative for H pylori and negative for malignancy - Continue IV tylenol and increase IV robaxin to q6 hr. Transition to PCA today - check EKG given antiemetic use. Schedule Reglan - Replace K and Phos. Repeat  labs in AM - mobilize, will ask PT to see - continue TF with Jevity at 20cc/hr. Don't advance given lack of bowel function. - G-tube to gravity drainage  ID - cefotetan periop FEN - IVF, NPO except sips water/ ice chips, TF at 20cc/hr per J tube VTE - lovenox Foley - out and voiding     LOS: 4 days    Franne Forts, [facility] Surgery 06/10/2023, 9:18 AM Please see Amion for pager number during day hours 7:00am-4:30pm

## 2023-06-10 NOTE — Progress Notes (Signed)
Patient still having some nausea after PRN zofran and phenergan administration. Patient requesting for more nausea med. Tube feeding held. Lindsay Gelinas, MD was notified and made aware.   [name]

## 2023-06-10 NOTE — Progress Notes (Signed)
Since admission pt has brought heating pad from home. Heating pad is always on high directly on her skin. Daughter stated that she always has the heating pad on. Skin on pt back is red. Pt stated that the redness is old and from the heating pad. Surgery PA notified. Lowered the temperature of the heating pad. Pt refused to put towel between pad and skin.

## 2023-06-10 NOTE — Plan of Care (Signed)

## 2023-06-11 ENCOUNTER — Inpatient Hospital Stay (HOSPITAL_COMMUNITY): Payer: 59

## 2023-06-11 LAB — CBC
HCT: 35 % — ABNORMAL LOW (ref 36.0–46.0)
Hemoglobin: 11.7 g/dL — ABNORMAL LOW (ref 12.0–15.0)
MCH: 30.8 pg (ref 26.0–34.0)
MCHC: 33.4 g/dL (ref 30.0–36.0)
MCV: 92.1 fL (ref 80.0–100.0)
Platelets: 385 10*3/uL (ref 150–400)
RBC: 3.8 MIL/uL — ABNORMAL LOW (ref 3.87–5.11)
RDW: 13.6 % (ref 11.5–15.5)
WBC: 9.8 10*3/uL (ref 4.0–10.5)
nRBC: 0 % (ref 0.0–0.2)

## 2023-06-11 LAB — BASIC METABOLIC PANEL
Anion gap: 9 (ref 5–15)
BUN: 10 mg/dL (ref 8–23)
CO2: 19 mmol/L — ABNORMAL LOW (ref 22–32)
Calcium: 8.3 mg/dL — ABNORMAL LOW (ref 8.9–10.3)
Chloride: 107 mmol/L (ref 98–111)
Creatinine, Ser: 0.66 mg/dL (ref 0.44–1.00)
GFR, Estimated: >60 mL/min (ref >60)
Glucose, Bld: 130 mg/dL — ABNORMAL HIGH (ref 70–99)
Potassium: 3.5 mmol/L (ref 3.5–5.1)
Sodium: 135 mmol/L (ref 135–145)

## 2023-06-11 LAB — MAGNESIUM
Magnesium: 1.8 mg/dL (ref 1.7–2.4)
Magnesium: 1.9 mg/dL (ref 1.7–2.4)

## 2023-06-11 LAB — ZINC: Zinc: 64 ug/dL (ref 44–115)

## 2023-06-11 LAB — PHOSPHORUS: Phosphorus: 2.4 mg/dL — ABNORMAL LOW (ref 2.5–4.6)

## 2023-06-11 MED ORDER — METHOCARBAMOL 1000 MG/10ML IJ SOLN
1000.0000 mg | Freq: Four times a day (QID) | INTRAVENOUS | Status: DC
  Administered 2023-06-11 – 2023-07-04 (×88): 1000 mg via INTRAVENOUS
  Filled 2023-06-11 (×5): qty 1000
  Filled 2023-06-11 (×3): qty 10
  Filled 2023-06-11 (×9): qty 1000
  Filled 2023-06-11: qty 10
  Filled 2023-06-11 (×4): qty 1000
  Filled 2023-06-11: qty 10
  Filled 2023-06-11 (×4): qty 1000
  Filled 2023-06-11: qty 10
  Filled 2023-06-11 (×8): qty 1000
  Filled 2023-06-11: qty 10
  Filled 2023-06-11 (×2): qty 1000
  Filled 2023-06-11: qty 10
  Filled 2023-06-11: qty 1000
  Filled 2023-06-11 (×4): qty 10
  Filled 2023-06-11: qty 1000
  Filled 2023-06-11: qty 10
  Filled 2023-06-11 (×3): qty 1000
  Filled 2023-06-11 (×2): qty 10
  Filled 2023-06-11: qty 1000
  Filled 2023-06-11: qty 10
  Filled 2023-06-11 (×5): qty 1000
  Filled 2023-06-11: qty 10
  Filled 2023-06-11 (×2): qty 1000
  Filled 2023-06-11: qty 10
  Filled 2023-06-11 (×5): qty 1000
  Filled 2023-06-11: qty 10
  Filled 2023-06-11 (×3): qty 1000
  Filled 2023-06-11: qty 10
  Filled 2023-06-11: qty 1000
  Filled 2023-06-11: qty 10
  Filled 2023-06-11 (×3): qty 1000
  Filled 2023-06-11: qty 10
  Filled 2023-06-11 (×2): qty 1000
  Filled 2023-06-11: qty 10
  Filled 2023-06-11 (×4): qty 1000
  Filled 2023-06-11: qty 10
  Filled 2023-06-11 (×2): qty 1000
  Filled 2023-06-11 (×2): qty 10
  Filled 2023-06-11 (×4): qty 1000

## 2023-06-11 MED ORDER — ACETAMINOPHEN 10 MG/ML IV SOLN
1000.0000 mg | Freq: Four times a day (QID) | INTRAVENOUS | Status: AC
  Administered 2023-06-11 – 2023-06-12 (×4): 1000 mg via INTRAVENOUS
  Filled 2023-06-11 (×3): qty 100

## 2023-06-11 MED ORDER — OSMOLITE 1.2 CAL PO LIQD
1000.0000 mL | ORAL | Status: DC
  Administered 2023-06-11: 1000 mL
  Filled 2023-06-11 (×2): qty 1000

## 2023-06-11 MED ORDER — HYDRALAZINE HCL 20 MG/ML IJ SOLN: 10.0000 mg | Freq: Three times a day (TID) | INTRAMUSCULAR | Status: DC | PRN

## 2023-06-11 NOTE — TOC Initial Note (Addendum)
Transition of Care (TOC) - Initial/Assessment Note   Spoke to patient at bedside. Patient from home with sister .   If patient needs tube feedings at home , TF will be ordered through Marchelle Folks from [place] will provide teaching to the patient and sister . Patient will have a HHRN however insurance does not cover daily visits. Patient voices understanding .   PT recommended bedside commode. Patient in agreement . Will order closer to discharge   TF via J tube , - G-tube to gravity drainage  Discussed with CCS PA : may not need home TF if  anastomosis opens up and she is able to tolerate PO   TOC will continue to follow  Patient Details  Name: Lindsay Frost MRN: [id number] Date of Birth: [birth year]  Transition of Care [facility]) CM/SW Contact:    Kingsley Plan, RN Phone Number: 06/11/2023, 1:31 PM  Clinical Narrative:                   Expected Discharge Plan: Home w Home Health Services Barriers to Discharge: Continued Medical Work up   Patient Goals and CMS Choice Patient states their goals for this hospitalization and ongoing recovery are:: to return home CMS Medicare.gov Compare Post Acute Care list provided to:: Patient Choice offered to / list presented to : Patient [place] ownership interest in [facility].provided to:: Patient    Expected Discharge Plan and Services   Discharge Planning Services: CM Consult Post Acute Care Choice: Home Health, Durable Medical Equipment Living arrangements for the past 2 months: Apartment                 DME Arranged: 3-N-1                    Prior Living Arrangements/Services Living arrangements for the past 2 months: Apartment Lives with:: Siblings Patient language and need for interpreter reviewed:: Yes Do you feel safe going back to the place where you live?: Yes      Need for Family Participation in Patient Care: Yes (Comment) Care giver support system in place?: Yes (comment)   Criminal  Activity/Legal Involvement Pertinent to Current Situation/Hospitalization: No - Comment as needed  Activities of Daily Living      Permission Sought/Granted   Permission granted to share information with : No              Emotional Assessment Appearance:: Appears stated age Attitude/Demeanor/Rapport: Engaged Affect (typically observed): Accepting Orientation: : Oriented to Self, Oriented to Place, Oriented to  Time, Oriented to Situation Alcohol / Substance Use: Not Applicable Psych Involvement: No (comment)  Admission diagnosis:  S/P partial gastrectomy [Z90.3] Patient Active Problem List   Diagnosis Date Noted   Protein-calorie malnutrition, severe 06/10/2023   S/P partial gastrectomy 06/06/2023   Cavitary lung disease 03/26/2023   Cough 02/18/2023   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, 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, 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 12/30/2022   Atherosclerotic heart disease of native coronary artery without angina pectoris 12/30/2022   Mixed conductive and sensorineural hearing loss of left ear with restricted hearing of right ear 10/10/2022   Recurrent serous otitis media of left ear 10/10/2022   Vertigo 10/10/2022   Bilateral hearing loss 07/15/2022   Headache 04/13/2021   Myringotomy tube status 02/27/2021   B12 deficiency 01/02/2021   Overactive bladder 01/02/2021   Angina pectoris (HCC) 07/25/2020   Mixed dyslipidemia 07/25/2020   Arthritis    Back pain    Heart  murmur    Meningioma (HCC)    Duodenal stenosis    Hiatal hernia    Hypertension 05/14/2019   Weight loss 07/29/2018   Deviated septum 08/28/2016   Allergic rhinitis 07/16/2016   Nocturnal leg cramps 02/20/2015   Migraine without aura and without status migrainosus, not intractable 01/13/2015   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 [place] [address] Phone: [phone] Fax: [phone]     Social Determinants of Health (SDOH) Social History: SDOH Screenings   Food Insecurity: No Food Insecurity (02/17/2023)  Housing: Low Risk  (02/17/2023)  Transportation Needs: No Transportation Needs (02/17/2023)  Utilities: Not At Risk (11/13/2022)  Alcohol Screen: Low Risk  (11/13/2022)  Depression (PHQ2-9): Low Risk  (02/18/2023)  Financial Resource Strain: Low Risk  (02/17/2023)  Physical Activity: Insufficiently Active (02/17/2023)  Social Connections: Socially Isolated (02/17/2023)  Stress: No Stress Concern Present (02/17/2023)  Tobacco Use: Low Risk  (06/06/2023)   SDOH Interventions:     Readmission Risk Interventions     No data to display

## 2023-06-11 NOTE — Progress Notes (Addendum)
Patient ID: Lindsay Frost, female   DOB: 1955/10/04, 67 y.o.   MRN: [id number] [facility] Surgery Progress Note  5 Days Post-Op  Subjective: CC-  Seen with RN.   Up to the chair with therapies this am.   Reports reflux and nausea yesterday. When getting up in bed overnight she began vomiting. Reports 3 episode of emesis this am that is dark and resembled the same fluid/color as what is in her G-tube gravity bag. She is still nauseated. On reglan. Has been taking Zofran, Phenergan and Compazine for her nausea. TF's running at 20cc/hr. Her G-tube is currently to gravity w/ 450cc bilious output in gravity bag. I/O with 100cc total out yesterday. RN confirms they are flushing G-tube.   She denies any abdominal pain to me between yesterday or today. Reports the pain medication she is taking is for chronic back pain and pain from her arthritis. She had 2 formed bm's over the last 24 hours that was without melena or hematochezia. Voiding without issues.   Objective: Vital signs in last 24 hours: Temp:  [97.5 F (36.4 C)-98.6 F (37 C)] 98.3 F (36.8 C) (09/25 0744) Pulse Rate:  [86-95] 89 (09/25 0744) Resp:  [16-19] 16 (09/25 0744) BP: (113-148)/(61-85) 142/61 (09/25 0744) SpO2:  [97 %-100 %] 100 % (09/25 0744) Last BM Date : 06/10/23  Intake/Output from previous day: 09/24 0701 - 09/25 0700 In: 2218.5 [I.V.:729.2; NG/GT:1029.3; IV Piggyback:400] Out: 100 [Drains:100] Intake/Output this shift: No intake/output data recorded.  PE: Gen:  Alert, NAD Card: Reg Pulm:  CTA b/l Abd: Soft, at least mild distension, generalized ttp without rigidity or guarding Midline cdi with staples in place. G tube to gravity with bilious output in gravity bag. J tube with TF running at 20cc/hr  Lab Results:  Recent Labs    06/10/23 0419 06/11/23 0550  WBC 8.4 9.8  HGB 10.5* 11.7*  HCT 32.6* 35.0*  PLT 273 385   BMET Recent Labs    06/10/23 0419 06/11/23 0550  NA 137 135  K 3.4* 3.5   CL 113* 107  CO2 16* 19*  GLUCOSE 117* 130*  BUN 7* 10  CREATININE 0.51 0.66  CALCIUM 8.2* 8.3*   PT/INR No results for input(s): "LABPROT", "INR" in the last 72 hours. CMP     Component Value Date/Time   NA 135 06/11/2023 0550   NA 144 04/13/2021 1544   K 3.5 06/11/2023 0550   CL 107 06/11/2023 0550   CO2 19 (L) 06/11/2023 0550   GLUCOSE 130 (H) 06/11/2023 0550   BUN 10 06/11/2023 0550   BUN 15 04/13/2021 1544   CREATININE 0.66 06/11/2023 0550   CREATININE 1.08 (H) 02/16/2021 1506   CALCIUM 8.3 (L) 06/11/2023 0550   PROT 6.6 05/30/2023 1200   ALBUMIN 3.4 (L) 05/30/2023 1200   AST 18 05/30/2023 1200   ALT 11 05/30/2023 1200   ALKPHOS 47 05/30/2023 1200   BILITOT <0.1 (L) 05/30/2023 1200   GFRNONAA >60 06/11/2023 0550   GFRNONAA >89 01/17/2016 1424   GFRAA 67 07/25/2020 1427   GFRAA >89 01/17/2016 1424   Lipase     Component Value Date/Time   LIPASE 24 02/16/2021 1506       Studies/Results: No results found.  Anti-infectives: Anti-infectives (From admission, onward)    Start     Dose/Rate Route Frequency Ordered Stop   06/06/23 0600  cefoTEtan (CEFOTAN) 2 g in sodium chloride 0.9 % 100 mL IVPB  2 g 200 mL/hr over 30 Minutes Intravenous On call to O.R. 06/06/23 0555 06/06/23 0818        Assessment/Plan Gastric outlet obstruction due to chronic peptic ulcer disease  -POD#5 s/p Exploratory laparotomy, Distal gastrectomy with Roux-en-Y gastrojejunostomy, Placement of 24-French gastric tube, Placement of 18-French jejunostomy tube 9/20 Dr. Andrey Campanile - Surgical path negative for H pylori and negative for malignancy - Continue IV tylenol and increase IV robaxin. Offered PCA 9/24. Cont PRN Dilaudid. Avoid NSAIDs - Cont G-tube to gravity drainage, strict I/O, increase flushes to Q4hrs. Xray pending. If any further n/v will place G-tube to LIWS - Scheduled Reglan. Continue PRN anti-emetics. QT/QTc 384/437 ms 9/24 - Discussed with attending. As J-tube is  distal, emesis is not c/w TF's and patient is having bowel function will slowly increase TF's today.  - Cont to monitor electrolytes.  - Mobilize, PT  ID - Cefotetan periop. None currently. Afebrile. No tachycardia or hypotension. WBC wnl.  FEN - IVF, NPO except sips water/ ice chips, increase TF's to 30cc/hr.  VTE - SCDs, Lovenox Foley - out and voiding  HTN - PRN meds while NPO Hx Seizures - Home meds   LOS: 5 days    Jacinto Halim, [facility] Surgery 06/11/2023, 9:39 AM Please see Amion for pager number during day hours 7:00am-4:30pm

## 2023-06-11 NOTE — Progress Notes (Signed)
Pt vomiting this morning. It is a dark brown color. made [place], [place] aware. Someone will be up to see the patient soon.

## 2023-06-11 NOTE — Progress Notes (Signed)
Tube feeds held per PA

## 2023-06-11 NOTE — Evaluation (Signed)
Physical Therapy Evaluation Patient Details Name: Lindsay Frost MRN: [id number] DOB: Oct 31, 1955 Today's Date: 06/11/2023  History of Present Illness  67 y.o. 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 PMH: back pain, cholecystectomy, gastric ulcer, depression.  Clinical Impression   Pt living with sister in single level home with no steps to enter. Reports independence without AD prior to surgery. Pt is limited in safe mobility by generalized weakness, and associated decreased endurance and balance. Pt also has decreased safety awareness as she was found covered in vomit and wrapped up in her Cortrak, IV and g-tube pouch and seemed oblivious to situation. Pt is mod I for mobility with balance deficits. PT will continue to work with her acutely to improve higher level balance and endurance, but will have no PT needs at discharge. Will refer to Mobility Specialist for increased mobilization.       If plan is discharge home, recommend the following: A little help with walking and/or transfers;A little help with bathing/dressing/bathroom;Assistance with cooking/housework;Assist for transportation   Can travel by private vehicle    Yes    Equipment Recommendations BSC/3in1     Functional Status Assessment Patient has had a recent decline in their functional status and demonstrates the ability to make significant improvements in function in a reasonable and predictable amount of time.     Precautions / Restrictions Precautions Precautions: None Restrictions Weight Bearing Restrictions: No      Mobility  Bed Mobility Overal bed mobility: Modified Independent             General bed mobility comments: increased time and effort, use of bed rails to come to EoB    Transfers Overall transfer level:  Modified independent Equipment used: None               General transfer comment: good power up and self steadying    Ambulation/Gait Ambulation/Gait assistance: Modified independent (Device/Increase time) Gait Distance (Feet): 20 Feet Assistive device: None Gait Pattern/deviations: WFL(Within Functional Limits), Step-through pattern, Drifts right/left Gait velocity: slowed Gait velocity interpretation: <1.31 ft/sec, indicative of household ambulator   General Gait Details: mildly antalgic, drifting gait in room, increased effort noted and decreased endurance        Balance Overall balance assessment: Mild deficits observed, not formally tested                                           Pertinent Vitals/Pain Pain Assessment Pain Assessment: Faces Faces Pain Scale: Hurts a little bit Pain Location: abdomen with movement Pain Descriptors / Indicators: Grimacing Pain Intervention(s): Limited activity within patient's tolerance, Monitored during session, Repositioned    Home Living Family/patient expects to be discharged to:: Private residence Living Arrangements:  (sister) Available Help at Discharge: Family;Available PRN/intermittently Type of Home: House Home Access: Level entry       Home Layout: One level Home Equipment: None      Prior Function Prior Level of Function : Independent/Modified Independent             Mobility Comments: able to ambulate community distances without AD ADLs Comments: independent     Extremity/Trunk Assessment   Upper Extremity Assessment Upper Extremity Assessment: Defer to OT evaluation    Lower Extremity Assessment Lower  Extremity Assessment: Overall WFL for tasks assessed    Cervical / Trunk Assessment Cervical / Trunk Assessment: Kyphotic (cachetic)  Communication   Communication Communication: No apparent difficulties Cueing Techniques: Verbal cues;Tactile cues;Visual cues  Cognition  Arousal: Alert Behavior During Therapy: WFL for tasks assessed/performed, Flat affect Overall Cognitive Status: Impaired/Different from baseline Area of Impairment: Awareness, Following commands, Safety/judgement, Problem solving                       Following Commands: Follows one step commands with increased time, Follows multi-step commands with increased time Safety/Judgement: Decreased awareness of safety Awareness: Anticipatory Problem Solving: Requires verbal cues, Requires tactile cues General Comments: on entry pt getting back in bed, vomit all over bed, floor, gown, and socks. Pt with cortrak and IV lines wrapped around her legs and gastric empting pouch, seemingly unaware of her predicament        General Comments General comments (skin integrity, edema, etc.): pt covered in vomit, aware but not concerned.        Assessment/Plan    PT Assessment Patient needs continued PT services  PT Problem List Decreased strength;Decreased activity tolerance;Decreased balance;Decreased mobility;Decreased safety awareness;Pain       PT Treatment Interventions DME instruction;Gait training;Functional mobility training;Therapeutic activities;Therapeutic exercise;Balance training;Cognitive remediation;Patient/family education    PT Goals (Current goals can be found in the Care Plan section)  Acute Rehab PT Goals PT Goal Formulation: With patient Time For Goal Achievement: 06/26/23 Potential to Achieve Goals: Fair    Frequency Min 1X/week        AM-PAC PT "6 Clicks" Mobility  Outcome Measure Help needed turning from your back to your side while in a flat bed without using bedrails?: None Help needed moving from lying on your back to sitting on the side of a flat bed without using bedrails?: None Help needed moving to and from a bed to a chair (including a wheelchair)?: None Help needed standing up from a chair using your arms (e.g., wheelchair or bedside chair)?:  None Help needed to walk in hospital room?: None Help needed climbing 3-5 steps with a railing? : A Little 6 Click Score: 23    End of Session Equipment Utilized During Treatment: Gait belt Activity Tolerance: Patient limited by fatigue Patient left: in chair;with call bell/phone within reach;with chair alarm set Nurse Communication: Mobility status PT Visit Diagnosis: Unsteadiness on feet (R26.81);Other abnormalities of gait and mobility (R26.89);Muscle weakness (generalized) (M62.81);Pain Pain - part of body:  (abdomen)    Time: 1610-9604 PT Time Calculation (min) (ACUTE ONLY): 23 min   Charges:   PT Evaluation $PT Eval Moderate Complexity: 1 Mod PT Treatments $Therapeutic Activity: 8-22 mins PT General Charges $$ ACUTE PT VISIT: 1 Visit         Lars Jeziorski B. Beverely Risen PT, DPT Acute Rehabilitation Services Please use secure chat or  Call Office [phone]   [name] [facility] 06/11/2023, 11:35 AM

## 2023-06-11 NOTE — Progress Notes (Signed)
Nutrition Follow-up  DOCUMENTATION CODES:   Severe malnutrition in context of chronic illness, Underweight  INTERVENTION:  Recommend TPN initiation if unable to advance tube feeding in the setting of severe malnutrition  TF per J-tube - per Surgery Osmolite 1.2 at 46ml/h   Recommended TF regimen per J-tube, when able to advance further: Advance Osmolite 1.2 by 10ml q12h to a goal rate of 34ml/h ( per day)  Provides 1728 kcal, 80g protein, free water daily  Once appropriate, recommend Vitamin D repletion per tube 50,000 units every 7 days x7 weeks  When enteral nutrition is tolerated, will need to begin bariatric vitamin supplementation: MVI x 2 (can be crushed and given via tube) and Calcium carbonate 500 mg TID.   Request updated measured weight  NUTRITION DIAGNOSIS:   Severe Malnutrition related to chronic illness (PUD, gastroparesis, esophagitis, duodenal bulb stenosis) as evidenced by severe fat depletion, severe muscle depletion. - remains applicable  GOAL:   Patient will meet greater than or equal to 90% of their needs - goal unmet  MONITOR:   TF tolerance, Labs, Weight trends  REASON FOR ASSESSMENT:   Consult Enteral/tube feeding initiation and management (Resume TF at 39ml/hr)  ASSESSMENT:   67 yo female admitted with GOO d/t chronic PUD. S/P distal gastrectomy with roux en y gastrojejunostomy and placement of gastric and jejunal feeding tubes 9/20. PMH includes PUD, gastroparesis, duodenal diverticulum, duodenal bulb stenosis, esophagitis, GERD, hiatal hernia, gallstones, meningioma, HTN, seizures, B-12 deficiency.  9/20: s/p exlap, distal gastrectomy with roux-en-y gastrojejunostomy; GJ tube placement 9/22: trickle TF initiated 9/25: s/p abd xray-no bowel obstruction; TF advanced to 50ml/h  Pt sitting up in bedside recliner. She reports having nausea with mild emesis yesterday. Today she experienced 3 large episodes of emesis.   TF not likely  cause of emesis d/t feeds being via J-tube rather than gastric. Per Surgery, ok to resume tube feeding but holding at 16ml/h with no advancement per now as pt having BM's. Continue strict NPO. Pt concerned with resuming TF as she does not want to experience more emesis. Explained that TF at low rate via jejunum not likely contributing to this. Pt understanding and thankful for explanation.   If pt unable to advance TF to goal, pt would benefit from the addition of TPN given increased nutrition needs and presence of malnutrition.   No updated weight on file to review. Will place nursing order to update.   Medications: reglan, protonix Drips:  D5 with KCl @ 48ml/hr  Labs: Phos 2.4  Micronutrient labs: CRP 3.5 Vitamin D - 26.41 (L) Vitamin B12 - 745 Vitamin C - pending Zinc - pending  Venting G-tube: x12 hours  Diet Order:   Diet Order             Diet NPO time specified Except for: Ice Chips, Other (See Comments)  Diet effective now                   EDUCATION NEEDS:   Education needs have been addressed  Skin:  Skin Assessment: Skin Integrity Issues: Skin Integrity Issues:: Incisions Incisions: surgical incision to abdomen  Last BM:  9/24 (type 7 x2)  Height:   Ht Readings from Last 1 Encounters:  06/06/23 5\' 4"  (1.626 m)    Weight:   Wt Readings from Last 1 Encounters:  06/06/23 47.6 kg    Ideal Body Weight:  54.5 kg  BMI:  Body mass index is 18.02 kg/m.  Estimated Nutritional Needs:  Kcal:  1650-1850  Protein:  70-90 gm  Fluid:  1.7-1.9 L  Drusilla Kanner, RDN, LDN Clinical Nutrition

## 2023-06-12 LAB — BASIC METABOLIC PANEL
Anion gap: 8 (ref 5–15)
BUN: 12 mg/dL (ref 8–23)
CO2: 18 mmol/L — ABNORMAL LOW (ref 22–32)
Calcium: 7.8 mg/dL — ABNORMAL LOW (ref 8.9–10.3)
Chloride: 106 mmol/L (ref 98–111)
Creatinine, Ser: 0.51 mg/dL (ref 0.44–1.00)
GFR, Estimated: >60 mL/min (ref >60)
Glucose, Bld: 141 mg/dL — ABNORMAL HIGH (ref 70–99)
Potassium: 3.3 mmol/L — ABNORMAL LOW (ref 3.5–5.1)
Sodium: 132 mmol/L — ABNORMAL LOW (ref 135–145)

## 2023-06-12 LAB — CBC
HCT: 32.1 % — ABNORMAL LOW (ref 36.0–46.0)
Hemoglobin: 10.8 g/dL — ABNORMAL LOW (ref 12.0–15.0)
MCH: 30.5 pg (ref 26.0–34.0)
MCHC: 33.6 g/dL (ref 30.0–36.0)
MCV: 90.7 fL (ref 80.0–100.0)
Platelets: 432 10*3/uL — ABNORMAL HIGH (ref 150–400)
RBC: 3.54 MIL/uL — ABNORMAL LOW (ref 3.87–5.11)
RDW: 13.5 % (ref 11.5–15.5)
WBC: 10.7 10*3/uL — ABNORMAL HIGH (ref 4.0–10.5)
nRBC: 0 % (ref 0.0–0.2)

## 2023-06-12 LAB — PHOSPHORUS: Phosphorus: 2.5 mg/dL (ref 2.5–4.6)

## 2023-06-12 LAB — MAGNESIUM
Magnesium: 1.7 mg/dL (ref 1.7–2.4)
Magnesium: 1.7 mg/dL (ref 1.7–2.4)

## 2023-06-12 MED ORDER — MAGNESIUM SULFATE 2 GM/50ML IV SOLN
2.0000 g | Freq: Once | INTRAVENOUS | Status: AC
  Administered 2023-06-12: 2 g via INTRAVENOUS
  Filled 2023-06-12: qty 50

## 2023-06-12 MED ORDER — CHOLECALCIFEROL 10 MCG/ML (400 UNIT/ML) PO LIQD
1000.0000 [IU] | Freq: Every day | ORAL | Status: DC
  Administered 2023-06-12: 1000 [IU] via ORAL
  Filled 2023-06-12: qty 2.5

## 2023-06-12 MED ORDER — ACETAMINOPHEN 10 MG/ML IV SOLN
1000.0000 mg | Freq: Four times a day (QID) | INTRAVENOUS | Status: AC
  Administered 2023-06-12 – 2023-06-13 (×3): 1000 mg via INTRAVENOUS
  Filled 2023-06-12 (×3): qty 100

## 2023-06-12 MED ORDER — POTASSIUM CHLORIDE 10 MEQ/100ML IV SOLN
10.0000 meq | INTRAVENOUS | Status: AC
  Administered 2023-06-12 (×2): 10 meq via INTRAVENOUS
  Filled 2023-06-12 (×2): qty 100

## 2023-06-12 MED ORDER — OSMOLITE 1.2 CAL PO LIQD
1000.0000 mL | ORAL | Status: DC
  Administered 2023-06-12 – 2023-06-13 (×2): 1000 mL
  Filled 2023-06-12 (×5): qty 1000

## 2023-06-12 MED ORDER — CHOLECALCIFEROL 10 MCG/ML (400 UNIT/ML) PO LIQD
1000.0000 [IU] | Freq: Every day | ORAL | Status: DC
  Administered 2023-06-13 – 2023-06-16 (×4): 1000 [IU]
  Filled 2023-06-12 (×5): qty 2.5

## 2023-06-12 NOTE — Progress Notes (Addendum)
Patient ID: Lindsay Frost, female   DOB: 11/14/1955, 67 y.o.   MRN: [id number] [facility] Surgery Progress Note  6 Days Post-Op  Subjective: CC-  Seen with RN.   Feeling better today. Still having nausea and some burping/belching but only required zofran x 1 yesterday and no longer needing phenergan or compazine. No further vomiting. Denies any abdominal pain. Required 1 dose of IV Dilaudid yesterday (down from 5 doses the day prior). Passing flatus. Small formed BM yesterday that was non-bloody. Voiding. Oob with therapies yesterday.   Objective: Vital signs in last 24 hours: Temp:  [98 F (36.7 C)-98.2 F (36.8 C)] 98.1 F (36.7 C) (09/26 0451) Pulse Rate:  [79-85] 79 (09/26 0857) Resp:  [15-22] 15 (09/26 0857) BP: (118-148)/(70-78) 141/78 (09/26 0857) SpO2:  [96 %-98 %] 96 % (09/26 0857) Weight:  [46.8 kg] 46.8 kg (09/26 0451) Last BM Date : 06/10/23  Intake/Output from previous day: 09/25 0701 - 09/26 0700 In: 1781.7 [I.V.:510.8; NG/GT:377; IV Piggyback:473.9] Out: 655 [Drains:655] Intake/Output this shift: Total I/O In: 256.2 [I.V.:256.2] Out: 200 [Urine:200]  PE: Gen:  Alert, NAD Card: Reg Pulm:  CTA b/l Abd: Soft, improved mild distension, NT. Midline cdi with staples in place. G tube to gravity with bilious output in gravity bag, 655cc/24 hours. J tube with TF running at 30cc/hr  Lab Results:  Recent Labs    06/11/23 0550 06/12/23 0711  WBC 9.8 10.7*  HGB 11.7* 10.8*  HCT 35.0* 32.1*  PLT 385 432*   BMET Recent Labs    06/11/23 0550 06/12/23 0711  NA 135 132*  K 3.5 3.3*  CL 107 106  CO2 19* 18*  GLUCOSE 130* 141*  BUN 10 12  CREATININE 0.66 0.51  CALCIUM 8.3* 7.8*   PT/INR No results for input(s): "LABPROT", "INR" in the last 72 hours. CMP     Component Value Date/Time   NA 132 (L) 06/12/2023 0711   NA 144 04/13/2021 1544   K 3.3 (L) 06/12/2023 0711   CL 106 06/12/2023 0711   CO2 18 (L) 06/12/2023 0711   GLUCOSE 141 (H)  06/12/2023 0711   BUN 12 06/12/2023 0711   BUN 15 04/13/2021 1544   CREATININE 0.51 06/12/2023 0711   CREATININE 1.08 (H) 02/16/2021 1506   CALCIUM 7.8 (L) 06/12/2023 0711   PROT 6.6 05/30/2023 1200   ALBUMIN 3.4 (L) 05/30/2023 1200   AST 18 05/30/2023 1200   ALT 11 05/30/2023 1200   ALKPHOS 47 05/30/2023 1200   BILITOT <0.1 (L) 05/30/2023 1200   GFRNONAA >60 06/12/2023 0711   GFRNONAA >89 01/17/2016 1424   GFRAA 67 07/25/2020 1427   GFRAA >89 01/17/2016 1424   Lipase     Component Value Date/Time   LIPASE 24 02/16/2021 1506       Studies/Results: DG Abd Portable 1V  Result Date: 06/11/2023 CLINICAL DATA:  Vomiting EXAM: PORTABLE ABDOMEN - 1 VIEW COMPARISON:  KUB 06/06/2023 FINDINGS: A gastrostomy tube is again noted projecting over the left hemiabdomen. A surgical drain projects over the right pelvis. Cholecystectomy clips and abdominal sutures are again noted. There is no evidence of bowel obstruction. There is no definite free intraperitoneal air. There is no acute osseous abnormality. IMPRESSION: Gastrostomy tube and right pelvic drain remain in place. Nonobstructive bowel gas pattern. Electronically Signed   By: Lesia Hausen M.D.   On: 06/11/2023 13:55    Anti-infectives: Anti-infectives (From admission, onward)    Start     Dose/Rate Route Frequency Ordered  Stop   06/06/23 0600  cefoTEtan (CEFOTAN) 2 g in sodium chloride 0.9 % 100 mL IVPB        2 g 200 mL/hr over 30 Minutes Intravenous On call to O.R. 06/06/23 0555 06/06/23 0818        Assessment/Plan Gastric outlet obstruction due to chronic peptic ulcer disease  -POD#6 s/p Exploratory laparotomy, Distal gastrectomy with Roux-en-Y gastrojejunostomy, Placement of 24-French gastric tube, Placement of 18-French jejunostomy tube 9/20 Dr. Andrey Campanile - Surgical path negative for H pylori and negative for malignancy - Continue IV tylenol and IV robaxin. Offered PCA 9/24. Cont PRN Dilaudid. Avoid NSAIDs - Cont G-tube to  gravity drainage, strict I/O, flushes Q4hrs. If any further vomiting will place G-tube to LIWS - Scheduled Reglan. Continue PRN anti-emetics. QT/QTc 384/437 ms 9/24 - Patient with return of bowel function. Can slowly increase TF's to goal. RD consult - Cont to monitor electrolytes. Replace K and Mg - Mobilize, PT - no fu recommended  - No whole or crushed meds via J-tube to avoid clogging  ID - Cefotetan periop. None currently. Afebrile. No tachycardia or hypotension. WBC 10.7 today > monitor/repeat in AM.  FEN - IVF, NPO except ice chips, slowly increase TF's to goal  VTE - SCDs, Lovenox Foley - out and voiding  HTN - PRN meds while NPO Hx Seizures - Home meds   LOS: 6 days    Jacinto Halim, [facility] Surgery 06/12/2023, 9:14 AM Please see Amion for pager number during day hours 7:00am-4:30pm

## 2023-06-12 NOTE — Progress Notes (Signed)
   06/12/23 1103  Mobility  Activity Ambulated with assistance in hallway;Transferred from bed to chair  Level of Assistance Contact guard assist, steadying assist  Assistive Device None;Other (Comment) (Handrails)  Distance Ambulated (ft) 150 ft  Activity Response Tolerated fair  Mobility Referral Yes  $Mobility charge 1 Mobility  Mobility Specialist Start Time (ACUTE ONLY) 1025  Mobility Specialist Stop Time (ACUTE ONLY) 1100  Mobility Specialist Time Calculation (min) (ACUTE ONLY) 35 min   Mobility Specialist: Progress Note  Post-Mobility: HR 93, BP 140/88 (108), SpO2 96% RA        Pt agreeable to mobility session - received in bed. Required CG throughout with occasional use of handrails, LOB x3. Pt with antalgic gait, drifting L to R. C/o slight dizziness and SOB . Pt returned to chair with all needs met - call bell within reach. Nursing student and IV team in room.  Barnie Mort, BS Mobility Specialist Please contact via SecureChat or Rehab office at [phone].

## 2023-06-12 NOTE — Evaluation (Signed)
Occupational Therapy Evaluation Patient Details Name: Lindsay Frost MRN: [id number] DOB: September 06, 1956 Today's Date: 06/12/2023   History of Present Illness 67 y.o. 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 PMH: back pain, cholecystectomy, gastric ulcer, depression.   Clinical Impression   PTA, pt lived with sister and was independent in home and the community. Upon eval, pt presents with pain, nausea, decreased activity tolerance. Pt performing ADL with up to supervision for safety. Observed with decreased activity tolerance and need for activity pacing throughout. Reviewed compensatory strategies for LB ADL during session and pt demo understanding. Will follow acutely to optimize education, activity tolerance, and safety, but do not suspect need for follow up therapy after discharge.       If plan is discharge home, recommend the following: A little help with bathing/dressing/bathroom;Assistance with cooking/housework;Assist for transportation    Functional Status Assessment  Patient has had a recent decline in their functional status and demonstrates the ability to make significant improvements in function in a reasonable and predictable amount of time.  Equipment Recommendations  BSC/3in1    Recommendations for Other Services       Precautions / Restrictions Precautions Precautions: None Restrictions Weight Bearing Restrictions: No      Mobility Bed Mobility               General bed mobility comments: up in chair    Transfers Overall transfer level: Modified independent Equipment used: None               General transfer comment: good power up and self steadying; slow to rise      Balance Overall balance assessment: Mild deficits observed, not formally tested                                          ADL either performed or assessed with clinical judgement   ADL Overall ADL's : Needs assistance/impaired Eating/Feeding: NPO   Grooming: Wash/dry hands;Modified independent   Upper Body Bathing: Modified independent   Lower Body Bathing: Modified independent   Upper Body Dressing : Modified independent   Lower Body Dressing: Modified independent Lower Body Dressing Details (indicate cue type and reason): reviewed compensatory techniques Toilet Transfer: Supervision/safety;Ambulation Toilet Transfer Details (indicate cue type and reason): supervision, slow to rise and slow with mobility overall. Poor awareness of location of IV pole needing assist to manage Toileting- Clothing Manipulation and Hygiene: Sitting/lateral lean;Sit to/from stand;Modified independent       Functional mobility during ADLs: Supervision/safety       Vision Ability to See in Adequate Light: 0 Adequate Patient Visual Report: No change from baseline Vision Assessment?: No apparent visual deficits     Perception Perception: Within Functional Limits       Praxis Praxis: WFL       Pertinent Vitals/Pain Pain Assessment Pain Assessment: Faces Faces Pain Scale: Hurts little more Pain Location: abdomen with movement Pain Descriptors / Indicators: Grimacing Pain Intervention(s): Limited activity within patient's tolerance     Extremity/Trunk Assessment Upper Extremity Assessment Upper Extremity Assessment: Overall WFL for tasks assessed   Lower Extremity Assessment Lower Extremity Assessment: Defer to PT evaluation   Cervical / Trunk Assessment Cervical / Trunk Assessment: Kyphotic (cachetic)   Communication Communication Communication: No apparent  difficulties Cueing Techniques: Verbal cues   Cognition Arousal: Alert Behavior During Therapy: WFL for tasks assessed/performed, Flat affect Overall Cognitive Status: Impaired/Different from  baseline Area of Impairment: Awareness, Following commands, Safety/judgement, Problem solving                       Following Commands: Follows one step commands with increased time, Follows multi-step commands with increased time Safety/Judgement: Decreased awareness of safety Awareness: Anticipatory Problem Solving: Requires verbal cues, Requires tactile cues General Comments: Pt with fair awareness of current abilities, but with greater abilities than she perceives. Pt moving about room during session with litt;e awareness of location of IV pole and making fairly quick movements intermittently. pt following all commands with increased time. Flat in affect     General Comments       Exercises     Shoulder Instructions      Home Living Family/patient expects to be discharged to:: Private residence Living Arrangements: Other relatives (sister) Available Help at Discharge: Family;Available PRN/intermittently Type of Home: House Home Access: Level entry     Home Layout: One level     Bathroom Shower/Tub: Chief Strategy Officer: Standard Bathroom Accessibility: Yes   Home Equipment: None   Additional Comments: Pt reports that sister is present but is older than her and unable to help physically with ADL other than bringing a glass of water or another similar task      Prior Functioning/Environment Prior Level of Function : Independent/Modified Independent             Mobility Comments: able to ambulate community distances without AD ADLs Comments: independent        OT Problem List: Decreased strength;Impaired balance (sitting and/or standing);Decreased activity tolerance;Decreased cognition;Decreased safety awareness;Decreased knowledge of use of DME or AE      OT Treatment/Interventions: Self-care/ADL training;Therapeutic exercise;DME and/or AE instruction;Balance training;Patient/family education;Therapeutic activities    OT Goals(Current  goals can be found in the care plan section) Acute Rehab OT Goals Patient Stated Goal: get better OT Goal Formulation: With patient Time For Goal Achievement: 06/26/23 Potential to Achieve Goals: Good  OT Frequency: Min 1X/week    Co-evaluation              AM-PAC OT "6 Clicks" Daily Activity     Outcome Measure Help from another person eating meals?: None Help from another person taking care of personal grooming?: None Help from another person toileting, which includes using toliet, bedpan, or urinal?: A Little Help from another person bathing (including washing, rinsing, drying)?: A Little Help from another person to put on and taking off regular upper body clothing?: None Help from another person to put on and taking off regular lower body clothing?: None 6 Click Score: 22   End of Session Nurse Communication: Mobility status  Activity Tolerance: Patient tolerated treatment well Patient left: in chair;with call bell/phone within reach;with nursing/sitter in room  OT Visit Diagnosis: Unsteadiness on feet (R26.81);Muscle weakness (generalized) (M62.81);Other symptoms and signs involving cognitive function                Time: 0981-1914 OT Time Calculation (min): 25 min Charges:  OT General Charges $OT Visit: 1 Visit OT Evaluation $OT Eval Low Complexity: 1 Low OT Treatments $Self Care/Home Management : 8-22 mins  Lindsay Frost, OTR/L [facility] Acute Rehabilitation Office: [phone]   Lindsay Frost 06/12/2023, 2:43 PM

## 2023-06-12 NOTE — Progress Notes (Signed)
Nutrition Brief Note  Secure chat received from Surgery today. Plans to slowly advance TF via J-tube. Advancement orders placed and RN updated.   Surgery also agrees to liquid Vitamin D repletion via J-tube. Reached out to Pharmacy for appropriate dosing.   Will continue to monitor TF tolerance. Please re-consult or reach out to RD via pager/secure chat if additional nutrition related concerns should arise in the meantime.   Drusilla Kanner, RDN, LDN Clinical Nutrition

## 2023-06-13 LAB — CBC
HCT: 33.2 % — ABNORMAL LOW (ref 36.0–46.0)
Hemoglobin: 11 g/dL — ABNORMAL LOW (ref 12.0–15.0)
MCH: 30 pg (ref 26.0–34.0)
MCHC: 33.1 g/dL (ref 30.0–36.0)
MCV: 90.5 fL (ref 80.0–100.0)
Platelets: 520 10*3/uL — ABNORMAL HIGH (ref 150–400)
RBC: 3.67 MIL/uL — ABNORMAL LOW (ref 3.87–5.11)
RDW: 13.7 % (ref 11.5–15.5)
WBC: 12.5 10*3/uL — ABNORMAL HIGH (ref 4.0–10.5)
nRBC: 0 % (ref 0.0–0.2)

## 2023-06-13 LAB — BASIC METABOLIC PANEL
Anion gap: 5 (ref 5–15)
BUN: 11 mg/dL (ref 8–23)
CO2: 22 mmol/L (ref 22–32)
Calcium: 7.7 mg/dL — ABNORMAL LOW (ref 8.9–10.3)
Chloride: 105 mmol/L (ref 98–111)
Creatinine, Ser: 0.42 mg/dL — ABNORMAL LOW (ref 0.44–1.00)
GFR, Estimated: >60 mL/min (ref >60)
Glucose, Bld: 117 mg/dL — ABNORMAL HIGH (ref 70–99)
Potassium: 3.8 mmol/L (ref 3.5–5.1)
Sodium: 132 mmol/L — ABNORMAL LOW (ref 135–145)

## 2023-06-13 LAB — VITAMIN C: Vitamin C: 0.3 mg/dL — ABNORMAL LOW (ref 0.4–2.0)

## 2023-06-13 MED ORDER — ACETAMINOPHEN 10 MG/ML IV SOLN
1000.0000 mg | Freq: Four times a day (QID) | INTRAVENOUS | Status: AC
  Administered 2023-06-13 – 2023-06-14 (×4): 1000 mg via INTRAVENOUS
  Filled 2023-06-13 (×4): qty 100

## 2023-06-13 NOTE — Progress Notes (Signed)
Physical Therapy Treatment Patient Details Name: Lindsay Frost MRN: [id number] DOB: May 21, 1956 Today's Date: 06/13/2023   History of Present Illness 67 y.o. 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 PMH: back pain, cholecystectomy, gastric ulcer, depression.    PT Comments  Pt supine in bed in very flexed position with HOB and LE maximally elevated. Pt agreeable to work with therapy wanting to walk in hallway like she had yesterday. Pt continues to have poor awareness but has better command follow, mobility is limited by generalized weakness and associated decreased endurance. Pt is mod I for bed mobility and transfers and contact guard progressing to light min A after slight LoB for ambulation in hallway. Despite encouragement not able to walk quite as far as she did yesterday. D/c plan remains appropriate. PT will continue to follow acutely.    If plan is discharge home, recommend the following: A little help with walking and/or transfers;A little help with bathing/dressing/bathroom;Assistance with cooking/housework;Assist for transportation   Can travel by private vehicle      Yes  Equipment Recommendations  BSC/3in1       Precautions / Restrictions Precautions Precautions: None Restrictions Weight Bearing Restrictions: No     Mobility  Bed Mobility Overal bed mobility: Modified Independent             General bed mobility comments: struggles to get out of bed with feet full elevated despite PT saying wait until the bed is flattened, use of bed rail to pull to EoB    Transfers Overall transfer level: Modified independent Equipment used: None               General transfer comment: with very flexed positioning and increased effort able to come to upright without outside assistance     Ambulation/Gait Ambulation/Gait assistance: Contact guard assist Gait Distance (Feet): 120 Feet Assistive device: None, 1 person hand held assist Gait Pattern/deviations: Step-through pattern, Decreased step length - right, Decreased step length - left, Narrow base of support Gait velocity: slowed Gait velocity interpretation: <1.31 ft/sec, indicative of household ambulator   General Gait Details: initially able to ambulate without assist, but has minor LoB and reaches out for PT to steady, continued ambulation with HHA       Balance Overall balance assessment: Mild deficits observed, not formally tested                                          Cognition Arousal: Alert Behavior During Therapy: WFL for tasks assessed/performed, Flat affect Overall Cognitive Status: Impaired/Different from baseline Area of Impairment: Awareness, Following commands, Safety/judgement, Problem solving                       Following Commands: Follows one step commands with increased time, Follows multi-step commands with increased time   Awareness: Anticipatory Problem Solving: Requires verbal cues, Requires tactile cues General Comments: improved awareness today, continues to be very flat, but is able to follow commands           General Comments General comments (skin integrity, edema, etc.): better awareness today, agreeable with sitting up      Pertinent Vitals/Pain Pain Assessment Pain Assessment: 0-10 Pain Score: 7  Pain Location: abdomen and back Pain  Descriptors / Indicators: Grimacing Pain Intervention(s): Limited activity within patient's tolerance, Monitored during session, Repositioned     PT Goals (current goals can now be found in the care plan section) Acute Rehab PT Goals PT Goal Formulation: With patient Time For Goal Achievement: 06/26/23 Potential to Achieve Goals: Fair Progress towards PT goals: Progressing toward goals     Frequency    Min 1X/week       AM-PAC PT "6 Clicks" Mobility   Outcome Measure  Help needed turning from your back to your side while in a flat bed without using bedrails?: None Help needed moving from lying on your back to sitting on the side of a flat bed without using bedrails?: None Help needed moving to and from a bed to a chair (including a wheelchair)?: None Help needed standing up from a chair using your arms (e.g., wheelchair or bedside chair)?: None Help needed to walk in hospital room?: None Help needed climbing 3-5 steps with a railing? : A Little 6 Click Score: 23    End of Session Equipment Utilized During Treatment: Gait belt Activity Tolerance: Patient limited by fatigue Patient left: in chair;with call bell/phone within reach;with chair alarm set Nurse Communication: Mobility status PT Visit Diagnosis: Unsteadiness on feet (R26.81);Other abnormalities of gait and mobility (R26.89);Muscle weakness (generalized) (M62.81);Pain Pain - part of body:  (abdomen)     Time: 8657-8469 PT Time Calculation (min) (ACUTE ONLY): 17 min  Charges:    $Therapeutic Exercise: 8-22 mins PT General Charges $$ ACUTE PT VISIT: 1 Visit                     Lindsay Frost B. Beverely Risen PT, DPT Acute Rehabilitation Services Please use secure chat or  Call Office [phone]    [name] [facility] 06/13/2023, 11:40 AM

## 2023-06-13 NOTE — Progress Notes (Signed)
Patient ID: Lindsay Frost, female   DOB: Oct 30, 1955, 67 y.o.   MRN: [id number] [facility] Surgery Progress Note  7 Days Post-Op  Subjective: CC-  Having more back and abdominal pain this morning. Thinks it may be due to the weather changing. More nausea as well, no emesis. Passing flatus, BM yesterday. TF running at 50cc/hr with goal of 60cc/hr.  Objective: Vital signs in last 24 hours: Temp:  [98.2 F (36.8 C)-98.8 F (37.1 C)] 98.6 F (37 C) (09/27 0954) Pulse Rate:  [81-95] 92 (09/27 0954) Resp:  [16-24] 16 (09/27 0954) BP: (115-142)/(74-91) 142/91 (09/27 0954) SpO2:  [96 %-98 %] 96 % (09/27 0954) Weight:  [45.3 kg] 45.3 kg (09/27 0217) Last BM Date : 06/11/23  Intake/Output from previous day: 09/26 0701 - 09/27 0700 In: 2776.6 [I.V.:1494.6; NG/GT:957.2; IV Piggyback:324.8] Out: 500 [Urine:250; Drains:250] Intake/Output this shift: No intake/output data recorded.  PE: Gen:  Alert, NAD Abd: Soft, minimal distension, bowel sounds present. Mild tenderness over incision. Midline cdi with staples in place. G tube to gravity with dark bilious output in gravity bag, 250cc/24 hours. J tube with TF running at 50cc/hr  Lab Results:  Recent Labs    06/11/23 0550 06/12/23 0711  WBC 9.8 10.7*  HGB 11.7* 10.8*  HCT 35.0* 32.1*  PLT 385 432*   BMET Recent Labs    06/11/23 0550 06/12/23 0711  NA 135 132*  K 3.5 3.3*  CL 107 106  CO2 19* 18*  GLUCOSE 130* 141*  BUN 10 12  CREATININE 0.66 0.51  CALCIUM 8.3* 7.8*   PT/INR No results for input(s): "LABPROT", "INR" in the last 72 hours. CMP     Component Value Date/Time   NA 132 (L) 06/12/2023 0711   NA 144 04/13/2021 1544   K 3.3 (L) 06/12/2023 0711   CL 106 06/12/2023 0711   CO2 18 (L) 06/12/2023 0711   GLUCOSE 141 (H) 06/12/2023 0711   BUN 12 06/12/2023 0711   BUN 15 04/13/2021 1544   CREATININE 0.51 06/12/2023 0711   CREATININE 1.08 (H) 02/16/2021 1506   CALCIUM 7.8 (L) 06/12/2023 0711   PROT 6.6  05/30/2023 1200   ALBUMIN 3.4 (L) 05/30/2023 1200   AST 18 05/30/2023 1200   ALT 11 05/30/2023 1200   ALKPHOS 47 05/30/2023 1200   BILITOT <0.1 (L) 05/30/2023 1200   GFRNONAA >60 06/12/2023 0711   GFRNONAA >89 01/17/2016 1424   GFRAA 67 07/25/2020 1427   GFRAA >89 01/17/2016 1424   Lipase     Component Value Date/Time   LIPASE 24 02/16/2021 1506       Studies/Results: No results found.  Anti-infectives: Anti-infectives (From admission, onward)    Start     Dose/Rate Route Frequency Ordered Stop   06/06/23 0600  cefoTEtan (CEFOTAN) 2 g in sodium chloride 0.9 % 100 mL IVPB        2 g 200 mL/hr over 30 Minutes Intravenous On call to O.R. 06/06/23 0555 06/06/23 0818        Assessment/Plan Gastric outlet obstruction due to chronic peptic ulcer disease  -POD#7 s/p Exploratory laparotomy, Distal gastrectomy with Roux-en-Y gastrojejunostomy, Placement of 24-French gastric tube, Placement of 18-French jejunostomy tube 9/20 Dr. Andrey Campanile - Surgical path negative for H pylori and negative for malignancy - Continue IV tylenol and IV robaxin. Offered PCA 9/24. Cont PRN Dilaudid. Avoid NSAIDs - Cont G-tube to gravity drainage, strict I/O, flushes Q4hrs. G-tube output slowly decreasing. - Scheduled Reglan. Continue PRN anti-emetics. QT/QTc 384/437  ms 9/24 - Patient with return of bowel function. Continue to advance TF's to goal (at 50cc/hr with goal of 50cc/hr) - Labs pending - Mobilize, PT - no fu recommended  - No whole or crushed meds via J-tube to avoid clogging   ID - Cefotetan periop. None currently. Afebrile. CBC Pending FEN - IVF, G-tube to gravity, NPO except ice chips, slowly increase TF's to goal  VTE - SCDs, Lovenox Foley - out and voiding   HTN - PRN meds while NPO Hx Seizures - Home meds    LOS: 7 days    Franne Forts, [facility] Surgery 06/13/2023, 10:40 AM Please see Amion for pager number during day hours 7:00am-4:30pm

## 2023-06-14 MED ORDER — CALCIUM GLUCONATE-NACL 2-0.675 GM/100ML-% IV SOLN
2.0000 g | Freq: Once | INTRAVENOUS | Status: AC
  Administered 2023-06-14: 2000 mg via INTRAVENOUS
  Filled 2023-06-14: qty 100

## 2023-06-14 MED ORDER — KCL-LACTATED RINGERS-D5W 20 MEQ/L IV SOLN
INTRAVENOUS | Status: DC
  Filled 2023-06-14 (×12): qty 1000

## 2023-06-14 NOTE — Progress Notes (Signed)
Patient ID: Lindsay Frost, female   DOB: September 21, 1955, 67 y.o.   MRN: [id number] [facility] Surgery Progress Note  8 Days Post-Op  Subjective: TF running at 50 cc/h. Patient reports vomiting 4x overnight, large volume. G-tube is draining to gravity. She is having some bowel function as well.   Objective: Vital signs in last 24 hours: Temp:  [98.2 F (36.8 C)-98.6 F (37 C)] 98.6 F (37 C) (09/28 0726) Pulse Rate:  [92-106] 92 (09/28 0726) Resp:  [16-24] 16 (09/28 0726) BP: (129-154)/(66-91) 146/89 (09/28 0726) SpO2:  [96 %-100 %] 100 % (09/28 0726) Weight:  [45.3 kg] 45.3 kg (09/28 0352) Last BM Date : 06/11/23  Intake/Output from previous day: 09/27 0701 - 09/28 0700 In: 2457.2 [P.O.:240; I.V.:1067.2; IV Piggyback:500] Out: 500 [Drains:500] Intake/Output this shift: No intake/output data recorded.  PE: Gen:  Alert, NAD Abd: Soft, minimal distension, bowel sounds present. Mild tenderness over incision. Midline cdi with staples in place. G tube to gravity with dark bilious output in gravity bag, 500cc/24 hours. J tube with TF running at 50cc/hr  Lab Results:  Recent Labs    06/12/23 0711 06/13/23 1244  WBC 10.7* 12.5*  HGB 10.8* 11.0*  HCT 32.1* 33.2*  PLT 432* 520*   BMET Recent Labs    06/12/23 0711 06/13/23 1244  NA 132* 132*  K 3.3* 3.8  CL 106 105  CO2 18* 22  GLUCOSE 141* 117*  BUN 12 11  CREATININE 0.51 0.42*  CALCIUM 7.8* 7.7*   PT/INR No results for input(s): "LABPROT", "INR" in the last 72 hours. CMP     Component Value Date/Time   NA 132 (L) 06/13/2023 1244   NA 144 04/13/2021 1544   K 3.8 06/13/2023 1244   CL 105 06/13/2023 1244   CO2 22 06/13/2023 1244   GLUCOSE 117 (H) 06/13/2023 1244   BUN 11 06/13/2023 1244   BUN 15 04/13/2021 1544   CREATININE 0.42 (L) 06/13/2023 1244   CREATININE 1.08 (H) 02/16/2021 1506   CALCIUM 7.7 (L) 06/13/2023 1244   PROT 6.6 05/30/2023 1200   ALBUMIN 3.4 (L) 05/30/2023 1200   AST 18 05/30/2023 1200    ALT 11 05/30/2023 1200   ALKPHOS 47 05/30/2023 1200   BILITOT <0.1 (L) 05/30/2023 1200   GFRNONAA >60 06/13/2023 1244   GFRNONAA >89 01/17/2016 1424   GFRAA 67 07/25/2020 1427   GFRAA >89 01/17/2016 1424   Lipase     Component Value Date/Time   LIPASE 24 02/16/2021 1506       Studies/Results: No results found.  Anti-infectives: Anti-infectives (From admission, onward)    Start     Dose/Rate Route Frequency Ordered Stop   06/06/23 0600  cefoTEtan (CEFOTAN) 2 g in sodium chloride 0.9 % 100 mL IVPB        2 g 200 mL/hr over 30 Minutes Intravenous On call to O.R. 06/06/23 0555 06/06/23 0818        Assessment/Plan Gastric outlet obstruction due to chronic peptic ulcer disease  -POD#8 s/p Exploratory laparotomy, Distal gastrectomy with Roux-en-Y gastrojejunostomy, Placement of 24-French gastric tube, Placement of 18-French jejunostomy tube 9/20 Dr. Andrey Campanile - Surgical path negative for H pylori and negative for malignancy - Continue IV tylenol and IV robaxin. Offered PCA 9/24. Cont PRN Dilaudid. Avoid NSAIDs - Cont G-tube to gravity drainage, strict I/O, flushes Q4hrs. G-tube output up some and patient having emesis despite g-tube to gravity - Scheduled Reglan. Continue PRN anti-emetics. QT/QTc 384/437 ms 9/24 - Patient having  bowel function but emesis as well - hold TF today  - Labs ok although calcium low  - Mobilize, PT - no fu recommended  - No whole or crushed meds via J-tube to avoid clogging   ID - Cefotetan periop. None currently. Afebrile. WBC 12 from 10, recheck in AM FEN - IVF, G-tube to gravity, NPO except ice chips, hold TF VTE - SCDs, Lovenox Foley - out and voiding   HTN - PRN meds while NPO Hx Seizures - Home meds    LOS: 8 days    Juliet Rude, [facility] Surgery 06/14/2023, 8:40 AM Please see Amion for pager number during day hours 7:00am-4:30pm

## 2023-06-14 NOTE — Progress Notes (Signed)
   06/14/23 1132  Mobility  Activity Ambulated with assistance in hallway  Level of Assistance Contact guard assist, steadying assist  Assistive Device Other (Comment) (IV Pole and HHA)  Distance Ambulated (ft) 200 ft  Activity Response Tolerated fair  Mobility Referral Yes  $Mobility charge 1 Mobility  Mobility Specialist Start Time (ACUTE ONLY) 1100  Mobility Specialist Stop Time (ACUTE ONLY) 1125  Mobility Specialist Time Calculation (min) (ACUTE ONLY) 25 min   Mobility Specialist: Progress Note  Pt agreeable to mobility session - received in EOB. Required CG using IV pole and HHA with crossover gait x1 and knee buckling x1. C/o mid back pain rated 9/10 and nausea. Returned to chair with all needs met - call bell within reach.   Barnie Mort, BS Mobility Specialist Please contact via SecureChat or Rehab office at [phone].

## 2023-06-14 NOTE — Plan of Care (Signed)
°  Problem: Nutrition: Goal: Adequate nutrition will be maintained Outcome: Not Progressing   Problem: Coping: Goal: Level of anxiety will decrease Outcome: Progressing   Problem: Pain Managment: Goal: General experience of comfort will improve Outcome: Progressing   Problem: Safety: Goal: Ability to remain free from injury will improve Outcome: Progressing   

## 2023-06-14 NOTE — Progress Notes (Addendum)
During shift patients G tube began leaking and oozing copious amounts of green drainage and saturated through gown and multiple gauze dressings. Site around tube is red and oozing drainage. Tube is able to flush but there is minimal drainage from G tube gravity bag. of recorded drainage during shift.  On call provider Metzger notified  On call provider Sophronia Simas also notified and said to frequently change dressings and add barrier cream.  Site around drain changed with gauze dressings and secured with tape. Patient is resting comfortably and in no distress Will continue to monitor patient

## 2023-06-15 LAB — BASIC METABOLIC PANEL
Anion gap: 9 (ref 5–15)
BUN: 11 mg/dL (ref 8–23)
CO2: 23 mmol/L (ref 22–32)
Calcium: 7.9 mg/dL — ABNORMAL LOW (ref 8.9–10.3)
Chloride: 101 mmol/L (ref 98–111)
Creatinine, Ser: 0.42 mg/dL — ABNORMAL LOW (ref 0.44–1.00)
GFR, Estimated: >60 mL/min (ref >60)
Glucose, Bld: 103 mg/dL — ABNORMAL HIGH (ref 70–99)
Potassium: 4.1 mmol/L (ref 3.5–5.1)
Sodium: 133 mmol/L — ABNORMAL LOW (ref 135–145)

## 2023-06-15 LAB — CBC
HCT: 32.1 % — ABNORMAL LOW (ref 36.0–46.0)
Hemoglobin: 10.8 g/dL — ABNORMAL LOW (ref 12.0–15.0)
MCH: 30.9 pg (ref 26.0–34.0)
MCHC: 33.6 g/dL (ref 30.0–36.0)
MCV: 92 fL (ref 80.0–100.0)
Platelets: 625 10*3/uL — ABNORMAL HIGH (ref 150–400)
RBC: 3.49 MIL/uL — ABNORMAL LOW (ref 3.87–5.11)
RDW: 13.5 % (ref 11.5–15.5)
WBC: 16.2 10*3/uL — ABNORMAL HIGH (ref 4.0–10.5)
nRBC: 0 % (ref 0.0–0.2)

## 2023-06-15 MED ORDER — OSMOLITE 1.2 CAL PO LIQD
1000.0000 mL | ORAL | Status: DC
  Administered 2023-06-15 – 2023-06-16 (×2): 1000 mL
  Filled 2023-06-15: qty 1000

## 2023-06-15 NOTE — Progress Notes (Signed)
   06/15/23 1346  Mobility  Activity Transferred to/from [facility];Ambulated with assistance in hallway  Level of Assistance Contact guard assist, steadying assist  Assistive Device  (IV Pole)  Distance Ambulated (ft) 125 ft  Activity Response Tolerated fair  Mobility Referral Yes  $Mobility charge 1 Mobility  Mobility Specialist Start Time (ACUTE ONLY) 1325  Mobility Specialist Stop Time (ACUTE ONLY) 1340  Mobility Specialist Time Calculation (min) (ACUTE ONLY) 15 min   Mobility Specialist: Progress Note  Pt agreeable to mobility session - received in chair. Required CG using IV pole. C/o  pain at gastrostomy site rated 7/10 . Returned to bed with all needs met - call bell within reach.   Barnie Mort, BS Mobility Specialist Please contact via SecureChat or Rehab office at [phone].

## 2023-06-15 NOTE — Plan of Care (Signed)
  Problem: Pain Managment: Goal: General experience of comfort will improve Outcome: Progressing   Problem: Safety: Goal: Ability to remain free from injury will improve Outcome: Progressing   Problem: Skin Integrity: Goal: Risk for impaired skin integrity will decrease Outcome: Progressing   

## 2023-06-15 NOTE — Progress Notes (Signed)
Patient ID: Lindsay Frost, female   DOB: 07/19/1956, 67 y.o.   MRN: [id number] [facility] Surgery Progress Note  9 Days Post-Op  Subjective: TF held yesterday and no emesis but significant drainage around G-tube. Confirmed with patient that she was not throwing up TF the day prior, emesis was bilious.   Objective: Vital signs in last 24 hours: Temp:  [98.2 F (36.8 C)-98.5 F (36.9 C)] 98.5 F (36.9 C) (09/29 7829) Pulse Rate:  [87-105] 95 (09/29 0808) Resp:  [16-18] 18 (09/29 0808) BP: (117-128)/(71-75) 119/75 (09/29 0808) SpO2:  [93 %-98 %] 93 % (09/29 0808) Last BM Date : 06/14/23  Intake/Output from previous day: 09/28 0701 - 09/29 0700 In: 1661.9 [I.V.:830.6; IV Piggyback:691.3] Out: 200 [Drains:200] Intake/Output this shift: No intake/output data recorded.  PE: Gen:  Alert, NAD Abd: Soft, minimal distension, bowel sounds present. Mild tenderness over incision. Midline cdi with staples in place. G tube to gravity with some bilious drainage in bag but leaking large amount of drainage around. J tube clamped this AM  Lab Results:  Recent Labs    06/13/23 1244  WBC 12.5*  HGB 11.0*  HCT 33.2*  PLT 520*   BMET Recent Labs    06/13/23 1244  NA 132*  K 3.8  CL 105  CO2 22  GLUCOSE 117*  BUN 11  CREATININE 0.42*  CALCIUM 7.7*   PT/INR No results for input(s): "LABPROT", "INR" in the last 72 hours. CMP     Component Value Date/Time   NA 132 (L) 06/13/2023 1244   NA 144 04/13/2021 1544   K 3.8 06/13/2023 1244   CL 105 06/13/2023 1244   CO2 22 06/13/2023 1244   GLUCOSE 117 (H) 06/13/2023 1244   BUN 11 06/13/2023 1244   BUN 15 04/13/2021 1544   CREATININE 0.42 (L) 06/13/2023 1244   CREATININE 1.08 (H) 02/16/2021 1506   CALCIUM 7.7 (L) 06/13/2023 1244   PROT 6.6 05/30/2023 1200   ALBUMIN 3.4 (L) 05/30/2023 1200   AST 18 05/30/2023 1200   ALT 11 05/30/2023 1200   ALKPHOS 47 05/30/2023 1200   BILITOT <0.1 (L) 05/30/2023 1200   GFRNONAA >60  06/13/2023 1244   GFRNONAA >89 01/17/2016 1424   GFRAA 67 07/25/2020 1427   GFRAA >89 01/17/2016 1424   Lipase     Component Value Date/Time   LIPASE 24 02/16/2021 1506       Studies/Results: No results found.  Anti-infectives: Anti-infectives (From admission, onward)    Start     Dose/Rate Route Frequency Ordered Stop   06/06/23 0600  cefoTEtan (CEFOTAN) 2 g in sodium chloride 0.9 % 100 mL IVPB        2 g 200 mL/hr over 30 Minutes Intravenous On call to O.R. 06/06/23 0555 06/06/23 0818        Assessment/Plan Gastric outlet obstruction due to chronic peptic ulcer disease  -POD#9 s/p Exploratory laparotomy, Distal gastrectomy with Roux-en-Y gastrojejunostomy, Placement of 24-French gastric tube, Placement of 18-French jejunostomy tube 9/20 Dr. Andrey Campanile - Surgical path negative for H pylori and negative for malignancy - Continue IV tylenol and IV robaxin. Offered PCA 9/24. Cont PRN Dilaudid. Avoid NSAIDs - Cont G-tube to gravity drainage, strict I/O, flushes Q4hrs. G-tube leaking but appears to be in appropriate position - I am able to flush and pull back bilious material  - Scheduled Reglan. Continue PRN anti-emetics. QT/QTc 384/437 ms 9/24 - Patient having bowel function, restart TF at 20 cc/h and advance as tolerated -  may need another suture replaced around G-tube vs bag around to catch drainage if still leaking  - desitin to skin  - Labs ok although calcium low  - Mobilize, PT - no fu recommended  - No whole or crushed meds via J-tube to avoid clogging   ID - Cefotetan periop. None currently. Afebrile. WBC 12 from 10, labs pending  FEN - IVF, G-tube to gravity, NPO except ice chips, restart TF VTE - SCDs, Lovenox Foley - out and voiding   HTN - PRN meds while NPO Hx Seizures - Home meds    LOS: 9 days    Juliet Rude, [facility] Surgery 06/15/2023, 8:45 AM Please see Amion for pager number during day hours 7:00am-4:30pm

## 2023-06-16 ENCOUNTER — Inpatient Hospital Stay (HOSPITAL_COMMUNITY): Payer: 59

## 2023-06-16 LAB — CBC
HCT: 31 % — ABNORMAL LOW (ref 36.0–46.0)
Hemoglobin: 10 g/dL — ABNORMAL LOW (ref 12.0–15.0)
MCH: 29.8 pg (ref 26.0–34.0)
MCHC: 32.3 g/dL (ref 30.0–36.0)
MCV: 92.3 fL (ref 80.0–100.0)
Platelets: 622 10*3/uL — ABNORMAL HIGH (ref 150–400)
RBC: 3.36 MIL/uL — ABNORMAL LOW (ref 3.87–5.11)
RDW: 13.5 % (ref 11.5–15.5)
WBC: 14.6 10*3/uL — ABNORMAL HIGH (ref 4.0–10.5)
nRBC: 0 % (ref 0.0–0.2)

## 2023-06-16 LAB — BASIC METABOLIC PANEL
Anion gap: 11 (ref 5–15)
BUN: 9 mg/dL (ref 8–23)
CO2: 21 mmol/L — ABNORMAL LOW (ref 22–32)
Calcium: 7.6 mg/dL — ABNORMAL LOW (ref 8.9–10.3)
Chloride: 99 mmol/L (ref 98–111)
Creatinine, Ser: 0.56 mg/dL (ref 0.44–1.00)
GFR, Estimated: >60 mL/min (ref >60)
Glucose, Bld: 112 mg/dL — ABNORMAL HIGH (ref 70–99)
Potassium: 3.8 mmol/L (ref 3.5–5.1)
Sodium: 131 mmol/L — ABNORMAL LOW (ref 135–145)

## 2023-06-16 MED ORDER — IOHEXOL 300 MG/ML  SOLN
50.0000 mL | Freq: Once | INTRAMUSCULAR | Status: AC | PRN
  Administered 2023-06-16: 30 mL

## 2023-06-16 MED ORDER — ACETAMINOPHEN 10 MG/ML IV SOLN
1000.0000 mg | Freq: Four times a day (QID) | INTRAVENOUS | Status: AC | PRN
  Administered 2023-06-16: 1000 mg via INTRAVENOUS
  Filled 2023-06-16: qty 100

## 2023-06-16 MED ORDER — SUCRALFATE 1 GM/10ML PO SUSP
1.0000 g | Freq: Three times a day (TID) | ORAL | Status: DC
  Administered 2023-06-16 – 2023-06-25 (×33): 1 g
  Filled 2023-06-16 (×35): qty 10

## 2023-06-16 MED ORDER — ZINC OXIDE 40 % EX OINT
TOPICAL_OINTMENT | Freq: Two times a day (BID) | CUTANEOUS | Status: DC
  Administered 2023-06-27: 40 via TOPICAL
  Administered 2023-06-29 – 2023-07-10 (×2): 1 via TOPICAL
  Filled 2023-06-16 (×3): qty 57

## 2023-06-16 MED ORDER — OSMOLITE 1.2 CAL PO LIQD
1000.0000 mL | ORAL | Status: DC
  Administered 2023-06-16 – 2023-06-18 (×2): 1000 mL
  Filled 2023-06-16 (×5): qty 1000

## 2023-06-16 NOTE — Progress Notes (Signed)
Nutrition Follow-up  DOCUMENTATION CODES:   Severe malnutrition in context of chronic illness, Underweight  INTERVENTION:   TF per J-tube  Osmolite 1.2 at 28ml/h, increase by 10ml q12h to a goal rate of 57ml/h ( per day).  Provides 1728 kcal, 80g protein, free water daily.  Continue Vitamin D 1,000 units daily.  When enteral nutrition is tolerated at goal, will need to begin bariatric vitamin supplementation: MVI x 2 (can be crushed and given via tube) and Calcium carbonate 500 mg TID.   Continue Reglan.  NUTRITION DIAGNOSIS:   Severe Malnutrition related to chronic illness (PUD, gastroparesis, esophagitis, duodenal bulb stenosis) as evidenced by severe fat depletion, severe muscle depletion.  Ongoing   GOAL:   Patient will meet greater than or equal to 90% of their needs  Progressing   MONITOR:   TF tolerance, Labs, Weight trends  REASON FOR ASSESSMENT:   Consult Enteral/tube feeding initiation and management (Resume TF at 44ml/hr)  ASSESSMENT:   67 yo female admitted with GOO d/t chronic PUD. S/P distal gastrectomy with roux en y gastrojejunostomy and placement of gastric and jejunal feeding tubes 9/20. PMH includes PUD, gastroparesis, duodenal diverticulum, duodenal bulb stenosis, esophagitis, GERD, hiatal hernia, gallstones, meningioma, HTN, seizures, B-12 deficiency.  Patient had 2 episodes of bilious emesis 9/28. Currently receiving TF via J-tube: Osmolite 1.2 at 30 ml/h. Received MD Consult for TF advancement toward goal. G-tube to gravity with 700 ml output x 24 hours. Patient has some drainage from G-tube. Plans for x-ray with water soluble contrast to confirm G-tube location today.  Remains NPO.   Labs reviewed. Na 131  Medications reviewed and include vitamin D3 1,000 units daily, Reglan q 6 hours IV, Protonix, Carafate.  IVF: D5LR with 20 mEq KCl/L at 75 ml/h  Weight 45.5 kg today Admission weight 47.6 kg (9/20)  Diet Order:   Diet  Order             Diet NPO time specified Except for: Ice Chips, Other (See Comments)  Diet effective now                   EDUCATION NEEDS:   Education needs have been addressed  Skin:  Skin Assessment: Skin Integrity Issues: Skin Integrity Issues:: Incisions Incisions: surgical incision to abdomen  Last BM:  9/29  Height:   Ht Readings from Last 1 Encounters:  06/06/23 5\' 4"  (1.626 m)    Weight:   Wt Readings from Last 1 Encounters:  06/16/23 45.4 kg    Ideal Body Weight:  54.5 kg  BMI:  Body mass index is 17.18 kg/m.  Estimated Nutritional Needs:   Kcal:  1650-1850  Protein:  70-90 gm  Fluid:  1.7-1.9 L   Gabriel Rainwater RD, LDN, CNSC Please refer to Amion for contact information.

## 2023-06-16 NOTE — Progress Notes (Signed)
Occupational Therapy Treatment Patient Details Name: Lindsay Frost MRN: [id number] DOB: 1956-04-13 Today's Date: 06/16/2023   History of present illness 67 y.o. 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 PMH: back pain, cholecystectomy, gastric ulcer, depression.   OT comments  Patient with encouragement to get OOB. Moving well with supervision, min cueing for safety with IV pole mgmt. Supervision for ADLs. Pt reports "getting tangled up" and feeling "off" when taking her pain medications but otherwise reports baseline.  Believe she would benefit from further cognitive assessment. Continue per POC.       If plan is discharge home, recommend the following:  Assistance with cooking/housework;Assist for transportation   Equipment Recommendations  BSC/3in1    Recommendations for Other Services      Precautions / Restrictions Precautions Precautions: Fall Precaution Comments: abdominal surgery Restrictions Weight Bearing Restrictions: No       Mobility Bed Mobility Overal bed mobility: Modified Independent             General bed mobility comments: no assist required    Transfers Overall transfer level: Modified independent                       Balance Overall balance assessment: Mild deficits observed, not formally tested                                         ADL either performed or assessed with clinical judgement   ADL Overall ADL's : Needs assistance/impaired     Grooming: Supervision/safety;Standing           Upper Body Dressing : Supervision/safety;Standing   Lower Body Dressing: Supervision/safety;Sit to/from stand   Toilet Transfer: Supervision/safety;Ambulation Toilet Transfer Details (indicate cue type and reason): IV pole support          Functional mobility during ADLs: Supervision/safety      Extremity/Trunk Assessment              Vision       Perception     Praxis      Cognition Arousal: Alert Behavior During Therapy: WFL for tasks assessed/performed, Flat affect Overall Cognitive Status: Within Functional Limits for tasks assessed                                 General Comments: pt following commands and engaging appripriately.  She reports feeling frustrated with "her tube leaking", some decreased awareness of safety as she reports "I get tangled up sometimes" but reports this is only when she takes the pain medications- would benefit from further cog assessment        Exercises      Shoulder Instructions       General Comments encouargement to participate and get OOB    Pertinent Vitals/ Pain       Pain Assessment Pain Assessment: Faces Faces Pain Scale: Hurts little more Pain Location: abdomen and back Pain Descriptors / Indicators: Grimacing Pain Intervention(s): Limited activity within patient's tolerance, Monitored during session, Repositioned  Home Living  Prior Functioning/Environment              Frequency  Min 1X/week        Progress Toward Goals  OT Goals(current goals can now be found in the care plan section)  Progress towards OT goals: Progressing toward goals  Acute Rehab OT Goals Patient Stated Goal: get better OT Goal Formulation: With patient Time For Goal Achievement: 06/26/23 Potential to Achieve Goals: Good  Plan      Co-evaluation                 AM-PAC OT "6 Clicks" Daily Activity     Outcome Measure   Help from another person eating meals?: None Help from another person taking care of personal grooming?: A Little Help from another person toileting, which includes using toliet, bedpan, or urinal?: A Little Help from another person bathing (including washing,  rinsing, drying)?: A Little Help from another person to put on and taking off regular upper body clothing?: A Little Help from another person to put on and taking off regular lower body clothing?: A Little 6 Click Score: 19    End of Session    OT Visit Diagnosis: Unsteadiness on feet (R26.81);Muscle weakness (generalized) (M62.81);Other symptoms and signs involving cognitive function   Activity Tolerance Patient tolerated treatment well   Patient Left in chair;with call bell/phone within reach   Nurse Communication Mobility status (dressing off incision)        Time: 1610-9604 OT Time Calculation (min): 14 min  Charges: OT General Charges $OT Visit: 1 Visit OT Treatments $Self Care/Home Management : 8-22 mins  Barry Brunner, OT Acute Rehabilitation Services Office [phone]   Lindsay Frost 06/16/2023, 1:49 PM

## 2023-06-16 NOTE — Progress Notes (Signed)
Mobility Specialist: Progress Note   06/16/23 1612  Mobility  Activity Ambulated independently in hallway  Level of Assistance Modified independent, requires aide device or extra time  Assistive Device Other (Comment) (IV pole, HHA)  Distance Ambulated (ft) 175 ft  Activity Response Tolerated well  Mobility Referral Yes  $Mobility charge 1 Mobility  Mobility Specialist Start Time (ACUTE ONLY) 1140  Mobility Specialist Stop Time (ACUTE ONLY) 1158  Mobility Specialist Time Calculation (min) (ACUTE ONLY) 18 min    Pt was agreeable to mobility session - received in bed. ModI throughout. No complaints and asymptomatic. Ambulating initially with just IV pole, but requested additional HHA for extra support on L side. Returned to room without fault. Left in bed with all needs met, call bell in reach.  Maurene Capes Mobility Specialist Please contact via SecureChat or Rehab office at [phone]

## 2023-06-16 NOTE — Progress Notes (Addendum)
Patient ID: Lindsay Frost, female   DOB: February 16, 1956, 67 y.o.   MRN: [id number] [facility] Surgery Progress Note  10 Days Post-Op  Subjective: Seen with RN's.  Patient reports pain in her upper abdomen and around G-tube. Continues to have leakage around G-tube, especially with movement. Had 600cc bilious drainage out from G-tube over the last 24 hours but was reported to have minimal drainage this am so drainage bag was changed. She has scant bilious output in G-tube currently.  Back on TF's, currently at 30cc/hr. No further n/v yesterday but is having some nausea today. Passing flatus. Last BM 9/28. Voiding without issues. Mobilized 142ft w/ IV pole yesterday.   Objective: Vital signs in last 24 hours: Temp:  [98 F (36.7 C)-99.2 F (37.3 C)] 98.4 F (36.9 C) (09/30 0717) Pulse Rate:  [82-98] 82 (09/30 0717) Resp:  [16-18] 16 (09/30 0717) BP: (101-116)/(48-71) 102/64 (09/30 0717) SpO2:  [95 %-99 %] 95 % (09/30 0717) Weight:  [45.4 kg] 45.4 kg (09/30 0500) Last BM Date : 06/14/23  Intake/Output from previous day: 09/29 0701 - 09/30 0700 In: 90  Out: 700 [Drains:700] Intake/Output this shift: No intake/output data recorded.  PE: Gen:  Alert, NAD Lungs: Normal rate and effort.  Abd: Soft, mild distension. Diffuse upper abdominal ttp as well as ttp over the G-tube site. G-tube site with bilious drainage on split gauze - removed without active drainage. There is some very localized irritation, erythema and induration around the G-tube site. G-tube is currently to gravity with scant bilious drainage in gravity bag. Initially could pull back bilious material and flush but on further attempts could not. Midline cdi with staples in place. J tube with tf's at 30cc/hr.   Lab Results:  Recent Labs    06/13/23 1244 06/15/23 1009  WBC 12.5* 16.2*  HGB 11.0* 10.8*  HCT 33.2* 32.1*  PLT 520* 625*   BMET Recent Labs    06/13/23 1244 06/15/23 1009  NA 132* 133*  K 3.8 4.1  CL  105 101  CO2 22 23  GLUCOSE 117* 103*  BUN 11 11  CREATININE 0.42* 0.42*  CALCIUM 7.7* 7.9*   PT/INR No results for input(s): "LABPROT", "INR" in the last 72 hours. CMP     Component Value Date/Time   NA 133 (L) 06/15/2023 1009   NA 144 04/13/2021 1544   K 4.1 06/15/2023 1009   CL 101 06/15/2023 1009   CO2 23 06/15/2023 1009   GLUCOSE 103 (H) 06/15/2023 1009   BUN 11 06/15/2023 1009   BUN 15 04/13/2021 1544   CREATININE 0.42 (L) 06/15/2023 1009   CREATININE 1.08 (H) 02/16/2021 1506   CALCIUM 7.9 (L) 06/15/2023 1009   PROT 6.6 05/30/2023 1200   ALBUMIN 3.4 (L) 05/30/2023 1200   AST 18 05/30/2023 1200   ALT 11 05/30/2023 1200   ALKPHOS 47 05/30/2023 1200   BILITOT <0.1 (L) 05/30/2023 1200   GFRNONAA >60 06/15/2023 1009   GFRNONAA >89 01/17/2016 1424   GFRAA 67 07/25/2020 1427   GFRAA >89 01/17/2016 1424   Lipase     Component Value Date/Time   LIPASE 24 02/16/2021 1506       Studies/Results: No results found.  Anti-infectives: Anti-infectives (From admission, onward)    Start     Dose/Rate Route Frequency Ordered Stop   06/06/23 0600  cefoTEtan (CEFOTAN) 2 g in sodium chloride 0.9 % 100 mL IVPB        2 g 200 mL/hr over 30 Minutes  Intravenous On call to O.R. 06/06/23 0555 06/06/23 0818        Assessment/Plan Gastric outlet obstruction due to chronic peptic ulcer disease  -POD#10 s/p Exploratory laparotomy, Distal gastrectomy with Roux-en-Y gastrojejunostomy, Placement of 24-French gastric tube, Placement of 18-French jejunostomy tube 9/20 Dr. Andrey Campanile - Surgical path negative for H pylori and negative for malignancy - Cont PPI - Continue IV tylenol and IV robaxin. Offered PCA 9/24. Cont PRN Dilaudid. Avoid NSAIDs - Scheduled Reglan. Continue PRN anti-emetics. QT/QTc 384/437 ms 9/24.  - Patient having bowel function, no further vomiting, cont TF's via J-tube and advance as tolerated. No whole or crushed meds via J-tube to avoid clogging - Will get xray  with water soluble contrast to confirm G-tube location - spoke with radiology about this to specify order. If in proper position will add carafate. May need another stitch vs bag around to catch drainage if in proper position in continues to leak. Tape tube to skin + add abdominal binder to avoid tension/pulling on G-tube. Add Desitin to skin - Mobilize, PT - no fu recommended  - Repeat labs today.   ID - Cefotetan periop. None currently. Afebrile. WBC 10.7 > 12.5 > 16.2. Repeat labs pending.  FEN - IVF, G-tube to gravity, NPO, TF's as above.  VTE - SCDs, Lovenox Foley - out and voiding   HTN - PRN meds while NPO Hx Seizures - Home meds   LOS: 10 days    Jacinto Halim, [facility] Surgery 06/16/2023, 9:17 AM Please see Amion for pager number during day hours 7:00am-4:30pm

## 2023-06-16 NOTE — Plan of Care (Signed)
  Problem: Education: Goal: Knowledge of General Education information will improve Description: Including pain rating scale, medication(s)/side effects and non-pharmacologic comfort measures Outcome: Progressing   Problem: Clinical Measurements: Goal: Respiratory complications will improve Outcome: Progressing   

## 2023-06-16 NOTE — Plan of Care (Signed)
  Problem: Health Behavior/Discharge Planning: Goal: Ability to manage health-related needs will improve Outcome: Progressing   Problem: Activity: Goal: Risk for activity intolerance will decrease Outcome: Progressing   Problem: Nutrition: Goal: Adequate nutrition will be maintained Outcome: Progressing   Problem: Coping: Goal: Level of anxiety will decrease Outcome: Progressing   Problem: Pain Managment: Goal: General experience of comfort will improve Outcome: Not Progressing   Problem: Safety: Goal: Ability to remain free from injury will improve Outcome: Progressing   Problem: Skin Integrity: Goal: Risk for impaired skin integrity will decrease Outcome: Progressing

## 2023-06-17 ENCOUNTER — Inpatient Hospital Stay (HOSPITAL_COMMUNITY): Payer: 59

## 2023-06-17 LAB — BASIC METABOLIC PANEL
Anion gap: 6 (ref 5–15)
BUN: 8 mg/dL (ref 8–23)
CO2: 25 mmol/L (ref 22–32)
Calcium: 7.7 mg/dL — ABNORMAL LOW (ref 8.9–10.3)
Chloride: 103 mmol/L (ref 98–111)
Creatinine, Ser: 0.48 mg/dL (ref 0.44–1.00)
GFR, Estimated: >60 mL/min (ref >60)
Glucose, Bld: 111 mg/dL — ABNORMAL HIGH (ref 70–99)
Potassium: 3.8 mmol/L (ref 3.5–5.1)
Sodium: 134 mmol/L — ABNORMAL LOW (ref 135–145)

## 2023-06-17 LAB — CBC
HCT: 30.6 % — ABNORMAL LOW (ref 36.0–46.0)
Hemoglobin: 9.9 g/dL — ABNORMAL LOW (ref 12.0–15.0)
MCH: 29.6 pg (ref 26.0–34.0)
MCHC: 32.4 g/dL (ref 30.0–36.0)
MCV: 91.3 fL (ref 80.0–100.0)
Platelets: 640 10*3/uL — ABNORMAL HIGH (ref 150–400)
RBC: 3.35 MIL/uL — ABNORMAL LOW (ref 3.87–5.11)
RDW: 13.2 % (ref 11.5–15.5)
WBC: 15.2 10*3/uL — ABNORMAL HIGH (ref 4.0–10.5)
nRBC: 0 % (ref 0.0–0.2)

## 2023-06-17 MED ORDER — SODIUM BICARBONATE 650 MG PO TABS
650.0000 mg | ORAL_TABLET | Freq: Once | ORAL | Status: AC
  Administered 2023-06-17: 650 mg
  Filled 2023-06-17: qty 1

## 2023-06-17 MED ORDER — IOHEXOL 9 MG/ML PO SOLN
1000.0000 mL | ORAL | Status: AC
  Administered 2023-06-17 (×2): 1000 mL via ORAL

## 2023-06-17 MED ORDER — PANCRELIPASE (LIP-PROT-AMYL) 10440-39150 UNITS PO TABS
20880.0000 [IU] | ORAL_TABLET | Freq: Once | ORAL | Status: AC
  Administered 2023-06-17: 20880 [IU]
  Filled 2023-06-17: qty 2

## 2023-06-17 MED ORDER — LEVETIRACETAM 500 MG PO TABS: 1000.0000 mg | ORAL_TABLET | Freq: Two times a day (BID) | ORAL | Status: DC

## 2023-06-17 MED ORDER — VITAMIN D 25 MCG (1000 UNIT) PO TABS
1000.0000 [IU] | ORAL_TABLET | Freq: Every day | ORAL | Status: DC
  Filled 2023-06-17: qty 1

## 2023-06-17 MED ORDER — LEVETIRACETAM IN NACL 1000 MG/100ML IV SOLN
1000.0000 mg | Freq: Two times a day (BID) | INTRAVENOUS | Status: DC
  Administered 2023-06-17 – 2023-07-10 (×45): 1000 mg via INTRAVENOUS
  Filled 2023-06-17 (×47): qty 100

## 2023-06-17 MED ORDER — IOHEXOL 350 MG/ML SOLN
75.0000 mL | Freq: Once | INTRAVENOUS | Status: AC | PRN
  Administered 2023-06-17: 75 mL via INTRAVENOUS

## 2023-06-17 MED ORDER — CHOLECALCIFEROL 10 MCG/ML (400 UNIT/ML) PO LIQD
1000.0000 [IU] | Freq: Every day | ORAL | Status: DC
  Administered 2023-06-19: 1000 [IU]
  Filled 2023-06-17 (×4): qty 2.5

## 2023-06-17 NOTE — Progress Notes (Addendum)
Physical Therapy Treatment Patient Details Name: Lindsay Frost MRN: [id number] DOB: [birth year] Today's Date: 06/17/2023   History of Present Illness 67 y.o. 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 PMH: back pain, cholecystectomy, gastric ulcer, depression.    PT Comments  Pt agreeable to therapy.  Pt amb w/ IV pole only after initially reaching for PT assist and verbal cues for upright posture and L arm swing.  Pt remained sitting in recliner w/ all needs in reach and LES elevated.   If plan is discharge home, recommend the following: A little help with walking and/or transfers;A little help with bathing/dressing/bathroom;Assistance with cooking/housework;Assist for transportation   Can travel by private vehicle        Equipment Recommendations       Recommendations for Other Services       Precautions / Restrictions Precautions Precautions: Fall Precaution Comments: abdominal surgery Restrictions Weight Bearing Restrictions: No     Mobility  Bed Mobility Overal bed mobility: Modified Independent                  Transfers   Equipment used: None               General transfer comment: cues for upright posture f and hand placement, step-pivot recliner <> w/c    Ambulation/Gait Ambulation/Gait assistance: Contact guard assist Gait Distance (Feet): 200 Feet Assistive device: IV Pole, None, 1 person hand held assist (initially reaches for L HHA, but then encouraged to amb w/ L arm swing.) Gait Pattern/deviations: Step-through pattern, Decreased step length - right, Decreased step length - left, Narrow base of support Gait velocity: slowed     General Gait Details: no LOB today      Balance Overall balance assessment: Mild deficits observed, not formally tested                    Cognition Arousal: Alert Behavior During Therapy: WFL for tasks assessed/performed, Flat affect           Problem Solving: Requires verbal cues General Comments: agreeable to therapy, very particular w/ equipment placement.        Exercises      General Comments        Pertinent Vitals/Pain Pain Assessment Pain Assessment: 0-10 Pain Score: 7  Pain Location: abdomen and back Pain Descriptors / Indicators: Grimacing, Guarding Pain Intervention(s): Monitored during session    Home Living                          Prior Function            PT Goals (current goals can now be found in the care plan section) Acute Rehab PT Goals Time For Goal Achievement: 06/26/23 Potential to Achieve Goals: Fair Progress towards PT goals: Progressing toward goals    Frequency           PT Plan      Co-evaluation              AM-PAC PT "6 Clicks" Mobility   Outcome Measure  Help needed turning from your back to your side while in a flat bed without using bedrails?: None Help needed moving from lying on your back to sitting on the side of a flat bed without using bedrails?: None Help needed moving to and  from a bed to a chair (including a wheelchair)?: None Help needed standing up from a chair using your arms (e.g., wheelchair or bedside chair)?: None Help needed to walk in hospital room?: None Help needed climbing 3-5 steps with a railing? : A Little 6 Click Score: 23    End of Session   Activity Tolerance: Other (comment) (pt able to perform but then c/o fatigue at conclusion, remained in recliner w/ legs elevated and all needs in reach.) Patient left: in chair;with call bell/phone within reach   PT Visit Diagnosis: Unsteadiness on feet (R26.81);Other abnormalities of gait and mobility (R26.89);Muscle weakness (generalized) (M62.81);Pain     Time: 4098-1191 PT Time Calculation (min) (ACUTE ONLY): 27 min  Charges:   PT  session $Gait Training: 8-22 mins $Therapeutic Activity: 8-22 mins                       Lucio Edward, PT    Lucio Edward 06/17/2023, 10:12 AM

## 2023-06-17 NOTE — Plan of Care (Signed)
  Problem: Clinical Measurements: Goal: Respiratory complications will improve Outcome: Progressing   Problem: Activity: Goal: Risk for activity intolerance will decrease Outcome: Progressing   Problem: Elimination: Goal: Will not experience complications related to bowel motility Outcome: Progressing Goal: Will not experience complications related to urinary retention Outcome: Progressing   Problem: Safety: Goal: Ability to remain free from injury will improve Outcome: Progressing

## 2023-06-17 NOTE — Progress Notes (Signed)
GT putting out large amts of green/brown drainage at insertion site. Dsg have been changed several times this shift with ABD pad.  Skin around insertion site pinkish red.

## 2023-06-17 NOTE — Progress Notes (Signed)
Pt started to c/o of pain when flush j-tube. Was able to give sucralfate, flush 30cc, but try to give contrast unable to advance. Notified charge, see if she could trouble shoot. Page Hosie Spangle, PA, and Angelena Form MD.   [phone]- Dr. Cliffton Asters called back, wanted me to page Carlena Bjornstad, PA. She place protocol orders for clog tube. Pt j-tube unclogged and pt place back on TF at goal.

## 2023-06-17 NOTE — Progress Notes (Signed)
Patient ID: ADAH WEGNER, female   DOB: [birth year], 67 y.o.   MRN: [id number] [facility] Surgery Progress Note  11 Days Post-Op  Subjective: Seen with RN's. Patient reports abdominal pain only around her G-tube. Nausea is still present but improved. No vomiting. TF's at goal rate of 1ml/hr. Passing flatus. Large soft BM this am. Voiding without issues. Mobilized  141ft with staff yesterday. Still having leakage around G-tube. Reports dressing had to be changed 3-4x yesterday. Output from G-tube decreased to 15cc/24 hours.   Objective: Vital signs in last 24 hours: Temp:  [97.3 F (36.3 C)-98.8 F (37.1 C)] 97.3 F (36.3 C) (10/01 0743) Pulse Rate:  [86-96] 95 (10/01 0743) Resp:  [16-22] 17 (10/01 0743) BP: (103-135)/(56-77) 103/70 (10/01 0743) SpO2:  [94 %-100 %] 96 % (10/01 0743) Weight:  [46.5 kg] 46.5 kg (10/01 0528) Last BM Date : 06/14/23  Intake/Output from previous day: 09/30 0701 - 10/01 0700 In: 6335.4 [P.O.:100; I.V.:3476.7; NG/GT:660; IV Piggyback:2098.7] Out: 15 [Drains:15] Intake/Output this shift: No intake/output data recorded.  PE: Gen:  Alert, NAD Card: Reg Lungs: Normal rate and effort.  Abd: Soft, mild distension. TTP around the G-tube site but is otherwise NT. G-tube site with small amount of bilious drainage on split gauze - removed without active drainage. Stitch in place securing G-tube. There is some localized irritation, erythema and induration around the G-tube site. G-tube is currently to gravity with scant bilious drainage in gravity bag. Easily able to flush and pull back bilious material from G-tube without resistance. Midline cdi with staples in place. J tube with tf's at 60cc/hr.   Lab Results:  Recent Labs    06/16/23 1015 06/17/23 0241  WBC 14.6* 15.2*  HGB 10.0* 9.9*  HCT 31.0* 30.6*  PLT 622* 640*   BMET Recent Labs    06/16/23 1015 06/17/23 0241  NA 131* 134*  K 3.8 3.8  CL 99 103  CO2 21* 25  GLUCOSE 112* 111*  BUN 9  8  CREATININE 0.56 0.48  CALCIUM 7.6* 7.7*   PT/INR No results for input(s): "LABPROT", "INR" in the last 72 hours. CMP     Component Value Date/Time   NA 134 (L) 06/17/2023 0241   NA 144 04/13/2021 1544   K 3.8 06/17/2023 0241   CL 103 06/17/2023 0241   CO2 25 06/17/2023 0241   GLUCOSE 111 (H) 06/17/2023 0241   BUN 8 06/17/2023 0241   BUN 15 04/13/2021 1544   CREATININE 0.48 06/17/2023 0241   CREATININE 1.08 (H) 02/16/2021 1506   CALCIUM 7.7 (L) 06/17/2023 0241   PROT 6.6 05/30/2023 1200   ALBUMIN 3.4 (L) 05/30/2023 1200   AST 18 05/30/2023 1200   ALT 11 05/30/2023 1200   ALKPHOS 47 05/30/2023 1200   BILITOT <0.1 (L) 05/30/2023 1200   GFRNONAA >60 06/17/2023 0241   GFRNONAA >89 01/17/2016 1424   GFRAA 67 07/25/2020 1427   GFRAA >89 01/17/2016 1424   Lipase     Component Value Date/Time   LIPASE 24 02/16/2021 1506       Studies/Results: DG ABDOMEN PEG TUBE LOCATION  Result Date: 06/16/2023 CLINICAL DATA:  Peg tube malfunction. EXAM: ABDOMEN - 1 VIEW COMPARISON:  June 11, 2023. FINDINGS: Gastrostomy tube is seen with tip in gastric lumen. Normal contrast filling of stomach and small bowel is noted. No extravasation or leakage is noted. Jejunostomy tube is noted as well. No abnormal bowel dilatation. IMPRESSION: See above. Electronically Signed   By: Roque Lias  Jr M.D.   On: 06/16/2023 14:27    Anti-infectives: Anti-infectives (From admission, onward)    Start     Dose/Rate Route Frequency Ordered Stop   06/06/23 0600  cefoTEtan (CEFOTAN) 2 g in sodium chloride 0.9 % 100 mL IVPB        2 g 200 mL/hr over 30 Minutes Intravenous On call to O.R. 06/06/23 0555 06/06/23 0818        Assessment/Plan Gastric outlet obstruction due to chronic peptic ulcer disease  -POD#11 s/p Exploratory laparotomy, Distal gastrectomy with Roux-en-Y gastrojejunostomy, Placement of 24-French gastric tube, Placement of 18-French jejunostomy tube 9/20 Dr. Andrey Campanile - Surgical path  negative for H pylori and negative for malignancy - Cont PPI + Carafate  - Avoid NSAIDs - Scheduled Reglan. Continue PRN anti-emetics. QT/QTc 384/437 ms 9/24.  - TF's at goal via J-tube. Patient having bowel function. No whole or crushed meds via J-tube to avoid clogging - Having leakage around G-tube. Xray confirmed proper location of G-tube 9/30.  I am able to flush and pull back bilious material today. Cont Carafate, [place] around G-tube site and G-tube to gravity. Noted decreased output from G-tube over the last 24 hours. Unclear if this is because her stomach is starting to empty, leakage around the tube or combination. Will discuss with attending if we should place another suture around G-tube vs continue to monitor. Cont abdominal binder to avoid tension/pulling on G-tube.  - Mobilize, PT - no fu recommended  - Will obtain CT A/P today given persistently elevated WBC   ID - Cefotetan periop. None currently. Afebrile. WBC 15.2.  FEN - IVF, G-tube to gravity, NPO, TF's at goal VTE - SCDs, Lovenox Foley - out and voiding   HTN - PRN meds while NPO Hx Seizures - Home meds   LOS: 11 days    Jacinto Halim, [facility] Surgery 06/17/2023, 9:10 AM Please see Amion for pager number during day hours 7:00am-4:30pm

## 2023-06-17 NOTE — Progress Notes (Signed)
Case and CT reviewed with my bariatric colleague whom is on call this evening. Does not feel there is any role at present for emergent exploration unless things were to change overnight. There is swirling involving the loop of bowel containing the J tube. It appears related to the physical tube directing the loop of bowel down toward the pelvis. Clinically, she has not had any changes other changes. It does not appear there is any evidence at primary of secondary findings of mesenteric ischemia either. She has no tenderness on exam.   We will plan to review with Dr. Andrey Campanile tomorrow to determine next best steps in her case.  Lindsay Olp, MD [facility] Surgery, A [facility]

## 2023-06-17 NOTE — Progress Notes (Signed)
   06/17/23 1109  Mobility  Activity Ambulated with assistance in room;Transferred from chair to bed  Level of Assistance Contact guard assist, steadying assist  Assistive Device Other (Comment) (HHA)  Distance Ambulated (ft) 10 ft  Activity Response Tolerated well  Mobility Referral Yes  $Mobility charge 1 Mobility  Mobility Specialist Start Time (ACUTE ONLY) 1100  Mobility Specialist Stop Time (ACUTE ONLY) 1108  Mobility Specialist Time Calculation (min) (ACUTE ONLY) 8 min   Mobility Specialist: Progress Note  Pt agreeable to mobility session - received in chair. Required CG using HHA with slow gait. C/o gastric pain rated 6/10 . Returned to bed with all needs met - call bell within reach.   Barnie Mort, BS Mobility Specialist Please contact via SecureChat or Rehab office at [phone].

## 2023-06-18 ENCOUNTER — Encounter (HOSPITAL_COMMUNITY): Admission: RE | Disposition: A | Discharge status: Home Health | Payer: Self-pay | Source: Home / Self Care

## 2023-06-18 ENCOUNTER — Encounter (HOSPITAL_COMMUNITY): Payer: Self-pay

## 2023-06-18 ENCOUNTER — Inpatient Hospital Stay (HOSPITAL_COMMUNITY): Payer: 59 | Admitting: Anesthesiology

## 2023-06-18 ENCOUNTER — Other Ambulatory Visit: Payer: Self-pay

## 2023-06-18 DIAGNOSIS — K562 Volvulus: Secondary | ICD-10-CM | POA: Diagnosis not present

## 2023-06-18 HISTORY — PX: LAPAROTOMY: SHX154

## 2023-06-18 LAB — TYPE AND SCREEN
ABO/RH(D): O POS
Antibody Screen: NEGATIVE

## 2023-06-18 LAB — BASIC METABOLIC PANEL WITH GFR
Anion gap: 11 (ref 5–15)
BUN: 6 mg/dL — ABNORMAL LOW (ref 8–23)
CO2: 26 mmol/L (ref 22–32)
Calcium: 8.2 mg/dL — ABNORMAL LOW (ref 8.9–10.3)
Chloride: 99 mmol/L (ref 98–111)
Creatinine, Ser: 0.47 mg/dL (ref 0.44–1.00)
GFR, Estimated: >60 mL/min
Glucose, Bld: 111 mg/dL — ABNORMAL HIGH (ref 70–99)
Potassium: 4.1 mmol/L (ref 3.5–5.1)
Sodium: 136 mmol/L (ref 135–145)

## 2023-06-18 LAB — CBC
HCT: 32.4 % — ABNORMAL LOW (ref 36.0–46.0)
Hemoglobin: 10.4 g/dL — ABNORMAL LOW (ref 12.0–15.0)
MCH: 29.6 pg (ref 26.0–34.0)
MCHC: 32.1 g/dL (ref 30.0–36.0)
MCV: 92.3 fL (ref 80.0–100.0)
Platelets: 729 10*3/uL — ABNORMAL HIGH (ref 150–400)
RBC: 3.51 MIL/uL — ABNORMAL LOW (ref 3.87–5.11)
RDW: 13.2 % (ref 11.5–15.5)
WBC: 11.9 10*3/uL — ABNORMAL HIGH (ref 4.0–10.5)
nRBC: 0 % (ref 0.0–0.2)

## 2023-06-18 LAB — GLUCOSE, CAPILLARY
Glucose-Capillary: 180 mg/dL — ABNORMAL HIGH (ref 70–99)
Glucose-Capillary: 142 mg/dL — ABNORMAL HIGH (ref 70–99)

## 2023-06-18 LAB — MAGNESIUM: Magnesium: 1.6 mg/dL — ABNORMAL LOW (ref 1.7–2.4)

## 2023-06-18 LAB — PHOSPHORUS: Phosphorus: 4.2 mg/dL (ref 2.5–4.6)

## 2023-06-18 SURGERY — LAPAROTOMY, EXPLORATORY
Anesthesia: General | Site: Abdomen

## 2023-06-18 MED ORDER — CHLORHEXIDINE GLUCONATE 0.12 % MT SOLN
OROMUCOSAL | Status: AC
  Filled 2023-06-18: qty 15

## 2023-06-18 MED ORDER — DEXAMETHASONE SODIUM PHOSPHATE 10 MG/ML IJ SOLN
INTRAMUSCULAR | Status: AC
  Filled 2023-06-18: qty 1

## 2023-06-18 MED ORDER — ACETAMINOPHEN 10 MG/ML IV SOLN
1000.0000 mg | Freq: Once | INTRAVENOUS | Status: DC | PRN
  Administered 2023-06-18: 1000 mg via INTRAVENOUS

## 2023-06-18 MED ORDER — SUGAMMADEX SODIUM 200 MG/2ML IV SOLN
INTRAVENOUS | Status: DC | PRN
  Administered 2023-06-18: 200 mg via INTRAVENOUS

## 2023-06-18 MED ORDER — HYDROMORPHONE HCL 1 MG/ML IJ SOLN
0.2500 mg | INTRAMUSCULAR | Status: DC | PRN
  Administered 2023-06-18 (×4): 0.5 mg via INTRAVENOUS

## 2023-06-18 MED ORDER — ONDANSETRON HCL 4 MG/2ML IJ SOLN
INTRAMUSCULAR | Status: AC
  Filled 2023-06-18: qty 2

## 2023-06-18 MED ORDER — CHLORHEXIDINE GLUCONATE CLOTH 2 % EX PADS
6.0000 | MEDICATED_PAD | Freq: Every day | CUTANEOUS | Status: DC
  Administered 2023-06-18 – 2023-07-10 (×23): 6 via TOPICAL

## 2023-06-18 MED ORDER — ROCURONIUM BROMIDE 10 MG/ML (PF) SYRINGE
PREFILLED_SYRINGE | INTRAVENOUS | Status: DC | PRN
  Administered 2023-06-18: 20 mg via INTRAVENOUS
  Administered 2023-06-18: 40 mg via INTRAVENOUS

## 2023-06-18 MED ORDER — FENTANYL CITRATE (PF) 250 MCG/5ML IJ SOLN
INTRAMUSCULAR | Status: AC
  Filled 2023-06-18: qty 5

## 2023-06-18 MED ORDER — FENTANYL CITRATE (PF) 250 MCG/5ML IJ SOLN
INTRAMUSCULAR | Status: DC | PRN
  Administered 2023-06-18 (×5): 50 ug via INTRAVENOUS

## 2023-06-18 MED ORDER — DEXAMETHASONE SODIUM PHOSPHATE 10 MG/ML IJ SOLN
INTRAMUSCULAR | Status: DC | PRN
  Administered 2023-06-18: 5 mg via INTRAVENOUS

## 2023-06-18 MED ORDER — OSMOLITE 1.2 CAL PO LIQD
1000.0000 mL | ORAL | Status: DC
  Administered 2023-06-18: 1000 mL
  Filled 2023-06-18: qty 1000

## 2023-06-18 MED ORDER — HYDROMORPHONE HCL 1 MG/ML IJ SOLN
INTRAMUSCULAR | Status: AC
  Filled 2023-06-18: qty 1

## 2023-06-18 MED ORDER — PHENYLEPHRINE HCL (PRESSORS) 10 MG/ML IV SOLN
INTRAVENOUS | Status: AC
  Filled 2023-06-18: qty 1

## 2023-06-18 MED ORDER — SODIUM CHLORIDE 0.9 % IV SOLN
2.0000 g | Freq: Once | INTRAVENOUS | Status: AC
  Administered 2023-06-18: 2 g via INTRAVENOUS

## 2023-06-18 MED ORDER — ONDANSETRON HCL 4 MG/2ML IJ SOLN
INTRAMUSCULAR | Status: DC | PRN
  Administered 2023-06-18: 4 mg via INTRAVENOUS

## 2023-06-18 MED ORDER — ACETAMINOPHEN 10 MG/ML IV SOLN
INTRAVENOUS | Status: AC
  Filled 2023-06-18: qty 100

## 2023-06-18 MED ORDER — ORAL CARE MOUTH RINSE: 15.0000 mL | Freq: Once | OROMUCOSAL | Status: AC

## 2023-06-18 MED ORDER — 0.9 % SODIUM CHLORIDE (POUR BTL) OPTIME
TOPICAL | Status: DC | PRN
  Administered 2023-06-18: 2000 mL

## 2023-06-18 MED ORDER — STERILE WATER FOR IRRIGATION IR SOLN
Status: DC | PRN
Start: 2023-06-18 — End: 2023-06-18
  Administered 2023-06-18: 1000 mL

## 2023-06-18 MED ORDER — PROPOFOL 10 MG/ML IV BOLUS
INTRAVENOUS | Status: DC | PRN
  Administered 2023-06-18: 120 mg via INTRAVENOUS

## 2023-06-18 MED ORDER — ALBUMIN HUMAN 5 % IV SOLN
INTRAVENOUS | Status: DC | PRN
Start: 2023-06-18 — End: 2023-06-18

## 2023-06-18 MED ORDER — SUCCINYLCHOLINE CHLORIDE 200 MG/10ML IV SOSY
PREFILLED_SYRINGE | INTRAVENOUS | Status: DC | PRN
  Administered 2023-06-18: 100 mg via INTRAVENOUS

## 2023-06-18 MED ORDER — PHENYLEPHRINE 80 MCG/ML (10ML) SYRINGE FOR IV PUSH (FOR BLOOD PRESSURE SUPPORT)
PREFILLED_SYRINGE | INTRAVENOUS | Status: DC | PRN
  Administered 2023-06-18: 80 ug via INTRAVENOUS
  Administered 2023-06-18: 160 ug via INTRAVENOUS
  Administered 2023-06-18: 80 ug via INTRAVENOUS

## 2023-06-18 MED ORDER — CHLORHEXIDINE GLUCONATE 0.12 % MT SOLN
15.0000 mL | Freq: Once | OROMUCOSAL | Status: AC
  Administered 2023-06-18: 15 mL via OROMUCOSAL

## 2023-06-18 MED ORDER — LIDOCAINE 2% (20 MG/ML) 5 ML SYRINGE
INTRAMUSCULAR | Status: DC | PRN
  Administered 2023-06-18: 100 mg via INTRAVENOUS

## 2023-06-18 MED ORDER — SUCCINYLCHOLINE CHLORIDE 200 MG/10ML IV SOSY
PREFILLED_SYRINGE | INTRAVENOUS | Status: AC
  Filled 2023-06-18: qty 10

## 2023-06-18 MED ORDER — ONDANSETRON HCL 4 MG/2ML IJ SOLN
4.0000 mg | Freq: Once | INTRAMUSCULAR | Status: AC | PRN
  Administered 2023-06-18: 4 mg via INTRAVENOUS

## 2023-06-18 MED ORDER — LIDOCAINE 2% (20 MG/ML) 5 ML SYRINGE
INTRAMUSCULAR | Status: AC
  Filled 2023-06-18: qty 5

## 2023-06-18 MED ORDER — ROCURONIUM BROMIDE 10 MG/ML (PF) SYRINGE
PREFILLED_SYRINGE | INTRAVENOUS | Status: AC
  Filled 2023-06-18: qty 10

## 2023-06-18 MED ORDER — LACTATED RINGERS IV SOLN: INTRAVENOUS | Status: DC

## 2023-06-18 MED ORDER — ARTIFICIAL TEARS OPHTHALMIC OINT
TOPICAL_OINTMENT | OPHTHALMIC | Status: AC
  Filled 2023-06-18: qty 3.5

## 2023-06-18 MED ORDER — LIDOCAINE 5 % EX PTCH
1.0000 | MEDICATED_PATCH | CUTANEOUS | Status: DC
  Administered 2023-06-18 – 2023-07-10 (×20): 1 via TRANSDERMAL
  Filled 2023-06-18 (×23): qty 1

## 2023-06-18 MED ORDER — ACETAMINOPHEN 10 MG/ML IV SOLN: 1000.0000 mg | Freq: Four times a day (QID) | INTRAVENOUS | Status: DC | PRN

## 2023-06-18 MED ORDER — HYDROMORPHONE HCL 1 MG/ML IJ SOLN
0.5000 mg | INTRAMUSCULAR | Status: AC | PRN
  Administered 2023-06-18 (×2): 0.5 mg via INTRAVENOUS

## 2023-06-18 MED ORDER — MIDAZOLAM HCL 2 MG/2ML IJ SOLN
INTRAMUSCULAR | Status: AC
  Filled 2023-06-18: qty 2

## 2023-06-18 MED ORDER — SODIUM CHLORIDE 0.9 % IV SOLN
INTRAVENOUS | Status: AC
  Filled 2023-06-18: qty 2

## 2023-06-18 MED ORDER — PHENYLEPHRINE 80 MCG/ML (10ML) SYRINGE FOR IV PUSH (FOR BLOOD PRESSURE SUPPORT)
PREFILLED_SYRINGE | INTRAVENOUS | Status: AC
  Filled 2023-06-18: qty 10

## 2023-06-18 SURGICAL SUPPLY — 43 items
APL PRP STRL LF DISP 70% ISPRP (MISCELLANEOUS) ×1
BAG COUNTER SPONGE SURGICOUNT (BAG) ×1 IMPLANT
BAG SPNG CNTER NS LX DISP (BAG) ×1
BLADE CLIPPER SURG (BLADE) IMPLANT
CANISTER SUCT 3000ML PPV (MISCELLANEOUS) ×1 IMPLANT
CATH ROBINSON RED A/P 16FR (CATHETERS) IMPLANT
CHLORAPREP W/TINT 26 (MISCELLANEOUS) ×1 IMPLANT
COVER SURGICAL LIGHT HANDLE (MISCELLANEOUS) ×1 IMPLANT
DRAPE LAPAROSCOPIC ABDOMINAL (DRAPES) ×1 IMPLANT
DRAPE WARM FLUID 44X44 (DRAPES) ×1 IMPLANT
DRSG OPSITE POSTOP 4X8 (GAUZE/BANDAGES/DRESSINGS) IMPLANT
ELECT BLADE 6.5 EXT (BLADE) IMPLANT
ELECT REM PT RETURN 9FT ADLT (ELECTROSURGICAL) ×1
ELECTRODE REM PT RTRN 9FT ADLT (ELECTROSURGICAL) ×1 IMPLANT
GLOVE BIO SURGEON STRL SZ7.5 (GLOVE) ×1 IMPLANT
GLOVE INDICATOR 8.0 STRL GRN (GLOVE) ×1 IMPLANT
GOWN STRL REUS W/ TWL LRG LVL3 (GOWN DISPOSABLE) ×1 IMPLANT
GOWN STRL REUS W/ TWL XL LVL3 (GOWN DISPOSABLE) ×1 IMPLANT
GOWN STRL REUS W/TWL LRG LVL3 (GOWN DISPOSABLE) ×1
GOWN STRL REUS W/TWL XL LVL3 (GOWN DISPOSABLE) ×1
HANDLE SUCTION POOLE (INSTRUMENTS) ×1 IMPLANT
KIT BASIN OR (CUSTOM PROCEDURE TRAY) ×1 IMPLANT
KIT TURNOVER KIT B (KITS) ×1 IMPLANT
LIGASURE IMPACT 36 18CM CVD LR (INSTRUMENTS) IMPLANT
NS IRRIG 1000ML POUR BTL (IV SOLUTION) ×2 IMPLANT
PACK GENERAL/GYN (CUSTOM PROCEDURE TRAY) ×1 IMPLANT
PAD ARMBOARD 7.5X6 YLW CONV (MISCELLANEOUS) ×1 IMPLANT
PENCIL SMOKE EVACUATOR (MISCELLANEOUS) ×1 IMPLANT
SPECIMEN JAR LARGE (MISCELLANEOUS) IMPLANT
SPONGE DRAIN TRACH 4X4 STRL 2S (GAUZE/BANDAGES/DRESSINGS) IMPLANT
SPONGE T-LAP 18X18 ~~LOC~~+RFID (SPONGE) IMPLANT
STAPLER VISISTAT 35W (STAPLE) ×1 IMPLANT
SUCTION POOLE HANDLE (INSTRUMENTS) ×1
SUT ETHILON 2 0 FS 18 (SUTURE) IMPLANT
SUT PDS AB 1 TP1 96 (SUTURE) ×2 IMPLANT
SUT SILK 2 0 SH CR/8 (SUTURE) ×1 IMPLANT
SUT SILK 2 0 TIES 10X30 (SUTURE) ×1 IMPLANT
SUT SILK 3 0 SH CR/8 (SUTURE) ×1 IMPLANT
SUT SILK 3 0 TIES 10X30 (SUTURE) ×1 IMPLANT
TOWEL GREEN STERILE (TOWEL DISPOSABLE) ×1 IMPLANT
TRAY FOLEY MTR SLVR 16FR STAT (SET/KITS/TRAYS/PACK) ×1 IMPLANT
WATER STERILE IRR 1000ML POUR (IV SOLUTION) IMPLANT
YANKAUER SUCT BULB TIP NO VENT (SUCTIONS) IMPLANT

## 2023-06-18 NOTE — Plan of Care (Signed)

## 2023-06-18 NOTE — Progress Notes (Signed)
Brief Nutrition Note  Consult received for Enteral/tube feeding initiation and management per Adult Tube Feeding and Unclogging Feeding Tube Protocols. Comment of "Okay to start Trickle TF's on 10/2 post op. Do not advance past trickle tf's ".  Consult put in my Surgery.  Patient being followed by RD, last full assessment note 9/30.  Patient had a GJ tube. She is s/p removal of previous J-tube which was replaced with red rubber catheter today. G-tube remains in place.   Interventions: - Will place order to start trickle tube feeds per Surgery: Osmolite 1.2 at 51mL/hr   Unit RD to follow up with patient per protocol.   Shelle Iron RD, LDN For contact information, refer to [facility].

## 2023-06-18 NOTE — Op Note (Signed)
06/18/2023  12:51 PM  PATIENT:  Lindsay Frost  67 y.o. 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 Cliffton Asters, MD  ASSISTANT: Feliciana Rossetti, MD  ANESTHESIA:   general  COUNTS:  Sponge, needle and instrument counts were reported correct x2 at the conclusion of the operation.  EBL: 20 mL  DRAINS: Pre-existing j-tube remove and ultimately replaced with red rubber. G-tube remains in place.  SPECIMEN: None  COMPLICATIONS: None  FINDINGS: All bowel viable and healthy in appearance. Had to initially takedown J-tube site to better clarify anatomy and ensure no mesenteric twisting. J-J is patent to milking contents and to palpation but suspect between Witzel and 1 cc of fluid in balloon and additionally inspissated gastric contents that had made its way to her J-J this was obstructed. J-tube ultimately replaced with smaller caliber red rubber.    DISPOSITION: PACU in satisfactory condition  DESCRIPTION: The patient was identified in preop holding and taken to the OR where she was placed on the operating room table. SCDs were placed. General endotracheal anesthesia was induced without difficulty.  Pressure points were evaluated and padded.  She was then prepped and draped in the usual sterile fashion. A surgical timeout was performed indicating the correct patient, procedure, positioning and need for preoperative antibiotics.   Prior skin staples were removed.  The wound was able to be opened with gentle  traction.  PDS in the midline is identified and cut. The fascia is then able to be opened.  Kocher's were placed in the fascia.  The bowel was carefully evaluated.  There are no significant adhesions.  There are a couple thin adhesions which breakaway freely on manipulation.  No serosal injuries are present.  Were then able to meticulously evaluate the bowel.  We began at the ileocecal valve and ran the bowel proximally.  The J-tube was identified and in an appropriate position.  All small bowel up to the JJ is clearly viable.  We are unable to clearly identify where the JJ is however but do see a dilated loops that were able to traced back and find to be the biliopancreatic limb.  The J-tube is in unsutured location in the left abdomen to the abdominal wall.  This precludes visualization of the JJ anastomosis and a somewhat disorienting.  To better clarify her anatomy and ensure there is no clear internal hernias, we felt it would be best to take down the J-tube and evaluate the bowel in its normal anatomic figuration.  We are able to separate this abdominal wall and clipped the silk sutures in place.  The J-tube was ultimately removed after the balloon was deflated.  There was approximately 1 cc of fluid in the balloon.  We are then able to clearly visualize the JJ anastomosis.  We also traced the bowel up to the EGJ.  All bowel was clearly pink and viable and healthy in appearance.  The biliopancreatic limb was the most dilated.  All contents are able to now be milked across the JJ and into the jejunum.  We did use a pull tip sucker to evacuate much of  the BP limb.  It was felt that between the Witzel J-tube and a small amount of fluid in the balloon this being approximately 5 cm beyond her JJ anastomosis may have been causing a partial type obstruction.  Additionally, she had fairly inspissated contents coming down her GJ limb.  Were able to evacuate much of this.  Stomach is healthy in appearance.  The liver is  healthy in appearance.  No other abnormalities are noted in the abdomen.  The transverse colon and cecum/ascending colon are normal in appearance.  The sigmoid is normal in appearance.  The abdomen is irrigated.  We opted to then proceed with replacement of the J-tube at this time using a red rubber catheter.  This is fed through the abdominal wall.  This is then placed into the jejunum.  Some of the Witzel sutures that we had removed when taking down from the abdominal wall were carefully replaced.  We then milked contents from the BP limb across the JJ and these clearly passed down the jejunum towards the ileum without any evident obstruction or blockage.  This was secured back to the abdominal wall with 3-0 silk sutures.  The G-tube was secured to the skin with a 2-0 nylon stitch.  The G-tube was also resecured with a 2-0 nylon suture and the opening slightly cinched down to help prevent further reflux/efflux of contents.  The abdomen is irrigated.  Hemostasis is verified.  The midline fascia is then closed using 2 running #1 PDS sutures.  All sponge, needle, and instrument counts are reported correct.  The wound is then copiously irrigated with saline.  The skin is then reapproximated using staples.  The wound was washed and dried and a honeycomb applied.  Dressings were then placed around each individual tube as well.  She is then awakened from anesthesia, extubated, and transported to recovery in satisfactory condition.

## 2023-06-18 NOTE — Anesthesia Postprocedure Evaluation (Signed)
Anesthesia Post Note  Patient: Lindsay Frost  Procedure(s) Performed: EXPLORATION LAPAROTOMY, INSERTION OF JEJUNAL FEEDING TUBE (Abdomen)     Patient location during evaluation: PACU Anesthesia Type: General Level of consciousness: awake and alert Pain management: pain level controlled Vital Signs Assessment: post-procedure vital signs reviewed and stable Respiratory status: spontaneous breathing, nonlabored ventilation, respiratory function stable and patient connected to nasal cannula oxygen Cardiovascular status: blood pressure returned to baseline and stable Postop Assessment: no apparent nausea or vomiting Anesthetic complications: no  No notable events documented.  Last Vitals:  Vitals:   06/18/23 1345 06/18/23 1400  BP: 120/62 111/68  Pulse: (!) 114 (!) 108  Resp: (!) 21 19  Temp:  36.4 C  SpO2: 96% 96%    Last Pain:  Vitals:   06/18/23 1345  TempSrc:   PainSc: 9                  Joelle Roswell S

## 2023-06-18 NOTE — Progress Notes (Signed)
Patient ID: Lindsay Frost, female   DOB: 11/13/1955, 67 y.o.   MRN: [id number] [facility] Surgery Progress Note  Day of Surgery  Subjective: Seen with RN's. Patient reports abdominal pain only around her G-tube. No vomiting. TF's at goal rate of 90ml/hr. Passing flatus. Having BM. Voiding without issues.   Objective: Vital signs in last 24 hours: Temp:  [97.6 F (36.4 C)-98.6 F (37 C)] 98.1 F (36.7 C) (10/02 0953) Pulse Rate:  [84-91] 88 (10/02 0953) Resp:  [17-18] 18 (10/02 0953) BP: (113-137)/(59-68) 124/62 (10/02 0953) SpO2:  [95 %-100 %] 96 % (10/02 0953) Weight:  [46.5 kg-46.7 kg] 46.7 kg (10/02 0953) Last BM Date : 06/17/23  Intake/Output from previous day: 10/01 0701 - 10/02 0700 In: 3330 [I.V.:1318; WU/JW:1191; IV Piggyback:660] Out: 401 [Urine:301; Drains:100] Intake/Output this shift: No intake/output data recorded.  PE: Gen:  Alert, NAD Card: Reg Lungs: Normal rate and effort.  Abd: Soft, mild distension. TTP around the G-tube site but is otherwise NT. G-tube site clean now, dry. There is some localized irritation, erythema and induration around the G-tube site. G-tube is currently to gravity with scant bilious drainage in gravity bag. Easily able to flush and pull back bilious material from G-tube without resistance. Midline cdi with staples in place. J tube with tf's at 60cc/hr.   Lab Results:  Recent Labs    06/17/23 0241 06/18/23 0738  WBC 15.2* 11.9*  HGB 9.9* 10.4*  HCT 30.6* 32.4*  PLT 640* 729*   BMET Recent Labs    06/17/23 0241 06/18/23 0738  NA 134* 136  K 3.8 4.1  CL 103 99  CO2 25 26  GLUCOSE 111* 111*  BUN 8 6*  CREATININE 0.48 0.47  CALCIUM 7.7* 8.2*   PT/INR No results for input(s): "LABPROT", "INR" in the last 72 hours. CMP     Component Value Date/Time   NA 136 06/18/2023 0738   NA 144 04/13/2021 1544   K 4.1 06/18/2023 0738   CL 99 06/18/2023 0738   CO2 26 06/18/2023 0738   GLUCOSE 111 (H) 06/18/2023 0738    BUN 6 (L) 06/18/2023 0738   BUN 15 04/13/2021 1544   CREATININE 0.47 06/18/2023 0738   CREATININE 1.08 (H) 02/16/2021 1506   CALCIUM 8.2 (L) 06/18/2023 0738   PROT 6.6 05/30/2023 1200   ALBUMIN 3.4 (L) 05/30/2023 1200   AST 18 05/30/2023 1200   ALT 11 05/30/2023 1200   ALKPHOS 47 05/30/2023 1200   BILITOT <0.1 (L) 05/30/2023 1200   GFRNONAA >60 06/18/2023 0738   GFRNONAA >89 01/17/2016 1424   GFRAA 67 07/25/2020 1427   GFRAA >89 01/17/2016 1424   Lipase     Component Value Date/Time   LIPASE 24 02/16/2021 1506       Studies/Results: CT ABDOMEN PELVIS W CONTRAST  Result Date: 06/17/2023 CLINICAL DATA:  Abdominal pain EXAM: CT ABDOMEN AND PELVIS WITH CONTRAST TECHNIQUE: Multidetector CT imaging of the abdomen and pelvis was performed using the standard protocol following bolus administration of intravenous contrast. RADIATION DOSE REDUCTION: This exam was performed according to the departmental dose-optimization program which includes automated exposure control, adjustment of the mA and/or kV according to patient size and/or use of iterative reconstruction technique. CONTRAST:  75mL OMNIPAQUE IOHEXOL 350 MG/ML SOLN COMPARISON:  03/18/2023 FINDINGS: Lower chest: No pleural or pericardial effusion. Hepatobiliary: Cholecystectomy clips. Stable central and extrahepatic biliary ectasia. No focal liver lesion. Pancreas: Unremarkable. No pancreatic ductal dilatation or surrounding inflammatory changes. Spleen: Normal in size  without focal abnormality. Adrenals/Urinary Tract: No adrenal mass. Symmetric renal parenchymal enhancement without hydronephrosis. Multiple parapelvic cysts left greater than right as before. Urinary bladder partially distended. Stomach/Bowel: Interval distal gastrectomy. Malecot gastrostomy catheter within the gastric remnant. There is dilatation of the proximal duodenal remnant, and loops of proximal jejunum from the ligament of Treitz to the anastomotic staple line.  Balloon retention jejunostomy catheter into a decompressed loop of small bowel. There is whirling of mesentery, vessels, and the loop of bowel containing feeding tube around a mesenteric vascular pedicle in the right lower quadrant suggesting mesenteric volvulus, without convincing evidence of vascular compromise. The distal small bowel is decompressed. The colon is partially distended, without acute finding. Vascular/Lymphatic: No AAA. Portal vein patent. No abdominal or pelvic adenopathy. Reproductive: Uterus and bilateral adnexa are unremarkable. Other: No ascites. No free air. There is some subcutaneous gas around the gastrostomy catheter. Midline skin staples. Musculoskeletal: Grade 1 anterolisthesis L4-5 without pars defect, likely related to facet DJD. Degenerative disc disease L5-S1. Sacral Tarlov cyst, an anatomic variant. IMPRESSION: 1. Interval distal gastrectomy with gastrostomy and jejunostomy catheters. 2. Dilated proximal small bowel with whirling of mesentery and vessels in the right lower quadrant suggesting mesenteric volvulus. No evidence of vascular compromise. Electronically Signed   By: Corlis Leak M.D.   On: 06/17/2023 15:29   DG ABDOMEN PEG TUBE LOCATION  Result Date: 06/16/2023 CLINICAL DATA:  Peg tube malfunction. EXAM: ABDOMEN - 1 VIEW COMPARISON:  June 11, 2023. FINDINGS: Gastrostomy tube is seen with tip in gastric lumen. Normal contrast filling of stomach and small bowel is noted. No extravasation or leakage is noted. Jejunostomy tube is noted as well. No abnormal bowel dilatation. IMPRESSION: See above. Electronically Signed   By: Lupita Raider M.D.   On: 06/16/2023 14:27    Anti-infectives: Anti-infectives (From admission, onward)    Start     Dose/Rate Route Frequency Ordered Stop   06/06/23 0600  cefoTEtan (CEFOTAN) 2 g in sodium chloride 0.9 % 100 mL IVPB        2 g 200 mL/hr over 30 Minutes Intravenous On call to O.R. 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 Andrey Campanile - Surgical path negative for H pylori and negative for malignancy - Cont PPI + Carafate  - Avoid NSAIDs - Scheduled Reglan. Continue PRN anti-emetics. QT/QTc 384/437 ms 9/24.  - TF's at goal via J-tube. Patient having bowel function. No whole or crushed meds via J-tube to avoid clogging - Having leakage around G-tube. Xray confirmed proper location of G-tube 9/30.  [place] around G-tube site and G-tube to gravity. Noted decreased output from G-tube over the last 24 hours.   CT with G-tube contrast 06/17/23  1. Interval distal gastrectomy with gastrostomy and jejunostomy catheters. 2. Dilated proximal small bowel with whirling of mesentery and vessels in the right lower quadrant suggesting mesenteric volvulus. No evidence of vascular compromise.  As noted, we reviewed this CT scan yesterday evening with my partner Dr. Dossie Der and this morning with Dr. Sheliah Hatch.  She has no signs on imaging or physical exam of any clear vascular compromise or mesenteric ischemia but does have an evident twist of the mesentery and on imaging, has a dilated biliopancreatic limb as well as what appears to be contrast refluxing up this  limb all the way out to her duodenal staple line.  It may be that this twist is actually causing some degree of kinking of her common channel but nothing more distally as the bowel was all decompressed and she has been tolerating J-tube feeds without trouble.  Given these findings and now her interval timeframe from surgery, the windows going to close soon on reintervention.  We all feel that in her current state, she will be at risk for duodenal stump blowout particularly if she does have a obstruction near the common  channel.  Therefore, have discussed proceeding with reexploration today and further evaluation.  All this was detail with her today on our visit. We discussed exploratory laparotomy, possible bowel resection, all other indicated procedures.  We have again spent time discussing her postsurgical anatomy as well as the findings on imaging and rationale for reexploration.  We did discuss the risks of surgery at length (including, but not limited to, pain, bleeding, infection, injury to bowel, enterocutaneous fistula, wound infection, evisceration, hernia, need for additional procedures, worsening of pre-existing medical conditions, DVT/PE, pneumonia, heart attack, stroke, and death) benefits and alternatives.  As noted above, we feel the risks of no intervention carries a significant possibility for something like a duodenal stump blowout and therefore felt surgery offered her the lowest risk option at present.  All of her questions were answered to satisfaction, she expressed understanding and has agreed to proceed.  - Mobilize, PT - no fu recommended   ID - Cefotetan periop. None currently. Afebrile. WBC 15.2.  FEN - IVF, G-tube to gravity, NPO, TF's at goal VTE - SCDs, Lovenox Foley - out and voiding   HTN - PRN meds while NPO Hx Seizures - Home meds   LOS: 12 days   Marin Olp, MD [facility] Surgery, A [facility]

## 2023-06-18 NOTE — Progress Notes (Signed)
OT Cancellation Note  Patient Details Name: SYBRINA LANING MRN: [id number] DOB: 03/05/1956   Cancelled Treatment:    Reason Eval/Treat Not Completed: Patient at procedure or test/ unavailable. Will follow and see as able.   Barry Brunner, OT Acute Rehabilitation Services Office [phone]   Chancy Milroy 06/18/2023, 1:49 PM

## 2023-06-18 NOTE — Transfer of Care (Signed)
Immediate Anesthesia Transfer of Care Note  Patient: Lindsay Frost  Procedure(s) Performed: EXPLORATION LAPAROTOMY, INSERTION OF JEJUNAL FEEDING TUBE (Abdomen)  Patient Location: PACU  Anesthesia Type:General  Level of Consciousness: awake, alert , and oriented  Airway & Oxygen Therapy: Patient Spontanous Breathing and Patient connected to face mask oxygen  Post-op Assessment: Report given to RN and Post -op Vital signs reviewed and stable  Post vital signs: Reviewed and stable  Last Vitals:  Vitals Value Taken Time  BP 133/70 06/18/23 1330  Temp 36.1 C 06/18/23 1300  Pulse 112 06/18/23 1342  Resp 19 06/18/23 1342  SpO2 94 % 06/18/23 1342  Vitals shown include unfiled device data.  Last Pain:  Vitals:   06/18/23 1300  TempSrc:   PainSc: 10-Worst pain ever      Patients Stated Pain Goal: 0 (06/07/23 1827)  Complications: No notable events documented.

## 2023-06-18 NOTE — Progress Notes (Signed)
Called RD for assistance with J-tube and trickle feeds per order post surgery. RD was able to place connector and enfit lopez valve to the pt red tube.  Flush tube with 30 mL with water and connected Osmolite at 20 mL. Clean around g and j tube, replace split gauze. Able to flush G -tube with 20 mL of water. Educated pt about her heating pad and explain the risk of her back burning, family at bedside in agreement and understood as well. Will endorse to night RN about the TF per MD.

## 2023-06-18 NOTE — Anesthesia Preprocedure Evaluation (Signed)
Anesthesia Evaluation  Patient identified by MRN, date of birth, ID band Patient awake    Reviewed: Allergy & Precautions, H&P , NPO status , Patient's Chart, lab work & pertinent test results  Airway Mallampati: II  TM Distance: >3 FB Neck ROM: Full    Dental no notable dental hx.    Pulmonary neg pulmonary ROS   Pulmonary exam normal breath sounds clear to auscultation       Cardiovascular hypertension, negative cardio ROS Normal cardiovascular exam Rhythm:Regular Rate:Normal     Neuro/Psych Seizures -, Well Controlled,   negative psych ROS   GI/Hepatic Neg liver ROS, hiatal hernia,GERD  Medicated,,  Endo/Other  negative endocrine ROS    Renal/GU negative Renal ROS  negative genitourinary   Musculoskeletal negative musculoskeletal ROS (+)    Abdominal   Peds negative pediatric ROS (+)  Hematology  (+) Blood dyscrasia, anemia   Anesthesia Other Findings   Reproductive/Obstetrics negative OB ROS                             Anesthesia Physical Anesthesia Plan  ASA: 3  Anesthesia Plan: General   Post-op Pain Management:    Induction: Intravenous and Rapid sequence  PONV Risk Score and Plan: 3 and Ondansetron, Dexamethasone and Treatment may vary due to age or medical condition  Airway Management Planned: Oral ETT  Additional Equipment:   Intra-op Plan:   Post-operative Plan: Extubation in OR  Informed Consent: I have reviewed the patients History and Physical, chart, labs and discussed the procedure including the risks, benefits and alternatives for the proposed anesthesia with the patient or authorized representative who has indicated his/her understanding and acceptance.     Dental advisory given  Plan Discussed with: CRNA and Surgeon  Anesthesia Plan Comments:        Anesthesia Quick Evaluation

## 2023-06-18 NOTE — Anesthesia Procedure Notes (Signed)
Procedure Name: Intubation Date/Time: 06/18/2023 11:15 AM  Performed by: Mayer Camel, CRNAPre-anesthesia Checklist: Patient identified, Emergency Drugs available, Suction available and Patient being monitored Patient Re-evaluated:Patient Re-evaluated prior to induction Oxygen Delivery Method: Circle System Utilized Preoxygenation: Pre-oxygenation with 100% oxygen Induction Type: IV induction Ventilation: Mask ventilation without difficulty Laryngoscope Size: Miller and 2 Tube type: Oral Tube size: 7.0 mm Number of attempts: 1 Airway Equipment and Method: Stylet and Oral airway Placement Confirmation: ETT inserted through vocal cords under direct vision, positive ETCO2 and breath sounds checked- equal and bilateral Secured at: 21 cm Tube secured with: Tape Dental Injury: Teeth and Oropharynx as per pre-operative assessment

## 2023-06-19 ENCOUNTER — Encounter (HOSPITAL_COMMUNITY): Payer: Self-pay | Admitting: Surgery

## 2023-06-19 LAB — PHOSPHORUS
Phosphorus: 3.8 mg/dL (ref 2.5–4.6)
Phosphorus: 3.9 mg/dL (ref 2.5–4.6)

## 2023-06-19 LAB — GLUCOSE, CAPILLARY
Glucose-Capillary: 128 mg/dL — ABNORMAL HIGH (ref 70–99)
Glucose-Capillary: 107 mg/dL — ABNORMAL HIGH (ref 70–99)
Glucose-Capillary: 111 mg/dL — ABNORMAL HIGH (ref 70–99)
Glucose-Capillary: 131 mg/dL — ABNORMAL HIGH (ref 70–99)
Glucose-Capillary: 127 mg/dL — ABNORMAL HIGH (ref 70–99)

## 2023-06-19 LAB — MAGNESIUM
Magnesium: 1.6 mg/dL — ABNORMAL LOW (ref 1.7–2.4)
Magnesium: 2.2 mg/dL (ref 1.7–2.4)

## 2023-06-19 MED ORDER — OSMOLITE 1.2 CAL PO LIQD
1000.0000 mL | ORAL | Status: DC
  Administered 2023-06-19: 1000 mL
  Filled 2023-06-19 (×2): qty 1000

## 2023-06-19 MED ORDER — NALOXONE HCL 0.4 MG/ML IJ SOLN: 0.4000 mg | INTRAMUSCULAR | Status: DC | PRN

## 2023-06-19 MED ORDER — KETOROLAC TROMETHAMINE 15 MG/ML IJ SOLN
15.0000 mg | Freq: Four times a day (QID) | INTRAMUSCULAR | Status: DC | PRN
  Administered 2023-06-19: 15 mg via INTRAVENOUS
  Filled 2023-06-19: qty 1

## 2023-06-19 MED ORDER — ACETAMINOPHEN 10 MG/ML IV SOLN
1000.0000 mg | Freq: Four times a day (QID) | INTRAVENOUS | Status: AC
  Administered 2023-06-19 – 2023-06-20 (×3): 1000 mg via INTRAVENOUS
  Filled 2023-06-19 (×3): qty 100

## 2023-06-19 MED ORDER — SODIUM CHLORIDE 0.9% FLUSH: 9.0000 mL | INTRAVENOUS | Status: DC | PRN

## 2023-06-19 MED ORDER — HYDROMORPHONE 1 MG/ML IV SOLN
INTRAVENOUS | Status: DC
  Administered 2023-06-19: 30 mg via INTRAVENOUS
  Administered 2023-06-19: 3.3 mg via INTRAVENOUS
  Administered 2023-06-20: 30 mg via INTRAVENOUS
  Administered 2023-06-20: 4.2 mg via INTRAVENOUS
  Administered 2023-06-21 (×2): 2.4 mg via INTRAVENOUS
  Administered 2023-06-21: 5.4 mg via INTRAVENOUS
  Administered 2023-06-21: 3 mg via INTRAVENOUS
  Administered 2023-06-21: 3.3 mg via INTRAVENOUS
  Administered 2023-06-21: 2.4 mg via INTRAVENOUS
  Administered 2023-06-21: 5.4 mg via INTRAVENOUS
  Administered 2023-06-22: 0.9 mg via INTRAVENOUS
  Administered 2023-06-22: 30 mg via INTRAVENOUS
  Administered 2023-06-22: 1.5 mg via INTRAVENOUS
  Administered 2023-06-23: 1.8 mg via INTRAVENOUS
  Administered 2023-06-23: 0.9 mg via INTRAVENOUS
  Administered 2023-06-23: 1.2 mg via INTRAVENOUS
  Administered 2023-06-24: 2.4 mg via INTRAVENOUS
  Administered 2023-06-24: 1.8 mg via INTRAVENOUS
  Filled 2023-06-19 (×4): qty 30

## 2023-06-19 MED ORDER — MAGNESIUM SULFATE 2 GM/50ML IV SOLN
2.0000 g | Freq: Once | INTRAVENOUS | Status: AC
  Administered 2023-06-19: 2 g via INTRAVENOUS
  Filled 2023-06-19: qty 50

## 2023-06-19 MED ORDER — KETOROLAC TROMETHAMINE 15 MG/ML IJ SOLN: 15.0000 mg | Freq: Four times a day (QID) | INTRAMUSCULAR | Status: DC

## 2023-06-19 NOTE — Progress Notes (Signed)
Pt having pain 10 out of 10 every reassessment. Pain is not tolerated with receiving q3 dilaudid regularly. Page on call provider for a supplemental pain med to assist. Provider gives verbal orders to place toradol 15mg  q6 prn on MAR.. orders placed

## 2023-06-19 NOTE — Progress Notes (Signed)
Occupational Therapy Treatment Patient Details Name: Lindsay Frost MRN: [id number] DOB: October 05, 1955 Today's Date: 06/19/2023   History of present illness 67 y.o. 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 10/2 S/P  s/p Exploratory laparotomy with takedown and subsequent re-placement of jejunostomy tube (red rubber), lysis of adhesions. PMH: back pain, cholecystectomy, gastric ulcer, depression.   OT comments  Patient supine in bed and agreeable to OT.  She remains limited by pain and decreased activity tolerance.  Cueing throughout session for safety and problem solving, some decreased awareness; pt reports fatigued and not sleeping last night due to pain so anticipate this is contributing but would benefit from pill box test.  Patient required cueing for bed mobility to protect abdomen into sidelying, min guard for transfers and LB Adls.  Will follow acutely.       If plan is discharge home, recommend the following:  Assistance with cooking/housework;Assist for transportation   Equipment Recommendations  BSC/3in1    Recommendations for Other Services      Precautions / Restrictions Precautions Precautions: Fall Precaution Comments: abdominal surgery, foley, PEG, JG drain Required Braces or Orthoses: Other Brace Other Brace: abdominal binder- pt refusing Restrictions Weight Bearing Restrictions: No       Mobility Bed Mobility Overal bed mobility: Needs Assistance Bed Mobility: Rolling, Sidelying to Sit Rolling: Supervision Sidelying to sit: Supervision       General bed mobility comments: supervision to roll and protect abdomen    Transfers Overall transfer level: Needs assistance Equipment used: None Transfers: Sit to/from Stand Sit to Stand: Contact guard assist           General  transfer comment: for safety, hand placement     Balance Overall balance assessment: Mild deficits observed, not formally tested                                         ADL either performed or assessed with clinical judgement   ADL Overall ADL's : Needs assistance/impaired Eating/Feeding: NPO   Grooming: Sitting;Set up           Upper Body Dressing : Set up;Sitting   Lower Body Dressing: Contact guard assist;Sit to/from stand Lower Body Dressing Details (indicate cue type and reason): able to don socks at EOB, min guard into standing Toilet Transfer: Contact guard assist;Ambulation Toilet Transfer Details (indicate cue type and reason): hand held assist and IV pole on R hand, mild instability         Functional mobility during ADLs: Contact guard assist;Cueing for safety      Extremity/Trunk Assessment Upper Extremity Assessment Upper Extremity Assessment: Generalized weakness   Lower Extremity Assessment Lower Extremity Assessment: Defer to PT evaluation        Vision       Perception     Praxis      Cognition Arousal: Alert Behavior During Therapy: [facility] for tasks assessed/performed, Flat affect Overall Cognitive Status: Impaired/Different from baseline Area of Impairment: Safety/judgement, Awareness, Problem solving                         Safety/Judgement: Decreased awareness of safety Awareness: Emergent Problem Solving: Slow processing, Requires verbal cues, Difficulty sequencing General Comments: continue assessment with pill box test,  but overall following commands and engaging appropriately        Exercises      Shoulder Instructions       General Comments sister in law at side and supportive    Pertinent Vitals/ Pain       Pain Assessment Pain Assessment: 0-10 Pain Score: 6  Faces Pain Scale: Hurts little more Pain Location: abdomen and back Pain Descriptors / Indicators: Grimacing, Guarding Pain  Intervention(s): Limited activity within patient's tolerance, Monitored during session, Repositioned  Home Living                                          Prior Functioning/Environment              Frequency  Min 1X/week        Progress Toward Goals  OT Goals(current goals can now be found in the care plan section)  Progress towards OT goals: Progressing toward goals  Acute Rehab OT Goals Patient Stated Goal: get better OT Goal Formulation: With patient Time For Goal Achievement: 06/26/23 Potential to Achieve Goals: Good  Plan      Co-evaluation                 AM-PAC OT "6 Clicks" Daily Activity     Outcome Measure   Help from another person eating meals?: None Help from another person taking care of personal grooming?: A Little Help from another person toileting, which includes using toliet, bedpan, or urinal?: A Little Help from another person bathing (including washing, rinsing, drying)?: A Little Help from another person to put on and taking off regular upper body clothing?: A Little Help from another person to put on and taking off regular lower body clothing?: A Little 6 Click Score: 19    End of Session    OT Visit Diagnosis: Unsteadiness on feet (R26.81);Muscle weakness (generalized) (M62.81);Other symptoms and signs involving cognitive function   Activity Tolerance Patient tolerated treatment well   Patient Left in chair;with call bell/phone within reach;with chair alarm set;with family/visitor present   Nurse Communication Mobility status        Time: 4098-1191 OT Time Calculation (min): 28 min  Charges: OT General Charges $OT Visit: 1 Visit OT Treatments $Self Care/Home Management : 23-37 mins  Barry Brunner, OT Acute Rehabilitation Services Office [phone]   Chancy Milroy 06/19/2023, 12:31 PM

## 2023-06-19 NOTE — Progress Notes (Signed)
Nutrition Follow-up  DOCUMENTATION CODES:   Severe malnutrition in context of chronic illness, Underweight  INTERVENTION:  TF advancement via J-tube per Surgery: Continue Osmolite 1.2 at 40ml/h  Once able to advance further, recommend: Advancing Osmolite 1.2 by 10ml q12h to a goal rate of  40ml/h ( per day)  Goal TF regimen provides 1728 kcal, 80g protein, free water daily.   Continue liquid Vitamin D 1,000 units daily.   When enteral nutrition is tolerated at goal, will need to begin bariatric vitamin supplementation: MVI x 2 (can be crushed and given via tube) and Calcium carbonate 500 mg TID.    Continue Reglan  NUTRITION DIAGNOSIS:   Severe Malnutrition related to chronic illness (PUD, gastroparesis, esophagitis, duodenal bulb stenosis) as evidenced by severe fat depletion, severe muscle depletion. - remains applicable  GOAL:   Patient will meet greater than or equal to 90% of their needs - goal unmet  MONITOR:   TF tolerance, Labs, Weight trends  REASON FOR ASSESSMENT:   Consult Enteral/tube feeding initiation and management (resume trickle tube feeds)  ASSESSMENT:   67 yo female admitted with GOO d/t chronic PUD. S/P distal gastrectomy with roux en y gastrojejunostomy and placement of gastric and jejunal feeding tubes 9/20. PMH includes PUD, gastroparesis, duodenal diverticulum, duodenal bulb stenosis, esophagitis, GERD, hiatal hernia, gallstones, meningioma, HTN, seizures, B-12 deficiency.  9/20: s/p exlap, distal gastrectomy with roux-en-y gastrojejunostomy; GJ tube placement 9/22: trickle TF initiated 9/25: s/p abd xray-no bowel obstruction; TF advanced to 27ml/h  10/2: exlap with takedown, re-placement of J-tube (red rubber tube), lysis of adhesions  TF had been at goal rate of 21ml/h prior to surgery yesterday. TF reinitiated at low rate following procedure yesterday. At time of visit with pt, RN present. TF still infusing at 50ml/h. Per  Surgery, ok to advance to 55ml/h and hold until having BMs. Adjusted rate at that time.   Pt doing well, feeling tired. Endorses some nausea and surgical incision discomfort. No further episodes of emesis reported.   G-tube remains to gravity- 90ml output x24 hours  Medications: 1000 units liquid Vitamin D daily, IV reglan, IV protonix, carafate QID, zofran PRN  Labs: Mg 1.6, CBG's 107-180 x24 hours  Diet Order:   Diet Order             Diet NPO time specified Except for: Ice Chips, Other (See Comments)  Diet effective now                   EDUCATION NEEDS:   Education needs have been addressed  Skin:  Skin Assessment: Skin Integrity Issues: Skin Integrity Issues:: Incisions Incisions: surgical incision to abdomen  Last BM:  10/1 (type 6 medium)  Height:   Ht Readings from Last 1 Encounters:  06/18/23 5\' 4"  (1.626 m)    Weight:   Wt Readings from Last 1 Encounters:  06/19/23 50.2 kg    Ideal Body Weight:  54.5 kg  BMI:  Body mass index is 19 kg/m.  Estimated Nutritional Needs:   Kcal:  1650-1850  Protein:  70-90 gm  Fluid:  1.7-1.9 L  Drusilla Kanner, RDN, LDN Clinical Nutrition

## 2023-06-19 NOTE — Progress Notes (Addendum)
1 Day Post-Op  Subjective: CC: Having a lot of pain at her incision post op that she does not feel is well controlled with prn meds.  Tolerating TF's at 76ml/hr without n/v. No flatus or bm since surgery yet.  Foley in place with good uop.  Has not been oob. About to work with OT.   Objective: Vital signs in last 24 hours: Temp:  [97 F (36.1 C)-98.3 F (36.8 C)] 98.2 F (36.8 C) (10/03 0801) Pulse Rate:  [88-129] 97 (10/03 0801) Resp:  [16-22] 17 (10/03 0801) BP: (104-149)/(41-104) 123/74 (10/03 0801) SpO2:  [94 %-97 %] 97 % (10/03 0801) Weight:  [46.7 kg-50.2 kg] 50.2 kg (10/03 0454) Last BM Date : 06/17/23  Intake/Output from previous day: 10/02 0701 - 10/03 0700 In: 2410.9 [I.V.:1756.6; IV Piggyback:474.3] Out: 1310 [Urine:1200; Drains:90; Blood:20] Intake/Output this shift: No intake/output data recorded.  PE: Gen:  Alert, NAD Card: Reg Lungs: Normal rate and effort.  Abd: Soft, mild distension. TTP around her midline incision that appears appropriate. Also with some ttp around her g-tube. Otherwise NT. No rigidity or guarding. G-tube site with some bilious drainage on split gauze. Stitch in place securing G-tube. There is some localized irritation, erythema and induration around the G-tube site. G-tube is currently to gravity with bilious drainage in gravity bag - 90cc/24 hours.Midline cdi with honeycomb dressing in place with staples below - cdi. J tube with tf's at 20cc/hr.  Msk: No LE edema  Lab Results:  Recent Labs    06/17/23 0241 06/18/23 0738  WBC 15.2* 11.9*  HGB 9.9* 10.4*  HCT 30.6* 32.4*  PLT 640* 729*   BMET Recent Labs    06/17/23 0241 06/18/23 0738  NA 134* 136  K 3.8 4.1  CL 103 99  CO2 25 26  GLUCOSE 111* 111*  BUN 8 6*  CREATININE 0.48 0.47  CALCIUM 7.7* 8.2*   PT/INR No results for input(s): "LABPROT", "INR" in the last 72 hours. CMP     Component Value Date/Time   NA 136 06/18/2023 0738   NA 144 04/13/2021 1544   K 4.1  06/18/2023 0738   CL 99 06/18/2023 0738   CO2 26 06/18/2023 0738   GLUCOSE 111 (H) 06/18/2023 0738   BUN 6 (L) 06/18/2023 0738   BUN 15 04/13/2021 1544   CREATININE 0.47 06/18/2023 0738   CREATININE 1.08 (H) 02/16/2021 1506   CALCIUM 8.2 (L) 06/18/2023 0738   PROT 6.6 05/30/2023 1200   ALBUMIN 3.4 (L) 05/30/2023 1200   AST 18 05/30/2023 1200   ALT 11 05/30/2023 1200   ALKPHOS 47 05/30/2023 1200   BILITOT <0.1 (L) 05/30/2023 1200   GFRNONAA >60 06/18/2023 0738   GFRNONAA >89 01/17/2016 1424   GFRAA 67 07/25/2020 1427   GFRAA >89 01/17/2016 1424   Lipase     Component Value Date/Time   LIPASE 24 02/16/2021 1506    Studies/Results: PERIPHERAL VASCULAR CATHETERIZATION  Result Date: 06/18/2023 See surgical note for result.  CT ABDOMEN PELVIS W CONTRAST  Result Date: 06/17/2023 CLINICAL DATA:  Abdominal pain EXAM: CT ABDOMEN AND PELVIS WITH CONTRAST TECHNIQUE: Multidetector CT imaging of the abdomen and pelvis was performed using the standard protocol following bolus administration of intravenous contrast. RADIATION DOSE REDUCTION: This exam was performed according to the departmental dose-optimization program which includes automated exposure control, adjustment of the mA and/or kV according to patient size and/or use of iterative reconstruction technique. CONTRAST:  75mL OMNIPAQUE IOHEXOL 350 MG/ML SOLN COMPARISON:  03/18/2023 FINDINGS: Lower chest: No pleural or pericardial effusion. Hepatobiliary: Cholecystectomy clips. Stable central and extrahepatic biliary ectasia. No focal liver lesion. Pancreas: Unremarkable. No pancreatic ductal dilatation or surrounding inflammatory changes. Spleen: Normal in size without focal abnormality. Adrenals/Urinary Tract: No adrenal mass. Symmetric renal parenchymal enhancement without hydronephrosis. Multiple parapelvic cysts left greater than right as before. Urinary bladder partially distended. Stomach/Bowel: Interval distal gastrectomy. Malecot  gastrostomy catheter within the gastric remnant. There is dilatation of the proximal duodenal remnant, and loops of proximal jejunum from the ligament of Treitz to the anastomotic staple line. Balloon retention jejunostomy catheter into a decompressed loop of small bowel. There is whirling of mesentery, vessels, and the loop of bowel containing feeding tube around a mesenteric vascular pedicle in the right lower quadrant suggesting mesenteric volvulus, without convincing evidence of vascular compromise. The distal small bowel is decompressed. The colon is partially distended, without acute finding. Vascular/Lymphatic: No AAA. Portal vein patent. No abdominal or pelvic adenopathy. Reproductive: Uterus and bilateral adnexa are unremarkable. Other: No ascites. No free air. There is some subcutaneous gas around the gastrostomy catheter. Midline skin staples. Musculoskeletal: Grade 1 anterolisthesis L4-5 without pars defect, likely related to facet DJD. Degenerative disc disease L5-S1. Sacral Tarlov cyst, an anatomic variant. IMPRESSION: 1. Interval distal gastrectomy with gastrostomy and jejunostomy catheters. 2. Dilated proximal small bowel with whirling of mesentery and vessels in the right lower quadrant suggesting mesenteric volvulus. No evidence of vascular compromise. Electronically Signed   By: Corlis Leak M.D.   On: 06/17/2023 15:29    Anti-infectives: Anti-infectives (From admission, onward)    Start     Dose/Rate Route Frequency Ordered Stop   06/18/23 1115  cefoTEtan (CEFOTAN) 2 g in sodium chloride 0.9 % 100 mL IVPB        2 g 200 mL/hr over 30 Minutes Intravenous  Once 06/18/23 1107 06/18/23 1124   06/18/23 1103  sodium chloride 0.9 % with cefoTEtan (CEFOTAN) ADS Med       Note to Pharmacy: Payton Emerald A: cabinet override      06/18/23 1103 06/18/23 1133   06/06/23 0600  cefoTEtan (CEFOTAN) 2 g in sodium chloride 0.9 % 100 mL IVPB        2 g 200 mL/hr over 30 Minutes Intravenous On call to  O.R. 06/06/23 0555 06/06/23 0818        Assessment/Plan Gastric outlet obstruction due to chronic peptic ulcer disease   POD#12 s/p Exploratory laparotomy, Distal gastrectomy with Roux-en-Y gastrojejunostomy, Placement of 24-French gastric tube, Placement of 18-French jejunostomy tube 9/20 Dr. Andrey Campanile POD # 1 s/p Exploratory laparotomy with takedown and subsequent re-placement of jejunostomy tube (red rubber), lysis of adhesions by Dr. Cliffton Asters for sbo at J-J site  - Surgical path negative for H pylori and negative for malignancy - Cont PPI + Carafate  - Will add PCA today. Cont IV Tylenol and Robaxin. Avoid NSAIDs - Cont scheduled Reglan and PRN anti-emetics.  - Okay for TF's at 30cc/hr today. Would await return of bowel function before further advancement. No whole or crushed meds via J-tube to avoid clogging - Cont G-tube to gravity. Cont abdominal binder to avoid tension/pulling on G-tube. Cont destin around G-tube site - Mobilize, PT - no fu recommended  - D/c Foley   ID - Cefotetan periop x 2. None currently.  FEN - IVF at 47ml/hr till TF's at goal. G-tube to gravity, NPO, TF's to 30cc/hr  VTE - SCDs, Lovenox Foley - out and voiding  HTN - PRN meds while NPO Hx Seizures - Home meds   LOS: 13 days    Jacinto Halim , [facility] Surgery 06/19/2023, 9:43 AM Please see Amion for pager number during day hours 7:00am-4:30pm

## 2023-06-20 LAB — GLUCOSE, CAPILLARY
Glucose-Capillary: 104 mg/dL — ABNORMAL HIGH (ref 70–99)
Glucose-Capillary: 111 mg/dL — ABNORMAL HIGH (ref 70–99)
Glucose-Capillary: 112 mg/dL — ABNORMAL HIGH (ref 70–99)
Glucose-Capillary: 113 mg/dL — ABNORMAL HIGH (ref 70–99)
Glucose-Capillary: 116 mg/dL — ABNORMAL HIGH (ref 70–99)
Glucose-Capillary: 120 mg/dL — ABNORMAL HIGH (ref 70–99)
Glucose-Capillary: 123 mg/dL — ABNORMAL HIGH (ref 70–99)
Glucose-Capillary: 133 mg/dL — ABNORMAL HIGH (ref 70–99)

## 2023-06-20 LAB — PHOSPHORUS: Phosphorus: 3.1 mg/dL (ref 2.5–4.6)

## 2023-06-20 LAB — MAGNESIUM: Magnesium: 2 mg/dL (ref 1.7–2.4)

## 2023-06-20 MED ORDER — ACETAMINOPHEN 10 MG/ML IV SOLN
1000.0000 mg | Freq: Four times a day (QID) | INTRAVENOUS | Status: AC
  Administered 2023-06-20 – 2023-06-21 (×4): 1000 mg via INTRAVENOUS
  Filled 2023-06-20 (×4): qty 100

## 2023-06-20 MED ORDER — KCL-LACTATED RINGERS-D5W 20 MEQ/L IV SOLN: INTRAVENOUS | Status: AC

## 2023-06-20 MED ORDER — VITAMIN D 25 MCG (1000 UNIT) PO TABS
1000.0000 [IU] | ORAL_TABLET | Freq: Every day | ORAL | Status: DC
  Administered 2023-06-20 – 2023-06-24 (×4): 1000 [IU]
  Filled 2023-06-20 (×5): qty 1

## 2023-06-20 MED ORDER — OSMOLITE 1.2 CAL PO LIQD
1000.0000 mL | ORAL | Status: AC
  Administered 2023-06-21 – 2023-06-23 (×4): 1000 mL
  Filled 2023-06-20 (×4): qty 1000
  Filled 2023-06-20: qty 1185
  Filled 2023-06-20 (×3): qty 1000

## 2023-06-20 NOTE — Progress Notes (Signed)
Physical Therapy Treatment Patient Details Name: Lindsay Frost MRN: [id number] DOB: 24-Mar-1956 Today's Date: 06/20/2023   History of Present Illness 67 y.o. 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 10/2 S/P  s/p Exploratory laparotomy with takedown and subsequent re-placement of jejunostomy tube (red rubber), lysis of adhesions. PMH: back pain, cholecystectomy, gastric ulcer, depression.    PT Comments  Pt is slowly progressing towards goals. Continues to be limited by fatigue and pain. Pt is CGA for sit to stand and gait this session; improved log roll for guarding abdomen with bed mobility. Pt currently is using IV pole for stability with gait. Due to pt current functional status, home set up and available assistance at home no recommended skilled physical therapy services on discharge from acute care hospital setting. Pt was encouraged to mobilize as often as able with staff with education on the importance of mobility for bowel mobility, circulation, respiratory system. Will continue to follow in the acute care hospital setting in order to ensure pt returns home with decreased risk for falls, injury and re-hospitalization.     If plan is discharge home, recommend the following: A little help with walking and/or transfers;A little help with bathing/dressing/bathroom;Assistance with cooking/housework;Assist for transportation     Equipment Recommendations  BSC/3in1       Precautions / Restrictions Precautions Precautions: Fall Precaution Comments: abdominal surgery, foley, PEG, JG drain Restrictions Weight Bearing Restrictions: No     Mobility  Bed Mobility Overal bed mobility: Needs Assistance Bed Mobility: Rolling, Sidelying to Sit Rolling: Supervision Sidelying to sit: Supervision       General bed  mobility comments: supervision to roll and protect abdomen    Transfers Overall transfer level: Needs assistance Equipment used: None Transfers: Sit to/from Stand Sit to Stand: Contact guard assist           General transfer comment: for safety, hand placement    Ambulation/Gait Ambulation/Gait assistance: Contact guard assist Gait Distance (Feet): 250 Feet Assistive device: IV Pole Gait Pattern/deviations: Step-through pattern, Decreased step length - right, Decreased step length - left, Narrow base of support Gait velocity: decreased Gait velocity interpretation: <1.31 ft/sec, indicative of household ambulator   General Gait Details: no LOB today     Balance Overall balance assessment: Mild deficits observed, not formally tested          Cognition Arousal: Alert Behavior During Therapy: WFL for tasks assessed/performed, Flat affect Overall Cognitive Status: Within Functional Limits for tasks assessed           General Comments General comments (skin integrity, edema, etc.): no significant fatigue.      Pertinent Vitals/Pain Pain Assessment Pain Assessment: Faces Faces Pain Scale: Hurts even more Pain Location: abdomen and back Pain Descriptors / Indicators: Grimacing, Guarding Pain Intervention(s): Limited activity within patient's tolerance, Monitored during session     PT Goals (current goals can now be found in the care plan section) Acute Rehab PT Goals PT Goal Formulation: With patient Time For Goal Achievement: 06/26/23 Potential to Achieve Goals: Fair Progress towards PT goals: Progressing toward goals    Frequency    Min 1X/week      PT Plan  Continue with current POC       AM-PAC PT "6 Clicks" Mobility   Outcome Measure  Help needed turning from your back to your side while in a flat  bed without using bedrails?: None Help needed moving from lying on your back to sitting on the side of a flat bed without using bedrails?:  None Help needed moving to and from a bed to a chair (including a wheelchair)?: A Little Help needed standing up from a chair using your arms (e.g., wheelchair or bedside chair)?: A Little Help needed to walk in hospital room?: A Little Help needed climbing 3-5 steps with a railing? : A Little 6 Click Score: 20    End of Session Equipment Utilized During Treatment:  (no gait belt due to incision site) Activity Tolerance: Patient tolerated treatment well Patient left: in chair;with call bell/phone within reach Nurse Communication: Mobility status PT Visit Diagnosis: Unsteadiness on feet (R26.81);Other abnormalities of gait and mobility (R26.89);Muscle weakness (generalized) (M62.81);Pain Pain - part of body:  (abdomen)     Time: 1610-9604 PT Time Calculation (min) (ACUTE ONLY): 19 min  Charges:    $Therapeutic Activity: 8-22 mins PT General Charges $$ ACUTE PT VISIT: 1 Visit                     Harrel Carina, DPT, CLT  Acute Rehabilitation Services Office: [phone] (Secure chat preferred)    Claudia Desanctis 06/20/2023, 1:26 PM

## 2023-06-20 NOTE — Progress Notes (Signed)
   06/20/23 1354  Mobility  Activity Ambulated with assistance in room  Level of Assistance Contact guard assist, steadying assist  Assistive Device Other (Comment) (IV Pole)  Distance Ambulated (ft) 15 ft  Activity Response Tolerated fair  Mobility Referral Yes  $Mobility charge 1 Mobility  Mobility Specialist Start Time (ACUTE ONLY) 1317  Mobility Specialist Stop Time (ACUTE ONLY) 0137  Mobility Specialist Time Calculation (min) (ACUTE ONLY) 740 min   Mobility Specialist: Progress Note  Pt agreeable to mobility session - received in chair. Required CG using IV Pole with c/o lower back pain rated 10/10. Returned to bed with all needs met - call bell within reach.   Barnie Mort, BS Mobility Specialist Please contact via SecureChat or Rehab office at [phone].

## 2023-06-20 NOTE — Progress Notes (Signed)
2 Days Post-Op  Subjective: CC: Pain is much better controlled with pca. Only having pain at her incision today. Some nausea yesterday. None today. Denies vomiting. Tolerating TF's at 62ml/hr without n/v. Passing flatus. No BM since surgery. Foley out. Voiding. Oob to chair yesterday. G-tube with no output recorded over the last 24 hours - 100cc bilious output in gravity bag. She reports less leakage around G-tube and dressing had to be changed ~2 times.   Objective: Vital signs in last 24 hours: Temp:  [97.6 F (36.4 C)-98.3 F (36.8 C)] 98.1 F (36.7 C) (10/04 0728) Pulse Rate:  [86-110] 88 (10/04 0728) Resp:  [15-22] 16 (10/04 0732) BP: (100-119)/(59-73) 108/73 (10/04 0728) SpO2:  [94 %-98 %] 97 % (10/04 0732) FiO2 (%):  [21 %] 21 % (10/03 1357) Weight:  [52 kg] 52 kg (10/04 0500) Last BM Date : 06/17/23  Intake/Output from previous day: 10/03 0701 - 10/04 0700 In: 4497.5 [P.O.:50; I.V.:1959; NG/GT:1789.3; IV Piggyback:609.2] Out: 200 [Urine:200] Intake/Output this shift: Total I/O In: 160 [Other:60; IV Piggyback:100] Out: -   PE: Gen:  Alert, NAD Card: Reg Lungs: Normal rate and effort.  Abd: Soft, mild distension. TTP around her midline incision that appears appropriate. Also with some ttp around her g-tube. Otherwise NT. No rigidity or guarding. G-tube site with some bilious drainage on split gauze. Stitch in place securing G-tube. There is some localized irritation, erythema and induration around the G-tube site. G-tube is currently to gravity with bilious drainage in gravity bag - 100cc.Midline cdi with staples in place. J tube with tf's at 30cc/hr.  Msk: No LE edema  Lab Results:  Recent Labs    06/18/23 0738  WBC 11.9*  HGB 10.4*  HCT 32.4*  PLT 729*   BMET Recent Labs    06/18/23 0738  NA 136  K 4.1  CL 99  CO2 26  GLUCOSE 111*  BUN 6*  CREATININE 0.47  CALCIUM 8.2*   PT/INR No results for input(s): "LABPROT", "INR" in the last 72  hours. CMP     Component Value Date/Time   NA 136 06/18/2023 0738   NA 144 04/13/2021 1544   K 4.1 06/18/2023 0738   CL 99 06/18/2023 0738   CO2 26 06/18/2023 0738   GLUCOSE 111 (H) 06/18/2023 0738   BUN 6 (L) 06/18/2023 0738   BUN 15 04/13/2021 1544   CREATININE 0.47 06/18/2023 0738   CREATININE 1.08 (H) 02/16/2021 1506   CALCIUM 8.2 (L) 06/18/2023 0738   PROT 6.6 05/30/2023 1200   ALBUMIN 3.4 (L) 05/30/2023 1200   AST 18 05/30/2023 1200   ALT 11 05/30/2023 1200   ALKPHOS 47 05/30/2023 1200   BILITOT <0.1 (L) 05/30/2023 1200   GFRNONAA >60 06/18/2023 0738   GFRNONAA >89 01/17/2016 1424   GFRAA 67 07/25/2020 1427   GFRAA >89 01/17/2016 1424   Lipase     Component Value Date/Time   LIPASE 24 02/16/2021 1506    Studies/Results: PERIPHERAL VASCULAR CATHETERIZATION  Result Date: 06/18/2023 See surgical note for result.   Anti-infectives: Anti-infectives (From admission, onward)    Start     Dose/Rate Route Frequency Ordered Stop   06/18/23 1115  cefoTEtan (CEFOTAN) 2 g in sodium chloride 0.9 % 100 mL IVPB        2 g 200 mL/hr over 30 Minutes Intravenous  Once 06/18/23 1107 06/18/23 1124   06/18/23 1103  sodium chloride 0.9 % with cefoTEtan (CEFOTAN) ADS Med  Note to Pharmacy: Payton Emerald A: cabinet override      06/18/23 1103 06/18/23 1133   06/06/23 0600  cefoTEtan (CEFOTAN) 2 g in sodium chloride 0.9 % 100 mL IVPB        2 g 200 mL/hr over 30 Minutes Intravenous On call to O.R. 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 Andrey Campanile POD # 2 s/p Exploratory laparotomy with takedown and subsequent re-placement of jejunostomy tube (red rubber), lysis of adhesions by Dr. Cliffton Asters for sbo at J-J site  - Surgical path  negative for H pylori and negative for malignancy - Cont PPI + Carafate  - Cont PCA today. Consider switching back to PRN tomorrow. Cont IV Tylenol and Robaxin. Avoid NSAIDs - Cont scheduled Reglan and PRN anti-emetics.  - Adv TF's to goal. No whole or crushed meds via J-tube to avoid clogging - Cont G-tube to gravity. Cont abdominal binder to avoid tension/pulling on G-tube. Cont destin around G-tube site. Output slowing from G-tube. If leakage around G-tube continues to improves/stops and output from G-tube remains low could consider clamping G-tube in future.  - Mobilize, PT - no fu recommended    ID - Cefotetan periop x 2. None currently.  FEN - Wean IVF off as increasing TF's to goal. G-tube to gravity, NPO VTE - SCDs, Lovenox Foley - out and voiding   HTN - PRN meds while NPO Hx Seizures - Home meds   LOS: 14 days    Jacinto Halim , [facility] 06/20/2023, 10:03 AM Please see Amion for pager number during day hours 7:00am-4:30pm

## 2023-06-20 NOTE — Plan of Care (Signed)

## 2023-06-20 NOTE — Plan of Care (Signed)
  Problem: Education: Goal: Knowledge of General Education information will improve Description: Including pain rating scale, medication(s)/side effects and non-pharmacologic comfort measures Outcome: Progressing   Problem: Health Behavior/Discharge Planning: Goal: Ability to manage health-related needs will improve Outcome: Progressing   Problem: Clinical Measurements: Goal: Will remain free from infection Outcome: Progressing   Problem: Activity: Goal: Risk for activity intolerance will decrease Outcome: Progressing   Problem: Nutrition: Goal: Adequate nutrition will be maintained Outcome: Progressing   Problem: Pain Managment: Goal: General experience of comfort will improve Outcome: Progressing   Problem: Safety: Goal: Ability to remain free from injury will improve Outcome: Progressing   Problem: Skin Integrity: Goal: Risk for impaired skin integrity will decrease Outcome: Progressing   

## 2023-06-21 LAB — BASIC METABOLIC PANEL
Anion gap: 10 (ref 5–15)
BUN: 9 mg/dL (ref 8–23)
CO2: 21 mmol/L — ABNORMAL LOW (ref 22–32)
Calcium: 7.7 mg/dL — ABNORMAL LOW (ref 8.9–10.3)
Chloride: 100 mmol/L (ref 98–111)
Creatinine, Ser: 0.51 mg/dL (ref 0.44–1.00)
GFR, Estimated: >60 mL/min (ref >60)
Glucose, Bld: 127 mg/dL — ABNORMAL HIGH (ref 70–99)
Potassium: 4.1 mmol/L (ref 3.5–5.1)
Sodium: 131 mmol/L — ABNORMAL LOW (ref 135–145)

## 2023-06-21 LAB — GLUCOSE, CAPILLARY
Glucose-Capillary: 116 mg/dL — ABNORMAL HIGH (ref 70–99)
Glucose-Capillary: 127 mg/dL — ABNORMAL HIGH (ref 70–99)
Glucose-Capillary: 127 mg/dL — ABNORMAL HIGH (ref 70–99)
Glucose-Capillary: 103 mg/dL — ABNORMAL HIGH (ref 70–99)
Glucose-Capillary: 119 mg/dL — ABNORMAL HIGH (ref 70–99)

## 2023-06-21 LAB — CBC
HCT: 28.3 % — ABNORMAL LOW (ref 36.0–46.0)
Hemoglobin: 9 g/dL — ABNORMAL LOW (ref 12.0–15.0)
MCH: 30.1 pg (ref 26.0–34.0)
MCHC: 31.8 g/dL (ref 30.0–36.0)
MCV: 94.6 fL (ref 80.0–100.0)
Platelets: 621 10*3/uL — ABNORMAL HIGH (ref 150–400)
RBC: 2.99 MIL/uL — ABNORMAL LOW (ref 3.87–5.11)
RDW: 13.1 % (ref 11.5–15.5)
WBC: 15.5 10*3/uL — ABNORMAL HIGH (ref 4.0–10.5)
nRBC: 0 % (ref 0.0–0.2)

## 2023-06-21 LAB — URINALYSIS, ROUTINE W REFLEX MICROSCOPIC
Bilirubin Urine: NEGATIVE
Glucose, UA: NEGATIVE mg/dL
Hgb urine dipstick: NEGATIVE
Ketones, ur: NEGATIVE mg/dL
Leukocytes,Ua: NEGATIVE
Nitrite: NEGATIVE
Protein, ur: NEGATIVE mg/dL
Specific Gravity, Urine: 1.018 (ref 1.005–1.030)
pH: 5 (ref 5.0–8.0)

## 2023-06-21 MED ORDER — METHOCARBAMOL 1000 MG/10ML IJ SOLN: 500.0000 mg | Freq: Four times a day (QID) | INTRAVENOUS | Status: DC | PRN

## 2023-06-21 NOTE — Progress Notes (Signed)
Patient complaining of frequently feeling pressure in her bladder.  She said she feels that she has to strain to get the urine out.  She said this has been a chronic problem but that it seems worse.

## 2023-06-21 NOTE — Plan of Care (Signed)

## 2023-06-21 NOTE — Progress Notes (Signed)
While assessing and giving medication to the pt, this RN noticed that her J tube has dislodged from its original position. Pt stated she didn't know what happened. Tube feeding stopped and med not given. On call MD notified. Dr. Bedelia Person is in the OR at the moment and will come assess the pt when he is available. Will continue to monitor.

## 2023-06-21 NOTE — Progress Notes (Signed)
Mobility Specialist: Progress Note   06/21/23 1232  Mobility  Activity Ambulated with assistance in hallway  Level of Assistance Contact guard assist, steadying assist  Assistive Device Other (Comment) (IV pole + HHA)  Distance Ambulated (ft) 35 ft  Activity Response Tolerated well  Mobility Referral Yes  $Mobility charge 1 Mobility  Mobility Specialist Start Time (ACUTE ONLY) 1208  Mobility Specialist Stop Time (ACUTE ONLY) 1228  Mobility Specialist Time Calculation (min) (ACUTE ONLY) 20 min    Pt was agreeable to mobility session - received in bed.SV for bed mobility, CG for STS and ambulation. Pt was much weaker today and had c/o stomach pain rated 10/10. Returned to room without fault. Left in bed with all needs met, call bell in reach. RN in room.   Maurene Capes Mobility Specialist Please contact via SecureChat or Rehab office at [phone]

## 2023-06-21 NOTE — Progress Notes (Signed)
3 Days Post-Op   Subjective/Chief Complaint: Patient appears comfortable.  She is quite weak.  Family stated she had pain last night that was not well-controlled.  She appears much better this morning though.   Objective: Vital signs in last 24 hours: Temp:  [97.8 F (36.6 C)-98.5 F (36.9 C)] 98.2 F (36.8 C) (10/05 0722) Pulse Rate:  [82-94] 82 (10/05 0722) Resp:  [15-19] 19 (10/05 0927) BP: (114-124)/(67-75) 114/67 (10/05 0722) SpO2:  [92 %-98 %] 96 % (10/05 0722) FiO2 (%):  [21 %] 21 % (10/05 0927) Weight:  [53.2 kg] 53.2 kg (10/05 0438) Last BM Date : 06/17/23 (per pt)  Intake/Output from previous day: 10/04 0701 - 10/05 0700 In: 3016 [I.V.:860.8; NG/GT:903.3; IV Piggyback:951.8] Out: 475 [Drains:475] Intake/Output this shift: No intake/output data recorded.   Card: Reg Lungs: Normal rate and effort.  Abd: Soft, mild distension. TTP around her midline incision that appears appropriate. Also with some ttp around her g-tube. Otherwise NT. No rigidity or guarding. G-tube site with some bilious drainage on split gauze. Stitch in place securing G-tube. There is some localized irritation, erythema and induration around the G-tube site. G-tube is currently to gravity with bilious drainage in gravity bag   .Midline cdi with staples in place. J tube with tf's at 60cc/hr.  Msk: No LE edema Lab Results:  Recent Labs    06/21/23 0757  WBC 15.5*  HGB 9.0*  HCT 28.3*  PLT 621*   BMET Recent Labs    06/21/23 0757  NA 131*  K 4.1  CL 100  CO2 21*  GLUCOSE 127*  BUN 9  CREATININE 0.51  CALCIUM 7.7*   PT/INR No results for input(s): "LABPROT", "INR" in the last 72 hours. ABG No results for input(s): "PHART", "HCO3" in the last 72 hours.  Invalid input(s): "PCO2", "PO2"  Studies/Results: No results found.  Anti-infectives: Anti-infectives (From admission, onward)    Start     Dose/Rate Route Frequency Ordered Stop   06/18/23 1115  cefoTEtan (CEFOTAN) 2 g in  sodium chloride 0.9 % 100 mL IVPB        2 g 200 mL/hr over 30 Minutes Intravenous  Once 06/18/23 1107 06/18/23 1124   06/18/23 1103  sodium chloride 0.9 % with cefoTEtan (CEFOTAN) ADS Med       Note to Pharmacy: Payton Emerald A: cabinet override      06/18/23 1103 06/18/23 1133   06/06/23 0600  cefoTEtan (CEFOTAN) 2 g in sodium chloride 0.9 % 100 mL IVPB        2 g 200 mL/hr over 30 Minutes Intravenous On call to O.R. 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 Andrey Campanile POD # 2 s/p Exploratory laparotomy with takedown and subsequent re-placement of jejunostomy tube (red rubber), lysis of adhesions by Dr. Cliffton Asters for sbo at J-J site  - Surgical path negative for H pylori and negative for malignancy - Cont PPI + Carafate  - Cont PCA today. Consider switching back to PRN tomorrow. Cont IV Tylenol and Robaxin. Avoid NSAIDs - Cont scheduled Reglan and PRN anti-emetics.  - Adv TF's to goal. No whole or crushed meds via J-tube to avoid clogging - Cont G-tube to gravity. Cont abdominal binder to  avoid tension/pulling on G-tube. Cont destin around G-tube site. Output slowing from G-tube. If leakage around G-tube continues to improves/stops and output from G-tube remains low could consider clamping G-tube in future.  - Mobilize, PT - no fu recommended    ID - Cefotetan periop x 2. None currently.  FEN - TF's at  goal. G-tube to gravity, NPO VTE - SCDs, Lovenox Foley - out and voiding   HTN - PRN meds while NPO Hx Seizures - Home meds  Complaining of burning when she urinates-send UA  Add Robaxin back to her regimen to see if this helps improve pain control.  She  appears quite comfortable now.  LOS: 15 days    Dortha Schwalbe  MD  06/21/2023

## 2023-06-21 NOTE — Plan of Care (Signed)
  Problem: Health Behavior/Discharge Planning: Goal: Ability to manage health-related needs will improve Outcome: Progressing   Problem: Clinical Measurements: Goal: Will remain free from infection Outcome: Progressing Goal: Respiratory complications will improve Outcome: Progressing Goal: Cardiovascular complication will be avoided Outcome: Progressing   Problem: Activity: Goal: Risk for activity intolerance will decrease Outcome: Progressing   Problem: Nutrition: Goal: Adequate nutrition will be maintained Outcome: Progressing   Problem: Coping: Goal: Level of anxiety will decrease Outcome: Progressing

## 2023-06-22 ENCOUNTER — Other Ambulatory Visit: Payer: Self-pay

## 2023-06-22 LAB — GLUCOSE, CAPILLARY
Glucose-Capillary: 96 mg/dL (ref 70–99)
Glucose-Capillary: 98 mg/dL (ref 70–99)
Glucose-Capillary: 76 mg/dL (ref 70–99)
Glucose-Capillary: 75 mg/dL (ref 70–99)
Glucose-Capillary: 114 mg/dL — ABNORMAL HIGH (ref 70–99)
Glucose-Capillary: 117 mg/dL — ABNORMAL HIGH (ref 70–99)

## 2023-06-22 MED ORDER — SODIUM CHLORIDE 0.9% FLUSH
10.0000 mL | INTRAVENOUS | Status: DC | PRN
  Administered 2023-06-24 – 2023-07-05 (×2): 10 mL

## 2023-06-22 MED ORDER — LIDOCAINE HCL 1 % IJ SOLN
5.0000 mL | Freq: Once | INTRAMUSCULAR | Status: AC
  Administered 2023-06-22: 5 mL via INTRADERMAL
  Filled 2023-06-22 (×3): qty 5

## 2023-06-22 MED ORDER — SODIUM BICARBONATE 650 MG PO TABS
650.0000 mg | ORAL_TABLET | Freq: Once | ORAL | Status: AC
  Administered 2023-06-22: 650 mg
  Filled 2023-06-22: qty 1

## 2023-06-22 MED ORDER — PANCRELIPASE (LIP-PROT-AMYL) 10440-39150 UNITS PO TABS
20880.0000 [IU] | ORAL_TABLET | Freq: Once | ORAL | Status: AC
  Administered 2023-06-22: 20880 [IU]
  Filled 2023-06-22 (×2): qty 2

## 2023-06-22 MED ORDER — LIDOCAINE HCL (CARDIAC) PF 100 MG/5ML IV SOSY
PREFILLED_SYRINGE | INTRAVENOUS | Status: AC
  Filled 2023-06-22: qty 5

## 2023-06-22 NOTE — Plan of Care (Signed)
  Problem: Activity: Goal: Risk for activity intolerance will decrease Outcome: Progressing   Problem: Nutrition: Goal: Adequate nutrition will be maintained Outcome: Progressing   Problem: Coping: Goal: Level of anxiety will decrease Outcome: Not Progressing   Problem: Elimination: Goal: Will not experience complications related to urinary retention Outcome: Progressing   Problem: Skin Integrity: Goal: Risk for impaired skin integrity will decrease Outcome: Progressing

## 2023-06-22 NOTE — Progress Notes (Signed)
There was consult for 2nd PIV access. Patient is 16 days of hospitalization with lost 8 PIV access. Patient needs two PIV access due to not comparable with Dilaudid and keppra & robaxin. Consider a PICC line this time. Patient's RN will ask surgeon regarding this. HS [employer]

## 2023-06-22 NOTE — Progress Notes (Signed)
Pt meds are on hold due to clog j-tube, Dr. Cliffton Asters is aware. MD is coming to the floor to assess the tube. Abdominal binder place, pt express that it is uncomfortable, education given. Will continue with plan of care.

## 2023-06-22 NOTE — Progress Notes (Signed)
Mobility Specialist: Progress Note   06/22/23 1235  Mobility  Activity Ambulated with assistance in hallway  Level of Assistance Contact guard assist, steadying assist  Assistive Device Other (Comment) (IV pole + HHA)  Distance Ambulated (ft) 100 ft  Activity Response Tolerated well  Mobility Referral Yes  $Mobility charge 1 Mobility  Mobility Specialist Start Time (ACUTE ONLY) 1156  Mobility Specialist Stop Time (ACUTE ONLY) 1211  Mobility Specialist Time Calculation (min) (ACUTE ONLY) 15 min    Pt was agreeable to mobility session - received in bed. Had complaints of stomach pain rated 8/10 and discomfort from abdominal binder. Returned to room without fault. Left in chair with all needs met, call bell in reach.   Maurene Capes Mobility Specialist Please contact via SecureChat or Rehab office at [phone]

## 2023-06-22 NOTE — Progress Notes (Signed)
4 Days Post-Op   Subjective/Chief Complaint: J tube stitch broke  Tube still in place  Pain about the same    Objective: Vital signs in last 24 hours: Temp:  [97.6 F (36.4 C)-98.4 F (36.9 C)] 98 F (36.7 C) (10/06 0738) Pulse Rate:  [93-97] 93 (10/06 0738) Resp:  [16-22] 20 (10/06 0813) BP: (111-125)/(64-80) 111/73 (10/06 0738) SpO2:  [95 %-98 %] 95 % (10/06 0813) Last BM Date : 06/21/23  Intake/Output from previous day: 10/05 0701 - 10/06 0700 In: 1646.2 [P.O.:100; NG/GT:854; IV Piggyback:542.2] Out: 675 [Urine:575; Drains:100] Intake/Output this shift: No intake/output data recorded.   Card: Reg Lungs: Normal rate and effort.  Abd: Soft, mild distension. TTP around her midline incision that appears appropriate. Also with some ttp around her g-tube. Otherwise NT. No rigidity or guarding. G-tube site with some bilious drainage on split gauze. Stitch in place securing G-tube. There is some localized irritation, erythema and induration around the G-tube site. G-tube is currently to gravity with bilious drainage in gravity bag   .Midline cdi with staples in place. J tube  sutured back into place under sterile conditions with 3 0 nylon and 1 % lidocaine local  Lab Results:  Recent Labs    06/21/23 0757  WBC 15.5*  HGB 9.0*  HCT 28.3*  PLT 621*   BMET Recent Labs    06/21/23 0757  NA 131*  K 4.1  CL 100  CO2 21*  GLUCOSE 127*  BUN 9  CREATININE 0.51  CALCIUM 7.7*   PT/INR No results for input(s): "LABPROT", "INR" in the last 72 hours. ABG No results for input(s): "PHART", "HCO3" in the last 72 hours.  Invalid input(s): "PCO2", "PO2"  Studies/Results: No results found.  Anti-infectives: Anti-infectives (From admission, onward)    Start     Dose/Rate Route Frequency Ordered Stop   06/18/23 1115  cefoTEtan (CEFOTAN) 2 g in sodium chloride 0.9 % 100 mL IVPB        2 g 200 mL/hr over 30 Minutes Intravenous  Once 06/18/23 1107 06/18/23 1124   06/18/23 1103   sodium chloride 0.9 % with cefoTEtan (CEFOTAN) ADS Med       Note to Pharmacy: Payton Emerald A: cabinet override      06/18/23 1103 06/18/23 1133   06/06/23 0600  cefoTEtan (CEFOTAN) 2 g in sodium chloride 0.9 % 100 mL IVPB        2 g 200 mL/hr over 30 Minutes Intravenous On call to O.R. 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 Andrey Campanile POD # 2 s/p Exploratory laparotomy with takedown and subsequent re-placement of jejunostomy tube (red rubber), lysis of adhesions by Dr. Cliffton Asters for sbo at J-J site  - Surgical path negative for H pylori and negative for malignancy - Cont PPI + Carafate  - Cont PCA today. Consider switching back to PRN tomorrow. Cont IV Tylenol and Robaxin. Avoid NSAIDs - Cont scheduled Reglan and PRN anti-emetics.  - Adv TF's to goal. No whole or crushed meds via J-tube to avoid clogging - Cont G-tube to gravity. Cont abdominal binder to avoid tension/pulling on G-tube. Cont destin around G-tube site. Output slowing from G-tube. If leakage around G-tube continues to improves/stops and output from  G-tube remains low could consider clamping G-tube in future.  - Mobilize, PT - no fu recommended   - J tube resecured at bedside  ID - Cefotetan periop x 2. None currently.  FEN - TF's at  goal. G-tube to gravity, NPO VTE - SCDs, Lovenox Foley - out and voiding   HTN - PRN meds while NPO Hx Seizures - Home meds   Complaining of burning when she urinates-send UA- normal may need ditropan    Add Robaxin back to her regimen to see if this helps improve pain control.   She appears quite comfortable now.  LOS: 16 days    Dortha Schwalbe  MD   06/22/2023

## 2023-06-22 NOTE — Progress Notes (Signed)
Pt has been re-educated about the abdominal binder. Pt states that the binder hurts and continue to rise. This nurse explain that the binder need to be tight to hold the tubes in place.   J-tube is unclog and TF continue as order, dressing change.

## 2023-06-22 NOTE — Progress Notes (Signed)
Peripherally Inserted Central Catheter Placement  The IV Nurse has discussed with the patient and/or persons authorized to consent for the patient, the purpose of this procedure and the potential benefits and risks involved with this procedure.  The benefits include less needle sticks, lab draws from the catheter, and the patient may be discharged home with the catheter. Risks include, but not limited to, infection, bleeding, blood clot (thrombus formation), and puncture of an artery; nerve damage and irregular heartbeat and possibility to perform a PICC exchange if needed/ordered by physician.  Alternatives to this procedure were also discussed.  Bard Power PICC patient education guide, fact sheet on infection prevention and patient information card has been provided to patient /or left at bedside.    PICC Placement Documentation  PICC Double Lumen 06/22/23 Right Brachial 34 cm 0 cm (Active)  Indication for Insertion or Continuance of Line Prolonged intravenous therapies;Limited venous access - need for IV therapy >5 days (PICC only) 06/22/23 1324  Exposed Catheter (cm) 0 cm 06/22/23 1324  Site Assessment Clean, Dry, Intact 06/22/23 1324  Lumen #1 Status Flushed;Saline locked;Blood return noted 06/22/23 1324  Lumen #2 Status Flushed;Saline locked;Blood return noted 06/22/23 1324  Dressing Type Transparent;Securing device 06/22/23 1324  Dressing Status Antimicrobial disc in place;Clean, Dry, Intact 06/22/23 1324  Line Care Connections checked and tightened 06/22/23 1324  Line Adjustment (NICU/IV Team Only) No 06/22/23 1324  Dressing Intervention New dressing;Adhesive placed at insertion site (IV team only) 06/22/23 1324  Dressing Change Due 06/29/23 06/22/23 1324       Elliot Dally 06/22/2023, 1:25 PM

## 2023-06-23 LAB — GLUCOSE, CAPILLARY
Glucose-Capillary: 125 mg/dL — ABNORMAL HIGH (ref 70–99)
Glucose-Capillary: 145 mg/dL — ABNORMAL HIGH (ref 70–99)
Glucose-Capillary: 125 mg/dL — ABNORMAL HIGH (ref 70–99)
Glucose-Capillary: 110 mg/dL — ABNORMAL HIGH (ref 70–99)
Glucose-Capillary: 123 mg/dL — ABNORMAL HIGH (ref 70–99)

## 2023-06-23 MED ORDER — SODIUM CHLORIDE 0.9 % IV SOLN: INTRAVENOUS | Status: DC | PRN

## 2023-06-23 NOTE — Progress Notes (Signed)
Physical Therapy Treatment Patient Details Name: Lindsay Frost MRN: [id number] DOB: 1956-08-21 Today's Date: 06/23/2023   History of Present Illness 67 y.o. 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 10/2 S/P  s/p Exploratory laparotomy with takedown and subsequent re-placement of jejunostomy tube (red rubber), lysis of adhesions. PMH: back pain, cholecystectomy, gastric ulcer, depression.    PT Comments  Patient received in recliner. She is agreeable to PT session. Patient requires assistance to manage lines in preparation for mobility. She performed sit to stand with supervision. Ambulated 250 feet  holding to IV pole with supervision. No lob. Flexed posture and decreased cadence. Patient will continue to benefit from skilled PT to improve strength, activity tolerance and safety.     If plan is discharge home, recommend the following: A little help with walking and/or transfers;A little help with bathing/dressing/bathroom;Assistance with cooking/housework;Assist for transportation   Can travel by private vehicle      yes  Equipment Recommendations  BSC/3in1    Recommendations for Other Services       Precautions / Restrictions Precautions Precautions: Fall Precaution Comments: abdominal surgery, foley, PEG, JG drain, mod fall Required Braces or Orthoses: Other Brace Other Brace: abdominal binder Restrictions Weight Bearing Restrictions: No     Mobility  Bed Mobility               General bed mobility comments: OOB in recliner    Transfers Overall transfer level: Modified independent Equipment used: None Transfers: Sit to/from Stand Sit to Stand: Modified independent (Device/Increase time)                Ambulation/Gait Ambulation/Gait assistance: Supervision Gait Distance  (Feet): 250 Feet Assistive device: IV Pole Gait Pattern/deviations: Step-through pattern, Decreased step length - right, Decreased step length - left, Narrow base of support, Trunk flexed Gait velocity: decreased     General Gait Details: no LOB   Stairs             Wheelchair Mobility     Tilt Bed    Modified Rankin (Stroke Patients Only)       Balance Overall balance assessment: Mild deficits observed, not formally tested                                          Cognition Arousal: Alert Behavior During Therapy: WFL for tasks assessed/performed Overall Cognitive Status: Within Functional Limits for tasks assessed                                          Exercises      General Comments        Pertinent Vitals/Pain Pain Assessment Pain Assessment: 0-10 Pain Score: 7  Pain Location: abdomen and back Pain Descriptors / Indicators: Discomfort Pain Intervention(s): Monitored during session, Repositioned    Home Living                          Prior Function            PT Goals (current goals can now be found in the care plan section) Acute Rehab PT Goals PT Goal Formulation:  With patient Time For Goal Achievement: 06/26/23 Potential to Achieve Goals: Good Progress towards PT goals: Progressing toward goals    Frequency    Min 1X/week      PT Plan      Co-evaluation              AM-PAC PT "6 Clicks" Mobility   Outcome Measure  Help needed turning from your back to your side while in a flat bed without using bedrails?: None Help needed moving from lying on your back to sitting on the side of a flat bed without using bedrails?: None Help needed moving to and from a bed to a chair (including a wheelchair)?: A Little Help needed standing up from a chair using your arms (e.g., wheelchair or bedside chair)?: None Help needed to walk in hospital room?: A Little Help needed climbing 3-5 steps  with a railing? : A Little 6 Click Score: 21    End of Session   Activity Tolerance: Patient tolerated treatment well Patient left: in chair;with call bell/phone within reach Nurse Communication: Mobility status PT Visit Diagnosis: Other abnormalities of gait and mobility (R26.89);Muscle weakness (generalized) (M62.81);Pain;Difficulty in walking, not elsewhere classified (R26.2) Pain - part of body:  (abdomen and back)     Time: 5784-6962 PT Time Calculation (min) (ACUTE ONLY): 14 min  Charges:    $Gait Training: 8-22 mins PT General Charges $$ ACUTE PT VISIT: 1 Visit                     Averi Cacioppo, PT, GCS 06/23/23,1:35 PM

## 2023-06-23 NOTE — Progress Notes (Signed)
5 Days Post-Op   Subjective/Chief Complaint: DOESN'T LIKE THE BINDER    Objective: Vital signs in last 24 hours: Temp:  [97.9 F (36.6 C)-98.7 F (37.1 C)] 98.3 F (36.8 C) (10/07 0841) Pulse Rate:  [88-92] 91 (10/07 0841) Resp:  [14-21] 17 (10/07 0841) BP: (116-130)/(64-70) 130/66 (10/07 0841) SpO2:  [95 %-100 %] 96 % (10/07 0747) Weight:  [53.6 kg] 53.6 kg (10/07 0500) Last BM Date : 06/22/23  Intake/Output from previous day: 10/06 0701 - 10/07 0700 In: 941 [IV Piggyback:200] Out: 900 [Urine:600; Drains:300] Intake/Output this shift: Total I/O In: 60 [Other:60] Out: -    Card: Reg Lungs: Normal rate and effort.  Abd: Soft, mild distension. TTP around her midline incision that appears appropriate. Also with some ttp around her g-tube. Otherwise NT. No rigidity or guarding. G-tube site with some bilious drainage on split gauze. Stitch in place securing G-tube. There is some localized irritation, erythema and induration around the G-tube site. G-tube is currently to gravity with bilious drainage in gravity bag staples intact Clean without erythema  Lab Results:  Recent Labs    06/21/23 0757  WBC 15.5*  HGB 9.0*  HCT 28.3*  PLT 621*   BMET Recent Labs    06/21/23 0757  NA 131*  K 4.1  CL 100  CO2 21*  GLUCOSE 127*  BUN 9  CREATININE 0.51  CALCIUM 7.7*   PT/INR No results for input(s): "LABPROT", "INR" in the last 72 hours. ABG No results for input(s): "PHART", "HCO3" in the last 72 hours.  Invalid input(s): "PCO2", "PO2"  Studies/Results: [place] EKG SITE RITE  Result Date: 06/22/2023 If Site Rite image not attached, placement could not be confirmed due to current cardiac rhythm.   Anti-infectives: Anti-infectives (From admission, onward)    Start     Dose/Rate Route Frequency Ordered Stop   06/18/23 1115  cefoTEtan (CEFOTAN) 2 g in sodium chloride 0.9 % 100 mL IVPB        2 g 200 mL/hr over 30 Minutes Intravenous  Once 06/18/23 1107 06/18/23 1124    06/18/23 1103  sodium chloride 0.9 % with cefoTEtan (CEFOTAN) ADS Med       Note to Pharmacy: Payton Emerald A: cabinet override      06/18/23 1103 06/18/23 1133   06/06/23 0600  cefoTEtan (CEFOTAN) 2 g in sodium chloride 0.9 % 100 mL IVPB        2 g 200 mL/hr over 30 Minutes Intravenous On call to O.R. 06/06/23 0555 06/06/23 0818       Assessment/Plan: s/p Procedure(s): EXPLORATION LAPAROTOMY, INSERTION OF JEJUNAL FEEDING TUBE (N/A) Gastric outlet obstruction due to chronic peptic ulcer disease    POD#14 s/p Exploratory laparotomy, Distal gastrectomy with Roux-en-Y gastrojejunostomy, Placement of 24-French gastric tube, Placement of 18-French jejunostomy tube 9/20 Dr. Andrey Campanile POD # 5 s/p Exploratory laparotomy with takedown and subsequent re-placement of jejunostomy tube (red rubber), lysis of adhesions by Dr. Cliffton Asters for sbo at J-J site  - Surgical path negative for H pylori and negative for malignancy - Cont PPI + Carafate  - Cont PCA today. Consider switching back to PRN tomorrow. Cont IV Tylenol and Robaxin. Avoid NSAIDs - Cont scheduled Reglan and PRN anti-emetics.  - Adv TF's to goal. No whole or crushed meds via J-tube to avoid clogging - Cont G-tube to gravity. Cont abdominal binder to avoid tension/pulling on G-tube. Cont destin around G-tube site. Output slowing from G-tube. If leakage around G-tube continues to improves/stops and output from  G-tube remains low could consider clamping G-tube in future.  - Mobilize, PT - no fu recommended   - J tube resecured at bedside  ID - Cefotetan periop x 2. None currently.  FEN - TF's at  goal. G-tube to gravity, NPO VTE - SCDs, Lovenox Foley - out and voiding   check UGI this week    HTN - PRN meds while NPO Hx Seizures - Home meds  LOS: 17 days    Dortha Schwalbe MD  06/23/2023

## 2023-06-23 NOTE — Progress Notes (Signed)
   06/23/23 1124  Mobility  Activity Transferred from bed to chair;Transferred to/from [facility]  Level of Assistance Contact guard assist, steadying assist  Assistive Device Other (Comment) (IV Pole)  Distance Ambulated (ft) 10 ft  Activity Response Tolerated fair  Mobility Referral Yes  $Mobility charge 1 Mobility  Mobility Specialist Start Time (ACUTE ONLY) 1030  Mobility Specialist Stop Time (ACUTE ONLY) 1050  Mobility Specialist Time Calculation (min) (ACUTE ONLY) 20 min   Mobility Specialist: Progress Note  Pt agreeable to mobility session - received in bed. Required CG using IV Pole. C/o "the pressure at the binder is pushing on my bladder causing me to pee a lot" rated 8/10. Returned to chair with all needs met - call bell within reach.   Barnie Mort, BS Mobility Specialist Please contact via SecureChat or Rehab office at [phone].

## 2023-06-23 NOTE — TOC Progression Note (Signed)
Transition of Care [facility]) - Progression Note    Patient Details  Name: Lindsay Frost MRN: [id number] Date of Birth: [birth year]  Transition of Care [facility]) CM/SW Contact  Franchesca Veneziano, Adria Devon, RN Phone Number: 06/23/2023, 2:19 PM  Clinical Narrative:     TOC continues to follow for disposition needs   Expected Discharge Plan: Home w Home Health Services Barriers to Discharge: Continued Medical Work up  Expected Discharge Plan and Services   Discharge Planning Services: CM Consult Post Acute Care Choice: Home Health, Durable Medical Equipment Living arrangements for the past 2 months: Apartment                 DME Arranged: 3-N-1                     Social Determinants of Health (SDOH) Interventions SDOH Screenings   Food Insecurity: No Food Insecurity (06/19/2023)  Housing: Low Risk  (06/19/2023)  Transportation Needs: No Transportation Needs (06/19/2023)  Utilities: Not At Risk (06/19/2023)  Alcohol Screen: Low Risk  (11/13/2022)  Depression (PHQ2-9): Low Risk  (02/18/2023)  Financial Resource Strain: Low Risk  (02/17/2023)  Physical Activity: Insufficiently Active (02/17/2023)  Social Connections: Socially Isolated (02/17/2023)  Stress: No Stress Concern Present (02/17/2023)  Tobacco Use: Low Risk  (06/18/2023)    Readmission Risk Interventions     No data to display

## 2023-06-23 NOTE — Plan of Care (Signed)

## 2023-06-23 NOTE — Progress Notes (Signed)
Occupational Therapy Treatment Patient Details Name: Lindsay Frost MRN: [id number] DOB: 1955/11/12 Today's Date: 06/23/2023   History of present illness 67 y.o. 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 10/2 S/P  s/p Exploratory laparotomy with takedown and subsequent re-placement of jejunostomy tube (red rubber), lysis of adhesions. PMH: back pain, cholecystectomy, gastric ulcer, depression.   OT comments  Pt seated in recliner and agreeable to OT session.  Worked on mobility in room, min guard in room with pt using IV pole.  Standing at sink for grooming with supervision, toilet transfers and toileting with min guard.  Remains limited by pain in abdomen and back, reports tolerating abdominal binder today.  Will follow acutely.       If plan is discharge home, recommend the following:  Assistance with cooking/housework;Assist for transportation   Equipment Recommendations  BSC/3in1    Recommendations for Other Services      Precautions / Restrictions Precautions Precautions: Fall Precaution Comments: abdominal surgery, foley, PEG, JG drain Required Braces or Orthoses: Other Brace Other Brace: abdominal binder Restrictions Weight Bearing Restrictions: No       Mobility Bed Mobility               General bed mobility comments: OOB in recliner    Transfers Overall transfer level: Needs assistance Equipment used: None Transfers: Sit to/from Stand Sit to Stand: Contact guard assist           General transfer comment: for safety, hand placement     Balance Overall balance assessment: Mild deficits observed, not formally tested                                         ADL either performed or assessed with clinical judgement   ADL Overall ADL's : Needs  assistance/impaired     Grooming: Wash/dry face;Supervision/safety;Standing               Lower Body Dressing: Contact guard assist;Sit to/from stand   Toilet Transfer: Contact guard Marine scientist Details (indicate cue type and reason): pt using IV pole         Functional mobility during ADLs: Contact guard assist;Cueing for safety      Extremity/Trunk Assessment              Vision       Perception     Praxis      Cognition Arousal: Alert Behavior During Therapy: WFL for tasks assessed/performed, Flat affect Overall Cognitive Status: Within Functional Limits for tasks assessed                                          Exercises      Shoulder Instructions       General Comments      Pertinent Vitals/ Pain       Pain Assessment Pain Assessment: Faces Faces Pain Scale: Hurts little more Pain Location: abdomen and back Pain Descriptors / Indicators: Grimacing, Guarding Pain Intervention(s): Limited activity within patient's tolerance, Monitored during session, Repositioned, PCA encouraged  Home Living  Prior Functioning/Environment              Frequency  Min 1X/week        Progress Toward Goals  OT Goals(current goals can now be found in the care plan section)  Progress towards OT goals: Progressing toward goals  Acute Rehab OT Goals Patient Stated Goal: home OT Goal Formulation: With patient Time For Goal Achievement: 06/26/23 Potential to Achieve Goals: Good  Plan      Co-evaluation                 AM-PAC OT "6 Clicks" Daily Activity     Outcome Measure   Help from another person eating meals?: None Help from another person taking care of personal grooming?: A Little Help from another person toileting, which includes using toliet, bedpan, or urinal?: A Little Help from another person bathing (including washing, rinsing,  drying)?: A Little Help from another person to put on and taking off regular upper body clothing?: A Little Help from another person to put on and taking off regular lower body clothing?: A Little 6 Click Score: 19    End of Session    OT Visit Diagnosis: Unsteadiness on feet (R26.81);Muscle weakness (generalized) (M62.81);Other symptoms and signs involving cognitive function   Activity Tolerance Patient tolerated treatment well   Patient Left in chair;with call bell/phone within reach   Nurse Communication Mobility status        Time: 3016-0109 OT Time Calculation (min): 20 min  Charges: OT General Charges $OT Visit: 1 Visit OT Treatments $Self Care/Home Management : 8-22 mins  Barry Brunner, OT Acute Rehabilitation Services Office [phone]   Lindsay Frost 06/23/2023, 1:10 PM

## 2023-06-24 LAB — URINALYSIS, ROUTINE W REFLEX MICROSCOPIC
Bilirubin Urine: NEGATIVE
Glucose, UA: NEGATIVE mg/dL
Ketones, ur: NEGATIVE mg/dL
Nitrite: NEGATIVE
Protein, ur: NEGATIVE mg/dL
Specific Gravity, Urine: 1.006 (ref 1.005–1.030)
pH: 7 (ref 5.0–8.0)

## 2023-06-24 LAB — CBC
HCT: 28.5 % — ABNORMAL LOW (ref 36.0–46.0)
Hemoglobin: 8.9 g/dL — ABNORMAL LOW (ref 12.0–15.0)
MCH: 28.7 pg (ref 26.0–34.0)
MCHC: 31.2 g/dL (ref 30.0–36.0)
MCV: 91.9 fL (ref 80.0–100.0)
Platelets: 634 10*3/uL — ABNORMAL HIGH (ref 150–400)
RBC: 3.1 MIL/uL — ABNORMAL LOW (ref 3.87–5.11)
RDW: 13.3 % (ref 11.5–15.5)
WBC: 9.3 10*3/uL (ref 4.0–10.5)
nRBC: 0 % (ref 0.0–0.2)

## 2023-06-24 LAB — GLUCOSE, CAPILLARY
Glucose-Capillary: 115 mg/dL — ABNORMAL HIGH (ref 70–99)
Glucose-Capillary: 117 mg/dL — ABNORMAL HIGH (ref 70–99)
Glucose-Capillary: 120 mg/dL — ABNORMAL HIGH (ref 70–99)
Glucose-Capillary: 124 mg/dL — ABNORMAL HIGH (ref 70–99)
Glucose-Capillary: 130 mg/dL — ABNORMAL HIGH (ref 70–99)
Glucose-Capillary: 91 mg/dL (ref 70–99)
Glucose-Capillary: 97 mg/dL (ref 70–99)

## 2023-06-24 MED ORDER — OSMOLITE 1.2 CAL PO LIQD
1000.0000 mL | ORAL | Status: DC
  Administered 2023-06-24 – 2023-06-25 (×2): 1000 mL
  Filled 2023-06-24 (×2): qty 1000

## 2023-06-24 MED ORDER — FREE WATER
90.0000 mL | Status: DC
  Administered 2023-06-24 – 2023-06-28 (×23): 90 mL

## 2023-06-24 MED ORDER — OXYCODONE HCL 5 MG/5ML PO SOLN
5.0000 mg | ORAL | Status: DC | PRN
  Administered 2023-06-24: 5 mg
  Administered 2023-06-25 (×4): 10 mg
  Administered 2023-06-25: 5 mg
  Administered 2023-06-26 – 2023-07-04 (×23): 10 mg
  Administered 2023-07-04: 5 mg
  Administered 2023-07-04 – 2023-07-06 (×8): 10 mg
  Administered 2023-07-07: 5 mg
  Administered 2023-07-07 – 2023-07-09 (×7): 10 mg
  Filled 2023-06-24 (×3): qty 10
  Filled 2023-06-24 (×2): qty 5
  Filled 2023-06-24 (×4): qty 10
  Filled 2023-06-24: qty 5
  Filled 2023-06-24 (×28): qty 10
  Filled 2023-06-24: qty 5
  Filled 2023-06-24 (×8): qty 10

## 2023-06-24 MED ORDER — ADULT MULTIVITAMIN LIQUID CH
15.0000 mL | Freq: Two times a day (BID) | ORAL | Status: DC
  Administered 2023-06-24 – 2023-07-09 (×30): 15 mL
  Filled 2023-06-24 (×30): qty 15

## 2023-06-24 MED ORDER — ACETAMINOPHEN 160 MG/5ML PO SOLN
650.0000 mg | Freq: Four times a day (QID) | ORAL | Status: DC
  Administered 2023-06-24 – 2023-07-09 (×57): 650 mg
  Filled 2023-06-24 (×57): qty 20.3

## 2023-06-24 MED ORDER — CHOLECALCIFEROL 10 MCG/ML (400 UNIT/ML) PO LIQD
1000.0000 [IU] | Freq: Every day | ORAL | Status: DC
  Administered 2023-06-25 – 2023-07-10 (×16): 1000 [IU]
  Filled 2023-06-24 (×16): qty 2.5

## 2023-06-24 MED ORDER — HYDROMORPHONE HCL 1 MG/ML IJ SOLN
0.5000 mg | INTRAMUSCULAR | Status: DC | PRN
  Administered 2023-06-24 – 2023-06-28 (×23): 1 mg via INTRAVENOUS
  Administered 2023-06-28: 0.5 mg via INTRAVENOUS
  Administered 2023-06-28 – 2023-06-30 (×15): 1 mg via INTRAVENOUS
  Filled 2023-06-24 (×41): qty 1

## 2023-06-24 NOTE — Plan of Care (Signed)
Pt had 1 BM overnight. Pt c/o pain at the surgical incision site overnight that is littler more in compare to yesterday. Pt's PCA usage does reflect that increase pain. Encouraged patient to use the PCA as she is still not reaching the max.   Pt incision continuous to drain minimum serosanguinous fluid.   Urine urgency and increased frequency but urine yellow, clear, no odor, no pain or burning with urination. Problem: Education: Goal: Knowledge of General Education information will improve Description: Including pain rating scale, medication(s)/side effects and non-pharmacologic comfort measures Outcome: Progressing   Problem: Health Behavior/Discharge Planning: Goal: Ability to manage health-related needs will improve Outcome: Progressing   Problem: Clinical Measurements: Goal: Ability to maintain clinical measurements within normal limits will improve Outcome: Progressing Goal: Will remain free from infection Outcome: Progressing   Problem: Activity: Goal: Risk for activity intolerance will decrease Outcome: Progressing   Problem: Nutrition: Goal: Adequate nutrition will be maintained Outcome: Progressing   Problem: Elimination: Goal: Will not experience complications related to bowel motility Outcome: Progressing

## 2023-06-24 NOTE — Progress Notes (Signed)
Nutrition Follow-up  DOCUMENTATION CODES:   Severe malnutrition in context of chronic illness, Underweight  INTERVENTION:  Plan is to start condensing tube feeds via J-tube (red rubber catheter) today: -Provide Osmolite 1.2 at 72 mL/hour x 20 hours (6PM-2PM) via J-tube (total of 1440 mL) -Provides: 1728 kcal, 80 grams of protein, 1181 mL H2O daily  If pt tolerates tube feeds over 20 hours via J-tube, plan is to condense tomorrow to 16 hours: -Osmolite 1.2 at 90 mL/hour x 16 hours (6PM-10AM) via J-tube (total of 1440 mL)  Provide free water flush of 90 mL every 4 hours via J-tube. Provides a total of 1721 mL H2O daily including water in tube feeds.  Vitamin D3 supplement changed back to liquid form today. Continue liquid vitamin D3 1000 international units daily x 30 days and then recommend re-checking vitamin D level.  Provide liquid MVI BID per tube to help meet micronutrient needs in setting of anatomy and risk for decreased absorption.  NUTRITION DIAGNOSIS:   Severe Malnutrition related to chronic illness (PUD, gastroparesis, esophagitis, duodenal bulb stenosis) as evidenced by severe fat depletion, severe muscle depletion.  Ongoing - addressing with tube feed regimen.  GOAL:   Patient will meet greater than or equal to 90% of their needs  Met with tube feeds.  MONITOR:   TF tolerance, Labs, Weight trends  REASON FOR ASSESSMENT:   Consult Enteral/tube feeding initiation and management (resume trickle tube feeds)  ASSESSMENT:   67 yo female admitted with GOO d/t chronic PUD. S/P distal gastrectomy with roux en y gastrojejunostomy and placement of gastric and jejunal feeding tubes 9/20. PMH includes PUD, gastroparesis, duodenal diverticulum, duodenal bulb stenosis, esophagitis, GERD, hiatal hernia, gallstones, meningioma, HTN, seizures, B-12 deficiency.  9/20: s/p exlap, distal gastrectomy with roux-en-y gastrojejunostomy, placement of 24 Fr. G-tube and 18 Fr.  J-tube 9/22: trickle TF initiated 9/25: s/p abd xray-no bowel obstruction; TF advanced to 32ml/h  10/2: exlap with takedown, re-placement of J-tube (red rubber tube), lysis of adhesions for SBO at J-J site  Met with pt at bedside. She reports she is tolerating tube feeds well. Documented to have had a type 4 BM yesterday. Pt reports she had another BM this AM that was soft but formed. Reports some occasional nausea and that she is receiving medication for this. Reports some occasional pain at incision site. Noted plan per surgery is to consider clamping G-tube today.  Admission wt 47.6 kg. Weights have fluctuated in chart. Currently 51.8 kg. Will continue to monitor weight trends.  Enteral Access: J-tube (red rubber catheter) placed 10/2 used for feeds and water flushes Also with 24 Fr. G-tube that has been open to gravity drainage  Tube feeds: Osmolite 1.2 at 60 mL/hour x 24 hours (1440 mL daily) Free water: 30 mL every 4 hours Provides: 1728 kcal, 80 grams of protein, 1361 mL H2O daily (1181 mL from feeds + 180 mL from water flushes)  Medications reviewed and include: vitamin D3 tablet 1000 international units daily (previously receiving liquid and changed to tablet on 10/4 per review of chart), Reglan 5 mg every 6 hrs IV, pantoprazole, Carafate 1 gram TID and at bedtime, Keppra  Labs reviewed: CBG 110-145.   Micronutrient Panel: CRP: 3.5 H 06/09/23 25-OH Vitamin D: 26.41 L 06/10/23 Vitamin B12: 745 WNL 06/10/23 Vitamin C: 0.3 L 06/10/23 Zinc: 64 WNL 06/10/23  UOP: 5 occurrences unmeasured UOP in previous 24 hrs  I/O: +28518.2 mL since 06/09/41 - suspect not representative of true I/O as exact  UOP not being measured  Discussed with RN outside of room.  Discussed with Surgical PA and Surgeon via secure chat. Plan is to start slowly condensing feeds as tolerated. Will condense to 20 hours today and if tolerated condense to 16 hours. If needed, can always consider higher 1.5 kcal/mL  formula. Plan is to add free water flush of 90 mL every 4 hours today to better meet fluid needs. MD plans to check BMP tomorrow.  Discussed with Pharmacy via secure chat regarding concern for vitamin D tablet (previously on liquid vitamin D) and risk for clogging J-tube. Plan is to change back to liquid form of vitamin D.  Diet Order:   Diet Order             Diet NPO time specified Except for: Ice Chips, Other (See Comments)  Diet effective now                  EDUCATION NEEDS:   Education needs have been addressed  Skin:  Skin Assessment: Skin Integrity Issues: Skin Integrity Issues:: Incisions Incisions: surgical incision to abdomen  Last BM:  10/1 (type 6 medium)  Height:   Ht Readings from Last 1 Encounters:  06/18/23 5\' 4"  (1.626 m)   Weight:   Wt Readings from Last 1 Encounters:  06/24/23 51.8 kg   Ideal Body Weight:  54.5 kg  BMI:  Body mass index is 19.6 kg/m.  Estimated Nutritional Needs:   Kcal:  1650-1850  Protein:  70-90 gm  Fluid:  1.7-1.9 L  Tiyon Sanor Tollie Eth, MS, RD, LDN, CNSC Pager number available on Amion

## 2023-06-24 NOTE — Progress Notes (Signed)
   06/24/23 1200  Mobility  Activity Ambulated with assistance in hallway;Transferred to/from [facility]  Level of Assistance Standby assist, set-up cues, supervision of patient - no hands on  Assistive Device Other (Comment) (Iv Pole)  Distance Ambulated (ft) 150 ft  Activity Response Tolerated fair  Mobility Referral Yes  $Mobility charge 1 Mobility  Mobility Specialist Start Time (ACUTE ONLY) 1145  Mobility Specialist Stop Time (ACUTE ONLY) 1230  Mobility Specialist Time Calculation (min) (ACUTE ONLY) 45 min   Mobility Specialist: Progress Note  Pt agreeable to mobility session - received in bed. Required SV throughout using bedrails,countertops, and IV Pole. C/o pain at surgical site rated 8/10 - RN aware. Returned to bed with all needs met - call bell within reach. NT present for part of session.   Barnie Mort, BS Mobility Specialist Please contact via SecureChat or Rehab office at [phone].

## 2023-06-24 NOTE — Progress Notes (Addendum)
6 Days Post-Op   Subjective/Chief Complaint: Having some urinary hesitancy and pressure. Tolerating J tube feeds. G tube with less drainage.    Objective: Vital signs in last 24 hours: Temp:  [97.8 F (36.6 C)-98.4 F (36.9 C)] 98 F (36.7 C) (10/08 0822) Pulse Rate:  [89-96] 96 (10/08 0822) Resp:  [15-20] 17 (10/08 0822) BP: (117-135)/(65-74) 120/71 (10/08 0822) SpO2:  [94 %-99 %] 99 % (10/08 0822) FiO2 (%):  [21 %] 21 % (10/08 0731) Weight:  [51.8 kg] 51.8 kg (10/08 0420) Last BM Date : 06/22/23  Intake/Output from previous day: 10/07 0701 - 10/08 0700 In: 2211.2 [P.O.:20; ZO/XW:9604.5; IV Piggyback:357.5] Out: 225 [Drains:225] Intake/Output this shift: Total I/O In: 405 [NG/GT:405] Out: -    Card: Reg Lungs: Normal rate and effort.  Abd: Soft, mild distension. TTP around her midline incision that appears appropriate. Also with some ttp around her g-tube. Otherwise NT. No rigidity or guarding. G-tube site with some bilious drainage on split gauze. Stitch in place securing J-tube. There is some localized irritation, erythema and induration around the G-tube site. G-tube is currently to gravity with bilious drainage in gravity bag staples intact Clean without erythema   Lab Results:  Recent Labs    06/24/23 0341  WBC 9.3  HGB 8.9*  HCT 28.5*  PLT 634*   BMET No results for input(s): "NA", "K", "CL", "CO2", "GLUCOSE", "BUN", "CREATININE", "CALCIUM" in the last 72 hours.  PT/INR No results for input(s): "LABPROT", "INR" in the last 72 hours. ABG No results for input(s): "PHART", "HCO3" in the last 72 hours.  Invalid input(s): "PCO2", "PO2"  Studies/Results: No results found.  Anti-infectives: Anti-infectives (From admission, onward)    Start     Dose/Rate Route Frequency Ordered Stop   06/18/23 1115  cefoTEtan (CEFOTAN) 2 g in sodium chloride 0.9 % 100 mL IVPB        2 g 200 mL/hr over 30 Minutes Intravenous  Once 06/18/23 1107 06/18/23 1124   06/18/23  1103  sodium chloride 0.9 % with cefoTEtan (CEFOTAN) ADS Med       Note to Pharmacy: Payton Emerald A: cabinet override      06/18/23 1103 06/18/23 1133   06/06/23 0600  cefoTEtan (CEFOTAN) 2 g in sodium chloride 0.9 % 100 mL IVPB        2 g 200 mL/hr over 30 Minutes Intravenous On call to O.R. 06/06/23 0555 06/06/23 0818       Assessment/Plan: s/p Procedure(s): EXPLORATION LAPAROTOMY, INSERTION OF JEJUNAL FEEDING TUBE (N/A) Gastric outlet obstruction due to chronic peptic ulcer disease    POD#18 s/p Exploratory laparotomy, Distal gastrectomy with Roux-en-Y gastrojejunostomy, Placement of 24-French gastric tube, Placement of 18-French jejunostomy tube 9/20 Dr. Andrey Campanile POD #6 s/p Exploratory laparotomy with takedown and subsequent re-placement of jejunostomy tube (red rubber), lysis of adhesions by Dr. Cliffton Asters for sbo at J-J site  - Surgical path negative for H pylori and negative for malignancy - Cont PPI + Carafate  - switch to PO tylenol/roxicodone with PRN dilaudid for pain. DC PCA - Cont scheduled Reglan and PRN anti-emetics.  Morene Antu TF's @ goal - Dr. Andrey Campanile to discuss plans to cycle with dietician. No whole or crushed meds via J-tube to avoid clogging - Consider clamping G-tube today, if nausea or vomiting then reconnect to gravity  - Mobilize, PT - no fu recommended  - J tube resecured at bedside over the weekend  - not sure if UGI would be helpful at some point  ID - Cefotetan periop x 2. None currently.  FEN - Work to decrease interval of time for J-tube feeds, clamp G tube  VTE - SCDs, Lovenox Foley - out and voiding, some hesitancy, send UA    HTN - PRN meds while NPO Hx Seizures - Home meds  LOS: 18 days   Juliet Rude, [facility] Surgery 06/24/2023, 11:28 AM Please see Amion for pager number during day hours 7:00am-4:30pm

## 2023-06-25 LAB — GLUCOSE, CAPILLARY
Glucose-Capillary: 107 mg/dL — ABNORMAL HIGH (ref 70–99)
Glucose-Capillary: 108 mg/dL — ABNORMAL HIGH (ref 70–99)
Glucose-Capillary: 115 mg/dL — ABNORMAL HIGH (ref 70–99)
Glucose-Capillary: 119 mg/dL — ABNORMAL HIGH (ref 70–99)
Glucose-Capillary: 122 mg/dL — ABNORMAL HIGH (ref 70–99)
Glucose-Capillary: 86 mg/dL (ref 70–99)

## 2023-06-25 MED ORDER — METOCLOPRAMIDE HCL 5 MG/ML IJ SOLN
10.0000 mg | Freq: Four times a day (QID) | INTRAMUSCULAR | Status: DC
  Administered 2023-06-25 – 2023-07-10 (×57): 10 mg via INTRAVENOUS
  Filled 2023-06-25 (×59): qty 2

## 2023-06-25 MED ORDER — OSMOLITE 1.2 CAL PO LIQD
1000.0000 mL | ORAL | Status: DC
  Administered 2023-06-25 – 2023-06-28 (×4): 1000 mL
  Filled 2023-06-25 (×7): qty 1000

## 2023-06-25 NOTE — Progress Notes (Signed)
Pt c/o nausea overnight. PRN zofran and compazine given with decrease in nausea. G-tube also unclamped for 30 minutes to help with nausea. G-tube reclamped. G-tube output 

## 2023-06-25 NOTE — Progress Notes (Signed)
   06/25/23 1327  Mobility  Activity Transferred from chair to bed  Level of Assistance Standby assist, set-up cues, supervision of patient - no hands on  Assistive Device Other (Comment) (Iv Pole)  Distance Ambulated (ft) 10 ft  Activity Response Tolerated well  Mobility Referral Yes  $Mobility charge 1 Mobility  Mobility Specialist Start Time (ACUTE ONLY) 1320  Mobility Specialist Stop Time (ACUTE ONLY) 1327  Mobility Specialist Time Calculation (min) (ACUTE ONLY) 7 min   Mobility Specialist: Progress Note  Pt requested mobility session - received in chair. Required SV using IV Pole. Pt with no complaints. Returned to bed with all needs met - call bell within reach.   Barnie Mort, BS Mobility Specialist Please contact via SecureChat or Rehab office at [phone].

## 2023-06-25 NOTE — Progress Notes (Signed)
Nutrition Follow-up  DOCUMENTATION CODES:   Severe malnutrition in context of chronic illness, Underweight  INTERVENTION:  Plan is to condense tube feeds over 16 hours today via J-tube (red rubber catheter): -Provide Osmolite 1.2 at 90 mL/hour x 16 hours (6PM-10AM) via J-tube (total of 1440 mL) -Provides: 1728 kcal, 80 grams of protein, 1181 mL H2O daily  Continue free water flush of 90 mL every 4 hours via J-tube. Provides a total of 1721 mL H2O daily including water in tube feeds.  Continue liquid vitamin D3 1000 international units daily x 30 days and then recommend re-checking vitamin D level.  Continue liquid multivitamin with minerals BID per tube to help meet micronutrient needs in setting of anatomy and risk for decreased absorption.  NUTRITION DIAGNOSIS:   Severe Malnutrition related to chronic illness (PUD, gastroparesis, esophagitis, duodenal bulb stenosis) as evidenced by severe fat depletion, severe muscle depletion.  Ongoing - addressing with tube feed regimen.  GOAL:   Patient will meet greater than or equal to 90% of their needs  Met with tube feed regimen.  MONITOR:   TF tolerance, Labs, Weight trends  REASON FOR ASSESSMENT:   Consult Enteral/tube feeding initiation and management (resume trickle tube feeds)  ASSESSMENT:   67 yo female admitted with GOO d/t chronic PUD. S/P distal gastrectomy with roux en y gastrojejunostomy and placement of gastric and jejunal feeding tubes 9/20. PMH includes PUD, gastroparesis, duodenal diverticulum, duodenal bulb stenosis, esophagitis, GERD, hiatal hernia, gallstones, meningioma, HTN, seizures, B-12 deficiency.  9/20: s/p exlap, distal gastrectomy with roux-en-y gastrojejunostomy, placement of 24 Fr. G-tube and 18 Fr. J-tube 9/22: trickle TF initiated 9/25: s/p abd xray-no bowel obstruction; TF advanced to 23ml/h  10/2: exlap with takedown, re-placement of J-tube (red rubber tube), lysis of adhesions for SBO at J-J  site 10/8: tube feeds condensed to be over 20 hours 10/9: condensing tube feeds over 16 hours   Met with pt at bedside. She reports she tolerated higher rate of 72 mL/hour via J-tube. She reports having some nausea overnight after G-tube was clamped. She felt better after G-tube was unclamped by RN for a little while. Reports had a BM that was slightly less formed, but not liquid or watery. Documented to have a type 5 per [facility] Stool chart. Plan per Surgeon is to continue to clamp G-tube for several hours at a time and work towards continuous clamping. Per discussion with Surgeon via secure chat, plan is to transition to providing feeds over 16 hours today. Updated orders and discussed with RN over the phone.  Admission wt 47.6 kg. Weights have fluctuated in chart. Currently 52.7 kg. Will continue to monitor weight trends.  Enteral Access: J-tube (red rubber catheter) placed 10/2 used for feeds and water flushes Also with 24 Fr. G-tube that has been open to gravity drainage  Tube Feeds: Osmolite 1.2 at 72 mL/hour x 20 hours (1440 mL) Free water: 90 mL every 4 hours  Medications reviewed and include: liquid vitamin D3 1000 international units daily per tube, Reglan 5 mg Q6hrs IV, liquid multivitamin BID per tube, pantoprazole, Carafate 1 gram TID and at bedtime, Keppra  Labs reviewed: CBG 91-124  Micronutrient Panel: CRP: 3.5 H 06/09/23 25-OH Vitamin D: 26.41 L 06/10/23 Vitamin B12: 745 WNL 06/10/23 Vitamin C: 0.3 L 06/10/23 Zinc: 64 WNL 06/10/23  UOP: 3 occurrences unmeasured UOP in previous 24 hrs  G-tube output: 100 mL in previous 24 hours (this is what was drained when unclamped overnight after pt had nausea)  I/O: +28375.5 mL since 06/11/23  - suspect not representative of true I/O as exact UOP not being measured  Diet Order:   Diet Order             Diet NPO time specified Except for: Ice Chips, Other (See Comments)  Diet effective now                  EDUCATION NEEDS:    Education needs have been addressed  Skin:  Skin Assessment: Skin Integrity Issues: Skin Integrity Issues:: Incisions Incisions: surgical incision to abdomen  Last BM:  06/25/23 - small type 5  Height:   Ht Readings from Last 1 Encounters:  06/18/23 5\' 4"  (1.626 m)   Weight:   Wt Readings from Last 1 Encounters:  06/25/23 52.7 kg   Ideal Body Weight:  54.5 kg  BMI:  Body mass index is 19.94 kg/m.  Estimated Nutritional Needs:   Kcal:  1650-1850  Protein:  70-90 gm  Fluid:  1.7-1.9 L  Layn Kye Tollie Eth, MS, RD, LDN, CNSC Pager number available on Amion

## 2023-06-25 NOTE — Progress Notes (Signed)
7 Days Post-Op   Subjective/Chief Complaint: Had to have g tube unclamped yesterday pm for nausea Had 2 bms and needed pain med when got up to use bathroom Some drainage around G tube Asking in BS checks can be less frequent   Objective: Vital signs in last 24 hours: Temp:  [97.7 F (36.5 C)-97.9 F (36.6 C)] 97.7 F (36.5 C) (10/09 0823) Pulse Rate:  [89-106] 106 (10/09 0823) Resp:  [17-20] 17 (10/09 0823) BP: (107-130)/(62-71) 107/67 (10/09 0823) SpO2:  [97 %-98 %] 98 % (10/09 0823) Weight:  [52.7 kg] 52.7 kg (10/09 0434) Last BM Date : 06/25/23  Intake/Output from previous day: 10/08 0701 - 10/09 0700 In: 2075.8 [I.V.:10; NG/GT:1415.8; IV Piggyback:520] Out: 100 [Drains:100] Intake/Output this shift: Total I/O In: 150 [Other:150] Out: 450 [Drains:450]  Alert, nad, nontoxic Nonlabored G & J tubes ok. Some old drainage around G tube - skin ok Staples intact  Lab Results:  Recent Labs    06/24/23 0341  WBC 9.3  HGB 8.9*  HCT 28.5*  PLT 634*   BMET No results for input(s): "NA", "K", "CL", "CO2", "GLUCOSE", "BUN", "CREATININE", "CALCIUM" in the last 72 hours. PT/INR No results for input(s): "LABPROT", "INR" in the last 72 hours. ABG No results for input(s): "PHART", "HCO3" in the last 72 hours.  Invalid input(s): "PCO2", "PO2"  Studies/Results: No results found.  Anti-infectives: Anti-infectives (From admission, onward)    Start     Dose/Rate Route Frequency Ordered Stop   06/18/23 1115  cefoTEtan (CEFOTAN) 2 g in sodium chloride 0.9 % 100 mL IVPB        2 g 200 mL/hr over 30 Minutes Intravenous  Once 06/18/23 1107 06/18/23 1124   06/18/23 1103  sodium chloride 0.9 % with cefoTEtan (CEFOTAN) ADS Med       Note to Pharmacy: Payton Emerald A: cabinet override      06/18/23 1103 06/18/23 1133   06/06/23 0600  cefoTEtan (CEFOTAN) 2 g in sodium chloride 0.9 % 100 mL IVPB        2 g 200 mL/hr over 30 Minutes Intravenous On call to O.R. 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 Andrey Campanile POD #7 s/p Exploratory laparotomy with takedown and subsequent re-placement of jejunostomy tube (red rubber), lysis of adhesions by Dr. Cliffton Asters for sbo at J-J site  - Surgical path negative for H pylori and negative for malignancy - Cont PPI +; stop Carafate  -cont  tylenol/roxicodone with PRN dilaudid for pain. A - Cont scheduled Reglan and PRN anti-emetics.  - Tol TF's @ goal - will change flow rate to try to start cycling TF. If can't handle higher rate, will need to switch to a higher kcal formula - clamping G tube for 4hr and vent for 1hr and repeat cycle, if nausea or vomiting then reconnect to gravity  - Mobilize, PT - no fu recommended  - J tube resecured at bedside over the weekend  - not sure if UGI would be helpful at some point    ID - Cefotetan periop x 2. None currently.  FEN - Work to decrease interval of time for J-tube feeds, clamp G tube  VTE - SCDs, Lovenox Foley - out and voiding, some hesitancy,  send UA       HTN - PRN meds while NPO Hx Seizures - Home meds  LOS: 19 days    Lindsay Frost 06/25/2023

## 2023-06-25 NOTE — Progress Notes (Addendum)
   06/25/23 1310  Mobility  Activity Ambulated with assistance in hallway;Transferred to/from [facility]  Level of Assistance Standby assist, set-up cues, supervision of patient - no hands on  Assistive Device Other (Comment) (IV Pole)  Distance Ambulated (ft) 150 ft  Activity Response Tolerated well  Mobility Referral Yes  $Mobility charge 1 Mobility  Mobility Specialist Start Time (ACUTE ONLY) 1209  Mobility Specialist Stop Time (ACUTE ONLY) 1231  Mobility Specialist Time Calculation (min) (ACUTE ONLY) 22 min   Mobility Specialist: Progress Note  Pt agreeable to mobility session - received in chair. Required SV throughout using IV Pole. Pt with no complaints. Returned to chair with all needs met - call bell within reach.   Pt with yellow void and completed pericare independenlty.    Barnie Mort, BS Mobility Specialist Please contact via SecureChat or Rehab office at [phone].

## 2023-06-25 NOTE — Progress Notes (Signed)
Occupational Therapy Treatment Patient Details Name: Lindsay Frost MRN: [id number] DOB: April 01, 1956 Today's Date: 06/25/2023   History of present illness 67 y.o. 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 10/2 S/P  s/p Exploratory laparotomy with takedown and subsequent re-placement of jejunostomy tube (red rubber), lysis of adhesions. PMH: back pain, cholecystectomy, gastric ulcer, depression.   OT comments  Pt eager to participate, able to complete transfers/toileiting supervision, mod I for hygiene. Pt able to ambulate ~150 feet with supervision, started to feel tired/painful upon returning, not able to ambulate more. Pt completed pill box test in 7 minutes, able to immediately self correct one mistake, did ask clarifying questions during session, slight decreased problem solving skills. Pt would benefit from continued skilled OT, DC plan still appropriate.       If plan is discharge home, recommend the following:  Assistance with cooking/housework;Assist for transportation   Equipment Recommendations  BSC/3in1    Recommendations for Other Services      Precautions / Restrictions Precautions Precautions: Fall Precaution Comments: abdominal surgery, foley, PEG, JG drain, mod fall Required Braces or Orthoses: Other Brace Other Brace: abdominal binder Restrictions Weight Bearing Restrictions: No       Mobility Bed Mobility Overal bed mobility: Modified Independent                  Transfers Overall transfer level: Needs assistance Equipment used: None Transfers: Sit to/from Stand Sit to Stand: Supervision           General transfer comment: supervision for safety     Balance Overall balance assessment: Mild deficits observed, not formally tested                                          ADL either performed or assessed with clinical judgement   ADL Overall ADL's : Needs assistance/impaired Eating/Feeding: NPO   Grooming: Wash/dry face;Supervision/safety;Standing                   Toilet Transfer: Nurse, mental health and Hygiene: Sitting/lateral lean;Sit to/from stand;Modified independent       Functional mobility during ADLs: Supervision/safety General ADL Comments: able to complete transfers to [facility] with supervision, hygiene mod I    Extremity/Trunk Assessment Upper Extremity Assessment Upper Extremity Assessment: Overall WFL for tasks assessed            Vision       Perception     Praxis      Cognition Arousal: Alert Behavior During Therapy: WFL for tasks assessed/performed Overall Cognitive Status: Within Functional Limits for tasks assessed                                 General Comments: Compelted Pill Box Test. Pt able to complete in 7 minutes, able to self correct, did ask 2-3 clarifying questions        Exercises      Shoulder Instructions       General Comments      Pertinent Vitals/ Pain       Pain Assessment Pain Assessment: 0-10 Pain Score: 4  Pain Location: abdomen and back Pain Descriptors / Indicators: Discomfort Pain  Intervention(s): Monitored during session, RN gave pain meds during session  Home Living                                          Prior Functioning/Environment              Frequency  Min 1X/week        Progress Toward Goals  OT Goals(current goals can now be found in the care plan section)  Progress towards OT goals: Progressing toward goals  Acute Rehab OT Goals Patient Stated Goal: to manage pain OT Goal Formulation: With patient Time For Goal Achievement: 06/26/23 Potential to Achieve Goals: Good ADL Goals Pt Will Perform Lower Body Dressing: with modified independence;sit  to/from stand Pt Will Transfer to Toilet: with modified independence;ambulating Pt Will Perform Tub/Shower Transfer: Tub transfer;with modified independence;ambulating Additional ADL Goal #1: Pt will identify and implement 2+ energy conservation strategies for use in the home setting to optimize activity tolerance during her daily routines. Additional ADL Goal #2: Pt will complete pill box test with independence.  Plan      Co-evaluation                 AM-PAC OT "6 Clicks" Daily Activity     Outcome Measure   Help from another person eating meals?: None Help from another person taking care of personal grooming?: A Little Help from another person toileting, which includes using toliet, bedpan, or urinal?: A Little Help from another person bathing (including washing, rinsing, drying)?: A Little Help from another person to put on and taking off regular upper body clothing?: A Little Help from another person to put on and taking off regular lower body clothing?: A Little 6 Click Score: 19    End of Session Equipment Utilized During Treatment: Gait belt  OT Visit Diagnosis: Unsteadiness on feet (R26.81);Muscle weakness (generalized) (M62.81);Other symptoms and signs involving cognitive function   Activity Tolerance Patient tolerated treatment well   Patient Left in bed;with call bell/phone within reach   Nurse Communication Mobility status        Time: 5784-6962 OT Time Calculation (min): 33 min  Charges: OT General Charges $OT Visit: 1 Visit OT Treatments $Self Care/Home Management : 8-22 mins $Therapeutic Activity: 8-22 mins  Lindsay Frost, OTR/L   Lindsay Frost 06/25/2023, 3:24 PM

## 2023-06-25 NOTE — Plan of Care (Signed)

## 2023-06-25 NOTE — Progress Notes (Signed)
Pt tube feeds have been stop for now. J-tube was flush before disconnecting the tube feeds. Pt is aware. G-tube is still clamp, no complaint as of now. Continue with plan of care.

## 2023-06-26 LAB — BASIC METABOLIC PANEL
Anion gap: 8 (ref 5–15)
BUN: 11 mg/dL (ref 8–23)
CO2: 27 mmol/L (ref 22–32)
Calcium: 8 mg/dL — ABNORMAL LOW (ref 8.9–10.3)
Chloride: 99 mmol/L (ref 98–111)
Creatinine, Ser: 0.44 mg/dL (ref 0.44–1.00)
GFR, Estimated: >60 mL/min (ref >60)
Glucose, Bld: 115 mg/dL — ABNORMAL HIGH (ref 70–99)
Potassium: 4.1 mmol/L (ref 3.5–5.1)
Sodium: 134 mmol/L — ABNORMAL LOW (ref 135–145)

## 2023-06-26 LAB — MAGNESIUM: Magnesium: 1.9 mg/dL (ref 1.7–2.4)

## 2023-06-26 LAB — GLUCOSE, CAPILLARY
Glucose-Capillary: 127 mg/dL — ABNORMAL HIGH (ref 70–99)
Glucose-Capillary: 107 mg/dL — ABNORMAL HIGH (ref 70–99)
Glucose-Capillary: 116 mg/dL — ABNORMAL HIGH (ref 70–99)
Glucose-Capillary: 96 mg/dL (ref 70–99)

## 2023-06-26 MED ORDER — LORAZEPAM 1 MG PO TABS: 1.0000 mg | ORAL_TABLET | Freq: Three times a day (TID) | ORAL | Status: DC | PRN

## 2023-06-26 MED ORDER — LORAZEPAM 2 MG/ML PO CONC
1.0000 mg | Freq: Three times a day (TID) | ORAL | Status: DC | PRN
  Filled 2023-06-26: qty 0.5

## 2023-06-26 MED ORDER — LORAZEPAM 2 MG/ML PO CONC
1.0000 mg | Freq: Three times a day (TID) | ORAL | Status: DC | PRN
  Administered 2023-06-26 – 2023-07-08 (×28): 1 mg
  Filled 2023-06-26 (×11): qty 1
  Filled 2023-06-26: qty 0.5
  Filled 2023-06-26 (×18): qty 1

## 2023-06-26 NOTE — Progress Notes (Signed)
Physical Therapy Treatment Patient Details Name: Lindsay Frost MRN: [id number] DOB: [birth year] Today's Date: 06/26/2023   History of Present Illness 67 y.o. 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 10/2 S/P  s/p Exploratory laparotomy with takedown and subsequent re-placement of jejunostomy tube (red rubber), lysis of adhesions. PMH: back pain, cholecystectomy, gastric ulcer, depression.    PT Comments  Pt tolerated treatment well today. Pt able to progress ambulation in hallway with IV pole supervision. No change in DC/DME recs at this time. PT will continue to follow.    If plan is discharge home, recommend the following: A little help with walking and/or transfers;A little help with bathing/dressing/bathroom;Assistance with cooking/housework;Assist for transportation   Can travel by private vehicle        Equipment Recommendations  BSC/3in1    Recommendations for Other Services       Precautions / Restrictions Precautions Precautions: Fall Precaution Comments: abdominal surgery, foley, PEG, JG drain, mod fall Required Braces or Orthoses: Other Brace Other Brace: abdominal binder Restrictions Weight Bearing Restrictions: No     Mobility  Bed Mobility Overal bed mobility: Modified Independent                  Transfers Overall transfer level: Modified independent Equipment used: None Transfers: Sit to/from Stand Sit to Stand: Modified independent (Device/Increase time)                Ambulation/Gait Ambulation/Gait assistance: Supervision Gait Distance (Feet): 250 Feet Assistive device: IV Pole Gait Pattern/deviations: Step-through pattern, Decreased step length - right, Decreased step length - left, Narrow base of support, Trunk flexed Gait velocity: decreased      General Gait Details: no LOB   Stairs             Wheelchair Mobility     Tilt Bed    Modified Rankin (Stroke Patients Only)       Balance Overall balance assessment: Mild deficits observed, not formally tested                                          Cognition Arousal: Alert Behavior During Therapy: WFL for tasks assessed/performed Overall Cognitive Status: Within Functional Limits for tasks assessed                                          Exercises      General Comments General comments (skin integrity, edema, etc.): VSS      Pertinent Vitals/Pain Pain Assessment Pain Assessment: Faces Faces Pain Scale: Hurts little more Pain Location: abdomen and back Pain Descriptors / Indicators: Discomfort Pain Intervention(s): Monitored during session, Premedicated before session    Home Living                          Prior Function            PT Goals (current goals can now be found in the care plan section) Progress towards PT goals: Progressing toward goals    Frequency    Min 1X/week      PT Plan      Co-evaluation  AM-PAC PT "6 Clicks" Mobility   Outcome Measure  Help needed turning from your back to your side while in a flat bed without using bedrails?: None Help needed moving from lying on your back to sitting on the side of a flat bed without using bedrails?: None Help needed moving to and from a bed to a chair (including a wheelchair)?: A Little Help needed standing up from a chair using your arms (e.g., wheelchair or bedside chair)?: None Help needed to walk in hospital room?: A Little Help needed climbing 3-5 steps with a railing? : A Little 6 Click Score: 21    End of Session   Activity Tolerance: Patient tolerated treatment well Patient left: in bed;with call bell/phone within reach Nurse Communication: Mobility status PT Visit Diagnosis: Other abnormalities of  gait and mobility (R26.89);Muscle weakness (generalized) (M62.81);Pain;Difficulty in walking, not elsewhere classified (R26.2)     Time: 6440-3474 PT Time Calculation (min) (ACUTE ONLY): 9 min  Charges:    $Gait Training: 8-22 mins PT General Charges $$ ACUTE PT VISIT: 1 Visit                     Shela Nevin, PT, DPT Acute Rehab Services [phone]    [name] 06/26/2023, 12:41 PM

## 2023-06-26 NOTE — Progress Notes (Signed)
   06/26/23 1510  Mobility  Activity Transferred to/from [facility];Transferred from bed to chair  Level of Assistance Standby assist, set-up cues, supervision of patient - no hands on  Assistive Device None  Distance Ambulated (ft) 10 ft  Activity Response Tolerated well  Mobility Referral Yes  $Mobility charge 1 Mobility  Mobility Specialist Start Time (ACUTE ONLY) 1444  Mobility Specialist Stop Time (ACUTE ONLY) 1455  Mobility Specialist Time Calculation (min) (ACUTE ONLY) 11 min   Mobility Specialist: Progress Note  Pt agreeable to mobility session - received in bed. Required SV with no AD. Pt was asymptomatic throughout session with no complaints. Returned to chair with all needs met - call bell within reach. Family present.   Barnie Mort, BS Mobility Specialist Please contact via SecureChat or Rehab office at [phone].

## 2023-06-26 NOTE — Progress Notes (Signed)
8 Days Post-Op   Subjective/Chief Complaint:  Some drainage around G tube when G tube has been clamped C/o back pain which is chronic pain Getting a little anxious due to extended hospitalization  Nurses at bedside   Objective: Vital signs in last 24 hours: Temp:  [97.4 F (36.3 C)-98.1 F (36.7 C)] (P) 97.7 F (36.5 C) (10/10 1605) Pulse Rate:  [95-105] (P) 105 (10/10 1605) Resp:  [17-20] 17 (10/10 0355) BP: (111-122)/(67-70) (P) 121/71 (10/10 1605) SpO2:  [96 %-97 %] (P) 97 % (10/10 1605) Weight:  [51.7 kg] 51.7 kg (10/10 0356) Last BM Date : 06/25/23  Intake/Output from previous day: 10/09 0701 - 10/10 0700 In: 2686.9 [I.V.:385.3; NG/GT:1551.6; IV Piggyback:420] Out: 550 [Drains:550] Intake/Output this shift: No intake/output data recorded.  Alert, nad, nontoxic Nonlabored G & J tubes ok. Some old drainage around G tube - skin ok Staples intact  Lab Results:  Recent Labs    06/24/23 0341  WBC 9.3  HGB 8.9*  HCT 28.5*  PLT 634*   BMET Recent Labs    06/26/23 0345  NA 134*  K 4.1  CL 99  CO2 27  GLUCOSE 115*  BUN 11  CREATININE 0.44  CALCIUM 8.0*   PT/INR No results for input(s): "LABPROT", "INR" in the last 72 hours. ABG No results for input(s): "PHART", "HCO3" in the last 72 hours.  Invalid input(s): "PCO2", "PO2"  Studies/Results: No results found.  Anti-infectives: Anti-infectives (From admission, onward)    Start     Dose/Rate Route Frequency Ordered Stop   06/18/23 1115  cefoTEtan (CEFOTAN) 2 g in sodium chloride 0.9 % 100 mL IVPB        2 g 200 mL/hr over 30 Minutes Intravenous  Once 06/18/23 1107 06/18/23 1124   06/18/23 1103  sodium chloride 0.9 % with cefoTEtan (CEFOTAN) ADS Med       Note to Pharmacy: Payton Emerald A: cabinet override      06/18/23 1103 06/18/23 1133   06/06/23 0600  cefoTEtan (CEFOTAN) 2 g in sodium chloride 0.9 % 100 mL IVPB        2 g 200 mL/hr over 30 Minutes Intravenous On call to O.R. 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 Andrey Campanile POD #8 s/p Exploratory laparotomy with takedown and subsequent re-placement of jejunostomy tube (red rubber), lysis of adhesions by Dr. Cliffton Asters for sbo at J-J site  - Surgical path negative for H pylori and negative for malignancy - Cont PPI +; stop Carafate  -cont  tylenol/roxicodone with PRN dilaudid for pain. A - Cont scheduled Reglan and PRN anti-emetics.  - Tol TF's @ goal - will change flow rate to try to start cycling TF. If can't handle higher rate, will need to switch to a higher kcal formula -It appears that when we clamp her G-tube she has increased drainage around the gastric tube itself.  So I am not sure if she is emptying her stomach.  Not sure if there is an issue with a gastrojejunostomy.  Will order an upper GI on Friday.  In the interim we will keep G-tube unclamped.  The tube will need to be clamped for the upper GI - Mobilize, PT - no fu recommended  -  J tube resecured at bedside the weekend  of oct 1 - not sure if UGI would be helpful at some point    ID - Cefotetan periop x 2. None currently.  FEN - Work to decrease interval of time for J-tube feeds, unclamp G tube since it appears stomach is not draining VTE - SCDs, Lovenox Foley - out and voiding, some hesitancy, send UA   Anxiety-we will start some Ativan   HTN - PRN meds while NPO Hx Seizures - Home meds  LOS: 20 days    Gaynelle Adu 06/26/2023

## 2023-06-26 NOTE — Plan of Care (Signed)

## 2023-06-27 ENCOUNTER — Inpatient Hospital Stay (HOSPITAL_COMMUNITY): Payer: 59

## 2023-06-27 LAB — GLUCOSE, CAPILLARY
Glucose-Capillary: 106 mg/dL — ABNORMAL HIGH (ref 70–99)
Glucose-Capillary: 110 mg/dL — ABNORMAL HIGH (ref 70–99)
Glucose-Capillary: 122 mg/dL — ABNORMAL HIGH (ref 70–99)
Glucose-Capillary: 122 mg/dL — ABNORMAL HIGH (ref 70–99)

## 2023-06-27 MED ORDER — IOHEXOL 300 MG/ML  SOLN
100.0000 mL | Freq: Once | INTRAMUSCULAR | Status: AC | PRN
  Administered 2023-06-27: 100 mL via ORAL

## 2023-06-27 MED ORDER — ZINC OXIDE 40 % EX OINT: TOPICAL_OINTMENT | Freq: Two times a day (BID) | CUTANEOUS | Status: DC

## 2023-06-27 NOTE — Plan of Care (Signed)

## 2023-06-27 NOTE — Progress Notes (Signed)
   06/27/23 0919  Mobility  Activity Ambulated independently in hallway  Level of Assistance Standby assist, set-up cues, supervision of patient - no hands on  Assistive Device Other (Comment) (IV Pole)  Distance Ambulated (ft) 250 ft  Activity Response Tolerated fair  Mobility Referral Yes  $Mobility charge 1 Mobility  Mobility Specialist Start Time (ACUTE ONLY) 0902  Mobility Specialist Stop Time (ACUTE ONLY) 0917  Mobility Specialist Time Calculation (min) (ACUTE ONLY) 15 min    Mobility Specialist: Progress Note  Pt agreeable to mobility session - received in bed. Required SV throughout using IV Pole. C/o gastric pain rated 8/10 . Returned to bed with all needs met - call bell within reach.   Barnie Mort, BS Mobility Specialist Please contact via SecureChat or Rehab office at [phone].

## 2023-06-27 NOTE — Progress Notes (Signed)
9 Days Post-Op   Subjective/Chief Complaint:  Ativan helped with anxiety Increased G tube output Reports BM Some nausea but controlled with meds  Objective: Vital signs in last 24 hours: Temp:  [97.5 F (36.4 C)-98.1 F (36.7 C)] 97.5 F (36.4 C) (10/11 0556) Pulse Rate:  [93-109] 93 (10/11 0556) Resp:  [18] 18 (10/11 0556) BP: (85-112)/(55-70) 92/62 (10/11 0603) SpO2:  [96 %-98 %] 98 % (10/11 0556) Weight:  [52.1 kg] 52.1 kg (10/11 0557) Last BM Date : 06/26/23  Intake/Output from previous day: 10/10 0701 - 10/11 0700 In: 4584.3 [I.V.:113.3; NG/GT:3561; IV Piggyback:640] Out: 1700 [Drains:1700] Intake/Output this shift: No intake/output data recorded.  Alert, nad, nontoxic Nonlabored G & J tubes ok. Some old drainage around G tube - skin ok Staples intact  Lab Results:  No results for input(s): "WBC", "HGB", "HCT", "PLT" in the last 72 hours.  BMET Recent Labs    06/26/23 0345  NA 134*  K 4.1  CL 99  CO2 27  GLUCOSE 115*  BUN 11  CREATININE 0.44  CALCIUM 8.0*   PT/INR No results for input(s): "LABPROT", "INR" in the last 72 hours. ABG No results for input(s): "PHART", "HCO3" in the last 72 hours.  Invalid input(s): "PCO2", "PO2"  Studies/Results: No results found.  Anti-infectives: Anti-infectives (From admission, onward)    Start     Dose/Rate Route Frequency Ordered Stop   06/18/23 1115  cefoTEtan (CEFOTAN) 2 g in sodium chloride 0.9 % 100 mL IVPB        2 g 200 mL/hr over 30 Minutes Intravenous  Once 06/18/23 1107 06/18/23 1124   06/18/23 1103  sodium chloride 0.9 % with cefoTEtan (CEFOTAN) ADS Med       Note to Pharmacy: Payton Emerald A: cabinet override      06/18/23 1103 06/18/23 1133   06/06/23 0600  cefoTEtan (CEFOTAN) 2 g in sodium chloride 0.9 % 100 mL IVPB        2 g 200 mL/hr over 30 Minutes Intravenous On call to O.R. 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 Andrey Campanile POD #9 s/p Exploratory laparotomy with takedown and subsequent re-placement of jejunostomy tube (red rubber), lysis of adhesions by Dr. Cliffton Asters for sbo at J-J site  - Surgical path negative for H pylori and negative for malignancy - Cont PPI +; stop Carafate  -cont  tylenol/roxicodone with PRN dilaudid for pain. A - Cont scheduled Reglan and PRN anti-emetics.  - Tol TF's @ goal - appears to tolerating higher rate over 16hrs (cyclic). If can't handle higher rate, will need to switch to a higher kcal formula -It appears that when we clamp her G-tube she has increased drainage around the gastric tube itself.  So I am not sure if she is emptying her stomach or  if there is an issue with a gastrojejunostomy.  Will order an upper GI on Friday.  In the interim we will keep G-tube unclamped.  The tube will need to be clamped for the upper GI - Mobilize, PT - no fu recommended  - J tube resecured at bedside the weekend  of oct 1 - not sure if UGI would be helpful at some point    ID - Cefotetan periop  x 2. None currently.  FEN - cont J tube feeds at 21ml/hr x 16hr per day, unclamp G tube since it appears stomach is not draining VTE - SCDs, Lovenox Foley - out and voiding, some hesitancy, send UA   Anxiety-cont prn Ativan   HTN - PRN meds while NPO Hx Seizures - Home meds  DIspo - UGI today to evaluate G-j (ok if radiology if prefer CT with oral/g tube contrast) - want to see if just severe gastroparesis vs issue with g-j.  May need egd next week by GI   LOS: 21 days    Lindsay Frost 06/27/2023

## 2023-06-27 NOTE — Progress Notes (Signed)
   06/27/23 1100  Spiritual Encounters  Type of Visit Initial  Care provided to: Patient  Conversation partners present during encounter Nurse  Referral source Patient request  Reason for visit Routine spiritual support  OnCall Visit No  Spiritual Framework  Presenting Themes Meaning/purpose/sources of inspiration  Values/beliefs faith  Community/Connection Faith community  Needs/Challenges/Barriers isolation, loneliness,  Patient Stress Factors Health changes;Loss  Interventions  Spiritual Care Interventions Made Meaning making;Compassionate presence;Reflective listening;Established relationship of care and support;Normalization of emotions  Intervention Outcomes  Outcomes Reduced isolation;Awareness of support;Connection to spiritual care   Ch responded to request for emotional and spiritual support. There was no family present at bedside. Pt is going through spiritual distress. She said she is depressed because she has been in the hospital for a long time and she does not know when it will end. She feels isolated from friends, family, and faith community. She does have a family member who comes to sleep with her at night, but during the day, she feels lonely. She feels like the community has forgotten about her. Ch asked guided questions to bring forth feelings and built a relationship of care and support. Ch will follow-up with pt.

## 2023-06-28 LAB — GLUCOSE, CAPILLARY
Glucose-Capillary: 121 mg/dL — ABNORMAL HIGH (ref 70–99)
Glucose-Capillary: 106 mg/dL — ABNORMAL HIGH (ref 70–99)
Glucose-Capillary: 101 mg/dL — ABNORMAL HIGH (ref 70–99)
Glucose-Capillary: 108 mg/dL — ABNORMAL HIGH (ref 70–99)

## 2023-06-28 MED ORDER — PROSOURCE TF20 ENFIT COMPATIBL EN LIQD
60.0000 mL | Freq: Every day | ENTERAL | Status: DC
  Administered 2023-06-28 – 2023-07-11 (×14): 60 mL
  Filled 2023-06-28 (×14): qty 60

## 2023-06-28 MED ORDER — OSMOLITE 1.5 CAL PO LIQD
1000.0000 mL | ORAL | Status: DC
  Administered 2023-06-28 – 2023-06-30 (×4): 1000 mL

## 2023-06-28 MED ORDER — BANATROL TF EN LIQD
60.0000 mL | Freq: Two times a day (BID) | ENTERAL | Status: DC
  Administered 2023-06-28 – 2023-07-11 (×26): 60 mL
  Filled 2023-06-28 (×27): qty 60

## 2023-06-28 MED ORDER — FREE WATER
100.0000 mL | Status: DC
  Administered 2023-06-28 – 2023-07-10 (×69): 100 mL

## 2023-06-28 MED ORDER — BANATROL TF EN LIQD: 60.0000 mL | Freq: Three times a day (TID) | ENTERAL | Status: DC

## 2023-06-28 NOTE — Progress Notes (Signed)
Mobility Specialist Progress Note    06/28/23 1518  Mobility  Activity Ambulated with assistance in hallway  Level of Assistance Contact guard assist, steadying assist  Assistive Device Other (Comment) (IV pole, HHA)  Distance Ambulated (ft) 200 ft  Activity Response Tolerated well  Mobility Referral Yes  $Mobility charge 1 Mobility  Mobility Specialist Start Time (ACUTE ONLY) 1504  Mobility Specialist Stop Time (ACUTE ONLY) 1518  Mobility Specialist Time Calculation (min) (ACUTE ONLY) 14 min   Pt received on BSC and agreeable. C/o fatigue. Returned to chair with call bell in reach.   [place] Mobility Specialist  Please Neurosurgeon or Rehab Office at [phone]

## 2023-06-28 NOTE — Progress Notes (Signed)
10 Days Post-Op   Subjective/Chief Complaint: 5 episodes of diarrhea overnight.  Some nausea, apparently night RN did not give scheduled reglan twice during her shift.  This has been discussed and addressed on day shift.    Objective: Vital signs in last 24 hours: Temp:  [98 F (36.7 C)-98.5 F (36.9 C)] 98.1 F (36.7 C) (10/12 0721) Pulse Rate:  [96-110] 109 (10/12 0721) Resp:  [16-20] 16 (10/12 0721) BP: (102-113)/(59-67) 102/59 (10/12 0721) SpO2:  [94 %-98 %] 96 % (10/12 0721) Weight:  [48.8 kg] 48.8 kg (10/12 0500) Last BM Date : 06/26/23  Intake/Output from previous day: 10/11 0701 - 10/12 0700 In: 1416.5 [NG/GT:1036.5; IV Piggyback:320] Out: -  Intake/Output this shift: No intake/output data recorded.  Alert, nad, nontoxic Nonlabored G & J tubes ok. Bilious drainage around both tubes.  G-tube to gravity with copious output.  J-tube with TFs running at 90cc.  Midline wound still with purulent drainage.  PDS loose and free in her wound.  Copious loose fibrin that was cut out.  All staples removed to further open wound due to persistent drainage, but skin has completely healed and this was unable to be finger fractured open. Wound repacked with a dry dressing currently.  Lab Results:  No results for input(s): "WBC", "HGB", "HCT", "PLT" in the last 72 hours.  BMET Recent Labs    06/26/23 0345  NA 134*  K 4.1  CL 99  CO2 27  GLUCOSE 115*  BUN 11  CREATININE 0.44  CALCIUM 8.0*   PT/INR No results for input(s): "LABPROT", "INR" in the last 72 hours. ABG No results for input(s): "PHART", "HCO3" in the last 72 hours.  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 There has been persistent drainage from the gastrostomy site when clamped and request has been made for UGI to evaluate gastric emptying as well as to evaluate the gastro-jejunal limb. EXAM: DG UGI W SINGLE CM TECHNIQUE: Scout radiograph was obtained. Single contrast examination was performed using water soluble contrast. The gastrostomy was clamped and the patient was given contrast orally. This exam was performed by Lynnette Caffey, PA-C, and was supervised and interpreted by Richarda Overlie, MD. FLUOROSCOPY: Radiation Exposure Index (as provided by the fluoroscopic device): 14.60 mGy Kerma COMPARISON:  None Available. FINDINGS: Scout Radiograph: Nonspecific bowel gas pattern. Surgical bowel clips in the abdomen. Evidence for a gastrostomy tube and jejunal feeding tube. Surgical clips in the right upper abdomen. Esophagus: Normal appearance on limited exam. Mild dilatation of the mid and distal esophagus without evidence of stricture or obstruction. Contrast rapidly drains into the stomach. Esophageal motility: Not assessed on limited exam. Gastroesophageal reflux: Not assessed on limited exam. Ingested 13 mm barium tablet:  Not given Stomach: Surgical changes consistent with partial gastrectomy and gastrojejunostomy. Contrast rapidly drains into the jejunum. Gastric emptying: Contrast rapidly drains into the jejunum. Duodenum: Not applicable. Other: The jejunum is dilated distal to the gastrojejunostomy. Contrast and air moves through the jejunum but there is diffuse dilatation of the visualized small bowel loops. There is contrast throughout multiple dilated small bowel loops on the final image. IMPRESSION:  1. Gastrojejunostomy is widely patent. Contrast rapidly moves through the stomach into the small bowel. 2. Dilated loops of small bowel distal to the gastrojejunostomy.  Findings could represent a postoperative ileus. Electronically Signed   By: Richarda Overlie M.D.   On: 06/27/2023 13:02    Anti-infectives: Anti-infectives (From admission, onward)    Start     Dose/Rate Route Frequency Ordered Stop   06/18/23 1115  cefoTEtan (CEFOTAN) 2 g in sodium chloride 0.9 % 100 mL IVPB        2 g 200 mL/hr over 30 Minutes Intravenous  Once 06/18/23 1107 06/18/23 1124   06/18/23 1103  sodium chloride 0.9 % with cefoTEtan (CEFOTAN) ADS Med       Note to Pharmacy: Payton Emerald A: cabinet override      06/18/23 1103 06/18/23 1133   06/06/23 0600  cefoTEtan (CEFOTAN) 2 g in sodium chloride 0.9 % 100 mL IVPB        2 g 200 mL/hr over 30 Minutes Intravenous On call to O.R. 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 Andrey Campanile POD #10 s/p Exploratory laparotomy with takedown and subsequent re-placement of jejunostomy tube (red rubber), lysis of adhesions by Dr. Cliffton Asters for sbo at J-J site  - Surgical path negative for H pylori and negative for malignancy - Cont PPI +; stop Carafate  -cont  tylenol/roxicodone with PRN dilaudid for pain. A - Cont scheduled Reglan and PRN anti-emetics.  - Tol TF's @ goal - appears to tolerating higher rate over 16hrs (cyclic). If can't handle higher rate, will need to switch to a higher kcal formula.  Will do this today given copious diarrhea overnight.  See how this does.  We did discuss that diarrhea can be expected with TFs -continues to have leakage around her g and J tube it appears.  UGI reviewed and GJ wide open.  G-tube tract is dilated so some drainage likely secondary to this and presumed gastroparesis where fluid is just not draining  through her GJ. - Mobilize, PT - no fu recommended  - J tube resecured at bedside the weekend  of oct 1 -wound infection.  Opened 10/11.  Continue dressing changes.  PDS free in wound. -on scheduled reglan   ID - Cefotetan periop x 2. None currently.  FEN - cont J tube feeds at 49ml/hr x 16hr per day, but will ask dietitian to adjust this today to a higher Kcal formula, unclamp G tube since it appears stomach is not draining VTE - SCDs, Lovenox Foley - out and voiding   Anxiety-cont prn Ativan   HTN - PRN meds while NPO Hx Seizures - Home meds  DIspo -  continue current care, may need EGD next week?   LOS: 22 days    Letha Cape 06/28/2023

## 2023-06-28 NOTE — Progress Notes (Signed)
1800- patient's abdominal dressings saturated. RN changed and redressed according to wound orders.

## 2023-06-28 NOTE — Progress Notes (Signed)
Nutrition Follow-up  DOCUMENTATION CODES:   Severe malnutrition in context of chronic illness, Underweight  INTERVENTION:   Plan to trial Osmolite 1.5 formula with addition of Banatrol TF; if diarrhea frequency/consistency does not improve, recommend considering trial of  TF formula (Jevity, Vital, etc)  Tube Feeding via J-tube:  Osmolite 1.5 at 90 ml/hr x 12 hours (6pm to 6am every 24 hours)-Free water flush of 50 mL before and after Add Pro-source TF20 60 mL daily-Free water flush of 50 mL before and after TF regimen provides 1700 kcals, 88 g of protein and 1021 mL of free water  Add Banatrol TF20 BID; each packet provides 5 g of soluble fiber. Free water flush of 50 mL before and after  Change Free water Flush to 100 mL q 4 hours. Recommend administering via syringe during the day when TF not infusing but programming flush into feeding pump during the time from 6pm to 6am that TF infusing.  Plan for additional 50 mL before and after Banatrol TF BID and with Pro-Source administration in addition to 50 mL before starting TF each evening and when discontinuing TF each morning Total free water with TF: 1821 mL This does not include additional free water with other med administrations  Continue liquid vitamin D3 1000 international units daily x 30 days and then recommend re-checking vitamin D level.   Continue liquid multivitamin with minerals BID per tube to help meet micronutrient needs in setting of anatomy and risk for decreased absorption.  If hyponatremia persists/worsens and GI losses high, recommend considering salt administration per tube  NUTRITION DIAGNOSIS:   Severe Malnutrition related to chronic illness (PUD, gastroparesis, esophagitis, duodenal bulb stenosis) as evidenced by severe fat depletion, severe muscle depletion.  Continues but being addressed via TF  GOAL:   Patient will meet greater than or equal to 90% of their needs  Met via TF  MONITOR:   TF  tolerance, Labs, Weight trends  REASON FOR ASSESSMENT:   Consult Enteral/tube feeding initiation and management (resume trickle tube feeds)  ASSESSMENT:   67 yo female admitted with GOO d/t chronic PUD. S/P distal gastrectomy with roux en y gastrojejunostomy and placement of gastric and jejunal feeding tubes 9/20. PMH includes PUD, gastroparesis, duodenal diverticulum, duodenal bulb stenosis, esophagitis, GERD, hiatal hernia, gallstones, meningioma, HTN, seizures, B-12 deficiency.  Dietitian Consult received to assess for possible transition to more calorically dense formula. Pt currently on Osmolite 1.2 at 90 ml/hr x 16 hours via J-tube.   5 episodes of diarrhea overnight. If this continues, recommend considering changing formula to fiber containing Jevity or even consider Vital product. Plan to add soluble fiber per tube today  +some nausea and per Surgery, RN did not given scheduled reglan dose x 2 during shift  Noted mild hyponatremia; if having high GI losses, may need to add salt per tube  Labs: CBGs 96-127, sodium 134 (L), Creatinine wdl Meds: liquid MVI BID, Vit D, reglan    Diet Order:   Diet Order             Diet NPO time specified Except for: Ice Chips, Other (See Comments)  Diet effective now                   EDUCATION NEEDS:   Education needs have been addressed  Skin:  Skin Assessment: Skin Integrity Issues: Skin Integrity Issues:: Incisions Incisions: surgical incision to abdomen  Last BM:  06/25/23 - small type 5  Height:   Ht Readings  from Last 1 Encounters:  06/18/23 5\' 4"  (1.626 m)    Weight:   Wt Readings from Last 1 Encounters:  06/28/23 48.8 kg    Ideal Body Weight:  54.5 kg  BMI:  Body mass index is 18.47 kg/m.  Estimated Nutritional Needs:   Kcal:  1650-1850  Protein:  70-90 gm  Fluid:  1.7-1.9 L   Romelle Starcher MS, RDN, LDN, CNSC Registered Dietitian 3 Clinical Nutrition RD Pager and On-Call Pager Number Located in  [place]

## 2023-06-28 NOTE — Plan of Care (Signed)
  Problem: Safety: Goal: Ability to remain free from injury will improve Outcome: Progressing   Problem: Pain Managment: Goal: General experience of comfort will improve Outcome: Progressing   Problem: Pain Managment: Goal: General experience of comfort will improve Outcome: Progressing   Problem: Safety: Goal: Ability to remain free from injury will improve Outcome: Progressing

## 2023-06-29 ENCOUNTER — Inpatient Hospital Stay (HOSPITAL_COMMUNITY): Payer: 59

## 2023-06-29 LAB — URINALYSIS, ROUTINE W REFLEX MICROSCOPIC
Bilirubin Urine: NEGATIVE
Glucose, UA: NEGATIVE mg/dL
Hgb urine dipstick: NEGATIVE
Ketones, ur: NEGATIVE mg/dL
Leukocytes,Ua: NEGATIVE
Nitrite: NEGATIVE
Protein, ur: NEGATIVE mg/dL
Specific Gravity, Urine: 1.025 (ref 1.005–1.030)
pH: 5 (ref 5.0–8.0)

## 2023-06-29 LAB — GLUCOSE, CAPILLARY
Glucose-Capillary: 128 mg/dL — ABNORMAL HIGH (ref 70–99)
Glucose-Capillary: 114 mg/dL — ABNORMAL HIGH (ref 70–99)
Glucose-Capillary: 88 mg/dL (ref 70–99)
Glucose-Capillary: 95 mg/dL (ref 70–99)

## 2023-06-29 NOTE — Progress Notes (Signed)
11 Days Post-Op   Subjective/Chief Complaint: Diarrhea improved with TF changes yesterday.  Feeling pressure in her abdomen today, feels like she is having trouble starting her stream to void and that she is voiding less than normal.    Objective: Vital signs in last 24 hours: Temp:  [97.5 F (36.4 C)-98.4 F (36.9 C)] 97.5 F (36.4 C) (10/13 0743) Pulse Rate:  [102-107] 107 (10/13 0743) Resp:  [16-18] 16 (10/13 0743) BP: (101-123)/(53-71) 123/64 (10/13 0743) SpO2:  [95 %-96 %] 95 % (10/13 0743) Weight:  [49.8 kg] 49.8 kg (10/13 0500) Last BM Date : 06/28/23  Intake/Output from previous day: 10/12 0701 - 10/13 0700 In: 5128.8 [I.V.:96.3; NG/GT:4012.5; IV Piggyback:960] Out: 750 [Drains:750] Intake/Output this shift: No intake/output data recorded.  Alert, nad, nontoxic Nonlabored G & J tubes ok. Bilious drainage around both tubes, but less today than previous days.  G-tube to gravity with 750cc output.  J-tube with TFs running at 90cc.  Midline wound less drainage today.  PDS loose and free in her wound.  Copious loose fibrin that was cut out Saturday.  All staples removed to further open wound due to persistent drainage, but skin has completely healed and this was unable to be finger fractured open.   Lab Results:  No results for input(s): "WBC", "HGB", "HCT", "PLT" in the last 72 hours.  BMET No results for input(s): "NA", "K", "CL", "CO2", "GLUCOSE", "BUN", "CREATININE", "CALCIUM" in the last 72 hours.  PT/INR No results for input(s): "LABPROT", "INR" in the last 72 hours. ABG No results for input(s): "PHART", "HCO3" in the last 72 hours.  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 There has been persistent drainage from the gastrostomy site when clamped and request has been made for UGI to evaluate gastric emptying as well as to evaluate the gastro-jejunal limb. EXAM: DG UGI W SINGLE CM TECHNIQUE: Scout radiograph was obtained. Single contrast examination was performed using water soluble contrast. The gastrostomy was clamped and the patient was given contrast orally. This exam was performed by Lynnette Caffey, PA-C, and was supervised and interpreted by Richarda Overlie, MD. FLUOROSCOPY: Radiation Exposure Index (as provided by the fluoroscopic device): 14.60 mGy Kerma COMPARISON:  None Available. FINDINGS: Scout Radiograph: Nonspecific bowel gas pattern. Surgical bowel clips in the abdomen. Evidence for a gastrostomy tube and jejunal feeding tube. Surgical clips in the right upper abdomen. Esophagus: Normal appearance on limited exam. Mild dilatation of the mid and distal esophagus without evidence of stricture or obstruction. Contrast rapidly drains into the stomach. Esophageal motility: Not assessed on limited exam. Gastroesophageal reflux: Not assessed on limited exam. Ingested 13 mm barium tablet:  Not given Stomach: Surgical changes consistent with partial gastrectomy and gastrojejunostomy. Contrast rapidly drains into the jejunum. Gastric emptying: Contrast rapidly drains into the jejunum. Duodenum: Not applicable. Other: The jejunum is dilated distal to the gastrojejunostomy. Contrast and air moves through the jejunum but there is diffuse dilatation of the visualized small bowel loops. There is contrast throughout multiple dilated small bowel loops on the final image. IMPRESSION: 1. Gastrojejunostomy is widely patent. Contrast rapidly moves through the stomach into  the small bowel. 2. Dilated loops of small bowel distal to the  gastrojejunostomy. Findings could represent a postoperative ileus. Electronically Signed   By: Richarda Overlie M.D.   On: 06/27/2023 13:02    Anti-infectives: Anti-infectives (From admission, onward)    Start     Dose/Rate Route Frequency Ordered Stop   06/18/23 1115  cefoTEtan (CEFOTAN) 2 g in sodium chloride 0.9 % 100 mL IVPB        2 g 200 mL/hr over 30 Minutes Intravenous  Once 06/18/23 1107 06/18/23 1124   06/18/23 1103  sodium chloride 0.9 % with cefoTEtan (CEFOTAN) ADS Med       Note to Pharmacy: Payton Emerald A: cabinet override      06/18/23 1103 06/18/23 1133   06/06/23 0600  cefoTEtan (CEFOTAN) 2 g in sodium chloride 0.9 % 100 mL IVPB        2 g 200 mL/hr over 30 Minutes Intravenous On call to O.R. 06/06/23 0555 06/06/23 0818       Assessment/Plan: EXPLORATION LAPAROTOMY, INSERTION OF JEJUNAL FEEDING TUBE (N/A) Gastric outlet obstruction due to chronic peptic ulcer disease    POD#23 s/p Exploratory laparotomy, Distal gastrectomy with Roux-en-Y gastrojejunostomy, Placement of 24-French gastric tube, Placement of 18-French jejunostomy tube 9/20 Dr. Andrey Campanile POD #11 s/p Exploratory laparotomy with takedown and subsequent re-placement of jejunostomy tube (red rubber), lysis of adhesions by Dr. Cliffton Asters for sbo at J-J site  - Surgical path negative for H pylori and negative for malignancy - Cont PPI +; stop Carafate  -cont  tylenol/roxicodone with PRN dilaudid for pain. A - Cont scheduled Reglan and PRN anti-emetics.  - Tol TF's @ goal - appears to tolerating higher rate over 16hrs (cyclic).TFs adjusted per dietitian yesterday, but rate still at 90cc -continues to have leakage around her g and J tube it appears, but that seems less today.  UGI reviewed and GJ wide open.  G-tube tract is dilated so some drainage likely secondary to this and presumed gastroparesis where fluid is just not draining through her GJ. - Mobilize, PT - no fu recommended  - J tube resecured at bedside the weekend   of oct 1 -wound infection.  Opened 10/11.  Continue dressing changes.  PDS free in wound. -on scheduled reglan   ID - Cefotetan periop x 2. None currently.  FEN - cont J tube feeds at 59ml/hr x 16hr per day, but will ask dietitian to adjust this today to a higher Kcal formula, unclamp G tube since it appears stomach is not draining VTE - SCDs, Lovenox Foley - out and voiding   Anxiety-cont prn Ativan   HTN - PRN meds while NPO Hx Seizures - Home meds Urinary hesitancy - will bladder scan today to make sure she is not retaining and that is causing her pressure.  Will also send UA.  Unable to order flomax as this can't go down j-tube  DIspo -  continue current care, may need EGD next week?   LOS: 23 days    Letha Cape 06/29/2023

## 2023-06-29 NOTE — Plan of Care (Signed)

## 2023-06-30 LAB — GLUCOSE, CAPILLARY
Glucose-Capillary: 104 mg/dL — ABNORMAL HIGH (ref 70–99)
Glucose-Capillary: 106 mg/dL — ABNORMAL HIGH (ref 70–99)
Glucose-Capillary: 123 mg/dL — ABNORMAL HIGH (ref 70–99)

## 2023-06-30 MED ORDER — OXYBUTYNIN CHLORIDE 5 MG/5ML PO SOLN
2.5000 mg | Freq: Two times a day (BID) | ORAL | Status: DC
  Administered 2023-06-30 – 2023-07-04 (×9): 2.5 mg
  Filled 2023-06-30 (×10): qty 2.5

## 2023-06-30 MED ORDER — LOPERAMIDE HCL 1 MG/7.5ML PO SUSP
2.0000 mg | Freq: Three times a day (TID) | ORAL | Status: DC
  Administered 2023-06-30 (×3): 2 mg via ORAL
  Filled 2023-06-30 (×4): qty 15

## 2023-06-30 NOTE — Progress Notes (Signed)
Occupational Therapy Treatment Patient Details Name: Lindsay Frost MRN: [id number] DOB: Feb 11, 1956 Today's Date: 06/30/2023   History of present illness 67 y.o. 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 10/2 S/P  s/p Exploratory laparotomy with takedown and subsequent re-placement of jejunostomy tube (red rubber), lysis of adhesions. PMH: back pain, cholecystectomy, gastric ulcer, depression.   OT comments  Patient supine in bed, reports in pain but she can try to participate in OT.  Worked on in room ADLs, completing transfers with supervision but functional mobility with min guard for safety and line mgmt.  She requires min guard for toileting and grooming at sink.  Slow moving, guarded due to pain but no losses of balance noted.  Remains limited by pain. Will follow acutely.       If plan is discharge home, recommend the following:  Assistance with cooking/housework;Assist for transportation   Equipment Recommendations  BSC/3in1    Recommendations for Other Services      Precautions / Restrictions Precautions Precautions: Fall Precaution Comments: abdominal surgery, foley, PEG, JG drain, mod fall Required Braces or Orthoses: Other Brace Other Brace: abdominal binder- pt refused 10/14 Restrictions Weight Bearing Restrictions: No       Mobility Bed Mobility Overal bed mobility: Modified Independent             General bed mobility comments: increaed time, cueing for rolling to side before sitting up    Transfers Overall transfer level: Needs assistance   Transfers: Sit to/from Stand Sit to Stand: Supervision           General transfer comment: for safety and line mgmt     Balance Overall balance assessment: Mild deficits observed, not formally tested                                          ADL either performed or assessed with clinical judgement   ADL       Grooming: Wash/dry face;Oral care;Supervision/safety;Standing               Lower Body Dressing: Contact guard assist;Sit to/from stand   Toilet Transfer: Contact guard assist;Ambulation   Toileting- Clothing Manipulation and Hygiene: Contact guard assist;Sit to/from stand       Functional mobility during ADLs: Contact guard assist;Cueing for safety      Extremity/Trunk Assessment              Vision       Perception     Praxis      Cognition Arousal: Alert Behavior During Therapy: WFL for tasks assessed/performed Overall Cognitive Status: Within Functional Limits for tasks assessed                                 General Comments: some slow processing and decreased problem solving but functional        Exercises      Shoulder Instructions       General Comments VSS    Pertinent Vitals/ Pain       Pain Assessment Pain Assessment: Faces Pain Score: 8  Faces Pain Scale: Hurts even more Pain Location: abdomen and back Pain Descriptors / Indicators: Discomfort Pain Intervention(s): Monitored during session,  Limited activity within patient's tolerance, Repositioned, RN gave pain meds during session  Home Living                                          Prior Functioning/Environment              Frequency  Min 1X/week        Progress Toward Goals  OT Goals(current goals can now be found in the care plan section)  Progress towards OT goals: Progressing toward goals  Acute Rehab OT Goals Patient Stated Goal: less pain OT Goal Formulation: With patient Time For Goal Achievement: 07/09/23 Potential to Achieve Goals: Good  Plan      Co-evaluation                 AM-PAC OT "6 Clicks" Daily Activity     Outcome Measure   Help from another person eating meals?: Total (NPO) Help from another  person taking care of personal grooming?: A Little Help from another person toileting, which includes using toliet, bedpan, or urinal?: A Little Help from another person bathing (including washing, rinsing, drying)?: A Little Help from another person to put on and taking off regular upper body clothing?: A Little Help from another person to put on and taking off regular lower body clothing?: A Little 6 Click Score: 16    End of Session    OT Visit Diagnosis: Unsteadiness on feet (R26.81);Muscle weakness (generalized) (M62.81);Other symptoms and signs involving cognitive function   Activity Tolerance Patient tolerated treatment well   Patient Left in bed;with call bell/phone within reach;with nursing/sitter in room   Nurse Communication Mobility status        Time: 1308-6578 OT Time Calculation (min): 20 min  Charges: OT General Charges $OT Visit: 1 Visit OT Treatments $Self Care/Home Management : 8-22 mins  Barry Brunner, OT Acute Rehabilitation Services Office [phone]   Chancy Milroy 06/30/2023, 1:37 PM

## 2023-06-30 NOTE — Progress Notes (Addendum)
Minimal output in drainage bag connected to G tube and pt c/o unrelieved nausea- G tube flushed with 50mL sterile water and emptied immediately into bag. Pt reports nausea is better.

## 2023-06-30 NOTE — Progress Notes (Signed)
Physical Therapy Treatment Patient Details Name: Lindsay Frost MRN: [id number] DOB: 21-Jul-1956 Today's Date: 06/30/2023   History of Present Illness 67 y.o. 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 10/2 S/P  s/p Exploratory laparotomy with takedown and subsequent re-placement of jejunostomy tube (red rubber), lysis of adhesions. PMH: back pain, cholecystectomy, gastric ulcer, depression.    PT Comments  Pt tolerated treatment well today. Pt with similar presentations to previous sessions. No change in DC/DME recs at this time. PT will continue to follow however may sign off next session.    If plan is discharge home, recommend the following: A little help with walking and/or transfers;A little help with bathing/dressing/bathroom;Assistance with cooking/housework;Assist for transportation   Can travel by private vehicle        Equipment Recommendations  BSC/3in1    Recommendations for Other Services       Precautions / Restrictions Precautions Precautions: Fall Precaution Comments: abdominal surgery, foley, PEG, JG drain, mod fall Required Braces or Orthoses: Other Brace Other Brace: abdominal binder- pt refused 10/14 Restrictions Weight Bearing Restrictions: No     Mobility  Bed Mobility Overal bed mobility: Modified Independent             General bed mobility comments: increased time, cueing for rolling to side before sitting up    Transfers Overall transfer level: Needs assistance Equipment used: None Transfers: Sit to/from Stand Sit to Stand: Supervision           General transfer comment: for safety and line mgmt    Ambulation/Gait Ambulation/Gait assistance: Supervision Gait Distance (Feet): 150 Feet Assistive device: IV Pole Gait Pattern/deviations: Step-through  pattern, Decreased step length - right, Decreased step length - left, Narrow base of support, Trunk flexed Gait velocity: decreased     General Gait Details: no LOB. Slow steady gait   Stairs             Wheelchair Mobility     Tilt Bed    Modified Rankin (Stroke Patients Only)       Balance Overall balance assessment: Mild deficits observed, not formally tested                                          Cognition Arousal: Alert Behavior During Therapy: WFL for tasks assessed/performed Overall Cognitive Status: Within Functional Limits for tasks assessed                                 General Comments: some slow processing and decreased problem solving but functional        Exercises      General Comments General comments (skin integrity, edema, etc.): VSS      Pertinent Vitals/Pain Pain Assessment Pain Assessment: Faces Faces Pain Scale: Hurts even more Pain Location: abdomen and back Pain Descriptors / Indicators: Discomfort Pain Intervention(s): Monitored during session    Home Living                          Prior Function            PT Goals (current goals can now be found in the care plan section) Progress towards  PT goals: Progressing toward goals    Frequency    Min 1X/week      PT Plan      Co-evaluation              AM-PAC PT "6 Clicks" Mobility   Outcome Measure  Help needed turning from your back to your side while in a flat bed without using bedrails?: None Help needed moving from lying on your back to sitting on the side of a flat bed without using bedrails?: None Help needed moving to and from a bed to a chair (including a wheelchair)?: A Little Help needed standing up from a chair using your arms (e.g., wheelchair or bedside chair)?: None Help needed to walk in hospital room?: A Little Help needed climbing 3-5 steps with a railing? : A Little 6 Click Score: 21    End  of Session Equipment Utilized During Treatment: Gait belt Activity Tolerance: Patient tolerated treatment well Patient left: in bed;with call bell/phone within reach Nurse Communication: Mobility status PT Visit Diagnosis: Other abnormalities of gait and mobility (R26.89);Muscle weakness (generalized) (M62.81);Pain;Difficulty in walking, not elsewhere classified (R26.2)     Time: 5409-8119 PT Time Calculation (min) (ACUTE ONLY): 9 min  Charges:    $Gait Training: 8-22 mins PT General Charges $$ ACUTE PT VISIT: 1 Visit                     Shela Nevin, PT, DPT Acute Rehab Services [phone]    [name] 06/30/2023, 3:34 PM

## 2023-06-30 NOTE — Plan of Care (Signed)

## 2023-06-30 NOTE — Progress Notes (Signed)
   06/30/23 1118  Mobility  Activity Ambulated independently in hallway  Level of Assistance Standby assist, set-up cues, supervision of patient - no hands on  Assistive Device Other (Comment) (IV Pole)  Distance Ambulated (ft) 150 ft  Activity Response Tolerated fair  Mobility Referral Yes  $Mobility charge 1 Mobility  Mobility Specialist Start Time (ACUTE ONLY) 1105  Mobility Specialist Stop Time (ACUTE ONLY) 1115  Mobility Specialist Time Calculation (min) (ACUTE ONLY) 10 min   Mobility Specialist: Progress Note  Pt agreeable to mobility session - received in bed. Required SV throughout using Iv Pole. C/o gastric pain rated 8/10 . Returned to bed with all needs met - call bell within reach.   Barnie Mort, BS Mobility Specialist Please contact via SecureChat or Rehab office at [phone].

## 2023-06-30 NOTE — Progress Notes (Signed)
12 Days Post-Op   Subjective/Chief Complaint: Continues to have some diarrhea with Tfs. Endorsing persistent bladder pressure. UA negative.  Post void cath with minimal output.      Objective: Vital signs in last 24 hours: Temp:  [97.5 F (36.4 C)-98 F (36.7 C)] 98 F (36.7 C) (10/14 0831) Pulse Rate:  [95-107] 95 (10/14 0831) Resp:  [15-20] 15 (10/14 0831) BP: (94-116)/(55-70) 110/60 (10/14 0831) SpO2:  [94 %-99 %] 96 % (10/14 0831) Weight:  [47.4 kg] 47.4 kg (10/14 0327) Last BM Date : 06/29/23  Intake/Output from previous day: 10/13 0701 - 10/14 0700 In: 320 [IV Piggyback:200] Out: -  Intake/Output this shift: Total I/O In: 2724.1 [NG/GT:1730; IV Piggyback:994.1] Out: -   Alert, nad, nontoxic Nonlabored G-tube to gravity with bilious output.  J-tube with TFs running at 90cc.  Midline wound with minimal fibrinous drainage.  PDS loose and free in her wound.   Lab Results:  No results for input(s): "WBC", "HGB", "HCT", "PLT" in the last 72 hours.  BMET No results for input(s): "NA", "K", "CL", "CO2", "GLUCOSE", "BUN", "CREATININE", "CALCIUM" in the last 72 hours.  PT/INR No results for input(s): "LABPROT", "INR" in the last 72 hours. ABG No results for input(s): "PHART", "HCO3" in the last 72 hours.  Invalid input(s): "PCO2", "PO2"  Studies/Results: DG Abd Portable 1V  Result Date: 06/29/2023 CLINICAL DATA:  Shortness of breath. EXAM: PORTABLE ABDOMEN - 1 VIEW COMPARISON:  Upper GI 06/27/2023. CT of the abdomen and pelvis 06/17/2023. FINDINGS: Gastrostomy and jejunostomy tubes stable. Faint contrast remains within dilated loops of small bowel in the anatomic pelvis. No proximal obstruction is present. Colonic gas is noted. IMPRESSION: Faint contrast remains within dilated loops of small bowel in the anatomic pelvis compatible with ileus. No proximal obstruction. Electronically Signed   By: Marin Roberts M.D.   On: 06/29/2023 14:21     Anti-infectives: Anti-infectives (From admission, onward)    Start     Dose/Rate Route Frequency Ordered Stop   06/18/23 1115  cefoTEtan (CEFOTAN) 2 g in sodium chloride 0.9 % 100 mL IVPB        2 g 200 mL/hr over 30 Minutes Intravenous  Once 06/18/23 1107 06/18/23 1124   06/18/23 1103  sodium chloride 0.9 % with cefoTEtan (CEFOTAN) ADS Med       Note to Pharmacy: Payton Emerald A: cabinet override      06/18/23 1103 06/18/23 1133   06/06/23 0600  cefoTEtan (CEFOTAN) 2 g in sodium chloride 0.9 % 100 mL IVPB        2 g 200 mL/hr over 30 Minutes Intravenous On call to O.R. 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 Andrey Campanile POD #12 s/p Exploratory laparotomy with takedown and subsequent re-placement of jejunostomy tube (red rubber), lysis of adhesions by Dr. Cliffton Asters for sbo at J-J site  - Surgical path negative for H pylori and negative for malignancy - Cont PPI + -cont  tylenol/roxicodone with PRN dilaudid for pain. A - Cont scheduled Reglan and PRN anti-emetics.  - Tol TF's @ goal - appears to tolerating higher rate over 16hrs (cyclic).TFs adjustments per diet team -Monitor leakage from around the G and J-tubes.  Appears minimal this morning and UGI shows patent  anastomosis. Will attempt clamping of the G-tube again sometime this week - Mobilize, PT - no fu recommended  - J tube resecured at bedside the weekend  of oct 1 -wound infection.  Opened 10/11.  Continue dressing changes.  PDS free in wound. -on scheduled reglan   ID - Cefotetan periop x 2. None currently.  FEN - cont J tube feeds at 14ml/hr x 16hr per day, adjustments per dietician.  G-tube to straight drain but will  trial clamping this week Foley - out and voiding   Anxiety-cont prn Ativan   HTN - PRN meds while NPO Hx Seizures - Home meds Urinary hesitancy - UA negative.  Will trial oxybutynin  DIspo -  continue current care, may need EGD next week?   LOS: 24 days    Moise Boring 06/30/2023

## 2023-06-30 NOTE — Plan of Care (Signed)
Problem: Pain Managment: Goal: General experience of comfort will improve Outcome: Progressing   Problem: Safety: Goal: Ability to remain free from injury will improve Outcome: Progressing   Problem: Pain Managment: Goal: General experience of comfort will improve Outcome: Progressing

## 2023-07-01 LAB — GLUCOSE, CAPILLARY
Glucose-Capillary: 111 mg/dL — ABNORMAL HIGH (ref 70–99)
Glucose-Capillary: 112 mg/dL — ABNORMAL HIGH (ref 70–99)
Glucose-Capillary: 134 mg/dL — ABNORMAL HIGH (ref 70–99)
Glucose-Capillary: 88 mg/dL (ref 70–99)

## 2023-07-01 MED ORDER — JEVITY 1.5 CAL/FIBER PO LIQD
1080.0000 mL | ORAL | Status: DC
  Administered 2023-07-01 – 2023-07-10 (×11): 1080 mL
  Filled 2023-07-01: qty 1185
  Filled 2023-07-01: qty 2000
  Filled 2023-07-01 (×2): qty 1185
  Filled 2023-07-01: qty 2000
  Filled 2023-07-01: qty 1185
  Filled 2023-07-01 (×2): qty 2000
  Filled 2023-07-01 (×2): qty 1185
  Filled 2023-07-01: qty 2000
  Filled 2023-07-01 (×2): qty 1185
  Filled 2023-07-01: qty 2000

## 2023-07-01 MED ORDER — PROCHLORPERAZINE EDISYLATE 10 MG/2ML IJ SOLN
10.0000 mg | Freq: Four times a day (QID) | INTRAMUSCULAR | Status: DC | PRN
  Administered 2023-07-06 – 2023-07-09 (×2): 10 mg via INTRAVENOUS
  Filled 2023-07-01 (×2): qty 2

## 2023-07-01 MED ORDER — HYDROMORPHONE HCL 1 MG/ML IJ SOLN
0.5000 mg | INTRAMUSCULAR | Status: DC | PRN
  Administered 2023-07-01 – 2023-07-09 (×32): 1 mg via INTRAVENOUS
  Filled 2023-07-01 (×32): qty 1

## 2023-07-01 NOTE — Progress Notes (Signed)
Nutrition Brief Note  Secure chat received from Surgery regarding ability to adjust TF regimen d/t ongoing diarrhea. MD reports frequency has improved with addition of banatrol and imodium over the weekend however still continues with loose stools. Unable to confirm based on flowsheet review. Will transition to a fiber containing formula.     Current TF regimen: Osmolite 1.5 at 90ml x12 hours (6p-6a).  New TF regimen: Jevity 1.5 at 90ml x12 hours (6p-6a) 60ml ProSouce TF once daily  Provides 1700 kcal, 89g protein and free water daily  Venting G tube: output x24 hours  Drusilla Kanner, RDN, LDN Clinical Nutrition

## 2023-07-01 NOTE — Progress Notes (Signed)
13 Days Post-Op   Subjective/Chief Complaint: Continues to have some diarrhea with Tf but improved. bladder pressure much better.  UA negative.   Had a lot of nausea yesterday and nurse flushed G tube and got about 2L of gastric output immediately yesterday.  Friend at [facility]  Objective: Vital signs in last 24 hours: Temp:  [97.7 F (36.5 C)-98 F (36.7 C)] 97.7 F (36.5 C) (10/15 0552) Pulse Rate:  [95-99] 97 (10/15 0552) Resp:  [15-18] 18 (10/15 0552) BP: (98-110)/(60-70) 103/65 (10/15 0552) SpO2:  [96 %-98 %] 97 % (10/15 0552) Last BM Date : 06/29/23  Intake/Output from previous day: 10/14 0701 - 10/15 0700 In: 3359.3 [NG/GT:1830; IV Piggyback:1179.3] Out: 1975 [Drains:1975] Intake/Output this shift: No intake/output data recorded.  Alert, nad, nontoxic Nonlabored G-tube to gravity with bilious output.  .  Midline wound with minimal fibrinous drainage.  PDS loose and free in her wound.   Lab Results:  No results for input(s): "WBC", "HGB", "HCT", "PLT" in the last 72 hours.  BMET No results for input(s): "NA", "K", "CL", "CO2", "GLUCOSE", "BUN", "CREATININE", "CALCIUM" in the last 72 hours.  PT/INR No results for input(s): "LABPROT", "INR" in the last 72 hours. ABG No results for input(s): "PHART", "HCO3" in the last 72 hours.  Invalid input(s): "PCO2", "PO2"  Studies/Results: DG Abd Portable 1V  Result Date: 06/29/2023 CLINICAL DATA:  Shortness of breath. EXAM: PORTABLE ABDOMEN - 1 VIEW COMPARISON:  Upper GI 06/27/2023. CT of the abdomen and pelvis 06/17/2023. FINDINGS: Gastrostomy and jejunostomy tubes stable. Faint contrast remains within dilated loops of small bowel in the anatomic pelvis. No proximal obstruction is present. Colonic gas is noted. IMPRESSION: Faint contrast remains within dilated loops of small bowel in the anatomic pelvis compatible with ileus. No proximal obstruction. Electronically Signed   By: Marin Roberts M.D.   On: 06/29/2023 14:21     Anti-infectives: Anti-infectives (From admission, onward)    Start     Dose/Rate Route Frequency Ordered Stop   06/18/23 1115  cefoTEtan (CEFOTAN) 2 g in sodium chloride 0.9 % 100 mL IVPB        2 g 200 mL/hr over 30 Minutes Intravenous  Once 06/18/23 1107 06/18/23 1124   06/18/23 1103  sodium chloride 0.9 % with cefoTEtan (CEFOTAN) ADS Med       Note to Pharmacy: Payton Emerald A: cabinet override      06/18/23 1103 06/18/23 1133   06/06/23 0600  cefoTEtan (CEFOTAN) 2 g in sodium chloride 0.9 % 100 mL IVPB        2 g 200 mL/hr over 30 Minutes Intravenous On call to O.R. 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 Andrey Campanile POD #13 s/p Exploratory laparotomy with takedown and subsequent re-placement of jejunostomy tube (red rubber), lysis of adhesions by Dr. Cliffton Asters for sbo at J-J site  - Surgical path negative for H pylori and negative for malignancy - Cont PPI + -cont  tylenol/roxicodone with PRN dilaudid for pain. A - Cont scheduled Reglan and PRN anti-emetics.  - Tol TF's @ goal - appears to tolerating higher rate over 16hrs (cyclic). However having loose stools, will try different TFs formula adjustments per diet team; G tube output significantly increased - ?correlation with  starting imodium? -Monitor leakage from around the G and J-tubes.  UGI shows patent anastomosis. Will attempt clamping of the G-tube again sometime this week - Mobilize, PT - no fu recommended  - J tube resecured at bedside the weekend  of oct 1 -wound infection.  Opened 10/11.  Continue dressing changes.  PDS free in wound. -on scheduled reglan   ID - Cefotetan periop x 2. None currently.  FEN - cont  J tube feeds at 81ml/hr x 16hr per day, adjustments per dietician.  G-tube to straight drain but will trial clamping this week Foley - out and voiding   Anxiety-cont prn Ativan   HTN - PRN meds while NPO Hx Seizures - Home meds Urinary hesitancy - UA negative.  Will trial oxybutynin  DIspo - stop imodium, trial different J tube TF formula for cyclic TF; cut back on dilaudid; reviewed new anatomy with pt (printed pt of her new anatomy)    LOS: 25 days    Gaynelle Adu 07/01/2023

## 2023-07-01 NOTE — Progress Notes (Signed)
   07/01/23 1624  Mobility  Activity Ambulated independently in hallway  Level of Assistance Standby assist, set-up cues, supervision of patient - no hands on  Assistive Device Other (Comment) (IV Pole)  Distance Ambulated (ft) 150 ft  Activity Response Tolerated well  Mobility Referral Yes  $Mobility charge 1 Mobility  Mobility Specialist Start Time (ACUTE ONLY) 1549  Mobility Specialist Stop Time (ACUTE ONLY) 1556  Mobility Specialist Time Calculation (min) (ACUTE ONLY) 7 min   Mobility Specialist: Progress Note  Pt agreeable to mobility session - received in bed. Required SV using IV Pole. Pt was asymptomatic throughout session with no complaints. Returned to bed with all needs met - call bell within reach. Daughter present.  Barnie Mort, BS Mobility Specialist Please contact via SecureChat or Rehab office at [phone].

## 2023-07-01 NOTE — Plan of Care (Signed)
Progressing

## 2023-07-01 NOTE — Plan of Care (Signed)

## 2023-07-02 ENCOUNTER — Ambulatory Visit: Payer: 59 | Admitting: Family

## 2023-07-02 LAB — GLUCOSE, CAPILLARY
Glucose-Capillary: 104 mg/dL — ABNORMAL HIGH (ref 70–99)
Glucose-Capillary: 117 mg/dL — ABNORMAL HIGH (ref 70–99)
Glucose-Capillary: 117 mg/dL — ABNORMAL HIGH (ref 70–99)
Glucose-Capillary: 131 mg/dL — ABNORMAL HIGH (ref 70–99)
Glucose-Capillary: 84 mg/dL (ref 70–99)
Glucose-Capillary: 91 mg/dL (ref 70–99)

## 2023-07-02 LAB — BASIC METABOLIC PANEL
Anion gap: 9 (ref 5–15)
BUN: 19 mg/dL (ref 8–23)
CO2: 26 mmol/L (ref 22–32)
Calcium: 7.9 mg/dL — ABNORMAL LOW (ref 8.9–10.3)
Chloride: 97 mmol/L — ABNORMAL LOW (ref 98–111)
Creatinine, Ser: 0.49 mg/dL (ref 0.44–1.00)
GFR, Estimated: >60 mL/min (ref >60)
Glucose, Bld: 110 mg/dL — ABNORMAL HIGH (ref 70–99)
Potassium: 3.7 mmol/L (ref 3.5–5.1)
Sodium: 132 mmol/L — ABNORMAL LOW (ref 135–145)

## 2023-07-02 LAB — MAGNESIUM: Magnesium: 1.8 mg/dL (ref 1.7–2.4)

## 2023-07-02 NOTE — Progress Notes (Signed)
   07/02/23 2047  Spiritual Encounters  Type of Visit Follow up  Care provided to: Patient  Referral source Chaplain team  Reason for visit Routine spiritual support  OnCall Visit Yes  Spiritual Framework  Presenting Themes Coping tools;Community and relationships  Patient Stress Factors Exhausted   The patient has been in the hospital for over a month and is tired of being hospitalized.  She knows she isn't ready to go home but wants desperately to go home. I talked with her about her frustration about not getting her pain meds in a timely manner.  We talked about how her spiritual life gives her strength to go forward.  I asked what the patient wanted to pray for. She asked me to pray for a miracle so she could go home, for faster nursing care, for her family to come see her, and to give thanks for those who do come to see her. I prayed for each request.  Chaplain Shanese Riemenschneider Lile-King

## 2023-07-02 NOTE — Progress Notes (Signed)
Occupational Therapy Treatment Patient Details Name: Lindsay Frost MRN: [id number] DOB: 10/23/1955 Today's Date: 07/02/2023   History of present illness 67 y.o. 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 10/2 S/P  s/p Exploratory laparotomy with takedown and subsequent re-placement of jejunostomy tube (red rubber), lysis of adhesions. PMH: back pain, cholecystectomy, gastric ulcer, depression.   OT comments  Patient progressing well, remains limited by pain and generalized weakness.  Noted improved stability today.  Requires supervision for mobility, transfers and Adls for line mgmt.  Intermittent cueing for abdominal protection and safety.  Initiated energy conservation education and discussed OT goals.  Plan for UE strengthening exercises next session.  Will follow acutely.       If plan is discharge home, recommend the following:  Assistance with cooking/housework;Assist for transportation   Equipment Recommendations  BSC/3in1    Recommendations for Other Services      Precautions / Restrictions Precautions Precautions: Fall Precaution Comments: abdominal surgery, foley, PEG, JG drain, mod fall Required Braces or Orthoses: Other Brace Other Brace: abdominal binder- pt refused 10/16 Restrictions Weight Bearing Restrictions: No       Mobility Bed Mobility Overal bed mobility: Needs Assistance Bed Mobility: Rolling, Sidelying to Sit, Sit to Sidelying Rolling: Supervision Sidelying to sit: Supervision     Sit to sidelying: Supervision General bed mobility comments: cueing to roll to protect abdomen    Transfers Overall transfer level: Needs assistance Equipment used: None Transfers: Sit to/from Stand Sit to Stand: Supervision           General transfer comment: for lines and safety      Balance Overall balance assessment: Mild deficits observed, not formally tested                                         ADL either performed or assessed with clinical judgement   ADL Overall ADL's : Needs assistance/impaired     Grooming: Wash/dry face;Oral care;Supervision/safety;Standing               Lower Body Dressing: Supervision/safety;Sit to/from stand   Toilet Transfer: Supervision/safety;Ambulation           Functional mobility during ADLs: Supervision/safety General ADL Comments: line mgmt, initated energy conservation training    Extremity/Trunk Assessment              Vision       Perception     Praxis      Cognition Arousal: Alert Behavior During Therapy: WFL for tasks assessed/performed Overall Cognitive Status: Within Functional Limits for tasks assessed                                 General Comments: some slow processing and decreased problem solving but functional        Exercises      Shoulder Instructions       General Comments VSS, energy conservation education provided- continue next session    Pertinent Vitals/ Pain       Pain Assessment Pain Assessment: Faces Faces Pain Scale: Hurts even more Pain Location: abdomen and back Pain Descriptors / Indicators: Discomfort Pain Intervention(s): Limited activity within patient's tolerance, Monitored during session, Repositioned  Home Living  Prior Functioning/Environment              Frequency  Min 1X/week        Progress Toward Goals  OT Goals(current goals can now be found in the care plan section)  Progress towards OT goals: Progressing toward goals  Acute Rehab OT Goals Patient Stated Goal: less pain OT Goal Formulation: With patient Time For Goal Achievement: 07/09/23 Potential to Achieve Goals: Good  Plan      Co-evaluation                  AM-PAC OT "6 Clicks" Daily Activity     Outcome Measure   Help from another person eating meals?: Total (NPO) Help from another person taking care of personal grooming?: A Little Help from another person toileting, which includes using toliet, bedpan, or urinal?: A Little Help from another person bathing (including washing, rinsing, drying)?: A Little Help from another person to put on and taking off regular upper body clothing?: A Little Help from another person to put on and taking off regular lower body clothing?: A Little 6 Click Score: 16    End of Session    OT Visit Diagnosis: Unsteadiness on feet (R26.81);Muscle weakness (generalized) (M62.81);Other symptoms and signs involving cognitive function   Activity Tolerance Patient tolerated treatment well   Patient Left in bed;with call bell/phone within reach   Nurse Communication Mobility status        Time: 6962-9528 OT Time Calculation (min): 22 min  Charges: OT General Charges $OT Visit: 1 Visit OT Treatments $Self Care/Home Management : 8-22 mins  Barry Brunner, OT Acute Rehabilitation Services Office [phone]   Lindsay Frost 07/02/2023, 12:24 PM

## 2023-07-02 NOTE — Progress Notes (Signed)
   07/02/23 1531  Mobility  Activity Ambulated independently in hallway  Level of Assistance Standby assist, set-up cues, supervision of patient - no hands on  Assistive Device Other (Comment) (IV Pole)  Distance Ambulated (ft) 250 ft  Activity Response Tolerated well  Mobility Referral Yes  $Mobility charge 1 Mobility  Mobility Specialist Start Time (ACUTE ONLY) 0320  Mobility Specialist Stop Time (ACUTE ONLY) 0330  Mobility Specialist Time Calculation (min) (ACUTE ONLY) 10 min   Mobility Specialist: Progress Note  Pt agreeable to mobility session - received in bed. Required SV using IV Pole. Pt was asymptomatic throughout session with no complaints. Returned to EOB with all needs met - call bell within reach. RN present.   Barnie Mort, BS Mobility Specialist Please contact via SecureChat or Rehab office at [phone].

## 2023-07-02 NOTE — Progress Notes (Signed)
14 Days Post-Op   Subjective/Chief Complaint: Friend at [facility].  Had diarrhea overnight but started out watery, but has now started to become more pudding like.  Thinks this TFs formula may be working better.  G-tube output down to 850cc yesterday and less leakage around her tubes.  Objective: Vital signs in last 24 hours: Temp:  [97.6 F (36.4 C)-98.8 F (37.1 C)] 97.6 F (36.4 C) (10/16 0804) Pulse Rate:  [95-99] 99 (10/16 0804) Resp:  [15-22] 17 (10/16 0804) BP: (105-110)/(60-72) 108/60 (10/16 0804) SpO2:  [98 %-100 %] 99 % (10/16 0804) Weight:  [46.2 kg] 46.2 kg (10/16 0454) Last BM Date : 07/01/23  Intake/Output from previous day: 10/15 0701 - 10/16 0700 In: 210  Out: 850 [Drains:850] Intake/Output this shift: No intake/output data recorded.  Alert, nad, nontoxic Nonlabored G-tube to gravity with bilious output.  .  Midline wound with minimal fibrinous drainage.  PDS loose and free in her wound. Looks much better.  G/J tube in place and dressings just changed so minimal leakage.  Lab Results:  No results for input(s): "WBC", "HGB", "HCT", "PLT" in the last 72 hours.  BMET Recent Labs    07/02/23 0305  NA 132*  K 3.7  CL 97*  CO2 26  GLUCOSE 110*  BUN 19  CREATININE 0.49  CALCIUM 7.9*    PT/INR No results for input(s): "LABPROT", "INR" in the last 72 hours. ABG No results for input(s): "PHART", "HCO3" in the last 72 hours.  Invalid input(s): "PCO2", "PO2"  Studies/Results: No results found.  Anti-infectives: Anti-infectives (From admission, onward)    Start     Dose/Rate Route Frequency Ordered Stop   06/18/23 1115  cefoTEtan (CEFOTAN) 2 g in sodium chloride 0.9 % 100 mL IVPB        2 g 200 mL/hr over 30 Minutes Intravenous  Once 06/18/23 1107 06/18/23 1124   06/18/23 1103  sodium chloride 0.9 % with cefoTEtan (CEFOTAN) ADS Med       Note to Pharmacy: Payton Emerald A: cabinet override      06/18/23 1103 06/18/23 1133   06/06/23 0600  cefoTEtan  (CEFOTAN) 2 g in sodium chloride 0.9 % 100 mL IVPB        2 g 200 mL/hr over 30 Minutes Intravenous On call to O.R. 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 Andrey Campanile POD #14 s/p Exploratory laparotomy with takedown and subsequent re-placement of jejunostomy tube (red rubber), lysis of adhesions by Dr. Cliffton Asters for sbo at J-J site  - Surgical path negative for H pylori and negative for malignancy - Cont PPI + -cont  tylenol/roxicodone with PRN dilaudid for pain. A - Cont scheduled Reglan and PRN anti-emetics.  - Tol TF's @ goal - appears to tolerating higher rate over 16hrs (cyclic). New TF formula being tolerated better.  G-tube output back down to 850/24hrs period. -Monitor leakage from around the G and J-tubes.  UGI shows patent anastomosis. Will attempt clamping of the G-tube again sometime this week - Mobilize, PT - no fu recommended  - J tube resecured at bedside the weekend  of oct 1 -wound infection.  Opened 10/11.  Continue dressing changes.  PDS free in wound. -on scheduled reglan   ID -  Cefotetan periop x 2. None currently.  FEN - cont J tube feeds at 13ml/hr x 16hr per day, adjustments per dietician.  G-tube to straight drain but will trial clamping this week Foley - out and voiding   Anxiety-cont prn Ativan   HTN - PRN meds while NPO Hx Seizures - Home meds Urinary hesitancy - UA negative.  Will trial oxybutynin  DIspo - doing better on new j-tube feed formula.  ? Reattempt g-tube clamping soon.   LOS: 26 days    Letha Cape 07/02/2023

## 2023-07-02 NOTE — Plan of Care (Signed)
  Problem: Education: Goal: Knowledge of General Education information will improve Description: Including pain rating scale, medication(s)/side effects and non-pharmacologic comfort measures Outcome: Progressing   Problem: Clinical Measurements: Goal: Respiratory complications will improve Outcome: Progressing   Problem: Clinical Measurements: Goal: Cardiovascular complication will be avoided Outcome: Progressing   Problem: Nutrition: Goal: Adequate nutrition will be maintained Outcome: Progressing   Problem: Coping: Goal: Level of anxiety will decrease Outcome: Progressing   Problem: Pain Managment: Goal: General experience of comfort will improve Outcome: Not Progressing   Problem: Safety: Goal: Ability to remain free from injury will improve Outcome: Progressing   Problem: Skin Integrity: Goal: Risk for impaired skin integrity will decrease Outcome: Progressing

## 2023-07-02 NOTE — Plan of Care (Signed)

## 2023-07-02 NOTE — Progress Notes (Signed)
   07/02/23 1158  Mobility  Activity Ambulated independently in hallway  Level of Assistance Standby assist, set-up cues, supervision of patient - no hands on  Assistive Device Other (Comment) (IV Pole)  Distance Ambulated (ft) 250 ft  Activity Response Tolerated well  Mobility Referral Yes  $Mobility charge 1 Mobility  Mobility Specialist Start Time (ACUTE ONLY) 1123  Mobility Specialist Stop Time (ACUTE ONLY) 1133  Mobility Specialist Time Calculation (min) (ACUTE ONLY) 10 min   Mobility Specialist: Progress Note  Pt agreeable to mobility session - received in bed. Required SV using IV Pole. Pt was asymptomatic throughout session with no complaints. Returned to bed with all needs met - call bell within reach.   Barnie Mort, BS Mobility Specialist Please contact via SecureChat or Rehab office at [phone].

## 2023-07-03 LAB — GLUCOSE, CAPILLARY
Glucose-Capillary: 118 mg/dL — ABNORMAL HIGH (ref 70–99)
Glucose-Capillary: 161 mg/dL — ABNORMAL HIGH (ref 70–99)
Glucose-Capillary: 96 mg/dL (ref 70–99)
Glucose-Capillary: 96 mg/dL (ref 70–99)

## 2023-07-03 MED ORDER — TAMSULOSIN HCL 0.4 MG PO CAPS
0.4000 mg | ORAL_CAPSULE | Freq: Every day | ORAL | Status: DC
  Administered 2023-07-03 – 2023-07-11 (×9): 0.4 mg via ORAL
  Filled 2023-07-03 (×9): qty 1

## 2023-07-03 NOTE — Progress Notes (Signed)
G tube clamped.

## 2023-07-03 NOTE — Plan of Care (Signed)
  Problem: Education: Goal: Knowledge of General Education information will improve Description: Including pain rating scale, medication(s)/side effects and non-pharmacologic comfort measures 07/03/2023 1724 by Myrle Sheng, RN Outcome: Progressing 07/03/2023 1724 by Myrle Sheng, RN Outcome: Progressing   Problem: Health Behavior/Discharge Planning: Goal: Ability to manage health-related needs will improve 07/03/2023 1724 by Myrle Sheng, RN Outcome: Progressing 07/03/2023 1724 by Myrle Sheng, RN Outcome: Progressing   Problem: Clinical Measurements: Goal: Ability to maintain clinical measurements within normal limits will improve 07/03/2023 1724 by Myrle Sheng, RN Outcome: Progressing 07/03/2023 1724 by Myrle Sheng, RN Outcome: Progressing Goal: Will remain free from infection 07/03/2023 1724 by Myrle Sheng, RN Outcome: Progressing 07/03/2023 1724 by Myrle Sheng, RN Outcome: Progressing Goal: Diagnostic test results will improve 07/03/2023 1724 by Myrle Sheng, RN Outcome: Progressing 07/03/2023 1724 by Myrle Sheng, RN Outcome: Progressing Goal: Respiratory complications will improve 07/03/2023 1724 by Myrle Sheng, RN Outcome: Progressing 07/03/2023 1724 by Myrle Sheng, RN Outcome: Progressing Goal: Cardiovascular complication will be avoided 07/03/2023 1724 by Myrle Sheng, RN Outcome: Progressing 07/03/2023 1724 by Myrle Sheng, RN Outcome: Progressing   Problem: Activity: Goal: Risk for activity intolerance will decrease 07/03/2023 1724 by Myrle Sheng, RN Outcome: Progressing 07/03/2023 1724 by Myrle Sheng, RN Outcome: Progressing   Problem: Nutrition: Goal: Adequate nutrition will be maintained 07/03/2023 1724 by Myrle Sheng, RN Outcome: Progressing 07/03/2023 1724 by Myrle Sheng, RN Outcome: Progressing   Problem: Coping: Goal: Level  of anxiety will decrease 07/03/2023 1724 by Myrle Sheng, RN Outcome: Progressing 07/03/2023 1724 by Myrle Sheng, RN Outcome: Progressing   Problem: Elimination: Goal: Will not experience complications related to bowel motility 07/03/2023 1724 by Myrle Sheng, RN Outcome: Progressing 07/03/2023 1724 by Myrle Sheng, RN Outcome: Progressing Goal: Will not experience complications related to urinary retention 07/03/2023 1724 by Myrle Sheng, RN Outcome: Progressing 07/03/2023 1724 by Myrle Sheng, RN Outcome: Progressing   Problem: Pain Managment: Goal: General experience of comfort will improve 07/03/2023 1724 by Myrle Sheng, RN Outcome: Progressing 07/03/2023 1724 by Myrle Sheng, RN Outcome: Progressing   Problem: Safety: Goal: Ability to remain free from injury will improve 07/03/2023 1724 by Myrle Sheng, RN Outcome: Progressing 07/03/2023 1724 by Myrle Sheng, RN Outcome: Progressing   Problem: Skin Integrity: Goal: Risk for impaired skin integrity will decrease 07/03/2023 1724 by Myrle Sheng, RN Outcome: Progressing 07/03/2023 1724 by Myrle Sheng, RN Outcome: Progressing   Problem: Education: Goal: Understanding of post-operative needs will improve 07/03/2023 1724 by Myrle Sheng, RN Outcome: Progressing 07/03/2023 1724 by Myrle Sheng, RN Outcome: Progressing Goal: Individualized Educational Video(s) 07/03/2023 1724 by Myrle Sheng, RN Outcome: Progressing 07/03/2023 1724 by Myrle Sheng, RN Outcome: Progressing   Problem: Clinical Measurements: Goal: Postoperative complications will be avoided or minimized 07/03/2023 1724 by Myrle Sheng, RN Outcome: Progressing 07/03/2023 1724 by Myrle Sheng, RN Outcome: Progressing   Problem: Respiratory: Goal: Will regain and/or maintain adequate ventilation 07/03/2023 1724 by Myrle Sheng,  RN Outcome: Progressing 07/03/2023 1724 by Myrle Sheng, RN Outcome: Progressing

## 2023-07-03 NOTE — Progress Notes (Addendum)
G tube reclamped. 60 mls of bile looking drainage emptied.

## 2023-07-03 NOTE — Progress Notes (Signed)
Physical Therapy Treatment Patient Details Name: Lindsay Frost MRN: [id number] DOB: March 24, 1956 Today's Date: 07/03/2023   History of Present Illness 67 y.o. 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 10/2 S/P  s/p Exploratory laparotomy with takedown and subsequent re-placement of jejunostomy tube (red rubber), lysis of adhesions. PMH: back pain, cholecystectomy, gastric ulcer, depression.    PT Comments  Pt tolerated treatment well today. Pt with similar presentation to previous session. No change in DC/DME recs at this time. PT will continue to follow.    If plan is discharge home, recommend the following: A little help with walking and/or transfers;A little help with bathing/dressing/bathroom;Assistance with cooking/housework;Assist for transportation   Can travel by private vehicle        Equipment Recommendations  BSC/3in1    Recommendations for Other Services       Precautions / Restrictions Precautions Precautions: Fall Precaution Comments: abdominal surgery, foley, PEG, JG drain, mod fall Required Braces or Orthoses: Other Brace Other Brace: abdominal binder Restrictions Weight Bearing Restrictions: No     Mobility  Bed Mobility Overal bed mobility: Needs Assistance Bed Mobility: Supine to Sit     Supine to sit: Supervision, Used rails, HOB elevated          Transfers Overall transfer level: Needs assistance Equipment used: None Transfers: Sit to/from Stand Sit to Stand: Supervision           General transfer comment: for lines and safety    Ambulation/Gait Ambulation/Gait assistance: Supervision Gait Distance (Feet): 250 Feet Assistive device: IV Pole Gait Pattern/deviations: Step-through pattern, Decreased step length - right, Decreased step length - left, Narrow  base of support, Trunk flexed Gait velocity: decreased     General Gait Details: no LOB. Slow steady gait   Stairs             Wheelchair Mobility     Tilt Bed    Modified Rankin (Stroke Patients Only)       Balance Overall balance assessment: Mild deficits observed, not formally tested                                          Cognition Arousal: Alert Behavior During Therapy: WFL for tasks assessed/performed Overall Cognitive Status: Within Functional Limits for tasks assessed                                 General Comments: some slow processing and decreased problem solving but functional        Exercises      General Comments General comments (skin integrity, edema, etc.): VSS      Pertinent Vitals/Pain Pain Assessment Pain Assessment: No/denies pain    Home Living                          Prior Function            PT Goals (current goals can now be found in the care plan section) Progress towards PT goals: Progressing toward goals    Frequency    Min 1X/week      PT Plan      Co-evaluation  AM-PAC PT "6 Clicks" Mobility   Outcome Measure  Help needed turning from your back to your side while in a flat bed without using bedrails?: None Help needed moving from lying on your back to sitting on the side of a flat bed without using bedrails?: None Help needed moving to and from a bed to a chair (including a wheelchair)?: A Little Help needed standing up from a chair using your arms (e.g., wheelchair or bedside chair)?: None Help needed to walk in hospital room?: A Little Help needed climbing 3-5 steps with a railing? : A Little 6 Click Score: 21    End of Session Equipment Utilized During Treatment: Gait belt Activity Tolerance: Patient tolerated treatment well Patient left: in bed;with call bell/phone within reach Nurse Communication: Mobility status PT Visit Diagnosis:  Other abnormalities of gait and mobility (R26.89);Muscle weakness (generalized) (M62.81);Pain;Difficulty in walking, not elsewhere classified (R26.2)     Time: 1610-9604 PT Time Calculation (min) (ACUTE ONLY): 21 min  Charges:    $Gait Training: 8-22 mins PT General Charges $$ ACUTE PT VISIT: 1 Visit                     Shela Nevin, PT, DPT Acute Rehab Services [phone]    [name] 07/03/2023, 12:03 PM

## 2023-07-03 NOTE — Progress Notes (Signed)
15 Days Post-Op   Subjective/Chief Complaint: Had a rough night as she feels like she needs to void and is unable to void much.  Having pressure in her pelvis.  Had 2 BMs overnight and 1 this morning.  Objective: Vital signs in last 24 hours: Temp:  [97 F (36.1 C)-98.2 F (36.8 C)] 98.2 F (36.8 C) (10/17 0853) Pulse Rate:  [91-94] 91 (10/17 0853) Resp:  [17-20] 17 (10/17 0853) BP: (109-124)/(69-75) 124/69 (10/17 0853) SpO2:  [96 %-100 %] 100 % (10/17 0853) Weight:  [46.2 kg] 46.2 kg (10/17 0432) Last BM Date : 07/02/23  Intake/Output from previous day: 10/16 0701 - 10/17 0700 In: 700 [P.O.:50; IV Piggyback:500] Out: 700 [Drains:700] Intake/Output this shift: No intake/output data recorded.  Alert, nad, nontoxic Nonlabored G-tube to gravity with bilious output.  .  Midline wound with minimal fibrinous drainage.  PDS loose and free in her wound. Looks much better.  G/J tube in place and dressings  with minimal leakage.  Lab Results:  No results for input(s): "WBC", "HGB", "HCT", "PLT" in the last 72 hours.  BMET Recent Labs    07/02/23 0305  NA 132*  K 3.7  CL 97*  CO2 26  GLUCOSE 110*  BUN 19  CREATININE 0.49  CALCIUM 7.9*    PT/INR No results for input(s): "LABPROT", "INR" in the last 72 hours. ABG No results for input(s): "PHART", "HCO3" in the last 72 hours.  Invalid input(s): "PCO2", "PO2"  Studies/Results: No results found.  Anti-infectives: Anti-infectives (From admission, onward)    Start     Dose/Rate Route Frequency Ordered Stop   06/18/23 1115  cefoTEtan (CEFOTAN) 2 g in sodium chloride 0.9 % 100 mL IVPB        2 g 200 mL/hr over 30 Minutes Intravenous  Once 06/18/23 1107 06/18/23 1124   06/18/23 1103  sodium chloride 0.9 % with cefoTEtan (CEFOTAN) ADS Med       Note to Pharmacy: Payton Emerald A: cabinet override      06/18/23 1103 06/18/23 1133   06/06/23 0600  cefoTEtan (CEFOTAN) 2 g in sodium chloride 0.9 % 100 mL IVPB        2 g 200  mL/hr over 30 Minutes Intravenous On call to O.R. 06/06/23 0555 06/06/23 0818       Assessment/Plan: EXPLORATION LAPAROTOMY, INSERTION OF JEJUNAL FEEDING TUBE (N/A) Gastric outlet obstruction due to chronic peptic ulcer disease    POD#27 s/p Exploratory laparotomy, Distal gastrectomy with Roux-en-Y gastrojejunostomy, Placement of 24-French gastric tube, Placement of 18-French jejunostomy tube 9/20 Dr. Andrey Campanile POD #15 s/p Exploratory laparotomy with takedown and subsequent re-placement of jejunostomy tube (red rubber), lysis of adhesions by Dr. Cliffton Asters for sbo at J-J site  - Surgical path negative for H pylori and negative for malignancy - Cont PPI + -cont  tylenol/roxicodone with PRN dilaudid for pain. A - Cont scheduled Reglan and PRN anti-emetics.  - Tol TF's @ goal - appears to tolerating higher rate over 16hrs (cyclic). New TF formula being tolerated better.  G-tube output back down to 700/24hrs period. -will start to try and cycle clamping g-tube trials again, 4 hrs clamped and 1 hr to gravity. -Monitor leakage from around the G and J-tubes.  UGI shows patent anastomosis. Will attempt clamping of the G-tube again sometime this week - Mobilize, PT - no fu recommended  - J tube resecured at bedside the weekend  of oct 1 -wound infection.  Opened 10/11.  Continue dressing changes.  PDS  free in wound. -on scheduled reglan   ID - Cefotetan periop x 2. None currently.  FEN - cont J tube feeds at 71ml/hr x 16hr per day, adjustments per dietician.  G-tube clamping trials again today. Foley - out and voiding   Anxiety-cont prn Ativan   HTN - PRN meds while NPO Hx Seizures - Home meds Urinary hesitancy - UA negative.  Will trial oxybutynin  DIspo - doing better on new j-tube feed formula.  Reattempt g-tube clamping cycle trials today.   LOS: 27 days    Letha Cape 07/03/2023

## 2023-07-03 NOTE — Progress Notes (Signed)
G-tube unclamped.

## 2023-07-04 LAB — GLUCOSE, CAPILLARY: Glucose-Capillary: 105 mg/dL — ABNORMAL HIGH (ref 70–99)

## 2023-07-04 MED ORDER — METHOCARBAMOL 1000 MG/10ML IJ SOLN
1000.0000 mg | Freq: Four times a day (QID) | INTRAMUSCULAR | Status: DC
  Administered 2023-07-04 – 2023-07-09 (×18): 1000 mg via INTRAVENOUS
  Filled 2023-07-04 (×18): qty 10

## 2023-07-04 NOTE — Progress Notes (Signed)
VAST consult received to obtain PIV access and remove PICC. Upon arrival at patient's bedside patient reported "I'm a hard stick". VAST RN suggested IV assessment be performed to locate any possible vessels that could be used for PIV. Pt agreeable.  Bilateral arms assessed both with [place] and without. Visualized vessels too small or non-compressible and not appropriate for IV placement. Ultrasound assessment revealed one appropriate vessel in each arm. Earl Gala, [place] contacted via [employer] with information. Recommended PICC remain in place for remainder of treatment with IV meds/fluids. Earl Gala, PA responded "just let it be for today".

## 2023-07-04 NOTE — Progress Notes (Signed)
16 Days Post-Op   Subjective/Chief Complaint: Seemed to do rather well with g-tube clamps yesterday.  Some pressure in her upper abdomen, but no increase in leakage around her tubes.  No definite nausea with tubes clamped.  Flomax addition significantly helped bladder symptoms  Objective: Vital signs in last 24 hours: Temp:  [97.7 F (36.5 C)-99 F (37.2 C)] 98.1 F (36.7 C) (10/18 0807) Pulse Rate:  [90-100] 94 (10/18 0807) Resp:  [17-19] 18 (10/18 0807) BP: (93-124)/(53-70) 108/70 (10/18 0807) SpO2:  [97 %-100 %] 100 % (10/18 0807) Weight:  [46.6 kg] 46.6 kg (10/18 0453) Last BM Date : 07/02/23  Intake/Output from previous day: 10/17 0701 - 10/18 0700 In: 1394.5 [P.O.:200; NG/GT:844.5; IV Piggyback:300] Out: 220 [Drains:220] Intake/Output this shift: No intake/output data recorded.  Alert, nad, nontoxic Nonlabored G-tube clamped right now, 220cc/day.  Midline wound with minimal fibrinous drainage.  PDS loose and free in her wound. Looks much better.  G/J tube in place and dressings  with minimal leakage.  Lab Results:  No results for input(s): "WBC", "HGB", "HCT", "PLT" in the last 72 hours.  BMET Recent Labs    07/02/23 0305  NA 132*  K 3.7  CL 97*  CO2 26  GLUCOSE 110*  BUN 19  CREATININE 0.49  CALCIUM 7.9*    PT/INR No results for input(s): "LABPROT", "INR" in the last 72 hours. ABG No results for input(s): "PHART", "HCO3" in the last 72 hours.  Invalid input(s): "PCO2", "PO2"  Studies/Results: No results found.  Anti-infectives: Anti-infectives (From admission, onward)    Start     Dose/Rate Route Frequency Ordered Stop   06/18/23 1115  cefoTEtan (CEFOTAN) 2 g in sodium chloride 0.9 % 100 mL IVPB        2 g 200 mL/hr over 30 Minutes Intravenous  Once 06/18/23 1107 06/18/23 1124   06/18/23 1103  sodium chloride 0.9 % with cefoTEtan (CEFOTAN) ADS Med       Note to Pharmacy: Payton Emerald A: cabinet override      06/18/23 1103 06/18/23 1133    06/06/23 0600  cefoTEtan (CEFOTAN) 2 g in sodium chloride 0.9 % 100 mL IVPB        2 g 200 mL/hr over 30 Minutes Intravenous On call to O.R. 06/06/23 0555 06/06/23 0818       Assessment/Plan: EXPLORATION LAPAROTOMY, INSERTION OF JEJUNAL FEEDING TUBE (N/A) Gastric outlet obstruction due to chronic peptic ulcer disease    POD#28 s/p Exploratory laparotomy, Distal gastrectomy with Roux-en-Y gastrojejunostomy, Placement of 24-French gastric tube, Placement of 18-French jejunostomy tube 9/20 Dr. Andrey Campanile POD #16 s/p Exploratory laparotomy with takedown and subsequent re-placement of jejunostomy tube (red rubber), lysis of adhesions by Dr. Cliffton Asters for sbo at J-J site  - Surgical path negative for H pylori and negative for malignancy - Cont PPI + -cont  tylenol/roxicodone with PRN dilaudid for pain. A - Cont scheduled Reglan and PRN anti-emetics.  - Tol TF's @ goal - appears to tolerating higher rate over 16hrs (cyclic). New TF formula being tolerated better.  G-tube output down to 220/24hrs yesterday with TF cycles.  -cont cycle clamping g-tube trials again, 4 hrs clamped and 1 hr to gravity.  If continues to do well today, then can try some CLD over the weekend and monitor leakage from around the G and J-tubes and tolerance.  UGI shows patent anastomosis. - Mobilize, PT - no fu recommended  - J tube resecured at bedside the weekend  of oct 1 -wound  infection.  Opened 10/11.  Continue dressing changes.  PDS free in wound. -on scheduled reglan   ID - Cefotetan periop x 2. None currently.  FEN - cont J tube feeds at 67ml/hr x 16hr per day, adjustments per dietician.  G-tube clamping trials, possible CLD over the weekend. Foley - out and voiding   Anxiety-cont prn Ativan   HTN - PRN meds while NPO Hx Seizures - Home meds Urinary hesitancy - UA negative. added flomax which helped significantly.  DIspo - doing better on new j-tube feed formula.  Reattempt g-tube clamping cycle trials and if  continues to do well, possibly clears over the weekend   LOS: 28 days    Lindsay Frost 07/04/2023

## 2023-07-04 NOTE — Progress Notes (Signed)
   07/04/23 1255  Mobility  Activity Ambulated independently in hallway;Transferred to/from [facility]  Level of Assistance Other (Comment)  Assistive Device  (IV Pole)  Distance Ambulated (ft) 550 ft  Activity Response Tolerated fair  Mobility Referral Yes  $Mobility charge 1 Mobility  Mobility Specialist Start Time (ACUTE ONLY) 1233  Mobility Specialist Stop Time (ACUTE ONLY) 1253  Mobility Specialist Time Calculation (min) (ACUTE ONLY) 20 min   Mobility Specialist: Progress Note Post-Mobility: SpO2 100% RA  Pt agreeable to mobility session - received in bed. Ambulated independently using IV Pole. C/o gastric pain rated 8/10 . Returned to bed with all needs met - call bell within reach.   Barnie Mort, BS Mobility Specialist Please contact via SecureChat or Rehab office at [phone].

## 2023-07-04 NOTE — Plan of Care (Signed)

## 2023-07-05 LAB — GLUCOSE, CAPILLARY
Glucose-Capillary: 107 mg/dL — ABNORMAL HIGH (ref 70–99)
Glucose-Capillary: 116 mg/dL — ABNORMAL HIGH (ref 70–99)
Glucose-Capillary: 97 mg/dL (ref 70–99)

## 2023-07-05 NOTE — Progress Notes (Signed)
17 Days Post-Op   Subjective/Chief Complaint: Tolerating G-tube clamping 4:1 without worsening belly pain. Minimal drainage from around the tube. Requesting something to drink this morning.   Objective: Vital signs in last 24 hours: Temp:  [97.9 F (36.6 C)-98.6 F (37 C)] 98.1 F (36.7 C) (10/19 0739) Pulse Rate:  [90-103] 90 (10/19 0739) Resp:  [16-18] 17 (10/19 0739) BP: (110-119)/(60-73) 113/60 (10/19 0739) SpO2:  [98 %-100 %] 99 % (10/19 0739) Weight:  [47.6 kg] 47.6 kg (10/19 0500) Last BM Date : 07/05/23  Intake/Output from previous day: 10/18 0701 - 10/19 0700 In: 0  Out: 900 [Drains:900] Intake/Output this shift: Total I/O In: -  Out: 1000 [Drains:1000]  Alert, nad, nontoxic Nonlabored G-tube open now with gastric contents in the bag. PDS loose and free in her wound. Looks much better.  G/J tube in place and dressings  with minimal leakage.  Lab Results:  No results for input(s): "WBC", "HGB", "HCT", "PLT" in the last 72 hours.  BMET No results for input(s): "NA", "K", "CL", "CO2", "GLUCOSE", "BUN", "CREATININE", "CALCIUM" in the last 72 hours.   PT/INR No results for input(s): "LABPROT", "INR" in the last 72 hours. ABG No results for input(s): "PHART", "HCO3" in the last 72 hours.  Invalid input(s): "PCO2", "PO2"  Studies/Results: No results found.  Anti-infectives: Anti-infectives (From admission, onward)    Start     Dose/Rate Route Frequency Ordered Stop   06/18/23 1115  cefoTEtan (CEFOTAN) 2 g in sodium chloride 0.9 % 100 mL IVPB        2 g 200 mL/hr over 30 Minutes Intravenous  Once 06/18/23 1107 06/18/23 1124   06/18/23 1103  sodium chloride 0.9 % with cefoTEtan (CEFOTAN) ADS Med       Note to Pharmacy: Payton Emerald A: cabinet override      06/18/23 1103 06/18/23 1133   06/06/23 0600  cefoTEtan (CEFOTAN) 2 g in sodium chloride 0.9 % 100 mL IVPB        2 g 200 mL/hr over 30 Minutes Intravenous On call to O.R. 06/06/23 0555 06/06/23 0818        Assessment/Plan: EXPLORATION LAPAROTOMY, INSERTION OF JEJUNAL FEEDING TUBE (N/A) Gastric outlet obstruction due to chronic peptic ulcer disease    POD#29 s/p Exploratory laparotomy, Distal gastrectomy with Roux-en-Y gastrojejunostomy, Placement of 24-French gastric tube, Placement of 18-French jejunostomy tube 9/20 Dr. Andrey Campanile POD #17 s/p Exploratory laparotomy with takedown and subsequent re-placement of jejunostomy tube (red rubber), lysis of adhesions by Dr. Cliffton Asters for sbo at J-J site  - Surgical path negative for H pylori and negative for malignancy - Cont PPI + -cont  tylenol/roxicodone with PRN dilaudid for pain. A - Cont scheduled Reglan and PRN anti-emetics.  - Tol TF's @ goal - appears to tolerating higher rate over 16hrs (cyclic). New TF formula being tolerated better.  -cont cycle clamping g-tube trials again, Increase interval to 6 hrs clamped and 1 hr to gravity. Will trial CLD today - Mobilize, PT - no fu recommended  - J tube resecured at bedside the weekend  of oct 1 -wound infection.  Opened 10/11.  Continue dressing changes.  PDS free in wound. -on scheduled reglan   ID - Cefotetan periop x 2. None currently.  FEN - cont J tube feeds at 52ml/hr x 16hr per day, adjustments per dietician.  Start CLD today, volume restriction to Foley - out and voiding   Anxiety-cont prn Ativan   HTN - PRN meds while NPO Hx  Seizures - Home meds Urinary hesitancy - UA negative. added flomax which helped significantly.  DIspo - doing better on new j-tube feed formula.  Trial CLD today   LOS: 29 days    Moise Boring 07/05/2023

## 2023-07-05 NOTE — Plan of Care (Signed)

## 2023-07-06 LAB — GLUCOSE, CAPILLARY
Glucose-Capillary: 103 mg/dL — ABNORMAL HIGH (ref 70–99)
Glucose-Capillary: 121 mg/dL — ABNORMAL HIGH (ref 70–99)
Glucose-Capillary: 149 mg/dL — ABNORMAL HIGH (ref 70–99)
Glucose-Capillary: 94 mg/dL (ref 70–99)
Glucose-Capillary: 98 mg/dL (ref 70–99)

## 2023-07-06 NOTE — Plan of Care (Signed)

## 2023-07-06 NOTE — Progress Notes (Signed)
   07/06/23 1315  Mobility  Activity Ambulated independently in hallway  Level of Assistance Contact guard assist, steadying assist  Assistive Device Other (Comment) (IV Pole)  Distance Ambulated (ft) 550 ft  Activity Response Tolerated fair  Mobility Referral Yes  $Mobility charge 1 Mobility  Mobility Specialist Start Time (ACUTE ONLY) 1257  Mobility Specialist Stop Time (ACUTE ONLY) 1312  Mobility Specialist Time Calculation (min) (ACUTE ONLY) 15 min   Mobility Specialist: Progress Note  Post-Mobility: HR 103, SpO2 100  Pt agreeable to mobility session - received in bed. Required  CG using IV Pole, drifting L to R. Pt with no complaints, heavy breathing EOS, VSS. Returned to bed with all needs met - call bell within reach.   Barnie Mort, BS Mobility Specialist Please contact via SecureChat or Rehab office at [phone].

## 2023-07-06 NOTE — Plan of Care (Signed)

## 2023-07-06 NOTE — Progress Notes (Signed)
18 Days Post-Op   Subjective/Chief Complaint: NGT output was 1.3L but 1L was in the morning before starting clears.  She reports that she did well with broth but did have some bloating with gingerale. Tolerated clamping for 6 hr intervals.    Objective: Vital signs in last 24 hours: Temp:  [97.8 F (36.6 C)-98.1 F (36.7 C)] 97.8 F (36.6 C) (10/20 0540) Pulse Rate:  [87-94] 89 (10/20 0540) Resp:  [17] 17 (10/19 1547) BP: (108-121)/(62-74) 109/68 (10/20 0540) SpO2:  [97 %-99 %] 99 % (10/20 0540) Weight:  [47.6 kg] 47.6 kg (10/20 0500) Last BM Date : 07/05/23  Intake/Output from previous day: 10/19 0701 - 10/20 0700 In: -  Out: 1300 [Drains:1300] Intake/Output this shift: No intake/output data recorded.  Alert, nad, nontoxic Nonlabored G-tube open now with gastric contents in the bag. PDS loose and free in her wound. Looks much better.  G/J tube in place and dressings  with minimal leakage.  Lab Results:  No results for input(s): "WBC", "HGB", "HCT", "PLT" in the last 72 hours.  BMET No results for input(s): "NA", "K", "CL", "CO2", "GLUCOSE", "BUN", "CREATININE", "CALCIUM" in the last 72 hours.   PT/INR No results for input(s): "LABPROT", "INR" in the last 72 hours. ABG No results for input(s): "PHART", "HCO3" in the last 72 hours.  Invalid input(s): "PCO2", "PO2"  Studies/Results: No results found.  Anti-infectives: Anti-infectives (From admission, onward)    Start     Dose/Rate Route Frequency Ordered Stop   06/18/23 1115  cefoTEtan (CEFOTAN) 2 g in sodium chloride 0.9 % 100 mL IVPB        2 g 200 mL/hr over 30 Minutes Intravenous  Once 06/18/23 1107 06/18/23 1124   06/18/23 1103  sodium chloride 0.9 % with cefoTEtan (CEFOTAN) ADS Med       Note to Pharmacy: Payton Emerald A: cabinet override      06/18/23 1103 06/18/23 1133   06/06/23 0600  cefoTEtan (CEFOTAN) 2 g in sodium chloride 0.9 % 100 mL IVPB        2 g 200 mL/hr over 30 Minutes Intravenous On call  to O.R. 06/06/23 0555 06/06/23 0818       Assessment/Plan: EXPLORATION LAPAROTOMY, INSERTION OF JEJUNAL FEEDING TUBE (N/A) Gastric outlet obstruction due to chronic peptic ulcer disease    POD#30 s/p Exploratory laparotomy, Distal gastrectomy with Roux-en-Y gastrojejunostomy, Placement of 24-French gastric tube, Placement of 18-French jejunostomy tube 9/20 Dr. Andrey Campanile POD #18 s/p Exploratory laparotomy with takedown and subsequent re-placement of jejunostomy tube (red rubber), lysis of adhesions by Dr. Cliffton Asters for sbo at J-J site  - Surgical path negative for H pylori and negative for malignancy - Cont PPI + -cont  tylenol/roxicodone with PRN dilaudid for pain. A - Cont scheduled Reglan and PRN anti-emetics.  - Tol TF's @ goal - appears to tolerating higher rate over 16hrs (cyclic). New TF formula being tolerated better.  -cont cycle clamping g-tube trials again, Increase interval to 8 hrs clamped and 1 hr to gravity. CLD with 1.2L limit.  Discussed with her this morning to avoid carbonated beverages. - Mobilize, PT - no fu recommended  - J tube resecured at bedside the weekend  of oct 1 -wound infection.  Opened 10/11.  Continue dressing changes.  PDS free in wound. -on scheduled reglan   ID - Cefotetan periop x 2. None currently.  FEN - cont J tube feeds at 11ml/hr x 16hr per day, adjustments per dietician.  CLD today, volume restriction  to Foley - out and voiding   Anxiety-cont prn Ativan   HTN - PRN meds while NPO Hx Seizures - Home meds Urinary hesitancy - UA negative. added flomax which helped significantly.  DIspo - doing better on new j-tube feed formula.  Trial CLD today   LOS: 30 days    Moise Boring 07/06/2023

## 2023-07-07 ENCOUNTER — Inpatient Hospital Stay (HOSPITAL_COMMUNITY): Payer: 59

## 2023-07-07 HISTORY — PX: IR REPLC GASTRO/COLONIC TUBE PERCUT W/FLUORO: IMG2333

## 2023-07-07 LAB — BASIC METABOLIC PANEL
Anion gap: 9 (ref 5–15)
BUN: 19 mg/dL (ref 8–23)
CO2: 27 mmol/L (ref 22–32)
Calcium: 8.1 mg/dL — ABNORMAL LOW (ref 8.9–10.3)
Chloride: 100 mmol/L (ref 98–111)
Creatinine, Ser: 0.43 mg/dL — ABNORMAL LOW (ref 0.44–1.00)
GFR, Estimated: >60 mL/min (ref >60)
Glucose, Bld: 129 mg/dL — ABNORMAL HIGH (ref 70–99)
Potassium: 4.1 mmol/L (ref 3.5–5.1)
Sodium: 136 mmol/L (ref 135–145)

## 2023-07-07 LAB — CBC
HCT: 31.7 % — ABNORMAL LOW (ref 36.0–46.0)
Hemoglobin: 9.8 g/dL — ABNORMAL LOW (ref 12.0–15.0)
MCH: 30.2 pg (ref 26.0–34.0)
MCHC: 30.9 g/dL (ref 30.0–36.0)
MCV: 97.5 fL (ref 80.0–100.0)
Platelets: 476 10*3/uL — ABNORMAL HIGH (ref 150–400)
RBC: 3.25 MIL/uL — ABNORMAL LOW (ref 3.87–5.11)
RDW: 18.2 % — ABNORMAL HIGH (ref 11.5–15.5)
WBC: 9.5 10*3/uL (ref 4.0–10.5)
nRBC: 0 % (ref 0.0–0.2)

## 2023-07-07 LAB — GLUCOSE, CAPILLARY
Glucose-Capillary: 95 mg/dL (ref 70–99)
Glucose-Capillary: 96 mg/dL (ref 70–99)

## 2023-07-07 MED ORDER — IOHEXOL 300 MG/ML  SOLN
50.0000 mL | Freq: Once | INTRAMUSCULAR | Status: AC | PRN
Start: 2023-07-07 — End: 2023-07-07
  Administered 2023-07-07: 10 mL

## 2023-07-07 MED ORDER — DIATRIZOATE MEGLUMINE & SODIUM 66-10 % PO SOLN
30.0000 mL | Freq: Once | ORAL | Status: AC
  Administered 2023-07-07: 30 mL
  Filled 2023-07-07: qty 30

## 2023-07-07 MED ORDER — LIDOCAINE VISCOUS HCL 2 % MT SOLN
OROMUCOSAL | Status: AC
Start: 2023-07-07 — End: ?
  Filled 2023-07-07: qty 15

## 2023-07-07 MED ORDER — DIATRIZOATE MEGLUMINE & SODIUM 66-10 % PO SOLN
ORAL | Status: AC
  Filled 2023-07-07: qty 30

## 2023-07-07 NOTE — Progress Notes (Addendum)
Pt complain pain constantly on left side on her abdomen. RN tried to re open the dressing on the abdomen and found out that the Gastrostomy (G-tube) was accidentally came off. RN page the on call provider Dr. Janee Morn made aware. Will continue to monitor.

## 2023-07-07 NOTE — Progress Notes (Signed)
19 Days Post-Op   Subjective/Chief Complaint: C/o deep LUQ pain. G tube noted by RN to be out around 0530. Also c/o poor sleep. Was tolerating G tube clamping trails yesterday and states she has been walking once daily., getting up to bedside commode, and sitting in chair. Her sister-in-law is at the bedside.  Objective: Vital signs in last 24 hours: Temp:  [97.2 F (36.2 C)-98.2 F (36.8 C)] 97.6 F (36.4 C) (10/21 0506) Pulse Rate:  [86-104] 104 (10/21 0506) Resp:  [17-18] 18 (10/21 0506) BP: (105-139)/(60-81) 139/81 (10/21 0506) SpO2:  [98 %-99 %] 98 % (10/21 0506) Last BM Date : 07/05/23  Intake/Output from previous day: 10/20 0701 - 10/21 0700 In: 340 [P.O.:240; IV Piggyback:100] Out: -  Intake/Output this shift: No intake/output data recorded.  Alert, nad, nontoxic Nonlabored G-tube and stitch completely out, sitting on top her her abdominal wall. J tube in place and dressings  with minimal leakage.  Lab Results:  Recent Labs    07/07/23 0432  WBC 9.5  HGB 9.8*  HCT 31.7*  PLT 476*    BMET Recent Labs    07/07/23 0432  NA 136  K 4.1  CL 100  CO2 27  GLUCOSE 129*  BUN 19  CREATININE 0.43*  CALCIUM 8.1*     PT/INR No results for input(s): "LABPROT", "INR" in the last 72 hours. ABG No results for input(s): "PHART", "HCO3" in the last 72 hours.  Invalid input(s): "PCO2", "PO2"  Studies/Results: No results found.  Anti-infectives: Anti-infectives (From admission, onward)    Start     Dose/Rate Route Frequency Ordered Stop   06/18/23 1115  cefoTEtan (CEFOTAN) 2 g in sodium chloride 0.9 % 100 mL IVPB        2 g 200 mL/hr over 30 Minutes Intravenous  Once 06/18/23 1107 06/18/23 1124   06/18/23 1103  sodium chloride 0.9 % with cefoTEtan (CEFOTAN) ADS Med       Note to Pharmacy: Payton Emerald A: cabinet override      06/18/23 1103 06/18/23 1133   06/06/23 0600  cefoTEtan (CEFOTAN) 2 g in sodium chloride 0.9 % 100 mL IVPB        2 g 200 mL/hr over  30 Minutes Intravenous On call to O.R. 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 Andrey Campanile POD #19 s/p Exploratory laparotomy with takedown and subsequent re-placement of jejunostomy tube (red rubber), lysis of adhesions by Dr. Cliffton Asters for sbo at J-J site  - Surgical path negative for H pylori and negative for malignancy - Cont PPI + -cont  tylenol/roxicodone with PRN dilaudid for pain. A - Cont scheduled Reglan and PRN anti-emetics.  - Tol TF's @ goal - appears to tolerating higher rate over 16hrs (cyclic). New TF formula being tolerated better.  - G tube fell out. Replaced today with foley catheter - abdominal film pending to confirm placement. Tube to remain to gravity until placement is confirmed. Once confirmed I will ask IR to replace with a formal G tube.  Then can resume 8 hrs clamped and 1 hr to gravity. CLD with 1.2L limit. avoid carbonated beverages. - Mobilize, PT - no fu recommended  - J tube resecured at bedside the weekend of oct 1 -  wound infection.  Opened 10/11.  Continue dressing changes.  PDS free in wound. - on scheduled reglan   ID - Cefotetan periop x 2. None currently.  FEN - cont J tube feeds at 84ml/hr x 16hr per day, adjustments per dietician. NPO  Foley - out and voiding   Anxiety-cont prn Ativan   HTN - PRN meds while NPO Hx Seizures - Home meds Urinary hesitancy - UA negative. added flomax which helped significantly.    LOS: 31 days    Adam Phenix PA-C 07/07/2023

## 2023-07-07 NOTE — Progress Notes (Signed)
Attempted to round on pt but she is in IR for G tube replacement  Resume clamping trials of G tube Resume J tube feeds this evening   Mary Sella. Andrey Campanile, MD, FACS General, Bariatric, & Minimally Invasive Surgery [facility],  [facility]

## 2023-07-08 LAB — GLUCOSE, CAPILLARY
Glucose-Capillary: 127 mg/dL — ABNORMAL HIGH (ref 70–99)
Glucose-Capillary: 90 mg/dL (ref 70–99)
Glucose-Capillary: 78 mg/dL (ref 70–99)
Glucose-Capillary: 121 mg/dL — ABNORMAL HIGH (ref 70–99)

## 2023-07-08 NOTE — Progress Notes (Signed)
Nutrition Follow-up  DOCUMENTATION CODES:   Severe malnutrition in context of chronic illness, Underweight  INTERVENTION:  Continue TF via J-tube: Jevity 1.5 at 90ml x12 hours (6p-6a) Free water flushes q4h ( per day) 60ml ProSouce TF once daily  Provides 1700 kcal, 89g protein and free water daily  Continue Vitamin D repletion until 10/29; recommend rechecking lab on follow up  Continue liquid multivitamin with minerals BID per tube to help meet micronutrient needs in setting of anatomy and risk for decreased absorption.   NUTRITION DIAGNOSIS:   Severe Malnutrition related to chronic illness (PUD, gastroparesis, esophagitis, duodenal bulb stenosis) as evidenced by severe fat depletion, severe muscle depletion. - remains applicable  GOAL:   Patient will meet greater than or equal to 90% of their needs - goal met via TF  MONITOR:   TF tolerance, Labs, Weight trends  REASON FOR ASSESSMENT:   Consult Enteral/tube feeding initiation and management (resume trickle tube feeds)  ASSESSMENT:   67 yo female admitted with GOO d/t chronic PUD. S/P distal gastrectomy with roux en y gastrojejunostomy and placement of gastric and jejunal feeding tubes 9/20. PMH includes PUD, gastroparesis, duodenal diverticulum, duodenal bulb stenosis, esophagitis, GERD, hiatal hernia, gallstones, meningioma, HTN, seizures, B-12 deficiency.  9/20: s/p exlap, distal gastrectomy with roux-en-y gastrojejunostomy, placement of 24 Fr. G-tube and 18 Fr. J-tube 9/22: trickle TF initiated 9/25: s/p abd xray-no bowel obstruction; TF advanced to 29ml/h  10/2: exlap with takedown, re-placement of J-tube (red rubber tube), lysis of adhesions for SBO at J-J site 10/8: tube feeds condensed to be over 20 hours 10/9: condensing tube feeds over 16 hours  10/12: TF adjusted to 12 hour feeds 10/20: G-tube clamping trails 10/21: G tube replaced by IR d/t falling out  Pt sitting up in bed at time of  visit, eating jello. Pt reports that she was unable to have any po yesterday d/t G-tube falling out and having to be replaced. J-tube feeding resumed following procedure. She is doing well on clear liquid diet and denies any complaints of n/v/d.   Meal completions: (clear liquid diet) 10/20: 100% breakfast 10/21: 100% dinner 10/22: 80% breakfast  Surgery planning for continuous G-tube clamping.  Will continue with current TF recommendations.  Pt's weight has been holding at ~47.5 kg x4 days.  Noted fluctuations throughout admission.  Back to admission weight.  Medications: Vitamin D3 1000 units daily, reglan, liquid MVI, protonix  Labs: Cr 0.43, CBG's 90-127 x24 hours  Diet Order:   Diet Order             Diet clear liquid Fluid consistency: Thin  Diet effective now                   EDUCATION NEEDS:   Education needs have been addressed  Skin:  Skin Assessment: Skin Integrity Issues: Skin Integrity Issues:: Incisions Incisions: surgical incision to abdomen  Last BM:  10/22 (type 7 x1 medium)  Height:   Ht Readings from Last 1 Encounters:  06/18/23 5\' 4"  (1.626 m)    Weight:   Wt Readings from Last 1 Encounters:  07/08/23 47.5 kg    Ideal Body Weight:  54.5 kg  BMI:  Body mass index is 17.97 kg/m.  Estimated Nutritional Needs:   Kcal:  1650-1850  Protein:  70-90 gm  Fluid:  1.7-1.9 L  Drusilla Kanner, RDN, LDN Clinical Nutrition

## 2023-07-08 NOTE — Plan of Care (Signed)

## 2023-07-08 NOTE — Progress Notes (Signed)
Patient declines to have PICC pulled tonight. Patient states "my veins are bad and they have to stick me over and over" Wants to discuss with MD before PICC is pulled. Notified nurse. Tomasita Morrow, RN VAST

## 2023-07-08 NOTE — TOC Progression Note (Signed)
Transition of Care (TOC) - Progression Note   TOC Team continues to follow  Patient Details  Name: Lindsay Frost MRN: [id number] Date of Birth: [birth year]  Transition of Care [facility]) CM/SW Contact  Kainalu Heggs, Adria Devon, RN Phone Number: 07/08/2023, 2:23 PM  Clinical Narrative:       Expected Discharge Plan: Home w Home Health Services Barriers to Discharge: Continued Medical Work up  Expected Discharge Plan and Services   Discharge Planning Services: CM Consult Post Acute Care Choice: Home Health, Durable Medical Equipment Living arrangements for the past 2 months: Apartment                 DME Arranged: 3-N-1                     Social Determinants of Health (SDOH) Interventions SDOH Screenings   Food Insecurity: No Food Insecurity (06/19/2023)  Housing: Low Risk  (06/19/2023)  Transportation Needs: No Transportation Needs (06/19/2023)  Utilities: Not At Risk (06/19/2023)  Alcohol Screen: Low Risk  (11/13/2022)  Depression (PHQ2-9): Low Risk  (02/18/2023)  Financial Resource Strain: Low Risk  (02/17/2023)  Physical Activity: Insufficiently Active (02/17/2023)  Social Connections: Socially Isolated (02/17/2023)  Stress: No Stress Concern Present (02/17/2023)  Tobacco Use: Low Risk  (06/18/2023)    Readmission Risk Interventions     No data to display

## 2023-07-08 NOTE — Progress Notes (Signed)
20 Days Post-Op   Subjective/Chief Complaint: G tube replaced by IR yesterday pm No issues overnight Reports firm BM and then a loose BM Tolerating clamp  Objective: Vital signs in last 24 hours: Temp:  [98 F (36.7 C)-98.4 F (36.9 C)] 98.4 F (36.9 C) (10/22 0823) Pulse Rate:  [82-102] 82 (10/22 0823) Resp:  [15-18] 16 (10/22 0823) BP: (107-120)/(64-73) 113/64 (10/22 0823) SpO2:  [98 %-99 %] 99 % (10/22 0823) Weight:  [47.5 kg] 47.5 kg (10/22 0404) Last BM Date : 07/05/23 (G tube)  Intake/Output from previous day: 10/21 0701 - 10/22 0700 In: 490 [P.O.:360] Out: 600 [Drains:600] Intake/Output this shift: No intake/output data recorded.  Alert, nad, nontoxic Nonlabored G-tube intact. J tube in place and dressings  with minimal leakage.  Minimal output when I release G tube clamp. - reclamp.   Lab Results:  Recent Labs    07/07/23 0432  WBC 9.5  HGB 9.8*  HCT 31.7*  PLT 476*    BMET Recent Labs    07/07/23 0432  NA 136  K 4.1  CL 100  CO2 27  GLUCOSE 129*  BUN 19  CREATININE 0.43*  CALCIUM 8.1*     PT/INR No results for input(s): "LABPROT", "INR" in the last 72 hours. ABG No results for input(s): "PHART", "HCO3" in the last 72 hours.  Invalid input(s): "PCO2", "PO2"  Studies/Results: IR REPLACE GASTRO/JEJUNO TUBE PERC W/FLUORO  Result Date: 07/07/2023 INDICATION: Patient with history of laparotomy, distal gastrectomy with Roux-en-Y and gastrostomy and jejunostomy tube placement for chronic gastric outlet obstruction found to have her G-tube malpositioned. Patient presents for gastrostomy tube placement. EXAM: PUSH GASTROSTOMY TUBE PLACEMENT COMPARISON:  None Available. MEDICATIONS: No; Antibiotics were administered within 1 hour of the procedure. CONTRAST:  Five mL of Isovue 300 administered into the gastric lumen. ANESTHESIA/SEDATION: Moderate (conscious) sedation was not employed during this procedure FLUOROSCOPY TIME:  (1 mGy) COMPLICATIONS: None  immediate. PROCEDURE: Informed written consent was obtained from the patient. Following explanation of the procedure, risks, benefits and alternatives. A time out was performed prior to the initiation of the procedure. Ultrasound scanning was performed to demarcate the edge of the left lobe of the liver. Maximal barrier sterile technique utilized including caps, mask, sterile gowns, sterile gloves, large sterile drape, hand hygiene and Betadine prep. The right upper quadrant was sterilely prepped and draped. Under intermittent fluoroscopic guidance, the place holder catheter. Ultimately allowing placement of a 24-French balloon retention gastrostomy tube. The retention balloon was insufflated with a mixture of dilute saline and pulled taut against the anterior wall of the stomach. The external disc was cinched. Contrast injection confirms positioning within the stomach. Spot radiographic images confirmed placement within the stomach. The patient tolerated procedure well without immediate post procedural complication. FINDINGS: After successful fluoroscopic guided placement, the gastrostomy tube is appropriately positioned with internal retention balloon against the ventral aspect of the gastric lumen. IMPRESSION: Successful fluoroscopic insertion of an 51 French balloon retention gastrostomy tube through a pre-existing gastrostomy tube tract. The tip of the new gastrostomy tube lies in the stomach. The gastrostomy tube is available to use at this time. Performed by Anders Grant NP Electronically Signed   By: Irish Lack M.D.   On: 07/07/2023 16:47   DG ABDOMEN PEG TUBE LOCATION  Result Date: 07/07/2023 CLINICAL DATA:  Gastrostomy tube replacement. EXAM: ABDOMEN - 1 VIEW COMPARISON:  June 29, 2023. FINDINGS: Gastrostomy tube tip appears to be within gastric lumen with normal contrast filling of the gastric  lumen. Jejunostomy tube is also noted and there is also noted contrast filling of probable  jejunal loops. IMPRESSION: Gastrostomy tube tip appears to be within gastric lumen with normal contrast filling of gastric lumen. Jejunostomy tube is noted as well and there is noted contrast filling of probable jejunal loops in the left side of the abdomen. Electronically Signed   By: Lupita Raider M.D.   On: 07/07/2023 10:56    Anti-infectives: Anti-infectives (From admission, onward)    Start     Dose/Rate Route Frequency Ordered Stop   06/18/23 1115  cefoTEtan (CEFOTAN) 2 g in sodium chloride 0.9 % 100 mL IVPB        2 g 200 mL/hr over 30 Minutes Intravenous  Once 06/18/23 1107 06/18/23 1124   06/18/23 1103  sodium chloride 0.9 % with cefoTEtan (CEFOTAN) ADS Med       Note to Pharmacy: Payton Emerald A: cabinet override      06/18/23 1103 06/18/23 1133   06/06/23 0600  cefoTEtan (CEFOTAN) 2 g in sodium chloride 0.9 % 100 mL IVPB        2 g 200 mL/hr over 30 Minutes Intravenous On call to O.R. 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 Andrey Campanile POD #20 s/p Exploratory laparotomy with takedown and subsequent re-placement of jejunostomy tube (red rubber), lysis of adhesions by Dr. Cliffton Asters for sbo at J-J site  - Surgical path negative for H pylori and negative for malignancy - Cont PPI + -cont  tylenol/roxicodone with PRN dilaudid for pain. A - Cont scheduled Reglan and PRN anti-emetics.  - Tol TF's @ goal - appears to tolerating higher rate over 16hrs (cyclic). New TF formula being tolerated better.  - G tube fell out. Replaced by IR 10/21. CLD with 1.2L limit. avoid carbonated beverages.will keep G tube clamped continuously - Mobilize, PT - no fu recommended  - J tube resecured  at bedside the weekend of oct 1 - wound infection.  Opened 10/11.  Continue dressing changes.  PDS free in wound. - on scheduled reglan   ID - Cefotetan periop x 2. None currently.  FEN - cont J tube feeds at 13ml/hr x 16hr per day, adjustments per dietician. CLD  Foley - out and voiding   Anxiety-cont prn Ativan   HTN - PRN meds while NPO Hx Seizures - Home meds Urinary hesitancy - UA negative. added flomax which helped significantly.  Remove PICC line   LOS: 32 days    Gaynelle Adu MD 07/08/2023

## 2023-07-08 NOTE — Progress Notes (Signed)
Physical Therapy Treatment Patient Details Name: Lindsay Frost MRN: [id number] DOB: July 16, 1956 Today's Date: 07/08/2023   History of Present Illness 67 y.o. 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 10/2 S/P  s/p Exploratory laparotomy with takedown and subsequent re-placement of jejunostomy tube (red rubber), lysis of adhesions. PMH: back pain, cholecystectomy, gastric ulcer, depression.    PT Comments  Pt reports she is feeling better this morning and is agreeable to getting up to walk with therapy. As PT was settting up recliner for pt to sit in after ambulation, pt politely asks if she can get back to bed due to uncomfortable recliner. PT suggests a different type of chair. Sling dialysis chairs available on floor so as part of ambulation had pt trial chair in hallway. Pt reports slightly more comfortable and would like to switch out with the recliner in her room. Pt able to ambulate in hallway with mildly unsteady gait, no outside assist required. Pt continues to have some slowed processing and difficulty with wayfinding in hallway despite numerous walks. Pt request to return to room to use bathroom. After using BSC, pt set up in sling chair and reports it is much better. D/c plans remain appropriate at this time. PT will continue to follow acutely.      If plan is discharge home, recommend the following: A little help with walking and/or transfers;A little help with bathing/dressing/bathroom;Assistance with cooking/housework;Assist for transportation   Can travel by private vehicle      Yes  Equipment Recommendations  BSC/3in1       Precautions / Restrictions Precautions Precautions: Fall Precaution Comments: abdominal surgery, foley, PEG, JG drain, mod fall Required Braces or Orthoses: Other Brace Other  Brace: abdominal binder Restrictions Weight Bearing Restrictions: No     Mobility  Bed Mobility Overal bed mobility: Needs Assistance Bed Mobility: Supine to Sit Rolling: Supervision Sidelying to sit: Supervision Supine to sit: Supervision, Used rails, HOB elevated   Sit to sidelying: Supervision General bed mobility comments: cueing to roll to protect abdomen    Transfers Overall transfer level: Needs assistance Equipment used: None Transfers: Sit to/from Stand Sit to Stand: Supervision           General transfer comment: for lines and safety    Ambulation/Gait Ambulation/Gait assistance: Supervision Gait Distance (Feet): 250 Feet Assistive device: IV Pole Gait Pattern/deviations: Step-through pattern, Decreased step length - right, Decreased step length - left, Narrow base of support, Trunk flexed Gait velocity: decreased Gait velocity interpretation: <1.31 ft/sec, indicative of household ambulator   General Gait Details: no LOB. Slow steady gait      Balance Overall balance assessment: Mild deficits observed, not formally tested                                          Cognition Arousal: Alert Behavior During Therapy: WFL for tasks assessed/performed Overall Cognitive Status: Within Functional Limits for tasks assessed                               Problem Solving: Slow processing, Requires verbal cues, Difficulty sequencing General Comments: some slow processing and decreased problem solving in terms of IV pole management,  General Comments General comments (skin integrity, edema, etc.): VSS      Pertinent Vitals/Pain Pain Assessment Pain Assessment: Faces Faces Pain Scale: Hurts a little bit Breathing: normal Negative Vocalization: none Facial Expression: smiling or inexpressive Body Language: relaxed Consolability: no need to console PAINAD Score: 0 Pain Location: abdomen and back Pain Descriptors /  Indicators: Discomfort Pain Intervention(s): Limited activity within patient's tolerance, Monitored during session, Repositioned    Home Living Family/patient expects to be discharged to:: Private residence Living Arrangements: Other relatives Available Help at Discharge: Family;Available PRN/intermittently Type of Home: House Home Access: Level entry       Home Layout: One level Home Equipment: None Additional Comments: Pt reports that sister is present but is older than her and unable to help physically with ADL other than bringing a glass of water or another similar task        PT Goals (current goals can now be found in the care plan section) Acute Rehab PT Goals PT Goal Formulation: With patient Time For Goal Achievement: 06/26/23 Potential to Achieve Goals: Good Progress towards PT goals: Progressing toward goals    Frequency    Min 1X/week       AM-PAC PT "6 Clicks" Mobility   Outcome Measure  Help needed turning from your back to your side while in a flat bed without using bedrails?: None Help needed moving from lying on your back to sitting on the side of a flat bed without using bedrails?: None Help needed moving to and from a bed to a chair (including a wheelchair)?: A Little Help needed standing up from a chair using your arms (e.g., wheelchair or bedside chair)?: None Help needed to walk in hospital room?: A Little Help needed climbing 3-5 steps with a railing? : A Little 6 Click Score: 21    End of Session Equipment Utilized During Treatment: Gait belt Activity Tolerance: Patient tolerated treatment well Patient left: in chair;with call bell/phone within reach Nurse Communication: Mobility status PT Visit Diagnosis: Other abnormalities of gait and mobility (R26.89);Muscle weakness (generalized) (M62.81);Pain;Difficulty in walking, not elsewhere classified (R26.2) Pain - part of body:  (abdomen and back)     Time: 0960-4540 PT Time Calculation (min)  (ACUTE ONLY): 19 min  Charges:    $Therapeutic Exercise: 8-22 mins PT General Charges $$ ACUTE PT VISIT: 1 Visit                     Reegan Bouffard B. Beverely Risen PT, DPT Acute Rehabilitation Services Please use secure chat or  Call Office [phone]    [name] [facility] 07/08/2023, 8:54 AM

## 2023-07-09 ENCOUNTER — Inpatient Hospital Stay (HOSPITAL_COMMUNITY): Payer: 59

## 2023-07-09 LAB — BASIC METABOLIC PANEL
Anion gap: 6 (ref 5–15)
BUN: 16 mg/dL (ref 8–23)
CO2: 25 mmol/L (ref 22–32)
Calcium: 7.6 mg/dL — ABNORMAL LOW (ref 8.9–10.3)
Chloride: 104 mmol/L (ref 98–111)
Creatinine, Ser: 0.31 mg/dL — ABNORMAL LOW (ref 0.44–1.00)
GFR, Estimated: >60 mL/min (ref >60)
Glucose, Bld: 91 mg/dL (ref 70–99)
Potassium: 4.2 mmol/L (ref 3.5–5.1)
Sodium: 135 mmol/L (ref 135–145)

## 2023-07-09 LAB — GLUCOSE, CAPILLARY: Glucose-Capillary: 99 mg/dL (ref 70–99)

## 2023-07-09 MED ORDER — ACETAMINOPHEN 500 MG PO TABS
1000.0000 mg | ORAL_TABLET | Freq: Four times a day (QID) | ORAL | Status: DC
  Administered 2023-07-09 – 2023-07-11 (×5): 1000 mg via ORAL
  Filled 2023-07-09 (×7): qty 2

## 2023-07-09 MED ORDER — BOOST / RESOURCE BREEZE PO LIQD CUSTOM
1.0000 | Freq: Two times a day (BID) | ORAL | Status: DC
  Administered 2023-07-10 (×2): 1 via ORAL

## 2023-07-09 MED ORDER — OXYCODONE HCL 5 MG PO TABS
5.0000 mg | ORAL_TABLET | ORAL | Status: DC | PRN
  Administered 2023-07-09 – 2023-07-10 (×4): 10 mg via ORAL
  Administered 2023-07-11: 5 mg via ORAL
  Filled 2023-07-09 (×5): qty 2

## 2023-07-09 MED ORDER — OXYCODONE HCL 5 MG PO TABS: 5.0000 mg | ORAL_TABLET | Freq: Two times a day (BID) | ORAL | Status: DC | PRN

## 2023-07-09 MED ORDER — HYDROMORPHONE HCL 1 MG/ML IJ SOLN
0.5000 mg | Freq: Four times a day (QID) | INTRAMUSCULAR | Status: DC | PRN
  Administered 2023-07-09 – 2023-07-10 (×4): 1 mg via INTRAVENOUS
  Filled 2023-07-09 (×4): qty 1

## 2023-07-09 MED ORDER — OXYCODONE HCL 5 MG/5ML PO SOLN: 5.0000 mg | Freq: Two times a day (BID) | ORAL | Status: DC | PRN

## 2023-07-09 MED ORDER — TAB-A-VITE/IRON PO TABS
1.0000 | ORAL_TABLET | Freq: Every day | ORAL | Status: DC
  Administered 2023-07-09 – 2023-07-11 (×3): 1 via ORAL
  Filled 2023-07-09 (×5): qty 1

## 2023-07-09 MED ORDER — METHOCARBAMOL 500 MG PO TABS
1000.0000 mg | ORAL_TABLET | Freq: Four times a day (QID) | ORAL | Status: DC
  Administered 2023-07-09 – 2023-07-11 (×7): 1000 mg via ORAL
  Filled 2023-07-09 (×7): qty 2

## 2023-07-09 MED ORDER — LORAZEPAM 0.5 MG PO TABS
0.5000 mg | ORAL_TABLET | Freq: Two times a day (BID) | ORAL | Status: DC | PRN
  Administered 2023-07-09 – 2023-07-11 (×3): 0.5 mg via ORAL
  Filled 2023-07-09 (×3): qty 1

## 2023-07-09 NOTE — Progress Notes (Signed)
Pt is complaining of worsening abdominal pressure. Provider notified.

## 2023-07-09 NOTE — Progress Notes (Signed)
Mobility Specialist: Progress Note   07/09/23 1608  Mobility  Activity Ambulated with assistance in hallway  Level of Assistance Standby assist, set-up cues, supervision of patient - no hands on  Assistive Device Other (Comment) (IV pole)  Distance Ambulated (ft) 300 ft  Activity Response Tolerated well  Mobility Referral Yes  $Mobility charge 1 Mobility  Mobility Specialist Start Time (ACUTE ONLY) 1109  Mobility Specialist Stop Time (ACUTE ONLY) 1116  Mobility Specialist Time Calculation (min) (ACUTE ONLY) 7 min    Pt was agreeable to mobility session - received in bed. SV throughout. No complaints until towards EOS where she got hit with a sense of dizziness - required CG while she regained composure and balance. Continued remaining ambulation with close SV. Denies feeling dizziness or lightheadedness prior to or after that moment. Returned to room without fault. Left in bed with all needs met, call bell in reach.   Maurene Capes Mobility Specialist Please contact via SecureChat or Rehab office at [phone]

## 2023-07-09 NOTE — Progress Notes (Addendum)
21 Days Post-Op   Subjective/Chief Complaint: Reports diarrhea last night - about 6 episodes of semi-solid stool. Denies watery stool. Reports nausea around 7:30 PM so her g tube was placed to gravity. Has been re-clamped this AM.  Objective: Vital signs in last 24 hours: Temp:  [98 F (36.7 C)-98.8 F (37.1 C)] 98.1 F (36.7 C) (10/23 0746) Pulse Rate:  [91-104] 91 (10/23 0746) Resp:  [16-22] 16 (10/23 0746) BP: (105-129)/(54-66) 110/56 (10/23 0746) SpO2:  [97 %-100 %] 97 % (10/23 0746) Weight:  [49.2 kg] 49.2 kg (10/23 0457) Last BM Date : 07/05/23 (G tube)  Intake/Output from previous day: 10/22 0701 - 10/23 0700 In: 780 [P.O.:780] Out: 100 [Drains:100] Intake/Output this shift: No intake/output data recorded.  Alert, nad, nontoxic Nonlabored G-tube intact. J tube in place and dressing with minimal leakage.  G tube gravity bag with 300 mL brown effluent.   Lab Results:  Recent Labs    07/07/23 0432  WBC 9.5  HGB 9.8*  HCT 31.7*  PLT 476*    BMET Recent Labs    07/07/23 0432  NA 136  K 4.1  CL 100  CO2 27  GLUCOSE 129*  BUN 19  CREATININE 0.43*  CALCIUM 8.1*     PT/INR No results for input(s): "LABPROT", "INR" in the last 72 hours. ABG No results for input(s): "PHART", "HCO3" in the last 72 hours.  Invalid input(s): "PCO2", "PO2"  Studies/Results: IR REPLACE GASTRO/JEJUNO TUBE PERC W/FLUORO  Result Date: 07/07/2023 INDICATION: Patient with history of laparotomy, distal gastrectomy with Roux-en-Y and gastrostomy and jejunostomy tube placement for chronic gastric outlet obstruction found to have her G-tube malpositioned. Patient presents for gastrostomy tube placement. EXAM: PUSH GASTROSTOMY TUBE PLACEMENT COMPARISON:  None Available. MEDICATIONS: No; Antibiotics were administered within 1 hour of the procedure. CONTRAST:  Five mL of Isovue 300 administered into the gastric lumen. ANESTHESIA/SEDATION: Moderate (conscious) sedation was not employed  during this procedure FLUOROSCOPY TIME:  (1 mGy) COMPLICATIONS: None immediate. PROCEDURE: Informed written consent was obtained from the patient. Following explanation of the procedure, risks, benefits and alternatives. A time out was performed prior to the initiation of the procedure. Ultrasound scanning was performed to demarcate the edge of the left lobe of the liver. Maximal barrier sterile technique utilized including caps, mask, sterile gowns, sterile gloves, large sterile drape, hand hygiene and Betadine prep. The right upper quadrant was sterilely prepped and draped. Under intermittent fluoroscopic guidance, the place holder catheter. Ultimately allowing placement of a 24-French balloon retention gastrostomy tube. The retention balloon was insufflated with a mixture of dilute saline and pulled taut against the anterior wall of the stomach. The external disc was cinched. Contrast injection confirms positioning within the stomach. Spot radiographic images confirmed placement within the stomach. The patient tolerated procedure well without immediate post procedural complication. FINDINGS: After successful fluoroscopic guided placement, the gastrostomy tube is appropriately positioned with internal retention balloon against the ventral aspect of the gastric lumen. IMPRESSION: Successful fluoroscopic insertion of an 44 French balloon retention gastrostomy tube through a pre-existing gastrostomy tube tract. The tip of the new gastrostomy tube lies in the stomach. The gastrostomy tube is available to use at this time. Performed by Anders Grant NP Electronically Signed   By: Irish Lack M.D.   On: 07/07/2023 16:47    Anti-infectives: Anti-infectives (From admission, onward)    Start     Dose/Rate Route Frequency Ordered Stop   06/18/23 1115  cefoTEtan (CEFOTAN) 2 g in sodium chloride 0.9 %  100 mL IVPB        2 g 200 mL/hr over 30 Minutes Intravenous  Once 06/18/23 1107 06/18/23 1124   06/18/23  1103  sodium chloride 0.9 % with cefoTEtan (CEFOTAN) ADS Med       Note to Pharmacy: Payton Emerald A: cabinet override      06/18/23 1103 06/18/23 1133   06/06/23 0600  cefoTEtan (CEFOTAN) 2 g in sodium chloride 0.9 % 100 mL IVPB        2 g 200 mL/hr over 30 Minutes Intravenous On call to O.R. 06/06/23 0555 06/06/23 0818       Assessment/Plan: EXPLORATION LAPAROTOMY, INSERTION OF JEJUNAL FEEDING TUBE (N/A) Gastric outlet obstruction due to chronic peptic ulcer disease    POD#33 s/p Exploratory laparotomy, Distal gastrectomy with Roux-en-Y gastrojejunostomy, Placement of 24-French gastric tube, Placement of 18-French jejunostomy tube 9/20 Dr. Andrey Campanile POD #21 s/p Exploratory laparotomy with takedown and subsequent re-placement of jejunostomy tube (red rubber), lysis of adhesions by Dr. Cliffton Asters for sbo at J-J site  - Surgical path negative for H pylori and negative for malignancy - Cont PPI + -cont  tylenol/roxicodone with PRN dilaudid for pain. I decreased the frequency of diaudid and added BID oxy 5mg  PRN for breakthrough pain. - Cont scheduled Reglan and PRN anti-emetics.  - Tol TF's @ goal - appears to tolerating higher rate over 16hrs (cyclic). New TF formula being tolerated better.  - G tube fell out. Replaced by IR 10/21. CLD with 1.2L limit. avoid carbonated beverages.will keep G tube clamped continuously. - Mobilize, PT - no fu recommended  - J tube resecured at bedside the weekend of oct 1 - wound infection.  Opened 10/11.  Continue dressing changes.  PDS free in wound. - on scheduled reglan   ID - Cefotetan periop x 2. None currently.  FEN - cont J tube feeds at 26ml/hr x 16hr per day, adjustments per dietician. FLD; diarrhea overnight, check BMP, may need to increase banatrol. Transition meds to PO. Foley - out and voiding   Anxiety-cont prn Ativan   HTN - PRN meds while NPO Hx Seizures - Home meds Urinary hesitancy - UA negative. added flomax which helped  significantly.  Remove PICC once off dilaudid    LOS: 33 days    Adam Phenix PA-C 07/09/2023

## 2023-07-09 NOTE — Plan of Care (Signed)

## 2023-07-09 NOTE — Progress Notes (Signed)
Pt seen examined Doing well Some nausea this am  Converting J tube meds to oral Plan tentative home Friday with HHN -wound care and cyclic J tube feeds Pt will go home on FLD with protein shakes Start family teaching for wound and use of g and J tubes and connecting J tube to feeding pump  Plan will be to wean J tube feeds at home as oral intake improves at home  Mary Sella. Andrey Campanile, MD, FACS General, Bariatric, & Minimally Invasive Surgery [facility],  [facility]

## 2023-07-09 NOTE — Progress Notes (Signed)
Occupational Therapy Treatment Patient Details Name: Lindsay Frost MRN: [id number] DOB: [birth year] Today's Date: 07/09/2023   History of present illness 67 y.o. 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 10/2 S/P  s/p Exploratory laparotomy with takedown and subsequent re-placement of jejunostomy tube (red rubber), lysis of adhesions. PMH: back pain, cholecystectomy, gastric ulcer, depression.   OT comments  Patient agreeable to OT. Reviewed energy conservation strategies and provided handout.  Pt completing transfers and toileting with supervision due to IV pole mgmt.  Pt provided with green theraband and HEP for UE strengthening.  Messaged RN/NT about assisting pt with ADL routine in mornings.  Pt very close to OT dc, but pt is hesitant for OT to dc her today.  Will plan on full ADL next session and review HEP.       If plan is discharge home, recommend the following:  Assistance with cooking/housework;Assist for transportation   Equipment Recommendations  BSC/3in1    Recommendations for Other Services      Precautions / Restrictions Precautions Precautions: Fall Precaution Comments: abdominal surgery, foley, PEG, JG drain, mod fall Required Braces or Orthoses: Other Brace Other Brace: abdominal binder- pt refused 10/23 Restrictions Weight Bearing Restrictions: No       Mobility Bed Mobility Overal bed mobility: Modified Independent                  Transfers Overall transfer level: Modified independent                       Balance Overall balance assessment: Mild deficits observed, not formally tested                                         ADL either performed or assessed with clinical judgement   ADL Overall ADL's : Needs assistance/impaired                      Lower Body Dressing: Supervision/safety;Sit to/from stand   Toilet Transfer: Supervision/safety;Ambulation Toilet Transfer Details (indicate cue type and reason): support with IV pole Toileting- Clothing Manipulation and Hygiene: Supervision/safety;Sit to/from stand       Functional mobility during ADLs: Supervision/safety      Extremity/Trunk Assessment              Vision       Perception     Praxis      Cognition Arousal: Alert Behavior During Therapy: WFL for tasks assessed/performed Overall Cognitive Status: Within Functional Limits for tasks assessed                                 General Comments: some slow processing but overall appears functional        Exercises Exercises: General Upper Extremity General Exercises - Upper Extremity Shoulder Flexion: Strengthening, 10 reps, Seated, Theraband, Both Theraband Level (Shoulder Flexion): Level 3 (Green) Shoulder Extension: Strengthening, 10 reps, Theraband, Seated, Both Theraband Level (Shoulder Extension): Level 3 (Green) Shoulder Horizontal ABduction: Strengthening, Both, 10 reps, Seated, Theraband (with shoulder retraction) Theraband Level (Shoulder Horizontal Abduction): Level 3 (Green) Shoulder Horizontal ADduction: Strengthening, 10 reps, Both, Seated, Theraband (with shoulder retraction) Theraband Level (Shoulder Horizontal  Adduction): Level 3 (Green) Elbow Flexion: Strengthening, 10 reps, Both, Seated, Theraband Theraband Level (Elbow Flexion): Level 3 (Green) Elbow Extension: Strengthening, Both, 10 reps, Seated, Theraband Theraband Level (Elbow Extension): Level 3 (Green)    Shoulder Instructions       General Comments provided energy conservation handout, discussed recommendations for DC home    Pertinent Vitals/ Pain       Pain Assessment Pain Assessment: Faces Faces Pain Scale: Hurts a little bit Pain Location: abdomen and back Pain Descriptors /  Indicators: Discomfort Pain Intervention(s): Limited activity within patient's tolerance, Monitored during session, Repositioned  Home Living                                          Prior Functioning/Environment              Frequency  Min 1X/week        Progress Toward Goals  OT Goals(current goals can now be found in the care plan section)  Progress towards OT goals: Goals updated  Acute Rehab OT Goals Patient Stated Goal: get home OT Goal Formulation: With patient Time For Goal Achievement: 07/23/23 Potential to Achieve Goals: Good  Plan      Co-evaluation                 AM-PAC OT "6 Clicks" Daily Activity     Outcome Measure   Help from another person eating meals?: A Little (liquids only) Help from another person taking care of personal grooming?: A Little Help from another person toileting, which includes using toliet, bedpan, or urinal?: A Little Help from another person bathing (including washing, rinsing, drying)?: A Little Help from another person to put on and taking off regular upper body clothing?: A Little Help from another person to put on and taking off regular lower body clothing?: A Little 6 Click Score: 18    End of Session    OT Visit Diagnosis: Unsteadiness on feet (R26.81);Muscle weakness (generalized) (M62.81);Other symptoms and signs involving cognitive function   Activity Tolerance Patient tolerated treatment well   Patient Left in bed;with call bell/phone within reach   Nurse Communication Mobility status;Other (comment) (encouraged ADL routine in AM)        Time: 1136-1203 OT Time Calculation (min): 27 min  Charges: OT General Charges $OT Visit: 1 Visit OT Treatments $Self Care/Home Management : 8-22 mins $Therapeutic Exercise: 8-22 mins  Barry Brunner, OT Acute Rehabilitation Services Office [phone]   Chancy Milroy 07/09/2023, 1:54 PM

## 2023-07-09 NOTE — Progress Notes (Signed)
Spoke with PAC Simaan, ok to leave PICC in for one more day, attempt to wean pt off of IV pain meds and then plan for either d/c or PO pain management. Plan to not have IV access after weaned off IV pain meds.

## 2023-07-10 MED ORDER — VITAMIN D 25 MCG (1000 UNIT) PO TABS
1000.0000 [IU] | ORAL_TABLET | Freq: Every day | ORAL | Status: DC
  Administered 2023-07-11: 1000 [IU] via ORAL
  Filled 2023-07-10: qty 1

## 2023-07-10 MED ORDER — PANTOPRAZOLE SODIUM 40 MG PO TBEC
40.0000 mg | DELAYED_RELEASE_TABLET | Freq: Every day | ORAL | Status: DC
  Administered 2023-07-10: 40 mg via ORAL
  Filled 2023-07-10: qty 1

## 2023-07-10 MED ORDER — LEVETIRACETAM 500 MG PO TABS
1000.0000 mg | ORAL_TABLET | Freq: Two times a day (BID) | ORAL | Status: DC
  Administered 2023-07-10 – 2023-07-11 (×2): 1000 mg via ORAL
  Filled 2023-07-10 (×2): qty 2

## 2023-07-10 MED ORDER — HYDROMORPHONE HCL 1 MG/ML IJ SOLN
0.5000 mg | Freq: Two times a day (BID) | INTRAMUSCULAR | Status: DC | PRN
  Administered 2023-07-10 – 2023-07-11 (×2): 1 mg via INTRAVENOUS
  Filled 2023-07-10 (×2): qty 1

## 2023-07-10 MED ORDER — METOCLOPRAMIDE HCL 5 MG PO TABS
10.0000 mg | ORAL_TABLET | Freq: Three times a day (TID) | ORAL | Status: DC
  Administered 2023-07-10 – 2023-07-11 (×4): 10 mg via ORAL
  Filled 2023-07-10 (×5): qty 2

## 2023-07-10 NOTE — Progress Notes (Signed)
Physical Therapy Treatment Patient Details Name: Lindsay Frost MRN: [id number] DOB: [birth year] Today's Date: 07/10/2023   History of Present Illness 67 y.o. 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 10/2 S/P  s/p Exploratory laparotomy with takedown and subsequent re-placement of jejunostomy tube (red rubber), lysis of adhesions. PMH: back pain, cholecystectomy, gastric ulcer, depression.    PT Comments  Pt with big smile on her face on entry, reporting she is excited to be going home soon. RN and Research scientist (medical) pt on G tube management. Pt reports some apprehension about managing medication and tube feeds, PT assured her that she and her sister would be confident in G tube management prior to discharge home. Pt continues to be supervision for bed mobility and transfers and ambulation while pushing IV pole. Discussed pt was ambulating without AD prior to hospitalization. Pt tried ambulation independently with contact guard assist, vc for working on increasing BoS. Pt tires easily and returns to using IV pole for support. PT has asked Mobility Specialist to work on more independent ambulation. PT will continue to follow acutely.     If plan is discharge home, recommend the following: A little help with walking and/or transfers;A little help with bathing/dressing/bathroom;Assistance with cooking/housework;Assist for transportation   Can travel by private vehicle      Yes  Equipment Recommendations  BSC/3in1;Rolling walker (2 wheels)       Precautions / Restrictions Precautions Precautions: Fall Precaution Comments: abdominal surgery, foley, PEG, JG drain, mod fall Required Braces or Orthoses: Other Brace Other Brace: abdominal binder Restrictions Weight Bearing Restrictions: No     Mobility  Bed  Mobility Overal bed mobility: Needs Assistance Bed Mobility: Supine to Sit Rolling: Supervision Sidelying to sit: Supervision Supine to sit: Supervision, Used rails, HOB elevated   Sit to sidelying: Supervision General bed mobility comments: cueing to roll to protect abdomen    Transfers Overall transfer level: Needs assistance Equipment used: None Transfers: Sit to/from Stand Sit to Stand: Supervision           General transfer comment: for lines and safety    Ambulation/Gait Ambulation/Gait assistance: Contact guard assist, Supervision Gait Distance (Feet): 250 Feet Assistive device: IV Pole, None Gait Pattern/deviations: Step-through pattern, Decreased step length - right, Decreased step length - left, Narrow base of support, Trunk flexed Gait velocity: decreased Gait velocity interpretation: <1.8 ft/sec, indicate of risk for recurrent falls   General Gait Details: pt preference for pushing IV pole but worked on independent ambulation in preparation for discharge home in the next couple days, vc for increased BoS         Balance Overall balance assessment: Mild deficits observed, not formally tested                                          Cognition Arousal: Alert Behavior During Therapy: WFL for tasks assessed/performed Overall Cognitive Status: Within Functional Limits for tasks assessed                               Problem Solving: Slow processing, Requires verbal cues, Difficulty sequencing General Comments: pt with some concern about managing feeding tube and G tube with discharge home, discussed possible  need for DME at discharge, pt reports not needing it prior to hospitalization, PT points out that she has been using IV pole for support with ambulation           General Comments General comments (skin integrity, edema, etc.): discussed need for mobilization with going home and for improving activity tolerance       Pertinent Vitals/Pain Pain Assessment Pain Assessment: Faces Faces Pain Scale: Hurts a little bit Breathing: normal Negative Vocalization: none Facial Expression: smiling or inexpressive Body Language: relaxed Consolability: no need to console PAINAD Score: 0 Pain Location: abdomen and back Pain Descriptors / Indicators: Discomfort Pain Intervention(s): Limited activity within patient's tolerance, Monitored during session, Repositioned     PT Goals (current goals can now be found in the care plan section) Acute Rehab PT Goals PT Goal Formulation: With patient Time For Goal Achievement: 06/26/23 Potential to Achieve Goals: Good Progress towards PT goals: Progressing toward goals    Frequency    Min 1X/week       AM-PAC PT "6 Clicks" Mobility   Outcome Measure  Help needed turning from your back to your side while in a flat bed without using bedrails?: None Help needed moving from lying on your back to sitting on the side of a flat bed without using bedrails?: None Help needed moving to and from a bed to a chair (including a wheelchair)?: A Little Help needed standing up from a chair using your arms (e.g., wheelchair or bedside chair)?: None Help needed to walk in hospital room?: A Little Help needed climbing 3-5 steps with a railing? : A Little 6 Click Score: 21    End of Session Equipment Utilized During Treatment: Gait belt Activity Tolerance: Patient tolerated treatment well Patient left: in bed;with call bell/phone within reach;with bed alarm set Nurse Communication: Mobility status PT Visit Diagnosis: Other abnormalities of gait and mobility (R26.89);Muscle weakness (generalized) (M62.81);Pain;Difficulty in walking, not elsewhere classified (R26.2) Pain - part of body:  (abdomen and back)     Time: 5284-1324 PT Time Calculation (min) (ACUTE ONLY): 15 min  Charges:    $Gait Training: 8-22 mins PT General Charges $$ ACUTE PT VISIT: 1 Visit                      Lindsay Frost PT, DPT Acute Rehabilitation Services Please use secure chat or  Call Office [phone]    Lindsay Frost 07/10/2023, 11:00 AM

## 2023-07-10 NOTE — Progress Notes (Addendum)
22 Days Post-Op   Subjective/Chief Complaint: Reports some abdominal fullness/pressure last night followed by nausea. Her g tube was placed to gravity and J tube feeds stopped by on call provider overnight. She feels better today. She states she has been doing well overall, until fullness in the evening, and her pain is controlled. She is eager for discharge.   She states her sister will be helping her at home but has claustrophobia/cannot get on elevators and cannot walk up 6 flights of stairs. She also has a sister-in-law who is a Charity fundraiser so we discussed her sister-in-law coming to the hospital for tube feed teaching and she is going to call her family today to arrange a teaching session.  Objective: Vital signs in last 24 hours: Temp:  [97.7 F (36.5 C)-98.3 F (36.8 C)] 98.3 F (36.8 C) (10/24 0910) Pulse Rate:  [89-108] 95 (10/24 0910) Resp:  [17-18] 18 (10/24 0910) BP: (110-122)/(53-73) 122/60 (10/24 0910) SpO2:  [96 %-99 %] 97 % (10/24 0910) Weight:  [48.9 kg] 48.9 kg (10/24 0500) Last BM Date : 07/05/23 (G tube)  Intake/Output from previous day: No intake/output data recorded. Intake/Output this shift: No intake/output data recorded.  Alert, nad, nontoxic Nonlabored G-tube intact. J tube in place and dressing with minimal leakage.  G tube gravity bag with 500 mL brown drainage.   Lab Results:  No results for input(s): "WBC", "HGB", "HCT", "PLT" in the last 72 hours.   BMET Recent Labs    07/09/23 0955  NA 135  K 4.2  CL 104  CO2 25  GLUCOSE 91  BUN 16  CREATININE 0.31*  CALCIUM 7.6*     PT/INR No results for input(s): "LABPROT", "INR" in the last 72 hours. ABG No results for input(s): "PHART", "HCO3" in the last 72 hours.  Invalid input(s): "PCO2", "PO2"  Studies/Results: DG Abd Portable 1V  Result Date: 07/09/2023 CLINICAL DATA:  Feeding tube EXAM: PORTABLE ABDOMEN - 1 VIEW COMPARISON:  CT abdomen and pelvis 06/17/2023. Abdominal x-ray 07/07/2023.  FINDINGS: Surgical staple line seen in the left upper quadrant. Gastrostomy tube balloon is seen in this region. Second separate catheter overlies the mid abdomen. Bowel-gas pattern is nonobstructive. Cholecystectomy clips are present. No acute fractures. IMPRESSION: 1. Gastrostomy tube balloon is seen in the left upper quadrant. 2. Second separate catheter overlies the mid abdomen. Electronically Signed   By: Darliss Cheney M.D.   On: 07/09/2023 22:31    Anti-infectives: Anti-infectives (From admission, onward)    Start     Dose/Rate Route Frequency Ordered Stop   06/18/23 1115  cefoTEtan (CEFOTAN) 2 g in sodium chloride 0.9 % 100 mL IVPB        2 g 200 mL/hr over 30 Minutes Intravenous  Once 06/18/23 1107 06/18/23 1124   06/18/23 1103  sodium chloride 0.9 % with cefoTEtan (CEFOTAN) ADS Med       Note to Pharmacy: Payton Emerald A: cabinet override      06/18/23 1103 06/18/23 1133   06/06/23 0600  cefoTEtan (CEFOTAN) 2 g in sodium chloride 0.9 % 100 mL IVPB        2 g 200 mL/hr over 30 Minutes Intravenous On call to O.R. 06/06/23 0555 06/06/23 0818       Assessment/Plan: EXPLORATION LAPAROTOMY, INSERTION OF JEJUNAL FEEDING TUBE (N/A) Gastric outlet obstruction due to chronic peptic ulcer disease    POD#34 s/p Exploratory laparotomy, Distal gastrectomy with Roux-en-Y gastrojejunostomy, Placement of 24-French gastric tube, Placement of 18-French jejunostomy tube 9/20 Dr.  Wilson POD #22 s/p Exploratory laparotomy with takedown and subsequent re-placement of jejunostomy tube (red rubber), lysis of adhesions by Dr. Cliffton Asters for sbo at J-J site  - Surgical path negative for H pylori and negative for malignancy - Cont PPI + - PO pain meds. - Cont scheduled Reglan and PRN anti-emetics.  - Tol TF's @ goal - appears to be tolerating higher rate over 16hrs (cyclic). New TF formula being tolerated better.  - G tube fell out. Replaced by IR 10/21. avoid carbonated beverages. Keep G tube clamped  continuously. Vent PRN for nausea. - Mobilize, PT - no fu recommended  - J tube resecured at bedside the weekend of oct 1 - wound infection.  Opened 10/11.  Continue dressing changes.  PDS free in wound.  ID - Cefotetan periop x 2. None currently.  FEN - cont J tube feeds at 63ml/hr x 16hr per day, adjustments per dietician. FLD, D/C free water today.  Foley - out and voiding   Anxiety-cont prn Ativan   HTN - PRN meds while NPO Hx Seizures - Home meds Urinary hesitancy - UA negative. added flomax which helped significantly.  Remove PICC once off dilaudid. Dilaudid decreased to BID PRN today. Patient will be stable for discharge once she/her family have completed education and are able to vent the G Tube independently and performed necessary actions to do at home J tube feeds. I have also asked RN to teach family wet-to-dry dressing changes. Anticipate discharge 10/25 or 10/26.   LOS: 34 days    Adam Phenix PA-C 07/10/2023

## 2023-07-10 NOTE — Plan of Care (Signed)

## 2023-07-10 NOTE — Progress Notes (Signed)
   07/10/23 1533  Mobility  Activity Ambulated independently in hallway  Level of Assistance Standby assist, set-up cues, supervision of patient - no hands on (CG)  Assistive Device None  Distance Ambulated (ft) 150 ft  Activity Response Tolerated well  Mobility Referral Yes  $Mobility charge 1 Mobility  Mobility Specialist Start Time (ACUTE ONLY) 1521  Mobility Specialist Stop Time (ACUTE ONLY) 1532  Mobility Specialist Time Calculation (min) (ACUTE ONLY) 11 min   Mobility Specialist: Progress Note  Pt agreeable to mobility session - received in bed. Required SV throughout with occasional CG, pt drifting L to R with no AD.  Returned to bed with all needs met - call bell within reach.   Barnie Mort, BS Mobility Specialist Please contact via SecureChat or Rehab office at [phone].

## 2023-07-10 NOTE — TOC Progression Note (Addendum)
Transition of Care [facility]) - Progression Note    Patient Details  Name: Lindsay Frost MRN: [id number] Date of Birth: 1956/08/08  Transition of Care [facility]) CM/SW Contact  Nadene Rubins Adria Devon, RN Phone Number: 07/10/2023, 11:22 AM  Clinical Narrative:      Anticipated discharge date 1 to 2 days.   Spoke to patient at bedside.   Patient from home with sister .   Patient aware HHRN will not be able to visit daily due to insurance. Patient states sister will help her at home with wound care, J tube care, tube feedings and G tube care.   Tube feeding :  Jevity 1.5 at 90ml x12 hours (6p-6a)  Free water flushes q4h ( per day)  Provides 1700 kcal, 89g protein and free water daily  NUTRITION DIAGNOSIS:   Severe Malnutrition related to chronic illness (PUD, gastroparesis, esophagitis, duodenal bulb stenosis) as evidenced by severe fat depletion, severe muscle depletion.  - remains applicable    GOAL:   Patient will meet greater than or equal to 90% of their needs  - goal met via TF  Anticipated long term need    BID wet to dry dressing to open portion of midline abdominal wound    G tube to gravity at night, clamp during the day   Please change dressing around G,J tube BID and PRN saturation.  Please place Desitin around G,J tube each time you change the dressing to protect her skin   Bedside nurse will provide teaching on J tube, G tube and wound care.   Pam with [facility] will provide education on tube feedings.   Patient states her sister is not available to come to hospital today for teaching, however her sister in law is.   Patient's sister in law Steward Drone  is a Engineer, civil (consulting) and can come to hospital today for teaching on all of above . Brenda's phone number is [phone]   Pam with [facility] will call Steward Drone and schedule education today for around 4 pm if Steward Drone is available. Patient and bedside aware.   Kelly with [facility] accepted referral for [facility], however  start of care will not be until Monday. Patient, Pam and nurse aware.   NCM entered orders for PA to sign   NCM ordered 3 in1 with [facility] to come to room today.  Expected Discharge Plan: Home w Home Health Services Barriers to Discharge: Continued Medical Work up  Expected Discharge Plan and Services   Discharge Planning Services: CM Consult Post Acute Care Choice: Home Health, Durable Medical Equipment Living arrangements for the past 2 months: Apartment                 DME Arranged: 3-N-1                     Social Determinants of Health (SDOH) Interventions SDOH Screenings   Food Insecurity: No Food Insecurity (06/19/2023)  Housing: Low Risk  (06/19/2023)  Transportation Needs: No Transportation Needs (06/19/2023)  Utilities: Not At Risk (06/19/2023)  Alcohol Screen: Low Risk  (11/13/2022)  Depression (PHQ2-9): Low Risk  (02/18/2023)  Financial Resource Strain: Low Risk  (02/17/2023)  Physical Activity: Insufficiently Active (02/17/2023)  Social Connections: Socially Isolated (02/17/2023)  Stress: No Stress Concern Present (02/17/2023)  Tobacco Use: Low Risk  (06/18/2023)    Readmission Risk Interventions     No data to display

## 2023-07-11 ENCOUNTER — Other Ambulatory Visit (HOSPITAL_COMMUNITY): Payer: Self-pay

## 2023-07-11 DIAGNOSIS — Z903 Acquired absence of stomach [part of]: Secondary | ICD-10-CM | POA: Diagnosis not present

## 2023-07-11 MED ORDER — ONDANSETRON 4 MG PO TBDP
4.0000 mg | ORAL_TABLET | Freq: Four times a day (QID) | ORAL | 0 refills | Status: DC | PRN
  Filled 2023-07-11: qty 20, 5d supply, fill #0

## 2023-07-11 MED ORDER — TAB-A-VITE/IRON PO TABS: 1.0000 | ORAL_TABLET | Freq: Every day | ORAL | Status: DC

## 2023-07-11 MED ORDER — VITAMIN D3 25 MCG PO TABS: 1000.0000 [IU] | ORAL_TABLET | Freq: Every day | ORAL | Status: DC

## 2023-07-11 MED ORDER — LEVETIRACETAM 1000 MG PO TABS: 1000.0000 mg | ORAL_TABLET | Freq: Two times a day (BID) | ORAL | 0 refills | Status: DC

## 2023-07-11 MED ORDER — OXYCODONE HCL 5 MG PO TABS
5.0000 mg | ORAL_TABLET | ORAL | 0 refills | Status: DC | PRN
  Filled 2023-07-11: qty 25, 3d supply, fill #0

## 2023-07-11 MED ORDER — ZINC OXIDE 40 % EX OINT: TOPICAL_OINTMENT | Freq: Two times a day (BID) | CUTANEOUS | Status: DC

## 2023-07-11 MED ORDER — VITAMIN D3 25 MCG PO TABS: 1000.0000 [IU] | ORAL_TABLET | Freq: Every day | ORAL | 0 refills | Status: DC

## 2023-07-11 MED ORDER — PANTOPRAZOLE SODIUM 40 MG PO TBEC
40.0000 mg | DELAYED_RELEASE_TABLET | Freq: Every day | ORAL | 0 refills | Status: DC
  Filled 2023-07-11: qty 30, 30d supply, fill #0

## 2023-07-11 MED ORDER — LIDOCAINE 5 % EX PTCH
1.0000 | MEDICATED_PATCH | CUTANEOUS | 0 refills | Status: DC
  Filled 2023-07-11: qty 10, 10d supply, fill #0

## 2023-07-11 MED ORDER — ACETAMINOPHEN 500 MG PO TABS
1000.0000 mg | ORAL_TABLET | Freq: Four times a day (QID) | ORAL | 0 refills | Status: DC
  Filled 2023-07-11: qty 30, 4d supply, fill #0

## 2023-07-11 MED ORDER — METHOCARBAMOL 500 MG PO TABS
1000.0000 mg | ORAL_TABLET | Freq: Four times a day (QID) | ORAL | 0 refills | Status: DC | PRN
  Filled 2023-07-11: qty 80, 10d supply, fill #0

## 2023-07-11 MED ORDER — METOCLOPRAMIDE HCL 10 MG PO TABS
10.0000 mg | ORAL_TABLET | Freq: Three times a day (TID) | ORAL | 0 refills | Status: DC
  Filled 2023-07-11: qty 56, 14d supply, fill #0

## 2023-07-11 NOTE — Plan of Care (Signed)
  Problem: Pain Managment: Goal: General experience of comfort will improve Outcome: Progressing   Problem: Safety: Goal: Ability to remain free from injury will improve Outcome: Progressing   

## 2023-07-11 NOTE — Discharge Summary (Incomplete)
[facility] Discharge Summary   Patient ID: Lindsay Frost MRN: [id number] DOB/AGE: 04-18-1956 67 y.o.  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, 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 12/30/2022   Atherosclerotic heart disease of native coronary artery without angina pectoris 12/30/2022   Mixed conductive and sensorineural hearing loss of left ear with restricted hearing of right ear 10/10/2022   Recurrent serous otitis media of left ear 10/10/2022   Vertigo 10/10/2022   Bilateral hearing loss 07/15/2022   Headache 04/13/2021   Myringotomy tube status 02/27/2021   B12 deficiency 01/02/2021   Overactive bladder 01/02/2021   Angina pectoris (HCC) 07/25/2020   Mixed dyslipidemia 07/25/2020   Arthritis    Back pain    Heart murmur    Meningioma (HCC)    Duodenal stenosis    Hiatal hernia    Hypertension 05/14/2019   Weight loss 07/29/2018   Deviated septum 08/28/2016   Allergic rhinitis 07/16/2016   Nocturnal leg cramps 02/20/2015   Migraine without aura and without status migrainosus, not intractable 01/13/2015   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 Abdominal x-ray 07/07/2023. FINDINGS: Surgical staple line seen in the  left upper quadrant. Gastrostomy tube balloon is seen in this region. Second separate catheter overlies the mid abdomen. Bowel-gas pattern is nonobstructive. Cholecystectomy clips are present. No acute fractures. IMPRESSION: 1. Gastrostomy tube balloon is seen in the left upper quadrant. 2. Second separate catheter overlies the mid abdomen. Electronically Signed   By: Darliss Cheney M.D.   On: 07/09/2023 22:31    Procedures Dr. Andrey Campanile 06/06/23 -  1.  Exploratory laparotomy. 2.  Distal gastrectomy with Roux-en-Y gastrojejunostomy. 3.  Placement of 24-French gastric tube. 4.  Placement of 18-French jejunostomy tube.  PATHOLOGY --- FINAL MICROSCOPIC DIAGNOSIS:   A. 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       Segment of stomach with muscular hyperplasia in the muscularis  propria.      No H. pylori identified on HE.       Negative for dysplasia or malignancy.     Dr. Cliffton Asters 06/18/23 - Exploratory laparotomy with takedown and subsequent re-placement of jejunostomy tube, lysis of adhesions    HPI: Lindsay Frost is an 67 y.o. 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  She denies any medical changes since I saw her in the clinic in May.  She states that she is gained a little bit of weight.  She is vomiting about once a week now.  She is mainly doing a liquid diet.  Occasionally she will try to eat some solid  food.  She did have a bad episode of bronchitis in the summer but that is resolved.  No shortness of breath.  Having a bowel movement every few days.   Old hpi: She has multiple issues in her foregut. She has a small hiatal hernia, duodenal diverticulum, duodenal bulb stenosis which  has required dilation, esophagitis and GERD.  I actually met her in February 2021 to discuss her reflux and foregut symptoms. She states that her reflux and heartburn has continued to worsen. It is definitely worse over the past 6 months she states. She describes a burning in her chest. She reports vomiting food a few times a month. She sleeps elevated. She takes Nexium twice a day along with famotidine twice a day. She states that it really does not seem like the medications are working however she takes them religiously. She also takes Carafate pills. She has a bowel movement about every 3 days which is her baseline. She reports getting full on small amounts of food. She also has nausea. No bloating. No sensation of anything getting stuck when eating or drinking. She does have some occasional pain with swallowing solid foods which she describes as a burning in her esophagus. Only prior abdominal surgery has been cholecystectomy by Dr. Doylene Canard.   Hospital Course:  She was admitted and underwent the above operation by Dr Andrey Campanile. Surgical pathology was negative for h pylori and negative for malignancy (see above). She was admitted to the floor with her G tube to gravity. She received PPI and carafate post-operatively.  J-tube feeds were started on POD#2.  Reglan was scheduled on POD#4. Once bowel function returned J tube feeds were gradually advanced to goal. Due to leakage around her G tube, a tube study was ordered on 9/30 which demonstrated G tube in stomach. 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 No evidence of vascular compromise." Due to these findings, she was taken for exploration on 10/2 by Dr. Cliffton Asters where all the bowel was noted to be viable there was an obstruction near the J-J that was corrected with takedown and replacement  of the J tube. J-tube feeds were resumed on 10/3 and gradually advanced to goal, then cyclic  tube feeds initiated (16 hr cycle). G-tube clamp trials were started. UGI on 10/11 showed a patent anastomosis. A midline wound infection was also noted on 10/11 and was opened up. There is some PDS free in the wound. The patients G tube was noted to be dislodged on 10/21, a foley was placed at the bedside and then a G tube was replaced by IR same day. The patient continued to progress with G tube clamping trials. On the day of discharge she was tolerating her G tube clamped from 7 AM until 7-8 PM. Her meds were transitioned to PO. On 07/11/23 her vitals were stable, pain controlled, mobilizing with a walker, and stable for discharge home. She will be discharged with [facility] RN for tube feeds and wound care. She will have the help of her sister and siter-in-law at discharge. Follow up as below. She required a PICC line during her admission due to difficult access. PICC removed on the day of discharge.  Physical Exam: Alert, NAD, nontoxic Nonlabored G-tube intact. J tube in place and dressing with minimal leakage.  G tube gravity bag  Allergies as of  07/11/2023       Reactions   Naratriptan    Ineffective        Medication List     STOP taking these medications    aluminum-magnesium hydroxide-simethicone 200-200-20 MG/5ML Susp Commonly known as: MAALOX   amLODipine 5 MG tablet Commonly known as: NORVASC   BC HEADACHE PO   dicyclomine 10 MG capsule Commonly known as: BENTYL   esomeprazole 40 MG capsule Commonly known as: NEXIUM   famotidine 40 MG tablet Commonly known as: PEPCID   rifaximin 550 MG Tabs tablet Commonly known as: [name]   sucralfate 1 g tablet Commonly known as: CARAFATE   topiramate 100 MG tablet Commonly known as: TOPAMAX       TAKE these medications    Acetaminophen Extra Strength 500 MG Tabs Take 2 tablets (1,000 mg total) by mouth 4 (four) times daily.    albuterol 108 (90 Base) MCG/ACT inhaler Commonly known as: VENTOLIN HFA Inhale 2 puffs into the lungs every 6 (six) hours as needed for wheezing or shortness of breath.   alendronate 70 MG tablet Commonly known as: FOSAMAX Take 1 tablet (70 mg total) by mouth every 7 (seven) days. Take with a full glass of water on an empty stomach.   aspirin EC 81 MG tablet Take 1 tablet (81 mg total) by mouth daily. Swallow whole.   atorvastatin 20 MG tablet Commonly known as: LIPITOR TAKE 1 TABLET(20 MG) BY MOUTH DAILY   eletriptan 40 MG tablet Commonly known as: RELPAX Take 1 tablet (40 mg total) by mouth as needed for migraine or headache. May repeat in 2 hours if headache persists or recurs.   fluticasone 50 MCG/ACT nasal spray Commonly known as: FLONASE SHAKE LIQUID AND USE 2 SPRAYS IN EACH NOSTRIL DAILY   levETIRAcetam 500 MG tablet Commonly known as: KEPPRA Take 1 tablet twice daily for one week, then increase to 2 tablets twice daily.   lidocaine 5 % Commonly known as: LIDODERM Place 1 patch onto the skin daily. Remove & Discard patch within 12 hours or as directed by MD   liver oil-zinc oxide 40 % ointment Commonly known as: DESITIN Apply topically 2 (two) times daily.   methocarbamol 500 MG tablet Commonly known as: ROBAXIN Take 2 tablets (1,000 mg total) by mouth every 6 (six) hours as needed for muscle spasms. What changed: how much to take   metoCLOPramide 10 MG tablet Commonly known as: REGLAN Take 1 tablet (10 mg total) by mouth 4 (four) times daily -  before meals and at bedtime for 14 days. What changed:  medication strength See the new instructions.   mirabegron ER 25 MG Tb24 tablet Commonly known as: Myrbetriq TAKE 1 TABLET(25 MG) BY MOUTH DAILY   multivitamins with iron Tabs tablet Take 1 tablet by mouth daily.   nitroGLYCERIN 0.4 MG SL tablet Commonly known as: NITROSTAT Place 0.4 mg under the tongue every 5 (five) minutes as needed for chest pain.    ondansetron 4 MG disintegrating tablet Commonly known as: ZOFRAN-ODT Take 1 tablet (4 mg total) by mouth every 6 (six) hours as needed for nausea.   oxyCODONE 5 MG immediate release tablet Commonly known as: Oxy IR/ROXICODONE Take 1-2 tablets (5-10 mg total) by mouth every 4 (four) hours as needed for moderate pain (pain score 4-6) or severe pain (pain score 7-10) (5 moderate, 10 severe).   pantoprazole 40 MG tablet Commonly known as: PROTONIX Take 1 tablet (40 mg total) by mouth at bedtime.  Restasis 0.05 % ophthalmic emulsion Generic drug: cycloSPORINE Place 1 drop into both eyes 2 (two) times daily.   vitamin D3 25 MCG tablet Commonly known as: CHOLECALCIFEROL Take 1 tablet (1,000 Units total) by mouth daily.               Durable Medical Equipment  (From admission, onward)           Start     Ordered   07/10/23 1114  For home use only DME Tube feeding  Once       Comments: Jevity 1.5 at 90ml x12 hours (6p-6a) Free water flushes q4h ( per day) Provides 1700 kcal, 89g protein and free water daily NUTRITION DIAGNOSIS:   Severe Malnutrition related to chronic illness (PUD, gastroparesis, esophagitis, duodenal bulb stenosis) as evidenced by severe fat depletion, severe muscle depletion. - remains applicable   GOAL:   Patient will meet greater than or equal to 90% of their needs - goal met via TF  Anticipated long term need   07/10/23 1115   07/10/23 1107  For home use only DME Tube feeding pump  Once       Question:  Length of Need  Answer:  Lifetime   07/10/23 1113   06/11/23 1336  For home use only DME 3 n 1  Once        06/11/23 1335   06/11/23 1225  For home use only DME 3 n 1  Once        06/11/23 1224              Follow-up Information     [facility] Follow up.   Specialty: [facility] Contact information: [address] [place] [place] [address] [phone]         [facility] Follow up.    Why: phone [phone]        Gaynelle Adu, MD Follow up.   Specialty: General Surgery Why: call to confirm your follow up appointment with Dr. Lanier Prude information: [address] [place] [address] [phone]                 Signed: Hosie Spangle, [facility] 07/11/2023, 12:04 PM

## 2023-07-11 NOTE — Progress Notes (Addendum)
Nutrition Follow-up  DOCUMENTATION CODES:   Severe malnutrition in context of chronic illness, Underweight  INTERVENTION:   Continue TF via J-tube: Jevity 1.5 at 90ml x12 hours (6p-6a) Free water flushes q4h ( per day) 60ml ProSouce TF once daily  Provides 1700 kcal, 89g protein and free water daily  2. Provided high calorie/high protein food options on a liquid diet to increase calories.    3. Continue liquid multivitamin with minerals BID per tube to help meet micronutrient needs in setting of anatomy and risk for decreased absorption.  NUTRITION DIAGNOSIS:   Severe Malnutrition related to chronic illness (PUD, gastroparesis, esophagitis, duodenal bulb stenosis) as evidenced by severe fat depletion, severe muscle depletion.  - Ongoing, but being addressed by nutritional supplements and TF   GOAL:   Patient will meet greater than or equal to 90% of their needs  - Meeting goal via TF  MONITOR:   TF tolerance, Labs, Weight trends  REASON FOR ASSESSMENT:   Consult Enteral/tube feeding initiation and management (resume trickle tube feeds)  ASSESSMENT:   67 yo female admitted with GOO d/t chronic PUD. S/P distal gastrectomy with roux en y gastrojejunostomy and placement of gastric and jejunal feeding tubes 9/20. PMH includes PUD, gastroparesis, duodenal diverticulum, duodenal bulb stenosis, esophagitis, GERD, hiatal hernia, gallstones, meningioma, HTN, seizures, B-12 deficiency.  9/20: s/p exlap, distal gastrectomy with roux-en-y gastrojejunostomy, placement of 24 Fr. G-tube and 18 Fr. J-tube 9/22: trickle TF initiated 9/25: s/p abd xray-no bowel obstruction; TF advanced to 43ml/h  10/2: exlap with takedown, re-placement of J-tube (red rubber tube), lysis of adhesions for SBO at J-J site 10/8: tube feeds condensed to be over 20 hours 10/9: condensing tube feeds over 16 hours  10/12: TF adjusted to 12 hour feeds 10/20: G-tube clamping trails 10/21: G  tube replaced by IR d/t falling out 10/23: Diet upgraded to full liquid  10:25: Pt to discharge today  Pt getting ready for discharge at visit, sister at bedside. Pt states having poor PO intake and only eating jello, skim milk, and Boost Breeze since transitioning to a full liquid diet on 10/23. Pt reports not liking the food in the hospital and will probably eat better outside of the hospital. Pt did not eat her breakfast upon visit. Provided high protein, high calories options on a liquid diet such as milkshakes and protein shakes to help increase calories. Emphasized the importance of trying to increase her PO intake even though her tube is providing adequate nutrition.   J tube feeds stopped overnight due to patient experiencing nausea. Explained to sister it is ok to stop her feeds if she feels nauseas but to make sure to extend her time on the feeds by however much time was lost to make sure she is still getting her full volume of feeds. Sister reports increasingly better BM.   Patient at highest weight during admission at 49.9 kg.   Admit weight: 47.6 kg Current weight: 49.9 kg   Nutritionally Relevant Medications: Scheduled Meds:  acetaminophen  1,000 mg Oral QID   Chlorhexidine Gluconate Cloth  6 each Topical Daily   cholecalciferol  1,000 Units Oral Daily   cycloSPORINE  1 drop Both Eyes BID   enoxaparin (LOVENOX) injection  30 mg Subcutaneous Q24H   feeding supplement  1 Container Oral BID BM   feeding supplement (JEVITY 1.5 CAL/FIBER)  1,080 mL Per Tube Q24H   feeding supplement (PROSource TF20)  60 mL Per Tube Daily   fiber supplement (  BANATROL TF)  60 mL Per Tube BID   levETIRAcetam  1,000 mg Oral BID   lidocaine  1 patch Transdermal Q24H   liver oil-zinc oxide   Topical BID   methocarbamol  1,000 mg Oral QID   metoCLOPramide  10 mg Oral TID AC & HS   multivitamins with iron  1 tablet Oral Daily   pantoprazole  40 mg Oral QHS   tamsulosin  0.4 mg Oral Daily    Continuous Infusions:  sodium chloride Stopped (06/28/23 2010)   PRN Meds:.sodium chloride, albuterol, diphenhydrAMINE **OR** diphenhydrAMINE, hydrALAZINE, HYDROmorphone (DILAUDID) injection, LORazepam, ondansetron **OR** ondansetron (ZOFRAN) IV, oxyCODONE, oxyCODONE, phenol, prochlorperazine, sodium chloride flush  Labs Reviewed: Creatinine: 0.31 Calcium: 7.6 Vitamin D 26.41  Diet Order:   Diet Order             Diet full liquid Fluid consistency: Thin  Diet effective now                   EDUCATION NEEDS:   Education needs have been addressed  Skin:  Skin Assessment: Skin Integrity Issues: Skin Integrity Issues:: Incisions Incisions: surgical incision to abdomen  Last BM:  10/23 (type 6)  Height:   Ht Readings from Last 1 Encounters:  06/18/23 5\' 4"  (1.626 m)    Weight:   Wt Readings from Last 1 Encounters:  07/11/23 49.9 kg    Ideal Body Weight:  54.5 kg  BMI:  Body mass index is 18.88 kg/m.  Estimated Nutritional Needs:   Kcal:  1650-1850  Protein:  70-90 gm  Fluid:  1.7-1.9 L   Elliot Dally, RD Registered Dietitian  See Amion for more information

## 2023-07-11 NOTE — Discharge Instructions (Signed)
MIDLINE WOUND CARE: - midline dressing to be changed twice daily - supplies: saline, kerlix/gauze, scissors, ABD pads, tape  - remove dressing and all packing carefully - clean edges of skin around the wound with water/gauze, making sure there is no tape debris or leakage left on skin that could cause skin irritation or breakdown. - dampen a clean kerlix with saline and pack wound from wound base to skin level, making sure to take note of any possible areas of wound tracking, tunneling and packing appropriately. Wound can be packed loosely. Trim kerlix to size if a whole kerlix is not required. - cover wound with a dry ABD pad and secure with tape.  - write the date/time on the dry dressing/tape to better track when the last dressing change occurred. - apply any skin protectant/powder recommended by clinician to protect skin/skin folds (destin around feeding tube) - change dressing as needed if leakage occurs, wound gets contaminated, or patient requests to shower. - patient may shower daily with wound open and following the shower the wound should be dried and a clean dressing placed.       [facility]      [facility], [place] [phone]  OPEN ABDOMINAL SURGERY: POST OP INSTRUCTIONS  Always review your discharge instruction sheet given to you by the facility where your surgery was performed.  IF YOU HAVE DISABILITY OR FAMILY LEAVE FORMS, YOU MUST BRING THEM TO THE OFFICE FOR PROCESSING.  PLEASE DO NOT GIVE THEM TO YOUR DOCTOR.  A prescription for pain medication may be given to you upon discharge.  Take your pain medication as prescribed, if needed.  If narcotic pain medicine is not needed, then you may take acetaminophen (Tylenol) or ibuprofen (Advil) as needed. Take your usually prescribed medications unless otherwise directed. If you need a refill on your pain medication, please contact your pharmacy. They will contact our office to request authorization.  Prescriptions will not be  filled after 5pm or on week-ends. You should follow a light diet the first few days after arrival home, such as soup and crackers, pudding, etc.unless your doctor has advised otherwise. A high-fiber, low fat diet can be resumed as tolerated.   Be sure to include lots of fluids daily. Most patients will experience some swelling and bruising on the chest and neck area.  Ice packs will help.  Swelling and bruising can take several days to resolve Most patients will experience some swelling and bruising in the area of the incision. Ice pack will help. Swelling and bruising can take several days to resolve..  It is common to experience some constipation if taking pain medication after surgery.  Increasing fluid intake and taking a stool softener will usually help or prevent this problem from occurring.  A mild laxative (Milk of Magnesia or Miralax) should be taken according to package directions if there are no bowel movements after 48 hours.  You may have steri-strips (small skin tapes) in place directly over the incision.  These strips should be left on the skin for 7-10 days.  If your surgeon used skin glue on the incision, you may shower in 24 hours.  The glue will flake off over the next 2-3 weeks.  Any sutures or staples will be removed at the office during your follow-up visit. You may find that a light gauze bandage over your incision may keep your staples from being rubbed or pulled. You may shower and replace the bandage daily. ACTIVITIES:  You may resume regular (light)  daily activities beginning the next day--such as daily self-care, walking, climbing stairs--gradually increasing activities as tolerated.  You may have sexual intercourse when it is comfortable.  Refrain from any heavy lifting or straining until approved by your doctor. You may drive when you no longer are taking prescription pain medication, you can comfortably wear a seatbelt, and you can safely maneuver your car and apply  brakes Return to Work: ___________________________________ Bonita Quin should see your doctor in the office for a follow-up appointment approximately two weeks after your surgery.  Make sure that you call for this appointment within a day or two after you arrive home to insure a convenient appointment time. OTHER INSTRUCTIONS:  _____________________________________________________________ _____________________________________________________________  WHEN TO CALL YOUR DOCTOR: Fever over 101.0 Inability to urinate Nausea and/or vomiting Extreme swelling or bruising Continued bleeding from incision. Increased pain, redness, or drainage from the incision. Difficulty swallowing or breathing Muscle cramping or spasms. Numbness or tingling in hands or feet or around lips.  The clinic staff is available to answer your questions during regular business hours.  Please don't hesitate to call and ask to speak to one of the nurses if you have concerns.  For further questions, please visit [email or web address]

## 2023-07-11 NOTE — TOC Progression Note (Signed)
Transition of Care (TOC) - Progression Note   Spoke to patient and Steward Drone at bedside. Steward Drone confirms wound education, J tube, tube feeds, G tube was done and she feels comfortable.   Plan for discharge today. Pam with [facility] aware. Tube feeding will be shipped to patients address this afternoon.   First home health visit with [facility] will be Monday July 14, 2023 . Patient and Steward Drone aware.   Spoke to bedside nurse , she will provide some dressing supplies , G tube drainage bag, etc .   Home 3 in 1 is at bedside.    Patient Details  Name: Lindsay Frost MRN: [id number] Date of Birth: 1956/06/24  Transition of Care [facility]) CM/SW Contact  Rabab Currington, Adria Devon, RN Phone Number: 07/11/2023, 8:26 AM  Clinical Narrative:       Expected Discharge Plan: Home w Home Health Services Barriers to Discharge: Continued Medical Work up  Expected Discharge Plan and Services   Discharge Planning Services: CM Consult Post Acute Care Choice: Home Health, Durable Medical Equipment Living arrangements for the past 2 months: Apartment                 DME Arranged: 3-N-1                     Social Determinants of Health (SDOH) Interventions SDOH Screenings   Food Insecurity: No Food Insecurity (06/19/2023)  Housing: Low Risk  (06/19/2023)  Transportation Needs: No Transportation Needs (06/19/2023)  Utilities: Not At Risk (06/19/2023)  Alcohol Screen: Low Risk  (11/13/2022)  Depression (PHQ2-9): Low Risk  (02/18/2023)  Financial Resource Strain: Low Risk  (02/17/2023)  Physical Activity: Insufficiently Active (02/17/2023)  Social Connections: Socially Isolated (02/17/2023)  Stress: No Stress Concern Present (02/17/2023)  Tobacco Use: Low Risk  (06/18/2023)    Readmission Risk Interventions     No data to display

## 2023-07-14 DIAGNOSIS — E43 Unspecified severe protein-calorie malnutrition: Secondary | ICD-10-CM | POA: Diagnosis not present

## 2023-07-14 DIAGNOSIS — E538 Deficiency of other specified B group vitamins: Secondary | ICD-10-CM | POA: Diagnosis not present

## 2023-07-14 DIAGNOSIS — J309 Allergic rhinitis, unspecified: Secondary | ICD-10-CM | POA: Diagnosis not present

## 2023-07-14 DIAGNOSIS — E782 Mixed hyperlipidemia: Secondary | ICD-10-CM | POA: Diagnosis not present

## 2023-07-14 DIAGNOSIS — K449 Diaphragmatic hernia without obstruction or gangrene: Secondary | ICD-10-CM | POA: Diagnosis not present

## 2023-07-14 DIAGNOSIS — R3911 Hesitancy of micturition: Secondary | ICD-10-CM | POA: Diagnosis not present

## 2023-07-14 DIAGNOSIS — I2510 Atherosclerotic heart disease of native coronary artery without angina pectoris: Secondary | ICD-10-CM | POA: Diagnosis not present

## 2023-07-14 DIAGNOSIS — J984 Other disorders of lung: Secondary | ICD-10-CM | POA: Diagnosis not present

## 2023-07-14 DIAGNOSIS — Z434 Encounter for attention to other artificial openings of digestive tract: Secondary | ICD-10-CM | POA: Diagnosis not present

## 2023-07-14 DIAGNOSIS — M549 Dorsalgia, unspecified: Secondary | ICD-10-CM | POA: Diagnosis not present

## 2023-07-14 DIAGNOSIS — Z431 Encounter for attention to gastrostomy: Secondary | ICD-10-CM | POA: Diagnosis not present

## 2023-07-14 DIAGNOSIS — G43009 Migraine without aura, not intractable, without status migrainosus: Secondary | ICD-10-CM | POA: Diagnosis not present

## 2023-07-14 DIAGNOSIS — I10 Essential (primary) hypertension: Secondary | ICD-10-CM | POA: Diagnosis not present

## 2023-07-14 DIAGNOSIS — N3281 Overactive bladder: Secondary | ICD-10-CM | POA: Diagnosis not present

## 2023-07-14 DIAGNOSIS — G8929 Other chronic pain: Secondary | ICD-10-CM | POA: Diagnosis not present

## 2023-07-14 DIAGNOSIS — Z48815 Encounter for surgical aftercare following surgery on the digestive system: Secondary | ICD-10-CM | POA: Diagnosis not present

## 2023-07-14 DIAGNOSIS — H90A32 Mixed conductive and sensorineural hearing loss, unilateral, left ear with restricted hearing on the contralateral side: Secondary | ICD-10-CM | POA: Diagnosis not present

## 2023-07-14 DIAGNOSIS — M1990 Unspecified osteoarthritis, unspecified site: Secondary | ICD-10-CM | POA: Diagnosis not present

## 2023-07-14 DIAGNOSIS — D649 Anemia, unspecified: Secondary | ICD-10-CM | POA: Diagnosis not present

## 2023-07-14 DIAGNOSIS — K2100 Gastro-esophageal reflux disease with esophagitis, without bleeding: Secondary | ICD-10-CM | POA: Diagnosis not present

## 2023-07-15 ENCOUNTER — Encounter: Payer: Self-pay | Admitting: Family

## 2023-07-16 ENCOUNTER — Ambulatory Visit (INDEPENDENT_AMBULATORY_CARE_PROVIDER_SITE_OTHER): Payer: 59 | Admitting: Family

## 2023-07-16 DIAGNOSIS — D649 Anemia, unspecified: Secondary | ICD-10-CM | POA: Diagnosis not present

## 2023-07-16 DIAGNOSIS — Z903 Acquired absence of stomach [part of]: Secondary | ICD-10-CM | POA: Diagnosis not present

## 2023-07-16 DIAGNOSIS — F418 Other specified anxiety disorders: Secondary | ICD-10-CM

## 2023-07-16 LAB — CBC WITH DIFFERENTIAL/PLATELET
Basophils Absolute: 0.1 10*3/uL (ref 0.0–0.1)
Basophils Relative: 0.9 % (ref 0.0–3.0)
Eosinophils Absolute: 0.2 10*3/uL (ref 0.0–0.7)
Eosinophils Relative: 2 % (ref 0.0–5.0)
HCT: 38.5 % (ref 36.0–46.0)
Hemoglobin: 11.8 g/dL — ABNORMAL LOW (ref 12.0–15.0)
Lymphocytes Relative: 17.5 % (ref 12.0–46.0)
Lymphs Abs: 1.4 10*3/uL (ref 0.7–4.0)
MCHC: 30.8 g/dL (ref 30.0–36.0)
MCV: 99.3 fL (ref 78.0–100.0)
Monocytes Absolute: 0.9 10*3/uL (ref 0.1–1.0)
Monocytes Relative: 11 % (ref 3.0–12.0)
Neutro Abs: 5.3 10*3/uL (ref 1.4–7.7)
Neutrophils Relative %: 68.6 % (ref 43.0–77.0)
Platelets: 470 10*3/uL — ABNORMAL HIGH (ref 150.0–400.0)
RBC: 3.87 Mil/uL (ref 3.87–5.11)
RDW: 19.3 % — ABNORMAL HIGH (ref 11.5–15.5)
WBC: 7.8 10*3/uL (ref 4.0–10.5)

## 2023-07-16 LAB — COMPREHENSIVE METABOLIC PANEL
ALT: 16 U/L (ref 0–35)
AST: 18 U/L (ref 0–37)
Albumin: 3.2 g/dL — ABNORMAL LOW (ref 3.5–5.2)
Alkaline Phosphatase: 87 U/L (ref 39–117)
BUN: 17 mg/dL (ref 6–23)
CO2: 32 meq/L (ref 19–32)
Calcium: 8.8 mg/dL (ref 8.4–10.5)
Chloride: 102 meq/L (ref 96–112)
Creatinine, Ser: 0.6 mg/dL (ref 0.40–1.20)
GFR: 93.1 mL/min (ref >60.00)
Glucose, Bld: 89 mg/dL (ref 70–99)
Potassium: 4.8 meq/L (ref 3.5–5.1)
Sodium: 141 meq/L (ref 135–145)
Total Bilirubin: 0.3 mg/dL (ref 0.2–1.2)
Total Protein: 5.9 g/dL — ABNORMAL LOW (ref 6.0–8.3)

## 2023-07-16 MED ORDER — SERTRALINE HCL 50 MG PO TABS: ORAL_TABLET | ORAL | 3 refills | Status: DC

## 2023-07-16 MED ORDER — ALPRAZOLAM 0.5 MG PO TABS
0.5000 mg | ORAL_TABLET | Freq: Two times a day (BID) | ORAL | 0 refills | Status: DC | PRN
Start: 2023-07-16 — End: 2023-08-20

## 2023-07-16 NOTE — Assessment & Plan Note (Signed)
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 Currently on a liquid diet and moving bowels.   -Follow up with surgeon, Dr. Andrey Campanile, next Friday. -Check calcium and hemoglobin levels today to monitor for ongoing anemia and potential hypocalcemia.

## 2023-07-16 NOTE — Patient Instructions (Signed)
VISIT SUMMARY:  You recently had a distal gastrectomy, which was complicated by a feeding tube placement issue, requiring a second surgery and extending your hospital stay to 37 days. Since your discharge, you have been experiencing worsening depression and anxiety. You are currently on a liquid diet and scheduled for a follow-up with your surgeon.  YOUR PLAN:  -POST-OPERATIVE RECOVERY FROM DISTAL GASTRECTOMY: You are recovering from a distal gastrectomy, a surgery where part of your stomach was removed. This was complicated by issues with your feeding tube, leading to a longer hospital stay. You are currently on a liquid diet and will follow up with your surgeon, Dr. Andrey Campanile, next Friday. We will also check your calcium and hemoglobin levels today to monitor for anemia and low calcium levels.  -DEPRESSION AND ANXIETY: You have been experiencing significant anxiety and depression since your recent hospitalization. Depression and anxiety are mental health conditions that can cause feelings of sadness, worry, and difficulty sleeping. We will start you on Sertraline, beginning with a half tablet at bedtime for a week, then increasing to a full tablet if tolerated. You can also use Xanax twice daily as needed for acute anxiety episodes. We will schedule a follow-up in one month to assess your response to the treatment.  -NUTRITION: You are currently on a liquid diet, which includes Boost and protein shakes. Please continue with your current nutritional plan.  INSTRUCTIONS:  Follow up with your surgeon, Dr. Andrey Campanile, next Friday. We will also check your calcium and hemoglobin levels today. Schedule a follow-up appointment in one month to assess your response to the treatment for depression and anxiety.

## 2023-07-16 NOTE — Assessment & Plan Note (Signed)
Uncontrolled.   Reports significant anxiety and depression following recent hospitalization. History of previous treatment with Cymbalta and Zoloft. -Start Sertraline 50mg , beginning with a half tablet at bedtime for a week, then increasing to a full tablet if tolerated. -Short-term use of Xanax twice daily as needed for acute anxiety episodes. -Schedule follow-up in one month to assess response to treatment.

## 2023-07-16 NOTE — Progress Notes (Signed)
Subjective:     Patient ID: Lindsay Frost, female    DOB: 04/04/1956, 67 y.o.   MRN: [id number]  Chief Complaint  Patient presents with   Hospitalization Follow-up    Here for follow up after 37 days hospitalization.    Depression    Patient reports increasing depression symptoms   Anxiety    Patient reports increasing anxiety symptoms    Depression        Past medical history includes anxiety.   Anxiety      Discussed the use of AI scribe software for clinical note transcription with the patient, who gave verbal consent to proceed.  History of Present Illness          Patient presented to [facility] on 9/20 for planned laparotomy, distal gastrectomy with Roux-en-Y reconfiguration with G-tube and J-tube placement for chronic gastric outlet obstruction due to peptic ulcer disease along with reflux and gastroparesis. 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 This procedure noted obstruction near the J-J that was corrected with takedown and replacement of the J tube.G tube was noted to be dislodged on 10/21, a foley was placed at the bedside and then a G tube was replaced by IR same day. She was tolerating PO's at the time of discharge. She is currently maintained on a full liquid diet and is scheduled to follow up with her surgeon on 11/7.    Lab work during her hospitalization was notable for anemia and hypocalcemia. She reports that since returning home, she is tolerating her liquid diet without difficulty and has been moving her bowels regularly.   Her main concern today is worsening depression and anxiety. She is not sleeping well.  Symptoms began in the hospital and have continued since she returned home.  Her sister accompanies her today to her visit.    Health Maintenance Due  Topic Date Due   Zoster Vaccines- Shingrix (1 of 2) Never done   COVID-19 Vaccine (1 - 2023-24 season) Never done     Past Medical History:  Diagnosis Date   Acute midline thoracic back pain 02/16/2021   Acute sinusitis 09/05/2014   Allergic rhinitis 07/16/2016   Allergy 2005   Anemia 2021   Arthritis    B12 deficiency 01/02/2021   Back pain    arthritis   Chronic sinusitis 12/05/2020   Depression    takes cymbalta daily   Diarrhea 02/16/2021   Duodenal stenosis    Epigastric pain 07/29/2018   Essential hypertension 05/14/2019   Gallstones    Generalized abdominal pain 02/16/2021   GERD (gastroesophageal reflux disease)    takes Omeprazole daily   Headache    Heart murmur    Hiatal hernia    History of gastric ulcer    History of migraine    last one about 2wks ago-takes Imitrex prn   History of seizures    Joint pain    Joint swelling    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   Meningioma (HCC)    Migraine without aura and without status migrainosus, not intractable 01/13/2015   Mixed dyslipidemia 07/25/2020   Myringotomy tube status 02/27/2021   Nausea 02/16/2021   Nocturnal leg cramps 02/20/2015   Obesity (BMI 30-39.9) 07/22/2014   Osteoarthritis of left knee 12/02/2012   Overactive bladder 01/02/2021   Peptic ulcer disease    Physical exam 07/22/2014   Recurrent acute serous otitis media of left ear  12/05/2020   Seizure-like activity (HCC) 05/12/2014    Past Surgical History:  Procedure Laterality Date   BALLOON DILATION  04/03/2012   Procedure: BALLOON DILATION;  Surgeon: Theda Belfast, MD;  Location: [facility];  Service: Endoscopy;  Laterality: N/A;   BIOPSY  08/24/2019   Procedure: BIOPSY;  Surgeon: Shellia Cleverly, DO;  Location: [facility];  Service: Gastroenterology;;  EGD and COLON   BRAVO PH STUDY N/A 08/24/2019   Procedure: BRAVO PH STUDY;  Surgeon: Shellia Cleverly, DO;  Location: [facility];  Service: Gastroenterology;  Laterality: N/A;   CHOLECYSTECTOMY N/A 04/24/2018   Procedure: LAPAROSCOPIC  CHOLECYSTECTOMY;  Surgeon: Berna Bue, MD;  Location: [facility];  Service: General;  Laterality: N/A;   COLONOSCOPY     COLONOSCOPY WITH PROPOFOL N/A 08/24/2019   Procedure: COLONOSCOPY WITH PROPOFOL;  Surgeon: Shellia Cleverly, DO;  Location: [facility];  Service: Gastroenterology;  Laterality: N/A;   DIAGNOSTIC LAPAROSCOPY  83yrs ago   ESOPHAGEAL MANOMETRY N/A 09/04/2015   Procedure: ESOPHAGEAL MANOMETRY (EM);  Surgeon: Jeani Hawking, MD;  Location: [facility];  Service: Endoscopy;  Laterality: N/A;   ESOPHAGEAL MANOMETRY N/A 12/25/2022   Procedure: ESOPHAGEAL MANOMETRY (EM);  Surgeon: Shellia Cleverly, DO;  Location: [facility];  Service: Gastroenterology;  Laterality: N/A;   ESOPHAGOGASTRODUODENOSCOPY  04/03/2012   Procedure: ESOPHAGOGASTRODUODENOSCOPY (EGD);  Surgeon: Theda Belfast, MD;  Location: [facility];  Service: Endoscopy;  Laterality: N/A;  EGD w/ balloon dilation   ESOPHAGOGASTRODUODENOSCOPY (EGD) WITH PROPOFOL N/A 02/18/2014   Procedure: ESOPHAGOGASTRODUODENOSCOPY (EGD) WITH PROPOFOL;  Surgeon: Theda Belfast, MD;  [facility]: [facility];  Service: Endoscopy;  Laterality: N/A;   ESOPHAGOGASTRODUODENOSCOPY (EGD) WITH PROPOFOL N/A 04/22/2014   Procedure: ESOPHAGOGASTRODUODENOSCOPY (EGD) WITH PROPOFOL;  Surgeon: Theda Belfast, MD;  Location: [facility];  Service: Endoscopy;  Laterality: N/A;   ESOPHAGOGASTRODUODENOSCOPY (EGD) WITH PROPOFOL N/A 08/24/2019   Procedure: ESOPHAGOGASTRODUODENOSCOPY (EGD) WITH PROPOFOL;  Surgeon: Shellia Cleverly, DO;  Location: [facility];  Service: Gastroenterology;  Laterality: N/A;   EYE SURGERY Bilateral 2021   GASTRECTOMY N/A 06/06/2023   Procedure: DISTAL GASTRECTOMY;  Surgeon: Gaynelle Adu, MD;  Location: [facility] OR;  Service: General;  Laterality: N/A;   GASTROSTOMY N/A 06/06/2023   Procedure: PLACEMENT OF GASTRIC AND JEJUNAL FEEDING TUBES;  Surgeon: Gaynelle Adu, MD;  Location: [facility] OR;  Service: General;  Laterality: N/A;   IR  REPLC GASTRO/COLONIC TUBE PERCUT W/FLUORO  07/07/2023   LAPAROTOMY N/A 06/06/2023   Procedure: EXPLORATION LAPAROTOMY;  Surgeon: Gaynelle Adu, MD;  Location[facility];  Service: General;  Laterality: N/A;   LAPAROTOMY N/A 06/18/2023   Procedure: EXPLORATION LAPAROTOMY, INSERTION OF JEJUNAL FEEDING TUBE;  Surgeon: Andria Meuse, MD;  Location: [facility] OR;  Service: General;  Laterality: N/A;   LEFT HEART CATH AND CORONARY ANGIOGRAPHY N/A 04/18/2021   Procedure: LEFT HEART CATH AND CORONARY ANGIOGRAPHY;  Surgeon: Runell Gess, MD;  Location: [facility];  Service: Cardiovascular;  Laterality: N/A;   NASAL SEPTUM SURGERY  09/2016   PH IMPEDANCE STUDY N/A 09/04/2015   Procedure: PH IMPEDANCE STUDY;  Surgeon: Jeani Hawking, MD;  Location: [facility];  Service: Endoscopy;  Laterality: N/A;   REPLACEMENT TOTAL KNEE Right 22yrs ago   TOTAL KNEE ARTHROPLASTY Left 11/30/2012   Procedure: LEFT TOTAL KNEE ARTHROPLASTY;  Surgeon: Nestor Lewandowsky, MD;  Location: [facility] OR;  Service: Orthopedics;  Laterality: Left;    Family History  Problem Relation Age of Onset   Arthritis Mother  Cancer Mother        breast   Heart disease Mother        died from "heart problems" at age 16, had CAD   Depression Mother    Heart disease Father        had CABG   Kidney disease Father    Arthritis Sister    Heart murmur Sister    Arthritis Sister    Aneurysm Sister    Arthritis Brother        "serious back/neck problems"   Healthy Daughter    Colon cancer Neg Hx    Esophageal cancer Neg Hx    Pancreatic cancer Neg Hx    Stomach cancer Neg Hx    Rectal cancer Neg Hx     Social History   Socioeconomic History   Marital status: Widowed    Spouse name: Not on file   Number of children: 1   Years of education: Not on file   Highest education level: 12th grade  Occupational History   Occupation: Retired  Tobacco Use   Smoking status: Never   Smokeless tobacco: Never  Vaping Use   Vaping status:  Never Used  Substance and Sexual Activity   Alcohol use: No   Drug use: No   Sexual activity: Not Currently    Birth control/protection: Post-menopausal  Other Topics Concern   Not on file  Social History Narrative   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   Single   1 daughter- lives in [place]   2 dogs   Enjoys reading, watching television   Pt is right handed   Drinks coffee once a week, green tea daily, soda sometimes    Social Determinants of Health   Financial Resource Strain: Low Risk  (07/16/2023)   Overall Financial Resource Strain (CARDIA)    Difficulty of Paying Living Expenses: Not very hard  Food Insecurity: No Food Insecurity (07/16/2023)   Hunger Vital Sign    Worried About Running Out of Food in the Last Year: Never true    Ran Out of Food in the Last Year: Never true  Transportation Needs: No Transportation Needs (07/16/2023)   PRAPARE - Administrator, Civil Service (Medical): No    Lack of Transportation (Non-Medical): No  Physical Activity: Insufficiently Active (07/16/2023)   Exercise Vital Sign    Days of Exercise per Week: 2 days    Minutes of Exercise per Session: 10 min  Stress: Stress Concern Present (07/16/2023)   [employer] of Occupational Health - Occupational Stress Questionnaire    Feeling of Stress : Rather much  Social Connections: Moderately Integrated (07/16/2023)   Social Connection and Isolation Panel [NHANES]    Frequency of Communication with Friends and Family: More than three times a week    Frequency of Social Gatherings with Friends and Family: Once a week    Attends Religious Services: More than 4 times per year    Active Member of [employer] or Organizations: Yes    Attends Banker Meetings: More than 4 times per year    Marital Status: Widowed  Intimate Partner Violence: Not At Risk (06/19/2023)   Humiliation, Afraid, Rape, and Kick questionnaire    Fear of Current or  Ex-Partner: No    Emotionally Abused: No    Physically Abused: No    Sexually Abused: No    Outpatient Medications Prior to Visit  Medication Sig Dispense Refill   acetaminophen (  TYLENOL) 500 MG tablet Take 2 tablets (1,000 mg total) by mouth 4 (four) times daily. 30 tablet 0   albuterol (VENTOLIN HFA) 108 (90 Base) MCG/ACT inhaler Inhale 2 puffs into the lungs every 6 (six) hours as needed for wheezing or shortness of breath. 8 g 1   alendronate (FOSAMAX) 70 MG tablet Take 1 tablet (70 mg total) by mouth every 7 (seven) days. Take with a full glass of water on an empty stomach. 4 tablet 11   aspirin EC 81 MG tablet Take 1 tablet (81 mg total) by mouth daily. Swallow whole. 90 tablet 3   atorvastatin (LIPITOR) 20 MG tablet TAKE 1 TABLET(20 MG) BY MOUTH DAILY 90 tablet 1   eletriptan (RELPAX) 40 MG tablet Take 1 tablet (40 mg total) by mouth as needed for migraine or headache. May repeat in 2 hours if headache persists or recurs. 10 tablet 5   fluticasone (FLONASE) 50 MCG/ACT nasal spray SHAKE LIQUID AND USE 2 SPRAYS IN EACH NOSTRIL DAILY 48 g 3   levETIRAcetam (KEPPRA) 1000 MG tablet Take 1 tablet (1,000 mg total) by mouth 2 (two) times daily. Take 1 tablet twice daily for one week, then increase to 2 tablets twice daily. 60 tablet 0   lidocaine (LIDODERM) 5 % Place 1 patch onto the skin daily. Remove & Discard patch within 12 hours or as directed by MD 10 patch 0   liver oil-zinc oxide (DESITIN) 40 % ointment Apply topically 2 (two) times daily.     methocarbamol (ROBAXIN) 500 MG tablet Take 2 tablets (1,000 mg total) by mouth every 6 (six) hours as needed for muscle spasms. 80 tablet 0   metoCLOPramide (REGLAN) 10 MG tablet Take 1 tablet (10 mg total) by mouth 4 (four) times daily -  before meals and at bedtime for 14 days. 56 tablet 0   mirabegron ER (MYRBETRIQ) 25 MG TB24 tablet TAKE 1 TABLET(25 MG) BY MOUTH DAILY 90 tablet 1   Multiple Vitamins-Iron (MULTIVITAMINS WITH IRON) TABS tablet  Take 1 tablet by mouth daily.     nitroGLYCERIN (NITROSTAT) 0.4 MG SL tablet Place 0.4 mg under the tongue every 5 (five) minutes as needed for chest pain.     ondansetron (ZOFRAN-ODT) 4 MG disintegrating tablet Take 1 tablet (4 mg total) by mouth every 6 (six) hours as needed for nausea. 20 tablet 0   pantoprazole (PROTONIX) 40 MG tablet Take 1 tablet (40 mg total) by mouth at bedtime. 30 tablet 0   RESTASIS 0.05 % ophthalmic emulsion Place 1 drop into both eyes 2 (two) times daily.     vitamin D3 (CHOLECALCIFEROL) 25 MCG tablet Take 1 tablet (1,000 Units total) by mouth daily. 100 tablet 0   oxyCODONE (OXY IR/ROXICODONE) 5 MG immediate release tablet Take 1-2 tablets (5-10 mg total) by mouth every 4 (four) hours as needed for moderate pain (pain score 4-6) or severe pain (pain score 7-10) (5 moderate, 10 severe). 25 tablet 0   No facility-administered medications prior to visit.    Allergies  Allergen Reactions   Naratriptan     Ineffective    Review of Systems  Psychiatric/Behavioral:  Positive for depression.    See HPI    Objective:    Physical Exam Constitutional:      General: She is not in acute distress.    Appearance: Normal appearance. She is well-developed and underweight.  HENT:     Head: Normocephalic and atraumatic.     Right Ear: External ear normal.  Left Ear: External ear normal.  Eyes:     General: No scleral icterus. Neck:     Thyroid: No thyromegaly.  Cardiovascular:     Rate and Rhythm: Normal rate and regular rhythm.     Heart sounds: Normal heart sounds. No murmur heard. Pulmonary:     Effort: Pulmonary effort is normal. No respiratory distress.     Breath sounds: Normal breath sounds. No wheezing.  Abdominal:     General: Bowel sounds are normal.     Palpations: Abdomen is soft.     Tenderness: There is no abdominal tenderness.     Comments: Dressings over G-tube and J- tube, both clean, dry, intact  Musculoskeletal:     Cervical back: Neck  supple.  Skin:    General: Skin is warm and dry.  Neurological:     Mental Status: She is alert and oriented to person, place, and time.  Psychiatric:        Mood and Affect: Mood normal.        Behavior: Behavior normal.        Thought Content: Thought content normal.        Judgment: Judgment normal.      BP (!) 106/53 (BP Location: Right Arm, Patient Position: Sitting, Cuff Size: Small)   Pulse (!) 101   Temp 97.6 F (36.4 C) (Oral)   Resp 16   Ht 5\' 4"  (1.626 m)   Wt 104 lb (47.2 kg)   SpO2 100%   BMI 17.85 kg/m  Wt Readings from Last 3 Encounters:  07/16/23 104 lb (47.2 kg)  07/11/23 110 lb 0.2 oz (49.9 kg)  05/30/23 106 lb 3.2 oz (48.2 kg)       Assessment & Plan:   Problem List Items Addressed This Visit       Unprioritized   S/P partial gastrectomy    Post-operative recovery from distal gastrectomy Recent hospital discharge following prolonged stay due to complications including obstruction and feeding tube issues. Currently on a liquid diet and moving bowels.   -Follow up with surgeon, Dr. Andrey Campanile, next Friday. -Check calcium and hemoglobin levels today to monitor for ongoing anemia and potential hypocalcemia.       Depression with anxiety    Uncontrolled.   Reports significant anxiety and depression following recent hospitalization. History of previous treatment with Cymbalta and Zoloft. -Start Sertraline 50mg , beginning with a half tablet at bedtime for a week, then increasing to a full tablet if tolerated. -Short-term use of Xanax twice daily as needed for acute anxiety episodes. -Schedule follow-up in one month to assess response to treatment.      Relevant Medications   sertraline (ZOLOFT) 50 MG tablet   ALPRAZolam (XANAX) 0.5 MG tablet   Other Visit Diagnoses     Hypocalcemia    -  Primary   Relevant Orders   Comp Met (CMET)   Anemia, unspecified type       Relevant Orders   CBC w/Diff      43 minutes spent on today's visit. Time was  spent interviewing/examining patient and reviewing hospitalization records.   I have discontinued Payton Doughty. Lewter's oxyCODONE. I am also having her start on sertraline and ALPRAZolam. Additionally, I am having her maintain her alendronate, aspirin EC, nitroGLYCERIN, fluticasone, Restasis, atorvastatin, mirabegron ER, albuterol, eletriptan, acetaminophen, metoCLOPramide, ondansetron, pantoprazole, methocarbamol, multivitamins with iron, lidocaine, liver oil-zinc oxide, vitamin D3, and levETIRAcetam.  Meds ordered this encounter  Medications   sertraline (ZOLOFT) 50 MG tablet  Sig: 1/2 tablet by mouth at bedtime for 1 week, then increase to a full tab once daily on week two.    Dispense:  30 tablet    Refill:  3    Order Specific Question:   Supervising Provider    Answer:   Danise Edge A [[id number]]   ALPRAZolam (XANAX) 0.5 MG tablet    Sig: Take 1 tablet (0.5 mg total) by mouth 2 (two) times daily as needed for anxiety.    Dispense:  30 tablet    Refill:  0    Order Specific Question:   Supervising Provider    Answer:   Danise Edge A [[id number]]

## 2023-07-17 DIAGNOSIS — M1990 Unspecified osteoarthritis, unspecified site: Secondary | ICD-10-CM | POA: Diagnosis not present

## 2023-07-17 DIAGNOSIS — K2100 Gastro-esophageal reflux disease with esophagitis, without bleeding: Secondary | ICD-10-CM | POA: Diagnosis not present

## 2023-07-17 DIAGNOSIS — D649 Anemia, unspecified: Secondary | ICD-10-CM | POA: Diagnosis not present

## 2023-07-17 DIAGNOSIS — I2510 Atherosclerotic heart disease of native coronary artery without angina pectoris: Secondary | ICD-10-CM | POA: Diagnosis not present

## 2023-07-17 DIAGNOSIS — Z434 Encounter for attention to other artificial openings of digestive tract: Secondary | ICD-10-CM | POA: Diagnosis not present

## 2023-07-17 DIAGNOSIS — J309 Allergic rhinitis, unspecified: Secondary | ICD-10-CM | POA: Diagnosis not present

## 2023-07-17 DIAGNOSIS — Z431 Encounter for attention to gastrostomy: Secondary | ICD-10-CM | POA: Diagnosis not present

## 2023-07-17 DIAGNOSIS — G8929 Other chronic pain: Secondary | ICD-10-CM | POA: Diagnosis not present

## 2023-07-17 DIAGNOSIS — E782 Mixed hyperlipidemia: Secondary | ICD-10-CM | POA: Diagnosis not present

## 2023-07-17 DIAGNOSIS — J984 Other disorders of lung: Secondary | ICD-10-CM | POA: Diagnosis not present

## 2023-07-17 DIAGNOSIS — G43009 Migraine without aura, not intractable, without status migrainosus: Secondary | ICD-10-CM | POA: Diagnosis not present

## 2023-07-17 DIAGNOSIS — N3281 Overactive bladder: Secondary | ICD-10-CM | POA: Diagnosis not present

## 2023-07-17 DIAGNOSIS — Z48815 Encounter for surgical aftercare following surgery on the digestive system: Secondary | ICD-10-CM | POA: Diagnosis not present

## 2023-07-17 DIAGNOSIS — M549 Dorsalgia, unspecified: Secondary | ICD-10-CM | POA: Diagnosis not present

## 2023-07-17 DIAGNOSIS — K449 Diaphragmatic hernia without obstruction or gangrene: Secondary | ICD-10-CM | POA: Diagnosis not present

## 2023-07-17 DIAGNOSIS — I10 Essential (primary) hypertension: Secondary | ICD-10-CM | POA: Diagnosis not present

## 2023-07-17 DIAGNOSIS — E538 Deficiency of other specified B group vitamins: Secondary | ICD-10-CM | POA: Diagnosis not present

## 2023-07-17 DIAGNOSIS — H90A32 Mixed conductive and sensorineural hearing loss, unilateral, left ear with restricted hearing on the contralateral side: Secondary | ICD-10-CM | POA: Diagnosis not present

## 2023-07-17 DIAGNOSIS — R3911 Hesitancy of micturition: Secondary | ICD-10-CM | POA: Diagnosis not present

## 2023-07-17 DIAGNOSIS — E43 Unspecified severe protein-calorie malnutrition: Secondary | ICD-10-CM | POA: Diagnosis not present

## 2023-07-23 ENCOUNTER — Other Ambulatory Visit (HOSPITAL_COMMUNITY): Payer: Self-pay

## 2023-07-24 DIAGNOSIS — N3281 Overactive bladder: Secondary | ICD-10-CM | POA: Diagnosis not present

## 2023-07-24 DIAGNOSIS — K2100 Gastro-esophageal reflux disease with esophagitis, without bleeding: Secondary | ICD-10-CM | POA: Diagnosis not present

## 2023-07-24 DIAGNOSIS — E43 Unspecified severe protein-calorie malnutrition: Secondary | ICD-10-CM | POA: Diagnosis not present

## 2023-07-24 DIAGNOSIS — R3911 Hesitancy of micturition: Secondary | ICD-10-CM | POA: Diagnosis not present

## 2023-07-24 DIAGNOSIS — Z431 Encounter for attention to gastrostomy: Secondary | ICD-10-CM | POA: Diagnosis not present

## 2023-07-24 DIAGNOSIS — E782 Mixed hyperlipidemia: Secondary | ICD-10-CM | POA: Diagnosis not present

## 2023-07-24 DIAGNOSIS — I2510 Atherosclerotic heart disease of native coronary artery without angina pectoris: Secondary | ICD-10-CM | POA: Diagnosis not present

## 2023-07-24 DIAGNOSIS — Z9889 Other specified postprocedural states: Secondary | ICD-10-CM | POA: Diagnosis not present

## 2023-07-24 DIAGNOSIS — H90A32 Mixed conductive and sensorineural hearing loss, unilateral, left ear with restricted hearing on the contralateral side: Secondary | ICD-10-CM | POA: Diagnosis not present

## 2023-07-24 DIAGNOSIS — E538 Deficiency of other specified B group vitamins: Secondary | ICD-10-CM | POA: Diagnosis not present

## 2023-07-24 DIAGNOSIS — D649 Anemia, unspecified: Secondary | ICD-10-CM | POA: Diagnosis not present

## 2023-07-24 DIAGNOSIS — M549 Dorsalgia, unspecified: Secondary | ICD-10-CM | POA: Diagnosis not present

## 2023-07-24 DIAGNOSIS — G43009 Migraine without aura, not intractable, without status migrainosus: Secondary | ICD-10-CM | POA: Diagnosis not present

## 2023-07-24 DIAGNOSIS — M1990 Unspecified osteoarthritis, unspecified site: Secondary | ICD-10-CM | POA: Diagnosis not present

## 2023-07-24 DIAGNOSIS — Z48815 Encounter for surgical aftercare following surgery on the digestive system: Secondary | ICD-10-CM | POA: Diagnosis not present

## 2023-07-24 DIAGNOSIS — J309 Allergic rhinitis, unspecified: Secondary | ICD-10-CM | POA: Diagnosis not present

## 2023-07-24 DIAGNOSIS — I10 Essential (primary) hypertension: Secondary | ICD-10-CM | POA: Diagnosis not present

## 2023-07-24 DIAGNOSIS — E569 Vitamin deficiency, unspecified: Secondary | ICD-10-CM | POA: Diagnosis not present

## 2023-07-24 DIAGNOSIS — G8929 Other chronic pain: Secondary | ICD-10-CM | POA: Diagnosis not present

## 2023-07-24 DIAGNOSIS — J984 Other disorders of lung: Secondary | ICD-10-CM | POA: Diagnosis not present

## 2023-07-24 DIAGNOSIS — K449 Diaphragmatic hernia without obstruction or gangrene: Secondary | ICD-10-CM | POA: Diagnosis not present

## 2023-07-24 DIAGNOSIS — Z434 Encounter for attention to other artificial openings of digestive tract: Secondary | ICD-10-CM | POA: Diagnosis not present

## 2023-07-28 MED ORDER — BUSPIRONE HCL 7.5 MG PO TABS
7.5000 mg | ORAL_TABLET | Freq: Two times a day (BID) | ORAL | 2 refills | Status: DC
Start: 2023-07-28 — End: 2023-08-20

## 2023-07-30 ENCOUNTER — Telehealth: Payer: Self-pay | Admitting: Family

## 2023-07-30 DIAGNOSIS — G43009 Migraine without aura, not intractable, without status migrainosus: Secondary | ICD-10-CM | POA: Diagnosis not present

## 2023-07-30 DIAGNOSIS — K2100 Gastro-esophageal reflux disease with esophagitis, without bleeding: Secondary | ICD-10-CM | POA: Diagnosis not present

## 2023-07-30 DIAGNOSIS — E538 Deficiency of other specified B group vitamins: Secondary | ICD-10-CM | POA: Diagnosis not present

## 2023-07-30 DIAGNOSIS — K449 Diaphragmatic hernia without obstruction or gangrene: Secondary | ICD-10-CM | POA: Diagnosis not present

## 2023-07-30 DIAGNOSIS — J309 Allergic rhinitis, unspecified: Secondary | ICD-10-CM | POA: Diagnosis not present

## 2023-07-30 DIAGNOSIS — J984 Other disorders of lung: Secondary | ICD-10-CM | POA: Diagnosis not present

## 2023-07-30 DIAGNOSIS — E782 Mixed hyperlipidemia: Secondary | ICD-10-CM | POA: Diagnosis not present

## 2023-07-30 DIAGNOSIS — G8929 Other chronic pain: Secondary | ICD-10-CM | POA: Diagnosis not present

## 2023-07-30 DIAGNOSIS — Z48815 Encounter for surgical aftercare following surgery on the digestive system: Secondary | ICD-10-CM | POA: Diagnosis not present

## 2023-07-30 DIAGNOSIS — Z431 Encounter for attention to gastrostomy: Secondary | ICD-10-CM | POA: Diagnosis not present

## 2023-07-30 DIAGNOSIS — M549 Dorsalgia, unspecified: Secondary | ICD-10-CM | POA: Diagnosis not present

## 2023-07-30 DIAGNOSIS — I2510 Atherosclerotic heart disease of native coronary artery without angina pectoris: Secondary | ICD-10-CM | POA: Diagnosis not present

## 2023-07-30 DIAGNOSIS — I10 Essential (primary) hypertension: Secondary | ICD-10-CM | POA: Diagnosis not present

## 2023-07-30 DIAGNOSIS — H90A32 Mixed conductive and sensorineural hearing loss, unilateral, left ear with restricted hearing on the contralateral side: Secondary | ICD-10-CM | POA: Diagnosis not present

## 2023-07-30 DIAGNOSIS — D649 Anemia, unspecified: Secondary | ICD-10-CM | POA: Diagnosis not present

## 2023-07-30 DIAGNOSIS — N3281 Overactive bladder: Secondary | ICD-10-CM | POA: Diagnosis not present

## 2023-07-30 DIAGNOSIS — M1990 Unspecified osteoarthritis, unspecified site: Secondary | ICD-10-CM | POA: Diagnosis not present

## 2023-07-30 DIAGNOSIS — E43 Unspecified severe protein-calorie malnutrition: Secondary | ICD-10-CM | POA: Diagnosis not present

## 2023-07-30 DIAGNOSIS — Z434 Encounter for attention to other artificial openings of digestive tract: Secondary | ICD-10-CM | POA: Diagnosis not present

## 2023-07-30 DIAGNOSIS — R3911 Hesitancy of micturition: Secondary | ICD-10-CM | POA: Diagnosis not present

## 2023-07-30 NOTE — Telephone Encounter (Signed)
Mirica, RN, with [facility] called to request an appetite stimulant for the patient. She explained that during her visit with the patient today, she discussed the patient's poor appetite and encouraged her to start snacking more. The patient expressed she is interested in receiving an appetite stimulant. Please call and advise at [phone].

## 2023-07-31 NOTE — Telephone Encounter (Signed)
I will discuss further with her when I see her back for follow up in a few weeks. In the meantime, I would recommend that she add 2 ensures a day.

## 2023-07-31 NOTE — Telephone Encounter (Signed)
Patient notified of provider's advise.

## 2023-08-03 ENCOUNTER — Other Ambulatory Visit (HOSPITAL_COMMUNITY): Payer: Self-pay | Admitting: General Surgery

## 2023-08-07 ENCOUNTER — Encounter: Payer: Self-pay | Admitting: Gastroenterology

## 2023-08-07 DIAGNOSIS — G43009 Migraine without aura, not intractable, without status migrainosus: Secondary | ICD-10-CM | POA: Diagnosis not present

## 2023-08-07 DIAGNOSIS — N3281 Overactive bladder: Secondary | ICD-10-CM | POA: Diagnosis not present

## 2023-08-07 DIAGNOSIS — K2100 Gastro-esophageal reflux disease with esophagitis, without bleeding: Secondary | ICD-10-CM | POA: Diagnosis not present

## 2023-08-07 DIAGNOSIS — Z431 Encounter for attention to gastrostomy: Secondary | ICD-10-CM | POA: Diagnosis not present

## 2023-08-07 DIAGNOSIS — R3911 Hesitancy of micturition: Secondary | ICD-10-CM | POA: Diagnosis not present

## 2023-08-07 DIAGNOSIS — E43 Unspecified severe protein-calorie malnutrition: Secondary | ICD-10-CM | POA: Diagnosis not present

## 2023-08-07 DIAGNOSIS — M1990 Unspecified osteoarthritis, unspecified site: Secondary | ICD-10-CM | POA: Diagnosis not present

## 2023-08-07 DIAGNOSIS — J984 Other disorders of lung: Secondary | ICD-10-CM | POA: Diagnosis not present

## 2023-08-07 DIAGNOSIS — I2510 Atherosclerotic heart disease of native coronary artery without angina pectoris: Secondary | ICD-10-CM | POA: Diagnosis not present

## 2023-08-07 DIAGNOSIS — Z434 Encounter for attention to other artificial openings of digestive tract: Secondary | ICD-10-CM | POA: Diagnosis not present

## 2023-08-07 DIAGNOSIS — M549 Dorsalgia, unspecified: Secondary | ICD-10-CM | POA: Diagnosis not present

## 2023-08-07 DIAGNOSIS — D649 Anemia, unspecified: Secondary | ICD-10-CM | POA: Diagnosis not present

## 2023-08-07 DIAGNOSIS — H90A32 Mixed conductive and sensorineural hearing loss, unilateral, left ear with restricted hearing on the contralateral side: Secondary | ICD-10-CM | POA: Diagnosis not present

## 2023-08-07 DIAGNOSIS — Z48815 Encounter for surgical aftercare following surgery on the digestive system: Secondary | ICD-10-CM | POA: Diagnosis not present

## 2023-08-07 DIAGNOSIS — E538 Deficiency of other specified B group vitamins: Secondary | ICD-10-CM | POA: Diagnosis not present

## 2023-08-07 DIAGNOSIS — E782 Mixed hyperlipidemia: Secondary | ICD-10-CM | POA: Diagnosis not present

## 2023-08-07 DIAGNOSIS — G8929 Other chronic pain: Secondary | ICD-10-CM | POA: Diagnosis not present

## 2023-08-07 DIAGNOSIS — J309 Allergic rhinitis, unspecified: Secondary | ICD-10-CM | POA: Diagnosis not present

## 2023-08-07 DIAGNOSIS — K449 Diaphragmatic hernia without obstruction or gangrene: Secondary | ICD-10-CM | POA: Diagnosis not present

## 2023-08-07 DIAGNOSIS — I10 Essential (primary) hypertension: Secondary | ICD-10-CM | POA: Diagnosis not present

## 2023-08-10 ENCOUNTER — Encounter (INDEPENDENT_AMBULATORY_CARE_PROVIDER_SITE_OTHER): Payer: 59 | Admitting: Family

## 2023-08-10 DIAGNOSIS — R634 Abnormal weight loss: Secondary | ICD-10-CM

## 2023-08-12 DIAGNOSIS — K449 Diaphragmatic hernia without obstruction or gangrene: Secondary | ICD-10-CM | POA: Diagnosis not present

## 2023-08-12 DIAGNOSIS — G8929 Other chronic pain: Secondary | ICD-10-CM | POA: Diagnosis not present

## 2023-08-12 DIAGNOSIS — K2100 Gastro-esophageal reflux disease with esophagitis, without bleeding: Secondary | ICD-10-CM | POA: Diagnosis not present

## 2023-08-12 DIAGNOSIS — E538 Deficiency of other specified B group vitamins: Secondary | ICD-10-CM | POA: Diagnosis not present

## 2023-08-12 DIAGNOSIS — M549 Dorsalgia, unspecified: Secondary | ICD-10-CM | POA: Diagnosis not present

## 2023-08-12 DIAGNOSIS — E782 Mixed hyperlipidemia: Secondary | ICD-10-CM | POA: Diagnosis not present

## 2023-08-12 DIAGNOSIS — I10 Essential (primary) hypertension: Secondary | ICD-10-CM | POA: Diagnosis not present

## 2023-08-12 DIAGNOSIS — R634 Abnormal weight loss: Secondary | ICD-10-CM | POA: Diagnosis not present

## 2023-08-12 DIAGNOSIS — R3911 Hesitancy of micturition: Secondary | ICD-10-CM | POA: Diagnosis not present

## 2023-08-12 DIAGNOSIS — G43009 Migraine without aura, not intractable, without status migrainosus: Secondary | ICD-10-CM | POA: Diagnosis not present

## 2023-08-12 DIAGNOSIS — E43 Unspecified severe protein-calorie malnutrition: Secondary | ICD-10-CM | POA: Diagnosis not present

## 2023-08-12 DIAGNOSIS — I2510 Atherosclerotic heart disease of native coronary artery without angina pectoris: Secondary | ICD-10-CM | POA: Diagnosis not present

## 2023-08-12 DIAGNOSIS — J309 Allergic rhinitis, unspecified: Secondary | ICD-10-CM | POA: Diagnosis not present

## 2023-08-12 DIAGNOSIS — J984 Other disorders of lung: Secondary | ICD-10-CM | POA: Diagnosis not present

## 2023-08-12 DIAGNOSIS — H90A32 Mixed conductive and sensorineural hearing loss, unilateral, left ear with restricted hearing on the contralateral side: Secondary | ICD-10-CM | POA: Diagnosis not present

## 2023-08-12 DIAGNOSIS — M1990 Unspecified osteoarthritis, unspecified site: Secondary | ICD-10-CM | POA: Diagnosis not present

## 2023-08-12 DIAGNOSIS — D649 Anemia, unspecified: Secondary | ICD-10-CM | POA: Diagnosis not present

## 2023-08-12 DIAGNOSIS — Z434 Encounter for attention to other artificial openings of digestive tract: Secondary | ICD-10-CM | POA: Diagnosis not present

## 2023-08-12 DIAGNOSIS — Z431 Encounter for attention to gastrostomy: Secondary | ICD-10-CM | POA: Diagnosis not present

## 2023-08-12 DIAGNOSIS — N3281 Overactive bladder: Secondary | ICD-10-CM | POA: Diagnosis not present

## 2023-08-12 DIAGNOSIS — Z48815 Encounter for surgical aftercare following surgery on the digestive system: Secondary | ICD-10-CM | POA: Diagnosis not present

## 2023-08-12 MED ORDER — MIRTAZAPINE 15 MG PO TABS
15.0000 mg | ORAL_TABLET | Freq: Every day | ORAL | 1 refills | Status: DC
Start: 2023-08-12 — End: 2023-10-08

## 2023-08-12 NOTE — Telephone Encounter (Signed)
Please see the MyChart message reply(ies) for my assessment and plan.  The patient gave consent for this Medical Advice Message and is aware that it may result in a bill to their insurance company as well as the possibility that this may result in a co-payment or deductible. They are an established patient, but are not seeking medical advice exclusively about a problem treated during an in person or video visit in the last 7 days. I did not recommend an in person or video visit within 7 days of my reply.  I spent a total of 5 minutes cumulative provider time within 7 days through [employer].  Lemont Fillers, NP

## 2023-08-13 NOTE — Progress Notes (Signed)
Virtual Visit via Video Note  Consent was obtained for video visit:  Yes.   Answered questions that patient had about telehealth interaction:  Yes.   I discussed the limitations, risks, security and privacy concerns of performing an evaluation and management service by telemedicine. I also discussed with the patient that there may be a patient responsible charge related to this service. The patient expressed understanding and agreed to proceed.  Pt location: Home Physician Location: office Name of referring provider:  Sandford Craze, NP I connected with Lindsay Frost at patients initiation/request on 08/18/2023 at 12:50 PM EST by video enabled telemedicine application and verified that I am speaking with the correct person using two identifiers. Pt MRN:  [id number] Pt DOB:  Apr 03, 1956 Video Participants:  Lindsay Frost  Assessment/Plan:   1.  Migraine without aura, without status migrainosus, not intractable 2.  Focal onset seizures with impaired consciousness 3.  Weight loss/anorexia - at this time, I don't suspect levetiracetam is contributing.  She has had ongoing issues with weight loss and anorexia for years and still recovering from the partial gastrectomy.   1. Seizure prophylaxis:  Keppra 1000mg  twice daily.   2.  Migraine prevention:  will hold off on starting another migraine preventative.  She may not need it and Keppra may be effective.  If they increase, would consider restarting nortriptyline. 3.  Migraine rescue:  Relpax 40mg  4.  Limit use of pain relievers to no more than 2 days out of week to prevent risk of rebound or medication-overuse headache. 5.  Keep headache diary 6.  Follow up 5 months.   Subjective:  Lindsay Frost is a 67 year old female with SIBO, HTN and dyslipidemia who follows up for migraines and seizure disorder   UPDATE: Due to ongoing weight loss, she was tapered off of topiramate and restarted on Keppra.  Due to her chronic gastric outlet  syndrome, she had a partial gastrectomy in September.  She has had some complications since then.  Anorexia has gotten worse.  Wonders if it is due to levetiracetam.   Migraine: She has a migraine about a couple of times a month (rarely severe).  She will try Advil first, and will then take Relpax as second line, so may last 3 hours.     Current NSAIDS:  Ibuprofen Current analgesics:  Tylenol Current triptans:  Relpax Current ergotamine:  none Current anti-emetic:  none Current muscle relaxants:  Flexeril (for back pain) Current anti-anxiolytic:  none Current sleep aide:  none Current Antihypertensive medications:  none Current Antidepressant medications:  Lexapro 20mg  Current Anticonvulsant medications:  Keppra 1000mg  twice daily Current anti-CGRP:  none Current Vitamins/Herbal/Supplements:  none Current Antihistamines/Decongestants:  Flonase Other therapy:  none   Caffeine:  no caffeine Alcohol:  no Smoker:  no Diet:  Some junk food.  Drinks flavored water.   Exercise:  Walks dog Depression:  no; Anxiety:  yes Other pain:  Back pain.  Has an atypical spinal hemangioma as well. Sleep hygiene:  good   Seizures: No seizures.  Last spell occurred 05/27/15.   Current preventative: Keppra 1000mg  twice daily    HISTORY: Migraine: Onset:  Early 40s Location:  Left frontal Quality:  Stabbing Initial Intensity:  10 out of 10; September 7/10 Aura:  No Prodrome:  Photophobia Associated symptoms:  Nausea, phonophobia, photophobia, sometimes vomiting. No osmophobia, visual disturbance, or autonomic symptoms.  No associated unilateral numbness or weakness. Initial Duration:  2-3 days; September 3 hours with Relpax Initial  Frequency:  Migraines occur every 2 weeks (4-6 days per month) and dull headaches occur 2 times a week (8 days per month). Total of 14 headache days per month; September 3 times a month Triggers:  Heat Relieving factors:  Laying down in a quiet, dark, cool room with  ice pack. Activity:  Cannot function when she has migraine   Past abortive therapy:  sumatriptan 50mg  (ineffective, caused racing heart), Maxalt (ineffective), BC powder Past preventative therapy:  Cymbalta (for depression), nortriptyline 50mg  (discontinued in order to simplify medication list) Unable to start Inderal LA because insurance wouldn't cover it.   Family history of headache:  daughter   Seizure: Her Wellbutrin was increased in August 2016 to help control her depression.    At around that time, she had an episode where she woke up and found herself on the bathroom floor.  She didn't remember falling.  Later in the month, she had another episode where she woke up on the floor next to her bed and noted that she was briefly kicking both legs.  She had a severe rug burn on the left side of her face.  She did not bite her tongue during either event but she did have some urinary incontinence during the episode in the bathroom.  Her PCP subsequently decreased her dose of Wellbutrin.  She has no prior history of seizures.  She had a sleep deprived EEG performed on 06/07/14, which showed brief intermittent transient sharp waves and slowing in the left temporal region, which although not epileptiform, could be a seizure focus.   On 05/27/15, she had a different spell.  It was unwitnessed.  When she woke up, she was short of breath and it took a few minutes to recover.  Sometimes, she will wake up short of breath, but this episode was worse.  She did not have incontinence or tongue biting.     MRI of brain with and without contrast from 08/19/13 demonstrated a 12 mm left parietal calcified meningioma without surrounding vasogenic edema, minimally increased compared to prior study from 10/2003. MRI of the brain with and without contrast was performed on 06/08/14, which demonstrated stability of the meningioma.  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   Past antiepileptic medication:  topiramate 100mg  twice daily (excessive weight loss)  Past Medical History: Past Medical History:  Diagnosis Date   Acute midline thoracic back pain 02/16/2021   Acute sinusitis 09/05/2014   Allergic rhinitis 07/16/2016   Allergy 2005   Anemia 2021   Arthritis    B12 deficiency 01/02/2021   Back pain    arthritis   Chronic sinusitis 12/05/2020   Depression    takes cymbalta daily   Diarrhea 02/16/2021   Duodenal stenosis    Epigastric pain 07/29/2018   Essential hypertension 05/14/2019   Gallstones    Generalized abdominal pain 02/16/2021   GERD (gastroesophageal reflux disease)    takes Omeprazole daily   Headache    Heart murmur    Hiatal hernia    History of gastric ulcer    History of migraine    last one about 2wks ago-takes Imitrex prn   History of seizures    Joint pain    Joint swelling    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   Meningioma (HCC)    Migraine without aura and without  status migrainosus, not intractable 01/13/2015   Mixed dyslipidemia 07/25/2020   Myringotomy tube status 02/27/2021   Nausea 02/16/2021   Nocturnal leg cramps 02/20/2015   Obesity (BMI 30-39.9) 07/22/2014   Osteoarthritis of left knee 12/02/2012   Overactive bladder 01/02/2021   Peptic ulcer disease    Physical exam 07/22/2014   Recurrent acute serous otitis media of left ear 12/05/2020   Seizure-like activity (HCC) 05/12/2014    Medications: Outpatient Encounter Medications as of 08/18/2023  Medication Sig Note   acetaminophen (TYLENOL) 500 MG tablet Take 2 tablets (1,000 mg total) by mouth 4 (four) times daily.    albuterol (VENTOLIN HFA) 108 (90 Base) MCG/ACT inhaler Inhale 2 puffs into the lungs every 6 (six) hours as needed for wheezing or shortness of breath.    alendronate (FOSAMAX) 70 MG tablet Take 1 tablet  (70 mg total) by mouth every 7 (seven) days. Take with a full glass of water on an empty stomach.    ALPRAZolam (XANAX) 0.5 MG tablet Take 1 tablet (0.5 mg total) by mouth 2 (two) times daily as needed for anxiety.    aspirin EC 81 MG tablet Take 1 tablet (81 mg total) by mouth daily. Swallow whole.    atorvastatin (LIPITOR) 20 MG tablet TAKE 1 TABLET(20 MG) BY MOUTH DAILY    busPIRone (BUSPAR) 7.5 MG tablet Take 1 tablet (7.5 mg total) by mouth 2 (two) times daily.    eletriptan (RELPAX) 40 MG tablet Take 1 tablet (40 mg total) by mouth as needed for migraine or headache. May repeat in 2 hours if headache persists or recurs.    fluticasone (FLONASE) 50 MCG/ACT nasal spray SHAKE LIQUID AND USE 2 SPRAYS IN EACH NOSTRIL DAILY    levETIRAcetam (KEPPRA) 1000 MG tablet Take 1 tablet (1,000 mg total) by mouth 2 (two) times daily. Take 1 tablet twice daily for one week, then increase to 2 tablets twice daily.    lidocaine (LIDODERM) 5 % Place 1 patch onto the skin daily. Remove & Discard patch within 12 hours or as directed by MD    liver oil-zinc oxide (DESITIN) 40 % ointment Apply topically 2 (two) times daily.    methocarbamol (ROBAXIN) 500 MG tablet Take 2 tablets (1,000 mg total) by mouth every 6 (six) hours as needed for muscle spasms.    metoCLOPramide (REGLAN) 10 MG tablet Take 1 tablet (10 mg total) by mouth 4 (four) times daily -  before meals and at bedtime for 14 days.    mirabegron ER (MYRBETRIQ) 25 MG TB24 tablet TAKE 1 TABLET(25 MG) BY MOUTH DAILY    mirtazapine (REMERON) 15 MG tablet Take 1 tablet (15 mg total) by mouth at bedtime.    Multiple Vitamins-Iron (MULTIVITAMINS WITH IRON) TABS tablet Take 1 tablet by mouth daily.    nitroGLYCERIN (NITROSTAT) 0.4 MG SL tablet Place 0.4 mg under the tongue every 5 (five) minutes as needed for chest pain. 06/06/2023: Never used per patient   ondansetron (ZOFRAN-ODT) 4 MG disintegrating tablet Take 1 tablet (4 mg total) by mouth every 6 (six) hours as  needed for nausea.    pantoprazole (PROTONIX) 40 MG tablet Take 1 tablet (40 mg total) by mouth at bedtime.    RESTASIS 0.05 % ophthalmic emulsion Place 1 drop into both eyes 2 (two) times daily.    sertraline (ZOLOFT) 50 MG tablet 1/2 tablet by mouth at bedtime for 1 week, then increase to a full tab once daily on week two.    vitamin  D3 (CHOLECALCIFEROL) 25 MCG tablet Take 1 tablet (1,000 Units total) by mouth daily.    No facility-administered encounter medications on file as of 08/18/2023.    Allergies: Allergies  Allergen Reactions   Naratriptan     Ineffective    Family History: Family History  Problem Relation Age of Onset   Arthritis Mother    Cancer Mother        breast   Heart disease Mother        died from "heart problems" at age 4, had CAD   Depression Mother    Heart disease Father        had CABG   Kidney disease Father    Arthritis Sister    Heart murmur Sister    Arthritis Sister    Aneurysm Sister    Arthritis Brother        "serious back/neck problems"   Healthy Daughter    Colon cancer Neg Hx    Esophageal cancer Neg Hx    Pancreatic cancer Neg Hx    Stomach cancer Neg Hx    Rectal cancer Neg Hx     Observations/Objective:   No acute distress.  Alert and oriented.  Speech fluent and not dysarthric.  Language intact.    Follow Up Instructions:    -I discussed the assessment and treatment plan with the patient. The patient was provided an opportunity to ask questions and all were answered. The patient agreed with the plan and demonstrated an understanding of the instructions.   The patient was advised to call back or seek an in-person evaluation if the symptoms worsen or if the condition fails to improve as anticipated.   Cira Servant, DO

## 2023-08-18 ENCOUNTER — Telehealth: Payer: 59 | Admitting: Neurology

## 2023-08-18 ENCOUNTER — Encounter: Payer: Self-pay | Admitting: Neurology

## 2023-08-18 DIAGNOSIS — G40209 Localization-related (focal) (partial) symptomatic epilepsy and epileptic syndromes with complex partial seizures, not intractable, without status epilepticus: Secondary | ICD-10-CM | POA: Diagnosis not present

## 2023-08-18 DIAGNOSIS — G43009 Migraine without aura, not intractable, without status migrainosus: Secondary | ICD-10-CM

## 2023-08-18 MED ORDER — LEVETIRACETAM 1000 MG PO TABS: 1000.0000 mg | ORAL_TABLET | Freq: Two times a day (BID) | ORAL | Status: DC

## 2023-08-20 ENCOUNTER — Encounter: Payer: Self-pay | Admitting: Gastroenterology

## 2023-08-20 ENCOUNTER — Other Ambulatory Visit (HOSPITAL_BASED_OUTPATIENT_CLINIC_OR_DEPARTMENT_OTHER): Payer: Self-pay

## 2023-08-20 ENCOUNTER — Ambulatory Visit (INDEPENDENT_AMBULATORY_CARE_PROVIDER_SITE_OTHER): Payer: 59 | Admitting: Family

## 2023-08-20 VITALS — BP 130/77 | HR 91 | Temp 98.1°F | Resp 16 | Ht 64.0 in | Wt 101.8 lb

## 2023-08-20 DIAGNOSIS — I10 Essential (primary) hypertension: Secondary | ICD-10-CM

## 2023-08-20 DIAGNOSIS — E538 Deficiency of other specified B group vitamins: Secondary | ICD-10-CM | POA: Diagnosis not present

## 2023-08-20 DIAGNOSIS — K5900 Constipation, unspecified: Secondary | ICD-10-CM | POA: Diagnosis not present

## 2023-08-20 DIAGNOSIS — F418 Other specified anxiety disorders: Secondary | ICD-10-CM

## 2023-08-20 MED ORDER — CYANOCOBALAMIN 1000 MCG/ML IJ SOLN
1000.0000 ug | Freq: Once | INTRAMUSCULAR | Status: AC
  Administered 2023-08-20: 1000 ug via INTRAMUSCULAR

## 2023-08-20 MED ORDER — ALPRAZOLAM 0.5 MG PO TABS
0.5000 mg | ORAL_TABLET | Freq: Two times a day (BID) | ORAL | 0 refills | Status: DC | PRN
  Filled 2023-08-20: qty 45, 23d supply, fill #0

## 2023-08-20 MED ORDER — BUSPIRONE HCL 15 MG PO TABS
15.0000 mg | ORAL_TABLET | Freq: Two times a day (BID) | ORAL | 1 refills | Status: DC
Start: 2023-08-20 — End: 2023-11-26

## 2023-08-20 NOTE — Progress Notes (Signed)
Subjective:     Patient ID: Lindsay Frost, female    DOB: [birth year], 67 y.o.   MRN: [id number]  Chief Complaint  Patient presents with   Depression    Here for follow up   Weight Loss    Here for follow up    HPI  Discussed the use of AI scribe software for clinical note transcription with the patient, who gave verbal consent to proceed.  History of Present Illness   The patient, with a history of anxiety, depression, and recent surgery, presents with ongoing anxiety and new onset constipation. She recently discontinued sertraline due to diarrhea, which resolved after discontinuation. She has been on mirtazapine for a short period of time and reports no significant improvement in mood, but has noticed an improvement in her appetite as well as some weight gain which she is pleased about. She has not had a bowel movement in three days, which is a change from her usual daily bowel movements since her surgery. She is interested in starting a motility medication previously mentioned by another provider.  Her anxiety remains a significant issue. She reports that buspirone provides some relief, but does not completely alleviate her symptoms. She also reports ongoing issues with sleep. She has been taking alprazolam, which she believes helps with sleep. She has also noticed a slight increase in appetite since starting mirtazapine, which is beneficial as she has been struggling with weight loss and lack of appetite since her surgery.  She also reports that she was advised to discontinue her blood pressure medication, amlodipine, upon discharge from the hospital. She has not been taking it, but did take a dose last week when she had a headache and noticed her blood pressure was elevated.     Wt Readings from Last 3 Encounters:  08/20/23 101 lb 12.8 oz (46.2 kg)  07/16/23 104 lb (47.2 kg)  07/11/23 110 lb 0.2 oz (49.9 kg)        Health Maintenance Due  Topic Date Due   Zoster Vaccines-  Shingrix (1 of 2) Never done   COVID-19 Vaccine (1 - 2023-24 season) Never done   DTaP/Tdap/Td (2 - Td or Tdap) 08/06/2023    Past Medical History:  Diagnosis Date   Acute midline thoracic back pain 02/16/2021   Acute sinusitis 09/05/2014   Allergic rhinitis 07/16/2016   Allergy 2005   Anemia 2021   Arthritis    B12 deficiency 01/02/2021   Back pain    arthritis   Chronic sinusitis 12/05/2020   Depression    takes cymbalta daily   Diarrhea 02/16/2021   Duodenal stenosis    Epigastric pain 07/29/2018   Essential hypertension 05/14/2019   Gallstones    Generalized abdominal pain 02/16/2021   GERD (gastroesophageal reflux disease)    takes Omeprazole daily   Headache    Heart murmur    Hiatal hernia    History of gastric ulcer    History of migraine    last one about 2wks ago-takes Imitrex prn   History of seizures    Joint pain    Joint swelling    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   Meningioma (HCC)    Migraine without aura and without status migrainosus, not intractable 01/13/2015   Mixed dyslipidemia 07/25/2020   Myringotomy tube status 02/27/2021   Nausea 02/16/2021   Nocturnal leg cramps 02/20/2015   Obesity (BMI 30-39.9) 07/22/2014   Osteoarthritis of left knee 12/02/2012   Overactive  bladder 01/02/2021   Peptic ulcer disease    Physical exam 07/22/2014   Recurrent acute serous otitis media of left ear 12/05/2020   Seizure-like activity (HCC) 05/12/2014    Past Surgical History:  Procedure Laterality Date   BALLOON DILATION  04/03/2012   Procedure: BALLOON DILATION;  Surgeon: Theda Belfast, MD;  Location: [facility];  Service: Endoscopy;  Laterality: N/A;   BIOPSY  08/24/2019   Procedure: BIOPSY;  Surgeon: Shellia Cleverly, DO;  Location: [facility];  Service: Gastroenterology;;  EGD and COLON   BRAVO PH STUDY N/A 08/24/2019   Procedure: BRAVO PH STUDY;  Surgeon: Shellia Cleverly, DO;  Location:  [facility];  Service: Gastroenterology;  Laterality: N/A;   CHOLECYSTECTOMY N/A 04/24/2018   Procedure: LAPAROSCOPIC CHOLECYSTECTOMY;  Surgeon: Berna Bue, MD;  Location: [facility];  Service: General;  Laterality: N/A;   COLONOSCOPY     COLONOSCOPY WITH PROPOFOL N/A 08/24/2019   Procedure: COLONOSCOPY WITH PROPOFOL;  Surgeon: Shellia Cleverly, DO;  Location: [facility];  Service: Gastroenterology;  Laterality: N/A;   DIAGNOSTIC LAPAROSCOPY  74yrs ago   ESOPHAGEAL MANOMETRY N/A 09/04/2015   Procedure: ESOPHAGEAL MANOMETRY (EM);  Surgeon: Jeani Hawking, MD;  Location: [facility];  Service: Endoscopy;  Laterality: N/A;   ESOPHAGEAL MANOMETRY N/A 12/25/2022   Procedure: ESOPHAGEAL MANOMETRY (EM);  Surgeon: Shellia Cleverly, DO;  Location: [facility];  Service: Gastroenterology;  Laterality: N/A;   ESOPHAGOGASTRODUODENOSCOPY  04/03/2012   Procedure: ESOPHAGOGASTRODUODENOSCOPY (EGD);  Surgeon: Theda Belfast, MD;  Location: [facility];  Service: Endoscopy;  Laterality: N/A;  EGD w/ balloon dilation   ESOPHAGOGASTRODUODENOSCOPY (EGD) WITH PROPOFOL N/A 02/18/2014   Procedure: ESOPHAGOGASTRODUODENOSCOPY (EGD) WITH PROPOFOL;  Surgeon: Theda Belfast, MD;  Location: [facility];  Service: Endoscopy;  Laterality: N/A;   ESOPHAGOGASTRODUODENOSCOPY (EGD) WITH PROPOFOL N/A 04/22/2014   Procedure: ESOPHAGOGASTRODUODENOSCOPY (EGD) WITH PROPOFOL;  Surgeon: Theda Belfast, MD;  Location: [facility];  Service: Endoscopy;  Laterality: N/A;   ESOPHAGOGASTRODUODENOSCOPY (EGD) WITH PROPOFOL N/A 08/24/2019   Procedure: ESOPHAGOGASTRODUODENOSCOPY (EGD) WITH PROPOFOL;  Surgeon: Shellia Cleverly, DO;  Location: [facility];  Service: Gastroenterology;  Laterality: N/A;   EYE SURGERY Bilateral 2021   GASTRECTOMY N/A 06/06/2023   Procedure: DISTAL GASTRECTOMY;  Surgeon: Gaynelle Adu, MD;  Location[facility] OR;  Service: General;  Laterality: N/A;   GASTROSTOMY N/A 06/06/2023   Procedure: PLACEMENT OF GASTRIC  AND JEJUNAL FEEDING TUBES;  Surgeon: Gaynelle Adu, MD;  Location: [facility] OR;  Service: General;  Laterality: N/A;   IR REPLC GASTRO/COLONIC TUBE PERCUT W/FLUORO  07/07/2023   LAPAROTOMY N/A 06/06/2023   Procedure: EXPLORATION LAPAROTOMY;  Surgeon: Gaynelle Adu, MD;  Location: [facility] OR;  Service: General;  Laterality: N/A;   LAPAROTOMY N/A 06/18/2023   Procedure: EXPLORATION LAPAROTOMY, INSERTION OF JEJUNAL FEEDING TUBE;  Surgeon: Andria Meuse, MD;  Location: [facility] OR;  Service: General;  Laterality: N/A;   LEFT HEART CATH AND CORONARY ANGIOGRAPHY N/A 04/18/2021   Procedure: LEFT HEART CATH AND CORONARY ANGIOGRAPHY;  Surgeon: Runell Gess, MD;  Location: [facility];  Service: Cardiovascular;  Laterality: N/A;   NASAL SEPTUM SURGERY  09/2016   PH IMPEDANCE STUDY N/A 09/04/2015   Procedure: PH IMPEDANCE STUDY;  Surgeon: Jeani Hawking, MD;  Location: [facility];  Service: Endoscopy;  Laterality: N/A;   REPLACEMENT TOTAL KNEE Right 18yrs ago   TOTAL KNEE ARTHROPLASTY Left 11/30/2012   Procedure: LEFT TOTAL KNEE ARTHROPLASTY;  Surgeon: Nestor Lewandowsky, MD;  Location: [facility]  OR;  Service: Orthopedics;  Laterality: Left;    Family History  Problem Relation Age of Onset   Arthritis Mother    Cancer Mother        breast   Heart disease Mother        died from "heart problems" at age 48, had CAD   Depression Mother    Heart disease Father        had CABG   Kidney disease Father    Arthritis Sister    Heart murmur Sister    Arthritis Sister    Aneurysm Sister    Arthritis Brother        "serious back/neck problems"   Healthy Daughter    Colon cancer Neg Hx    Esophageal cancer Neg Hx    Pancreatic cancer Neg Hx    Stomach cancer Neg Hx    Rectal cancer Neg Hx     Social History   Socioeconomic History   Marital status: Widowed    Spouse name: Not on file   Number of children: 1   Years of education: Not on file   Highest education level: 12th grade  Occupational History    Occupation: Retired  Tobacco Use   Smoking status: Never   Smokeless tobacco: Never  Vaping Use   Vaping status: Never Used  Substance and Sexual Activity   Alcohol use: No   Drug use: No   Sexual activity: Not Currently    Birth control/protection: Post-menopausal  Other Topics Concern   Not on file  Social History Narrative   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   Single   1 daughter- lives in [place]   2 dogs   Enjoys reading, watching television   Pt is right handed   Drinks coffee once a week, green tea daily, soda sometimes    Social Determinants of Health   Financial Resource Strain: Low Risk  (07/16/2023)   Overall Financial Resource Strain (CARDIA)    Difficulty of Paying Living Expenses: Not very hard  Food Insecurity: No Food Insecurity (07/16/2023)   Hunger Vital Sign    Worried About Running Out of Food in the Last Year: Never true    Ran Out of Food in the Last Year: Never true  Transportation Needs: No Transportation Needs (07/16/2023)   PRAPARE - Administrator, Civil Service (Medical): No    Lack of Transportation (Non-Medical): No  Physical Activity: Insufficiently Active (07/16/2023)   Exercise Vital Sign    Days of Exercise per Week: 2 days    Minutes of Exercise per Session: 10 min  Stress: Stress Concern Present (07/16/2023)   [employer] of Occupational Health - Occupational Stress Questionnaire    Feeling of Stress : Rather much  Social Connections: Moderately Integrated (07/16/2023)   Social Connection and Isolation Panel [NHANES]    Frequency of Communication with Friends and Family: More than three times a week    Frequency of Social Gatherings with Friends and Family: Once a week    Attends Religious Services: More than 4 times per year    Active Member of [employer] or Organizations: Yes    Attends Banker Meetings: More than 4 times per year    Marital Status: Widowed  Intimate  Partner Violence: Not At Risk (06/19/2023)   Humiliation, Afraid, Rape, and Kick questionnaire    Fear of Current or Ex-Partner: No    Emotionally Abused: No  Physically Abused: No    Sexually Abused: No    Outpatient Medications Prior to Visit  Medication Sig Dispense Refill   acetaminophen (TYLENOL) 500 MG tablet Take 2 tablets (1,000 mg total) by mouth 4 (four) times daily. 30 tablet 0   albuterol (VENTOLIN HFA) 108 (90 Base) MCG/ACT inhaler Inhale 2 puffs into the lungs every 6 (six) hours as needed for wheezing or shortness of breath. 8 g 1   alendronate (FOSAMAX) 70 MG tablet Take 1 tablet (70 mg total) by mouth every 7 (seven) days. Take with a full glass of water on an empty stomach. 4 tablet 11   aspirin EC 81 MG tablet Take 1 tablet (81 mg total) by mouth daily. Swallow whole. 90 tablet 3   atorvastatin (LIPITOR) 20 MG tablet TAKE 1 TABLET(20 MG) BY MOUTH DAILY 90 tablet 1   eletriptan (RELPAX) 40 MG tablet Take 1 tablet (40 mg total) by mouth as needed for migraine or headache. May repeat in 2 hours if headache persists or recurs. 10 tablet 5   fluticasone (FLONASE) 50 MCG/ACT nasal spray SHAKE LIQUID AND USE 2 SPRAYS IN EACH NOSTRIL DAILY 48 g 3   levETIRAcetam (KEPPRA) 1000 MG tablet Take 1 tablet (1,000 mg total) by mouth 2 (two) times daily. Take 1 tablet twice daily for one week, then increase to 2 tablets twice daily.     lidocaine (LIDODERM) 5 % Place 1 patch onto the skin daily. Remove & Discard patch within 12 hours or as directed by MD 10 patch 0   liver oil-zinc oxide (DESITIN) 40 % ointment Apply topically 2 (two) times daily.     methocarbamol (ROBAXIN) 500 MG tablet Take 2 tablets (1,000 mg total) by mouth every 6 (six) hours as needed for muscle spasms. 80 tablet 0   mirabegron ER (MYRBETRIQ) 25 MG TB24 tablet TAKE 1 TABLET(25 MG) BY MOUTH DAILY 90 tablet 1   mirtazapine (REMERON) 15 MG tablet Take 1 tablet (15 mg total) by mouth at bedtime. 30 tablet 1   Multiple  Vitamins-Iron (MULTIVITAMINS WITH IRON) TABS tablet Take 1 tablet by mouth daily.     nitroGLYCERIN (NITROSTAT) 0.4 MG SL tablet Place 0.4 mg under the tongue every 5 (five) minutes as needed for chest pain.     ondansetron (ZOFRAN-ODT) 4 MG disintegrating tablet Take 1 tablet (4 mg total) by mouth every 6 (six) hours as needed for nausea. 20 tablet 0   pantoprazole (PROTONIX) 40 MG tablet Take 1 tablet (40 mg total) by mouth at bedtime. 30 tablet 0   RESTASIS 0.05 % ophthalmic emulsion Place 1 drop into both eyes 2 (two) times daily.     vitamin D3 (CHOLECALCIFEROL) 25 MCG tablet Take 1 tablet (1,000 Units total) by mouth daily. 100 tablet 0   ALPRAZolam (XANAX) 0.5 MG tablet Take 1 tablet (0.5 mg total) by mouth 2 (two) times daily as needed for anxiety. 30 tablet 0   busPIRone (BUSPAR) 7.5 MG tablet Take 1 tablet (7.5 mg total) by mouth 2 (two) times daily. 60 tablet 2   metoCLOPramide (REGLAN) 10 MG tablet Take 1 tablet (10 mg total) by mouth 4 (four) times daily -  before meals and at bedtime for 14 days. 56 tablet 0   No facility-administered medications prior to visit.    Allergies  Allergen Reactions   Naratriptan     Ineffective    ROS See HPI    Objective:    Physical Exam Constitutional:  General: She is not in acute distress.    Appearance: Normal appearance. She is well-developed.  HENT:     Head: Normocephalic and atraumatic.     Right Ear: External ear normal.     Left Ear: External ear normal.  Eyes:     General: No scleral icterus. Neck:     Thyroid: No thyromegaly.  Cardiovascular:     Rate and Rhythm: Normal rate and regular rhythm.     Heart sounds: Normal heart sounds. No murmur heard. Pulmonary:     Effort: Pulmonary effort is normal. No respiratory distress.     Breath sounds: Normal breath sounds. No wheezing.  Musculoskeletal:     Cervical back: Neck supple.  Skin:    General: Skin is warm and dry.  Neurological:     Mental Status: She is  alert and oriented to person, place, and time.  Psychiatric:        Mood and Affect: Mood normal.        Behavior: Behavior normal.        Thought Content: Thought content normal.        Judgment: Judgment normal.      BP 130/77 (BP Location: Right Arm, Patient Position: Sitting, Cuff Size: Small)   Pulse 91   Temp 98.1 F (36.7 C) (Oral)   Resp 16   Ht 5\' 4"  (1.626 m)   Wt 101 lb 12.8 oz (46.2 kg)   SpO2 100%   BMI 17.47 kg/m  Wt Readings from Last 3 Encounters:  08/20/23 101 lb 12.8 oz (46.2 kg)  07/16/23 104 lb (47.2 kg)  07/11/23 110 lb 0.2 oz (49.9 kg)       Assessment & Plan:   Problem List Items Addressed This Visit       Unprioritized   Hypertension     Patient stopped taking Amlodipine due to hospital discharge instructions. -BP looks good today without amlodipine.   -Advise patient to monitor blood pressure daily for a week and send readings for review.      Depression with anxiety - Primary    Anxiety is uncontrolled.   Persistent despite current treatment with Buspar 7.5mg . No significant improvement noted with prior Sertraline use. -Increase Buspar to 15mg  twice daily. -Continue Xanax as needed for anxiety.      Relevant Medications   busPIRone (BUSPAR) 15 MG tablet   ALPRAZolam (XANAX) 0.5 MG tablet   Constipation     New onset since starting Mirtazapine. -Recommend patient to contact Dr. Barron Alvine regarding potential use of a motility agent.      B12 deficiency     Last injection given in the summer. -Administer B12 injection today and plan for monthly injections.       I have discontinued Olegario Messier H. Escalera's busPIRone. I am also having her start on busPIRone. Additionally, I am having her maintain her alendronate, aspirin EC, nitroGLYCERIN, fluticasone, Restasis, atorvastatin, mirabegron ER, albuterol, eletriptan, acetaminophen, metoCLOPramide, ondansetron, pantoprazole, methocarbamol, multivitamins with iron, lidocaine, liver oil-zinc  oxide, vitamin D3, mirtazapine, levETIRAcetam, and ALPRAZolam. We administered cyanocobalamin.  Meds ordered this encounter  Medications   busPIRone (BUSPAR) 15 MG tablet    Sig: Take 1 tablet (15 mg total) by mouth 2 (two) times daily.    Dispense:  180 tablet    Refill:  1    Order Specific Question:   Supervising Provider    Answer:   Danise Edge A [[id number]]   ALPRAZolam (XANAX) 0.5 MG tablet    Sig:  Take 1 tablet (0.5 mg total) by mouth 2 (two) times daily as needed for anxiety.    Dispense:  45 tablet    Refill:  0    Order Specific Question:   Supervising Provider    Answer:   Danise Edge A [[id number]]   cyanocobalamin (VITAMIN B12) injection 1,000 mcg

## 2023-08-21 DIAGNOSIS — N3281 Overactive bladder: Secondary | ICD-10-CM | POA: Diagnosis not present

## 2023-08-21 DIAGNOSIS — M1990 Unspecified osteoarthritis, unspecified site: Secondary | ICD-10-CM | POA: Diagnosis not present

## 2023-08-21 DIAGNOSIS — E538 Deficiency of other specified B group vitamins: Secondary | ICD-10-CM | POA: Diagnosis not present

## 2023-08-21 DIAGNOSIS — J309 Allergic rhinitis, unspecified: Secondary | ICD-10-CM | POA: Diagnosis not present

## 2023-08-21 DIAGNOSIS — H90A32 Mixed conductive and sensorineural hearing loss, unilateral, left ear with restricted hearing on the contralateral side: Secondary | ICD-10-CM | POA: Diagnosis not present

## 2023-08-21 DIAGNOSIS — G43009 Migraine without aura, not intractable, without status migrainosus: Secondary | ICD-10-CM | POA: Diagnosis not present

## 2023-08-21 DIAGNOSIS — M549 Dorsalgia, unspecified: Secondary | ICD-10-CM | POA: Diagnosis not present

## 2023-08-21 DIAGNOSIS — I2510 Atherosclerotic heart disease of native coronary artery without angina pectoris: Secondary | ICD-10-CM | POA: Diagnosis not present

## 2023-08-21 DIAGNOSIS — I10 Essential (primary) hypertension: Secondary | ICD-10-CM | POA: Diagnosis not present

## 2023-08-21 DIAGNOSIS — D649 Anemia, unspecified: Secondary | ICD-10-CM | POA: Diagnosis not present

## 2023-08-21 DIAGNOSIS — G8929 Other chronic pain: Secondary | ICD-10-CM | POA: Diagnosis not present

## 2023-08-21 DIAGNOSIS — R3911 Hesitancy of micturition: Secondary | ICD-10-CM | POA: Diagnosis not present

## 2023-08-21 DIAGNOSIS — E782 Mixed hyperlipidemia: Secondary | ICD-10-CM | POA: Diagnosis not present

## 2023-08-21 DIAGNOSIS — Z434 Encounter for attention to other artificial openings of digestive tract: Secondary | ICD-10-CM | POA: Diagnosis not present

## 2023-08-21 DIAGNOSIS — K2100 Gastro-esophageal reflux disease with esophagitis, without bleeding: Secondary | ICD-10-CM | POA: Diagnosis not present

## 2023-08-21 DIAGNOSIS — Z48815 Encounter for surgical aftercare following surgery on the digestive system: Secondary | ICD-10-CM | POA: Diagnosis not present

## 2023-08-21 DIAGNOSIS — J984 Other disorders of lung: Secondary | ICD-10-CM | POA: Diagnosis not present

## 2023-08-21 DIAGNOSIS — Z431 Encounter for attention to gastrostomy: Secondary | ICD-10-CM | POA: Diagnosis not present

## 2023-08-21 DIAGNOSIS — E43 Unspecified severe protein-calorie malnutrition: Secondary | ICD-10-CM | POA: Diagnosis not present

## 2023-08-21 DIAGNOSIS — K449 Diaphragmatic hernia without obstruction or gangrene: Secondary | ICD-10-CM | POA: Diagnosis not present

## 2023-08-21 MED ORDER — MOTEGRITY 2 MG PO TABS: 1.0000 | ORAL_TABLET | Freq: Every day | ORAL | 3 refills | Status: DC

## 2023-08-22 NOTE — Assessment & Plan Note (Signed)
  Patient stopped taking Amlodipine due to hospital discharge instructions. -BP looks good today without amlodipine.   -Advise patient to monitor blood pressure daily for a week and send readings for review.

## 2023-08-22 NOTE — Assessment & Plan Note (Signed)
Anxiety is uncontrolled.   Persistent despite current treatment with Buspar 7.5mg . No significant improvement noted with prior Sertraline use. -Increase Buspar to 15mg  twice daily. -Continue Xanax as needed for anxiety.

## 2023-08-22 NOTE — Assessment & Plan Note (Signed)
  Last injection given in the summer. -Administer B12 injection today and plan for monthly injections.

## 2023-08-22 NOTE — Assessment & Plan Note (Signed)
  New onset since starting Mirtazapine. -Recommend patient to contact Dr. Barron Alvine regarding potential use of a motility agent.

## 2023-08-22 NOTE — Patient Instructions (Signed)
VISIT SUMMARY:  During today's visit, we discussed your ongoing anxiety, new onset constipation, insomnia, and blood pressure management. We also addressed your vitamin B12 deficiency and planned for your general health maintenance.  YOUR PLAN:  -ANXIETY: Anxiety is a feeling of worry or fear that can be persistent and overwhelming. We will increase your Buspar dosage to 15mg  twice daily and continue Xanax as needed to help manage your symptoms.  -INSOMNIA: Insomnia is difficulty falling or staying asleep. We will continue Mirtazapine, which may also help improve your mood and stimulate your appetite.  -CONSTIPATION: Constipation is having fewer bowel movements than usual or difficulty passing stools. Since this started after you began taking Mirtazapine, please contact Dr. Barron Alvine about possibly using a motility agent to help with this issue.  -HYPERTENSION: Hypertension is high blood pressure. Since you stopped taking Amlodipine, please monitor your blood pressure daily for a week and send [place] the readings for review.  -VITAMIN B12 DEFICIENCY: Vitamin B12 deficiency can cause fatigue and weakness. You received a B12 injection today, and we will plan for monthly injections to maintain your levels.  INSTRUCTIONS:  Please schedule a follow-up visit in one month to assess your medication adjustments and administer your next B12 injection. This follow-up can be a virtual visit, but you will need a separate visit for the B12 injection.

## 2023-08-28 DIAGNOSIS — Z434 Encounter for attention to other artificial openings of digestive tract: Secondary | ICD-10-CM | POA: Diagnosis not present

## 2023-08-28 DIAGNOSIS — I2510 Atherosclerotic heart disease of native coronary artery without angina pectoris: Secondary | ICD-10-CM | POA: Diagnosis not present

## 2023-08-28 DIAGNOSIS — J309 Allergic rhinitis, unspecified: Secondary | ICD-10-CM | POA: Diagnosis not present

## 2023-08-28 DIAGNOSIS — M1990 Unspecified osteoarthritis, unspecified site: Secondary | ICD-10-CM | POA: Diagnosis not present

## 2023-08-28 DIAGNOSIS — M549 Dorsalgia, unspecified: Secondary | ICD-10-CM | POA: Diagnosis not present

## 2023-08-28 DIAGNOSIS — I10 Essential (primary) hypertension: Secondary | ICD-10-CM | POA: Diagnosis not present

## 2023-08-28 DIAGNOSIS — K449 Diaphragmatic hernia without obstruction or gangrene: Secondary | ICD-10-CM | POA: Diagnosis not present

## 2023-08-28 DIAGNOSIS — G8929 Other chronic pain: Secondary | ICD-10-CM | POA: Diagnosis not present

## 2023-08-28 DIAGNOSIS — Z431 Encounter for attention to gastrostomy: Secondary | ICD-10-CM | POA: Diagnosis not present

## 2023-08-28 DIAGNOSIS — E43 Unspecified severe protein-calorie malnutrition: Secondary | ICD-10-CM | POA: Diagnosis not present

## 2023-08-28 DIAGNOSIS — N3281 Overactive bladder: Secondary | ICD-10-CM | POA: Diagnosis not present

## 2023-08-28 DIAGNOSIS — K2100 Gastro-esophageal reflux disease with esophagitis, without bleeding: Secondary | ICD-10-CM | POA: Diagnosis not present

## 2023-08-28 DIAGNOSIS — Z48815 Encounter for surgical aftercare following surgery on the digestive system: Secondary | ICD-10-CM | POA: Diagnosis not present

## 2023-08-28 DIAGNOSIS — J984 Other disorders of lung: Secondary | ICD-10-CM | POA: Diagnosis not present

## 2023-08-28 DIAGNOSIS — R3911 Hesitancy of micturition: Secondary | ICD-10-CM | POA: Diagnosis not present

## 2023-08-28 DIAGNOSIS — D649 Anemia, unspecified: Secondary | ICD-10-CM | POA: Diagnosis not present

## 2023-08-28 DIAGNOSIS — E782 Mixed hyperlipidemia: Secondary | ICD-10-CM | POA: Diagnosis not present

## 2023-08-28 DIAGNOSIS — G43009 Migraine without aura, not intractable, without status migrainosus: Secondary | ICD-10-CM | POA: Diagnosis not present

## 2023-08-28 DIAGNOSIS — H90A32 Mixed conductive and sensorineural hearing loss, unilateral, left ear with restricted hearing on the contralateral side: Secondary | ICD-10-CM | POA: Diagnosis not present

## 2023-08-28 DIAGNOSIS — E538 Deficiency of other specified B group vitamins: Secondary | ICD-10-CM | POA: Diagnosis not present

## 2023-09-05 DIAGNOSIS — Z48815 Encounter for surgical aftercare following surgery on the digestive system: Secondary | ICD-10-CM | POA: Diagnosis not present

## 2023-09-05 DIAGNOSIS — D649 Anemia, unspecified: Secondary | ICD-10-CM | POA: Diagnosis not present

## 2023-09-05 DIAGNOSIS — E538 Deficiency of other specified B group vitamins: Secondary | ICD-10-CM | POA: Diagnosis not present

## 2023-09-05 DIAGNOSIS — H90A32 Mixed conductive and sensorineural hearing loss, unilateral, left ear with restricted hearing on the contralateral side: Secondary | ICD-10-CM | POA: Diagnosis not present

## 2023-09-05 DIAGNOSIS — K2100 Gastro-esophageal reflux disease with esophagitis, without bleeding: Secondary | ICD-10-CM | POA: Diagnosis not present

## 2023-09-05 DIAGNOSIS — J309 Allergic rhinitis, unspecified: Secondary | ICD-10-CM | POA: Diagnosis not present

## 2023-09-05 DIAGNOSIS — I2510 Atherosclerotic heart disease of native coronary artery without angina pectoris: Secondary | ICD-10-CM | POA: Diagnosis not present

## 2023-09-05 DIAGNOSIS — R3911 Hesitancy of micturition: Secondary | ICD-10-CM | POA: Diagnosis not present

## 2023-09-05 DIAGNOSIS — Z434 Encounter for attention to other artificial openings of digestive tract: Secondary | ICD-10-CM | POA: Diagnosis not present

## 2023-09-05 DIAGNOSIS — N3281 Overactive bladder: Secondary | ICD-10-CM | POA: Diagnosis not present

## 2023-09-05 DIAGNOSIS — G43009 Migraine without aura, not intractable, without status migrainosus: Secondary | ICD-10-CM | POA: Diagnosis not present

## 2023-09-05 DIAGNOSIS — M1990 Unspecified osteoarthritis, unspecified site: Secondary | ICD-10-CM | POA: Diagnosis not present

## 2023-09-05 DIAGNOSIS — K449 Diaphragmatic hernia without obstruction or gangrene: Secondary | ICD-10-CM | POA: Diagnosis not present

## 2023-09-05 DIAGNOSIS — M549 Dorsalgia, unspecified: Secondary | ICD-10-CM | POA: Diagnosis not present

## 2023-09-05 DIAGNOSIS — E782 Mixed hyperlipidemia: Secondary | ICD-10-CM | POA: Diagnosis not present

## 2023-09-05 DIAGNOSIS — G8929 Other chronic pain: Secondary | ICD-10-CM | POA: Diagnosis not present

## 2023-09-05 DIAGNOSIS — J984 Other disorders of lung: Secondary | ICD-10-CM | POA: Diagnosis not present

## 2023-09-05 DIAGNOSIS — E43 Unspecified severe protein-calorie malnutrition: Secondary | ICD-10-CM | POA: Diagnosis not present

## 2023-09-05 DIAGNOSIS — I10 Essential (primary) hypertension: Secondary | ICD-10-CM | POA: Diagnosis not present

## 2023-09-05 DIAGNOSIS — Z431 Encounter for attention to gastrostomy: Secondary | ICD-10-CM | POA: Diagnosis not present

## 2023-09-08 DIAGNOSIS — E43 Unspecified severe protein-calorie malnutrition: Secondary | ICD-10-CM | POA: Diagnosis not present

## 2023-09-08 DIAGNOSIS — I10 Essential (primary) hypertension: Secondary | ICD-10-CM | POA: Diagnosis not present

## 2023-09-08 DIAGNOSIS — K2100 Gastro-esophageal reflux disease with esophagitis, without bleeding: Secondary | ICD-10-CM | POA: Diagnosis not present

## 2023-09-08 DIAGNOSIS — G43009 Migraine without aura, not intractable, without status migrainosus: Secondary | ICD-10-CM | POA: Diagnosis not present

## 2023-09-08 DIAGNOSIS — I2510 Atherosclerotic heart disease of native coronary artery without angina pectoris: Secondary | ICD-10-CM | POA: Diagnosis not present

## 2023-09-08 DIAGNOSIS — D649 Anemia, unspecified: Secondary | ICD-10-CM | POA: Diagnosis not present

## 2023-09-08 DIAGNOSIS — Z434 Encounter for attention to other artificial openings of digestive tract: Secondary | ICD-10-CM | POA: Diagnosis not present

## 2023-09-08 DIAGNOSIS — K449 Diaphragmatic hernia without obstruction or gangrene: Secondary | ICD-10-CM | POA: Diagnosis not present

## 2023-09-08 DIAGNOSIS — M549 Dorsalgia, unspecified: Secondary | ICD-10-CM | POA: Diagnosis not present

## 2023-09-08 DIAGNOSIS — M1990 Unspecified osteoarthritis, unspecified site: Secondary | ICD-10-CM | POA: Diagnosis not present

## 2023-09-08 DIAGNOSIS — E782 Mixed hyperlipidemia: Secondary | ICD-10-CM | POA: Diagnosis not present

## 2023-09-08 DIAGNOSIS — R3911 Hesitancy of micturition: Secondary | ICD-10-CM | POA: Diagnosis not present

## 2023-09-08 DIAGNOSIS — N3281 Overactive bladder: Secondary | ICD-10-CM | POA: Diagnosis not present

## 2023-09-08 DIAGNOSIS — G8929 Other chronic pain: Secondary | ICD-10-CM | POA: Diagnosis not present

## 2023-09-08 DIAGNOSIS — E538 Deficiency of other specified B group vitamins: Secondary | ICD-10-CM | POA: Diagnosis not present

## 2023-09-08 DIAGNOSIS — H90A32 Mixed conductive and sensorineural hearing loss, unilateral, left ear with restricted hearing on the contralateral side: Secondary | ICD-10-CM | POA: Diagnosis not present

## 2023-09-08 DIAGNOSIS — J309 Allergic rhinitis, unspecified: Secondary | ICD-10-CM | POA: Diagnosis not present

## 2023-09-08 DIAGNOSIS — Z431 Encounter for attention to gastrostomy: Secondary | ICD-10-CM | POA: Diagnosis not present

## 2023-09-08 DIAGNOSIS — J984 Other disorders of lung: Secondary | ICD-10-CM | POA: Diagnosis not present

## 2023-09-08 DIAGNOSIS — Z48815 Encounter for surgical aftercare following surgery on the digestive system: Secondary | ICD-10-CM | POA: Diagnosis not present

## 2023-09-11 ENCOUNTER — Other Ambulatory Visit: Payer: Self-pay | Admitting: Family

## 2023-09-11 DIAGNOSIS — N3281 Overactive bladder: Secondary | ICD-10-CM

## 2023-09-22 ENCOUNTER — Encounter: Payer: Self-pay | Admitting: Neurology

## 2023-09-24 ENCOUNTER — Ambulatory Visit: Payer: 59 | Admitting: Family

## 2023-09-24 VITALS — BP 134/85 | HR 85 | Temp 97.9°F | Resp 16 | Ht 64.0 in | Wt 110.0 lb

## 2023-09-24 DIAGNOSIS — R634 Abnormal weight loss: Secondary | ICD-10-CM

## 2023-09-24 DIAGNOSIS — F418 Other specified anxiety disorders: Secondary | ICD-10-CM | POA: Diagnosis not present

## 2023-09-24 DIAGNOSIS — E538 Deficiency of other specified B group vitamins: Secondary | ICD-10-CM

## 2023-09-24 DIAGNOSIS — K5900 Constipation, unspecified: Secondary | ICD-10-CM | POA: Diagnosis not present

## 2023-09-24 MED ORDER — CYANOCOBALAMIN 1000 MCG/ML IJ SOLN
1000.0000 ug | Freq: Once | INTRAMUSCULAR | Status: AC
  Administered 2023-09-24: 1000 ug via INTRAMUSCULAR

## 2023-09-24 NOTE — Assessment & Plan Note (Signed)
 Depression is stable, anxiety is improved with increase in buspar from 7.5mg  to 15mg .  Continue buspar 15mg .

## 2023-09-24 NOTE — Assessment & Plan Note (Signed)
 B12 injection today

## 2023-09-24 NOTE — Assessment & Plan Note (Signed)
 Wt Readings from Last 3 Encounters:  09/24/23 110 lb (49.9 kg)  08/20/23 101 lb 12.8 oz (46.2 kg)  07/16/23 104 lb (47.2 kg)   Weight is improving.  Monitor.

## 2023-09-24 NOTE — Patient Instructions (Signed)
 VISIT SUMMARY:  During today's visit, we discussed your ongoing anxiety, sleep issues, and history of constipation. We reviewed your current medications and made plans to continue with your current treatment regimen. Your blood pressure was within normal limits, and you received your scheduled B12 injection.  YOUR PLAN:  -ANXIETY: Anxiety is a condition characterized by feelings of worry, nervousness, or fear that are strong enough to interfere with one's daily activities. Your anxiety has shown some improvement with the increased dose of Buspar  to 15mg  twice daily. You should continue taking Buspar  at this dose and use Xanax  as needed for sleep and occasional daytime anxiety.  -INSOMNIA: Insomnia is a sleep disorder where you have trouble falling or staying asleep. You are experiencing early morning awakenings but are not consuming caffeine in the afternoons. We will continue with your current management plan and monitor for any changes.  -CONSTIPATION: Constipation is a condition where you have difficulty emptying your bowels, often associated with hardened stool. Your constipation has improved with increased dietary fiber and the use of Miralax. Continue with your current dietary changes and use Miralax as needed.  -HYPERTENSION: Hypertension is high blood pressure. Your blood pressure is currently within normal limits, and you are not on any medication for it. We will continue to monitor your blood pressure, but no medication is needed at this time.  -VITAMIN B12 DEFICIENCY: Vitamin B12 deficiency occurs when your body does not have enough of this essential vitamin, which is important for nerve function and the production of red blood cells. You received your B12 injection today as scheduled.  INSTRUCTIONS:  Please follow up in three months for a re-evaluation of your anxiety, sleep issues, and blood pressure. Continue with your current medications and dietary changes as discussed. If you  experience any new symptoms or have concerns before your next appointment, do not hesitate to contact our office.

## 2023-09-24 NOTE — Progress Notes (Signed)
 Subjective:     Patient ID: Lindsay Frost, female    DOB: September 15, 1956, 68 y.o.   MRN: [id number]  Chief Complaint  Patient presents with   Anxiety    Here for follow up, doing better    B12 deficiency    Due for B12 today    Anxiety      Discussed the use of AI scribe software for clinical note transcription with the patient, who gave verbal consent to proceed.  History of Present Illness   The patient, with a history of anxiety, presents for a follow-up visit. Her sister accompanies her to today's visit. She reports some improvement in her anxiety symptoms since the last visit, when the dose of Buspar  was increased from 7.5mg  to 15mg  twice daily. However, she still experiences some anxiety and uses Xanax  as needed, usually once at night to aid sleep and occasionally in the afternoon. Despite this, she reports difficulty maintaining sleep, often waking up around 2 or 3 in the morning and struggling to fall back asleep. She denies caffeine intake in the afternoons. She also mentions a history of surgery and a prolonged hospital stay during which she received Dilaudid  and oxycodone  for pain management. She wonders if her current sleep issues could be related to withdrawal from these medications.  In addition to her anxiety and sleep issues, the patient also reports a history of constipation. However, she states that this has improved with increased dietary fiber intake and the use of Miralax. She denies current use of blood pressure medication, and her blood pressure was noted to be within normal limits at this visit. She also received a B12 shot at her last visit and is due for another one today.          Health Maintenance Due  Topic Date Due   Zoster Vaccines- Shingrix (1 of 2) Never done   COVID-19 Vaccine (1 - 2024-25 season) Never done   DTaP/Tdap/Td (2 - Td or Tdap) 08/06/2023   Medicare Annual Wellness (AWV)  11/14/2023    Past Medical History:  Diagnosis Date    Acute midline thoracic back pain 02/16/2021   Acute sinusitis 09/05/2014   Allergic rhinitis 07/16/2016   Allergy 2005   Anemia 2021   Arthritis    B12 deficiency 01/02/2021   Back pain    arthritis   Chronic sinusitis 12/05/2020   Depression    takes cymbalta  daily   Diarrhea 02/16/2021   Duodenal stenosis    Epigastric pain 07/29/2018   Essential hypertension 05/14/2019   Gallstones    Generalized abdominal pain 02/16/2021   GERD (gastroesophageal reflux disease)    takes Omeprazole  daily   Headache    Heart murmur    Hiatal hernia    History of gastric ulcer    History of migraine    last one about 2wks ago-takes Imitrex  prn   History of seizures    Joint pain    Joint swelling    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   Meningioma (HCC)    Migraine without aura and without status migrainosus, not intractable 01/13/2015   Mixed dyslipidemia 07/25/2020   Myringotomy tube status 02/27/2021   Nausea 02/16/2021   Nocturnal leg cramps 02/20/2015   Obesity (BMI 30-39.9) 07/22/2014   Osteoarthritis of left knee 12/02/2012   Overactive bladder 01/02/2021   Peptic ulcer disease    Physical exam 07/22/2014   Recurrent acute serous otitis media of left ear 12/05/2020  Seizure-like activity (HCC) 05/12/2014    Past Surgical History:  Procedure Laterality Date   BALLOON DILATION  04/03/2012   Procedure: BALLOON DILATION;  Surgeon: Belvie JONETTA Just, MD;  Location: [facility];  Service: Endoscopy;  Laterality: N/A;   BIOPSY  08/24/2019   Procedure: BIOPSY;  Surgeon: San Sandor GAILS, DO;  Location: [facility];  Service: Gastroenterology;;  EGD and COLON   BRAVO PH STUDY N/A 08/24/2019   Procedure: BRAVO PH STUDY;  Surgeon: San Sandor GAILS, DO;  Location: [facility];  Service: Gastroenterology;  Laterality: N/A;   CHOLECYSTECTOMY N/A 04/24/2018   Procedure: LAPAROSCOPIC CHOLECYSTECTOMY;  Surgeon: Signe Mitzie LABOR, MD;   Location: [facility];  Service: General;  Laterality: N/A;   COLONOSCOPY     COLONOSCOPY WITH PROPOFOL  N/A 08/24/2019   Procedure: COLONOSCOPY WITH PROPOFOL ;  Surgeon: San Sandor GAILS, DO;  Location: [facility];  Service: Gastroenterology;  Laterality: N/A;   DIAGNOSTIC LAPAROSCOPY  70yrs ago   ESOPHAGEAL MANOMETRY N/A 09/04/2015   Procedure: ESOPHAGEAL MANOMETRY (EM);  Surgeon: Belvie Just, MD;  Location: [facility];  Service: Endoscopy;  Laterality: N/A;   ESOPHAGEAL MANOMETRY N/A 12/25/2022   Procedure: ESOPHAGEAL MANOMETRY (EM);  Surgeon: San Sandor GAILS, DO;  Location: [facility];  Service: Gastroenterology;  Laterality: N/A;   ESOPHAGOGASTRODUODENOSCOPY  04/03/2012   Procedure: ESOPHAGOGASTRODUODENOSCOPY (EGD);  Surgeon: Belvie JONETTA Just, MD;  Location: [facility];  Service: Endoscopy;  Laterality: N/A;  EGD w/ balloon dilation   ESOPHAGOGASTRODUODENOSCOPY (EGD) WITH PROPOFOL  N/A 02/18/2014   Procedure: ESOPHAGOGASTRODUODENOSCOPY (EGD) WITH PROPOFOL ;  Surgeon: Belvie JONETTA Just, MD;  Location: [facility];  Service: Endoscopy;  Laterality: N/A;   ESOPHAGOGASTRODUODENOSCOPY (EGD) WITH PROPOFOL  N/A 04/22/2014   Procedure: ESOPHAGOGASTRODUODENOSCOPY (EGD) WITH PROPOFOL ;  Surgeon: Belvie JONETTA Just, MD;  Location: [facility];  Service: Endoscopy;  Laterality: N/A;   ESOPHAGOGASTRODUODENOSCOPY (EGD) WITH PROPOFOL  N/A 08/24/2019   Procedure: ESOPHAGOGASTRODUODENOSCOPY (EGD) WITH PROPOFOL ;  Surgeon: San Sandor GAILS, DO;  Location: [facility];  Service: Gastroenterology;  Laterality: N/A;   EYE SURGERY Bilateral 2021   GASTRECTOMY N/A 06/06/2023   Procedure: DISTAL GASTRECTOMY;  Surgeon: Tanda Locus, MD;  Location: [facility] OR;  Service: General;  Laterality: N/A;   GASTROSTOMY N/A 06/06/2023   Procedure: PLACEMENT OF GASTRIC AND JEJUNAL FEEDING TUBES;  Surgeon: Tanda Locus, MD;  Location: [facility];  Service: General;  Laterality: N/A;   IR REPLC GASTRO/COLONIC TUBE PERCUT W/FLUORO  07/07/2023    LAPAROTOMY N/A 06/06/2023   Procedure: EXPLORATION LAPAROTOMY;  Surgeon: Tanda Locus, MD;  Location: [facility] OR;  Service: General;  Laterality: N/A;   LAPAROTOMY N/A 06/18/2023   Procedure: EXPLORATION LAPAROTOMY, INSERTION OF JEJUNAL FEEDING TUBE;  Surgeon: Teresa Lonni HERO, MD;  Location: [facility];  Service: General;  Laterality: N/A;   LEFT HEART CATH AND CORONARY ANGIOGRAPHY N/A 04/18/2021   Procedure: LEFT HEART CATH AND CORONARY ANGIOGRAPHY;  Surgeon: Court Dorn PARAS, MD;  Location: [facility];  Service: Cardiovascular;  Laterality: N/A;   NASAL SEPTUM SURGERY  09/2016   PH IMPEDANCE STUDY N/A 09/04/2015   Procedure: PH IMPEDANCE STUDY;  Surgeon: Belvie Just, MD;  Location: [facility];  Service: Endoscopy;  Laterality: N/A;   REPLACEMENT TOTAL KNEE Right 75yrs ago   TOTAL KNEE ARTHROPLASTY Left 11/30/2012   Procedure: LEFT TOTAL KNEE ARTHROPLASTY;  Surgeon: Dempsey PARAS Sensor, MD;  Location: [facility];  Service: Orthopedics;  Laterality: Left;    Family History  Problem Relation Age of Onset   Arthritis Mother    Cancer  Mother        breast   Heart disease Mother        died from heart problems at age 60, had CAD   Depression Mother    Heart disease Father        had CABG   Kidney disease Father    Arthritis Sister    Heart murmur Sister    Arthritis Sister    Aneurysm Sister    Arthritis Brother        serious back/neck problems   Healthy Daughter    Colon cancer Neg Hx    Esophageal cancer Neg Hx    Pancreatic cancer Neg Hx    Stomach cancer Neg Hx    Rectal cancer Neg Hx     Social History   Socioeconomic History   Marital status: Widowed    Spouse name: Not on file   Number of children: 1   Years of education: Not on file   Highest education level: 12th grade  Occupational History   Occupation: Retired  Tobacco Use   Smoking status: Never   Smokeless tobacco: Never  Vaping Use   Vaping status: Never Used  Substance and Sexual Activity   Alcohol  use: No   Drug use: No   Sexual activity: Not Currently    Birth control/protection: Post-menopausal  Other Topics Concern   Not on file  Social History Narrative   On disability- in the past she was a museum/gallery exhibitions officer   Single   1 daughter- lives in [place]   2 dogs   Enjoys reading, watching television   Pt is right handed   Drinks coffee once a week, green tea daily, soda sometimes    Social Drivers of Corporate Investment Banker Strain: Low Risk  (09/22/2023)   Overall Financial Resource Strain (CARDIA)    Difficulty of Paying Living Expenses: Not hard at all  Food Insecurity: No Food Insecurity (09/22/2023)   Hunger Vital Sign    Worried About Running Out of Food in the Last Year: Never true    Ran Out of Food in the Last Year: Never true  Transportation Needs: No Transportation Needs (09/22/2023)   PRAPARE - Administrator, Civil Service (Medical): No    Lack of Transportation (Non-Medical): No  Physical Activity: Insufficiently Active (09/22/2023)   Exercise Vital Sign    Days of Exercise per Week: 2 days    Minutes of Exercise per Session: 10 min  Stress: Stress Concern Present (09/22/2023)   [employer] of Occupational Health - Occupational Stress Questionnaire    Feeling of Stress : To some extent  Social Connections: Moderately Isolated (09/22/2023)   Social Connection and Isolation Panel [NHANES]    Frequency of Communication with Friends and Family: Once a week    Frequency of Social Gatherings with Friends and Family: Once a week    Attends Religious Services: 1 to 4 times per year    Active Member of [employer] or Organizations: Yes    Attends Banker Meetings: 1 to 4 times per year    Marital Status: Widowed  Intimate Partner Violence: Not At Risk (06/19/2023)   Humiliation, Afraid, Rape, and Kick questionnaire    Fear of Current or Ex-Partner: No    Emotionally Abused: No    Physically Abused: No    Sexually Abused: No     Outpatient Medications Prior to Visit  Medication Sig Dispense Refill   acetaminophen  (TYLENOL ) 500  MG tablet Take 2 tablets (1,000 mg total) by mouth 4 (four) times daily. 30 tablet 0   albuterol  (VENTOLIN  HFA) 108 (90 Base) MCG/ACT inhaler Inhale 2 puffs into the lungs every 6 (six) hours as needed for wheezing or shortness of breath. 8 g 1   alendronate  (FOSAMAX ) 70 MG tablet Take 1 tablet (70 mg total) by mouth every 7 (seven) days. Take with a full glass of water  on an empty stomach. 4 tablet 11   ALPRAZolam  (XANAX ) 0.5 MG tablet Take 1 tablet (0.5 mg total) by mouth 2 (two) times daily as needed for anxiety. 45 tablet 0   aspirin  EC 81 MG tablet Take 1 tablet (81 mg total) by mouth daily. Swallow whole. 90 tablet 3   busPIRone  (BUSPAR ) 15 MG tablet Take 1 tablet (15 mg total) by mouth 2 (two) times daily. 180 tablet 1   eletriptan  (RELPAX ) 40 MG tablet Take 1 tablet (40 mg total) by mouth as needed for migraine or headache. May repeat in 2 hours if headache persists or recurs. 10 tablet 5   fluticasone  (FLONASE ) 50 MCG/ACT nasal spray SHAKE LIQUID AND USE 2 SPRAYS IN EACH NOSTRIL DAILY 48 g 3   lidocaine  (LIDODERM ) 5 % Place 1 patch onto the skin daily. Remove & Discard patch within 12 hours or as directed by MD 10 patch 0   liver oil-zinc  oxide (DESITIN) 40 % ointment Apply topically 2 (two) times daily.     methocarbamol  (ROBAXIN ) 500 MG tablet Take 2 tablets (1,000 mg total) by mouth every 6 (six) hours as needed for muscle spasms. 80 tablet 0   mirtazapine  (REMERON ) 15 MG tablet Take 1 tablet (15 mg total) by mouth at bedtime. 30 tablet 1   Multiple Vitamins-Iron  (MULTIVITAMINS WITH IRON ) TABS tablet Take 1 tablet by mouth daily.     MYRBETRIQ  25 MG TB24 tablet TAKE 1 TABLET(25 MG) BY MOUTH DAILY 90 tablet 1   nitroGLYCERIN  (NITROSTAT ) 0.4 MG SL tablet Place 0.4 mg under the tongue every 5 (five) minutes as needed for chest pain.     ondansetron  (ZOFRAN -ODT) 4 MG disintegrating  tablet Take 1 tablet (4 mg total) by mouth every 6 (six) hours as needed for nausea. 20 tablet 0   pantoprazole  (PROTONIX ) 40 MG tablet Take 1 tablet (40 mg total) by mouth at bedtime. 30 tablet 0   Prucalopride Succinate  (MOTEGRITY ) 2 MG TABS Take 1 tablet (2 mg total) by mouth daily. 30 tablet 3   RESTASIS  0.05 % ophthalmic emulsion Place 1 drop into both eyes 2 (two) times daily.     levETIRAcetam  (KEPPRA ) 1000 MG tablet Take 1 tablet (1,000 mg total) by mouth 2 (two) times daily. Take 1 tablet twice daily for one week, then increase to 2 tablets twice daily.     metoCLOPramide  (REGLAN ) 10 MG tablet Take 1 tablet (10 mg total) by mouth 4 (four) times daily -  before meals and at bedtime for 14 days. 56 tablet 0   atorvastatin  (LIPITOR) 20 MG tablet TAKE 1 TABLET(20 MG) BY MOUTH DAILY 90 tablet 1   vitamin D3 (CHOLECALCIFEROL ) 25 MCG tablet Take 1 tablet (1,000 Units total) by mouth daily. 100 tablet 0   No facility-administered medications prior to visit.    Allergies  Allergen Reactions   Naratriptan      Ineffective    ROS See HPI     Objective:    Physical Exam Constitutional:      General: She is not in acute distress.  Appearance: Normal appearance. She is well-developed.  HENT:     Head: Normocephalic and atraumatic.     Right Ear: External ear normal.     Left Ear: External ear normal.  Eyes:     General: No scleral icterus. Neck:     Thyroid : No thyromegaly.  Cardiovascular:     Rate and Rhythm: Normal rate and regular rhythm.     Heart sounds: Normal heart sounds. No murmur heard. Pulmonary:     Effort: Pulmonary effort is normal. No respiratory distress.     Breath sounds: Normal breath sounds. No wheezing.  Musculoskeletal:     Cervical back: Neck supple.  Skin:    General: Skin is warm and dry.  Neurological:     Mental Status: She is alert and oriented to person, place, and time.  Psychiatric:        Mood and Affect: Mood normal.        Behavior:  Behavior normal.        Thought Content: Thought content normal.        Judgment: Judgment normal.      BP 134/85 (BP Location: Right Arm, Patient Position: Sitting, Cuff Size: Small)   Pulse 85   Temp 97.9 F (36.6 C) (Oral)   Resp 16   Ht 5' 4 (1.626 m)   Wt 110 lb (49.9 kg)   SpO2 100%   BMI 18.88 kg/m  Wt Readings from Last 3 Encounters:  09/24/23 110 lb (49.9 kg)  08/20/23 101 lb 12.8 oz (46.2 kg)  07/16/23 104 lb (47.2 kg)       Assessment & Plan:   Problem List Items Addressed This Visit       Unprioritized   Weight loss - Primary   Wt Readings from Last 3 Encounters:  09/24/23 110 lb (49.9 kg)  08/20/23 101 lb 12.8 oz (46.2 kg)  07/16/23 104 lb (47.2 kg)   Weight is improving.  Monitor.       Depression with anxiety   Depression is stable, anxiety is improved with increase in buspar  from 7.5mg  to 15mg .  Continue buspar  15mg .       Constipation   Improved now that she is incorporating more fiber into her diet as well as miralax.       B12 deficiency   B12 injection today.        I have discontinued Lindsay DEL. Biegler's atorvastatin  and vitamin D3. I am also having her maintain her alendronate , aspirin  EC, nitroGLYCERIN , fluticasone , Restasis , albuterol , eletriptan , acetaminophen , metoCLOPramide , ondansetron , pantoprazole , methocarbamol , multivitamins with iron , lidocaine , liver oil-zinc  oxide, mirtazapine , levETIRAcetam , busPIRone , ALPRAZolam , Motegrity , and Myrbetriq .  No orders of the defined types were placed in this encounter.

## 2023-09-24 NOTE — Assessment & Plan Note (Addendum)
 Improved now that she is incorporating more fiber into her diet as well as miralax.

## 2023-09-28 ENCOUNTER — Other Ambulatory Visit: Payer: Self-pay | Admitting: Neurology

## 2023-10-08 ENCOUNTER — Other Ambulatory Visit: Payer: Self-pay | Admitting: Family

## 2023-10-31 DIAGNOSIS — Z903 Acquired absence of stomach [part of]: Secondary | ICD-10-CM | POA: Diagnosis not present

## 2023-10-31 DIAGNOSIS — L918 Other hypertrophic disorders of the skin: Secondary | ICD-10-CM | POA: Diagnosis not present

## 2023-11-06 ENCOUNTER — Other Ambulatory Visit (HOSPITAL_COMMUNITY): Payer: Self-pay

## 2023-11-09 ENCOUNTER — Encounter: Payer: Self-pay | Admitting: Neurology

## 2023-11-10 ENCOUNTER — Other Ambulatory Visit: Payer: Self-pay | Admitting: Neurology

## 2023-11-10 ENCOUNTER — Telehealth: Payer: Self-pay

## 2023-11-10 MED ORDER — ELETRIPTAN HYDROBROMIDE 40 MG PO TABS: 40.0000 mg | ORAL_TABLET | ORAL | 5 refills | Status: DC | PRN

## 2023-11-10 NOTE — Telephone Encounter (Signed)
 I sent a prescription for eletriptan to her pharmacy.  Can we let patient know and proceed with prior-auth?  Thank you

## 2023-11-10 NOTE — Telephone Encounter (Signed)
 PA needed for Eletriptan

## 2023-11-18 ENCOUNTER — Ambulatory Visit (INDEPENDENT_AMBULATORY_CARE_PROVIDER_SITE_OTHER): Payer: 59

## 2023-11-18 VITALS — Ht 64.0 in | Wt 110.0 lb

## 2023-11-18 DIAGNOSIS — Z0000 Encounter for general adult medical examination without abnormal findings: Secondary | ICD-10-CM

## 2023-11-18 NOTE — Progress Notes (Signed)
 Subjective:   Lindsay Frost is a 68 y.o. female who presents for Medicare Annual (Subsequent) preventive examination.  Visit Complete: Virtual I connected with  Lindsay Frost on 11/18/23 by a audio enabled telemedicine application and verified that I am speaking with the correct person using two identifiers.  Patient Location: Home  Provider Location: Home Office  I discussed the limitations of evaluation and management by telemedicine. The patient expressed understanding and agreed to proceed.  Vital Signs: Because this visit was a virtual/telehealth visit, some criteria may be missing or patient reported. Any vitals not documented were not able to be obtained and vitals that have been documented are patient reported.  Patient Medicare AWV questionnaire was completed by the patient on 11/17/23; I have confirmed that all information answered by patient is correct and no changes since this date.  Cardiac Risk Factors include: advanced age (>62men, >92 women);hypertension     Objective:    Today's Vitals   11/18/23 1401  Weight: 110 lb (49.9 kg)  Height: 5\' 4"  (1.626 m)   Body mass index is 18.88 kg/m.     11/18/2023    2:10 PM 08/18/2023   12:43 PM 06/18/2023    9:30 AM 06/06/2023    6:12 AM 05/30/2023   11:14 AM 11/14/2022    3:05 PM 11/13/2021    1:08 PM  Advanced Directives  Does Patient Have a Medical Advance Directive? No No No No No No No  Would patient like information on creating a medical advance directive? No - Patient declined  No - Patient declined No - Patient declined No - Patient declined No - Patient declined     Current Medications (verified) Outpatient Encounter Medications as of 11/18/2023  Medication Sig   mirtazapine (REMERON) 15 MG tablet Take 1 tablet (15 mg total) by mouth at bedtime.   acetaminophen (TYLENOL) 500 MG tablet Take 2 tablets (1,000 mg total) by mouth 4 (four) times daily.   albuterol (VENTOLIN HFA) 108 (90 Base) MCG/ACT inhaler Inhale 2  puffs into the lungs every 6 (six) hours as needed for wheezing or shortness of breath.   alendronate (FOSAMAX) 70 MG tablet Take 1 tablet (70 mg total) by mouth every 7 (seven) days. Take with a full glass of water on an empty stomach.   ALPRAZolam (XANAX) 0.5 MG tablet Take 1 tablet (0.5 mg total) by mouth 2 (two) times daily as needed for anxiety.   aspirin EC 81 MG tablet Take 1 tablet (81 mg total) by mouth daily. Swallow whole.   busPIRone (BUSPAR) 15 MG tablet Take 1 tablet (15 mg total) by mouth 2 (two) times daily.   eletriptan (RELPAX) 40 MG tablet Take 1 tablet (40 mg total) by mouth as needed for migraine or headache. May repeat in 2 hours if headache persists or recurs.   fluticasone (FLONASE) 50 MCG/ACT nasal spray SHAKE LIQUID AND USE 2 SPRAYS IN EACH NOSTRIL DAILY   levETIRAcetam (KEPPRA) 1000 MG tablet Take 1 tablet (1,000 mg total) by mouth 2 (two) times daily. Take 1 tablet twice daily for one week, then increase to 2 tablets twice daily.   lidocaine (LIDODERM) 5 % Place 1 patch onto the skin daily. Remove & Discard patch within 12 hours or as directed by MD   liver oil-zinc oxide (DESITIN) 40 % ointment Apply topically 2 (two) times daily.   methocarbamol (ROBAXIN) 500 MG tablet Take 2 tablets (1,000 mg total) by mouth every 6 (six) hours as needed for  muscle spasms.   metoCLOPramide (REGLAN) 10 MG tablet Take 1 tablet (10 mg total) by mouth 4 (four) times daily -  before meals and at bedtime for 14 days.   Multiple Vitamins-Iron (MULTIVITAMINS WITH IRON) TABS tablet Take 1 tablet by mouth daily.   MYRBETRIQ 25 MG TB24 tablet TAKE 1 TABLET(25 MG) BY MOUTH DAILY   nitroGLYCERIN (NITROSTAT) 0.4 MG SL tablet Place 0.4 mg under the tongue every 5 (five) minutes as needed for chest pain.   ondansetron (ZOFRAN-ODT) 4 MG disintegrating tablet Take 1 tablet (4 mg total) by mouth every 6 (six) hours as needed for nausea.   pantoprazole (PROTONIX) 40 MG tablet Take 1 tablet (40 mg total) by  mouth at bedtime.   Prucalopride Succinate (MOTEGRITY) 2 MG TABS Take 1 tablet (2 mg total) by mouth daily.   RESTASIS 0.05 % ophthalmic emulsion Place 1 drop into both eyes 2 (two) times daily.   No facility-administered encounter medications on file as of 11/18/2023.    Allergies (verified) Naratriptan   History: Past Medical History:  Diagnosis Date   Acute midline thoracic back pain 02/16/2021   Acute sinusitis 09/05/2014   Allergic rhinitis 07/16/2016   Allergy 2005   Anemia 2021   Arthritis    B12 deficiency 01/02/2021   Back pain    arthritis   Chronic sinusitis 12/05/2020   Depression    takes cymbalta daily   Diarrhea 02/16/2021   Duodenal stenosis    Epigastric pain 07/29/2018   Essential hypertension 05/14/2019   Gallstones    Generalized abdominal pain 02/16/2021   GERD (gastroesophageal reflux disease)    takes Omeprazole daily   Headache    Heart murmur    Hiatal hernia    History of gastric ulcer    History of migraine    last one about 2wks ago-takes Imitrex prn   History of seizures    Joint pain    Joint swelling    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   Meningioma (HCC)    Migraine without aura and without status migrainosus, not intractable 01/13/2015   Mixed dyslipidemia 07/25/2020   Myringotomy tube status 02/27/2021   Nausea 02/16/2021   Nocturnal leg cramps 02/20/2015   Obesity (BMI 30-39.9) 07/22/2014   Osteoarthritis of left knee 12/02/2012   Overactive bladder 01/02/2021   Peptic ulcer disease    Physical exam 07/22/2014   Recurrent acute serous otitis media of left ear 12/05/2020   Seizure-like activity (HCC) 05/12/2014   Past Surgical History:  Procedure Laterality Date   BALLOON DILATION  04/03/2012   Procedure: BALLOON DILATION;  Surgeon: Theda Belfast, MD;  Location: [facility];  Service: Endoscopy;  Laterality: N/A;   BIOPSY  08/24/2019   Procedure: BIOPSY;  Surgeon:  Shellia Cleverly, DO;  Location: [facility];  Service: Gastroenterology;;  EGD and COLON   BRAVO PH STUDY N/A 08/24/2019   Procedure: BRAVO PH STUDY;  Surgeon: Shellia Cleverly, DO;  Location: [facility];  Service: Gastroenterology;  Laterality: N/A;   CHOLECYSTECTOMY N/A 04/24/2018   Procedure: LAPAROSCOPIC CHOLECYSTECTOMY;  Surgeon: Berna Bue, MD;  Location: [facility];  Service: General;  Laterality: N/A;   COLONOSCOPY     COLONOSCOPY WITH PROPOFOL N/A 08/24/2019   Procedure: COLONOSCOPY WITH PROPOFOL;  Surgeon: Shellia Cleverly, DO;  Location: [facility];  Service: Gastroenterology;  Laterality: N/A;   DIAGNOSTIC LAPAROSCOPY  28yrs ago   ESOPHAGEAL MANOMETRY N/A 09/04/2015   Procedure: ESOPHAGEAL MANOMETRY (  EM);  Surgeon: Jeani Hawking, MD;  Location: [facility];  Service: Endoscopy;  Laterality: N/A;   ESOPHAGEAL MANOMETRY N/A 12/25/2022   Procedure: ESOPHAGEAL MANOMETRY (EM);  Surgeon: Shellia Cleverly, DO;  Location: [facility];  Service: Gastroenterology;  Laterality: N/A;   ESOPHAGOGASTRODUODENOSCOPY  04/03/2012   Procedure: ESOPHAGOGASTRODUODENOSCOPY (EGD);  Surgeon: Theda Belfast, MD;  Location: [facility];  Service: Endoscopy;  Laterality: N/A;  EGD w/ balloon dilation   ESOPHAGOGASTRODUODENOSCOPY (EGD) WITH PROPOFOL N/A 02/18/2014   Procedure: ESOPHAGOGASTRODUODENOSCOPY (EGD) WITH PROPOFOL;  Surgeon: Theda Belfast, MD;  [facility]: [facility];  Service: Endoscopy;  Laterality: N/A;   ESOPHAGOGASTRODUODENOSCOPY (EGD) WITH PROPOFOL N/A 04/22/2014   Procedure: ESOPHAGOGASTRODUODENOSCOPY (EGD) WITH PROPOFOL;  Surgeon: Theda Belfast, MD;  Location: [facility];  Service: Endoscopy;  Laterality: N/A;   ESOPHAGOGASTRODUODENOSCOPY (EGD) WITH PROPOFOL N/A 08/24/2019   Procedure: ESOPHAGOGASTRODUODENOSCOPY (EGD) WITH PROPOFOL;  Surgeon: Shellia Cleverly, DO;  Location: [facility];  Service: Gastroenterology;  Laterality: N/A;   EYE SURGERY Bilateral 2021    GASTRECTOMY N/A 06/06/2023   Procedure: DISTAL GASTRECTOMY;  Surgeon: Gaynelle Adu, MD;  [facility];  Service: General;  Laterality: N/A;   GASTROSTOMY N/A 06/06/2023   Procedure: PLACEMENT OF GASTRIC AND JEJUNAL FEEDING TUBES;  Surgeon: Gaynelle Adu, MD;  Location: [facility];  Service: General;  Laterality: N/A;   IR REPLC GASTRO/COLONIC TUBE PERCUT W/FLUORO  07/07/2023   LAPAROTOMY N/A 06/06/2023   Procedure: EXPLORATION LAPAROTOMY;  Surgeon: Gaynelle Adu, MD;  Location: [facility] OR;  Service: General;  Laterality: N/A;   LAPAROTOMY N/A 06/18/2023   Procedure: EXPLORATION LAPAROTOMY, INSERTION OF JEJUNAL FEEDING TUBE;  Surgeon: Andria Meuse, MD;  Location: [facility] OR;  Service: General;  Laterality: N/A;   LEFT HEART CATH AND CORONARY ANGIOGRAPHY N/A 04/18/2021   Procedure: LEFT HEART CATH AND CORONARY ANGIOGRAPHY;  Surgeon: Runell Gess, MD;  Location: [facility];  Service: Cardiovascular;  Laterality: N/A;   NASAL SEPTUM SURGERY  09/2016   PH IMPEDANCE STUDY N/A 09/04/2015   Procedure: PH IMPEDANCE STUDY;  Surgeon: Jeani Hawking, MD;  Location: [facility];  Service: Endoscopy;  Laterality: N/A;   REPLACEMENT TOTAL KNEE Right 7yrs ago   TOTAL KNEE ARTHROPLASTY Left 11/30/2012   Procedure: LEFT TOTAL KNEE ARTHROPLASTY;  Surgeon: Nestor Lewandowsky, MD;  Location: [facility] OR;  Service: Orthopedics;  Laterality: Left;   Family History  Problem Relation Age of Onset   Arthritis Mother    Cancer Mother        breast   Heart disease Mother        died from "heart problems" at age 47, had CAD   Depression Mother    Heart disease Father        had CABG   Kidney disease Father    Arthritis Sister    Heart murmur Sister    Arthritis Sister    Aneurysm Sister    Arthritis Brother        "serious back/neck problems"   Healthy Daughter    Colon cancer Neg Hx    Esophageal cancer Neg Hx    Pancreatic cancer Neg Hx    Stomach cancer Neg Hx    Rectal cancer Neg Hx    Social History    Socioeconomic History   Marital status: Widowed    Spouse name: Not on file   Number of children: 1   Years of education: Not on file   Highest education level: 12th grade  Occupational History  Occupation: Retired  Tobacco Use   Smoking status: Never   Smokeless tobacco: Never  Vaping Use   Vaping status: Never Used  Substance and Sexual Activity   Alcohol use: No   Drug use: No   Sexual activity: Not Currently    Birth control/protection: Post-menopausal  Other Topics Concern   Not on file  Social History Narrative   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   Single   1 daughter- lives in [place]   2 dogs   Enjoys reading, watching television   Pt is right handed   Drinks coffee once a week, green tea daily, soda sometimes    Social Drivers of Corporate investment banker Strain: Low Risk  (11/18/2023)   Overall Financial Resource Strain (CARDIA)    Difficulty of Paying Living Expenses: Not hard at all  Food Insecurity: No Food Insecurity (11/18/2023)   Hunger Vital Sign    Worried About Running Out of Food in the Last Year: Never true    Ran Out of Food in the Last Year: Never true  Transportation Needs: No Transportation Needs (11/18/2023)   PRAPARE - Administrator, Civil Service (Medical): No    Lack of Transportation (Non-Medical): No  Physical Activity: Insufficiently Active (11/18/2023)   Exercise Vital Sign    Days of Exercise per Week: 2 days    Minutes of Exercise per Session: 20 min  Stress: No Stress Concern Present (11/18/2023)   [employer] of Occupational Health - Occupational Stress Questionnaire    Feeling of Stress : Not at all  Recent Concern: Stress - Stress Concern Present (09/22/2023)   [employer] of Occupational Health - Occupational Stress Questionnaire    Feeling of Stress : To some extent  Social Connections: Socially Isolated (11/18/2023)   Social Connection and Isolation Panel [NHANES]     Frequency of Communication with Friends and Family: More than three times a week    Frequency of Social Gatherings with Friends and Family: More than three times a week    Attends Religious Services: Never    Database administrator or Organizations: No    Attends Banker Meetings: Never    Marital Status: Widowed    Tobacco Counseling Counseling given: Not Answered   Clinical Intake:  Pre-visit preparation completed: Yes  Pain : No/denies pain     BMI - recorded: 18.88 Nutritional Status: BMI <19  Underweight Nutritional Risks: None Diabetes: No  How often do you need to have someone help you when you read instructions, pamphlets, or other written materials from your doctor or pharmacy?: 1 - Never  Interpreter Needed?: No  Information entered by :: Theresa Mulligan LPN   Activities of Daily Living    11/18/2023    2:09 PM 11/17/2023   10:05 PM  In your present state of health, do you have any difficulty performing the following activities:  Hearing? 1 1  Comment Wears Hearing Aids   Vision? 0 0  Difficulty concentrating or making decisions? 0 0  Walking or climbing stairs? 0 0  Dressing or bathing? 0 0  Doing errands, shopping? 0 0  Preparing Food and eating ? N N  Using the Toilet? N N  In the past six months, have you accidently leaked urine? N N  Do you have problems with loss of bowel control? N N  Managing your Medications? N N  Managing your Finances? N N  Housekeeping or  managing your Housekeeping? N N    Patient Care Team: Sandford Craze, NP as PCP - General (Internal Medicine) Gean Birchwood, MD as Consulting Physician (Orthopedic Surgery) Jeani Hawking, MD as Consulting Physician (Gastroenterology) Drema Dallas, DO as Consulting Physician (Neurology)  Indicate any recent Medical Services you may have received from other than [facility] providers in the past year (date may be approximate).     Assessment:   This is a routine wellness  examination for Delmi.  Hearing/Vision screen Hearing Screening - Comments:: Wears Hearing Aids Vision Screening - Comments::  - up to date with routine eye exams with  Dr Dione Booze   Goals Addressed               This Visit's Progress     Increase physical activity (pt-stated)         Depression Screen    11/18/2023    2:08 PM 07/16/2023    2:55 PM 02/18/2023    1:37 PM 11/14/2022    3:09 PM 11/09/2021   12:01 PM 01/30/2021    2:24 PM 07/04/2020    3:50 PM  PHQ 2/9 Scores  PHQ - 2 Score 0 6 0 2 0 0 2  PHQ- 9 Score  23     11    Fall Risk    11/18/2023    2:09 PM 11/17/2023   10:05 PM 08/18/2023   12:43 PM 02/18/2023    1:37 PM 11/13/2022    1:58 PM  Fall Risk   Falls in the past year? 0 0 0 0 0  Number falls in past yr: 0 0 0 0 0  Injury with Fall? 0 0 0 0 0  Risk for fall due to : No Fall Risks   No Fall Risks No Fall Risks  Follow up Falls prevention discussed;Falls evaluation completed  Falls evaluation completed Falls evaluation completed Falls evaluation completed    MEDICARE RISK AT HOME: Medicare Risk at Home Any stairs in or around the home?: No If so, are there any without handrails?: No Home free of loose throw rugs in walkways, pet beds, electrical cords, etc?: Yes Adequate lighting in your home to reduce risk of falls?: Yes Life alert?: No Use of a cane, walker or w/c?: No Grab bars in the bathroom?: Yes Shower chair or bench in shower?: Yes Elevated toilet seat or a handicapped toilet?: No  TIMED UP AND GO:  Was the test performed?  No    Cognitive Function:    01/15/2016    2:00 PM  MMSE - Mini Mental State Exam  Orientation to time 5  Orientation to Place 5  Registration 3  Attention/ Calculation 5  Recall 3  Language- name 2 objects 2  Language- repeat 1  Language- follow 3 step command 3  Language- read & follow direction 1  Write a sentence 1  Copy design 0  Total score 29        11/18/2023    2:10 PM 11/14/2022    3:16 PM  6CIT Screen   What Year? 0 points 0 points  What month? 0 points 0 points  What time? 0 points 0 points  Count back from 20 0 points 0 points  Months in reverse 0 points 0 points  Repeat phrase 0 points 0 points  Total Score 0 points 0 points    Immunizations Immunization History  Administered Date(s) Administered   Fluad Quad(high Dose 65+) 11/09/2021   Influenza,inj,Quad PF,6+ Mos 06/17/2016, 05/17/2017, 06/26/2018,  07/04/2020   PNEUMOCOCCAL CONJUGATE-20 02/18/2023   Tdap 08/05/2013    TDAP status: Due, Education has been provided regarding the importance of this vaccine. Advised may receive this vaccine at local pharmacy or Health Dept. Aware to provide a copy of the vaccination record if obtained from local pharmacy or Health Dept. Verbalized acceptance and understanding.  Flu Vaccine status: Declined, Education has been provided regarding the importance of this vaccine but patient still declined. Advised may receive this vaccine at local pharmacy or Health Dept. Aware to provide a copy of the vaccination record if obtained from local pharmacy or Health Dept. Verbalized acceptance and understanding.  Pneumococcal vaccine status: Up to date  Covid-19 vaccine status: Declined, Education has been provided regarding the importance of this vaccine but patient still declined. Advised may receive this vaccine at local pharmacy or Health Dept.or vaccine clinic. Aware to provide a copy of the vaccination record if obtained from local pharmacy or Health Dept. Verbalized acceptance and understanding.  Qualifies for Shingles Vaccine? Yes   Zostavax completed No   Shingrix Completed?: No.    Education has been provided regarding the importance of this vaccine. Patient has been advised to call insurance company to determine out of pocket expense if they have not yet received this vaccine. Advised may also receive vaccine at local pharmacy or Health Dept. Verbalized acceptance and understanding.  Screening  Tests Health Maintenance  Topic Date Due   Zoster Vaccines- Shingrix (1 of 2) Never done   COVID-19 Vaccine (1 - 2024-25 season) Never done   DTaP/Tdap/Td (2 - Td or Tdap) 08/06/2023   INFLUENZA VACCINE  12/15/2023 (Originally 04/17/2023)   MAMMOGRAM  02/11/2024   Medicare Annual Wellness (AWV)  11/17/2024   Colonoscopy  01/26/2030   Pneumonia Vaccine 36+ Years old  Completed   DEXA SCAN  Completed   Hepatitis C Screening  Completed   HPV VACCINES  Aged Out    Health Maintenance  Health Maintenance Due  Topic Date Due   Zoster Vaccines- Shingrix (1 of 2) Never done   COVID-19 Vaccine (1 - 2024-25 season) Never done   DTaP/Tdap/Td (2 - Td or Tdap) 08/06/2023    Colorectal cancer screening: Type of screening: Colonoscopy. Completed 01/27/23. Repeat every 7 years  Mammogram status: Completed 02/11/23. Repeat every year  Bone Density status: Completed 07/11/20. Results reflect: Bone density results: OSTEOPENIA. Repeat every   years.    Additional Screening:  Hepatitis C Screening: does qualify; Completed 06/17/16  Vision Screening: Recommended annual ophthalmology exams for early detection of glaucoma and other disorders of the eye. Is the patient up to date with their annual eye exam?  Yes  Who is the provider or what is the name of the office in which the patient attends annual eye exams? Dr Dione Booze If pt is not established with a provider, would they like to be referred to a provider to establish care? No .   Dental Screening: Recommended annual dental exams for proper oral hygiene    Community Resource Referral / Chronic Care Management:  CRR required this visit?  No   CCM required this visit?  No     Plan:     I have personally reviewed and noted the following in the patient's chart:   Medical and social history Use of alcohol, tobacco or illicit drugs  Current medications and supplements including opioid prescriptions. Patient is not currently taking opioid  prescriptions. Functional ability and status Nutritional status Physical activity Advanced directives List of  other physicians Hospitalizations, surgeries, and ER visits in previous 12 months Vitals Screenings to include cognitive, depression, and falls Referrals and appointments  In addition, I have reviewed and discussed with patient certain preventive protocols, quality metrics, and best practice recommendations. A written personalized care plan for preventive services as well as general preventive health recommendations were provided to patient.     Tillie Rung, LPN   09/21/1094   After Visit Summary: (MyChart) Due to this being a telephonic visit, the after visit summary with patients personalized plan was offered to patient via MyChart   Nurse Notes: None

## 2023-11-18 NOTE — Patient Instructions (Addendum)
 Lindsay Frost , Thank you for taking time to come for your Medicare Wellness Visit. I appreciate your ongoing commitment to your health goals. Please review the following plan we discussed and let me know if I can assist you in the future.   Referrals/Orders/Follow-Ups/Clinician Recommendations:   This is a list of the screening recommended for you and due dates:  Health Maintenance  Topic Date Due   Zoster (Shingles) Vaccine (1 of 2) Never done   COVID-19 Vaccine (1 - 2024-25 season) Never done   DTaP/Tdap/Td vaccine (2 - Td or Tdap) 08/06/2023   Flu Shot  12/15/2023*   Mammogram  02/11/2024   Medicare Annual Wellness Visit  11/17/2024   Colon Cancer Screening  01/26/2030   Pneumonia Vaccine  Completed   DEXA scan (bone density measurement)  Completed   Hepatitis C Screening  Completed   HPV Vaccine  Aged Out  *Topic was postponed. The date shown is not the original due date.    Advanced directives: (Declined) Advance directive discussed with you today. Even though you declined this today, please call our office should you change your mind, and we can give you the proper paperwork for you to fill out.  Next Medicare Annual Wellness Visit scheduled for next year: Yes

## 2023-11-19 ENCOUNTER — Other Ambulatory Visit (HOSPITAL_COMMUNITY): Payer: Self-pay

## 2023-11-19 ENCOUNTER — Telehealth: Payer: Self-pay

## 2023-11-19 NOTE — Telephone Encounter (Signed)
*  [facility]  Pharmacy Patient Advocate Encounter   Received notification from Pt Calls Messages that prior authorization for Eletriptan is required/requested.   Insurance verification completed.   The patient is insured through [employer] .   Per test claim: Refill too soon. PA is not needed at this time. Medication was filled 11/10/2023. Next eligible fill date is 12/03/2023.

## 2023-11-19 NOTE — Telephone Encounter (Signed)
 PA request has been Cancelled. New Encounter has been or will be created for follow up. For additional info see Pharmacy Prior Auth telephone encounter from 03/05.  Refill too soon

## 2023-11-24 ENCOUNTER — Other Ambulatory Visit: Payer: Self-pay | Admitting: Neurology

## 2023-11-26 ENCOUNTER — Other Ambulatory Visit: Payer: Self-pay | Admitting: Family

## 2023-12-02 ENCOUNTER — Telehealth: Admitting: Physician Assistant

## 2023-12-02 DIAGNOSIS — J208 Acute bronchitis due to other specified organisms: Secondary | ICD-10-CM | POA: Diagnosis not present

## 2023-12-02 DIAGNOSIS — B9689 Other specified bacterial agents as the cause of diseases classified elsewhere: Secondary | ICD-10-CM

## 2023-12-02 MED ORDER — BENZONATATE 100 MG PO CAPS: 100.0000 mg | ORAL_CAPSULE | Freq: Three times a day (TID) | ORAL | 0 refills | Status: DC | PRN

## 2023-12-02 MED ORDER — AZITHROMYCIN 250 MG PO TABS: ORAL_TABLET | ORAL | 0 refills | Status: AC

## 2023-12-02 NOTE — Progress Notes (Signed)
 E-Visit for Cough  We are sorry that you are not feeling well.  Here is how we plan to help!  Based on your presentation I believe you most likely have A cough due to bacteria.  When patients have a fever and a productive cough with a change in color or increased sputum production, we are concerned about bacterial bronchitis.  If left untreated it can progress to pneumonia.  If your symptoms do not improve with your treatment plan it is important that you contact your provider.   I have prescribed Azithromyin 250 mg: two tablets now and then one tablet daily for 4 additonal days    In addition you may use A non-prescription cough medication called Mucinex DM: take 2 tablets every 12 hours. and A prescription cough medication called Tessalon Perles 100mg . You may take 1-2 capsules every 8 hours as needed for your cough.   From your responses in the eVisit questionnaire you describe inflammation in the upper respiratory tract which is causing a significant cough.  This is commonly called Bronchitis and has four common causes:   Allergies Viral Infections Acid Reflux Bacterial Infection Allergies, viruses and acid reflux are treated by controlling symptoms or eliminating the cause. An example might be a cough caused by taking certain blood pressure medications. You stop the cough by changing the medication. Another example might be a cough caused by acid reflux. Controlling the reflux helps control the cough.  USE OF BRONCHODILATOR ("RESCUE") INHALERS: There is a risk from using your bronchodilator too frequently.  The risk is that over-reliance on a medication which only relaxes the muscles surrounding the breathing tubes can reduce the effectiveness of medications prescribed to reduce swelling and congestion of the tubes themselves.  Although you feel brief relief from the bronchodilator inhaler, your asthma may actually be worsening with the tubes becoming more swollen and filled with mucus.  This  can delay other crucial treatments, such as oral steroid medications. If you need to use a bronchodilator inhaler daily, several times per day, you should discuss this with your provider.  There are probably better treatments that could be used to keep your asthma under control.     HOME CARE Only take medications as instructed by your medical team. Complete the entire course of an antibiotic. Drink plenty of fluids and get plenty of rest. Avoid close contacts especially the very young and the elderly Cover your mouth if you cough or cough into your sleeve. Always remember to wash your hands A steam or ultrasonic humidifier can help congestion.   GET HELP RIGHT AWAY IF: You develop worsening fever. You become short of breath You cough up blood. Your symptoms persist after you have completed your treatment plan MAKE SURE YOU  Understand these instructions. Will watch your condition. Will get help right away if you are not doing well or get worse.    Thank you for choosing an e-visit.  Your e-visit answers were reviewed by a board certified advanced clinical practitioner to complete your personal care plan. Depending upon the condition, your plan could have included both over the counter or prescription medications.  Please review your pharmacy choice. Make sure the pharmacy is open so you can pick up prescription now. If there is a problem, you may contact your provider through [employer] and have the prescription routed to another pharmacy.  Your safety is important to [place]. If you have drug allergies check your prescription carefully.   For the next 24  hours you can use MyChart to ask questions about today's visit, request a non-urgent call back, or ask for a work or school excuse. You will get an email in the next two days asking about your experience. I hope that your e-visit has been valuable and will speed your recovery.  I have spent 5 minutes in review of e-visit  questionnaire, review and updating patient chart, medical decision making and response to patient.   Margaretann Loveless, PA-C

## 2023-12-10 DIAGNOSIS — Z903 Acquired absence of stomach [part of]: Secondary | ICD-10-CM | POA: Diagnosis not present

## 2023-12-10 DIAGNOSIS — L918 Other hypertrophic disorders of the skin: Secondary | ICD-10-CM | POA: Diagnosis not present

## 2023-12-14 ENCOUNTER — Other Ambulatory Visit: Payer: Self-pay | Admitting: Family

## 2023-12-14 DIAGNOSIS — N3281 Overactive bladder: Secondary | ICD-10-CM

## 2023-12-23 ENCOUNTER — Telehealth (INDEPENDENT_AMBULATORY_CARE_PROVIDER_SITE_OTHER): Admitting: Family

## 2023-12-23 ENCOUNTER — Ambulatory Visit: Payer: 59 | Admitting: Family

## 2023-12-23 ENCOUNTER — Telehealth: Payer: Self-pay | Admitting: Family

## 2023-12-23 VITALS — Wt 123.0 lb

## 2023-12-23 DIAGNOSIS — K5900 Constipation, unspecified: Secondary | ICD-10-CM

## 2023-12-23 DIAGNOSIS — F418 Other specified anxiety disorders: Secondary | ICD-10-CM | POA: Diagnosis not present

## 2023-12-23 DIAGNOSIS — E538 Deficiency of other specified B group vitamins: Secondary | ICD-10-CM | POA: Diagnosis not present

## 2023-12-23 DIAGNOSIS — R634 Abnormal weight loss: Secondary | ICD-10-CM | POA: Diagnosis not present

## 2023-12-23 MED ORDER — MIRTAZAPINE 15 MG PO TABS: 7.5000 mg | ORAL_TABLET | Freq: Every day | ORAL | Status: DC

## 2023-12-23 NOTE — Assessment & Plan Note (Signed)
 Wt Readings from Last 3 Encounters:  12/23/23 123 lb (55.8 kg)  11/18/23 110 lb (49.9 kg)  09/24/23 110 lb (49.9 kg)   Weight has improved.  Monitor on decreased dose of remeron.

## 2023-12-23 NOTE — Assessment & Plan Note (Signed)
  Anxiety well-managed with Buspar 15 mg twice daily. - Continue Buspar 15 mg twice daily.  Depression stable, weight has come up nicely with remeron.  Will cut in half and decrease from 15mg  to 7.5mg  once daily.  She will let me know if she has any changes in mood or if her weight gain does not begin to stabilize.

## 2023-12-23 NOTE — Assessment & Plan Note (Signed)
  Managed with increased fiber and Miralax. - Continue fiber intake and Miralax as needed.

## 2023-12-23 NOTE — Assessment & Plan Note (Signed)
 Due for b12 injection- recommend pt schedule nurse visit- see mychart message.

## 2023-12-23 NOTE — Progress Notes (Signed)
 MyChart Video Visit    Virtual Visit via Video Note    Patient location: Home. Patient and provider in visit Provider location: Office  I discussed the limitations of evaluation and management by telemedicine and the availability of in person appointments. The patient expressed understanding and agreed to proceed.  Visit Date: 12/23/2023  Today's healthcare provider: Lemont Fillers, NP     Subjective:    Patient ID: Lindsay Frost, female    DOB: 04-19-1956, 68 y.o.   MRN: [id number]  Chief Complaint  Patient presents with   Depression    Follow up   Anxiety    Follow up    HPI  The patient presents for follow-up of anxiety, depression, and weight management.  She has been experiencing anxiety, which is being managed with an increased dose of Buspar to 15 mg twice a day. This adjustment has been beneficial in managing her symptoms.  Regarding her depression, she experiences some bad days but overall feels she is doing well. She is currently taking mirtazapine, which she notes has been a good appetite stimulator.  Her weight has increased to 123 pounds as of a couple of weeks ago, which she attributes to the appetite-stimulating effects of her antidepressant. She humorously mentions eating 'Twinkies in the middle of the night.'  She is managing constipation with increased fiber intake and Miralax, which she continues to use.  Past Medical History:  Diagnosis Date   Acute midline thoracic back pain 02/16/2021   Acute sinusitis 09/05/2014   Allergic rhinitis 07/16/2016   Allergy 2005   Anemia 2021   Arthritis    B12 deficiency 01/02/2021   Back pain    arthritis   Chronic sinusitis 12/05/2020   Depression    takes cymbalta daily   Diarrhea 02/16/2021   Duodenal stenosis    Epigastric pain 07/29/2018   Essential hypertension 05/14/2019   Gallstones    Generalized abdominal pain 02/16/2021   GERD (gastroesophageal reflux disease)    takes  Omeprazole daily   Headache    Heart murmur    Hiatal hernia    History of gastric ulcer    History of migraine    last one about 2wks ago-takes Imitrex prn   History of seizures    Joint pain    Joint swelling    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   Meningioma (HCC)    Migraine without aura and without status migrainosus, not intractable 01/13/2015   Mixed dyslipidemia 07/25/2020   Myringotomy tube status 02/27/2021   Nausea 02/16/2021   Nocturnal leg cramps 02/20/2015   Obesity (BMI 30-39.9) 07/22/2014   Osteoarthritis of left knee 12/02/2012   Overactive bladder 01/02/2021   Peptic ulcer disease    Physical exam 07/22/2014   Recurrent acute serous otitis media of left ear 12/05/2020   Seizure-like activity (HCC) 05/12/2014    Past Surgical History:  Procedure Laterality Date   BALLOON DILATION  04/03/2012   Procedure: BALLOON DILATION;  Surgeon: Theda Belfast, MD;  Location: [facility];  Service: Endoscopy;  Laterality: N/A;   BIOPSY  08/24/2019   Procedure: BIOPSY;  Surgeon: Shellia Cleverly, DO;  Location: [facility];  Service: Gastroenterology;;  EGD and COLON   BRAVO PH STUDY N/A 08/24/2019   Procedure: BRAVO PH STUDY;  Surgeon: Shellia Cleverly, DO;  Location: [facility];  Service: Gastroenterology;  Laterality: N/A;   CHOLECYSTECTOMY N/A 04/24/2018   Procedure: LAPAROSCOPIC CHOLECYSTECTOMY;  Surgeon: Fredricka Bonine,  Lady Deutscher, MD;  Location: [facility];  Service: General;  Laterality: N/A;   COLONOSCOPY     COLONOSCOPY WITH PROPOFOL N/A 08/24/2019   Procedure: COLONOSCOPY WITH PROPOFOL;  Surgeon: Shellia Cleverly, DO;  Location: [facility];  Service: Gastroenterology;  Laterality: N/A;   DIAGNOSTIC LAPAROSCOPY  96yrs ago   ESOPHAGEAL MANOMETRY N/A 09/04/2015   Procedure: ESOPHAGEAL MANOMETRY (EM);  Surgeon: Jeani Hawking, MD;  Location: [facility];  Service: Endoscopy;  Laterality: N/A;   ESOPHAGEAL MANOMETRY N/A  12/25/2022   Procedure: ESOPHAGEAL MANOMETRY (EM);  Surgeon: Shellia Cleverly, DO;  Location: [facility];  Service: Gastroenterology;  Laterality: N/A;   ESOPHAGOGASTRODUODENOSCOPY  04/03/2012   Procedure: ESOPHAGOGASTRODUODENOSCOPY (EGD);  Surgeon: Theda Belfast, MD;  Location: [facility];  Service: Endoscopy;  Laterality: N/A;  EGD w/ balloon dilation   ESOPHAGOGASTRODUODENOSCOPY (EGD) WITH PROPOFOL N/A 02/18/2014   Procedure: ESOPHAGOGASTRODUODENOSCOPY (EGD) WITH PROPOFOL;  Surgeon: Theda Belfast, MD;  [facility]: [facility];  Service: Endoscopy;  Laterality: N/A;   ESOPHAGOGASTRODUODENOSCOPY (EGD) WITH PROPOFOL N/A 04/22/2014   Procedure: ESOPHAGOGASTRODUODENOSCOPY (EGD) WITH PROPOFOL;  Surgeon: Theda Belfast, MD;  [facility]: [facility];  Service: Endoscopy;  Laterality: N/A;   ESOPHAGOGASTRODUODENOSCOPY (EGD) WITH PROPOFOL N/A 08/24/2019   Procedure: ESOPHAGOGASTRODUODENOSCOPY (EGD) WITH PROPOFOL;  Surgeon: Shellia Cleverly, DO;  Location: [facility];  Service: Gastroenterology;  Laterality: N/A;   EYE SURGERY Bilateral 2021   GASTRECTOMY N/A 06/06/2023   Procedure: DISTAL GASTRECTOMY;  Surgeon: Gaynelle Adu, MD;  Location: [facility] OR;  Service: General;  Laterality: N/A;   GASTROSTOMY N/A 06/06/2023   Procedure: PLACEMENT OF GASTRIC AND JEJUNAL FEEDING TUBES;  Surgeon: Gaynelle Adu, MD;  Location: [facility] OR;  Service: General;  Laterality: N/A;   IR REPLC GASTRO/COLONIC TUBE PERCUT W/FLUORO  07/07/2023   LAPAROTOMY N/A 06/06/2023   Procedure: EXPLORATION LAPAROTOMY;  Surgeon: Gaynelle Adu, MD;  Location: [facility] OR;  Service: General;  Laterality: N/A;   LAPAROTOMY N/A 06/18/2023   Procedure: EXPLORATION LAPAROTOMY, INSERTION OF JEJUNAL FEEDING TUBE;  Surgeon: Andria Meuse, MD;  Location: [facility] OR;  Service: General;  Laterality: N/A;   LEFT HEART CATH AND CORONARY ANGIOGRAPHY N/A 04/18/2021   Procedure: LEFT HEART CATH AND CORONARY ANGIOGRAPHY;  Surgeon: Runell Gess, MD;  Location:  [facility];  Service: Cardiovascular;  Laterality: N/A;   NASAL SEPTUM SURGERY  09/2016   PH IMPEDANCE STUDY N/A 09/04/2015   Procedure: PH IMPEDANCE STUDY;  Surgeon: Jeani Hawking, MD;  Location: [facility];  Service: Endoscopy;  Laterality: N/A;   REPLACEMENT TOTAL KNEE Right 84yrs ago   TOTAL KNEE ARTHROPLASTY Left 11/30/2012   Procedure: LEFT TOTAL KNEE ARTHROPLASTY;  Surgeon: Nestor Lewandowsky, MD;  Location: [facility] OR;  Service: Orthopedics;  Laterality: Left;    Family History  Problem Relation Age of Onset   Arthritis Mother    Cancer Mother        breast   Heart disease Mother        died from "heart problems" at age 38, had CAD   Depression Mother    Heart disease Father        had CABG   Kidney disease Father    Arthritis Sister    Heart murmur Sister    Arthritis Sister    Aneurysm Sister    Arthritis Brother        "serious back/neck problems"   Healthy Daughter    Colon cancer Neg Hx    Esophageal cancer Neg Hx  Pancreatic cancer Neg Hx    Stomach cancer Neg Hx    Rectal cancer Neg Hx     Social History   Socioeconomic History   Marital status: Widowed    Spouse name: Not on file   Number of children: 1   Years of education: Not on file   Highest education level: 12th grade  Occupational History   Occupation: Retired  Tobacco Use   Smoking status: Never   Smokeless tobacco: Never  Vaping Use   Vaping status: Never Used  Substance and Sexual Activity   Alcohol use: No   Drug use: No   Sexual activity: Not Currently    Birth control/protection: Post-menopausal  Other Topics Concern   Not on file  Social History Narrative   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   Single   1 daughter- lives in [place]   2 dogs   Enjoys reading, watching television   Pt is right handed   Drinks coffee once a week, green tea daily, soda sometimes    Social Drivers of Corporate investment banker Strain: Low Risk  (11/18/2023)    Overall Financial Resource Strain (CARDIA)    Difficulty of Paying Living Expenses: Not hard at all  Food Insecurity: No Food Insecurity (11/18/2023)   Hunger Vital Sign    Worried About Running Out of Food in the Last Year: Never true    Ran Out of Food in the Last Year: Never true  Transportation Needs: No Transportation Needs (11/18/2023)   PRAPARE - Administrator, Civil Service (Medical): No    Lack of Transportation (Non-Medical): No  Physical Activity: Insufficiently Active (11/18/2023)   Exercise Vital Sign    Days of Exercise per Week: 2 days    Minutes of Exercise per Session: 20 min  Stress: No Stress Concern Present (11/18/2023)   [employer] of Occupational Health - Occupational Stress Questionnaire    Feeling of Stress : Not at all  Recent Concern: Stress - Stress Concern Present (09/22/2023)   [employer] of Occupational Health - Occupational Stress Questionnaire    Feeling of Stress : To some extent  Social Connections: Socially Isolated (11/18/2023)   Social Connection and Isolation Panel [NHANES]    Frequency of Communication with Friends and Family: More than three times a week    Frequency of Social Gatherings with Friends and Family: More than three times a week    Attends Religious Services: Never    Database administrator or Organizations: No    Attends Banker Meetings: Never    Marital Status: Widowed  Intimate Partner Violence: Not At Risk (11/18/2023)   Humiliation, Afraid, Rape, and Kick questionnaire    Fear of Current or Ex-Partner: No    Emotionally Abused: No    Physically Abused: No    Sexually Abused: No    Outpatient Medications Prior to Visit  Medication Sig Dispense Refill   acetaminophen (TYLENOL) 500 MG tablet Take 2 tablets (1,000 mg total) by mouth 4 (four) times daily. 30 tablet 0   albuterol (VENTOLIN HFA) 108 (90 Base) MCG/ACT inhaler Inhale 2 puffs into the lungs every 6 (six) hours as needed for wheezing or  shortness of breath. 8 g 1   alendronate (FOSAMAX) 70 MG tablet Take 1 tablet (70 mg total) by mouth every 7 (seven) days. Take with a full glass of water on an empty stomach. 4 tablet 11   ALPRAZolam (  XANAX) 0.5 MG tablet Take 1 tablet (0.5 mg total) by mouth 2 (two) times daily as needed for anxiety. 45 tablet 0   aspirin EC 81 MG tablet Take 1 tablet (81 mg total) by mouth daily. Swallow whole. 90 tablet 3   busPIRone (BUSPAR) 15 MG tablet TAKE 1 TABLET(15 MG) BY MOUTH TWICE DAILY 180 tablet 1   eletriptan (RELPAX) 40 MG tablet Take 1 tablet (40 mg total) by mouth as needed for migraine or headache. May repeat in 2 hours if headache persists or recurs. 10 tablet 5   fluticasone (FLONASE) 50 MCG/ACT nasal spray SHAKE LIQUID AND USE 2 SPRAYS IN EACH NOSTRIL DAILY 48 g 3   levETIRAcetam (KEPPRA) 500 MG tablet Take 2 tablets (1,000 mg total) by mouth 2 (two) times daily. 120 tablet 5   lidocaine (LIDODERM) 5 % Place 1 patch onto the skin daily. Remove & Discard patch within 12 hours or as directed by MD 10 patch 0   liver oil-zinc oxide (DESITIN) 40 % ointment Apply topically 2 (two) times daily.     methocarbamol (ROBAXIN) 500 MG tablet Take 2 tablets (1,000 mg total) by mouth every 6 (six) hours as needed for muscle spasms. 80 tablet 0   Multiple Vitamins-Iron (MULTIVITAMINS WITH IRON) TABS tablet Take 1 tablet by mouth daily.     MYRBETRIQ 25 MG TB24 tablet TAKE 1 TABLET(25 MG) BY MOUTH DAILY 90 tablet 1   nitroGLYCERIN (NITROSTAT) 0.4 MG SL tablet Place 0.4 mg under the tongue every 5 (five) minutes as needed for chest pain.     ondansetron (ZOFRAN-ODT) 4 MG disintegrating tablet Take 1 tablet (4 mg total) by mouth every 6 (six) hours as needed for nausea. 20 tablet 0   pantoprazole (PROTONIX) 40 MG tablet Take 1 tablet (40 mg total) by mouth at bedtime. 30 tablet 0   Prucalopride Succinate (MOTEGRITY) 2 MG TABS Take 1 tablet (2 mg total) by mouth daily. 30 tablet 3   RESTASIS 0.05 % ophthalmic  emulsion Place 1 drop into both eyes 2 (two) times daily.     benzonatate (TESSALON) 100 MG capsule Take 1-2 capsules (100-200 mg total) by mouth 3 (three) times daily as needed. 30 capsule 0   mirtazapine (REMERON) 15 MG tablet Take 1 tablet (15 mg total) by mouth at bedtime. 90 tablet 0   levETIRAcetam (KEPPRA) 1000 MG tablet Take 1 tablet (1,000 mg total) by mouth 2 (two) times daily. Take 1 tablet twice daily for one week, then increase to 2 tablets twice daily.     metoCLOPramide (REGLAN) 10 MG tablet Take 1 tablet (10 mg total) by mouth 4 (four) times daily -  before meals and at bedtime for 14 days. 56 tablet 0   No facility-administered medications prior to visit.    Allergies  Allergen Reactions   Naratriptan     Ineffective    ROS See HPI    Objective:    Physical Exam  Wt 123 lb (55.8 kg)   BMI 21.11 kg/m  Wt Readings from Last 3 Encounters:  12/23/23 123 lb (55.8 kg)  11/18/23 110 lb (49.9 kg)  09/24/23 110 lb (49.9 kg)    Gen: Awake, alert, no acute distress Resp: Breathing is even and non-labored Psych: calm/pleasant demeanor Neuro: Alert and Oriented x 3, + facial symmetry, speech is clear.     Assessment & Plan:   Problem List Items Addressed This Visit       Unprioritized   Weight loss  Wt Readings from Last 3 Encounters:  12/23/23 123 lb (55.8 kg)  11/18/23 110 lb (49.9 kg)  09/24/23 110 lb (49.9 kg)   Weight has improved.  Monitor on decreased dose of remeron.       Depression with anxiety - Primary    Anxiety well-managed with Buspar 15 mg twice daily. - Continue Buspar 15 mg twice daily.  Depression stable, weight has come up nicely with remeron.  Will cut in half and decrease from 15mg  to 7.5mg  once daily.  She will let me know if she has any changes in mood or if her weight gain does not begin to stabilize.       Relevant Medications   mirtazapine (REMERON) 15 MG tablet   Constipation    Managed with increased fiber and  Miralax. - Continue fiber intake and Miralax as needed.      B12 deficiency   Due for b12 injection- recommend pt schedule nurse visit- see mychart message.       I have discontinued Payton Doughty. Linch's benzonatate. I have also changed her mirtazapine. Additionally, I am having her maintain her alendronate, aspirin EC, nitroGLYCERIN, fluticasone, Restasis, albuterol, acetaminophen, metoCLOPramide, ondansetron, pantoprazole, methocarbamol, multivitamins with iron, lidocaine, liver oil-zinc oxide, levETIRAcetam, ALPRAZolam, Motegrity, eletriptan, levETIRAcetam, busPIRone, and Myrbetriq.  Meds ordered this encounter  Medications   mirtazapine (REMERON) 15 MG tablet    Sig: Take 0.5 tablets (7.5 mg total) by mouth at bedtime.    Supervising Provider:   Bradd Canary [phone]    I discussed the assessment and treatment plan with the patient. The patient was provided an opportunity to ask questions and all were answered. The patient agreed with the plan and demonstrated an understanding of the instructions.   The patient was advised to call back or seek an in-person evaluation if the symptoms worsen or if the condition fails to improve as anticipated.  Lemont Fillers, NP [facility] [phone] (phone) [phone] (fax)  [facility]

## 2023-12-23 NOTE — Telephone Encounter (Signed)
 See mychart.

## 2024-01-04 ENCOUNTER — Other Ambulatory Visit: Payer: Self-pay | Admitting: Family

## 2024-01-12 ENCOUNTER — Other Ambulatory Visit: Payer: Self-pay | Admitting: Family

## 2024-02-11 ENCOUNTER — Other Ambulatory Visit: Payer: Self-pay | Admitting: General Surgery

## 2024-02-11 DIAGNOSIS — L918 Other hypertrophic disorders of the skin: Secondary | ICD-10-CM | POA: Diagnosis not present

## 2024-02-11 DIAGNOSIS — Z903 Acquired absence of stomach [part of]: Secondary | ICD-10-CM | POA: Diagnosis not present

## 2024-02-12 DIAGNOSIS — H353131 Nonexudative age-related macular degeneration, bilateral, early dry stage: Secondary | ICD-10-CM | POA: Diagnosis not present

## 2024-02-12 DIAGNOSIS — H0102A Squamous blepharitis right eye, upper and lower eyelids: Secondary | ICD-10-CM | POA: Diagnosis not present

## 2024-02-12 DIAGNOSIS — H0102B Squamous blepharitis left eye, upper and lower eyelids: Secondary | ICD-10-CM | POA: Diagnosis not present

## 2024-02-12 DIAGNOSIS — Z961 Presence of intraocular lens: Secondary | ICD-10-CM | POA: Diagnosis not present

## 2024-02-13 NOTE — Progress Notes (Signed)
 NEUROLOGY FOLLOW UP OFFICE NOTE  Lindsay Frost [id number]  Assessment/Plan:   1.  Migraine without aura, without status migrainosus, not intractable 2.  Focal onset seizures with impaired consciousness   1. Seizure prophylaxis:  Keppra  1000mg  twice daily.   2.  Migraine prevention:  Keppra  1000mg  twice daily 3.  Migraine rescue:  Relpax  40mg   4.  Limit use of pain relievers to no more than 2 days out of week to prevent risk of rebound or medication-overuse headache. 5.  Keep headache diary 6.  Repeat MRI of brain with and without contrast in one year (to follow up on meningioma) 7.  Follow up one year (about 2 weeks after repeat MRI)   Subjective:  Lindsay Frost is a 68 year old female with SIBO, gastric outlet syndrome,  s/p partial gastrectomy, HTN and dyslipidemia who follows up for migraines and seizure disorder   UPDATE: Migraines: She has a migraine about every 4 to 6 weeks (rarely severe).  She will try Advil first, and will then take Relpax  as second line, so may last 3 hours.     Current NSAIDS:  Ibuprofen Current analgesics:  Tylenol  Current triptans:  Relpax  Current ergotamine:  none Current anti-emetic:  none Current muscle relaxants:  Flexeril  (for back pain) Current anti-anxiolytic:  none Current sleep aide:  none Current Antihypertensive medications:  none Current Antidepressant medications:  Lexapro  20mg  Current Anticonvulsant medications:  Keppra  1000mg  twice daily Current anti-CGRP:  none Current Vitamins/Herbal/Supplements:  none Current Antihistamines/Decongestants:  Flonase  Other therapy:  none   Caffeine:  no caffeine Alcohol:  no Smoker:  no Diet:  Some junk food.  Drinks flavored water .   Exercise:  Walks dog Depression:  no; Anxiety:  yes Other pain:  Back pain.  Has an atypical spinal hemangioma as well. Sleep hygiene:  good   Seizures: No seizures.  Last spell occurred 05/27/15.   Current preventative: Keppra  1000mg  twice daily     HISTORY: Migraine: Onset:  Early 40s Location:  Left frontal Quality:  Stabbing Initial Intensity:  10 out of 10; September 7/10 Aura:  No Prodrome:  Photophobia Associated symptoms:  Nausea, phonophobia, photophobia, sometimes vomiting. No osmophobia, visual disturbance, or autonomic symptoms.  No associated unilateral numbness or weakness. Initial Duration:  2-3 days; September 3 hours with Relpax  Initial Frequency:  Migraines occur every 2 weeks (4-6 days per month) and dull headaches occur 2 times a week (8 days per month). Total of 14 headache days per month; September 3 times a month Triggers:  Heat Relieving factors:  Laying down in a quiet, dark, cool room with ice pack. Activity:  Cannot function when she has migraine   Past abortive therapy:  sumatriptan  50mg  (ineffective, caused racing heart), Maxalt  (ineffective), Amerge, Zomig  NS, BC powder Past preventative therapy:  Cymbalta  (for depression), nortriptyline  50mg  (discontinued in order to simplify medication list), venlafaxine , topiramate  (excessive weight loss)    Family history of headache:  daughter   Seizure: Her Wellbutrin  was increased in August 2016 to help control her depression.    At around that time, she had an episode where she woke up and found herself on the bathroom floor.  She didn't remember falling.  Later in the month, she had another episode where she woke up on the floor next to her bed and noted that she was briefly kicking both legs.  She had a severe rug burn on the left side of her face.  She did not bite her tongue during either  event but she did have some urinary incontinence during the episode in the bathroom.  Her PCP subsequently decreased her dose of Wellbutrin .  She has no prior history of seizures.  She had a sleep deprived EEG performed on 06/07/14, which showed brief intermittent transient sharp waves and slowing in the left temporal region, which although not epileptiform, could be a seizure  focus.   On 05/27/15, she had a different spell.  It was unwitnessed.  When she woke up, she was short of breath and it took a few minutes to recover.  Sometimes, she will wake up short of breath, but this episode was worse.  She did not have incontinence or tongue biting.     MRI of brain with and without contrast from 08/19/13 demonstrated a 12 mm left parietal calcified meningioma without surrounding vasogenic edema, minimally increased compared to prior study from 10/2003. MRI of the brain with and without contrast was performed on 06/08/14, which demonstrated stability of the meningioma.  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   Past antiepileptic medication:  topiramate  100mg  twice daily (excessive weight loss)  PAST MEDICAL HISTORY: Past Medical History:  Diagnosis Date   Acute midline thoracic back pain 02/16/2021   Acute sinusitis 09/05/2014   Allergic rhinitis 07/16/2016   Allergy 2005   Anemia 2021   Arthritis    B12 deficiency 01/02/2021   Back pain    arthritis   Chronic sinusitis 12/05/2020   Depression    takes cymbalta  daily   Diarrhea 02/16/2021   Duodenal stenosis    Epigastric pain 07/29/2018   Essential hypertension 05/14/2019   Gallstones    Generalized abdominal pain 02/16/2021   GERD (gastroesophageal reflux disease)    takes Omeprazole  daily   Headache    Heart murmur    Hiatal hernia    History of gastric ulcer    History of migraine    last one about 2wks ago-takes Imitrex  prn   History of seizures    Joint pain    Joint swelling    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   Meningioma (HCC)    Migraine without aura and without status migrainosus, not intractable 01/13/2015   Mixed dyslipidemia 07/25/2020   Myringotomy tube status 02/27/2021   Nausea 02/16/2021   Nocturnal  leg cramps 02/20/2015   Obesity (BMI 30-39.9) 07/22/2014   Osteoarthritis of left knee 12/02/2012   Overactive bladder 01/02/2021   Peptic ulcer disease    Physical exam 07/22/2014   Recurrent acute serous otitis media of left ear 12/05/2020   Seizure-like activity (HCC) 05/12/2014    MEDICATIONS: Current Outpatient Medications on File Prior to Visit  Medication Sig Dispense Refill   acetaminophen  (TYLENOL ) 500 MG tablet Take 2 tablets (1,000 mg total) by mouth 4 (four) times daily. 30 tablet 0   albuterol  (VENTOLIN  HFA) 108 (90 Base) MCG/ACT inhaler Inhale 2 puffs into the lungs every 6 (six) hours as needed for wheezing or shortness of breath. 8 g 1   alendronate  (FOSAMAX ) 70 MG tablet Take 1 tablet (70 mg total) by mouth every 7 (seven) days. Take with a full glass of water  on an empty stomach. 4 tablet 11   ALPRAZolam  (XANAX ) 0.5 MG tablet Take 1 tablet (0.5 mg total) by mouth 2 (two) times daily as needed for anxiety. 45 tablet 0   aspirin   EC 81 MG tablet Take 1 tablet (81 mg total) by mouth daily. Swallow whole. 90 tablet 3   busPIRone  (BUSPAR ) 15 MG tablet TAKE 1 TABLET(15 MG) BY MOUTH TWICE DAILY 180 tablet 1   eletriptan  (RELPAX ) 40 MG tablet Take 1 tablet (40 mg total) by mouth as needed for migraine or headache. May repeat in 2 hours if headache persists or recurs. 10 tablet 5   fluticasone  (FLONASE ) 50 MCG/ACT nasal spray SHAKE LIQUID AND USE 2 SPRAYS IN EACH NOSTRIL DAILY 48 g 3   levETIRAcetam  (KEPPRA ) 1000 MG tablet Take 1 tablet (1,000 mg total) by mouth 2 (two) times daily. Take 1 tablet twice daily for one week, then increase to 2 tablets twice daily.     levETIRAcetam  (KEPPRA ) 500 MG tablet Take 2 tablets (1,000 mg total) by mouth 2 (two) times daily. 120 tablet 5   lidocaine  (LIDODERM ) 5 % Place 1 patch onto the skin daily. Remove & Discard patch within 12 hours or as directed by MD 10 patch 0   liver oil-zinc  oxide (DESITIN) 40 % ointment Apply topically 2 (two) times  daily.     methocarbamol  (ROBAXIN ) 500 MG tablet Take 2 tablets (1,000 mg total) by mouth every 6 (six) hours as needed for muscle spasms. 80 tablet 0   metoCLOPramide  (REGLAN ) 10 MG tablet Take 1 tablet (10 mg total) by mouth 4 (four) times daily -  before meals and at bedtime for 14 days. 56 tablet 0   mirtazapine  (REMERON ) 15 MG tablet Take 0.5 tablets (7.5 mg total) by mouth at bedtime. 45 tablet 0   Multiple Vitamins-Iron  (MULTIVITAMINS WITH IRON ) TABS tablet Take 1 tablet by mouth daily.     MYRBETRIQ  25 MG TB24 tablet TAKE 1 TABLET(25 MG) BY MOUTH DAILY 90 tablet 1   nitroGLYCERIN  (NITROSTAT ) 0.4 MG SL tablet Place 0.4 mg under the tongue every 5 (five) minutes as needed for chest pain.     ondansetron  (ZOFRAN -ODT) 4 MG disintegrating tablet Take 1 tablet (4 mg total) by mouth every 6 (six) hours as needed for nausea. 20 tablet 0   pantoprazole  (PROTONIX ) 40 MG tablet Take 1 tablet (40 mg total) by mouth at bedtime. 30 tablet 0   Prucalopride Succinate  (MOTEGRITY ) 2 MG TABS Take 1 tablet (2 mg total) by mouth daily. 30 tablet 3   RESTASIS  0.05 % ophthalmic emulsion Place 1 drop into both eyes 2 (two) times daily.     No current facility-administered medications on file prior to visit.    ALLERGIES: Allergies  Allergen Reactions   Naratriptan      Ineffective    FAMILY HISTORY: Family History  Problem Relation Age of Onset   Arthritis Mother    Cancer Mother        breast   Heart disease Mother        died from "heart problems" at age 68, had CAD   Depression Mother    Heart disease Father        had CABG   Kidney disease Father    Arthritis Sister    Heart murmur Sister    Arthritis Sister    Aneurysm Sister    Arthritis Brother        "serious back/neck problems"   Healthy Daughter    Colon cancer Neg Hx    Esophageal cancer Neg Hx    Pancreatic cancer Neg Hx    Stomach cancer Neg Hx    Rectal cancer Neg Hx  Objective:  Blood pressure 130/81, pulse 72,  height 5\' 4"  (1.626 m), weight 123 lb (55.8 kg), SpO2 98%. General: No acute distress.  Patient appears well-groomed.   Head:  Normocephalic/atraumatic Eyes:  Fundi examined but not visualized Neck: supple, no paraspinal tenderness, full range of motion Heart:  Regular rate and rhythm Lungs:  Clear to auscultation bilaterally Back: No paraspinal tenderness Neurological Exam: alert and oriented.  Speech fluent and not dysarthric, language intact.  CN II-XII intact. Bulk and tone normal, muscle strength 5/5 throughout.  Sensation to light touch intact.  Deep tendon reflexes 2+ throughout, toes downgoing.  Finger to nose testing intact.  Gait normal, Romberg negative.   Janne Members, DO  CC: Dorrene Gaucher, NP

## 2024-02-16 ENCOUNTER — Encounter: Payer: Self-pay | Admitting: Neurology

## 2024-02-16 ENCOUNTER — Ambulatory Visit (INDEPENDENT_AMBULATORY_CARE_PROVIDER_SITE_OTHER): Payer: 59 | Admitting: Neurology

## 2024-02-16 VITALS — BP 130/81 | HR 72 | Ht 64.0 in | Wt 123.0 lb

## 2024-02-16 DIAGNOSIS — G40209 Localization-related (focal) (partial) symptomatic epilepsy and epileptic syndromes with complex partial seizures, not intractable, without status epilepticus: Secondary | ICD-10-CM

## 2024-02-16 DIAGNOSIS — D320 Benign neoplasm of cerebral meninges: Secondary | ICD-10-CM

## 2024-02-16 DIAGNOSIS — G43009 Migraine without aura, not intractable, without status migrainosus: Secondary | ICD-10-CM | POA: Diagnosis not present

## 2024-02-16 NOTE — Patient Instructions (Signed)
 Keppra  1000mg  twice daily Eletriptan  as needed.  Limit use of pain relievers to no more than 9 days out of the month to prevent risk of rebound or medication-overuse headache. Repeat MRI of brain with and without contrast in one year Follow up in one year (2 weeks after repeat MRI)

## 2024-02-26 ENCOUNTER — Ambulatory Visit
Admission: RE | Admit: 2024-02-26 | Discharge: 2024-02-26 | Disposition: A | Discharge status: Home / Self Care | Source: Ambulatory Visit | Attending: General Surgery | Admitting: General Surgery

## 2024-02-26 DIAGNOSIS — K573 Diverticulosis of large intestine 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 or abscess without bleeding: Secondary | ICD-10-CM | POA: Diagnosis not present

## 2024-02-26 DIAGNOSIS — N200 Calculus of kidney: Secondary | ICD-10-CM | POA: Diagnosis not present

## 2024-02-26 DIAGNOSIS — Z903 Acquired absence of stomach [part of]: Secondary | ICD-10-CM

## 2024-02-26 DIAGNOSIS — L918 Other hypertrophic disorders of the skin: Secondary | ICD-10-CM

## 2024-02-26 MED ORDER — IOPAMIDOL (ISOVUE-370) INJECTION 76%
80.0000 mL | Freq: Once | INTRAVENOUS | Status: AC | PRN
  Administered 2024-02-26: 80 mL via INTRAVENOUS

## 2024-03-01 ENCOUNTER — Encounter: Payer: Self-pay | Admitting: Family

## 2024-03-01 ENCOUNTER — Other Ambulatory Visit: Payer: Self-pay

## 2024-03-01 ENCOUNTER — Other Ambulatory Visit: Payer: Self-pay | Admitting: Family

## 2024-03-01 MED ORDER — MIRTAZAPINE 15 MG PO TABS: 15.0000 mg | ORAL_TABLET | Freq: Every day | ORAL | 2 refills | Status: DC

## 2024-03-02 ENCOUNTER — Ambulatory Visit: Payer: Self-pay | Admitting: General Surgery

## 2024-03-17 NOTE — Progress Notes (Signed)
 Sent message, via epic in basket, requesting orders in epic from Careers adviser.

## 2024-03-24 NOTE — Progress Notes (Signed)
 COVID Vaccine received:  []  No []  Yes Date of any COVID positive Test in last 90 days:  PCP - Eleanor Ponto, NP  Cardiologist -Jennifer Crape, MD  (LOV 05-31-2021) follow up prn Neurology- Juliene Dunnings, DO  Chest x-ray - 02-18-2023  2v  Epic EKG -  06-10-2023  Epic Stress Test -  ECHO -  Cardiac Cath - 04-18-2021  [facility] / [facility]  by Dr. Court CT Coronary Calcium  score:   Pacemaker / ICD device [x]  No []  Yes   Spinal Cord Stimulator:[x]  No []  Yes       History of Sleep Apnea? [x]  No []  Yes   CPAP used?- [x]  No []  Yes    Does the patient monitor blood sugar?   [x]  N/A   []  No []  Yes  Patient has: [x]  NO Hx DM   []  Pre-DM   []  DM1  []   DM2  Blood Thinner / Instructions:  none Aspirin  Instructions:  none  ERAS Protocol Ordered: []  No  [x]  Yes PRE-SURGERY []  ENSURE  []  G2   Patient is to be NPO after:  ????  NO ORDERS, Called [facility] and received verbal for ERAS.   Dental hx: []  Dentures:  []  N/A      []  Bridge or Partial:                   [x]  Loose or Damaged teeth: Poor dentition  Activity level: Able to walk up 2 flights of stairs without becoming significantly short of breath or having chest pain?  [x]  No   []    Yes   Anesthesia review: depression, seizure (Last one ? 8 years ago), GERD, HTN,  hx murmur, anemia, Has slow growing meningioma on the left side of brain - See Dr. Dunnings, cavitary lung disease- single episode of lung infection 2024, anemia,   HOH-   Patient denies any S&S of respiratory illness or Covid - no shortness of breath, fever, cough or chest pain at PAT appointment.  Patient verbalized understanding and agreement to the Pre-Surgical Instructions that were given to them at this PAT appointment. Patient was also educated of the need to review these PAT instructions again prior to his/her surgery.I reviewed the appropriate phone numbers to call if they have any and questions or concerns.

## 2024-03-25 ENCOUNTER — Encounter (HOSPITAL_COMMUNITY)
Admission: RE | Admit: 2024-03-25 | Discharge: 2024-03-25 | Disposition: A | Discharge status: Home / Self Care | Source: Ambulatory Visit | Attending: General Surgery | Admitting: General Surgery

## 2024-03-25 ENCOUNTER — Other Ambulatory Visit: Payer: Self-pay

## 2024-03-25 ENCOUNTER — Ambulatory Visit: Payer: Self-pay | Admitting: General Surgery

## 2024-03-25 ENCOUNTER — Encounter (HOSPITAL_COMMUNITY): Payer: Self-pay

## 2024-03-25 VITALS — BP 130/88 | HR 85 | Temp 98.8°F | Resp 14 | Ht 64.0 in | Wt 134.0 lb

## 2024-03-25 DIAGNOSIS — M1990 Unspecified osteoarthritis, unspecified site: Secondary | ICD-10-CM | POA: Diagnosis not present

## 2024-03-25 DIAGNOSIS — K219 Gastro-esophageal reflux disease without esophagitis: Secondary | ICD-10-CM | POA: Insufficient documentation

## 2024-03-25 DIAGNOSIS — I10 Essential (primary) hypertension: Secondary | ICD-10-CM | POA: Insufficient documentation

## 2024-03-25 DIAGNOSIS — L918 Other hypertrophic disorders of the skin: Secondary | ICD-10-CM | POA: Diagnosis not present

## 2024-03-25 DIAGNOSIS — T8189XA Other complications of procedures, not elsewhere classified, initial encounter: Secondary | ICD-10-CM | POA: Insufficient documentation

## 2024-03-25 DIAGNOSIS — G40209 Localization-related (focal) (partial) symptomatic epilepsy and epileptic syndromes with complex partial seizures, not intractable, without status epilepticus: Secondary | ICD-10-CM | POA: Diagnosis not present

## 2024-03-25 DIAGNOSIS — Y839 Surgical procedure, unspecified as the cause of abnormal reaction of the patient, or of later complication, without mention of misadventure at the time of the procedure: Secondary | ICD-10-CM | POA: Insufficient documentation

## 2024-03-25 DIAGNOSIS — Z01818 Encounter for other preprocedural examination: Secondary | ICD-10-CM

## 2024-03-25 DIAGNOSIS — Z01812 Encounter for preprocedural laboratory examination: Secondary | ICD-10-CM | POA: Insufficient documentation

## 2024-03-25 DIAGNOSIS — F419 Anxiety disorder, unspecified: Secondary | ICD-10-CM | POA: Diagnosis not present

## 2024-03-25 DIAGNOSIS — F32A Depression, unspecified: Secondary | ICD-10-CM | POA: Diagnosis not present

## 2024-03-25 DIAGNOSIS — Z903 Acquired absence of stomach [part of]: Secondary | ICD-10-CM | POA: Insufficient documentation

## 2024-03-25 HISTORY — DX: Personal history of other diseases of the digestive system: Z87.19

## 2024-03-25 HISTORY — DX: Anxiety disorder, unspecified: F41.9

## 2024-03-25 LAB — COMPREHENSIVE METABOLIC PANEL WITH GFR
ALT: 15 U/L (ref 0–44)
AST: 24 U/L (ref 15–41)
Albumin: 3.9 g/dL (ref 3.5–5.0)
Alkaline Phosphatase: 111 U/L (ref 38–126)
Anion gap: 12 (ref 5–15)
BUN: 15 mg/dL (ref 8–23)
CO2: 22 mmol/L (ref 22–32)
Calcium: 8.9 mg/dL (ref 8.9–10.3)
Chloride: 106 mmol/L (ref 98–111)
Creatinine, Ser: 0.87 mg/dL (ref 0.44–1.00)
GFR, Estimated: >60 mL/min (ref >60)
Glucose, Bld: 89 mg/dL (ref 70–99)
Potassium: 3.8 mmol/L (ref 3.5–5.1)
Sodium: 140 mmol/L (ref 135–145)
Total Bilirubin: 0.5 mg/dL (ref 0.0–1.2)
Total Protein: 7.1 g/dL (ref 6.5–8.1)

## 2024-03-25 LAB — CBC
HCT: 46.7 % — ABNORMAL HIGH (ref 36.0–46.0)
Hemoglobin: 14.4 g/dL (ref 12.0–15.0)
MCH: 28.1 pg (ref 26.0–34.0)
MCHC: 30.8 g/dL (ref 30.0–36.0)
MCV: 91.2 fL (ref 80.0–100.0)
Platelets: 347 K/uL (ref 150–400)
RBC: 5.12 MIL/uL — ABNORMAL HIGH (ref 3.87–5.11)
RDW: 15.1 % (ref 11.5–15.5)
WBC: 6.1 K/uL (ref 4.0–10.5)
nRBC: 0 % (ref 0.0–0.2)

## 2024-03-25 NOTE — Patient Instructions (Signed)
 SURGICAL WAITING ROOM VISITATION Patients having surgery or a procedure may have no more than 2 support people in the waiting area - these visitors may rotate in the visitor waiting room.   If the patient needs to stay at the hospital during part of their recovery, the visitor guidelines for inpatient rooms apply.  PRE-OP VISITATION  Pre-op nurse will coordinate an appropriate time for 1 support person to accompany the patient in pre-op.  This support person may not rotate.  This visitor will be contacted when the time is appropriate for the visitor to come back in the pre-op area.  Please refer to the [facility] website for the visitor guidelines for Inpatients (after your surgery is over and you are in a regular room).  You are not required to quarantine at this time prior to your surgery. However, you must do this: Hand Hygiene often Do NOT share personal items Notify your provider if you are in close contact with someone who has COVID or you develop fever 100.4 or greater, new onset of sneezing, cough, sore throat, shortness of breath or body aches.  If you test positive for Covid or have been in contact with anyone that has tested positive in the last 10 days please notify you surgeon.    Your procedure is scheduled on:  Friday  April 02, 2024  Report to [facility] Main Entrance: [address] entrance where the [employer] is available.   Report to admitting at:  11:15   AM  Call this number if you have any questions or problems the morning of surgery [phone]  FOLLOW ANY ADDITIONAL PRE OP INSTRUCTIONS YOU RECEIVED FROM YOUR SURGEON'S OFFICE!!!  Do not eat food after Midnight the night prior to your surgery/procedure.  After Midnight you may have the following liquids until  10:30  AM DAY OF SURGERY  Clear Liquid Diet Water  Black Coffee (sugar ok, NO MILK/CREAM OR CREAMERS)  Tea (sugar ok, NO MILK/CREAM OR CREAMERS) regular and decaf                              Plain Jell-O  with no fruit (NO RED)                                           Fruit ices (not with fruit pulp, NO RED)                                     Popsicles (NO RED)                                                                  Juice: NO CITRUS JUICES: only apple, WHITE grape, WHITE cranberry Sports drinks like Gatorade or Powerade (NO RED)                Oral Hygiene is also important to reduce your risk of infection.        Remember - BRUSH YOUR TEETH THE MORNING OF SURGERY WITH YOUR REGULAR TOOTHPASTE  Do NOT  smoke after Midnight the night before surgery.  STOP TAKING all Vitamins, Herbs and supplements 1 week before your surgery.   Take ONLY these medicines the morning of surgery with A SIP OF WATER :   Buspirone , Keppra , and Myrbetriq , You may use your Eye Drops if needed.                    You may not have any metal on your body including hair pins, jewelry, and body piercing  Do not wear make-up, lotions, powders, perfumes or deodorant  Do not wear nail polish including gel and S&S, artificial / acrylic nails, or any other type of covering on natural nails including finger and toenails. If you have artificial nails, gel coating, etc., that needs to be removed by a nail salon, Please have this removed prior to surgery. Not doing so may mean that your surgery could be cancelled or delayed if the Surgeon or anesthesia staff feels like they are unable to monitor you safely.   Do not shave 48 hours prior to surgery to avoid nicks in your skin which may contribute to postoperative infections.   Contacts, Hearing Aids, dentures or bridgework may not be worn into surgery. DENTURES WILL BE REMOVED PRIOR TO SURGERY PLEASE DO NOT APPLY Poly grip OR ADHESIVES!!!  Patients discharged on the day of surgery will not be allowed to drive home.  Someone NEEDS to stay with you for the first 24 hours after anesthesia.  Do not bring your home medications to the hospital. The Pharmacy  will dispense medications listed on your medication list to you during your admission in the Hospital.  Please read over the following fact sheets you were given: IF YOU HAVE QUESTIONS ABOUT YOUR PRE-OP INSTRUCTIONS, PLEASE CALL [phone]   [facility] - Preparing for Surgery Before surgery, you can play an important role.  Because skin is not sterile, your skin needs to be as free of germs as possible.  You can reduce the number of germs on your skin by washing with CHG (chlorahexidine gluconate) soap before surgery.  CHG is an antiseptic cleaner which kills germs and bonds with the skin to continue killing germs even after washing. Please DO NOT use if you have an allergy to CHG or antibacterial soaps.  If your skin becomes reddened/irritated stop using the CHG and inform your nurse when you arrive at Short Stay. Do not shave (including legs and underarms) for at least 48 hours prior to the first CHG shower.  You may shave your face/neck.  Please follow these instructions carefully:  1.  Shower with CHG Soap the night before surgery and the  morning of surgery.  2.  If you choose to wash your hair, wash your hair first as usual with your normal  shampoo.  3.  After you shampoo, rinse your hair and body thoroughly to remove the shampoo.                             4.  Use CHG as you would any other liquid soap.  You can apply chg directly to the skin and wash.  Gently with a scrungie or clean washcloth.  5.  Apply the CHG Soap to your body ONLY FROM THE NECK DOWN.   Do not use on face/ open  Wound or open sores. Avoid contact with eyes, ears mouth and genitals (private parts).                       Wash face,  Genitals (private parts) with your normal soap.             6.  Wash thoroughly, paying special attention to the area where your  surgery  will be performed.  7.  Thoroughly rinse your body with warm water  from the neck down.  8.  DO NOT shower/wash with your  normal soap after using and rinsing off the CHG Soap.            9.  Pat yourself dry with a clean towel.            10.  Wear clean pajamas.            11.  Place clean sheets on your bed the night of your first shower and do not  sleep with pets.  ON THE DAY OF SURGERY : Do not apply any lotions/deodorants the morning of surgery.  Please wear clean clothes to the hospital/surgery center.     FAILURE TO FOLLOW THESE INSTRUCTIONS MAY RESULT IN THE CANCELLATION OF YOUR SURGERY  PATIENT SIGNATURE_________________________________  NURSE SIGNATURE__________________________________  ________________________________________________________________________

## 2024-03-29 ENCOUNTER — Encounter (HOSPITAL_COMMUNITY): Payer: Self-pay

## 2024-03-29 NOTE — Progress Notes (Signed)
 Case: [id number] Date/Time: 04/02/24 1315   Procedure: EXCISION, LESION, TORSO - excision nonhealing abdominal wall wound   Anesthesia type: General   Diagnosis:      Hypertrophic granulation tissue [L91.8]     Status post gastrectomy [Z90.3]   Pre-op diagnosis: NON HEALING ABDOMINAL WALL WOUND   [facility]   Surgeons: Tanda Locus, MD       DISCUSSION: Lindsay Frost is a 68 yo with PMH of HTN, hx of cavitary lung lesions, hx of seizures, meningioma, migraines, chronic gastric outlet obstruction 2/2 PUD s/p distal gastrectomy with Roux-en-Y reconfiguration with G-tube and J-tube placement (06/2023), GERD, depression, arthritis.  Follows with Neurology for hx of migraines and focal seizures. Last seen on 02/16/24. She takes Keppra . Last seizures was reportedly in 2016.   Hx of cavitary lung lesions found incidentally by CXR in 2024, followed by Pulmonology. She was treated with Augmentin  and follow up CT chest showed that lung lesions were resolving. Bronch deferred.   Had a cath in 2022 due to chest pain. Cath was negative for CAD.  Seen by PCP for anxiety and depression on 12/23/23. All other issues stable.  VS: BP 130/88 Comment: right arm sitting  Pulse 85   Temp 37.1 C (Oral)   Resp 14   Ht 5' 4 (1.626 m)   Wt 60.8 kg   SpO2 100%   BMI 23.00 kg/m   PROVIDERS: Daryl Setter, NP   LABS: Labs reviewed: Acceptable for surgery. (all labs ordered are listed, but only abnormal results are displayed)  Labs Reviewed  CBC - Abnormal; Notable for the following components:      Result Value   RBC 5.12 (*)    HCT 46.7 (*)    All other components within normal limits  COMPREHENSIVE METABOLIC PANEL WITH GFR     IMAGES: CT Chest 04/25/23:  IMPRESSION: 1. Near complete resolution of the cavitary process within the left lower lobe and lingula from prior exam. Mild residual bandlike scarring. The adjacent nodular densities have resolved. Findings  are most consistent with resolving infectious process. 2. Stable small areas of tree in bud/clustered nodularity in the periphery of the right lower lobe, typically post infectious/inflammatory. 3. Additional small pulmonary nodules are unchanged, largest 5 mm. No specific imaging follow-up is recommended.   Aortic Atherosclerosis (ICD10-I70.0).  EKG:   CV:  LHC 04/18/2021:     The left ventricular systolic function is normal.   LV end diastolic pressure is normal.   The left ventricular ejection fraction is 55-65% by visual estimate.  Past Medical History:  Diagnosis Date   Acute midline thoracic back pain 02/16/2021   Acute sinusitis 09/05/2014   Allergic rhinitis 07/16/2016   Allergy 2005   Anemia 2021   Anxiety    Arthritis    B12 deficiency 01/02/2021   Back pain    arthritis   Chronic sinusitis 12/05/2020   Depression    takes cymbalta  daily   Diarrhea 02/16/2021   Duodenal stenosis    Epigastric pain 07/29/2018   Essential hypertension 05/14/2019   Gallstones    Generalized abdominal pain 02/16/2021   GERD (gastroesophageal reflux disease)    takes Omeprazole  daily   Headache    Heart murmur    Anesthesia PA listen to chest, no murmur appreciated, 03-25-24   History of gastric ulcer    History of hiatal hernia    History of migraine    last one about 2wks ago-takes Imitrex  prn  History of seizures    Joint pain    Joint swelling    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 Skeet sees her   Migraine without aura and without status migrainosus, not intractable 01/13/2015   Mixed dyslipidemia 07/25/2020   Myringotomy tube status 02/27/2021   Nausea 02/16/2021   Nocturnal leg cramps 02/20/2015   Obesity (BMI 30-39.9) 07/22/2014   Osteoarthritis of left knee 12/02/2012   Overactive bladder 01/02/2021   Peptic ulcer disease     Physical exam 07/22/2014   Recurrent acute serous otitis media of left ear 12/05/2020   Seizure-like activity (HCC) 05/12/2014   Last one was ~ 2017    Past Surgical History:  Procedure Laterality Date   BALLOON DILATION  04/03/2012   Procedure: BALLOON DILATION;  Surgeon: Belvie JONETTA Just, MD;  [facility];  Service: Endoscopy;  Laterality: N/A;   BIOPSY  08/24/2019   Procedure: BIOPSY;  Surgeon: San Sandor GAILS, DO;  [facility];  Service: Gastroenterology;;  EGD and COLON   BRAVO PH STUDY N/A 08/24/2019   Procedure: BRAVO PH STUDY;  Surgeon: San Sandor GAILS, DO;  [facility];  Service: Gastroenterology;  Laterality: N/A;   CHOLECYSTECTOMY N/A 04/24/2018   Procedure: LAPAROSCOPIC CHOLECYSTECTOMY;  Surgeon: Signe Mitzie LABOR, MD;  [facility];  Service: General;  Laterality: N/A;   COLONOSCOPY     COLONOSCOPY WITH PROPOFOL  N/A 08/24/2019   Procedure: COLONOSCOPY WITH PROPOFOL ;  Surgeon: San Sandor GAILS, DO;  [facility];  Service: Gastroenterology;  Laterality: N/A;   DIAGNOSTIC LAPAROSCOPY  51yrs ago   ESOPHAGEAL MANOMETRY N/A 09/04/2015   Procedure: ESOPHAGEAL MANOMETRY (EM);  Surgeon: Belvie Just, MD;  [facility];  Service: Endoscopy;  Laterality: N/A;   ESOPHAGEAL MANOMETRY N/A 12/25/2022   Procedure: ESOPHAGEAL MANOMETRY (EM);  Surgeon: San Sandor GAILS, DO;  [facility];  Service: Gastroenterology;  Laterality: N/A;   ESOPHAGOGASTRODUODENOSCOPY  04/03/2012   Procedure: ESOPHAGOGASTRODUODENOSCOPY (EGD);  Surgeon: Belvie JONETTA Just, MD;  Location: [facility];  Service: Endoscopy;  Laterality: N/A;  EGD w/ balloon dilation   ESOPHAGOGASTRODUODENOSCOPY (EGD) WITH PROPOFOL  N/A 02/18/2014   Procedure: ESOPHAGOGASTRODUODENOSCOPY (EGD) WITH PROPOFOL ;  Surgeon: Belvie JONETTA Just, MD;  [facility];  Service: Endoscopy;  Laterality: N/A;   ESOPHAGOGASTRODUODENOSCOPY (EGD) WITH PROPOFOL  N/A 04/22/2014    Procedure: ESOPHAGOGASTRODUODENOSCOPY (EGD) WITH PROPOFOL ;  Surgeon: Belvie JONETTA Just, MD;  [facility];  Service: Endoscopy;  Laterality: N/A;   ESOPHAGOGASTRODUODENOSCOPY (EGD) WITH PROPOFOL  N/A 08/24/2019   Procedure: ESOPHAGOGASTRODUODENOSCOPY (EGD) WITH PROPOFOL ;  Surgeon: San Sandor GAILS, DO;  [facility];  Service: Gastroenterology;  Laterality: N/A;   EYE SURGERY Bilateral 2021   cataract extraction   GASTRECTOMY N/A 06/06/2023   Procedure: DISTAL GASTRECTOMY;  Surgeon: Tanda Locus, MD;  Location: [facility];  Service: General;  Laterality: N/A;   GASTROSTOMY N/A 06/06/2023   Procedure: PLACEMENT OF GASTRIC AND JEJUNAL FEEDING TUBES;  Surgeon: Tanda Locus, MD;  Location: [facility] OR;  Service: General;  Laterality: N/A;   IR REPLC GASTRO/COLONIC TUBE PERCUT W/FLUORO  07/07/2023   LAPAROTOMY N/A 06/06/2023   Procedure: EXPLORATION LAPAROTOMY;  Surgeon: Tanda Locus, MD;  Location: [facility] OR;  Service: General;  Laterality: N/A;   LAPAROTOMY N/A 06/18/2023   Procedure: EXPLORATION LAPAROTOMY, INSERTION OF JEJUNAL FEEDING TUBE;  Surgeon: Teresa Lonni HERO, MD;  [facility];  Service:  General;  Laterality: N/A;   LEFT HEART CATH AND CORONARY ANGIOGRAPHY N/A 04/18/2021   Procedure: LEFT HEART CATH AND CORONARY ANGIOGRAPHY;  Surgeon: Court Dorn PARAS, MD;  [facility];  Service: Cardiovascular;  Laterality: N/A;   NASAL SEPTUM SURGERY  09/2016   PH IMPEDANCE STUDY N/A 09/04/2015   Procedure: PH IMPEDANCE STUDY;  Surgeon: Belvie Just, MD;  [facility];  Service: Endoscopy;  Laterality: N/A;   REPLACEMENT TOTAL KNEE Right 25yrs ago   TOTAL KNEE ARTHROPLASTY Left 11/30/2012   Procedure: LEFT TOTAL KNEE ARTHROPLASTY;  Surgeon: Dempsey PARAS Sensor, MD;  [facility];  Service: Orthopedics;  Laterality: Left;    MEDICATIONS:  acetaminophen  (TYLENOL ) 500 MG tablet   albuterol  (VENTOLIN  HFA) 108 (90 Base) MCG/ACT inhaler   alendronate  (FOSAMAX ) 70 MG  tablet   ALPRAZolam  (XANAX ) 0.5 MG tablet   aspirin  EC 81 MG tablet   busPIRone  (BUSPAR ) 15 MG tablet   eletriptan  (RELPAX ) 40 MG tablet   fluticasone  (FLONASE ) 50 MCG/ACT nasal spray   levETIRAcetam  (KEPPRA ) 1000 MG tablet   levETIRAcetam  (KEPPRA ) 500 MG tablet   lidocaine  (LIDODERM ) 5 %   liver oil-zinc  oxide (DESITIN) 40 % ointment   methocarbamol  (ROBAXIN ) 500 MG tablet   metoCLOPramide  (REGLAN ) 10 MG tablet   mirtazapine  (REMERON ) 15 MG tablet   Multiple Vitamins-Iron  (MULTIVITAMINS WITH IRON ) TABS tablet   MYRBETRIQ  25 MG TB24 tablet   nitroGLYCERIN  (NITROSTAT ) 0.4 MG SL tablet   ondansetron  (ZOFRAN -ODT) 4 MG disintegrating tablet   pantoprazole  (PROTONIX ) 40 MG tablet   Prucalopride Succinate  (MOTEGRITY ) 2 MG TABS   RESTASIS  0.05 % ophthalmic emulsion   No current facility-administered medications for this encounter.   Burnard CHRISTELLA Odis DEVONNA [facility] Short Stay/Anesthesiology [facility] Phone [phone] 03/29/2024 1:22 PM

## 2024-03-29 NOTE — Anesthesia Preprocedure Evaluation (Addendum)
 Anesthesia Evaluation  Patient identified by MRN, date of birth, ID band Patient awake    Reviewed: Allergy & Precautions, NPO status , Patient's Chart, lab work & pertinent test results  Airway Mallampati: I  TM Distance: >3 FB Neck ROM: Full    Dental  (+) Dental Advisory Given, Poor Dentition, Chipped   Pulmonary  Hx of cavitary lung lesions found incidentally by CXR in 2024, followed by Pulmonology. She was treated with Augmentin  and follow up CT chest showed that lung lesions were resolving. Bronch deferred.     Pulmonary exam normal breath sounds clear to auscultation       Cardiovascular hypertension (no home meds, 169/93 preop), Normal cardiovascular exam Rhythm:Regular Rate:Normal  Cath 2022 for chest pain: no CAD   Neuro/Psych  Headaches, Seizures - (takes keppra  qhs, took last night), Well Controlled,  PSYCHIATRIC DISORDERS Anxiety Depression    Meningioma being monitored with serial scans  Follows with Neurology for hx of migraines and focal seizures. Last seen on 02/16/24. She takes Keppra . Last seizures was reportedly in 2016.     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-tube placement (06/2023)   Endo/Other  negative endocrine ROS    Renal/GU negative Renal ROS Bladder dysfunction (OAB)      Musculoskeletal  (+) Arthritis , Osteoarthritis,    Abdominal   Peds  Hematology negative hematology ROS (+)   Anesthesia Other Findings   Reproductive/Obstetrics negative OB ROS                              Anesthesia Physical Anesthesia Plan  ASA: 3  Anesthesia Plan: General   Post-op Pain Management: Tylenol  PO (pre-op)*   Induction: Intravenous  PONV Risk Score and Plan: 3 and Ondansetron , Dexamethasone , Midazolam  and Treatment may vary due to  age or medical condition  Airway Management Planned: LMA  Additional Equipment: None  Intra-op Plan:   Post-operative Plan: Extubation in OR  Informed Consent: I have reviewed the patients History and Physical, chart, labs and discussed the procedure including the risks, benefits and alternatives for the proposed anesthesia with the patient or authorized representative who has indicated his/her understanding and acceptance.     Dental advisory given  Plan Discussed with: CRNA  Anesthesia Plan Comments:          Anesthesia Quick Evaluation

## 2024-04-01 NOTE — Progress Notes (Signed)
 52 Spoke with Ms.Tumbleson about the new time change to arrive at 0715 to Admitting.  NPO past midnight and to take her medications by 0630 with a sip of water .  She was able to repeat the instructions back without questions.

## 2024-04-02 ENCOUNTER — Other Ambulatory Visit: Payer: Self-pay

## 2024-04-02 ENCOUNTER — Encounter (HOSPITAL_COMMUNITY): Payer: Self-pay | Admitting: General Surgery

## 2024-04-02 ENCOUNTER — Ambulatory Visit (HOSPITAL_COMMUNITY): Admitting: Anesthesiology

## 2024-04-02 ENCOUNTER — Ambulatory Visit (HOSPITAL_COMMUNITY): Payer: Self-pay | Admitting: Physician Assistant

## 2024-04-02 ENCOUNTER — Encounter (HOSPITAL_COMMUNITY)
Admission: RE | Disposition: A | Discharge status: Home / Self Care | Payer: Self-pay | Source: Home / Self Care | Attending: General Surgery

## 2024-04-02 ENCOUNTER — Ambulatory Visit (HOSPITAL_COMMUNITY)
Admission: RE | Admit: 2024-04-02 | Discharge: 2024-04-02 | Disposition: A | Discharge status: Home / Self Care | Attending: General Surgery | Admitting: General Surgery

## 2024-04-02 DIAGNOSIS — S31101A Unspecified open wound of abdominal wall, left upper quadrant without penetration into peritoneal cavity, initial encounter: Secondary | ICD-10-CM | POA: Diagnosis not present

## 2024-04-02 DIAGNOSIS — F418 Other specified anxiety disorders: Secondary | ICD-10-CM | POA: Diagnosis not present

## 2024-04-02 DIAGNOSIS — X58XXXA Exposure to other specified factors, initial encounter: Secondary | ICD-10-CM | POA: Insufficient documentation

## 2024-04-02 DIAGNOSIS — I2510 Atherosclerotic heart disease of native coronary artery without angina pectoris: Secondary | ICD-10-CM

## 2024-04-02 DIAGNOSIS — L918 Other hypertrophic disorders of the skin: Secondary | ICD-10-CM | POA: Diagnosis not present

## 2024-04-02 DIAGNOSIS — K219 Gastro-esophageal reflux disease without esophagitis: Secondary | ICD-10-CM | POA: Diagnosis not present

## 2024-04-02 DIAGNOSIS — K469 Unspecified abdominal hernia without obstruction or gangrene: Secondary | ICD-10-CM | POA: Diagnosis not present

## 2024-04-02 DIAGNOSIS — I10 Essential (primary) hypertension: Secondary | ICD-10-CM | POA: Insufficient documentation

## 2024-04-02 DIAGNOSIS — Z903 Acquired absence of stomach [part of]: Secondary | ICD-10-CM | POA: Diagnosis present

## 2024-04-02 DIAGNOSIS — S31109A Unspecified open wound of abdominal wall, unspecified quadrant without penetration into peritoneal cavity, initial encounter: Secondary | ICD-10-CM | POA: Diagnosis not present

## 2024-04-02 HISTORY — PX: LESION REMOVAL: SHX5196

## 2024-04-02 SURGERY — EXCISION, LESION, TORSO
Anesthesia: General | Site: Abdomen

## 2024-04-02 MED ORDER — CEFAZOLIN SODIUM-DEXTROSE 2-4 GM/100ML-% IV SOLN
2.0000 g | INTRAVENOUS | Status: AC
  Administered 2024-04-02: 2 g via INTRAVENOUS
  Filled 2024-04-02: qty 100

## 2024-04-02 MED ORDER — DEXAMETHASONE SODIUM PHOSPHATE 10 MG/ML IJ SOLN
INTRAMUSCULAR | Status: DC | PRN
  Administered 2024-04-02: 5 mg via INTRAVENOUS

## 2024-04-02 MED ORDER — BUPIVACAINE-EPINEPHRINE (PF) 0.25% -1:200000 IJ SOLN
INTRAMUSCULAR | Status: AC
  Filled 2024-04-02: qty 30

## 2024-04-02 MED ORDER — CHLORHEXIDINE GLUCONATE CLOTH 2 % EX PADS: 6.0000 | MEDICATED_PAD | Freq: Once | CUTANEOUS | Status: DC

## 2024-04-02 MED ORDER — ACETAMINOPHEN 500 MG PO TABS
1000.0000 mg | ORAL_TABLET | Freq: Three times a day (TID) | ORAL | Status: AC
Start: 2024-04-02 — End: 2024-04-07

## 2024-04-02 MED ORDER — FENTANYL CITRATE (PF) 100 MCG/2ML IJ SOLN
INTRAMUSCULAR | Status: AC
  Filled 2024-04-02: qty 2

## 2024-04-02 MED ORDER — PROPOFOL 10 MG/ML IV BOLUS
INTRAVENOUS | Status: AC
  Filled 2024-04-02: qty 20

## 2024-04-02 MED ORDER — MIDAZOLAM HCL 2 MG/2ML IJ SOLN
INTRAMUSCULAR | Status: AC
  Filled 2024-04-02: qty 2

## 2024-04-02 MED ORDER — CHLORHEXIDINE GLUCONATE 0.12 % MT SOLN
15.0000 mL | Freq: Once | OROMUCOSAL | Status: AC
  Administered 2024-04-02: 15 mL via OROMUCOSAL

## 2024-04-02 MED ORDER — ACETAMINOPHEN 500 MG PO TABS
1000.0000 mg | ORAL_TABLET | ORAL | Status: AC
  Administered 2024-04-02: 1000 mg via ORAL
  Filled 2024-04-02: qty 2

## 2024-04-02 MED ORDER — HYDROMORPHONE HCL 1 MG/ML IJ SOLN: 0.2500 mg | INTRAMUSCULAR | Status: DC | PRN

## 2024-04-02 MED ORDER — ONDANSETRON HCL 4 MG/2ML IJ SOLN
INTRAMUSCULAR | Status: AC
  Filled 2024-04-02: qty 2

## 2024-04-02 MED ORDER — 0.9 % SODIUM CHLORIDE (POUR BTL) OPTIME
TOPICAL | Status: DC | PRN
  Administered 2024-04-02: 1000 mL

## 2024-04-02 MED ORDER — DEXAMETHASONE SODIUM PHOSPHATE 10 MG/ML IJ SOLN
INTRAMUSCULAR | Status: AC
  Filled 2024-04-02: qty 1

## 2024-04-02 MED ORDER — FENTANYL CITRATE (PF) 100 MCG/2ML IJ SOLN
INTRAMUSCULAR | Status: DC | PRN
  Administered 2024-04-02 (×4): 50 ug via INTRAVENOUS

## 2024-04-02 MED ORDER — BUPIVACAINE-EPINEPHRINE 0.25% -1:200000 IJ SOLN
INTRAMUSCULAR | Status: DC | PRN
  Administered 2024-04-02: 20 mL

## 2024-04-02 MED ORDER — OXYCODONE HCL 5 MG PO TABS: 5.0000 mg | ORAL_TABLET | Freq: Once | ORAL | Status: DC | PRN

## 2024-04-02 MED ORDER — PHENYLEPHRINE 80 MCG/ML (10ML) SYRINGE FOR IV PUSH (FOR BLOOD PRESSURE SUPPORT)
PREFILLED_SYRINGE | INTRAVENOUS | Status: DC | PRN
  Administered 2024-04-02 (×4): 80 ug via INTRAVENOUS

## 2024-04-02 MED ORDER — LACTATED RINGERS IV SOLN: INTRAVENOUS | Status: DC

## 2024-04-02 MED ORDER — KETOROLAC TROMETHAMINE 30 MG/ML IJ SOLN
INTRAMUSCULAR | Status: DC | PRN
  Administered 2024-04-02: 15 mg via INTRAVENOUS

## 2024-04-02 MED ORDER — LIDOCAINE HCL (CARDIAC) PF 100 MG/5ML IV SOSY
PREFILLED_SYRINGE | INTRAVENOUS | Status: DC | PRN
  Administered 2024-04-02: 60 mg via INTRAVENOUS

## 2024-04-02 MED ORDER — ORAL CARE MOUTH RINSE: 15.0000 mL | Freq: Once | OROMUCOSAL | Status: AC

## 2024-04-02 MED ORDER — AMISULPRIDE (ANTIEMETIC) 5 MG/2ML IV SOLN: 10.0000 mg | Freq: Once | INTRAVENOUS | Status: DC | PRN

## 2024-04-02 MED ORDER — PROPOFOL 10 MG/ML IV BOLUS
INTRAVENOUS | Status: DC | PRN
  Administered 2024-04-02: 130 mg via INTRAVENOUS
  Administered 2024-04-02: 70 mg via INTRAVENOUS

## 2024-04-02 MED ORDER — OXYCODONE HCL 5 MG/5ML PO SOLN: 5.0000 mg | Freq: Once | ORAL | Status: DC | PRN

## 2024-04-02 MED ORDER — TRAMADOL HCL 50 MG PO TABS: 50.0000 mg | ORAL_TABLET | Freq: Four times a day (QID) | ORAL | 0 refills | Status: AC | PRN

## 2024-04-02 MED ORDER — MIDAZOLAM HCL 2 MG/2ML IJ SOLN
INTRAMUSCULAR | Status: DC | PRN
  Administered 2024-04-02: 2 mg via INTRAVENOUS

## 2024-04-02 MED ORDER — ONDANSETRON HCL 4 MG/2ML IJ SOLN: 4.0000 mg | Freq: Once | INTRAMUSCULAR | Status: DC | PRN

## 2024-04-02 MED ORDER — ONDANSETRON HCL 4 MG/2ML IJ SOLN
INTRAMUSCULAR | Status: DC | PRN
  Administered 2024-04-02: 4 mg via INTRAVENOUS

## 2024-04-02 MED ORDER — LIDOCAINE HCL (PF) 2 % IJ SOLN
INTRAMUSCULAR | Status: AC
  Filled 2024-04-02: qty 5

## 2024-04-02 MED ORDER — KETOROLAC TROMETHAMINE 30 MG/ML IJ SOLN
INTRAMUSCULAR | Status: AC
  Filled 2024-04-02: qty 1

## 2024-04-02 SURGICAL SUPPLY — 35 items
BAG COUNTER SPONGE SURGICOUNT (BAG) IMPLANT
BENZOIN TINCTURE PRP APPL 2/3 (GAUZE/BANDAGES/DRESSINGS) IMPLANT
COVER SURGICAL LIGHT HANDLE (MISCELLANEOUS) ×1 IMPLANT
DERMABOND ADVANCED .7 DNX12 (GAUZE/BANDAGES/DRESSINGS) IMPLANT
DRAPE LAPAROSCOPIC ABDOMINAL (DRAPES) IMPLANT
DRAPE LAPAROTOMY T 102X78X121 (DRAPES) IMPLANT
DRAPE LAPAROTOMY T 98X78 PEDS (DRAPES) IMPLANT
DRAPE LAPAROTOMY TRNSV 102X78 (DRAPES) IMPLANT
DRAPE SHEET LG 3/4 BI-LAMINATE (DRAPES) IMPLANT
DRAPE UTILITY XL STRL (DRAPES) ×1 IMPLANT
DRSG OPSITE POSTOP 4X6 (GAUZE/BANDAGES/DRESSINGS) IMPLANT
ELECT REM PT RETURN 15FT ADLT (MISCELLANEOUS) ×1 IMPLANT
ELECTRODE CAUTERY BLDE TIP 2.5 (TIP) IMPLANT
GAUZE PACKING IODOFORM 1/4X15 (PACKING) IMPLANT
GAUZE SPONGE 4X4 12PLY STRL (GAUZE/BANDAGES/DRESSINGS) ×1 IMPLANT
GLOVE BIO SURGEON STRL SZ7.5 (GLOVE) ×1 IMPLANT
GLOVE INDICATOR 8.0 STRL GRN (GLOVE) ×1 IMPLANT
GOWN STRL REUS W/ TWL XL LVL3 (GOWN DISPOSABLE) ×1 IMPLANT
KIT BASIN OR (CUSTOM PROCEDURE TRAY) ×1 IMPLANT
KIT TURNOVER KIT A (KITS) ×1 IMPLANT
MARKER SKIN DUAL TIP RULER LAB (MISCELLANEOUS) IMPLANT
NDL HYPO 25X1 1.5 SAFETY (NEEDLE) ×1 IMPLANT
NEEDLE HYPO 25X1 1.5 SAFETY (NEEDLE) ×1 IMPLANT
PACK GENERAL/GYN (CUSTOM PROCEDURE TRAY) ×1 IMPLANT
SPIKE FLUID TRANSFER (MISCELLANEOUS) IMPLANT
SPONGE T-LAP 4X18 ~~LOC~~+RFID (SPONGE) IMPLANT
STAPLER SKIN PROX 35W (STAPLE) IMPLANT
STRIP CLOSURE SKIN 1/2X4 (GAUZE/BANDAGES/DRESSINGS) IMPLANT
SUT MNCRL AB 4-0 PS2 18 (SUTURE) IMPLANT
SUT VIC AB 3-0 SH 18 (SUTURE) IMPLANT
SUT VICRYL 0 UR6 27IN ABS (SUTURE) IMPLANT
SWAB COLLECTION DEVICE MRSA (MISCELLANEOUS) IMPLANT
SWAB CULTURE ESWAB REG 1ML (MISCELLANEOUS) IMPLANT
SYR CONTROL 10ML LL (SYRINGE) ×1 IMPLANT
TOWEL OR 17X26 10 PK STRL BLUE (TOWEL DISPOSABLE) ×1 IMPLANT

## 2024-04-02 NOTE — H&P (Signed)
 CC: Here for surgery  Requesting provider: n/a  HPI: Lindsay Frost is an 68 y.o. female who is here for excision of chronic hypertrophic granulation tissue from an old feeding tube site.  Patient has a history of a very prolonged hospitalization for chronic gastric outlet obstruction from September 20 through July 11 2023;   She underwent exploratory laparotomy, distal gastrectomy with Roux-en-Y gastrojejunostomy along with placement of a 24 French gastric tube and a 18 French jejunostomy tube on September 20. She was taken back to the operating room on October 2 by my partner for concerns of obstruction. There was no definitive obstruction seen near her J-tube but they did redo her J-tube.   She ultimately recovered from her hospitalization and surgery.  And her feeding tubes were removed.  She had some persistent granulation tissue at the most superior left upper quadrant old feeding tube site.  We applied silver nitrate to it on multiple occasions and the granulation tissue never resolved.  CT imaging demonstrated no fistula.  She has not had any drainage from the wound.  Because we were not getting any resolution of the hypertrophic granulation tissue we discussed surgical excision of the area and patient desired that  Past Medical History:  Diagnosis Date   Acute midline thoracic back pain 02/16/2021   Acute sinusitis 09/05/2014   Allergic rhinitis 07/16/2016   Allergy 2005   Anemia 2021   Anxiety    Arthritis    B12 deficiency 01/02/2021   Back pain    arthritis   Chronic sinusitis 12/05/2020   Depression    takes cymbalta  daily   Diarrhea 02/16/2021   Duodenal stenosis    Epigastric pain 07/29/2018   Essential hypertension 05/14/2019   Gallstones    Generalized abdominal pain 02/16/2021   GERD (gastroesophageal reflux disease)    takes Omeprazole  daily   Heart murmur    Anesthesia PA listen to chest, no murmur appreciated, 03-25-24   History of hiatal hernia     History of migraine    last one about 2wks ago-takes Imitrex  prn   Joint pain    Joint swelling    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 Skeet sees her   Migraine without aura and without status migrainosus, not intractable 01/13/2015   Mixed dyslipidemia 07/25/2020   Myringotomy tube status 02/27/2021   Nausea 02/16/2021   Nocturnal leg cramps 02/20/2015   Osteoarthritis of left knee 12/02/2012   Overactive bladder 01/02/2021   Peptic ulcer disease    Recurrent acute serous otitis media of left ear 12/05/2020   Seizure-like activity (HCC) 05/12/2014   Last one was ~ 2017    Past Surgical History:  Procedure Laterality Date   BALLOON DILATION  04/03/2012   Procedure: BALLOON DILATION;  Surgeon: Belvie JONETTA Just, MD;  Location: [facility];  Service: Endoscopy;  Laterality: N/A;   BIOPSY  08/24/2019   Procedure: BIOPSY;  Surgeon: San Sandor GAILS, DO;  [facility];  Service: Gastroenterology;;  EGD and COLON   BRAVO PH STUDY N/A 08/24/2019   Procedure: BRAVO PH STUDY;  Surgeon: San Sandor GAILS, DO;  [facility];  Service: Gastroenterology;  Laterality: N/A;   CHOLECYSTECTOMY N/A 04/24/2018   Procedure: LAPAROSCOPIC CHOLECYSTECTOMY;  Surgeon: Signe Mitzie LABOR, MD;  [facility];  Service: General;  Laterality: N/A;   COLONOSCOPY  COLONOSCOPY WITH PROPOFOL  N/A 08/24/2019   Procedure: COLONOSCOPY WITH PROPOFOL ;  Surgeon: San Sandor GAILS, DO;  [facility];  Service: Gastroenterology;  Laterality: N/A;   DIAGNOSTIC LAPAROSCOPY  57yrs ago   ESOPHAGEAL MANOMETRY N/A 09/04/2015   Procedure: ESOPHAGEAL MANOMETRY (EM);  Surgeon: Belvie Just, MD;  [facility];  Service: Endoscopy;  Laterality: N/A;   ESOPHAGEAL MANOMETRY N/A 12/25/2022   Procedure: ESOPHAGEAL MANOMETRY (EM);  Surgeon: San Sandor GAILS, DO;   [facility];  Service: Gastroenterology;  Laterality: N/A;   ESOPHAGOGASTRODUODENOSCOPY  04/03/2012   Procedure: ESOPHAGOGASTRODUODENOSCOPY (EGD);  Surgeon: Belvie JONETTA Just, MD;  Location: [facility];  Service: Endoscopy;  Laterality: N/A;  EGD w/ balloon dilation   ESOPHAGOGASTRODUODENOSCOPY (EGD) WITH PROPOFOL  N/A 02/18/2014   Procedure: ESOPHAGOGASTRODUODENOSCOPY (EGD) WITH PROPOFOL ;  Surgeon: Belvie JONETTA Just, MD;  [facility];  Service: Endoscopy;  Laterality: N/A;   ESOPHAGOGASTRODUODENOSCOPY (EGD) WITH PROPOFOL  N/A 04/22/2014   Procedure: ESOPHAGOGASTRODUODENOSCOPY (EGD) WITH PROPOFOL ;  Surgeon: Belvie JONETTA Just, MD;  [facility];  Service: Endoscopy;  Laterality: N/A;   ESOPHAGOGASTRODUODENOSCOPY (EGD) WITH PROPOFOL  N/A 08/24/2019   Procedure: ESOPHAGOGASTRODUODENOSCOPY (EGD) WITH PROPOFOL ;  Surgeon: San Sandor GAILS, DO;  [facility];  Service: Gastroenterology;  Laterality: N/A;   EYE SURGERY Bilateral 2021   cataract extraction   GASTRECTOMY N/A 06/06/2023   Procedure: DISTAL GASTRECTOMY;  Surgeon: Tanda Locus, MD;  Location: [facility] OR;  Service: General;  Laterality: N/A;   GASTROSTOMY N/A 06/06/2023   Procedure: PLACEMENT OF GASTRIC AND JEJUNAL FEEDING TUBES;  Surgeon: Tanda Locus, MD;  Location: [facility] OR;  Service: General;  Laterality: N/A;   IR REPLC GASTRO/COLONIC TUBE PERCUT W/FLUORO  07/07/2023   LAPAROTOMY N/A 06/06/2023   Procedure: EXPLORATION LAPAROTOMY;  Surgeon: Tanda Locus, MD;  Location: [facility] OR;  Service: General;  Laterality: N/A;   LAPAROTOMY N/A 06/18/2023   Procedure: EXPLORATION LAPAROTOMY, INSERTION OF JEJUNAL FEEDING TUBE;  Surgeon: Teresa Lonni HERO, MD;  [facility];  Service: General;  Laterality: N/A;   LEFT HEART CATH AND CORONARY ANGIOGRAPHY N/A 04/18/2021   Procedure: LEFT HEART CATH AND CORONARY ANGIOGRAPHY;  Surgeon: Court Dorn PARAS, MD;  [facility];  Service: Cardiovascular;  Laterality:  N/A;   NASAL SEPTUM SURGERY  09/2016   PH IMPEDANCE STUDY N/A 09/04/2015   Procedure: PH IMPEDANCE STUDY;  Surgeon: Belvie Just, MD;  [facility];  Service: Endoscopy;  Laterality: N/A;   REPLACEMENT TOTAL KNEE Right 59yrs ago   TOTAL KNEE ARTHROPLASTY Left 11/30/2012   Procedure: LEFT TOTAL KNEE ARTHROPLASTY;  Surgeon: Dempsey PARAS Sensor, MD;  Location[facility];  Service: Orthopedics;  Laterality: Left;    Family History  Problem Relation Age of Onset   Arthritis Mother    Cancer Mother        breast   Heart disease Mother        died from heart problems at age 68, had CAD   Depression Mother    Heart disease Father        had CABG   Kidney disease Father    Arthritis Sister    Heart murmur Sister    Arthritis Sister    Aneurysm Sister    Arthritis Brother        serious back/neck problems   Healthy Daughter    Colon cancer Neg Hx    Esophageal cancer Neg Hx    Pancreatic cancer Neg Hx    Stomach cancer Neg Hx  Rectal cancer Neg Hx     Social:  reports that she has never smoked. She has never used smokeless tobacco. She reports that she does not drink alcohol and does not use drugs.  Allergies:  Allergies  Allergen Reactions   Naratriptan      Ineffective    Medications: I have reviewed the patient's current medications.   ROS - all of the below systems have been reviewed with the patient and positives are indicated with bold text General: chills, fever or night sweats Eyes: blurry vision or double vision ENT: epistaxis or sore throat Allergy/Immunology: itchy/watery eyes or nasal congestion Hematologic/Lymphatic: bleeding problems, blood clots or swollen lymph nodes Endocrine: temperature intolerance or unexpected weight changes Breast: new or changing breast lumps or nipple discharge Resp: cough, shortness of breath, or wheezing CV: chest pain or dyspnea on exertion GI: as per HPI GU: dysuria, trouble voiding, or hematuria MSK: joint pain or joint  stiffness Neuro: TIA or stroke symptoms Derm: pruritus and skin lesion changes Psych: anxiety and depression  PE Blood pressure (!) 169/93, pulse 66, temperature 97.8 F (36.6 C), temperature source Oral, resp. rate 18, height 5' 4 (1.626 m), weight 60.8 kg, SpO2 98%. Constitutional: NAD; conversant; no deformities Eyes: Moist conjunctiva; no lid lag; anicteric; PERRL Neck: Trachea midline; no thyromegaly Lungs: Normal respiratory effort; no tactile fremitus CV: RRR; no palpable thrills; no pitting edema GI: Abd soft, nontender, old midline incision.  Old feeding tube site.  Some mild skin irritation from tape granulation tissue in the left upper quadrant.  No cellulitis; no palpable hepatosplenomegaly MSK: Normal gait; no clubbing/cyanosis Psychiatric: Appropriate affect; alert and oriented x3 Lymphatic: No palpable cervical or axillary lymphadenopathy Skin: See above  No results found for this or any previous visit (from the past 48 hours).  No results found.  Imaging: Reviewed CT abdomen pelvis  A/P: Lindsay Frost is an 68 y.o. female with history of exploratory laparotomy, distal gastrectomy with Roux-en-Y gastrojejunostomy along with placement of a 56 French gastric tube and a 52 French jejunostomy tube on September 20.  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 IV antibiotic Discussed surgical resident involvement with case All questions asked and answered  Camellia HERO. Tanda, MD, FACS General, Bariatric, & Minimally Invasive Surgery [facility]

## 2024-04-02 NOTE — Discharge Instructions (Addendum)
 POST OP INSTRUCTIONS Always review your discharge instruction sheet given to you by the facility where your surgery was performed. IF YOU HAVE DISABILITY OR FAMILY LEAVE FORMS, YOU MUST BRING THEM TO THE OFFICE FOR PROCESSING.   DO NOT GIVE THEM TO YOUR DOCTOR.  PAIN CONTROL  First take acetaminophen  (Tylenol ) AND/or ibuprofen (Advil) to control your pain after surgery.  Follow directions on package.  Taking acetaminophen  (Tylenol ) and/or ibuprofen (Advil) regularly after surgery will help to control your pain and lower the amount of prescription pain medication you may need.  You should not take more than 3,000 mg (3 grams) of acetaminophen  (Tylenol ) in 24 hours.  You should not take ibuprofen (Advil), aleve, motrin, naprosyn or other NSAIDS if you have a history of stomach ulcers or chronic kidney disease.  A prescription for pain medication may be given to you upon discharge.  Take your pain medication as prescribed, if you still have uncontrolled pain after taking acetaminophen  (Tylenol ) or ibuprofen (Advil). Use ice packs to help control pain. If you need a refill on your pain medication, please contact your pharmacy.  They will contact our office to request authorization. Prescriptions will not be filled after 5pm or on week-ends.  HOME MEDICATIONS Take your usually prescribed medications unless otherwise directed.  DIET You should follow a light diet the first few days after arrival home.  Be sure to include lots of fluids daily. Avoid fatty, fried foods.   CONSTIPATION It is common to experience some constipation after surgery and if you are taking pain medication.  Increasing fluid intake and taking a stool softener (such as Colace) will usually help or prevent this problem from occurring.  A mild laxative (Milk of Magnesia or Miralax) should be taken according to package instructions if there are no bowel movements after 48 hours.  WOUND/INCISION CARE Most patients will experience  some swelling and bruising in the area of the incisions.  Ice packs will help.  Swelling and bruising can take several days to resolve.  Unless discharge instructions indicate otherwise, follow guidelines below  STERI-STRIPS - you may remove your outer bandages 72 hours after surgery, and you may shower at that time.  You have steri-strips (small skin tapes) in place directly over the incision.  These strips should be left on the skin for 7-10 days.   DERMABOND/SKIN GLUE - you may shower in 24 hours.  The glue will flake off over the next 2-3 weeks. Any sutures or staples will be removed at the office during your follow-up visit.  ACTIVITIES You may resume regular (light) daily activities beginning the next day--such as daily self-care, walking, climbing stairs--gradually increasing activities as tolerated.  You may have sexual intercourse when it is comfortable.  Refrain from any heavy lifting or straining until approved by your doctor. You may drive when you are no longer taking prescription pain medication, you can comfortably wear a seatbelt, and you can safely maneuver your car and apply brakes.  FOLLOW-UP You should see your doctor in the office for a follow-up appointment approximately 2-3 weeks after your surgery.  You should have been given your post-op/follow-up appointment when your surgery was scheduled.  If you did not receive a post-op/follow-up appointment, make sure that you call for this appointment within a day or two after you arrive home to insure a convenient appointment time.  OTHER INSTRUCTIONS   WHEN TO CALL YOUR DOCTOR: Fever over 101.0 Inability to urinate Continued bleeding from incision. Increased pain, redness,  or drainage from the incision. Increasing abdominal pain  The clinic staff is available to answer your questions during regular business hours.  Please don't hesitate to call and ask to speak to one of the nurses for clinical concerns.  If you have a medical  emergency, go to the nearest emergency room or call 911.  A surgeon from [facility] is always on call at the hospital. [address], [address], [place], [place]  [address] ? [address] [address], [place], [place]   [address] [phone] ? [phone] ? FAX [phone] Web site: [email or web address]

## 2024-04-02 NOTE — Op Note (Signed)
 04/02/2024  12:28 PM  PATIENT:  Lindsay Frost  68 y.o. female  PRE-OPERATIVE DIAGNOSIS:  NON HEALING ABDOMINAL WALL WOUND/old G-tube site; history of exploratory laparotomy, distal gastrectomy with Roux-en-Y reconstruction and placement of G-tube and J-tube September 2024  POST-OPERATIVE DIAGNOSIS:  same  PROCEDURE:  Procedure(s): Excision of left upper quadrant nonhealing abdominal wound/old G-tube site (5 x 3 cm skin, soft tissue, fascia)  Primary closure of abdominal wall defect/hernia (2-1/2 x 1 cm)  SURGEON:  Surgeon(s): Tanda Locus, MD  ASSISTANTS: Mae Flock MD PGY-4   ANESTHESIA:   general  EBL: min  DRAINS: none   LOCAL MEDICATIONS USED:  MARCAINE      SPECIMEN:  Source of Specimen:  Left upper quadrant abdominal skin, soft tissue  DISPOSITION OF SPECIMEN:  Discarded  COUNTS:  YES  INDICATION FOR PROCEDURE: Patient underwent exploratory laparotomy, distal gastrectomy with Roux-en-Y reconstruction with placement 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  She ultimately recovered quite well.  Her feeding tube and gastrostomy tube were removed.  But she developed granulation tissue at the left upper quadrant G-tube site.  It was initially managed with silver nitrate but the patient still had persistent granulation tissue.  There is no drainage.  A CT scan was performed which demonstrated no fistula to stomach or small bowel.  Because it was persistent and not healing patient desired definitive surgical management.  Risk and benefits were discussed and separately documented  PROCEDURE: After obtaining informed consent the patient was taken to the OR 1 at [facility] placed upon on the operating room table.  General LMA anesthesia was established.  Sequential compression devices were placed.  Abdomen was prepped and draped in usual standard surgical fashion with ChloraPrep and Betadine directly over the wound.  It was in  the left upper quadrant.  Surgical timeout was performed.  She received IV antibiotics prior to skin incision.  An elliptical incision was made incorporating normal skin and soft tissue around the persistent granulation tissue from her old feeding tube site.  Subcutaneous tissue was divided with electrocautery.  We identified the track going down to the fascia and circumferentially mobilized and dissected out with aid of a tonsil and electrocautery.  The tract was completely excised with electrocautery.  After doing so this did leave a defect in the abdominal wall of about 2-1/2 x 1 cm.  There is no adhesions underneath this.  A finger sweep was performed which demonstrated no adhesions to the anterior abdominal wall.  The defect was closed primarily with 4 interrupted 0 Vicryl sutures on a UR 6 needle.  Local was demonstrated.  Soft tissue was irrigated.  Deep subcutaneous tissue was closed with multiple interrupted 3-0 Vicryl sutures.  Skin was closed with a running 4-0 Monocryl in a subcuticular fashion followed by the application of Steri-Strips and a honeycomb dressing.  All needle, instrument, and sponge counts were correct x 2.  Patient was taken to the recovery room after emerging from general LMA anesthesia  I was personally present and scrubbed during the entire procedure.  PLAN OF CARE: Discharge to home after PACU  PATIENT DISPOSITION:  PACU - hemodynamically stable.   Delay start of Pharmacological VTE agent (>24hrs) due to surgical blood loss or risk of bleeding:  not applicable  Locus HERO. Tanda, MD, FACS General, Bariatric, & Minimally Invasive Surgery [facility]

## 2024-04-02 NOTE — Transfer of Care (Signed)
 Immediate Anesthesia Transfer of Care Note  Patient: Lindsay Frost  Procedure(s) Performed: EXCISION, LESION, TORSO (Abdomen)  Patient Location: PACU  Anesthesia Type:General  Level of Consciousness: awake and alert   Airway & Oxygen Therapy: Patient Spontanous Breathing and Patient connected to face mask oxygen  Post-op Assessment: Report given to RN, Post -op Vital signs reviewed and stable, and BP 146/74  Post vital signs: Reviewed and stable  Last Vitals:  Vitals Value Taken Time  BP    Temp    Pulse 87 04/02/24 12:19  Resp 9 04/02/24 12:19  SpO2 100 % 04/02/24 12:19  Vitals shown include unfiled device data.  Last Pain:  Vitals:   04/02/24 0754  TempSrc: Oral  PainSc:          Complications: No notable events documented.

## 2024-04-02 NOTE — Anesthesia Postprocedure Evaluation (Signed)
 Anesthesia Post Note  Patient: Lindsay Frost  Procedure(s) Performed: EXCISION, LESION, TORSO (Abdomen)     Patient location during evaluation: PACU Anesthesia Type: General Level of consciousness: awake and alert, oriented and patient cooperative Pain management: pain level controlled Vital Signs Assessment: post-procedure vital signs reviewed and stable Respiratory status: spontaneous breathing, nonlabored ventilation and respiratory function stable Cardiovascular status: blood pressure returned to baseline and stable Postop Assessment: no apparent nausea or vomiting Anesthetic complications: no   No notable events documented.  Last Vitals:  Vitals:   04/02/24 1300 04/02/24 1315  BP: (!) 159/75 (!) 164/96  Pulse: 68   Resp: 12   Temp:    SpO2: 98%     Last Pain:  Vitals:   04/02/24 1300  TempSrc:   PainSc: 0-No pain                 [name]

## 2024-04-02 NOTE — Anesthesia Procedure Notes (Signed)
 Procedure Name: LMA Insertion Date/Time: 04/02/2024 11:19 AM  Performed by: Dasie Nena PARAS, CRNAPre-anesthesia Checklist: Patient identified, Emergency Drugs available, Suction available, Patient being monitored and Timeout performed Patient Re-evaluated:Patient Re-evaluated prior to induction Oxygen Delivery Method: Circle system utilized Preoxygenation: Pre-oxygenation with 100% oxygen Induction Type: IV induction Ventilation: Mask ventilation without difficulty LMA: LMA inserted LMA Size: 4.0 Number of attempts: 1 Placement Confirmation: positive ETCO2 and breath sounds checked- equal and bilateral Tube secured with: Tape Dental Injury: Teeth and Oropharynx as per pre-operative assessment

## 2024-04-03 ENCOUNTER — Encounter (HOSPITAL_COMMUNITY): Payer: Self-pay | Admitting: General Surgery

## 2024-04-22 ENCOUNTER — Telehealth: Payer: Self-pay | Admitting: Gastroenterology

## 2024-04-22 MED ORDER — ESOMEPRAZOLE MAGNESIUM 40 MG PO CPDR: 40.0000 mg | DELAYED_RELEASE_CAPSULE | Freq: Two times a day (BID) | ORAL | 0 refills | Status: DC

## 2024-04-22 NOTE — Telephone Encounter (Signed)
 Called and spoke with patient. Patient states that she had been doing well since her surgery in September 2024 with Dr. Tanda, after surgery she did not have to take any antacids or H2 Blockers. Recently patient has been experiencing burning in her chest since she is eating more of a normal diet. I asked patient if she had been following antireflux diet and she says that she tries to avid things that will exacerbate her symptoms but she does eat some spicy once in a while. Patient is not having any regurgitation. She has been using Mylanta and it helps. Dr. Tanda advised patient to reach out  to our office to see if you would like to prescribe anything for her. Please advise, thanks.

## 2024-04-22 NOTE — Telephone Encounter (Signed)
 Inbound call from patient requesting to discuss medication for GERD. States she has been having complications. Please advise, thank you

## 2024-04-22 NOTE — Telephone Encounter (Signed)
 RX for Nexium  40 mg BID for 4 weeks has been sent to pharmacy. Will send RX for once a day if patient sees improvement after 4 weeks. Patient notified via MyChart.

## 2024-05-03 ENCOUNTER — Encounter: Payer: Self-pay | Admitting: Neurology

## 2024-05-04 ENCOUNTER — Encounter: Payer: Self-pay | Admitting: Neurology

## 2024-05-20 ENCOUNTER — Other Ambulatory Visit: Payer: Self-pay | Admitting: Gastroenterology

## 2024-05-20 DIAGNOSIS — H6992 Unspecified Eustachian tube disorder, left ear: Secondary | ICD-10-CM | POA: Diagnosis not present

## 2024-05-20 DIAGNOSIS — H90A21 Sensorineural hearing loss, unilateral, right ear, with restricted hearing on the contralateral side: Secondary | ICD-10-CM | POA: Diagnosis not present

## 2024-05-20 DIAGNOSIS — H90A32 Mixed conductive and sensorineural hearing loss, unilateral, left ear with restricted hearing on the contralateral side: Secondary | ICD-10-CM | POA: Diagnosis not present

## 2024-05-21 ENCOUNTER — Telehealth: Admitting: Physician Assistant

## 2024-05-21 DIAGNOSIS — J019 Acute sinusitis, unspecified: Secondary | ICD-10-CM

## 2024-05-21 DIAGNOSIS — B9689 Other specified bacterial agents as the cause of diseases classified elsewhere: Secondary | ICD-10-CM | POA: Diagnosis not present

## 2024-05-21 MED ORDER — AMOXICILLIN-POT CLAVULANATE 875-125 MG PO TABS: 1.0000 | ORAL_TABLET | Freq: Two times a day (BID) | ORAL | 0 refills | Status: DC

## 2024-05-21 NOTE — Progress Notes (Signed)

## 2024-05-21 NOTE — Telephone Encounter (Signed)
 Spoke with patient regarding refill request for Nexium  40mg  one capsule twice daily.  Patient was advised on 04-22-24 that after 4 weeks she could reduce Nexium  to 40mg  once daily.  Patient states she is doing well and is OK with reducing to daily dosing.  Rx sent to pharmacy.

## 2024-05-22 ENCOUNTER — Other Ambulatory Visit: Payer: Self-pay | Admitting: Neurology

## 2024-05-31 ENCOUNTER — Other Ambulatory Visit: Payer: Self-pay | Admitting: Family

## 2024-06-09 ENCOUNTER — Ambulatory Visit: Admitting: Family

## 2024-06-09 VITALS — BP 133/67 | HR 73 | Temp 97.7°F | Resp 16 | Ht 64.0 in | Wt 141.0 lb

## 2024-06-09 DIAGNOSIS — R059 Cough, unspecified: Secondary | ICD-10-CM

## 2024-06-09 DIAGNOSIS — F418 Other specified anxiety disorders: Secondary | ICD-10-CM

## 2024-06-09 DIAGNOSIS — K2100 Gastro-esophageal reflux disease with esophagitis, without bleeding: Secondary | ICD-10-CM | POA: Diagnosis not present

## 2024-06-09 DIAGNOSIS — R634 Abnormal weight loss: Secondary | ICD-10-CM | POA: Diagnosis not present

## 2024-06-09 DIAGNOSIS — R09A2 Foreign body sensation, throat: Secondary | ICD-10-CM | POA: Diagnosis not present

## 2024-06-09 DIAGNOSIS — G40209 Localization-related (focal) (partial) symptomatic epilepsy and epileptic syndromes with complex partial seizures, not intractable, without status epilepticus: Secondary | ICD-10-CM

## 2024-06-09 MED ORDER — ESCITALOPRAM OXALATE 10 MG PO TABS: ORAL_TABLET | ORAL | 0 refills | Status: DC

## 2024-06-09 MED ORDER — ESOMEPRAZOLE MAGNESIUM 40 MG PO CPDR: DELAYED_RELEASE_CAPSULE | ORAL | 0 refills | Status: AC

## 2024-06-09 NOTE — Assessment & Plan Note (Signed)
 Stop remeron  as weight is improved. Depression remains uncontrolled. D/c remeron , continue buspar , add lexapro  which she has tolerated in the past.

## 2024-06-09 NOTE — Assessment & Plan Note (Signed)
 No recent seizures, continues keppra .  Management per neurology.

## 2024-06-09 NOTE — Progress Notes (Unsigned)
 Subjective:     Patient ID: Lindsay Frost, female    DOB: 18-Oct-1955, 68 y.o.   MRN: [id number]  Chief Complaint  Patient presents with   Depression    Here for follow up   Cough    Patient complains of cough on and off for about 3 weeks    HPI  Discussed the use of AI scribe software for clinical note transcription with the patient, who gave verbal consent to proceed.  History of Present Illness  Lindsay Frost is a 68 year old female who presents with a persistent cough.  She has experienced a cough for one month with white discharge and a sensation of throat fullness. Nyquil and a prescribed antibiotic have not relieved her symptoms. She takes Nexium  and has heartburn. Her mood is somewhat depressed, and she takes Buspar  and Remeron . She wishes to change from Remeron  due to weight gain. She previously used Zoloft  and Lexapro . She takes Keppra  for seizure prevention, with no seizures in seven to eight years, but continues due to scan findings.     Health Maintenance Due  Topic Date Due   Zoster Vaccines- Shingrix (1 of 2) Never done   DTaP/Tdap/Td (2 - Td or Tdap) 08/06/2023   COVID-19 Vaccine (1 - 2024-25 season) Never done    Past Medical History:  Diagnosis Date   Acute midline thoracic back pain 02/16/2021   Acute sinusitis 09/05/2014   Allergic rhinitis 07/16/2016   Allergy 2005   Anemia 2021   Anxiety    Arthritis    B12 deficiency 01/02/2021   Back pain    arthritis   Chronic sinusitis 12/05/2020   Depression    takes cymbalta  daily   Diarrhea 02/16/2021   Duodenal stenosis    Epigastric pain 07/29/2018   Essential hypertension 05/14/2019   Gallstones    Generalized abdominal pain 02/16/2021   GERD (gastroesophageal reflux disease)    takes Omeprazole  daily   Heart murmur    Anesthesia PA listen to chest, no murmur appreciated, 03-25-24   History of hiatal hernia    History of migraine    last one about 2wks ago-takes Imitrex  prn   Joint pain     Joint swelling    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 Skeet sees her   Migraine without aura and without status migrainosus, not intractable 01/13/2015   Mixed dyslipidemia 07/25/2020   Myringotomy tube status 02/27/2021   Nausea 02/16/2021   Nocturnal leg cramps 02/20/2015   Osteoarthritis of left knee 12/02/2012   Overactive bladder 01/02/2021   Peptic ulcer disease    Recurrent acute serous otitis media of left ear 12/05/2020   Seizure-like activity (HCC) 05/12/2014   Last one was ~ 2017    Past Surgical History:  Procedure Laterality Date   BALLOON DILATION  04/03/2012   Procedure: BALLOON DILATION;  Surgeon: Belvie JONETTA Just, MD;  Location: [facility];  Service: Endoscopy;  Laterality: N/A;   BIOPSY  08/24/2019   Procedure: BIOPSY;  Surgeon: San Sandor GAILS, DO;  Location: [facility];  Service: Gastroenterology;;  EGD and COLON   BRAVO PH STUDY N/A 08/24/2019   Procedure: BRAVO PH STUDY;  Surgeon: San Sandor GAILS, DO;  Location: [facility];  Service: Gastroenterology;  Laterality: N/A;   CHOLECYSTECTOMY N/A 04/24/2018   Procedure: LAPAROSCOPIC CHOLECYSTECTOMY;  Surgeon: Signe Mitzie LABOR, MD;  Location: [facility];  Service: General;  Laterality: N/A;   COLONOSCOPY     COLONOSCOPY WITH PROPOFOL  N/A 08/24/2019   Procedure: COLONOSCOPY WITH PROPOFOL ;  Surgeon: San Sandor GAILS, DO;  Location: [facility];  Service: Gastroenterology;  Laterality: N/A;   DIAGNOSTIC LAPAROSCOPY  26yrs ago   ESOPHAGEAL MANOMETRY N/A 09/04/2015   Procedure: ESOPHAGEAL MANOMETRY (EM);  Surgeon: Belvie Just, MD;  Location: [facility];  Service: Endoscopy;  Laterality: N/A;   ESOPHAGEAL MANOMETRY N/A 12/25/2022   Procedure: ESOPHAGEAL MANOMETRY (EM);  Surgeon: San Sandor GAILS, DO;  Location: [facility];  Service: Gastroenterology;  Laterality: N/A;    ESOPHAGOGASTRODUODENOSCOPY  04/03/2012   Procedure: ESOPHAGOGASTRODUODENOSCOPY (EGD);  Surgeon: Belvie JONETTA Just, MD;  Location: [facility];  Service: Endoscopy;  Laterality: N/A;  EGD w/ balloon dilation   ESOPHAGOGASTRODUODENOSCOPY (EGD) WITH PROPOFOL  N/A 02/18/2014   Procedure: ESOPHAGOGASTRODUODENOSCOPY (EGD) WITH PROPOFOL ;  Surgeon: Belvie JONETTA Just, MD;  Location: [facility];  Service: Endoscopy;  Laterality: N/A;   ESOPHAGOGASTRODUODENOSCOPY (EGD) WITH PROPOFOL  N/A 04/22/2014   Procedure: ESOPHAGOGASTRODUODENOSCOPY (EGD) WITH PROPOFOL ;  Surgeon: Belvie JONETTA Just, MD;  Location: [facility];  Service: Endoscopy;  Laterality: N/A;   ESOPHAGOGASTRODUODENOSCOPY (EGD) WITH PROPOFOL  N/A 08/24/2019   Procedure: ESOPHAGOGASTRODUODENOSCOPY (EGD) WITH PROPOFOL ;  Surgeon: San Sandor GAILS, DO;  Location: [facility];  Service: Gastroenterology;  Laterality: N/A;   EYE SURGERY Bilateral 2021   cataract extraction   GASTRECTOMY N/A 06/06/2023   Procedure: DISTAL GASTRECTOMY;  Surgeon: Tanda Locus, MD;  Location: [facility];  Service: General;  Laterality: N/A;   GASTROSTOMY N/A 06/06/2023   Procedure: PLACEMENT OF GASTRIC AND JEJUNAL FEEDING TUBES;  Surgeon: Tanda Locus, MD;  Location: [facility] OR;  Service: General;  Laterality: N/A;   IR REPLC GASTRO/COLONIC TUBE PERCUT W/FLUORO  07/07/2023   LAPAROTOMY N/A 06/06/2023   Procedure: EXPLORATION LAPAROTOMY;  Surgeon: Tanda Locus, MD;  Location: [facility] OR;  Service: General;  Laterality: N/A;   LAPAROTOMY N/A 06/18/2023   Procedure: EXPLORATION LAPAROTOMY, INSERTION OF JEJUNAL FEEDING TUBE;  Surgeon: Teresa Lonni HERO, MD;  Location: [facility];  Service: General;  Laterality: N/A;   LEFT HEART CATH AND CORONARY ANGIOGRAPHY N/A 04/18/2021   Procedure: LEFT HEART CATH AND CORONARY ANGIOGRAPHY;  Surgeon: Court Dorn PARAS, MD;  Location: [facility];  Service: Cardiovascular;  Laterality: N/A;   LESION REMOVAL N/A 04/02/2024   Procedure: EXCISION, LESION,  TORSO;  Surgeon: Tanda Locus, MD;  Location: [facility];  Service: General;  Laterality: N/A;  excision of chronic abdominal granulation tissue   NASAL SEPTUM SURGERY  09/2016   PH IMPEDANCE STUDY N/A 09/04/2015   Procedure: PH IMPEDANCE STUDY;  Surgeon: Belvie Just, MD;  Location: [facility];  Service: Endoscopy;  Laterality: N/A;   REPLACEMENT TOTAL KNEE Right 62yrs ago   TOTAL KNEE ARTHROPLASTY Left 11/30/2012   Procedure: LEFT TOTAL KNEE ARTHROPLASTY;  Surgeon: Dempsey PARAS Sensor, MD;  Location: [facility];  Service: Orthopedics;  Laterality: Left;    Family History  Problem Relation Age of Onset   Arthritis Mother    Cancer Mother        breast   Heart disease Mother        died from heart problems at age 29, had CAD   Depression Mother    Heart disease Father        had CABG   Kidney disease Father    Arthritis Sister    Heart murmur Sister    Arthritis Sister    Aneurysm Sister  Arthritis Brother        serious back/neck problems   Healthy Daughter    Colon cancer Neg Hx    Esophageal cancer Neg Hx    Pancreatic cancer Neg Hx    Stomach cancer Neg Hx    Rectal cancer Neg Hx     Social History   Socioeconomic History   Marital status: Widowed    Spouse name: Not on file   Number of children: 1   Years of education: Not on file   Highest education level: 12th grade  Occupational History   Occupation: Retired  Tobacco Use   Smoking status: Never   Smokeless tobacco: Never  Vaping Use   Vaping status: Never Used  Substance and Sexual Activity   Alcohol use: No   Drug use: No   Sexual activity: Not Currently    Birth control/protection: Post-menopausal  Other Topics Concern   Not on file  Social History Narrative   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   Single   1 daughter- lives in [place]   2 dogs   Enjoys reading, watching television   Pt is right handed   Drinks coffee once a week, green tea daily, soda sometimes     Social Drivers of Corporate investment banker Strain: Medium Risk (06/07/2024)   Overall Financial Resource Strain (CARDIA)    Difficulty of Paying Living Expenses: Somewhat hard  Food Insecurity: No Food Insecurity (06/07/2024)   Hunger Vital Sign    Worried About Running Out of Food in the Last Year: Never true    Ran Out of Food in the Last Year: Never true  Transportation Needs: No Transportation Needs (06/07/2024)   PRAPARE - Administrator, Civil Service (Medical): No    Lack of Transportation (Non-Medical): No  Physical Activity: Insufficiently Active (06/07/2024)   Exercise Vital Sign    Days of Exercise per Week: 2 days    Minutes of Exercise per Session: 20 min  Stress: Stress Concern Present (06/07/2024)   [employer] of Occupational Health - Occupational Stress Questionnaire    Feeling of Stress: To some extent  Social Connections: Socially Isolated (06/07/2024)   Social Connection and Isolation Panel    Frequency of Communication with Friends and Family: Once a week    Frequency of Social Gatherings with Friends and Family: Once a week    Attends Religious Services: Never    Database administrator or Organizations: No    Attends Engineer, structural: Not on file    Marital Status: Divorced  Intimate Partner Violence: Not At Risk (11/18/2023)   Humiliation, Afraid, Rape, and Kick questionnaire    Fear of Current or Ex-Partner: No    Emotionally Abused: No    Physically Abused: No    Sexually Abused: No    Outpatient Medications Prior to Visit  Medication Sig Dispense Refill   amoxicillin -clavulanate (AUGMENTIN ) 875-125 MG tablet Take 1 tablet by mouth 2 (two) times daily. 20 tablet 0   busPIRone  (BUSPAR ) 15 MG tablet TAKE 1 TABLET(15 MG) BY MOUTH TWICE DAILY (Patient taking differently: Take 15 mg by mouth daily.) 180 tablet 1   levETIRAcetam  (KEPPRA ) 500 MG tablet TAKE 2 TABLETS(1000 MG) BY MOUTH TWICE DAILY 120 tablet 5   MYRBETRIQ  25 MG  TB24 tablet TAKE 1 TABLET(25 MG) BY MOUTH DAILY 90 tablet 1   nitroGLYCERIN  (NITROSTAT ) 0.4 MG SL tablet Place 0.4 mg under the tongue every  5 (five) minutes as needed for chest pain.     RESTASIS  0.05 % ophthalmic emulsion Place 1 drop into both eyes 2 (two) times daily.     traMADol  (ULTRAM ) 50 MG tablet Take 1 tablet (50 mg total) by mouth every 6 (six) hours as needed for moderate pain (pain score 4-6) or severe pain (pain score 7-10). 15 tablet 0   esomeprazole  (NEXIUM ) 40 MG capsule Take 1 capsule (40 mg total) by mouth daily. 30 capsule 3   mirtazapine  (REMERON ) 15 MG tablet Take 1 tablet (15 mg total) by mouth at bedtime. Needs appt 30 tablet 0   No facility-administered medications prior to visit.    Allergies  Allergen Reactions   Naratriptan      Ineffective    ROS    See HPI Objective:    Physical Exam Constitutional:      General: She is not in acute distress.    Appearance: Normal appearance. She is well-developed.  HENT:     Head: Normocephalic and atraumatic.     Right Ear: External ear normal.     Left Ear: External ear normal.  Eyes:     General: No scleral icterus. Neck:     Thyroid : No thyromegaly.  Cardiovascular:     Rate and Rhythm: Normal rate and regular rhythm.     Heart sounds: Normal heart sounds. No murmur heard. Pulmonary:     Effort: Pulmonary effort is normal. No respiratory distress.     Breath sounds: Normal breath sounds. No wheezing.  Musculoskeletal:     Cervical back: Neck supple.  Skin:    General: Skin is warm and dry.  Neurological:     Mental Status: She is alert and oriented to person, place, and time.  Psychiatric:        Mood and Affect: Mood normal.        Behavior: Behavior normal.        Thought Content: Thought content normal.        Judgment: Judgment normal.      BP 133/67 (BP Location: Right Arm, Patient Position: Sitting, Cuff Size: Small)   Pulse 73   Temp 97.7 F (36.5 C) (Oral)   Resp 16   Ht 5' 4  (1.626 m)   Wt 141 lb (64 kg)   SpO2 99%   BMI 24.20 kg/m  Wt Readings from Last 3 Encounters:  06/09/24 141 lb (64 kg)  04/02/24 134 lb (60.8 kg)  03/25/24 134 lb (60.8 kg)       Assessment & Plan:   Problem List Items Addressed This Visit       Unprioritized   Weight loss   Weight is improving.        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  Management per neurology.       GERD (gastroesophageal reflux disease) - Primary   Suspect gerd is cause for globus sensation and cough. Increase nexium  to bid x 1 month then once daily       Relevant Medications   esomeprazole  (NEXIUM ) 40 MG capsule   Depression with anxiety   Stop remeron  as weight is improved. Depression remains uncontrolled. D/c remeron , continue buspar , add lexapro  which she has tolerated in the past.       Relevant Medications   escitalopram  (LEXAPRO ) 10 MG tablet   Other Visit Diagnoses       Globus sensation       Relevant  Medications   esomeprazole  (NEXIUM ) 40 MG capsule     Cough, unspecified type       Relevant Medications   esomeprazole  (NEXIUM ) 40 MG capsule       I have discontinued Lindsay H. Pulsifer's mirtazapine . I have also changed her esomeprazole . Additionally, I am having her start on escitalopram . Lastly, I am having her maintain her nitroGLYCERIN , Restasis , busPIRone , Myrbetriq , traMADol , amoxicillin -clavulanate, and levETIRAcetam .  Meds ordered this encounter  Medications   esomeprazole  (NEXIUM ) 40 MG capsule    Sig: Take 1 tablet by mouth twice daily for 1 month, then return to once daily.    Dispense:  120 capsule    Refill:  0    Supervising Provider:   DOMENICA BLACKBIRD A [[id number]]   escitalopram  (LEXAPRO ) 10 MG tablet    Sig: Take 1/2 tablet by mouth once daily for 1 week, then increase to a full tab once daily on week two.    Dispense:  90 tablet    Refill:  0    Supervising Provider:   DOMENICA BLACKBIRD A  [[id number]]

## 2024-06-09 NOTE — Assessment & Plan Note (Signed)
Weight is improving

## 2024-06-10 NOTE — Assessment & Plan Note (Signed)
 Suspect gerd is cause for globus sensation and cough. Increase nexium  to bid x 1 month then once daily

## 2024-06-10 NOTE — Patient Instructions (Signed)
 VISIT SUMMARY:  You visited us  today because of a persistent cough that has lasted for a month. We discussed your ongoing issues with GERD, your mood, and your seizure disorder. We also talked about some general health maintenance items.  YOUR PLAN:  GASTROESOPHAGEAL REFLUX DISEASE (GERD) WITH CHRONIC COUGH AND THROAT CLEARING: Your chronic cough and throat clearing are likely due to GERD. -Increase Nexium  to twice daily for one month, then reduce to once daily. -Avoid eating late at night before lying down. -Elevate the head of your bed by six inches.  MAJOR DEPRESSIVE DISORDER: Your mood remains somewhat depressed, and Remeron  is causing weight gain. -Discontinue Remeron . -Start taking Lexapro , beginning with half a tablet at bedtime for one week, then increase to a full tablet if tolerated.  SEIZURE DISORDER: You have been seizure-free for 7-8 years on Keppra , but recent scans showed seizure activity. -Continue taking Keppra  as managed by your neurologist.  GENERAL HEALTH MAINTENANCE: You are overdue for a mammogram and a COVID booster is recommended. -Please schedule a mammogram. -Get a COVID booster this fall.

## 2024-07-07 ENCOUNTER — Telehealth (INDEPENDENT_AMBULATORY_CARE_PROVIDER_SITE_OTHER): Admitting: Family

## 2024-07-07 ENCOUNTER — Ambulatory Visit: Admitting: Family

## 2024-07-07 VITALS — Wt 140.0 lb

## 2024-07-07 DIAGNOSIS — K2100 Gastro-esophageal reflux disease with esophagitis, without bleeding: Secondary | ICD-10-CM | POA: Diagnosis not present

## 2024-07-07 DIAGNOSIS — R634 Abnormal weight loss: Secondary | ICD-10-CM

## 2024-07-07 DIAGNOSIS — F418 Other specified anxiety disorders: Secondary | ICD-10-CM

## 2024-07-07 NOTE — Assessment & Plan Note (Signed)
 This has resolved and has been stable with the removal of remeron .

## 2024-07-07 NOTE — Assessment & Plan Note (Signed)
 Globus sensation and gerd sx's have resolved. Advised pt to continue nexium  bid for 1 more month then decrease to once daily.

## 2024-07-07 NOTE — Assessment & Plan Note (Signed)
 Improved with addition of lexapro . Continue lexapro  along with Buspar .

## 2024-07-07 NOTE — Patient Instructions (Addendum)
 VISIT SUMMARY:  Today, you came in for a follow-up visit to discuss your depression and gastroesophageal reflux disease (GERD). Your mood has improved with the addition of Lexapro  to your medication regimen, and your GERD symptoms have lessened with an increased dosage of Nexium .  YOUR PLAN:  DEPRESSIVE DISORDER: Your mood has improved with the addition of Lexapro  to your current medications Buspar . -Continue taking Lexapro , Buspar , and Remeron  as prescribed.  GASTROESOPHAGEAL REFLUX DISEASE (GERD): Your GERD symptoms have improved with the increased dosage of Nexium . -Continue taking Nexium  twice daily for one month. -If your symptoms remain controlled after one month, reduce Nexium  to once daily.

## 2024-07-07 NOTE — Progress Notes (Signed)
 MyChart Video Visit    Virtual Visit via Video Note    Patient location: Home. Patient and provider in visit Provider location: Office  I discussed the limitations of evaluation and management by telemedicine and the availability of in person appointments. The patient expressed understanding and agreed to proceed.  Visit Date: 07/07/2024  Today's healthcare provider: Eleanor GORMAN Ponto, NP     Subjective:    Patient ID: Lindsay Frost, female    DOB: 1955-12-05, 68 y.o.   MRN: [id number]  No chief complaint on file.   HPI ROSENDA Frost is a 68 year old female who presents for follow-up of depression and gastroesophageal reflux disease (GERD).  She experiences depression and was previously on Buspar  and Remeron . Her appetite improved with Remeron , but her mood remained low, leading to the addition of Lexapro . She now feels 'pretty good' with an improved mood on Lexapro .  She had a sensation of something in her throat and a cough, prompting an increase in Nexium  dosage from once daily to twice daily. These symptoms have improved with the increased dosage.   Past Medical History:  Diagnosis Date   Acute midline thoracic back pain 02/16/2021   Acute sinusitis 09/05/2014   Allergic rhinitis 07/16/2016   Allergy 2005   Anemia 2021   Anxiety    Arthritis    B12 deficiency 01/02/2021   Back pain    arthritis   Chronic sinusitis 12/05/2020   Depression    takes cymbalta  daily   Diarrhea 02/16/2021   Duodenal stenosis    Epigastric pain 07/29/2018   Essential hypertension 05/14/2019   Gallstones    Generalized abdominal pain 02/16/2021   GERD (gastroesophageal reflux disease)    takes Omeprazole  daily   Heart murmur    Anesthesia PA listen to chest, no murmur appreciated, 03-25-24   History of hiatal hernia    History of migraine    last one about 2wks ago-takes Imitrex  prn   Joint pain    Joint swelling    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 Skeet sees her   Migraine without aura and without status migrainosus, not intractable 01/13/2015   Mixed dyslipidemia 07/25/2020   Myringotomy tube status 02/27/2021   Nausea 02/16/2021   Nocturnal leg cramps 02/20/2015   Osteoarthritis of left knee 12/02/2012   Overactive bladder 01/02/2021   Peptic ulcer disease    Recurrent acute serous otitis media of left ear 12/05/2020   Seizure-like activity (HCC) 05/12/2014   Last one was ~ 2017    Past Surgical History:  Procedure Laterality Date   BALLOON DILATION  04/03/2012   Procedure: BALLOON DILATION;  Surgeon: Belvie JONETTA Just, MD;  Location: [facility];  Service: Endoscopy;  Laterality: N/A;   BIOPSY  08/24/2019   Procedure: BIOPSY;  Surgeon: San Sandor GAILS, DO;  Location: [facility];  Service: Gastroenterology;;  EGD and COLON   BRAVO PH STUDY N/A 08/24/2019   Procedure: BRAVO PH STUDY;  Surgeon: San Sandor GAILS, DO;  Location: [facility];  Service: Gastroenterology;  Laterality: N/A;   CHOLECYSTECTOMY N/A 04/24/2018   Procedure: LAPAROSCOPIC CHOLECYSTECTOMY;  Surgeon: Signe Mitzie LABOR, MD;  Location: [facility];  Service: General;  Laterality: N/A;   COLONOSCOPY     COLONOSCOPY WITH PROPOFOL  N/A 08/24/2019   Procedure: COLONOSCOPY WITH PROPOFOL ;  Surgeon: San Sandor GAILS, DO;  Location: [facility];  Service: Gastroenterology;  Laterality: N/A;   DIAGNOSTIC LAPAROSCOPY  39yrs ago   ESOPHAGEAL MANOMETRY N/A 09/04/2015   Procedure: ESOPHAGEAL MANOMETRY (EM);  Surgeon: Belvie Just, MD;  Location: [facility];  Service: Endoscopy;  Laterality: N/A;   ESOPHAGEAL MANOMETRY N/A 12/25/2022   Procedure: ESOPHAGEAL MANOMETRY (EM);  Surgeon: San Sandor GAILS, DO;  Location: [facility];  Service: Gastroenterology;  Laterality: N/A;   ESOPHAGOGASTRODUODENOSCOPY  04/03/2012   Procedure: ESOPHAGOGASTRODUODENOSCOPY  (EGD);  Surgeon: Belvie JONETTA Just, MD;  Location: [facility];  Service: Endoscopy;  Laterality: N/A;  EGD w/ balloon dilation   ESOPHAGOGASTRODUODENOSCOPY (EGD) WITH PROPOFOL  N/A 02/18/2014   Procedure: ESOPHAGOGASTRODUODENOSCOPY (EGD) WITH PROPOFOL ;  Surgeon: Belvie JONETTA Just, MD;  Location: [facility];  Service: Endoscopy;  Laterality: N/A;   ESOPHAGOGASTRODUODENOSCOPY (EGD) WITH PROPOFOL  N/A 04/22/2014   Procedure: ESOPHAGOGASTRODUODENOSCOPY (EGD) WITH PROPOFOL ;  Surgeon: Belvie JONETTA Just, MD;  Location: [facility];  Service: Endoscopy;  Laterality: N/A;   ESOPHAGOGASTRODUODENOSCOPY (EGD) WITH PROPOFOL  N/A 08/24/2019   Procedure: ESOPHAGOGASTRODUODENOSCOPY (EGD) WITH PROPOFOL ;  Surgeon: San Sandor GAILS, DO;  Location: [facility];  Service: Gastroenterology;  Laterality: N/A;   EYE SURGERY Bilateral 2021   cataract extraction   GASTRECTOMY N/A 06/06/2023   Procedure: DISTAL GASTRECTOMY;  Surgeon: Tanda Locus, MD;  Location: [name] OR;  Service: General;  Laterality: N/A;   GASTROSTOMY N/A 06/06/2023   Procedure: PLACEMENT OF GASTRIC AND JEJUNAL FEEDING TUBES;  Surgeon: Tanda Locus, MD;  Location[facility];  Service: General;  Laterality: N/A;   IR REPLC GASTRO/COLONIC TUBE PERCUT W/FLUORO  07/07/2023   LAPAROTOMY N/A 06/06/2023   Procedure: EXPLORATION LAPAROTOMY;  Surgeon: Tanda Locus, MD;  Location: [facility] OR;  Service: General;  Laterality: N/A;   LAPAROTOMY N/A 06/18/2023   Procedure: EXPLORATION LAPAROTOMY, INSERTION OF JEJUNAL FEEDING TUBE;  Surgeon: Teresa Lonni HERO, MD;  Location: [facility];  Service: General;  Laterality: N/A;   LEFT HEART CATH AND CORONARY ANGIOGRAPHY N/A 04/18/2021   Procedure: LEFT HEART CATH AND CORONARY ANGIOGRAPHY;  Surgeon: Court Dorn PARAS, MD;  Location: [facility];  Service: Cardiovascular;  Laterality: N/A;   LESION REMOVAL N/A 04/02/2024   Procedure: EXCISION, LESION, TORSO;  Surgeon: Tanda Locus, MD;  Location: [facility];  Service: General;   Laterality: N/A;  excision of chronic abdominal granulation tissue   NASAL SEPTUM SURGERY  09/2016   PH IMPEDANCE STUDY N/A 09/04/2015   Procedure: PH IMPEDANCE STUDY;  Surgeon: Belvie Just, MD;  Location: [facility];  Service: Endoscopy;  Laterality: N/A;   REPLACEMENT TOTAL KNEE Right 53yrs ago   TOTAL KNEE ARTHROPLASTY Left 11/30/2012   Procedure: LEFT TOTAL KNEE ARTHROPLASTY;  Surgeon: Dempsey PARAS Sensor, MD;  Location: [facility];  Service: Orthopedics;  Laterality: Left;    Family History  Problem Relation Age of Onset   Arthritis Mother    Cancer Mother        breast   Heart disease Mother        died from heart problems at age 27, had CAD   Depression Mother    Heart disease Father        had CABG   Kidney disease Father    Arthritis Sister    Heart murmur Sister    Arthritis Sister    Aneurysm Sister    Arthritis Brother        serious back/neck problems   Healthy Daughter    Colon cancer Neg Hx    Esophageal cancer Neg Hx  Pancreatic cancer Neg Hx    Stomach cancer Neg Hx    Rectal cancer Neg Hx     Social History   Socioeconomic History   Marital status: Widowed    Spouse name: Not on file   Number of children: 1   Years of education: Not on file   Highest education level: 12th grade  Occupational History   Occupation: Retired  Tobacco Use   Smoking status: Never   Smokeless tobacco: Never  Vaping Use   Vaping status: Never Used  Substance and Sexual Activity   Alcohol use: No   Drug use: No   Sexual activity: Not Currently    Birth control/protection: Post-menopausal  Other Topics Concern   Not on file  Social History Narrative   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   Single   1 daughter- lives in [place]   2 dogs   Enjoys reading, watching television   Pt is right handed   Drinks coffee once a week, green tea daily, soda sometimes    Social Drivers of Corporate investment banker Strain: Medium Risk  (07/05/2024)   Overall Financial Resource Strain (CARDIA)    Difficulty of Paying Living Expenses: Somewhat hard  Food Insecurity: No Food Insecurity (07/05/2024)   Hunger Vital Sign    Worried About Running Out of Food in the Last Year: Never true    Ran Out of Food in the Last Year: Never true  Transportation Needs: No Transportation Needs (07/05/2024)   PRAPARE - Administrator, Civil Service (Medical): No    Lack of Transportation (Non-Medical): No  Physical Activity: Insufficiently Active (07/05/2024)   Exercise Vital Sign    Days of Exercise per Week: 1 day    Minutes of Exercise per Session: 10 min  Stress: No Stress Concern Present (07/05/2024)   [employer] of Occupational Health - Occupational Stress Questionnaire    Feeling of Stress: Only a little  Recent Concern: Stress - Stress Concern Present (06/07/2024)   [employer] of Occupational Health - Occupational Stress Questionnaire    Feeling of Stress: To some extent  Social Connections: Socially Isolated (07/05/2024)   Social Connection and Isolation Panel    Frequency of Communication with Friends and Family: Once a week    Frequency of Social Gatherings with Friends and Family: Once a week    Attends Religious Services: 1 to 4 times per year    Active Member of [employer] or Organizations: No    Attends Banker Meetings: Not on file    Marital Status: Widowed  Intimate Partner Violence: Not At Risk (11/18/2023)   Humiliation, Afraid, Rape, and Kick questionnaire    Fear of Current or Ex-Partner: No    Emotionally Abused: No    Physically Abused: No    Sexually Abused: No    Outpatient Medications Prior to Visit  Medication Sig Dispense Refill   amoxicillin -clavulanate (AUGMENTIN ) 875-125 MG tablet Take 1 tablet by mouth 2 (two) times daily. 20 tablet 0   busPIRone  (BUSPAR ) 15 MG tablet TAKE 1 TABLET(15 MG) BY MOUTH TWICE DAILY (Patient taking differently: Take 15 mg by mouth daily.)  180 tablet 1   escitalopram  (LEXAPRO ) 10 MG tablet Take 1/2 tablet by mouth once daily for 1 week, then increase to a full tab once daily on week two. 90 tablet 0   esomeprazole  (NEXIUM ) 40 MG capsule Take 1 tablet by mouth twice daily for 1 month,  then return to once daily. 120 capsule 0   levETIRAcetam  (KEPPRA ) 500 MG tablet TAKE 2 TABLETS(1000 MG) BY MOUTH TWICE DAILY 120 tablet 5   MYRBETRIQ  25 MG TB24 tablet TAKE 1 TABLET(25 MG) BY MOUTH DAILY 90 tablet 1   nitroGLYCERIN  (NITROSTAT ) 0.4 MG SL tablet Place 0.4 mg under the tongue every 5 (five) minutes as needed for chest pain.     RESTASIS  0.05 % ophthalmic emulsion Place 1 drop into both eyes 2 (two) times daily.     traMADol  (ULTRAM ) 50 MG tablet Take 1 tablet (50 mg total) by mouth every 6 (six) hours as needed for moderate pain (pain score 4-6) or severe pain (pain score 7-10). 15 tablet 0   No facility-administered medications prior to visit.    Allergies  Allergen Reactions   Naratriptan      Ineffective    ROS See HPI    Objective:    Physical Exam  Wt 140 lb (63.5 kg)   BMI 24.03 kg/m  Wt Readings from Last 3 Encounters:  07/07/24 140 lb (63.5 kg)  06/09/24 141 lb (64 kg)  04/02/24 134 lb (60.8 kg)  Weight 140  Gen: Awake, alert, no acute distress Resp: Breathing is even and non-labored Psych: calm/pleasant demeanor Neuro: Alert and Oriented x 3, + facial symmetry, speech is clear.     Assessment & Plan:   Problem List Items Addressed This Visit       Unprioritized   Weight loss   This has resolved and has been stable with the removal of remeron .       GERD (gastroesophageal reflux disease)   Globus sensation and gerd sx's have resolved. Advised pt to continue nexium  bid for 1 more month then decrease to once daily.       Depression with anxiety - Primary   Improved with addition of lexapro . Continue lexapro  along with Buspar .        I am having Lindsay HUNT Garr maintain her nitroGLYCERIN ,  Restasis , busPIRone , Myrbetriq , traMADol , amoxicillin -clavulanate, levETIRAcetam , esomeprazole , and escitalopram .  No orders of the defined types were placed in this encounter.   I discussed the assessment and treatment plan with the patient. The patient was provided an opportunity to ask questions and all were answered. The patient agreed with the plan and demonstrated an understanding of the instructions.   The patient was advised to call back or seek an in-person evaluation if the symptoms worsen or if the condition fails to improve as anticipated.  Eleanor GORMAN Ponto, NP [facility] [phone] (phone) [phone] (fax)  [facility]

## 2024-07-09 ENCOUNTER — Other Ambulatory Visit: Payer: Self-pay | Admitting: Family

## 2024-09-03 ENCOUNTER — Telehealth: Admitting: Family Medicine

## 2024-09-03 DIAGNOSIS — J019 Acute sinusitis, unspecified: Secondary | ICD-10-CM

## 2024-09-03 DIAGNOSIS — B9689 Other specified bacterial agents as the cause of diseases classified elsewhere: Secondary | ICD-10-CM

## 2024-09-03 MED ORDER — AMOXICILLIN-POT CLAVULANATE 875-125 MG PO TABS: 1.0000 | ORAL_TABLET | Freq: Two times a day (BID) | ORAL | 0 refills | Status: AC

## 2024-09-03 NOTE — Progress Notes (Signed)
 E-Visit for Sinus Problems  We are sorry that you are not feeling well.  Here is how we plan to help!  Based on what you have shared with me it looks like you have sinusitis.  Sinusitis is inflammation and infection in the sinus cavities of the head.  Based on your presentation I believe you most likely have Acute Bacterial Sinusitis.  This is an infection caused by bacteria and is treated with antibiotics. I have prescribed Augmentin  875mg /125mg  one tablet twice daily with food, for 7 days. You may use an oral decongestant such as Mucinex D or if you have glaucoma or high blood pressure use plain Mucinex. Saline nasal spray help and can safely be used as often as needed for congestion.  If you develop worsening sinus pain, fever or notice severe headache and vision changes, or if symptoms are not better after completion of antibiotic, please schedule an appointment with a health care provider.    Sinus infections are not as easily transmitted as other respiratory infection, however we still recommend that you avoid close contact with loved ones, especially the very young and elderly.  Remember to wash your hands thoroughly throughout the day as this is the number one way to prevent the spread of infection!  Home Care: Only take medications as instructed by your medical team. Complete the entire course of an antibiotic. Do not take these medications with alcohol. A steam or ultrasonic humidifier can help congestion.  You can place a towel over your head and breathe in the steam from hot water coming from a faucet. Avoid close contacts especially the very young and the elderly. Cover your mouth when you cough or sneeze. Always remember to wash your hands.  Get Help Right Away If: You develop worsening fever or sinus pain. You develop a severe head ache or visual changes. Your symptoms persist after you have completed your treatment plan.  Make sure you Understand these instructions. Will  watch your condition. Will get help right away if you are not doing well or get worse.  Your e-visit answers were reviewed by a board certified advanced clinical practitioner to complete your personal care plan.  Depending on the condition, your plan could have included both over the counter or prescription medications.  If there is a problem please reply  once you have received a response from your provider.  Your safety is important to us .  If you have drug allergies check your prescription carefully.    You can use MyChart to ask questions about today's visit, request a non-urgent call back, or ask for a work or school excuse for 24 hours related to this e-Visit. If it has been greater than 24 hours you will need to follow up with your provider, or enter a new e-Visit to address those concerns.  You will get an e-mail in the next two days asking about your experience.  I hope that your e-visit has been valuable and will speed your recovery. Thank you for using e-visits.  I have spent 5 minutes in review of e-visit questionnaire, review and updating patient chart, medical decision making and response to patient.   Tavio Biegel, FNP

## 2024-09-10 ENCOUNTER — Other Ambulatory Visit: Payer: Self-pay | Admitting: Family

## 2024-09-10 DIAGNOSIS — F418 Other specified anxiety disorders: Secondary | ICD-10-CM

## 2024-10-11 ENCOUNTER — Other Ambulatory Visit: Payer: Self-pay | Admitting: Family

## 2024-10-11 NOTE — Telephone Encounter (Signed)
 Please contact pt to schedule follow up some time in March.

## 2024-10-12 NOTE — Telephone Encounter (Signed)
 "  Got pt scheduled  "

## 2024-11-23 ENCOUNTER — Ambulatory Visit

## 2024-12-01 ENCOUNTER — Ambulatory Visit: Admitting: Family

## 2025-02-15 ENCOUNTER — Ambulatory Visit: Admitting: Neurology
